# Patient Record
Sex: Female | Born: 1975 | Race: Black or African American | Hispanic: No | Marital: Married | Smoking: Current every day smoker
Health system: Southern US, Community
[De-identification: ages and names within clinical notes are randomized; demographics above are authoritative.]

## PROBLEM LIST (undated history)

## (undated) DIAGNOSIS — Z973 Presence of spectacles and contact lenses: Secondary | ICD-10-CM

## (undated) DIAGNOSIS — E669 Obesity, unspecified: Secondary | ICD-10-CM

## (undated) DIAGNOSIS — R519 Headache, unspecified: Secondary | ICD-10-CM

## (undated) DIAGNOSIS — F909 Attention-deficit hyperactivity disorder, unspecified type: Secondary | ICD-10-CM

## (undated) DIAGNOSIS — M797 Fibromyalgia: Secondary | ICD-10-CM

## (undated) DIAGNOSIS — Z8669 Personal history of other diseases of the nervous system and sense organs: Secondary | ICD-10-CM

## (undated) DIAGNOSIS — K219 Gastro-esophageal reflux disease without esophagitis: Secondary | ICD-10-CM

## (undated) DIAGNOSIS — F419 Anxiety disorder, unspecified: Secondary | ICD-10-CM

## (undated) DIAGNOSIS — L732 Hidradenitis suppurativa: Secondary | ICD-10-CM

## (undated) DIAGNOSIS — E119 Type 2 diabetes mellitus without complications: Secondary | ICD-10-CM

## (undated) DIAGNOSIS — E039 Hypothyroidism, unspecified: Secondary | ICD-10-CM

## (undated) DIAGNOSIS — E785 Hyperlipidemia, unspecified: Secondary | ICD-10-CM

## (undated) DIAGNOSIS — F32A Depression, unspecified: Secondary | ICD-10-CM

## (undated) DIAGNOSIS — M1990 Unspecified osteoarthritis, unspecified site: Secondary | ICD-10-CM

## (undated) DIAGNOSIS — G4730 Sleep apnea, unspecified: Secondary | ICD-10-CM

## (undated) DIAGNOSIS — I10 Essential (primary) hypertension: Secondary | ICD-10-CM

## (undated) DIAGNOSIS — D649 Anemia, unspecified: Secondary | ICD-10-CM

## (undated) DIAGNOSIS — F319 Bipolar disorder, unspecified: Secondary | ICD-10-CM

## (undated) HISTORY — PX: CARPAL TUNNEL RELEASE: SHX101

## (undated) HISTORY — PX: TUBAL LIGATION: SHX77

## (undated) HISTORY — PX: RECTOVAGINAL FISTULA CLOSURE: SUR265

## (undated) HISTORY — DX: Sleep apnea, unspecified: G47.30

## (undated) HISTORY — PX: VAGINA SURGERY: SHX829

## (undated) HISTORY — PX: TONSILLECTOMY: SUR1361

## (undated) HISTORY — DX: Bipolar disorder, unspecified: F31.9

## (undated) HISTORY — PX: GANGLION CYST EXCISION: SHX1691

## (undated) HISTORY — DX: Type 2 diabetes mellitus without complications: E11.9

## (undated) HISTORY — PX: ELBOW SURGERY: SHX618

---

## 2006-07-06 HISTORY — PX: TUBAL LIGATION: SHX77

## 2007-04-08 ENCOUNTER — Emergency Department (HOSPITAL_COMMUNITY): Admission: EM | Admit: 2007-04-08 | Discharge: 2007-04-08 | Payer: Self-pay | Admitting: Family Medicine

## 2007-05-03 ENCOUNTER — Emergency Department (HOSPITAL_COMMUNITY): Admission: EM | Admit: 2007-05-03 | Discharge: 2007-05-03 | Payer: Self-pay | Admitting: Emergency Medicine

## 2007-07-11 ENCOUNTER — Emergency Department (HOSPITAL_COMMUNITY): Admission: EM | Admit: 2007-07-11 | Discharge: 2007-07-11 | Payer: Self-pay | Admitting: Family Medicine

## 2007-09-08 ENCOUNTER — Emergency Department (HOSPITAL_COMMUNITY): Admission: EM | Admit: 2007-09-08 | Discharge: 2007-09-08 | Payer: Self-pay | Admitting: Emergency Medicine

## 2008-06-12 ENCOUNTER — Ambulatory Visit (HOSPITAL_BASED_OUTPATIENT_CLINIC_OR_DEPARTMENT_OTHER): Admission: RE | Admit: 2008-06-12 | Discharge: 2008-06-12 | Payer: Self-pay | Admitting: Orthopedic Surgery

## 2008-06-26 ENCOUNTER — Ambulatory Visit (HOSPITAL_BASED_OUTPATIENT_CLINIC_OR_DEPARTMENT_OTHER): Admission: RE | Admit: 2008-06-26 | Discharge: 2008-06-26 | Payer: Self-pay | Admitting: Orthopedic Surgery

## 2008-07-19 ENCOUNTER — Encounter: Admission: RE | Admit: 2008-07-19 | Discharge: 2008-09-20 | Payer: Self-pay | Admitting: Orthopedic Surgery

## 2008-11-19 ENCOUNTER — Emergency Department (HOSPITAL_COMMUNITY): Admission: EM | Admit: 2008-11-19 | Discharge: 2008-11-19 | Payer: Self-pay | Admitting: Emergency Medicine

## 2008-12-13 ENCOUNTER — Emergency Department (HOSPITAL_COMMUNITY): Admission: EM | Admit: 2008-12-13 | Discharge: 2008-12-13 | Payer: Self-pay | Admitting: Emergency Medicine

## 2009-02-20 ENCOUNTER — Encounter: Admission: RE | Admit: 2009-02-20 | Discharge: 2009-02-20 | Payer: Self-pay | Admitting: Neurology

## 2009-04-01 ENCOUNTER — Encounter: Payer: Self-pay | Admitting: Pulmonary Disease

## 2009-05-17 ENCOUNTER — Encounter: Payer: Self-pay | Admitting: Pulmonary Disease

## 2009-05-17 ENCOUNTER — Ambulatory Visit (HOSPITAL_BASED_OUTPATIENT_CLINIC_OR_DEPARTMENT_OTHER): Admission: RE | Admit: 2009-05-17 | Discharge: 2009-05-17 | Payer: Self-pay | Admitting: Neurology

## 2009-05-27 ENCOUNTER — Ambulatory Visit: Payer: Self-pay | Admitting: Pulmonary Disease

## 2009-06-20 DIAGNOSIS — G5600 Carpal tunnel syndrome, unspecified upper limb: Secondary | ICD-10-CM | POA: Insufficient documentation

## 2009-06-20 DIAGNOSIS — R51 Headache: Secondary | ICD-10-CM | POA: Insufficient documentation

## 2009-06-20 DIAGNOSIS — G5710 Meralgia paresthetica, unspecified lower limb: Secondary | ICD-10-CM | POA: Insufficient documentation

## 2009-06-20 DIAGNOSIS — I10 Essential (primary) hypertension: Secondary | ICD-10-CM | POA: Insufficient documentation

## 2009-06-20 DIAGNOSIS — R519 Headache, unspecified: Secondary | ICD-10-CM | POA: Insufficient documentation

## 2009-06-20 DIAGNOSIS — E669 Obesity, unspecified: Secondary | ICD-10-CM | POA: Insufficient documentation

## 2009-06-20 DIAGNOSIS — IMO0001 Reserved for inherently not codable concepts without codable children: Secondary | ICD-10-CM | POA: Insufficient documentation

## 2009-06-21 ENCOUNTER — Ambulatory Visit: Payer: Self-pay | Admitting: Pulmonary Disease

## 2009-06-21 DIAGNOSIS — F518 Other sleep disorders not due to a substance or known physiological condition: Secondary | ICD-10-CM | POA: Insufficient documentation

## 2009-06-21 DIAGNOSIS — F319 Bipolar disorder, unspecified: Secondary | ICD-10-CM | POA: Insufficient documentation

## 2009-06-21 DIAGNOSIS — E119 Type 2 diabetes mellitus without complications: Secondary | ICD-10-CM

## 2009-06-21 DIAGNOSIS — G4700 Insomnia, unspecified: Secondary | ICD-10-CM | POA: Insufficient documentation

## 2009-06-21 DIAGNOSIS — E039 Hypothyroidism, unspecified: Secondary | ICD-10-CM | POA: Insufficient documentation

## 2009-06-21 DIAGNOSIS — G4733 Obstructive sleep apnea (adult) (pediatric): Secondary | ICD-10-CM | POA: Insufficient documentation

## 2009-06-21 DIAGNOSIS — E1142 Type 2 diabetes mellitus with diabetic polyneuropathy: Secondary | ICD-10-CM | POA: Insufficient documentation

## 2009-08-26 ENCOUNTER — Emergency Department (HOSPITAL_COMMUNITY): Admission: EM | Admit: 2009-08-26 | Discharge: 2009-08-26 | Payer: Self-pay | Admitting: Emergency Medicine

## 2009-09-07 ENCOUNTER — Emergency Department (HOSPITAL_COMMUNITY): Admission: EM | Admit: 2009-09-07 | Discharge: 2009-09-07 | Payer: Self-pay | Admitting: Emergency Medicine

## 2009-11-01 ENCOUNTER — Emergency Department (HOSPITAL_COMMUNITY): Admission: EM | Admit: 2009-11-01 | Discharge: 2009-11-01 | Payer: Self-pay | Admitting: Family Medicine

## 2009-12-26 ENCOUNTER — Emergency Department (HOSPITAL_COMMUNITY): Admission: EM | Admit: 2009-12-26 | Discharge: 2009-12-26 | Payer: Self-pay | Admitting: Emergency Medicine

## 2010-01-28 ENCOUNTER — Emergency Department (HOSPITAL_COMMUNITY): Admission: EM | Admit: 2010-01-28 | Discharge: 2010-01-28 | Payer: Self-pay | Admitting: Emergency Medicine

## 2010-04-07 ENCOUNTER — Emergency Department (HOSPITAL_COMMUNITY): Admission: EM | Admit: 2010-04-07 | Discharge: 2010-04-07 | Payer: Self-pay | Admitting: Emergency Medicine

## 2010-04-07 ENCOUNTER — Emergency Department (HOSPITAL_COMMUNITY): Admission: EM | Admit: 2010-04-07 | Discharge: 2010-04-07 | Payer: Self-pay | Admitting: Family Medicine

## 2010-06-13 ENCOUNTER — Encounter
Admission: RE | Admit: 2010-06-13 | Discharge: 2010-06-13 | Payer: Self-pay | Source: Home / Self Care | Attending: Family Medicine | Admitting: Family Medicine

## 2010-07-17 ENCOUNTER — Emergency Department (HOSPITAL_COMMUNITY)
Admission: EM | Admit: 2010-07-17 | Discharge: 2010-07-17 | Payer: Self-pay | Source: Home / Self Care | Admitting: Emergency Medicine

## 2010-07-22 ENCOUNTER — Emergency Department (HOSPITAL_COMMUNITY)
Admission: EM | Admit: 2010-07-22 | Discharge: 2010-07-22 | Payer: Self-pay | Source: Home / Self Care | Admitting: Emergency Medicine

## 2010-08-04 ENCOUNTER — Other Ambulatory Visit: Payer: Self-pay | Admitting: Internal Medicine

## 2010-08-04 DIAGNOSIS — E041 Nontoxic single thyroid nodule: Secondary | ICD-10-CM

## 2010-08-12 ENCOUNTER — Other Ambulatory Visit (HOSPITAL_COMMUNITY)
Admission: RE | Admit: 2010-08-12 | Discharge: 2010-08-12 | Disposition: A | Discharge status: Home / Self Care | Payer: Medicaid Other | Source: Ambulatory Visit | Attending: Interventional Radiology | Admitting: Interventional Radiology

## 2010-08-12 ENCOUNTER — Ambulatory Visit
Admission: RE | Admit: 2010-08-12 | Discharge: 2010-08-12 | Disposition: A | Discharge status: Home / Self Care | Payer: Medicaid Other | Source: Ambulatory Visit | Attending: Internal Medicine | Admitting: Internal Medicine

## 2010-08-12 ENCOUNTER — Other Ambulatory Visit: Payer: Self-pay | Admitting: Interventional Radiology

## 2010-08-12 DIAGNOSIS — E041 Nontoxic single thyroid nodule: Secondary | ICD-10-CM

## 2010-08-12 DIAGNOSIS — E049 Nontoxic goiter, unspecified: Secondary | ICD-10-CM | POA: Insufficient documentation

## 2010-09-01 ENCOUNTER — Other Ambulatory Visit: Payer: Self-pay | Admitting: Neurology

## 2010-09-01 DIAGNOSIS — R51 Headache: Secondary | ICD-10-CM

## 2010-09-01 DIAGNOSIS — R209 Unspecified disturbances of skin sensation: Secondary | ICD-10-CM

## 2010-09-01 DIAGNOSIS — H538 Other visual disturbances: Secondary | ICD-10-CM

## 2010-09-10 ENCOUNTER — Ambulatory Visit
Admission: RE | Admit: 2010-09-10 | Discharge: 2010-09-10 | Disposition: A | Discharge status: Home / Self Care | Payer: Medicaid Other | Source: Ambulatory Visit | Attending: Neurology | Admitting: Neurology

## 2010-09-10 DIAGNOSIS — H538 Other visual disturbances: Secondary | ICD-10-CM

## 2010-09-10 DIAGNOSIS — R209 Unspecified disturbances of skin sensation: Secondary | ICD-10-CM

## 2010-09-10 DIAGNOSIS — R51 Headache: Secondary | ICD-10-CM

## 2010-10-13 LAB — POCT I-STAT, CHEM 8
Calcium, Ion: 1.11 mmol/L — ABNORMAL LOW (ref 1.12–1.32)
Glucose, Bld: 88 mg/dL (ref 70–99)
HCT: 35 % — ABNORMAL LOW (ref 36.0–46.0)
Hemoglobin: 11.9 g/dL — ABNORMAL LOW (ref 12.0–15.0)
TCO2: 25 mmol/L (ref 0–100)

## 2010-11-18 NOTE — Op Note (Signed)
Ruth [name], Ruth [name]            ACCOUNT NO.:  [id number]   MEDICAL RECORD NO.:  [id number]          PATIENT TYPE:  AMB   LOCATION:  [facility]                         FACILITY:  [facility]   PHYSICIAN:  Deidre Ala, M.D.    DATE OF BIRTH:  08/03/1975   DATE OF PROCEDURE:  06/12/2008  DATE OF DISCHARGE:                               OPERATIVE REPORT   PREOPERATIVE DIAGNOSES:  1. 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  2. Chronic medial epicondylitis.   POSTOPERATIVE DIAGNOSES:  1. 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  2. Chronic medial epicondylitis.   PROCEDURE:  1. Left elbow Jobe procedure with partial medial epicondylectomy and      repair of common flexor mass.  2. Left elbow neurolysis of ulnar nerve at cubital tunnel.   SURGEON:  1. Charlesetta Shanks, M.D.   ASSISTANT:  Phineas Semen, PA-C.   ANESTHESIA:  General with LMA.   CULTURES:  None.   DRAINS:  None.   ESTIMATED BLOOD LOSS:  Minimal.   TOURNIQUET TIME:  Thirty minutes.   PATHOLOGIC FINDINGS AND HISTORY:  Fani was sent by Dr. Bruna Potter  complaining of bilateral wrist and forearm pain, right worse than left.  We felt at first she had bilateral carpal tunnel syndrome, tendinitis of  the right hand and radial tunnel syndrome bilaterally.  We sent her to  Dr. Regino Schultze for nerve conduction studies which showed abnormalities in the  left ulnar nerve at the elbow, right radial mononeuropathy at the  posterior interosseous area for radial tunnel and a right median  mononeuropathy with compression at the wrist.  The left medial sensory  conduction was normal, although there was slightly delayed distal  latency.  She was persistently having discomfort.  She wanted to do the  surgery on the left side.  She wanted to wait to do the right side until  her mother with here on vacation to help around the house.  She  therefore elected to proceed because of marked tenderness at the  medial  epicondyle and the ulnar nerve findings to do the left elbow first.  Two  reasons for doing the medial epicondylectomy are in my algorithm, I  believe that the medial epicondyle is often the entrapping agent and  that medial epicondylectomy decompresses the nerve without having to do  an anterior transposition or submuscular transposition which I think  devascularizes the nerve.  In addition, she was exquisitely tender over  the common flexor mass and, therefore, this combined with a Jobe  technique, release of the common flexor mass, excision of scar tissue  and epicondylectomy to freshen the bed and then repair somewhat similar  to a Nirschl type procedure.  The nerve was pinched at its classic  entrance into the cubital tunnel.  Epineurectomy was carried out to  release it.  It was released proximal and distal, as well as the medial  intermuscular septum.  Care was taken to protect the medial antebrachial  cutaneous nerves.  The nerve was free and not tethered in anyway after  the epicondylectomy partial and repair with  flexion and extension of the  elbow.  No excessive stripping of the vascularity to the nerve was  undertaken, only straightforward neurolysis and medial epineurectomy.   DESCRIPTION OF PROCEDURE:  With adequate anesthesia obtained using LMA  technique, the patient was placed in a supine position and the left  upper extremity was prepped from the fingertips to the tourniquet in the  standard fashion.  After standard prepping and draping, Esmarch  examination was used and the tourniquet was let up to 300 mmHg.  An  incision was then made over the medial epicondyle slightly curvilinear.  Incision deepened sharply with a knife and hemostasis obtained using the  Bovie electrocoagulator.  Under loupe magnification dissection was  carried down through the subcutaneous tissue to the fascia.  The fascia  over the nerve was released in the cubital tunnel proximal and  distally  and careful neurolysis carried out with release of the medial  intermuscular septum and all tight bands around the nerve, including the  epineurium well up into the arm and distally into the flexor mass.  I  then incised around the soft tissues over top of the common flexor mass  at the medial epicondyle, reflected it up and off, retracted it, then  did a medial epicondylectomy partial using a curved osteotome and  rongeurs.  When I felt it had been adequately decompressed so as not to  catch the nerve and to freshen the base of cortical bone,  irrigation  was carried out.  The common flexor mass was repaired by interrupted  figure-of-eight #0 Vicryl sutures, repairing the common flexor mass back  to the medial epicondyle remaining.  The nerve was then checked through  range of motion.  Irrigation was carried out.  The wound was then closed  in layers with 2-0 and 3-0 Vicryl in the subcu and skin staples.  A  bulky sterile compressive dressing was applied without plaster with  sling.  The patient having tolerated the procedure well was awakened and  taken to the recovery room in satisfactory condition to be discharged  per outpatient routine.  Given Percocet for pain.  Told call the office  for an appointment this weekend.  She was given a sling.  Laboratory  data within normal limits.        Deidre Ala, M.D.  Electronically Signed     VEP/MEDQ  D:  06/12/2008  T:  06/12/2008  Job:  [id number]   cc:   Burtis Junes, MD   [facility]  [address]  [place], [place] [address]

## 2010-11-18 NOTE — Op Note (Signed)
Ruth [name], Ruth [name]            ACCOUNT NO.:  [id number]   MEDICAL RECORD NO.:  [id number]          PATIENT TYPE:  AMB   LOCATION:  [facility]                         [facility]ACILITY:  [facility]   PHYSICIAN:  Deidre Ala, M.D.    DATE OF BIRTH:  05-14-1976   DATE OF PROCEDURE:  06/26/2008  DATE OF DISCHARGE:                               OPERATIVE REPORT   PREOPERATIVE DIAGNOSES:  1. Right forearm radial tunnel syndrome compression.  2. Right wrist dorsal ganglion cyst confirmed by ultrasound.  3. Right carpal tunnel syndrome.   POSTOPERATIVE DIAGNOSES:  1. Right radial tunnel compression syndrome.  2. Right dorsal wrist ganglion cyst with hemorrhage and intra-      articular component, classic position.  3. Extensor tenosynovitis, third and fourth dorsal compartments.  4. Right carpal tunnel syndrome.   PROCEDURE:  1. Right elbow radial tunnel release with technique of Mariella Saa  2. Excision of right dorsal wrist ganglion cyst with intra-articular      component and capsular excision and cautery.  3. Extensor tenosynovectomy, third and fourth dorsal compartments.  4. Right carpal tunnel release.   SURGEON:  1. Charlesetta Shanks, M.D.   ASSISTANT:  Phineas Semen, P.A.   ANESTHESIA:  General with LMA   CULTURES:  None.   DRAINS:  None.   ESTIMATED BLOOD LOSS:  Minimal.   TOURNIQUET TIME:  1 hour 30 minutes.   SPECIMEN:  Right wrist ganglion with hemorrhage.   PATHOLOGIC FINDINGS AND HISTORY:  When Ruth [name] first presented  to [place] May 01, 2007, with Dr. Bruna Potter, she was complaining of  bilateral wrist pain and it was shooting going up her arm, right worse  than left.  She also had pain over the right radial dorsal aspect of the  wrist.  We examined her and there was a mild prominence over the dorsal  surface of the wrist which was very tender, not a discrete palpable mass  at that point. She did have Tinel's sign over the right radial tunnel  and we had findings of  carpal tunnel syndrome.  Our initial impression  was bilateral carpal tunnel syndrome, tendinitis dorsal surface of the  right hand and radial tunnel syndrome.  We sent her to Dr. Modesto Charon for  nerve conduction studies and what it showed was right radial  mononeuropathy with compression at or above the elbow and posterior  interosseous neuropathy or radial tunnel syndrome and borderline right  median mononeuropathy with compression at the wrist.  She also had ulnar  nerve neuropathy on the left side and proceeded with that surgical  procedure to release it on June 12, 2008 and did well with that. She  then presented for procedure on the right side.  We had planned to do a  radial tunnel release and carpal tunnel release.  She again complained  of her wrist.  We did an ultrasound in the office that was read by  radiology at [facility] as well as our ultrasonographer referral and they felt  it was consistent with a ganglion cyst of the wrist.  At surgery on the  radial tunnel, she  had a classic compression of the nerve just past the  arcade of Frohse and leash of Sherilyn Cooter in the proximal superficial head of  the supinator. She had a very, very thick muscular forearm and it was  extremely tight.  This released well, releasing the whole superficial  head of the supinator and the overlying ER CV was quite tight and that  was also released on the dorsal aspect.  This released the nerve well.  At the distal wrist, she had fairly extensive tenosynovitis of the third  and fourth compartments which may have been what was showing up on the  ultrasound scan.  She did indeed have a pea-size ganglion in the classic  position off the capsule at the bald head of the capitate in the classic  a dorsal capsular position.  It had multiple lobes and had hemorrhage  and then deep to that were several intra-articular deep blebs.  This was  excised along with a postage stamp size area of capsule to prevent  recurrence and  this was cauterized.  The carpal tunnel was extremely  tight.  She is a very meaty palm and we did release it well, well up to  the distal forearm, releasing all branches including the motor branch.   PROCEDURE:  With adequate anesthesia obtained using LMA technique,  vancomycin given IV prophylaxis due to penicillin allergy.  The patient  was placed in the supine position.  The right upper extremity was  prepped from the fingertips to the tourniquet in standard fashion. After  standard prepping and draping, Esmarch exsanguination was used. The  tourniquet was let up to 300 mmHg.  We then started four fingerbreadths  from the lateral epicondyle and made our incision along the dorsal  aspect of the forearm in the area of the radial tunnel in the manner of  Mariella Saa.  Incision was deepened sharply with a knife and hemostasis  obtained using the Bovie electrocoagulator.  Under loupe magnification,  dissection was carried down to the extensor musculature and the fascia  was incised longitudinally.  We then finger dissected down to the  supinator. Retractors were placed. I then dissected the supinator  retrograde, coming upon the nerve and releasing it, passed proximally  the arcade of Frohse and the leash of Sherilyn Cooter, decompressing the nerve.  I  further released it distally, releasing the whole superficial head of  the supinator.  I also released the leading edge of the extensor carpi  radialis brevis on top which was extremely tight.  Irrigation was  carried out. That wound was closed in layers with running locking 0  Vicryl on the fascia, 2-0 and 3-0 Vicryl on the subcuticular, and 4-0  nylon on the skin.  Attention was then turned to the dorsal wrist, where  an incision was made where she was palpably tender with palpable mass  effect on the radial border of the distal radius.  Dissection was  carried down to the third and fourth compartment intervals with tendons  identified.   Tenosynovitis was excised.  I then further dissected down  to the capsule and located the ganglion cyst and removed it as well as  the intra-articular component.  The capsulectomy edges were then  cauterized to prevent recurrence.  Irrigation was carried out and that  wound was closed with 3-0 Vicryl and 4-0 nylon.  We then turned the hand  over and made the classic carpal tunnel incision in mid palm and thenar  flexion crease longitudinally  from proximal one-third palm to the distal  wrist flexion crease. Incision was deepened sharply with a knife and  hemostasis obtained using the Bovie electrocoagulator.  Dissection was  carried down through to the palmar fascia.  I then placed a Freer  elevator to protect the nerve and cut down upon the transverse carpal  ligament and the fascia with a 64 Beaver blade.  This exposed the nerve.  I then released the nerve well up into the distal forearm on the ulnar  side using a Sewall retractor.  All branches were traced distally  including the motor branch and the epineurium was released.  Irrigation  was carried out and that wound was closed with interrupted vertical  mattress sutures of 4-0 nylon.  A bulky sterile compressive dressing was  applied with volar plaster splint with 7+ gauzes to effect a partial  hand dressing.  We then  placed gauze and Webril on the forearm with Ace and a sling.  The  patient then having tolerated procedure well was awakened, taken to the  recovery room in satisfactory condition to be discharged per outpatient  routine, to keep it elevated and given Percocet for pain and told to  come to the office for recheck next Tuesday.      Deidre Ala, M.D.  Electronically Signed     VEP/MEDQ  D:  06/26/2008  T:  06/26/2008  Job:  [id number]   cc:   A.V. Bruna Potter, MD

## 2011-01-14 ENCOUNTER — Inpatient Hospital Stay (INDEPENDENT_AMBULATORY_CARE_PROVIDER_SITE_OTHER)
Admission: RE | Admit: 2011-01-14 | Discharge: 2011-01-14 | Disposition: A | Discharge status: Home / Self Care | Payer: Medicaid Other | Source: Ambulatory Visit | Attending: Family Medicine | Admitting: Family Medicine

## 2011-01-14 DIAGNOSIS — N3090 Cystitis, unspecified without hematuria: Secondary | ICD-10-CM

## 2011-01-14 LAB — POCT URINALYSIS DIP (DEVICE)
Nitrite: NEGATIVE
Protein, ur: NEGATIVE mg/dL

## 2011-03-22 ENCOUNTER — Emergency Department (HOSPITAL_COMMUNITY)
Admission: EM | Admit: 2011-03-22 | Discharge: 2011-03-22 | Disposition: A | Discharge status: Home / Self Care | Payer: Medicaid Other | Attending: Emergency Medicine | Admitting: Emergency Medicine

## 2011-03-22 DIAGNOSIS — E119 Type 2 diabetes mellitus without complications: Secondary | ICD-10-CM | POA: Insufficient documentation

## 2011-03-22 DIAGNOSIS — IMO0001 Reserved for inherently not codable concepts without codable children: Secondary | ICD-10-CM | POA: Insufficient documentation

## 2011-03-22 DIAGNOSIS — Z79899 Other long term (current) drug therapy: Secondary | ICD-10-CM | POA: Insufficient documentation

## 2011-03-22 DIAGNOSIS — E039 Hypothyroidism, unspecified: Secondary | ICD-10-CM | POA: Insufficient documentation

## 2011-03-22 DIAGNOSIS — F319 Bipolar disorder, unspecified: Secondary | ICD-10-CM | POA: Insufficient documentation

## 2011-03-22 DIAGNOSIS — I10 Essential (primary) hypertension: Secondary | ICD-10-CM | POA: Insufficient documentation

## 2011-03-22 DIAGNOSIS — Z87891 Personal history of nicotine dependence: Secondary | ICD-10-CM | POA: Insufficient documentation

## 2011-03-22 DIAGNOSIS — E7800 Pure hypercholesterolemia, unspecified: Secondary | ICD-10-CM | POA: Insufficient documentation

## 2011-03-22 DIAGNOSIS — R1084 Generalized abdominal pain: Secondary | ICD-10-CM | POA: Insufficient documentation

## 2011-03-22 LAB — URINALYSIS, ROUTINE W REFLEX MICROSCOPIC
Glucose, UA: NEGATIVE mg/dL
Ketones, ur: NEGATIVE mg/dL
Nitrite: NEGATIVE
pH: 7.5 (ref 5.0–8.0)

## 2011-03-22 LAB — URINE MICROSCOPIC-ADD ON

## 2011-03-22 LAB — POCT PREGNANCY, URINE: Preg Test, Ur: NEGATIVE

## 2011-03-26 ENCOUNTER — Encounter (HOSPITAL_COMMUNITY): Payer: Self-pay | Admitting: Radiology

## 2011-03-26 ENCOUNTER — Emergency Department (HOSPITAL_COMMUNITY)
Admission: EM | Admit: 2011-03-26 | Discharge: 2011-03-26 | Disposition: A | Discharge status: Home / Self Care | Payer: Medicaid Other | Attending: Emergency Medicine | Admitting: Emergency Medicine

## 2011-03-26 ENCOUNTER — Emergency Department (HOSPITAL_COMMUNITY): Payer: Medicaid Other

## 2011-03-26 DIAGNOSIS — E7800 Pure hypercholesterolemia, unspecified: Secondary | ICD-10-CM | POA: Insufficient documentation

## 2011-03-26 DIAGNOSIS — R1310 Dysphagia, unspecified: Secondary | ICD-10-CM | POA: Insufficient documentation

## 2011-03-26 DIAGNOSIS — E119 Type 2 diabetes mellitus without complications: Secondary | ICD-10-CM | POA: Insufficient documentation

## 2011-03-26 DIAGNOSIS — F319 Bipolar disorder, unspecified: Secondary | ICD-10-CM | POA: Insufficient documentation

## 2011-03-26 DIAGNOSIS — J029 Acute pharyngitis, unspecified: Secondary | ICD-10-CM | POA: Insufficient documentation

## 2011-03-26 DIAGNOSIS — H9209 Otalgia, unspecified ear: Secondary | ICD-10-CM | POA: Insufficient documentation

## 2011-03-26 DIAGNOSIS — I10 Essential (primary) hypertension: Secondary | ICD-10-CM | POA: Insufficient documentation

## 2011-03-26 DIAGNOSIS — R51 Headache: Secondary | ICD-10-CM | POA: Insufficient documentation

## 2011-03-26 DIAGNOSIS — E039 Hypothyroidism, unspecified: Secondary | ICD-10-CM | POA: Insufficient documentation

## 2011-03-26 DIAGNOSIS — M542 Cervicalgia: Secondary | ICD-10-CM | POA: Insufficient documentation

## 2011-03-26 DIAGNOSIS — R209 Unspecified disturbances of skin sensation: Secondary | ICD-10-CM | POA: Insufficient documentation

## 2011-03-26 HISTORY — DX: Fibromyalgia: M79.7

## 2011-03-26 HISTORY — DX: Essential (primary) hypertension: I10

## 2011-03-26 LAB — POCT I-STAT, CHEM 8
BUN: 8 mg/dL (ref 6–23)
Calcium, Ion: 1.18 mmol/L (ref 1.12–1.32)
Creatinine, Ser: 0.7 mg/dL (ref 0.50–1.10)
Hemoglobin: 12.9 g/dL (ref 12.0–15.0)
Sodium: 143 mEq/L (ref 135–145)
TCO2: 25 mmol/L (ref 0–100)

## 2011-03-26 LAB — POCT URINALYSIS DIP (DEVICE)
Bilirubin Urine: NEGATIVE
Glucose, UA: NEGATIVE
Specific Gravity, Urine: 1.025
Urobilinogen, UA: 0.2
pH: 5.5

## 2011-03-26 LAB — GC/CHLAMYDIA PROBE AMP, GENITAL: GC Probe Amp, Genital: NEGATIVE

## 2011-03-26 LAB — WET PREP, GENITAL
Clue Cells Wet Prep HPF POC: NONE SEEN
Trich, Wet Prep: NONE SEEN

## 2011-03-26 LAB — POCT PREGNANCY, URINE: Preg Test, Ur: NEGATIVE

## 2011-03-26 MED ORDER — IOHEXOL 300 MG/ML  SOLN
75.0000 mL | Freq: Once | INTRAMUSCULAR | Status: AC | PRN
  Administered 2011-03-26: 75 mL via INTRAVENOUS

## 2011-03-30 LAB — I-STAT 8, (EC8 V) (CONVERTED LAB)
Acid-Base Excess: 2
BUN: 10
Chloride: 109
Chloride: 110
Glucose, Bld: 102 — ABNORMAL HIGH
HCT: 38
Hemoglobin: 12.9
Operator id: 288331
Potassium: 4.2
Potassium: 4.3
Sodium: 141
Sodium: 141
TCO2: 27
TCO2: 27
pH, Ven: 7.443 — ABNORMAL HIGH
pH, Ven: 7.446 — ABNORMAL HIGH

## 2011-04-10 LAB — BASIC METABOLIC PANEL
BUN: 15 mg/dL (ref 6–23)
Calcium: 9.3 mg/dL (ref 8.4–10.5)
Chloride: 105 mEq/L (ref 96–112)
Creatinine, Ser: 0.69 mg/dL (ref 0.4–1.2)

## 2011-04-10 LAB — GLUCOSE, CAPILLARY
Glucose-Capillary: 118 mg/dL — ABNORMAL HIGH (ref 70–99)
Glucose-Capillary: 147 mg/dL — ABNORMAL HIGH (ref 70–99)

## 2011-04-10 LAB — POCT HEMOGLOBIN-HEMACUE: Hemoglobin: 12.4 g/dL (ref 12.0–15.0)

## 2011-04-16 LAB — CULTURE, ROUTINE-ABSCESS: Culture: NO GROWTH

## 2011-07-14 ENCOUNTER — Encounter (HOSPITAL_COMMUNITY): Payer: Self-pay | Admitting: *Deleted

## 2011-07-14 ENCOUNTER — Emergency Department (HOSPITAL_COMMUNITY)
Admission: EM | Admit: 2011-07-14 | Discharge: 2011-07-14 | Disposition: A | Discharge status: Home / Self Care | Payer: Medicaid Other | Attending: Emergency Medicine | Admitting: Emergency Medicine

## 2011-07-14 DIAGNOSIS — I10 Essential (primary) hypertension: Secondary | ICD-10-CM | POA: Insufficient documentation

## 2011-07-14 DIAGNOSIS — F17200 Nicotine dependence, unspecified, uncomplicated: Secondary | ICD-10-CM | POA: Insufficient documentation

## 2011-07-14 DIAGNOSIS — L299 Pruritus, unspecified: Secondary | ICD-10-CM | POA: Insufficient documentation

## 2011-07-14 DIAGNOSIS — B372 Candidiasis of skin and nail: Secondary | ICD-10-CM

## 2011-07-14 DIAGNOSIS — J45909 Unspecified asthma, uncomplicated: Secondary | ICD-10-CM | POA: Insufficient documentation

## 2011-07-14 DIAGNOSIS — F319 Bipolar disorder, unspecified: Secondary | ICD-10-CM | POA: Insufficient documentation

## 2011-07-14 DIAGNOSIS — E119 Type 2 diabetes mellitus without complications: Secondary | ICD-10-CM | POA: Insufficient documentation

## 2011-07-14 DIAGNOSIS — IMO0002 Reserved for concepts with insufficient information to code with codable children: Secondary | ICD-10-CM | POA: Insufficient documentation

## 2011-07-14 DIAGNOSIS — L02412 Cutaneous abscess of left axilla: Secondary | ICD-10-CM

## 2011-07-14 MED ORDER — FLUCONAZOLE 150 MG PO TABS
150.0000 mg | ORAL_TABLET | ORAL | Status: AC
  Administered 2011-07-14: 150 mg via ORAL
  Filled 2011-07-14: qty 1

## 2011-07-14 MED ORDER — CLOTRIMAZOLE 1 % EX CREA: TOPICAL_CREAM | CUTANEOUS | Status: DC

## 2011-07-14 MED ORDER — HYDROCODONE-ACETAMINOPHEN 5-325 MG PO TABS
2.0000 | ORAL_TABLET | Freq: Once | ORAL | Status: AC
  Administered 2011-07-14: 2 via ORAL
  Filled 2011-07-14: qty 2

## 2011-07-14 MED ORDER — SULFAMETHOXAZOLE-TRIMETHOPRIM 800-160 MG PO TABS: 2.0000 | ORAL_TABLET | Freq: Two times a day (BID) | ORAL | Status: AC

## 2011-07-14 MED ORDER — HYDROCODONE-ACETAMINOPHEN 5-325 MG PO TABS
1.0000 | ORAL_TABLET | Freq: Four times a day (QID) | ORAL | Status: AC | PRN
Start: 2011-07-14 — End: 2011-07-24

## 2011-07-14 NOTE — ED Notes (Signed)
Pt given extra supplies for dressing changes at home.  

## 2011-07-14 NOTE — ED Notes (Signed)
Pt with large abscess under left axilla. Pt reports several day hx with worsening pain and increase in size. On exam pt with noted drainage. Pt reports she has been using warm compresses at home. Denies any fevers. Surrounding area warm to the touch. Pt placed in gown and updated on poc.

## 2011-07-14 NOTE — ED Notes (Signed)
PA at bedside to perform I&D

## 2011-07-14 NOTE — ED Notes (Signed)
Pt reports several day hx of abscess under left axillary. Pt reports placing warm compresses without any relief, reports no drainage.

## 2011-07-14 NOTE — ED Provider Notes (Signed)
History     CSN: [id number]  Arrival date & time 07/14/11  5621   First MD Initiated Contact with Patient 07/14/11 [phone]      Chief Complaint  Patient presents with  . Abscess    (Consider location/radiation/quality/duration/timing/severity/associated sxs/prior treatment) HPI Comments: Patient reports abscess to left axilla x several days with a small amount of purulent drainage.  States she gets frequent abscesses and has had multiple I&Ds.  Also, notes smaller abscess between her breasts , as well as an itchy skin, infection under her breasts.  Denies any fevers.  States she has never been diagnosed with hidradenitis suppurativa.  Patient has a past medical history significant for diabetes.  The history is provided by the patient.    Past Medical History  Diagnosis Date  . Diabetes mellitus   . Hypertension   . Asthma   . Bipolar affective disorder   . Fibromyalgia   . Hx of tonsillectomy     History reviewed. No pertinent past surgical history.  History reviewed. No pertinent family history.  History  Substance Use Topics  . Smoking status: Current Everyday Smoker  . Smokeless tobacco: Not on file  . Alcohol Use: No    OB History    Grav Para Term Preterm Abortions TAB SAB Ect Mult Living                  Review of Systems  All other systems reviewed and are negative.    Allergies  Penicillins  Home Medications  No current outpatient prescriptions on file.  There were no vitals taken for this visit.  Physical Exam  Nursing note and vitals reviewed. Constitutional: She is oriented to person, place, and time. She appears well-developed and well-nourished. No distress.  HENT:  Head: Normocephalic and atraumatic.  Neck: Neck supple.  Pulmonary/Chest: Effort normal. No respiratory distress.  Musculoskeletal: Normal range of motion.  Neurological: She is alert and oriented to person, place, and time.  Skin: She is not diaphoretic.       Left axilla  with large area of induration, small opening with purulent drainage.  Smaller scared area under right breast (medially), no fluctuance, no erythema, no warmth.  Skin under left breast flaking and moist - appearance of candidal infection.      ED Course  Procedures (including critical care time)  Labs Reviewed - No data to display No results found.  INCISION AND DRAINAGE Performed by: Rise Patience supervising PA student Consent: Verbal consent obtained. Risks and benefits: risks, benefits and alternatives were discussed Type: abscess  Body area: left axilla  Anesthesia: local infiltration  Local anesthetic: lidocaine 2% no epinephrine  Anesthetic total: 6 ml  Complexity: complex Blunt dissection, attempt to break up loculations  Drainage: bloody  Drainage amount: small   Packing material: none  Patient tolerance: Patient tolerated the procedure well with no immediate complications.     1. Abscess of left axilla   2. Candidal skin infection       MDM  Patient with hx of multiple abscesses, prior I&D in same location.  Possible hidradenitis suppurativa.  I&D attempted in ED with very little purulent drainage.  Patient started on antibiotic, instructions for warm moist compresses to encourage drainage, return for worsening condition.  Pt verbalizes understanding.  I have also given patient surgery referral for long term management.          Rise Patience, [place] 07/14/11 1135

## 2011-07-17 NOTE — ED Provider Notes (Signed)
History     CSN: [id number]  Arrival date & time 07/14/11  7829   First MD Initiated Contact with Patient 07/14/11 [phone]      Chief Complaint  Patient presents with  . Abscess    (Consider location/radiation/quality/duration/timing/severity/associated sxs/prior treatment) HPI  Past Medical History  Diagnosis Date  . Diabetes mellitus   . Hypertension   . Asthma   . Bipolar affective disorder   . Fibromyalgia   . Hx of tonsillectomy     History reviewed. No pertinent past surgical history.  History reviewed. No pertinent family history.  History  Substance Use Topics  . Smoking status: Current Everyday Smoker  . Smokeless tobacco: Not on file  . Alcohol Use: No    OB History    Grav Para Term Preterm Abortions TAB SAB Ect Mult Living                  Review of Systems  Allergies  Penicillins  Home Medications   Current Outpatient Rx  Name Route Sig Dispense Refill  . CLOTRIMAZOLE 1 % EX CREA  Apply to affected area 2 times daily 15 g 0  . HYDROCODONE-ACETAMINOPHEN 5-325 MG PO TABS Oral Take 1 tablet by mouth every 6 (six) hours as needed for pain. 15 tablet 0  . SULFAMETHOXAZOLE-TRIMETHOPRIM 800-160 MG PO TABS Oral Take 2 tablets by mouth 2 (two) times daily. 28 tablet 0    BP 141/78  Pulse 89  Temp(Src) 98.9 F (37.2 C) (Oral)  Resp 17  SpO2 98%  Physical Exam  ED Course  Procedures (including critical care time)  Labs Reviewed - No data to display No results found.   1. Abscess of left axilla   2. Candidal skin infection       MDM  Medical screening examination/treatment/procedure(s) were performed by non-physician practitioner and as supervising physician I was immediately available for consultation/collaboration.        Loren Racer, MD 07/17/11 2009

## 2011-07-18 NOTE — ED Provider Notes (Signed)
Medical screening examination/treatment/procedure(s) were performed by non-physician practitioner and as supervising physician I was immediately available for consultation/collaboration.  Maryhelen Lindler, MD 07/18/11 1629 

## 2012-01-06 ENCOUNTER — Encounter (HOSPITAL_COMMUNITY): Payer: Self-pay | Admitting: Emergency Medicine

## 2012-01-06 ENCOUNTER — Emergency Department (HOSPITAL_COMMUNITY)
Admission: EM | Admit: 2012-01-06 | Discharge: 2012-01-07 | Disposition: A | Discharge status: Home / Self Care | Payer: Medicaid Other | Attending: Emergency Medicine | Admitting: Emergency Medicine

## 2012-01-06 DIAGNOSIS — E119 Type 2 diabetes mellitus without complications: Secondary | ICD-10-CM | POA: Insufficient documentation

## 2012-01-06 DIAGNOSIS — R45851 Suicidal ideations: Secondary | ICD-10-CM | POA: Insufficient documentation

## 2012-01-06 DIAGNOSIS — J45909 Unspecified asthma, uncomplicated: Secondary | ICD-10-CM | POA: Insufficient documentation

## 2012-01-06 DIAGNOSIS — F32A Depression, unspecified: Secondary | ICD-10-CM

## 2012-01-06 DIAGNOSIS — E669 Obesity, unspecified: Secondary | ICD-10-CM | POA: Insufficient documentation

## 2012-01-06 DIAGNOSIS — F319 Bipolar disorder, unspecified: Secondary | ICD-10-CM | POA: Insufficient documentation

## 2012-01-06 DIAGNOSIS — F17200 Nicotine dependence, unspecified, uncomplicated: Secondary | ICD-10-CM | POA: Insufficient documentation

## 2012-01-06 DIAGNOSIS — I10 Essential (primary) hypertension: Secondary | ICD-10-CM | POA: Insufficient documentation

## 2012-01-06 DIAGNOSIS — F329 Major depressive disorder, single episode, unspecified: Secondary | ICD-10-CM

## 2012-01-06 LAB — CBC WITH DIFFERENTIAL/PLATELET
Basophils Absolute: 0 10*3/uL (ref 0.0–0.1)
Eosinophils Absolute: 0.1 10*3/uL (ref 0.0–0.7)
Eosinophils Relative: 2 % (ref 0–5)
MCH: 25.6 pg — ABNORMAL LOW (ref 26.0–34.0)
MCV: 77.5 fL — ABNORMAL LOW (ref 78.0–100.0)
Platelets: 341 10*3/uL (ref 150–400)
RDW: 15.8 % — ABNORMAL HIGH (ref 11.5–15.5)
WBC: 6.5 10*3/uL (ref 4.0–10.5)

## 2012-01-06 LAB — URINALYSIS, ROUTINE W REFLEX MICROSCOPIC
Bilirubin Urine: NEGATIVE
Hgb urine dipstick: NEGATIVE
Nitrite: NEGATIVE
Specific Gravity, Urine: 1.019 (ref 1.005–1.030)
pH: 6 (ref 5.0–8.0)

## 2012-01-06 LAB — COMPREHENSIVE METABOLIC PANEL
ALT: 10 U/L (ref 0–35)
AST: 12 U/L (ref 0–37)
Albumin: 4.2 g/dL (ref 3.5–5.2)
Calcium: 9.5 mg/dL (ref 8.4–10.5)
Sodium: 136 mEq/L (ref 135–145)
Total Protein: 8.3 g/dL (ref 6.0–8.3)

## 2012-01-06 LAB — RAPID URINE DRUG SCREEN, HOSP PERFORMED
Cocaine: NOT DETECTED
Opiates: NOT DETECTED
Tetrahydrocannabinol: POSITIVE — AB

## 2012-01-06 LAB — URINE MICROSCOPIC-ADD ON

## 2012-01-06 MED ORDER — IBUPROFEN 200 MG PO TABS: 600.0000 mg | ORAL_TABLET | Freq: Three times a day (TID) | ORAL | Status: DC | PRN

## 2012-01-06 MED ORDER — ZIPRASIDONE MESYLATE 20 MG IM SOLR
20.0000 mg | Freq: Once | INTRAMUSCULAR | Status: AC
  Administered 2012-01-06: 20 mg via INTRAMUSCULAR
  Filled 2012-01-06: qty 20

## 2012-01-06 MED ORDER — ONDANSETRON HCL 4 MG PO TABS: 4.0000 mg | ORAL_TABLET | Freq: Three times a day (TID) | ORAL | Status: DC | PRN

## 2012-01-06 MED ORDER — ALUM & MAG HYDROXIDE-SIMETH 200-200-20 MG/5ML PO SUSP: 30.0000 mL | ORAL | Status: DC | PRN

## 2012-01-06 MED ORDER — LORAZEPAM 1 MG PO TABS: 1.0000 mg | ORAL_TABLET | Freq: Three times a day (TID) | ORAL | Status: DC | PRN

## 2012-01-06 MED ORDER — ZOLPIDEM TARTRATE 5 MG PO TABS: 5.0000 mg | ORAL_TABLET | Freq: Every evening | ORAL | Status: DC | PRN

## 2012-01-06 MED ORDER — ACETAMINOPHEN 325 MG PO TABS: 650.0000 mg | ORAL_TABLET | ORAL | Status: DC | PRN

## 2012-01-06 NOTE — ED Notes (Addendum)
Pt irritable and agitated wanting to talk to the doctor. Pt states that she has been here since 1500 and just wants to go home with her kids. Dr. Rubin Payor made aware of pt's concerns. Explained to pt that she is here on suicide precautions and has to be cleared by a psychiatrist before she is allowed to go home. Pt states that this what they do in the psyh ward, they hold you here and make you take medications. Pt states she is trying to stay calm so she does not have to take any more medications but now knowing what is going on is making her more frustrated.

## 2012-01-06 NOTE — ED Notes (Signed)
Pt is wants to know what is taking so long and what the hold up is. Pt states she wants to go home and is unable to contact her kids or boyfriend. Explained to the pt that the MD will be contacted and notified of her concerns.

## 2012-01-06 NOTE — ED Notes (Signed)
Unsuccessfully attempted to obtain blood for labs.RN made aware.Main lab phlebotomist contacted and will attempt.

## 2012-01-06 NOTE — ED Notes (Signed)
Pt encouraged by nurse, charge nurse and GDP to comply with plan of care but pt refused repeatedly. Pt made an attempt to leave triage and was stopped by police and she laid down on floor and began screaming. Pt walked back to room own her own and yelled at staff. Pt was taken to TCU and given Geodon. GDP at bedside.

## 2012-01-06 NOTE — ED Notes (Signed)
AC notified re need of sitter

## 2012-01-06 NOTE — ED Notes (Signed)
Pt refusing to get into scrubs. [employer] outside of room.

## 2012-01-06 NOTE — ED Provider Notes (Signed)
History     CSN: [id number]  Arrival date & time 01/06/12  1551   First MD Initiated Contact with Patient 01/06/12 1601      No chief complaint on file.  Level V caveat due to uncooperativeness. (Consider location/radiation/quality/duration/timing/severity/associated sxs/prior treatment) The history is provided by the patient.   patient was brought in by [facility] under involuntary commitment for suicidal ideation. She reportedly had verbalized suicidal thoughts to her Child psychotherapist. Social worker called [employer] in involuntarily committed her. Patient's been uncooperative me. She states she just wants to leave. She states that the thoughts over not being able to pay bills but that has resolved. Patient would not cooperate in the ER and requiring Geodon.  Past Medical History  Diagnosis Date  . Diabetes mellitus   . Hypertension   . Asthma   . Bipolar affective disorder   . Fibromyalgia   . Hx of tonsillectomy     History reviewed. No pertinent past surgical history.  No family history on file.  History  Substance Use Topics  . Smoking status: Current Everyday Smoker  . Smokeless tobacco: Not on file  . Alcohol Use: No    OB History    Grav Para Term Preterm Abortions TAB SAB Ect Mult Living                  Review of Systems  Unable to perform ROS: Psychiatric disorder    Allergies  Penicillins  Home Medications  No current outpatient prescriptions on file.  BP 148/84  Pulse 94  Temp 98 F (36.7 C) (Oral)  SpO2 100%  LMP 12/18/2011  Physical Exam  Constitutional: She appears well-developed and well-nourished.       Patient is obese.  HENT:  Head: Normocephalic.  Eyes: Pupils are equal, round, and reactive to light.  Cardiovascular: Normal rate.   Pulmonary/Chest: Effort normal and breath sounds normal.  Abdominal: There is no tenderness.  Musculoskeletal: Normal range of motion.  Neurological: She is alert.    ED Course  Procedures (including critical  care time)  Labs Reviewed  CBC WITH DIFFERENTIAL - Abnormal; Notable for the following:    Hemoglobin 11.7 (*)     HCT 35.4 (*)     MCV 77.5 (*)     MCH 25.6 (*)     RDW 15.8 (*)     All other components within normal limits  URINALYSIS, ROUTINE W REFLEX MICROSCOPIC - Abnormal; Notable for the following:    Ketones, ur TRACE (*)     Leukocytes, UA SMALL (*)     All other components within normal limits  URINE RAPID DRUG SCREEN (HOSP PERFORMED) - Abnormal; Notable for the following:    Tetrahydrocannabinol POSITIVE (*)     All other components within normal limits  URINE MICROSCOPIC-ADD ON - Abnormal; Notable for the following:    Squamous Epithelial / LPF FEW (*)     Bacteria, UA FEW (*)     All other components within normal limits  COMPREHENSIVE METABOLIC PANEL  ETHANOL  PREGNANCY, URINE   No results found.   1. Depression       MDM  Patient with depression and suicidal statements. She was involuntarily committed by her Child psychotherapist. lab work is overall reassuring here. She became belligerent and required sedation. She'll get a tele psychiatry evaluation.        Juliet Rude. Rubin Payor, MD 01/06/12 2356

## 2012-01-06 NOTE — ED Notes (Signed)
Pt brought in by [employer]. Reported that pt had verbalized SI to a Child psychotherapist. Social worker called [employer] fearing that pt would harm self. Pt has not verbalized SI/HI to [employer].

## 2012-01-06 NOTE — ED Notes (Signed)
Patient refuses to be changed into blue scrubs

## 2012-01-06 NOTE — ED Notes (Signed)
Patient belonging taken by RN and placed in bags. Bags on back of bed charge rn aware

## 2012-01-07 NOTE — ED Provider Notes (Signed)
Tele-psychiatric consultation was completed. Dr. Jacky Kindle, recommended that the patient be discharged. He reversed. The IVC. He encouraged her to followup with community mental health.   Patient is currently alert, and lucid. She expresses no suicidal ideation. She plans on following up at [facility] for psychiatric care.    Flint Melter, MD 01/07/12 [phone]

## 2012-01-29 ENCOUNTER — Emergency Department (HOSPITAL_COMMUNITY)
Admission: EM | Admit: 2012-01-29 | Discharge: 2012-01-29 | Disposition: A | Discharge status: Home Health | Payer: Medicaid Other | Attending: Emergency Medicine | Admitting: Emergency Medicine

## 2012-01-29 ENCOUNTER — Encounter (HOSPITAL_COMMUNITY): Payer: Self-pay

## 2012-01-29 DIAGNOSIS — I10 Essential (primary) hypertension: Secondary | ICD-10-CM | POA: Insufficient documentation

## 2012-01-29 DIAGNOSIS — R05 Cough: Secondary | ICD-10-CM | POA: Insufficient documentation

## 2012-01-29 DIAGNOSIS — F17200 Nicotine dependence, unspecified, uncomplicated: Secondary | ICD-10-CM | POA: Insufficient documentation

## 2012-01-29 DIAGNOSIS — R059 Cough, unspecified: Secondary | ICD-10-CM

## 2012-01-29 DIAGNOSIS — F319 Bipolar disorder, unspecified: Secondary | ICD-10-CM | POA: Insufficient documentation

## 2012-01-29 DIAGNOSIS — E119 Type 2 diabetes mellitus without complications: Secondary | ICD-10-CM | POA: Insufficient documentation

## 2012-01-29 MED ORDER — ALBUTEROL SULFATE HFA 108 (90 BASE) MCG/ACT IN AERS: 1.0000 | INHALATION_SPRAY | Freq: Four times a day (QID) | RESPIRATORY_TRACT | Status: DC | PRN

## 2012-01-29 MED ORDER — HYDROCODONE-HOMATROPINE 5-1.5 MG/5ML PO SYRP: 2.5000 mL | ORAL_SOLUTION | Freq: Three times a day (TID) | ORAL | Status: AC | PRN

## 2012-01-29 MED ORDER — ALBUTEROL SULFATE HFA 108 (90 BASE) MCG/ACT IN AERS
INHALATION_SPRAY | RESPIRATORY_TRACT | Status: AC
  Administered 2012-01-29: 13:00:00
  Filled 2012-01-29: qty 6.7

## 2012-01-29 MED ORDER — AZITHROMYCIN 250 MG PO TABS: 250.0000 mg | ORAL_TABLET | Freq: Every day | ORAL | Status: AC

## 2012-01-29 NOTE — ED Notes (Signed)
Pt complains of dry barking cough, worse at night, no relief with medications otc.

## 2012-01-29 NOTE — ED Provider Notes (Signed)
History   This chart was scribed for Joya Gaskins, MD by Sofie Rower. The patient was seen in room TR09C/TR09C and the patient's care was started at 1:04 PM     CSN: [id number]  Arrival date & time 01/29/12  1247   First MD Initiated Contact with Patient 01/29/12 1252      Chief Complaint  Patient presents with  . Cough     Patient is a 36 y.o. female presenting with cough. The history is provided by the patient. No language interpreter was used.  Cough This is a new problem. The current episode started more than 1 week ago. The problem occurs every few minutes. The problem has not changed since onset.The cough is non-productive. There has been no fever. Associated symptoms include shortness of breath. Pertinent negatives include no chest pain and no sore throat. She has tried decongestants for the symptoms. The treatment provided no relief. She is a smoker.  denies dyspnea on exertion No weakness  PCP is Dr. Bruna Potter.   Past Medical History  Diagnosis Date  . Diabetes mellitus   . Hypertension   . Asthma   . Bipolar affective disorder   . Fibromyalgia   . Hx of tonsillectomy     No past surgical history on file.  No family history on file.  History  Substance Use Topics  . Smoking status: Current Everyday Smoker  . Smokeless tobacco: Not on file  . Alcohol Use: No    OB History    Grav Para Term Preterm Abortions TAB SAB Ect Mult Living                  Review of Systems  HENT: Negative for sore throat.   Respiratory: Positive for cough and shortness of breath. Negative for chest tightness.   Cardiovascular: Negative for chest pain.  Gastrointestinal: Negative for vomiting and diarrhea.    Allergies  Penicillins  Home Medications  No current outpatient prescriptions on file.  BP 131/93  Pulse 102  Temp 97.8 F (36.6 C) (Oral)  Resp 18  SpO2 99%  LMP 12/18/2011  Physical Exam  CONSTITUTIONAL: Well developed/well nourished HEAD AND FACE:  Normocephalic/atraumatic EYES: EOMI/PERRL ENMT: Mucous membranes moist, nasal congestion NECK: supple no meningeal signs SPINE:entire spine nontender CV: S1/S2 noted, no murmurs/rubs/gallops noted LUNGS: Lungs are clear to auscultation bilaterally, no apparent distress ABDOMEN: soft, nontender, no rebound or guarding GU:no cva tenderness NEURO: Pt is awake/alert, moves all extremitiesx4, pt ambulatory and in no distress EXTREMITIES: pulses normal, full ROM SKIN: warm, color normal PSYCH: no abnormalities of mood noted;  ED Course  Procedures  DIAGNOSTIC STUDIES: Oxygen Saturation is 99% on RA, normal by my interpretation.    COORDINATION OF CARE:  1:07PM- EDP at bedside discuss' treatment plan concerning administration of albuterol inhaler,  application of azithromycin antibiotics, cough syrup.  Advised to quit smoking Doubt ACS/PE/CHF at this time    MDM  Nursing notes including past medical history and social history reviewed and considered in documentation      I personally performed the services described in this documentation, which was scribed in my presence. The recorded information has been reviewed and considered.      Joya Gaskins, MD 01/29/12 1332

## 2012-02-16 ENCOUNTER — Encounter (HOSPITAL_COMMUNITY): Payer: Self-pay | Admitting: Emergency Medicine

## 2012-02-16 ENCOUNTER — Emergency Department (HOSPITAL_COMMUNITY)
Admission: EM | Admit: 2012-02-16 | Discharge: 2012-02-16 | Disposition: A | Discharge status: Home / Self Care | Payer: Medicaid Other | Attending: Emergency Medicine | Admitting: Emergency Medicine

## 2012-02-16 DIAGNOSIS — M79609 Pain in unspecified limb: Secondary | ICD-10-CM | POA: Insufficient documentation

## 2012-02-16 DIAGNOSIS — IMO0001 Reserved for inherently not codable concepts without codable children: Secondary | ICD-10-CM | POA: Insufficient documentation

## 2012-02-16 DIAGNOSIS — R600 Localized edema: Secondary | ICD-10-CM

## 2012-02-16 DIAGNOSIS — F17200 Nicotine dependence, unspecified, uncomplicated: Secondary | ICD-10-CM | POA: Insufficient documentation

## 2012-02-16 DIAGNOSIS — F319 Bipolar disorder, unspecified: Secondary | ICD-10-CM | POA: Insufficient documentation

## 2012-02-16 DIAGNOSIS — E119 Type 2 diabetes mellitus without complications: Secondary | ICD-10-CM | POA: Insufficient documentation

## 2012-02-16 DIAGNOSIS — I10 Essential (primary) hypertension: Secondary | ICD-10-CM | POA: Insufficient documentation

## 2012-02-16 LAB — URINALYSIS, ROUTINE W REFLEX MICROSCOPIC
Glucose, UA: NEGATIVE mg/dL
Ketones, ur: NEGATIVE mg/dL
Leukocytes, UA: NEGATIVE
Nitrite: NEGATIVE
Protein, ur: NEGATIVE mg/dL
Urobilinogen, UA: 1 mg/dL (ref 0.0–1.0)

## 2012-02-16 LAB — POCT I-STAT, CHEM 8
Calcium, Ion: 1.17 mmol/L (ref 1.12–1.23)
Glucose, Bld: 98 mg/dL (ref 70–99)
HCT: 33 % — ABNORMAL LOW (ref 36.0–46.0)
Hemoglobin: 11.2 g/dL — ABNORMAL LOW (ref 12.0–15.0)
TCO2: 24 mmol/L (ref 0–100)

## 2012-02-16 LAB — URINE MICROSCOPIC-ADD ON

## 2012-02-16 LAB — POCT PREGNANCY, URINE: Preg Test, Ur: NEGATIVE

## 2012-02-16 MED ORDER — HYDROCHLOROTHIAZIDE 25 MG PO TABS: 25.0000 mg | ORAL_TABLET | Freq: Every day | ORAL | Status: DC

## 2012-02-16 MED ORDER — HYDROCODONE-ACETAMINOPHEN 5-500 MG PO TABS: 1.0000 | ORAL_TABLET | Freq: Four times a day (QID) | ORAL | Status: AC | PRN

## 2012-02-16 NOTE — ED Provider Notes (Signed)
Medical screening examination/treatment/procedure(s) were performed by non-physician practitioner and as supervising physician I was immediately available for consultation/collaboration.   Rolan Bucco, MD 02/16/12 1944

## 2012-02-16 NOTE — ED Notes (Signed)
Advised of wait time. 

## 2012-02-16 NOTE — ED Provider Notes (Signed)
History     CSN: [id number]  Arrival date & time 02/16/12  1201   First MD Initiated Contact with Patient 02/16/12 1810      Chief Complaint  Patient presents with  . Leg Swelling    (Consider location/radiation/quality/duration/timing/severity/associated sxs/prior treatment) Patient is a 36 y.o. female presenting with leg pain. The history is provided by the patient. No language interpreter was used.  Leg Pain  The incident occurred more than 1 week ago. The incident occurred at home. There was no injury mechanism. The pain is present in the right thigh. The quality of the pain is described as aching. The pain is at a severity of 4/10. The pain is mild. The pain has been intermittent since onset. Associated symptoms include numbness. Pertinent negatives include no inability to bear weight, no loss of motion, no muscle weakness, no loss of sensation and no tingling. She reports no foreign bodies present.   Patient her c/o bilateral lower extremity edema as usual but when she went to bed last pm and woke up the swelling was still there.  She is also having R thigh pain/numbness x 6 months.  1+bilateral pitting edema.  No calf pain, sob, or long trips.   Past Medical History  Diagnosis Date  . Diabetes mellitus   . Hypertension   . Asthma   . Bipolar affective disorder   . Fibromyalgia   . Hx of tonsillectomy     History reviewed. No pertinent past surgical history.  History reviewed. No pertinent family history.  History  Substance Use Topics  . Smoking status: Current Everyday Smoker  . Smokeless tobacco: Not on file  . Alcohol Use: No    OB History    Grav Para Term Preterm Abortions TAB SAB Ect Mult Living                  Review of Systems  Constitutional: Negative.   HENT: Negative.   Eyes: Negative.   Respiratory: Negative.   Cardiovascular: Negative.   Gastrointestinal: Negative.   Genitourinary: Negative for dysuria, urgency, enuresis, difficulty urinating  and dyspareunia.  Skin:       LE edema  Neurological: Positive for numbness. Negative for dizziness, tingling, speech difficulty, weakness and headaches.  Psychiatric/Behavioral: Negative.   All other systems reviewed and are negative.    Allergies  Penicillins  Home Medications   Current Outpatient Rx  Name Route Sig Dispense Refill  . ALBUTEROL SULFATE HFA 108 (90 BASE) MCG/ACT IN AERS Inhalation Inhale 1-2 puffs into the lungs every 6 (six) hours as needed for wheezing. 1 Inhaler 0    BP 152/109  Pulse 90  Temp 97.5 F (36.4 C) (Oral)  Resp 18  SpO2 100%  Physical Exam  Nursing note and vitals reviewed. Constitutional: She is oriented to person, place, and time. She appears well-developed and well-nourished.  HENT:  Head: Normocephalic and atraumatic.  Eyes: Conjunctivae and EOM are normal. Pupils are equal, round, and reactive to light.  Neck: Normal range of motion. Neck supple.  Cardiovascular: Normal rate.   Pulmonary/Chest: Effort normal and breath sounds normal. No respiratory distress.  Abdominal: Soft.  Musculoskeletal: Normal range of motion. She exhibits tenderness. She exhibits no edema.       Bilateral lower paraspinal pain numbness/tenderness to R lateral thigh  Neurological: She is alert and oriented to person, place, and time. She has normal reflexes. No cranial nerve deficit.  Skin: Skin is warm and dry.  Psychiatric: She has a  normal mood and affect.    ED Course  Procedures (including critical care time)  Labs Reviewed  URINALYSIS, ROUTINE W REFLEX MICROSCOPIC - Abnormal; Notable for the following:    Hgb urine dipstick SMALL (*)     All other components within normal limits  POCT I-STAT, CHEM 8 - Abnormal; Notable for the following:    Hemoglobin 11.2 (*)     HCT 33.0 (*)     All other components within normal limits  POCT PREGNANCY, URINE  URINE MICROSCOPIC-ADD ON   No results found.   No diagnosis found.    MDM  LE edema x  months and stopped taking her hctz 3 months ago.  Lower back pain with R thigh pain x 5 months.  rx for hctz and lortab.  Follow up this week with pcp.  Return for severe pain or any concerns.          Remi Haggard, NP 02/16/12 1919

## 2012-02-16 NOTE — ED Notes (Signed)
The patient is AOx4 and comfortable with her discharge instructions. 

## 2012-02-16 NOTE — ED Notes (Signed)
Pt c/o bilateral leg swelling that did not go down per norm last night; pt sts not taking BP or DM meds x 1 year

## 2012-03-26 ENCOUNTER — Emergency Department (HOSPITAL_COMMUNITY): Payer: Medicaid Other

## 2012-03-26 ENCOUNTER — Encounter (HOSPITAL_COMMUNITY): Payer: Self-pay | Admitting: Physical Medicine and Rehabilitation

## 2012-03-26 ENCOUNTER — Emergency Department (HOSPITAL_COMMUNITY)
Admission: EM | Admit: 2012-03-26 | Discharge: 2012-03-26 | Disposition: A | Discharge status: Home / Self Care | Payer: Medicaid Other | Attending: Emergency Medicine | Admitting: Emergency Medicine

## 2012-03-26 DIAGNOSIS — R059 Cough, unspecified: Secondary | ICD-10-CM

## 2012-03-26 DIAGNOSIS — R05 Cough: Secondary | ICD-10-CM | POA: Insufficient documentation

## 2012-03-26 DIAGNOSIS — M7730 Calcaneal spur, unspecified foot: Secondary | ICD-10-CM | POA: Insufficient documentation

## 2012-03-26 DIAGNOSIS — E119 Type 2 diabetes mellitus without complications: Secondary | ICD-10-CM | POA: Insufficient documentation

## 2012-03-26 DIAGNOSIS — I10 Essential (primary) hypertension: Secondary | ICD-10-CM | POA: Insufficient documentation

## 2012-03-26 MED ORDER — HYDROCOD POLST-CHLORPHEN POLST 10-8 MG/5ML PO LQCR: 10.0000 mL | Freq: Every evening | ORAL | Status: DC | PRN

## 2012-03-26 MED ORDER — BENZONATATE 200 MG PO CAPS: 200.0000 mg | ORAL_CAPSULE | Freq: Two times a day (BID) | ORAL | Status: DC | PRN

## 2012-03-26 MED ORDER — BENZONATATE 100 MG PO CAPS
200.0000 mg | ORAL_CAPSULE | Freq: Once | ORAL | Status: AC
  Administered 2012-03-26: 200 mg via ORAL
  Filled 2012-03-26: qty 2

## 2012-03-26 NOTE — ED Notes (Signed)
Patient C/O pain in her right foot.  Pain is located on the back of her right foot at the base of her Achilies tendon. Area is tender to palpation.  No swelling noted.  Patient denies injury.  Patient also C/O having a cough for 2 weeks.

## 2012-03-26 NOTE — ED Provider Notes (Signed)
History  This chart was scribed for Cyndra Numbers, MD by Shari Heritage. The patient was seen in room TR05C/TR05C. Patient's care was started at 1448.     CSN: [id number]  Arrival date & time 03/26/12  1423   First MD Initiated Contact with Patient 03/26/12 1448      Chief Complaint  Patient presents with  . Cough  . Ankle Pain     The history is provided by the patient. No language interpreter was used.    Ruth [name] is a 36 y.o. female who presents to the Emergency Department complaining of a non-productive cough onset 2 weeks ago. Patient states that the cough is worse during the day. She has never vomited from severe coughing. Patient says that it takes her awhile to catch her breath after coughing spells. Patient denies fever, cough, nasal congestion, wheezing, rhinorrhea, nausea, vomiting or diarrhea. She has been using Hall's for cough relief. She has a refill left for Albuterol.  Patient is also complaining of moderate, constant, non-radiating right ankle pain onset 1 week ago. She rates the pain as 7/10. She says pain makes it difficult for her to ambulate. She has no history of heel spurs. She denies any obvious injury or trauma. She has taken Aleve with minimal relief. Patient is ambulatory.  Patient has a history of asthma. She says that she began having asthma related symptoms again recently. Other medical history includes diabetes, HTN, bipolar affective disorder and fibromyalgia. She is a current smoker.   Past Medical History  Diagnosis Date  . Diabetes mellitus   . Hypertension   . Asthma   . Bipolar affective disorder   . Fibromyalgia   . Hx of tonsillectomy     History reviewed. No pertinent past surgical history.  History reviewed. No pertinent family history.  History  Substance Use Topics  . Smoking status: Current Every Day Smoker  . Smokeless tobacco: Not on file  . Alcohol Use: No    OB History    Grav Para Term Preterm Abortions TAB SAB  Ect Mult Living                  Review of Systems  Constitutional: Negative for fever.  HENT: Negative for congestion and rhinorrhea.   Respiratory: Positive for cough. Negative for wheezing.   Cardiovascular: Negative.   Gastrointestinal: Negative for nausea, vomiting and diarrhea.  Genitourinary: Negative.   Musculoskeletal: Positive for myalgias.  Skin: Negative.   Neurological: Negative.   Psychiatric/Behavioral: Negative.     Allergies  Penicillins  Home Medications   Current Outpatient Rx  Name Route Sig Dispense Refill  . ALBUTEROL SULFATE HFA 108 (90 BASE) MCG/ACT IN AERS Inhalation Inhale 1-2 puffs into the lungs every 6 (six) hours as needed for wheezing. 1 Inhaler 0    BP 132/94  Pulse 108  Temp 98.2 F (36.8 C) (Oral)  Resp 22  SpO2 99%  Physical Exam  Nursing note and vitals reviewed. GEN: Well-developed, well-nourished female in no acute distress HEENT: Atraumatic, normocephalic. Oropharynx clear without erythema NECK: Trachea midline, no meningismus CV: regular rate and rhythm. PULM: No respiratory distress. No wheezing, rales or rhonchii.  GU: deferred Neuro: cranial nerves grossly 2-12 intact, no abnormalities of strength or sensation, A and O x 3 MSK: Tenderness to palpation over right posterior heel. No obvious deformity. Right Achilles tendon is intact. Otherwise normal. Skin: No rashes petechiae, purpura, or jaundice. No skin lesions. Psych: no abnormality of mood  ED Course  Procedures (including critical care time) DIAGNOSTIC STUDIES: Oxygen Saturation is 99% on room air, normal by my interpretation.    COORDINATION OF CARE: 3:02pm- Patient informed of current plan for treatment and evaluation and agrees with plan at this time.   Labs Reviewed - No data to display  Dg Chest 2 View  03/26/2012  *RADIOLOGY REPORT*  Clinical Data: Cough, congestion  CHEST - 2 VIEW  Comparison: 06/13/2010  Findings: Cardiomediastinal silhouette is  stable.  No acute infiltrate or pleural effusion.  No pulmonary edema.  Bony thorax is stable.  Central mild bronchitic changes again noted.  IMPRESSION: No acute infiltrate or pulmonary edema.  Again noted central mild bronchitic changes.   Original Report Authenticated By: Natasha Mead, M.D.    Dg Foot Complete Right  03/26/2012  *RADIOLOGY REPORT*  Clinical Data: Right foot pain calcaneus region  RIGHT FOOT COMPLETE - 3+ VIEW  Comparison: None.  Findings: Three views of the right foot submitted.  No acute fracture or subluxation.  Tiny plantar spur of the calcaneus. There is posterior spurring of the calcaneus at the Achilles tendon insertion.  IMPRESSION: No acute fracture or subluxation.  Plantar and posterior spurring of the calcaneus.   Original Report Authenticated By: Natasha Mead, M.D.      1. Cough   2. Heel spur       MDM  Patient had Achilles tendon in tact with acute heel pain and no injury.  She also complained of persistent cough.  Patient advised to stop smoking.  Lung exam unremarkable and patient given tessalon perles.  CXR to confirm no PNA reviewed by myself and negative.  Patient with albuterol refills and not wheezing here.  Given Rx for tussionex and tessalon perles.  Patient given referral to podiatry for heel spur as well as ACE wrap at her request.  PAtient declined crutches.  Tussionex will also help with heel pain at night.  Patient discharged in good condition.      I personally performed the services described in this documentation, which was scribed in my presence. The recorded information has been reviewed and considered.      Cyndra Numbers, MD 03/26/12 [phone]

## 2012-03-26 NOTE — Progress Notes (Signed)
Orthopedic Tech Progress Note Patient Details:  Ruth [name] [birth year] [id number]  Ortho Devices Type of Ortho Device: Ace wrap Ortho Device/Splint Location: fight heel and foot Ortho Device/Splint Interventions: Application   Sokha Craker 03/26/2012, 4:44 PM

## 2012-03-26 NOTE — Progress Notes (Signed)
Orthopedic Tech Progress Note Patient Details:  Ruth [name] 1976/06/05 [id number]  Patient ID: [name], female   DOB: [birth year], 36 y.o.   MRN: [id number] Viewed order from doctor's order list  [name] 03/26/2012, 4:44 PM

## 2012-03-26 NOTE — ED Notes (Signed)
Paged ortho 

## 2012-03-26 NOTE — ED Notes (Signed)
Pt presents to department for evaluation of dry cough and R ankle pain. Ongoing x1 week. Denies recent injury. 6/10 pain at the time. Ambulatory to triage. Respirations unlabored. No signs of distress noted.

## 2012-05-04 ENCOUNTER — Encounter (HOSPITAL_COMMUNITY): Payer: Self-pay

## 2012-05-04 ENCOUNTER — Emergency Department (HOSPITAL_COMMUNITY)
Admission: EM | Admit: 2012-05-04 | Discharge: 2012-05-04 | Disposition: A | Discharge status: Home / Self Care | Payer: Medicaid Other | Attending: Emergency Medicine | Admitting: Emergency Medicine

## 2012-05-04 ENCOUNTER — Emergency Department (HOSPITAL_COMMUNITY): Payer: Medicaid Other

## 2012-05-04 DIAGNOSIS — F319 Bipolar disorder, unspecified: Secondary | ICD-10-CM | POA: Insufficient documentation

## 2012-05-04 DIAGNOSIS — S335XXA Sprain of ligaments of lumbar spine, initial encounter: Secondary | ICD-10-CM | POA: Insufficient documentation

## 2012-05-04 DIAGNOSIS — J45909 Unspecified asthma, uncomplicated: Secondary | ICD-10-CM | POA: Insufficient documentation

## 2012-05-04 DIAGNOSIS — F17200 Nicotine dependence, unspecified, uncomplicated: Secondary | ICD-10-CM | POA: Insufficient documentation

## 2012-05-04 DIAGNOSIS — I10 Essential (primary) hypertension: Secondary | ICD-10-CM | POA: Insufficient documentation

## 2012-05-04 DIAGNOSIS — Y929 Unspecified place or not applicable: Secondary | ICD-10-CM | POA: Insufficient documentation

## 2012-05-04 DIAGNOSIS — X58XXXA Exposure to other specified factors, initial encounter: Secondary | ICD-10-CM | POA: Insufficient documentation

## 2012-05-04 DIAGNOSIS — E119 Type 2 diabetes mellitus without complications: Secondary | ICD-10-CM | POA: Insufficient documentation

## 2012-05-04 DIAGNOSIS — S39012A Strain of muscle, fascia and tendon of lower back, initial encounter: Secondary | ICD-10-CM

## 2012-05-04 DIAGNOSIS — Z8739 Personal history of other diseases of the musculoskeletal system and connective tissue: Secondary | ICD-10-CM | POA: Insufficient documentation

## 2012-05-04 DIAGNOSIS — Y939 Activity, unspecified: Secondary | ICD-10-CM | POA: Insufficient documentation

## 2012-05-04 DIAGNOSIS — R209 Unspecified disturbances of skin sensation: Secondary | ICD-10-CM | POA: Insufficient documentation

## 2012-05-04 LAB — POCT PREGNANCY, URINE: Preg Test, Ur: NEGATIVE

## 2012-05-04 MED ORDER — ORPHENADRINE CITRATE ER 100 MG PO TB12: 100.0000 mg | ORAL_TABLET | Freq: Two times a day (BID) | ORAL | Status: DC

## 2012-05-04 MED ORDER — OXYCODONE-ACETAMINOPHEN 5-325 MG PO TABS
1.0000 | ORAL_TABLET | Freq: Once | ORAL | Status: AC
  Administered 2012-05-04: 1 via ORAL
  Filled 2012-05-04: qty 1

## 2012-05-04 MED ORDER — IBUPROFEN 600 MG PO TABS: 600.0000 mg | ORAL_TABLET | Freq: Four times a day (QID) | ORAL | Status: DC | PRN

## 2012-05-04 MED ORDER — IBUPROFEN 400 MG PO TABS
800.0000 mg | ORAL_TABLET | Freq: Once | ORAL | Status: AC
  Administered 2012-05-04: 800 mg via ORAL
  Filled 2012-05-04: qty 2

## 2012-05-04 NOTE — ED Notes (Signed)
Pt complains of 1 week hx of back pain and buttock pain started in buttock and now with right leg pain.

## 2012-05-04 NOTE — ED Notes (Signed)
Patient advised that we need urine.  Patient claims cannot give urine specimen at this time.

## 2012-05-04 NOTE — ED Provider Notes (Signed)
History   This chart was scribed for Tobin Chad, MD by Albertha Ghee Rifaie. This patient was seen in room TR06C/TR06C and the patient's care was started at 12:34PM.    CSN: [id number]  Arrival date & time 05/04/12  1206   None     Chief Complaint  Patient presents with  . Back Pain     The history is provided by the patient. No language interpreter was used.   Ruth [name] is a 36 y.o. female who presents to the Emergency Department complaining of one week, gradually worsening, gradual onset, constant back pain that radiates to the right leg. The pain the leg is described as numbness. Pain is aggravated with movement and is alleviated with laying down. Pt states having hx of neck pain and was seen here for it. She reports not being able to refill her medication due to their cost. Pt denies fever, SOB, vomiting, diarrhea, abnormal BM, and nausea. She states having a hx of asthma, hypertension, and diabetes. Pt is a current everyday smoker but denies alcohol use.     Past Medical History  Diagnosis Date  . Diabetes mellitus   . Hypertension   . Asthma   . Bipolar affective disorder   . Fibromyalgia   . Hx of tonsillectomy     No past surgical history on file.  No family history on file.  History  Substance Use Topics  . Smoking status: Current Every Day Smoker  . Smokeless tobacco: Not on file  . Alcohol Use: No   No OB history provided  Review of Systems  All other systems reviewed and are negative.    Allergies  Penicillins  Home Medications   Current Outpatient Rx  Name Route Sig Dispense Refill  . ALBUTEROL SULFATE HFA 108 (90 BASE) MCG/ACT IN AERS Inhalation Inhale 1-2 puffs into the lungs every 6 (six) hours as needed for wheezing. 1 Inhaler 0    BP 141/92  Pulse 94  Temp 98.4 F (36.9 C) (Oral)  Resp 18  SpO2 98%  Physical Exam  Nursing note and vitals reviewed. Constitutional: She is oriented to person, place, and time. She appears  well-developed and well-nourished. No distress.       obese  HENT:  Head: Normocephalic and atraumatic.  Right Ear: External ear normal.  Left Ear: External ear normal.  Nose: Nose normal.  Mouth/Throat: Oropharynx is clear and moist.  Eyes: Conjunctivae normal and EOM are normal. Pupils are equal, round, and reactive to light. Right eye exhibits no discharge. Left eye exhibits no discharge. No scleral icterus.  Neck: Normal range of motion. Neck supple. No JVD present. No tracheal deviation present.  Cardiovascular: Normal rate, regular rhythm, normal heart sounds and intact distal pulses.  Exam reveals no gallop and no friction rub.   No murmur heard. Pulmonary/Chest: Effort normal and breath sounds normal. No stridor. No respiratory distress. She has no wheezes. She has no rales. She exhibits no tenderness.  Abdominal: Soft. Bowel sounds are normal. She exhibits no distension and no mass. There is no tenderness. There is no rebound and no guarding.  Musculoskeletal: She exhibits no edema and no tenderness.       Lumbar back: She exhibits tenderness, swelling and spasm. She exhibits normal range of motion, no bony tenderness, no edema and no deformity.       Diffuse lower back tenderness without specific midline point tenderness. No step-offs appreciated. Range of motion appears grossly intact with some discomfort  with flexion and rotation at the waist.  Lymphadenopathy:    She has no cervical adenopathy.  Neurological: She is alert and oriented to person, place, and time. She displays normal reflexes. She exhibits normal muscle tone. Coordination normal.       Negative straight leg raise bilaterally. She is able to raise both legs greater than 30 she has some discomfort when raising the right leg. Negative Babinski.   Skin: Skin is warm. No rash noted. She is not diaphoretic. No erythema. No pallor.  Psychiatric: She has a normal mood and affect. Her behavior is normal.    ED Course    Procedures (including critical care time)  Labs Reviewed - No data to display No results found.  DIAGNOSTIC STUDIES: Oxygen Saturation is 98% on room air, normal by my interpretation.    COORDINATION OF CARE: 12:42 PM Discussed treatment plan with pt at bedside and pt agreed to plan.     No diagnosis found.    MDM  Patient presents for evaluation of lower back pain. She reports the pain radiates down into the right leg should this time has no focal neurologic deficits but some vague report of right leg paresthesias. She does report a known history of degenerative disease of the cervical spine. We'll obtain plain films of the lumbar spine and manage her pain. She reports that she is not currently taking any medications however she is supposed to be treated for type 2 diabetes, hypertension, bipolar disorder, hypothyroidism, and some other conditions. She states she hasn't taken her medications because she can no longer than. She does not appear to be concerned about the long-term implications of these conditions being treated. Discussed the long-term risks with her as well as strategies for getting assistance affording her medications.  1505.  Pt stable, NAD.  Note a steady, confident gait.  No bony abnormalities noted on xray.  Encouraged outpt f/u.   I personally performed the services described in this documentation, which was scribed in my presence. The recorded information has been reviewed and considered.        Tobin Chad, MD 05/04/12 [phone]

## 2012-05-04 NOTE — Discharge Instructions (Signed)

## 2012-07-22 ENCOUNTER — Encounter (HOSPITAL_COMMUNITY): Payer: Self-pay | Admitting: Emergency Medicine

## 2012-07-22 ENCOUNTER — Emergency Department (HOSPITAL_COMMUNITY): Payer: Medicaid Other

## 2012-07-22 ENCOUNTER — Emergency Department (HOSPITAL_COMMUNITY)
Admission: EM | Admit: 2012-07-22 | Discharge: 2012-07-22 | Disposition: A | Discharge status: Home / Self Care | Payer: Medicaid Other | Attending: Emergency Medicine | Admitting: Emergency Medicine

## 2012-07-22 DIAGNOSIS — I10 Essential (primary) hypertension: Secondary | ICD-10-CM | POA: Insufficient documentation

## 2012-07-22 DIAGNOSIS — R079 Chest pain, unspecified: Secondary | ICD-10-CM | POA: Insufficient documentation

## 2012-07-22 DIAGNOSIS — F319 Bipolar disorder, unspecified: Secondary | ICD-10-CM | POA: Insufficient documentation

## 2012-07-22 DIAGNOSIS — J45909 Unspecified asthma, uncomplicated: Secondary | ICD-10-CM | POA: Insufficient documentation

## 2012-07-22 DIAGNOSIS — F17200 Nicotine dependence, unspecified, uncomplicated: Secondary | ICD-10-CM | POA: Insufficient documentation

## 2012-07-22 DIAGNOSIS — R209 Unspecified disturbances of skin sensation: Secondary | ICD-10-CM | POA: Insufficient documentation

## 2012-07-22 DIAGNOSIS — Z3202 Encounter for pregnancy test, result negative: Secondary | ICD-10-CM | POA: Insufficient documentation

## 2012-07-22 DIAGNOSIS — Z8739 Personal history of other diseases of the musculoskeletal system and connective tissue: Secondary | ICD-10-CM | POA: Insufficient documentation

## 2012-07-22 DIAGNOSIS — E119 Type 2 diabetes mellitus without complications: Secondary | ICD-10-CM | POA: Insufficient documentation

## 2012-07-22 LAB — BASIC METABOLIC PANEL
Calcium: 9.8 mg/dL (ref 8.4–10.5)
Creatinine, Ser: 0.68 mg/dL (ref 0.50–1.10)
GFR calc Af Amer: >90 mL/min (ref >90)
GFR calc non Af Amer: >90 mL/min (ref >90)

## 2012-07-22 LAB — CBC WITH DIFFERENTIAL/PLATELET
Basophils Absolute: 0 10*3/uL (ref 0.0–0.1)
Basophils Relative: 0 % (ref 0–1)
Eosinophils Relative: 3 % (ref 0–5)
HCT: 35.2 % — ABNORMAL LOW (ref 36.0–46.0)
MCHC: 32.1 g/dL (ref 30.0–36.0)
Monocytes Absolute: 0.2 10*3/uL (ref 0.1–1.0)
Neutro Abs: 3.8 10*3/uL (ref 1.7–7.7)
RDW: 15.9 % — ABNORMAL HIGH (ref 11.5–15.5)

## 2012-07-22 LAB — URINALYSIS, ROUTINE W REFLEX MICROSCOPIC
Ketones, ur: NEGATIVE mg/dL
Leukocytes, UA: NEGATIVE
Nitrite: NEGATIVE
Protein, ur: NEGATIVE mg/dL

## 2012-07-22 NOTE — ED Provider Notes (Signed)
History     CSN: [id number]  Arrival date & time 07/22/12  1245   First MD Initiated Contact with Patient 07/22/12 1732      Chief Complaint  Patient presents with  . Chest Pain    (Consider location/radiation/quality/duration/timing/severity/associated sxs/prior treatment) HPI Comments: 37 y/o F h/o DM p/w chest pain. Onset about 7 hours ago while laying on her left side at home. Left sided chest pain. Sharp/squeezing. Initially severe. Has been improving since then. Still with minimal chest pain. Much worsened with palpation. No diaphoresis or SOB. No fevers or cough. Otherwise been feeling well. No prior cardiac or pulmonary problems. Chronic numbness to all extremities from neuropathy.  Patient is a 37 y.o. female presenting with chest pain. The history is provided by the patient.  Chest Pain The chest pain began 6 - 12 hours ago. Chest pain occurs constantly. The chest pain is improving. Associated with: occurred while laying on left side in bed. At its most intense, the pain is at 10/10. The pain is currently at 3/10. The quality of the pain is described as stabbing and squeezing. Radiates to: associated b/l jaw pain. resolved. Exacerbated by: palpation. Pertinent negatives for primary symptoms include no fever, no shortness of breath, no cough, no wheezing, no palpitations, no abdominal pain, no nausea, no vomiting and no dizziness.  Associated symptoms include numbness (chronic. no change).  Pertinent negatives for associated symptoms include no diaphoresis, no lower extremity edema and no weakness. Treatments tried: tums.  Her past medical history is significant for diabetes.     Past Medical History  Diagnosis Date  . Diabetes mellitus   . Hypertension   . Asthma   . Bipolar affective disorder   . Fibromyalgia   . Hx of tonsillectomy     History reviewed. No pertinent past surgical history.  History reviewed. No pertinent family history.  History  Substance Use  Topics  . Smoking status: Current Every Day Smoker  . Smokeless tobacco: Not on file  . Alcohol Use: No    OB History    Grav Para Term Preterm Abortions TAB SAB Ect Mult Living                  Review of Systems  Constitutional: Negative for fever, diaphoresis, activity change and appetite change.  HENT: Negative for congestion, sore throat, rhinorrhea, neck pain and neck stiffness.   Eyes: Negative for pain and visual disturbance.  Respiratory: Negative for cough, choking, chest tightness, shortness of breath, wheezing and stridor.   Cardiovascular: Positive for chest pain. Negative for palpitations and leg swelling.  Gastrointestinal: Negative for nausea, vomiting, abdominal pain and diarrhea.  Genitourinary: Negative for dysuria, hematuria, flank pain and difficulty urinating.  Musculoskeletal: Negative for back pain.  Skin: Negative for color change and rash.  Neurological: Positive for numbness (chronic. no change). Negative for dizziness, weakness, light-headedness and headaches.    Allergies  Penicillins  Home Medications  No current outpatient prescriptions on file.  BP 134/89  Pulse 92  Temp 98.5 F (36.9 C) (Oral)  Resp 16  SpO2 100%  LMP 06/14/2012  Physical Exam  Constitutional: She is oriented to person, place, and time. She appears well-developed and well-nourished. No distress.       Obese female sitting up in bed. NAD. Speaks in full sentences. No apparent discomfort  HENT:  Head: Normocephalic and atraumatic.  Right Ear: External ear normal.  Left Ear: External ear normal.  Mouth/Throat: Oropharynx is clear and moist.  Eyes: Conjunctivae normal are normal. Pupils are equal, round, and reactive to light.  Neck: Normal range of motion. Neck supple. No tracheal deviation present.  Cardiovascular: Normal rate, regular rhythm, normal heart sounds and intact distal pulses.   Pulmonary/Chest: Effort normal and breath sounds normal. No stridor. No  respiratory distress. She has no wheezes. She has no rales. She exhibits tenderness (over left chest wall).  Abdominal: Soft. She exhibits no distension. There is no tenderness. There is no guarding.  Musculoskeletal: Normal range of motion. She exhibits no edema and no tenderness.  Neurological: She is alert and oriented to person, place, and time.  Skin: Skin is warm and dry.  Psychiatric: She has a normal mood and affect. Her behavior is normal.    ED Course  Procedures (including critical care time)  Labs Reviewed  CBC WITH DIFFERENTIAL - Abnormal; Notable for the following:    Hemoglobin 11.3 (*)     HCT 35.2 (*)     MCV 77.4 (*)     MCH 24.8 (*)     RDW 15.9 (*)     All other components within normal limits  BASIC METABOLIC PANEL  URINALYSIS, ROUTINE W REFLEX MICROSCOPIC  POCT PREGNANCY, URINE  POCT I-STAT TROPONIN I   Dg Chest 2 View  07/22/2012  *RADIOLOGY REPORT*  Clinical Data: Chest pain  CHEST - 2 VIEW  Comparison: 03/26/2012  Findings: The heart size and mediastinal contours are within normal limits.  Both lungs are clear.  The visualized skeletal structures are unremarkable.  IMPRESSION: Negative examination.   Original Report Authenticated By: Signa Kell, M.D.      1. Chest pain      Date: 07/22/2012  Rate: 91  Rhythm: normal sinus rhythm  QRS Axis: normal  Intervals: normal  ST/T Wave abnormalities: normal  Conduction Disutrbances:none  Narrative Interpretation:   Old EKG Reviewed: none available    MDM    37 y/o F p/w chest pain.  Labs and imaging unremarkable. Pain very reproducible on exam.  Likely musculoskeletal chest pain. Onset while laying on her side, reproducible.  Unlikely PNA as CXR negative, no leukocytosis, no cough, no fever Unlikely ACS as troponin negative, EKG unremarkable, low risk (TIMI 0-1) Unlikely PE as atypical presentation, low risk per [facility], Doubt Aortic Dissection, Pancreatitis, Arrhythmia, Pneumothorax,  Endo/Myo/Pericarditis, Shingles, Emergent complications of an Ulcer, Esophageal pathology, or other emergent pathology.   Dc home. Strict return precautions given. Follow up with primary care provider and cardiology. Patient in agreement with plan    Labs and imaging reviewed by myself and considered in medical decision making if ordered. Imaging interpreted by radiology.   Discussed case with Dr. Rubin Payor who is in agreement with assessment and plan.        Stevie Kern, MD 07/23/12 [phone]

## 2012-07-22 NOTE — ED Notes (Signed)
Pt upset that she has not been moved to the back. Explained delay and that I could not give her a time on when she would be moved but we are working on moving patients in the back. Apologized for delay and wait, pt continues to be upset.

## 2012-07-22 NOTE — ED Notes (Signed)
Pt c/o left sided CP with radiation down left arm and into neck starting today; pt sts worse with palpation and movement

## 2012-07-23 NOTE — ED Provider Notes (Signed)
I saw and evaluated the patient, reviewed the resident's note and I agree with the findings and plan and agree with their ECG interpretation. Patient with chest pain. EKG was reassuring. Doubt cardiac or pulmonary movements cause. Discharged home  Ruth [name]. Rubin Payor, MD 07/23/12 1453

## 2012-08-02 ENCOUNTER — Emergency Department (HOSPITAL_COMMUNITY)
Admission: EM | Admit: 2012-08-02 | Discharge: 2012-08-02 | Disposition: A | Discharge status: Home / Self Care | Payer: Medicaid Other | Attending: Emergency Medicine | Admitting: Emergency Medicine

## 2012-08-02 ENCOUNTER — Encounter (HOSPITAL_COMMUNITY): Payer: Self-pay | Admitting: *Deleted

## 2012-08-02 DIAGNOSIS — Z9889 Other specified postprocedural states: Secondary | ICD-10-CM | POA: Insufficient documentation

## 2012-08-02 DIAGNOSIS — Z8739 Personal history of other diseases of the musculoskeletal system and connective tissue: Secondary | ICD-10-CM | POA: Insufficient documentation

## 2012-08-02 DIAGNOSIS — F17200 Nicotine dependence, unspecified, uncomplicated: Secondary | ICD-10-CM | POA: Insufficient documentation

## 2012-08-02 DIAGNOSIS — I10 Essential (primary) hypertension: Secondary | ICD-10-CM | POA: Insufficient documentation

## 2012-08-02 DIAGNOSIS — Z8659 Personal history of other mental and behavioral disorders: Secondary | ICD-10-CM | POA: Insufficient documentation

## 2012-08-02 DIAGNOSIS — E119 Type 2 diabetes mellitus without complications: Secondary | ICD-10-CM | POA: Insufficient documentation

## 2012-08-02 DIAGNOSIS — IMO0002 Reserved for concepts with insufficient information to code with codable children: Secondary | ICD-10-CM | POA: Insufficient documentation

## 2012-08-02 DIAGNOSIS — J45909 Unspecified asthma, uncomplicated: Secondary | ICD-10-CM | POA: Insufficient documentation

## 2012-08-02 DIAGNOSIS — L02412 Cutaneous abscess of left axilla: Secondary | ICD-10-CM

## 2012-08-02 MED ORDER — SULFAMETHOXAZOLE-TRIMETHOPRIM 800-160 MG PO TABS: 1.0000 | ORAL_TABLET | Freq: Two times a day (BID) | ORAL | Status: DC

## 2012-08-02 MED ORDER — OXYCODONE-ACETAMINOPHEN 5-325 MG PO TABS
1.0000 | ORAL_TABLET | Freq: Once | ORAL | Status: AC
  Administered 2012-08-02: 1 via ORAL
  Filled 2012-08-02: qty 1

## 2012-08-02 MED ORDER — HYDROCODONE-ACETAMINOPHEN 5-325 MG PO TABS: 2.0000 | ORAL_TABLET | Freq: Four times a day (QID) | ORAL | Status: DC | PRN

## 2012-08-02 NOTE — ED Provider Notes (Signed)
Medical screening examination/treatment/procedure(s) were performed by non-physician practitioner and as supervising physician I was immediately available for consultation/collaboration.  Gilda Crease, MD 08/02/12 2259

## 2012-08-02 NOTE — ED Notes (Signed)
Pt presents with two boils that have ruptured under left axilla. Pt states they ruptured Saturday but the drainage has been green in color and she was concerned about infection. Pt rates pain 6/10. Pt states she has had a boil before and was told to wash it with soap and water and use hot compresses for pain and she states that is what she has been doing to treat at home.

## 2012-08-02 NOTE — ED Notes (Signed)
The pt has a boil under her lt arm that has been visible for 1-2 weeks.  The boil burst this am and she has had more drainage.

## 2012-08-02 NOTE — ED Notes (Signed)
David NP at bedside   

## 2012-08-02 NOTE — ED Provider Notes (Signed)
History   This chart was scribed for non-physician practitioner working with Gilda Crease, by Gerlean Ren, ED Scribe. This patient was seen in room TR08C/TR08C and the patient's care was started at 5:12 PM.    CSN: [id number]  Arrival date & time 08/02/12  1623   First MD Initiated Contact with Patient 08/02/12 1711      Chief Complaint  Patient presents with  . boil     The history is provided by the patient. No language interpreter was used.   Ruth [name] is a 37 y.o. female with h/o DM, HTN, asthma, and bipolar affective disorder who presents to the Emergency Department complaining of a boil gradually worsening over the past 2 weeks in left axilla area that burst this morning and drained green/yellow fluid.  Pt reports associated moderate sharp pain that has been constant, gradually worsening, and does not radiate.  No associated fever.  Pt reports h/o similar boils but that they have always been in left axilla region and never on right side.  Pt is a current everyday smoker but denies alcohol use.  PCP is Dr. Clide Deutscher. Past Medical History  Diagnosis Date  . Diabetes mellitus   . Hypertension   . Asthma   . Bipolar affective disorder   . Fibromyalgia   . Hx of tonsillectomy     History reviewed. No pertinent past surgical history.  No family history on file.  History  Substance Use Topics  . Smoking status: Current Every Day Smoker  . Smokeless tobacco: Not on file  . Alcohol Use: No      Review of Systems  Constitutional: Negative for fever.  Skin:       Abscess  All other systems reviewed and are negative.    Allergies  Penicillins  Home Medications   Current Outpatient Rx  Name  Route  Sig  Dispense  Refill  . IBUPROFEN 200 MG PO TABS   Oral   Take 600 mg by mouth 2 (two) times daily as needed. For pain           BP 114/79  Pulse 88  Temp 97.7 F (36.5 C) (Oral)  Resp 20  SpO2 99%  LMP 06/14/2012  Physical Exam  Nursing note  and vitals reviewed. Constitutional: She is oriented to person, place, and time. She appears well-developed and well-nourished. No distress.  HENT:  Head: Normocephalic and atraumatic.  Eyes: EOM are normal.  Neck: Neck supple.  Cardiovascular: Normal rate, regular rhythm and normal heart sounds.   Pulmonary/Chest: Effort normal and breath sounds normal. No respiratory distress. She has no wheezes.  Abdominal: Soft. There is no tenderness.  Musculoskeletal: Normal range of motion.  Neurological: She is alert and oriented to person, place, and time.  Skin: Skin is warm and dry.       Draining abscess in left axilla 2 inches x 3 inches  Psychiatric: She has a normal mood and affect. Her behavior is normal.    ED Course  Procedures (including critical care time) DIAGNOSTIC STUDIES: Oxygen Saturation is 99% on room air, normal by my interpretation.    COORDINATION OF CARE: 5:15 PM- Informed pt to use warm compresses and light pressure to encourage drainage from abscess.  Advised follow-up with PCP if pain and swelling worsen.  Ordered Septra.  No diagnosis found.  Draining abscess in axilla.  Antibiotics.  Follow-up with PCP if not improving.  MDM  Jimmye Norman, NP 08/02/12 2042

## 2012-08-22 ENCOUNTER — Emergency Department (HOSPITAL_COMMUNITY)
Admission: EM | Admit: 2012-08-22 | Discharge: 2012-08-22 | Disposition: A | Discharge status: Home / Self Care | Payer: Medicaid Other | Attending: Emergency Medicine | Admitting: Emergency Medicine

## 2012-08-22 ENCOUNTER — Encounter (HOSPITAL_COMMUNITY): Payer: Self-pay | Admitting: Emergency Medicine

## 2012-08-22 DIAGNOSIS — J329 Chronic sinusitis, unspecified: Secondary | ICD-10-CM

## 2012-08-22 DIAGNOSIS — E119 Type 2 diabetes mellitus without complications: Secondary | ICD-10-CM | POA: Insufficient documentation

## 2012-08-22 DIAGNOSIS — J45909 Unspecified asthma, uncomplicated: Secondary | ICD-10-CM | POA: Insufficient documentation

## 2012-08-22 DIAGNOSIS — I10 Essential (primary) hypertension: Secondary | ICD-10-CM | POA: Insufficient documentation

## 2012-08-22 DIAGNOSIS — H9209 Otalgia, unspecified ear: Secondary | ICD-10-CM | POA: Insufficient documentation

## 2012-08-22 DIAGNOSIS — R51 Headache: Secondary | ICD-10-CM | POA: Insufficient documentation

## 2012-08-22 DIAGNOSIS — J019 Acute sinusitis, unspecified: Secondary | ICD-10-CM | POA: Insufficient documentation

## 2012-08-22 DIAGNOSIS — F17200 Nicotine dependence, unspecified, uncomplicated: Secondary | ICD-10-CM | POA: Insufficient documentation

## 2012-08-22 DIAGNOSIS — F3130 Bipolar disorder, current episode depressed, mild or moderate severity, unspecified: Secondary | ICD-10-CM | POA: Insufficient documentation

## 2012-08-22 DIAGNOSIS — IMO0001 Reserved for inherently not codable concepts without codable children: Secondary | ICD-10-CM | POA: Insufficient documentation

## 2012-08-22 DIAGNOSIS — J029 Acute pharyngitis, unspecified: Secondary | ICD-10-CM | POA: Insufficient documentation

## 2012-08-22 MED ORDER — AZITHROMYCIN 250 MG PO TABS: 250.0000 mg | ORAL_TABLET | Freq: Every day | ORAL | Status: DC

## 2012-08-22 MED ORDER — FLUTICASONE PROPIONATE 50 MCG/ACT NA SUSP: 2.0000 | Freq: Every day | NASAL | Status: DC

## 2012-08-22 MED ORDER — AZITHROMYCIN 250 MG PO TABS
500.0000 mg | ORAL_TABLET | Freq: Once | ORAL | Status: AC
  Administered 2012-08-22: 500 mg via ORAL
  Filled 2012-08-22: qty 2

## 2012-08-22 NOTE — ED Notes (Signed)
Onset 3-4 days ago nasal congestion with clear drainage and head fullness.  OTC with slight relief.

## 2012-08-22 NOTE — ED Provider Notes (Signed)
History     CSN: [id number]  Arrival date & time 08/22/12  6440   First MD Initiated Contact with Patient 08/22/12 1004      Chief Complaint  Patient presents with  . Nasal Congestion    (Consider location/radiation/quality/duration/timing/severity/associated sxs/prior treatment) Patient is a 37 y.o. female presenting with URI. The history is provided by the patient.  URI Presenting symptoms: congestion, ear pain, facial pain, rhinorrhea and sore throat   Presenting symptoms: no cough and no fever   Severity:  Moderate Duration:  3 days Progression:  Worsening Relieved by:  Nothing Associated symptoms: headaches   Associated symptoms comment:  Facial pain, sinus pressure, sore throat and nasal congestion for the past 3 days. No known fever, but she reports chills. No ill contacts.   Past Medical History  Diagnosis Date  . Diabetes mellitus   . Hypertension   . Asthma   . Bipolar affective disorder   . Fibromyalgia   . Hx of tonsillectomy     History reviewed. No pertinent past surgical history.  No family history on file.  History  Substance Use Topics  . Smoking status: Current Every Day Smoker  . Smokeless tobacco: Not on file  . Alcohol Use: No    OB History   Grav Para Term Preterm Abortions TAB SAB Ect Mult Living                  Review of Systems  Constitutional: Negative for fever.  HENT: Positive for ear pain, congestion, sore throat and rhinorrhea. Negative for trouble swallowing.   Eyes: Negative for discharge.  Respiratory: Negative for cough and shortness of breath.   Cardiovascular: Negative for chest pain.  Gastrointestinal: Negative for nausea and abdominal pain.  Neurological: Positive for headaches.    Allergies  Penicillins  Home Medications   Current Outpatient Rx  Name  Route  Sig  Dispense  Refill  . HYDROcodone-acetaminophen (NORCO/VICODIN) 5-325 MG per tablet   Oral   Take 2 tablets by mouth every 6 (six) hours as needed  for pain.   15 tablet   0   . ibuprofen (ADVIL,MOTRIN) 200 MG tablet   Oral   Take 600 mg by mouth 2 (two) times daily as needed. For pain           BP 133/84  Pulse 88  Temp(Src) 98 F (36.7 C) (Oral)  Resp 18  SpO2 98%  Physical Exam  Constitutional: She appears well-developed and well-nourished.  HENT:  Head: Normocephalic.  Right Ear: External ear normal.  Left Ear: External ear normal.  Nose: Mucosal edema present. Right sinus exhibits frontal sinus tenderness. Left sinus exhibits frontal sinus tenderness.  Mouth/Throat: Oropharynx is clear and moist.  Neck: Normal range of motion. Neck supple.  Cardiovascular: Normal rate and normal heart sounds.   No murmur heard. Pulmonary/Chest: Effort normal and breath sounds normal. She has no wheezes. She has no rales.  Abdominal: Soft. Bowel sounds are normal. She exhibits no distension. There is no tenderness.  Musculoskeletal: Normal range of motion.  Lymphadenopathy:    She has no cervical adenopathy.  Skin: Skin is warm and dry. No pallor.    ED Course  Procedures (including critical care time)  Labs Reviewed - No data to display No results found.   No diagnosis found. 1. Sinusitis    MDM  Uncomplicated sinusitis with marked sinus tenderness. Will opt to treat with abx and symptomatic treatment  Arnoldo Hooker, PA-C 08/22/12 1103

## 2012-08-22 NOTE — ED Provider Notes (Signed)
Medical screening examination/treatment/procedure(s) were performed by non-physician practitioner and as supervising physician I was immediately available for consultation/collaboration.   David H Yao, MD 08/22/12 1558 

## 2013-02-16 ENCOUNTER — Emergency Department (HOSPITAL_COMMUNITY)
Admission: EM | Admit: 2013-02-16 | Discharge: 2013-02-16 | Disposition: A | Discharge status: Home / Self Care | Payer: Medicaid Other | Attending: Emergency Medicine | Admitting: Emergency Medicine

## 2013-02-16 ENCOUNTER — Encounter (HOSPITAL_COMMUNITY): Payer: Self-pay | Admitting: Emergency Medicine

## 2013-02-16 DIAGNOSIS — L02411 Cutaneous abscess of right axilla: Secondary | ICD-10-CM

## 2013-02-16 DIAGNOSIS — IMO0002 Reserved for concepts with insufficient information to code with codable children: Secondary | ICD-10-CM | POA: Insufficient documentation

## 2013-02-16 DIAGNOSIS — F17200 Nicotine dependence, unspecified, uncomplicated: Secondary | ICD-10-CM | POA: Insufficient documentation

## 2013-02-16 DIAGNOSIS — Z9889 Other specified postprocedural states: Secondary | ICD-10-CM | POA: Insufficient documentation

## 2013-02-16 DIAGNOSIS — Z792 Long term (current) use of antibiotics: Secondary | ICD-10-CM | POA: Insufficient documentation

## 2013-02-16 DIAGNOSIS — Z880 Allergy status to penicillin: Secondary | ICD-10-CM | POA: Insufficient documentation

## 2013-02-16 DIAGNOSIS — Z8659 Personal history of other mental and behavioral disorders: Secondary | ICD-10-CM | POA: Insufficient documentation

## 2013-02-16 DIAGNOSIS — IMO0001 Reserved for inherently not codable concepts without codable children: Secondary | ICD-10-CM | POA: Insufficient documentation

## 2013-02-16 DIAGNOSIS — R21 Rash and other nonspecific skin eruption: Secondary | ICD-10-CM | POA: Insufficient documentation

## 2013-02-16 DIAGNOSIS — I10 Essential (primary) hypertension: Secondary | ICD-10-CM | POA: Insufficient documentation

## 2013-02-16 DIAGNOSIS — J45909 Unspecified asthma, uncomplicated: Secondary | ICD-10-CM | POA: Insufficient documentation

## 2013-02-16 DIAGNOSIS — E119 Type 2 diabetes mellitus without complications: Secondary | ICD-10-CM | POA: Insufficient documentation

## 2013-02-16 MED ORDER — OXYCODONE-ACETAMINOPHEN 5-325 MG PO TABS: 2.0000 | ORAL_TABLET | Freq: Once | ORAL | Status: DC

## 2013-02-16 MED ORDER — CEPHALEXIN 500 MG PO CAPS: 500.0000 mg | ORAL_CAPSULE | Freq: Four times a day (QID) | ORAL | Status: DC

## 2013-02-16 MED ORDER — HYDROCODONE-ACETAMINOPHEN 5-325 MG PO TABS: 1.0000 | ORAL_TABLET | ORAL | Status: DC | PRN

## 2013-02-16 MED ORDER — SULFAMETHOXAZOLE-TRIMETHOPRIM 800-160 MG PO TABS: 1.0000 | ORAL_TABLET | Freq: Two times a day (BID) | ORAL | Status: AC

## 2013-02-16 NOTE — ED Provider Notes (Addendum)
Patient with hx of DM seen by Dr. Derwood Kaplan for c/o R axillary pain x 4 days. Pain associated with focal, nonradiating pain that is worse with palpation. Denies fever, numbness/tingling, and red linear streaking from the affected area. No hx MRSA. Plan to d/c with Bactrim and Keflex; patient reliable for PCP follow up and agreeable to plan.  INCISION AND DRAINAGE Performed by: Antony Madura Consent: Verbal consent obtained. Risks and benefits: risks, benefits and alternatives were discussed Type: abscess  Body area: R axilla  Anesthesia: local infiltration  Incision was made with a scalpel.  Local anesthetic: lidocaine 2% with epinephrine  Anesthetic total: 3 ml  Complexity: complex Blunt dissection to break up loculations  Drainage: purulent  Drainage amount: copious  Packing material: none  Patient tolerance: Patient tolerated the procedure well with no immediate complications.     Antony Madura, PA-C 02/16/13 1411  Antony Madura, PA-C 02/22/13 1432

## 2013-02-16 NOTE — ED Notes (Signed)
Pt c/o abscess under right arm x4 days.

## 2013-02-18 NOTE — ED Provider Notes (Addendum)
Medical screening examination/treatment/procedure(s) were conducted as a shared visit with non-physician practitioner(s) and myself.  I personally evaluated the patient during the encounter.   Derwood Kaplan, MD 02/18/13 [phone]  Pt with abscess, as in my HPI. Antony Madura, PAC completed the i&d  Derwood Kaplan, MD 02/26/13 2100

## 2013-02-26 NOTE — ED Provider Notes (Signed)
Medical screening examination/treatment/procedure(s) were conducted as a shared visit with non-physician practitioner(s) and myself.  I personally evaluated the patient during the encounter.  Abscess, diabetic, in the axilla. Drained.  Derwood Kaplan, MD 02/26/13 2105

## 2013-02-26 NOTE — ED Provider Notes (Signed)
  CSN: [id number]     Arrival date & time 02/16/13  1301 History     First MD Initiated Contact with Patient 02/16/13 1315     Chief Complaint  Patient presents with  . Abscess   (Consider location/radiation/quality/duration/timing/severity/associated sxs/prior Treatment) HPI Comments: Pt comes in with cc of abscess. Pt has hx of dm, hx of abscess. Has abscess in the right arm pit, started with pain 5 days ago, worse over the past 2 days, with some purulent drainage. No shaving injury. No n/v/f/c.  Patient is a 37 y.o. female presenting with abscess. The history is provided by the patient.  Abscess Associated symptoms: no fever, no nausea and no vomiting     Past Medical History  Diagnosis Date  . Diabetes mellitus   . Hypertension   . Asthma   . Bipolar affective disorder   . Fibromyalgia   . Hx of tonsillectomy    History reviewed. No pertinent past surgical history. History reviewed. No pertinent family history. History  Substance Use Topics  . Smoking status: Current Every Day Smoker  . Smokeless tobacco: Not on file  . Alcohol Use: No   OB History   Grav Para Term Preterm Abortions TAB SAB Ect Mult Living                 Review of Systems  Constitutional: Negative for fever, chills and activity change.  Gastrointestinal: Negative for nausea and vomiting.  Skin: Positive for rash.  Hematological: Does not bruise/bleed easily.    Allergies  Penicillins  Home Medications   Current Outpatient Rx  Name  Route  Sig  Dispense  Refill  . ibuprofen (ADVIL,MOTRIN) 200 MG tablet   Oral   Take 600 mg by mouth 2 (two) times daily as needed. For pain         . cephALEXin (KEFLEX) 500 MG capsule   Oral   Take 1 capsule (500 mg total) by mouth 4 (four) times daily.   28 capsule   0   . HYDROcodone-acetaminophen (NORCO/VICODIN) 5-325 MG per tablet   Oral   Take 1 tablet by mouth every 4 (four) hours as needed for pain.   10 tablet   0    BP 131/88  Pulse  87  Temp(Src) 97.4 F (36.3 C) (Oral)  Resp 14  SpO2 98%  LMP 02/09/2013 Physical Exam  Nursing note and vitals reviewed. Constitutional: She is oriented to person, place, and time. She appears well-developed.  HENT:  Head: Normocephalic.  Eyes: Pupils are equal, round, and reactive to light.  Neck: Normal range of motion.  Cardiovascular: Normal rate and regular rhythm.   Pulmonary/Chest: Effort normal.  Abdominal: Soft. She exhibits no distension.  Neurological: She is alert and oriented to person, place, and time.  Skin:  4 cm abscess, right axilla    ED Course   Procedures (including critical care time)  Labs Reviewed - No data to display No results found. 1. Abscess of axilla, right     MDM  Pt with abscess to the right axilla. Will drain. Diabetic - no systemic signs.  Derwood Kaplan, MD 02/26/13 2105

## 2013-03-04 ENCOUNTER — Emergency Department (HOSPITAL_COMMUNITY)
Admission: EM | Admit: 2013-03-04 | Discharge: 2013-03-04 | Disposition: A | Discharge status: Home / Self Care | Payer: Medicaid Other | Attending: Emergency Medicine | Admitting: Emergency Medicine

## 2013-03-04 ENCOUNTER — Encounter (HOSPITAL_COMMUNITY): Payer: Self-pay | Admitting: *Deleted

## 2013-03-04 DIAGNOSIS — F17200 Nicotine dependence, unspecified, uncomplicated: Secondary | ICD-10-CM | POA: Insufficient documentation

## 2013-03-04 DIAGNOSIS — Y939 Activity, unspecified: Secondary | ICD-10-CM | POA: Insufficient documentation

## 2013-03-04 DIAGNOSIS — I10 Essential (primary) hypertension: Secondary | ICD-10-CM | POA: Insufficient documentation

## 2013-03-04 DIAGNOSIS — L02219 Cutaneous abscess of trunk, unspecified: Secondary | ICD-10-CM | POA: Insufficient documentation

## 2013-03-04 DIAGNOSIS — Z880 Allergy status to penicillin: Secondary | ICD-10-CM | POA: Insufficient documentation

## 2013-03-04 DIAGNOSIS — F319 Bipolar disorder, unspecified: Secondary | ICD-10-CM | POA: Insufficient documentation

## 2013-03-04 DIAGNOSIS — Y929 Unspecified place or not applicable: Secondary | ICD-10-CM | POA: Insufficient documentation

## 2013-03-04 DIAGNOSIS — IMO0001 Reserved for inherently not codable concepts without codable children: Secondary | ICD-10-CM | POA: Insufficient documentation

## 2013-03-04 DIAGNOSIS — T63461A Toxic effect of venom of wasps, accidental (unintentional), initial encounter: Secondary | ICD-10-CM | POA: Insufficient documentation

## 2013-03-04 DIAGNOSIS — Z9889 Other specified postprocedural states: Secondary | ICD-10-CM | POA: Insufficient documentation

## 2013-03-04 DIAGNOSIS — J45909 Unspecified asthma, uncomplicated: Secondary | ICD-10-CM | POA: Insufficient documentation

## 2013-03-04 DIAGNOSIS — E1159 Type 2 diabetes mellitus with other circulatory complications: Secondary | ICD-10-CM | POA: Insufficient documentation

## 2013-03-04 DIAGNOSIS — L03311 Cellulitis of abdominal wall: Secondary | ICD-10-CM

## 2013-03-04 MED ORDER — CLINDAMYCIN HCL 150 MG PO CAPS: 150.0000 mg | ORAL_CAPSULE | Freq: Four times a day (QID) | ORAL | Status: DC

## 2013-03-04 NOTE — ED Notes (Signed)
Pt in stating she was stung by a bee last week on the abdomen, states area is still tender and red, wanted to get this checked out, redness noted

## 2013-03-04 NOTE — ED Provider Notes (Signed)
CSN: [id number]     Arrival date & time 03/04/13  2254 History  This chart was scribed for non-physician practitioner working with Flint Melter, MD by Valera Castle, ED scribe. This patient was seen in room TR06C/TR06C and the patient's care was started at 11:02 PM.    Chief Complaint  Patient presents with  . Insect Bite    The history is provided by the patient. No language interpreter was used.   HPI Comments: Ruth [name] is a 37 y.o. female who presents to the Emergency Department complaining of tenderness and erythema to her abdomen due to a bee sting, onset one week ago. Pt states it started a the top of her abdomen and grew to the lower part of her abdomen. Pt has tried hydrocortisone cream and benadryl with no relief. Pt denies fever, headache, stiff neck, tick bite, joint pain, n/v/d. Pt states she has diabetes. Pt states allergy to Pencillin.    Past Medical History  Diagnosis Date  . Diabetes mellitus   . Hypertension   . Asthma   . Bipolar affective disorder   . Fibromyalgia   . Hx of tonsillectomy    History reviewed. No pertinent past surgical history. History reviewed. No pertinent family history. History  Substance Use Topics  . Smoking status: Current Every Day Smoker  . Smokeless tobacco: Not on file  . Alcohol Use: No   OB History   Grav Para Term Preterm Abortions TAB SAB Ect Mult Living                 Review of Systems  Constitutional: Negative for fever.  Neurological: Negative for numbness.  All other systems reviewed and are negative.    Allergies  Penicillins  Home Medications   Current Outpatient Rx  Name  Route  Sig  Dispense  Refill  . cephALEXin (KEFLEX) 500 MG capsule   Oral   Take 1 capsule (500 mg total) by mouth 4 (four) times daily.   28 capsule   0   . HYDROcodone-acetaminophen (NORCO/VICODIN) 5-325 MG per tablet   Oral   Take 1 tablet by mouth every 4 (four) hours as needed for pain.   10 tablet   0   .  ibuprofen (ADVIL,MOTRIN) 200 MG tablet   Oral   Take 600 mg by mouth 2 (two) times daily as needed. For pain          BP 148/89  Pulse 88  Temp(Src) 98.8 F (37.1 C) (Oral)  Resp 20  Wt 291 lb (131.997 kg)  BMI 45.57 kg/m2  SpO2 100%  LMP 02/09/2013  Physical Exam  Nursing note and vitals reviewed. Constitutional: She is oriented to person, place, and time. She appears well-developed and well-nourished.  HENT:  Head: Normocephalic and atraumatic.  Eyes: EOM are normal.  Neck: Normal range of motion. Neck supple.  Cardiovascular: Normal rate.   Pulmonary/Chest: Effort normal.  Abdominal: There is no tenderness.  Area of mild induration and erythema to mid abdomen noted to anterior aspects, measuring 1"x4". Mild tenderness to palpation. No fluctuance. No pus. No petechia.    Musculoskeletal: Normal range of motion.  Neurological: She is alert and oriented to person, place, and time.  Skin: Skin is warm and dry.  Psychiatric: She has a normal mood and affect. Her behavior is normal.    ED Course  Procedures (including critical care time)  DIAGNOSTIC STUDIES: Oxygen Saturation is 100% on room air, normal by my interpretation.  COORDINATION OF CARE: 11:18 PM-Discussed treatment plan which includes antibiotics with pt at bedside and pt agreed to plan.      Labs Review Labs Reviewed - No data to display Imaging Review No results found.  MDM   1. Abdominal wall cellulitis    BP 148/89  Pulse 88  Temp(Src) 98.8 F (37.1 C) (Oral)  Resp 20  Wt 291 lb (131.997 kg)  BMI 45.57 kg/m2  SpO2 100%  LMP 02/09/2013   Doubts tick bite, [place] fever, lyme disease, syphilis.   I personally performed the services described in this documentation, which was scribed in my presence. The recorded information has been reviewed and is accurate.    Fayrene Helper, PA-C 03/04/13 2329  Fayrene Helper, PA-C 03/04/13 [phone]

## 2013-03-04 NOTE — ED Notes (Signed)
Patient got stung by bee last week, has red area in LLQ

## 2013-03-05 NOTE — ED Provider Notes (Signed)
Medical screening examination/treatment/procedure(s) were performed by non-physician practitioner and as supervising physician I was immediately available for consultation/collaboration.  Flint Melter, MD 03/05/13 [phone]

## 2013-04-11 ENCOUNTER — Emergency Department (HOSPITAL_COMMUNITY)
Admission: EM | Admit: 2013-04-11 | Discharge: 2013-04-11 | Disposition: A | Discharge status: Home / Self Care | Payer: Medicaid Other | Attending: Emergency Medicine | Admitting: Emergency Medicine

## 2013-04-11 ENCOUNTER — Encounter (HOSPITAL_COMMUNITY): Payer: Self-pay | Admitting: Nurse Practitioner

## 2013-04-11 DIAGNOSIS — L089 Local infection of the skin and subcutaneous tissue, unspecified: Secondary | ICD-10-CM

## 2013-04-11 DIAGNOSIS — L02219 Cutaneous abscess of trunk, unspecified: Secondary | ICD-10-CM | POA: Insufficient documentation

## 2013-04-11 DIAGNOSIS — F17200 Nicotine dependence, unspecified, uncomplicated: Secondary | ICD-10-CM | POA: Insufficient documentation

## 2013-04-11 DIAGNOSIS — Z8659 Personal history of other mental and behavioral disorders: Secondary | ICD-10-CM | POA: Insufficient documentation

## 2013-04-11 DIAGNOSIS — Z880 Allergy status to penicillin: Secondary | ICD-10-CM | POA: Insufficient documentation

## 2013-04-11 DIAGNOSIS — Z23 Encounter for immunization: Secondary | ICD-10-CM | POA: Insufficient documentation

## 2013-04-11 DIAGNOSIS — Z792 Long term (current) use of antibiotics: Secondary | ICD-10-CM | POA: Insufficient documentation

## 2013-04-11 DIAGNOSIS — J45909 Unspecified asthma, uncomplicated: Secondary | ICD-10-CM | POA: Insufficient documentation

## 2013-04-11 DIAGNOSIS — Z791 Long term (current) use of non-steroidal anti-inflammatories (NSAID): Secondary | ICD-10-CM | POA: Insufficient documentation

## 2013-04-11 DIAGNOSIS — E119 Type 2 diabetes mellitus without complications: Secondary | ICD-10-CM | POA: Insufficient documentation

## 2013-04-11 DIAGNOSIS — I10 Essential (primary) hypertension: Secondary | ICD-10-CM | POA: Insufficient documentation

## 2013-04-11 DIAGNOSIS — IMO0002 Reserved for concepts with insufficient information to code with codable children: Secondary | ICD-10-CM | POA: Insufficient documentation

## 2013-04-11 DIAGNOSIS — M719 Bursopathy, unspecified: Secondary | ICD-10-CM

## 2013-04-11 DIAGNOSIS — M7150 Other bursitis, not elsewhere classified, unspecified site: Secondary | ICD-10-CM | POA: Insufficient documentation

## 2013-04-11 MED ORDER — HYDROCODONE-ACETAMINOPHEN 5-325 MG PO TABS: 2.0000 | ORAL_TABLET | ORAL | Status: DC | PRN

## 2013-04-11 MED ORDER — SULFAMETHOXAZOLE-TRIMETHOPRIM 800-160 MG PO TABS: ORAL_TABLET | ORAL | Status: DC

## 2013-04-11 MED ORDER — TETANUS-DIPHTH-ACELL PERTUSSIS 5-2.5-18.5 LF-MCG/0.5 IM SUSP
0.5000 mL | Freq: Once | INTRAMUSCULAR | Status: AC
  Administered 2013-04-11: 0.5 mL via INTRAMUSCULAR
  Filled 2013-04-11: qty 0.5

## 2013-04-11 NOTE — ED Notes (Signed)
Pt c/o abcess under R breast that is painful with foul smelling drainage, she has been trying to drain it at home but continues to get worse. Also she has been having R outer leg pain from thigh to ankle for a week, OTC meds with no relief, denies any injuries.

## 2013-04-11 NOTE — ED Notes (Signed)
Spoke with provider to request tdap as pt did not know when was the last time she received one.

## 2013-04-11 NOTE — ED Provider Notes (Signed)
CSN: [id number]     Arrival date & time 04/11/13  1046 History  This chart was scribed for non-physician practitioner Teressa Lower, NP, working with Celene Kras, MD by Ronal Fear, ED scribe. This patient was seen in room TR10C/TR10C and the patient's care was started at 11:45 AM.    Chief Complaint  Patient presents with  . Leg Pain  . Wound Infection   Patient is a 37 y.o. female presenting with leg pain and abscess.  Leg Pain Location:  Leg Leg location:  R leg and R upper leg Pain details:    Quality:  Sharp and shooting   Radiates to:  R leg   Severity:  Mild   Onset quality:  Sudden   Duration:  1 week   Timing:  Constant   Progression:  Worsening Associated symptoms: swelling and tingling   Associated symptoms: no fever   Abscess Location:  Torso Abscess quality: draining and redness   Progression:  Worsening Context: diabetes   Relieved by:  Nothing Worsened by:  Nothing tried Ineffective treatments:  Draining/squeezing, aspirin and NSAIDs Associated symptoms: no fever, no nausea and no vomiting    Pt has been having leg pain that has gradually worsened over 1x week that radiates throughout her entire right leg. She has a hx of diabetes. Pt's LNMP was normal   Past Medical History  Diagnosis Date  . Diabetes mellitus   . Hypertension   . Asthma   . Bipolar affective disorder   . Fibromyalgia   . Hx of tonsillectomy   . Thyroid disease   . Bronchitis    Past Surgical History  Procedure Laterality Date  . Cesarean section    . Vagina surgery      fistula   History reviewed. No pertinent family history. History  Substance Use Topics  . Smoking status: Current Every Day Smoker    Types: Cigarettes  . Smokeless tobacco: Not on file  . Alcohol Use: No   OB History   Grav Para Term Preterm Abortions TAB SAB Ect Mult Living                 Review of Systems  Constitutional: Negative for fever.  Gastrointestinal: Negative for nausea and vomiting.   All other systems reviewed and are negative.    Allergies  Penicillins  Home Medications   Current Outpatient Rx  Name  Route  Sig  Dispense  Refill  . cephALEXin (KEFLEX) 500 MG capsule   Oral   Take 1 capsule (500 mg total) by mouth 4 (four) times daily.   28 capsule   0   . clindamycin (CLEOCIN) 150 MG capsule   Oral   Take 1 capsule (150 mg total) by mouth every 6 (six) hours.   28 capsule   0   . diphenhydrAMINE (BENADRYL) 25 mg capsule   Oral   Take 25 mg by mouth every 6 (six) hours as needed for itching.         Marland Kitchen HYDROcodone-acetaminophen (NORCO/VICODIN) 5-325 MG per tablet   Oral   Take 1 tablet by mouth every 4 (four) hours as needed for pain.   10 tablet   0   . hydrocortisone cream 1 %   Topical   Apply 1 application topically 2 (two) times daily.         Marland Kitchen ibuprofen (ADVIL,MOTRIN) 200 MG tablet   Oral   Take 600 mg by mouth 2 (two) times daily as needed. For  pain         . naproxen sodium (ANAPROX) 220 MG tablet   Oral   Take 220 mg by mouth 2 (two) times daily with a meal.          BP 105/61  Pulse 83  Temp(Src) 98.1 F (36.7 C) (Oral)  Resp 15  Ht 5\' 6"  (1.676 m)  Wt 297 lb (134.718 kg)  BMI 47.96 kg/m2  SpO2 100% Physical Exam  Nursing note and vitals reviewed. Constitutional: She is oriented to person, place, and time. She appears well-developed and well-nourished. No distress.  HENT:  Head: Normocephalic and atraumatic.  Eyes: EOM are normal.  Neck: Neck supple. No tracheal deviation present.  Cardiovascular: Normal rate.   Pulmonary/Chest: Effort normal. No respiratory distress.  Musculoskeletal: Normal range of motion. She exhibits tenderness.  Right lateral hip tender to palpation  Neurological: She is alert and oriented to person, place, and time.  Skin: Skin is warm and dry. There is erythema.  Small red drained area to the right breast without fluctuance of firmness.   Psychiatric: She has a normal mood and  affect. Her behavior is normal.    ED Course  Procedures (including critical care time)  DIAGNOSTIC STUDIES: Oxygen Saturation is 100% on RA, normal by my interpretation.    COORDINATION OF CARE: 11:55 AM- Pt advised of plan for treatment including pain medication for he rleg and an antibiotic for the infection and pt agrees.      Labs Review Labs Reviewed - No data to display Imaging Review No results found.  MDM   1. Skin infection   2. Bursitis    Likely early abscess forming:will treat with infection:pt is neurologically intact  I personally performed the services described in this documentation, which was scribed in my presence. The recorded information has been reviewed and is accurate.    Teressa Lower, NP 04/11/13 1206

## 2013-04-11 NOTE — ED Provider Notes (Signed)
Medical screening examination/treatment/procedure(s) were performed by non-physician practitioner and as supervising physician I was immediately available for consultation/collaboration.   Celene Kras, MD 04/11/13 [phone]

## 2013-06-15 ENCOUNTER — Encounter (HOSPITAL_COMMUNITY): Payer: Self-pay | Admitting: Emergency Medicine

## 2013-06-15 ENCOUNTER — Emergency Department (HOSPITAL_COMMUNITY)
Admission: EM | Admit: 2013-06-15 | Discharge: 2013-06-15 | Discharge status: Against Medical Advice | Payer: Medicaid Other | Attending: Emergency Medicine | Admitting: Emergency Medicine

## 2013-06-15 DIAGNOSIS — R109 Unspecified abdominal pain: Secondary | ICD-10-CM | POA: Insufficient documentation

## 2013-06-15 DIAGNOSIS — F17200 Nicotine dependence, unspecified, uncomplicated: Secondary | ICD-10-CM | POA: Insufficient documentation

## 2013-06-15 DIAGNOSIS — I10 Essential (primary) hypertension: Secondary | ICD-10-CM | POA: Insufficient documentation

## 2013-06-15 DIAGNOSIS — E669 Obesity, unspecified: Secondary | ICD-10-CM | POA: Insufficient documentation

## 2013-06-15 DIAGNOSIS — J45909 Unspecified asthma, uncomplicated: Secondary | ICD-10-CM | POA: Insufficient documentation

## 2013-06-15 DIAGNOSIS — E119 Type 2 diabetes mellitus without complications: Secondary | ICD-10-CM | POA: Insufficient documentation

## 2013-06-15 HISTORY — DX: Obesity, unspecified: E66.9

## 2013-06-15 LAB — COMPREHENSIVE METABOLIC PANEL
AST: 11 U/L (ref 0–37)
Albumin: 4.2 g/dL (ref 3.5–5.2)
Alkaline Phosphatase: 88 U/L (ref 39–117)
BUN: 15 mg/dL (ref 6–23)
Chloride: 98 mEq/L (ref 96–112)
Creatinine, Ser: 0.85 mg/dL (ref 0.50–1.10)
Potassium: 3.8 mEq/L (ref 3.5–5.1)
Total Bilirubin: 0.1 mg/dL — ABNORMAL LOW (ref 0.3–1.2)
Total Protein: 8.7 g/dL — ABNORMAL HIGH (ref 6.0–8.3)

## 2013-06-15 LAB — CBC WITH DIFFERENTIAL/PLATELET
Basophils Absolute: 0 10*3/uL (ref 0.0–0.1)
Basophils Relative: 0 % (ref 0–1)
Eosinophils Absolute: 0.3 10*3/uL (ref 0.0–0.7)
MCH: 25.2 pg — ABNORMAL LOW (ref 26.0–34.0)
MCHC: 32.7 g/dL (ref 30.0–36.0)
Neutro Abs: 4.6 10*3/uL (ref 1.7–7.7)
Neutrophils Relative %: 61 % (ref 43–77)
RDW: 16 % — ABNORMAL HIGH (ref 11.5–15.5)

## 2013-06-15 LAB — LIPASE, BLOOD: Lipase: 22 U/L (ref 11–59)

## 2013-06-15 NOTE — ED Notes (Signed)
LWBS 

## 2013-06-15 NOTE — ED Notes (Signed)
Pt. reports mid/upper abdominal pain with nausea onset today , denies emesis or diarrhea , no fever or chills.

## 2013-07-26 ENCOUNTER — Encounter (HOSPITAL_COMMUNITY): Payer: Self-pay | Admitting: Emergency Medicine

## 2013-07-26 DIAGNOSIS — E119 Type 2 diabetes mellitus without complications: Secondary | ICD-10-CM | POA: Insufficient documentation

## 2013-07-26 DIAGNOSIS — R51 Headache: Secondary | ICD-10-CM | POA: Insufficient documentation

## 2013-07-26 DIAGNOSIS — F17200 Nicotine dependence, unspecified, uncomplicated: Secondary | ICD-10-CM | POA: Insufficient documentation

## 2013-07-26 DIAGNOSIS — I10 Essential (primary) hypertension: Secondary | ICD-10-CM | POA: Insufficient documentation

## 2013-07-26 DIAGNOSIS — J45909 Unspecified asthma, uncomplicated: Secondary | ICD-10-CM | POA: Insufficient documentation

## 2013-07-26 DIAGNOSIS — E669 Obesity, unspecified: Secondary | ICD-10-CM | POA: Insufficient documentation

## 2013-07-26 NOTE — ED Notes (Signed)
The pt is c/o a headache from the back of her neck for 2 days with no nor v.  She describes it as a pressure sensation

## 2013-07-27 ENCOUNTER — Emergency Department (HOSPITAL_COMMUNITY)
Admission: EM | Admit: 2013-07-27 | Discharge: 2013-07-27 | Discharge status: Against Medical Advice | Payer: Medicaid Other | Attending: Emergency Medicine | Admitting: Emergency Medicine

## 2013-09-29 ENCOUNTER — Encounter (HOSPITAL_COMMUNITY): Payer: Self-pay | Admitting: Emergency Medicine

## 2013-09-29 ENCOUNTER — Emergency Department (HOSPITAL_COMMUNITY)
Admission: EM | Admit: 2013-09-29 | Discharge: 2013-09-30 | Disposition: A | Discharge status: Home / Self Care | Payer: Medicaid Other | Attending: Emergency Medicine | Admitting: Emergency Medicine

## 2013-09-29 DIAGNOSIS — R198 Other specified symptoms and signs involving the digestive system and abdomen: Secondary | ICD-10-CM | POA: Insufficient documentation

## 2013-09-29 DIAGNOSIS — F17200 Nicotine dependence, unspecified, uncomplicated: Secondary | ICD-10-CM | POA: Insufficient documentation

## 2013-09-29 DIAGNOSIS — Z3202 Encounter for pregnancy test, result negative: Secondary | ICD-10-CM | POA: Insufficient documentation

## 2013-09-29 DIAGNOSIS — IMO0001 Reserved for inherently not codable concepts without codable children: Secondary | ICD-10-CM | POA: Insufficient documentation

## 2013-09-29 DIAGNOSIS — Z880 Allergy status to penicillin: Secondary | ICD-10-CM | POA: Insufficient documentation

## 2013-09-29 DIAGNOSIS — I10 Essential (primary) hypertension: Secondary | ICD-10-CM | POA: Insufficient documentation

## 2013-09-29 DIAGNOSIS — R300 Dysuria: Secondary | ICD-10-CM | POA: Insufficient documentation

## 2013-09-29 DIAGNOSIS — E079 Disorder of thyroid, unspecified: Secondary | ICD-10-CM | POA: Insufficient documentation

## 2013-09-29 DIAGNOSIS — E119 Type 2 diabetes mellitus without complications: Secondary | ICD-10-CM | POA: Insufficient documentation

## 2013-09-29 DIAGNOSIS — Z87440 Personal history of urinary (tract) infections: Secondary | ICD-10-CM | POA: Insufficient documentation

## 2013-09-29 DIAGNOSIS — F319 Bipolar disorder, unspecified: Secondary | ICD-10-CM | POA: Insufficient documentation

## 2013-09-29 DIAGNOSIS — Z79899 Other long term (current) drug therapy: Secondary | ICD-10-CM | POA: Insufficient documentation

## 2013-09-29 DIAGNOSIS — R350 Frequency of micturition: Secondary | ICD-10-CM | POA: Insufficient documentation

## 2013-09-29 DIAGNOSIS — E669 Obesity, unspecified: Secondary | ICD-10-CM | POA: Insufficient documentation

## 2013-09-29 NOTE — ED Notes (Signed)
uti symptoms.  Urgency to go but no urine output. States, "hx. Of uti's."

## 2013-09-30 LAB — URINALYSIS, ROUTINE W REFLEX MICROSCOPIC
Glucose, UA: NEGATIVE mg/dL
Ketones, ur: 15 mg/dL — AB
Leukocytes, UA: NEGATIVE
Nitrite: NEGATIVE
PROTEIN: NEGATIVE mg/dL
Specific Gravity, Urine: 1.036 — ABNORMAL HIGH (ref 1.005–1.030)
UROBILINOGEN UA: 1 mg/dL (ref 0.0–1.0)
pH: 5.5 (ref 5.0–8.0)

## 2013-09-30 LAB — URINE MICROSCOPIC-ADD ON

## 2013-09-30 LAB — POC URINE PREG, ED: PREG TEST UR: NEGATIVE

## 2013-09-30 MED ORDER — PHENAZOPYRIDINE HCL 200 MG PO TABS: 200.0000 mg | ORAL_TABLET | Freq: Three times a day (TID) | ORAL | Status: DC

## 2013-09-30 MED ORDER — ACETAMINOPHEN 325 MG PO TABS
650.0000 mg | ORAL_TABLET | Freq: Once | ORAL | Status: AC
  Administered 2013-09-30: 650 mg via ORAL

## 2013-09-30 NOTE — ED Provider Notes (Signed)
CSN: [id number]     Arrival date & time 09/29/13  2343 History   First MD Initiated Contact with Patient 09/30/13 0106     Chief Complaint  Patient presents with  . Urinary Tract Infection     (Consider location/radiation/quality/duration/timing/severity/associated sxs/prior Treatment) Patient is a 38 y.o. female presenting with urinary tract infection. The history is provided by the patient.  Urinary Tract Infection  She had onset this evening of dysuria along with urinary urgency, frequency, and tenesmus. She denies abdominal pain or flank pain he denies fever or chills. There's been no nausea or vomiting. She has history of urinary tract infections and states that this feels similar to prior UTIs. She has not treated herself with anything. Nothing makes symptoms better nothing makes them worse.  Past Medical History  Diagnosis Date  . Diabetes mellitus   . Hypertension   . Asthma   . Bipolar affective disorder   . Fibromyalgia   . Hx of tonsillectomy   . Thyroid disease   . Bronchitis   . Obesity    Past Surgical History  Procedure Laterality Date  . Cesarean section    . Vagina surgery      fistula   History reviewed. No pertinent family history. History  Substance Use Topics  . Smoking status: Current Every Day Smoker    Types: Cigarettes  . Smokeless tobacco: Not on file  . Alcohol Use: No   OB History   Grav Para Term Preterm Abortions TAB SAB Ect Mult Living                 Review of Systems  All other systems reviewed and are negative.      Allergies  Penicillins  Home Medications   Current Outpatient Rx  Name  Route  Sig  Dispense  Refill  . albuterol (PROVENTIL HFA;VENTOLIN HFA) 108 (90 BASE) MCG/ACT inhaler   Inhalation   Inhale 2 puffs into the lungs every 6 (six) hours as needed for wheezing or shortness of breath.         . gabapentin (NEURONTIN) 300 MG capsule   Oral   Take 600 mg by mouth daily as needed (pain).         .  hydrochlorothiazide (HYDRODIURIL) 25 MG tablet   Oral   Take 25 mg by mouth daily.         Marland Kitchen HYDROcodone-acetaminophen (NORCO/VICODIN) 5-325 MG per tablet   Oral   Take 1 tablet by mouth every 6 (six) hours as needed for moderate pain.         Marland Kitchen levothyroxine (SYNTHROID, LEVOTHROID) 25 MCG tablet   Oral   Take 25 mcg by mouth daily before breakfast.         . metFORMIN (GLUCOPHAGE) 500 MG tablet   Oral   Take 500 mg by mouth at bedtime.         . phentermine (ADIPEX-P) 37.5 MG tablet   Oral   Take 18.75 mg by mouth daily before breakfast.         . topiramate (TOPAMAX) 25 MG tablet   Oral   Take 25 mg by mouth daily.          BP 133/71  Pulse 95  Temp(Src) 97.6 F (36.4 C) (Oral)  Resp 18  Wt 296 lb 8 oz (134.492 kg)  SpO2 100%  LMP 08/17/2013 Physical Exam  Nursing note and vitals reviewed.  Obese 38 year old female, resting comfortably and in no acute  distress. Vital signs are normal. Oxygen saturation is 100%, which is normal. Head is normocephalic and atraumatic. PERRLA, EOMI. Oropharynx is clear. Neck is nontender and supple without adenopathy or JVD. Back is nontender and there is no CVA tenderness. Lungs are clear without rales, wheezes, or rhonchi. Chest is nontender. Heart has regular rate and rhythm without murmur. Abdomen is soft, flat, nontender without masses or hepatosplenomegaly and peristalsis is normoactive. Extremities have no cyanosis or edema, full range of motion is present. Skin is warm and dry without rash. Neurologic: Mental status is normal, cranial nerves are intact, there are no motor or sensory deficits.  ED Course  Procedures (including critical care time) Labs Review Results for orders placed during the hospital encounter of 09/29/13  URINALYSIS, ROUTINE W REFLEX MICROSCOPIC      Result Value Ref Range   Color, Urine AMBER (*) YELLOW   APPearance CLOUDY (*) CLEAR   Specific Gravity, Urine 1.036 (*) 1.005 - 1.030   pH  5.5  5.0 - 8.0   Glucose, UA NEGATIVE  NEGATIVE mg/dL   Hgb urine dipstick TRACE (*) NEGATIVE   Bilirubin Urine SMALL (*) NEGATIVE   Ketones, ur 15 (*) NEGATIVE mg/dL   Protein, ur NEGATIVE  NEGATIVE mg/dL   Urobilinogen, UA 1.0  0.0 - 1.0 mg/dL   Nitrite NEGATIVE  NEGATIVE   Leukocytes, UA NEGATIVE  NEGATIVE  URINE MICROSCOPIC-ADD ON      Result Value Ref Range   Squamous Epithelial / LPF MANY (*) RARE   WBC, UA 3-6  <3 WBC/hpf   RBC / HPF 3-6  <3 RBC/hpf   Bacteria, UA FEW (*) RARE   Casts HYALINE CASTS (*) NEGATIVE   Urine-Other MUCOUS PRESENT    POC URINE PREG, ED      Result Value Ref Range   Preg Test, Ur NEGATIVE  NEGATIVE   MDM   Final diagnoses:  Dysuria    Possible UTI. Urinalysis is pending.  Urinalysis is no evidence of urinary tract infection. She's given a prescription from phenazopyridine for symptomatic treatment is to followup with PCP.  Delora Fuel, MD [phone]

## 2013-09-30 NOTE — ED Notes (Signed)
VS stable. Pt is ambulatory. Pt is being discharged with significant other.

## 2013-09-30 NOTE — ED Notes (Signed)
Pt provided water, MDP approved.

## 2013-09-30 NOTE — ED Notes (Addendum)
I & O cath not completed. Pt provided urine.

## 2013-09-30 NOTE — Discharge Instructions (Signed)
Dysuria Dysuria is the medical term for pain with urination. There are many causes for dysuria, but urinary tract infection is the most common. If a urinalysis was performed it can show that there is a urinary tract infection. A urine culture confirms that you or your child is sick. You will need to follow up with a healthcare provider because:  If a urine culture was done you will need to know the culture results and treatment recommendations.  If the urine culture was positive, you or your child will need to be put on antibiotics or know if the antibiotics prescribed are the right antibiotics for your urinary tract infection.  If the urine culture is negative (no urinary tract infection), then other causes may need to be explored or antibiotics need to be stopped. Today laboratory work may have been done and there does not seem to be an infection. If cultures were done they will take at least 24 to 48 hours to be completed. Today x-rays may have been taken and they read as normal. No cause can be found for the problems. The x-rays may be re-read by a radiologist and you will be contacted if additional findings are made. You or your child may have been put on medications to help with this problem until you can see your primary caregiver. If the problems get better, see your primary caregiver if the problems return. If you were given antibiotics (medications which kill germs), take all of the mediations as directed for the full course of treatment.  If laboratory work was done, you need to find the results. Leave a telephone number where you can be reached. If this is not possible, make sure you find out how you are to get test results. HOME CARE INSTRUCTIONS   Drink lots of fluids. For adults, drink eight, 8 ounce glasses of clear juice or water a day. For children, replace fluids as suggested by your caregiver.  Empty the bladder often. Avoid holding urine for long periods of time.  After a bowel  movement, women should cleanse front to back, using each tissue only once.  Empty your bladder before and after sexual intercourse.  Take all the medicine given to you until it is gone. You may feel better in a few days, but TAKE ALL MEDICINE.  Avoid caffeine, tea, alcohol and carbonated beverages, because they tend to irritate the bladder.  In men, alcohol may irritate the prostate.  Only take over-the-counter or prescription medicines for pain, discomfort, or fever as directed by your caregiver.  If your caregiver has given you a follow-up appointment, it is very important to keep that appointment. Not keeping the appointment could result in a chronic or permanent injury, pain, and disability. If there is any problem keeping the appointment, you must call back to this facility for assistance. SEEK IMMEDIATE MEDICAL CARE IF:   Back pain develops.  A fever develops.  There is nausea (feeling sick to your stomach) or vomiting (throwing up).  Problems are no better with medications or are getting worse. MAKE SURE YOU:   Understand these instructions.  Will watch your condition.  Will get help right away if you are not doing well or get worse. Document Released: 03/20/2004 Document Revised: 09/14/2011 Document Reviewed: 01/26/2008 [facility] Patient Information 2014 [place].  Phenazopyridine tablets What is this medicine? PHENAZOPYRIDINE (fen az oh PEER i deen) is a pain reliever. It is used to stop the pain, burning, or discomfort caused by infection or irritation  of the urinary tract. This medicine is not an antibiotic. It will not cure a urinary tract infection. This medicine may be used for other purposes; ask your health care provider or pharmacist if you have questions. COMMON BRAND NAME(S): AZO, Azo-100, Azo-Gesic, Azo-Septic, Azo-Standard, Phenazo, Prodium, Pyridium, Urinary Analgesic , Uristat What should I tell my health care provider before I take this  medicine? They need to know if you have any of these conditions: -glucose-6-phosphate dehydrogenase (G6PD) deficiency -kidney disease -an unusual or allergic reaction to phenazopyridine, other medicines, foods, dyes, or preservatives -pregnant or trying to get pregnant -breast-feeding How should I use this medicine? Take this medicine by mouth with a glass of water. Follow the directions on the prescription label. Take after meals. Take your doses at regular intervals. Do not take your medicine more often than directed. Do not skip doses or stop your medicine early even if you feel better. Do not stop taking except on your doctor's advice. Talk to your pediatrician regarding the use of this medicine in children. Special care may be needed. Overdosage: If you think you have taken too much of this medicine contact a poison control center or emergency room at once. NOTE: This medicine is only for you. Do not share this medicine with others. What if I miss a dose? If you miss a dose, take it as soon as you can. If it is almost time for your next dose, take only that dose. Do not take double or extra doses. What may interact with this medicine? Interactions are not expected. This list may not describe all possible interactions. Give your health care provider a list of all the medicines, herbs, non-prescription drugs, or dietary supplements you use. Also tell them if you smoke, drink alcohol, or use illegal drugs. Some items may interact with your medicine. What should I watch for while using this medicine? Tell your doctor or health care professional if your symptoms do not improve or if they get worse. This medicine colors body fluids red. This effect is harmless and will go away after you are done taking the medicine. It will change urine to an dark orange or red color. The red color may stain clothing. Soft contact lenses may become permanently stained. It is best not to wear soft contact lenses  while taking this medicine. If you are diabetic you may get a false positive result for sugar in your urine. Talk to your health care provider. What side effects may I notice from receiving this medicine? Side effects that you should report to your doctor or health care professional as soon as possible: -allergic reactions like skin rash, itching or hives, swelling of the face, lips, or tongue -blue or purple color of the skin -difficulty breathing -fever -less urine -unusual bleeding, bruising -unusual tired, weak -vomiting -yellowing of the eyes or skin Side effects that usually do not require medical attention (report to your doctor or health care professional if they continue or are bothersome): -dark urine -headache -stomach upset This list may not describe all possible side effects. Call your doctor for medical advice about side effects. You may report side effects to FDA at 1-800-FDA-1088. Where should I keep my medicine? Keep out of the reach of children. Store at room temperature between 15 and 30 degrees C (59 and 86 degrees F). Protect from light and moisture. Throw away any unused medicine after the expiration date. NOTE: This sheet is a summary. It may not cover all possible information. If  you have questions about this medicine, talk to your doctor, pharmacist, or health care provider.  2014, [employer]. (2008-01-19 11:04:07)

## 2014-06-14 ENCOUNTER — Encounter (HOSPITAL_COMMUNITY): Payer: Self-pay | Admitting: Emergency Medicine

## 2014-06-14 ENCOUNTER — Emergency Department (HOSPITAL_COMMUNITY)
Admission: EM | Admit: 2014-06-14 | Discharge: 2014-06-14 | Disposition: A | Discharge status: Home / Self Care | Payer: Medicaid Other | Attending: Emergency Medicine | Admitting: Emergency Medicine

## 2014-06-14 DIAGNOSIS — R51 Headache: Secondary | ICD-10-CM | POA: Diagnosis not present

## 2014-06-14 DIAGNOSIS — J45909 Unspecified asthma, uncomplicated: Secondary | ICD-10-CM | POA: Diagnosis not present

## 2014-06-14 DIAGNOSIS — Z880 Allergy status to penicillin: Secondary | ICD-10-CM | POA: Diagnosis not present

## 2014-06-14 DIAGNOSIS — Z79899 Other long term (current) drug therapy: Secondary | ICD-10-CM | POA: Diagnosis not present

## 2014-06-14 DIAGNOSIS — M791 Myalgia: Secondary | ICD-10-CM | POA: Diagnosis not present

## 2014-06-14 DIAGNOSIS — H539 Unspecified visual disturbance: Secondary | ICD-10-CM | POA: Diagnosis not present

## 2014-06-14 DIAGNOSIS — F319 Bipolar disorder, unspecified: Secondary | ICD-10-CM | POA: Diagnosis not present

## 2014-06-14 DIAGNOSIS — Z720 Tobacco use: Secondary | ICD-10-CM | POA: Diagnosis not present

## 2014-06-14 DIAGNOSIS — R519 Headache, unspecified: Secondary | ICD-10-CM

## 2014-06-14 DIAGNOSIS — I10 Essential (primary) hypertension: Secondary | ICD-10-CM | POA: Insufficient documentation

## 2014-06-14 DIAGNOSIS — E669 Obesity, unspecified: Secondary | ICD-10-CM | POA: Insufficient documentation

## 2014-06-14 DIAGNOSIS — E119 Type 2 diabetes mellitus without complications: Secondary | ICD-10-CM | POA: Insufficient documentation

## 2014-06-14 DIAGNOSIS — Z791 Long term (current) use of non-steroidal anti-inflammatories (NSAID): Secondary | ICD-10-CM | POA: Diagnosis not present

## 2014-06-14 DIAGNOSIS — E079 Disorder of thyroid, unspecified: Secondary | ICD-10-CM | POA: Diagnosis not present

## 2014-06-14 DIAGNOSIS — Z3202 Encounter for pregnancy test, result negative: Secondary | ICD-10-CM | POA: Diagnosis not present

## 2014-06-14 DIAGNOSIS — M549 Dorsalgia, unspecified: Secondary | ICD-10-CM | POA: Diagnosis not present

## 2014-06-14 LAB — POC URINE PREG, ED: PREG TEST UR: NEGATIVE

## 2014-06-14 LAB — CBG MONITORING, ED: GLUCOSE-CAPILLARY: 145 mg/dL — AB (ref 70–99)

## 2014-06-14 MED ORDER — SODIUM CHLORIDE 0.9 % IV BOLUS (SEPSIS)
1000.0000 mL | Freq: Once | INTRAVENOUS | Status: AC
  Administered 2014-06-14: 1000 mL via INTRAVENOUS

## 2014-06-14 MED ORDER — DIPHENHYDRAMINE HCL 50 MG/ML IJ SOLN
25.0000 mg | Freq: Once | INTRAMUSCULAR | Status: AC
Start: 2014-06-14 — End: 2014-06-14
  Administered 2014-06-14: 25 mg via INTRAVENOUS
  Filled 2014-06-14: qty 1

## 2014-06-14 MED ORDER — KETOROLAC TROMETHAMINE 30 MG/ML IJ SOLN
30.0000 mg | Freq: Once | INTRAMUSCULAR | Status: AC
  Administered 2014-06-14: 30 mg via INTRAVENOUS
  Filled 2014-06-14: qty 1

## 2014-06-14 MED ORDER — METOCLOPRAMIDE HCL 5 MG/ML IJ SOLN
10.0000 mg | Freq: Once | INTRAMUSCULAR | Status: AC
  Administered 2014-06-14: 10 mg via INTRAVENOUS
  Filled 2014-06-14: qty 2

## 2014-06-14 NOTE — ED Provider Notes (Signed)
CSN: [id number]     Arrival date & time 06/14/14  1022 History   First MD Initiated Contact with Patient 06/14/14 1034     Chief Complaint  Patient presents with  . Headache     (Consider location/radiation/quality/duration/timing/severity/associated sxs/prior Treatment) HPI  JAXIE RACANELLI is a 38 y.o. female with PMH of diabetes controlled with diet, hypertension, asthma, bipolar, fibromyalgia, obesity presenting with headache that started one week ago. Headache is bilateral described as a throbbing sensation. Patient also endorses photophobia and at times blurred vision. Headache developed gradually and is improved with naproxen, gabapentin, ultram. Patient with history of headaches but not recently. She has never seen a neurologist for this. No fevers, chills. Patient with history of neck pain without acute worsening. Patient denies slurred speech, numbness, tingling, weakness. No head injury, lightheadedness or dizziness. No syncope. No chest pain, shortness of breath or worsening of chronic back pain.   Past Medical History  Diagnosis Date  . Diabetes mellitus   . Hypertension   . Asthma   . Bipolar affective disorder   . Fibromyalgia   . Hx of tonsillectomy   . Thyroid disease   . Bronchitis   . Obesity    Past Surgical History  Procedure Laterality Date  . Cesarean section    . Vagina surgery      fistula  . Arm surgey     No family history on file. History  Substance Use Topics  . Smoking status: Current Every Day Smoker -- 0.25 packs/day    Types: Cigarettes  . Smokeless tobacco: Not on file  . Alcohol Use: No   OB History    No data available     Review of Systems  Constitutional: Negative for fever and chills.  HENT: Negative for congestion and rhinorrhea.   Eyes: Positive for visual disturbance.  Respiratory: Negative for cough and shortness of breath.   Cardiovascular: Negative for chest pain.  Gastrointestinal: Negative for nausea, vomiting and  diarrhea.  Musculoskeletal: Positive for back pain. Negative for gait problem.  Skin: Negative for rash.  Neurological: Positive for headaches. Negative for weakness.      Allergies  Penicillins  Home Medications   Prior to Admission medications   Medication Sig Start Date End Date Taking? Authorizing Provider  gabapentin (NEURONTIN) 300 MG capsule Take 900 mg by mouth 2 (two) times daily.    Yes Historical Provider, MD  hydrochlorothiazide (HYDRODIURIL) 25 MG tablet Take 25 mg by mouth daily.   Yes Historical Provider, MD  levothyroxine (SYNTHROID, LEVOTHROID) 25 MCG tablet Take 25 mcg by mouth daily before breakfast.   Yes Historical Provider, MD  naproxen (NAPROSYN) 500 MG tablet Take 500 mg by mouth 2 (two) times daily with a meal.   Yes Historical Provider, MD  traMADol (ULTRAM) 50 MG tablet Take 50 mg by mouth 3 (three) times daily.   Yes Historical Provider, MD  albuterol (PROVENTIL HFA;VENTOLIN HFA) 108 (90 BASE) MCG/ACT inhaler Inhale 2 puffs into the lungs every 6 (six) hours as needed for wheezing or shortness of breath.    Historical Provider, MD  HYDROcodone-acetaminophen (NORCO/VICODIN) 5-325 MG per tablet Take 1 tablet by mouth every 6 (six) hours as needed for moderate pain.    Historical Provider, MD  metFORMIN (GLUCOPHAGE) 500 MG tablet Take 500 mg by mouth at bedtime.    Historical Provider, MD  phenazopyridine (PYRIDIUM) 200 MG tablet Take 1 tablet (200 mg total) by mouth 3 (three) times daily. Patient not taking: Reported  on 06/14/2014 10/30/04   Delora Fuel, MD  phentermine (ADIPEX-P) 37.5 MG tablet Take 18.75 mg by mouth daily before breakfast.    Historical Provider, MD  topiramate (TOPAMAX) 25 MG tablet Take 25 mg by mouth daily.    Historical Provider, MD   BP 133/90 mmHg  Pulse 84  Temp(Src) 98 F (36.7 C) (Oral)  Resp 20  Ht 5\' 7"  (1.702 m)  Wt 308 lb (139.708 kg)  BMI 48.23 kg/m2  SpO2 98%  LMP 02/01/2014 Physical Exam  Constitutional: She appears  well-developed and well-nourished. No distress.  HENT:  Head: Normocephalic and atraumatic.  Mouth/Throat: Oropharynx is clear and moist.  Eyes: Conjunctivae and EOM are normal. Pupils are equal, round, and reactive to light. Right eye exhibits no discharge. Left eye exhibits no discharge.  Neck: Normal range of motion. Neck supple.  No nuchal rigidity  Cardiovascular: Normal rate and regular rhythm.   Pulmonary/Chest: Effort normal and breath sounds normal. No respiratory distress. She has no wheezes.  Abdominal: Soft. Bowel sounds are normal. She exhibits no distension. There is no tenderness.  Neurological: She is alert. No cranial nerve deficit. Coordination normal.  Speech is clear and goal oriented. Peripheral visual fields intact. Strength 5/5 in upper and lower extremities. Sensation intact. Intact rapid alternating movements, finger to nose, and heel to shin. Negative Romberg. No pronator drift. Normal gait.   Skin: Skin is warm and dry. She is not diaphoretic.  Nursing note and vitals reviewed.   ED Course  Procedures (including critical care time) Labs Review Labs Reviewed  CBG MONITORING, ED - Abnormal; Notable for the following:    Glucose-Capillary 145 (*)    All other components within normal limits  POC URINE PREG, ED    Imaging Review No results found.   EKG Interpretation None      MDM   Final diagnoses:  Acute nonintractable headache, unspecified headache type   Pt HA treated and improved while in ED.  Presentation is like pt's typical HA, gradual in onset, not maximal in onset, and not worse of life. No visual or speech changes, no N/V, and no weakness. Pt is afebrile with no focal neuro deficits or nuchal rigidity. I doubt SAH, ICH, meningits. Pt is to follow up with PCP to discuss prophylactic medication. Pt verbalizes understanding and is agreeable with plan to dc.   Discussed return precautions with patient. Discussed all results and patient  verbalizes understanding and agrees with plan.  Case has been discussed with  Dr. Regenia Skeeter who agrees with the above plan and to discharge.        Pura Spice, PA-C 06/14/14 1405  Ephraim Hamburger, MD 06/15/14 [phone]

## 2014-06-14 NOTE — ED Notes (Signed)
Pt reports headache that began over the weekend. Reports headache goes down to neck. Reports blurred vision and seeing spots if she turns her head too quickly. Reports hx of headaches as a child but no followup. Stroke scale neg, VSS.

## 2014-06-14 NOTE — Discharge Instructions (Signed)
Return to the emergency room with worsening of symptoms, new symptoms or with symptoms that are concerning, especially severe worsening of headache, visual or speech changes, weakness in face, arms or legs. Ibuprofen 400mg  (2 tablets 200mg ) every 5-6 hours for 3-5 days and then as needed for pain. Call to make a follow-up appointment with your primary care provider soon as possible.

## 2014-08-02 ENCOUNTER — Emergency Department (HOSPITAL_COMMUNITY)
Admission: EM | Admit: 2014-08-02 | Discharge: 2014-08-02 | Disposition: A | Discharge status: Home / Self Care | Payer: Medicaid Other | Attending: Emergency Medicine | Admitting: Emergency Medicine

## 2014-08-02 ENCOUNTER — Encounter (HOSPITAL_COMMUNITY): Payer: Self-pay

## 2014-08-02 DIAGNOSIS — F319 Bipolar disorder, unspecified: Secondary | ICD-10-CM | POA: Insufficient documentation

## 2014-08-02 DIAGNOSIS — E119 Type 2 diabetes mellitus without complications: Secondary | ICD-10-CM | POA: Insufficient documentation

## 2014-08-02 DIAGNOSIS — E079 Disorder of thyroid, unspecified: Secondary | ICD-10-CM | POA: Diagnosis not present

## 2014-08-02 DIAGNOSIS — Z791 Long term (current) use of non-steroidal anti-inflammatories (NSAID): Secondary | ICD-10-CM | POA: Diagnosis not present

## 2014-08-02 DIAGNOSIS — M797 Fibromyalgia: Secondary | ICD-10-CM | POA: Insufficient documentation

## 2014-08-02 DIAGNOSIS — Z880 Allergy status to penicillin: Secondary | ICD-10-CM | POA: Diagnosis not present

## 2014-08-02 DIAGNOSIS — L02411 Cutaneous abscess of right axilla: Secondary | ICD-10-CM | POA: Diagnosis present

## 2014-08-02 DIAGNOSIS — E669 Obesity, unspecified: Secondary | ICD-10-CM | POA: Diagnosis not present

## 2014-08-02 DIAGNOSIS — I10 Essential (primary) hypertension: Secondary | ICD-10-CM | POA: Insufficient documentation

## 2014-08-02 DIAGNOSIS — Z79899 Other long term (current) drug therapy: Secondary | ICD-10-CM | POA: Insufficient documentation

## 2014-08-02 DIAGNOSIS — J45909 Unspecified asthma, uncomplicated: Secondary | ICD-10-CM | POA: Diagnosis not present

## 2014-08-02 DIAGNOSIS — Z720 Tobacco use: Secondary | ICD-10-CM | POA: Insufficient documentation

## 2014-08-02 LAB — CBG MONITORING, ED: Glucose-Capillary: 123 mg/dL — ABNORMAL HIGH (ref 70–99)

## 2014-08-02 MED ORDER — LIDOCAINE HCL 2 % IJ SOLN
5.0000 mL | Freq: Once | INTRAMUSCULAR | Status: AC
  Administered 2014-08-02: 100 mg
  Filled 2014-08-02: qty 20

## 2014-08-02 MED ORDER — SULFAMETHOXAZOLE-TRIMETHOPRIM 800-160 MG PO TABS: 1.0000 | ORAL_TABLET | Freq: Two times a day (BID) | ORAL | Status: AC

## 2014-08-02 MED ORDER — HYDROCODONE-ACETAMINOPHEN 5-325 MG PO TABS: 1.0000 | ORAL_TABLET | Freq: Four times a day (QID) | ORAL | Status: DC | PRN

## 2014-08-02 MED ORDER — LIDOCAINE HCL (PF) 1 % IJ SOLN: 5.0000 mL | Freq: Once | INTRAMUSCULAR | Status: DC

## 2014-08-02 MED ORDER — LIDOCAINE-EPINEPHRINE (PF) 2 %-1:200000 IJ SOLN
20.0000 mL | Freq: Once | INTRAMUSCULAR | Status: DC
  Filled 2014-08-02: qty 20

## 2014-08-02 MED ORDER — HYDROCODONE-ACETAMINOPHEN 5-325 MG PO TABS
1.0000 | ORAL_TABLET | Freq: Once | ORAL | Status: AC
  Administered 2014-08-02: 1 via ORAL
  Filled 2014-08-02: qty 1

## 2014-08-02 NOTE — ED Notes (Signed)
Pt reporting abscess to right axilla.  Sts she noticed it today.  Denies drainage, fever or chills. Sts she has had a prior abscess there before.

## 2014-08-02 NOTE — Discharge Instructions (Signed)
Please call your doctor for a followup appointment within 24-48 hours. When you talk to your doctor please let them know that you were seen in the emergency department and have them acquire all of your records so that they can discuss the findings with you and formulate a treatment plan to fully care for your new and ongoing problems. Please follow-up with the emergency department within approximately 48 hours for site to be reassessed Please rest and stay hydrated Please apply warm compressions and massage Please take antibiotics as prescribed on a full stomach Please take medications as prescribed - while on pain medications there is to be no drinking alcohol, driving, operating any heavy machinery. If extra please dispose in a proper manner. Please do not take any extra Tylenol with this medication for this can lead to Tylenol overdose and liver issues.  Please continue to monitor symptoms closely and if symptoms are to worsen or change (fever greater than 101, chills, sweating, nausea, vomiting, chest pain, shortness of breathe, difficulty breathing, weakness, numbness, tingling, worsening or changes to pain pattern, swelling to the armpit, red streaks running down the arm, worsening or changes to symptoms, swelling) please report back to the Emergency Department immediately.    Abscess An abscess is an infected area that contains a collection of pus and debris.It can occur in almost any part of the body. An abscess is also known as a furuncle or boil. CAUSES  An abscess occurs when tissue gets infected. This can occur from blockage of oil or sweat glands, infection of hair follicles, or a minor injury to the skin. As the body tries to fight the infection, pus collects in the area and creates pressure under the skin. This pressure causes pain. People with weakened immune systems have difficulty fighting infections and get certain abscesses more often.  SYMPTOMS Usually an abscess develops on the  skin and becomes a painful mass that is red, warm, and tender. If the abscess forms under the skin, you may feel a moveable soft area under the skin. Some abscesses break open (rupture) on their own, but most will continue to get worse without care. The infection can spread deeper into the body and eventually into the bloodstream, causing you to feel ill.  DIAGNOSIS  Your caregiver will take your medical history and perform a physical exam. A sample of fluid may also be taken from the abscess to determine what is causing your infection. TREATMENT  Your caregiver may prescribe antibiotic medicines to fight the infection. However, taking antibiotics alone usually does not cure an abscess. Your caregiver may need to make a small cut (incision) in the abscess to drain the pus. In some cases, gauze is packed into the abscess to reduce pain and to continue draining the area. HOME CARE INSTRUCTIONS   Only take over-the-counter or prescription medicines for pain, discomfort, or fever as directed by your caregiver.  If you were prescribed antibiotics, take them as directed. Finish them even if you start to feel better.  If gauze is used, follow your caregiver's directions for changing the gauze.  To avoid spreading the infection:  Keep your draining abscess covered with a bandage.  Wash your hands well.  Do not share personal care items, towels, or whirlpools with others.  Avoid skin contact with others.  Keep your skin and clothes clean around the abscess.  Keep all follow-up appointments as directed by your caregiver. SEEK MEDICAL CARE IF:   You have increased pain, swelling, redness, fluid  drainage, or bleeding.  You have muscle aches, chills, or a general ill feeling.  You have a fever. MAKE SURE YOU:   Understand these instructions.  Will watch your condition.  Will get help right away if you are not doing well or get worse. Document Released: 04/01/2005 Document Revised:  12/22/2011 Document Reviewed: 09/04/2011 [facility] Patient Information 2015 [place], [place]. This information is not intended to replace advice given to you by your health care provider. Make sure you discuss any questions you have with your health care provider.  Abscess Care After An abscess (also called a boil or furuncle) is an infected area that contains a collection of pus. Signs and symptoms of an abscess include pain, tenderness, redness, or hardness, or you may feel a moveable soft area under your skin. An abscess can occur anywhere in the body. The infection may spread to surrounding tissues causing cellulitis. A cut (incision) by the surgeon was made over your abscess and the pus was drained out. Gauze may have been packed into the space to provide a drain that will allow the cavity to heal from the inside outwards. The boil may be painful for 5 to 7 days. Most people with a boil do not have high fevers. Your abscess, if seen early, may not have localized, and may not have been lanced. If not, another appointment may be required for this if it does not get better on its own or with medications. HOME CARE INSTRUCTIONS   Only take over-the-counter or prescription medicines for pain, discomfort, or fever as directed by your caregiver.  When you bathe, soak and then remove gauze or iodoform packs at least daily or as directed by your caregiver. You may then wash the wound gently with mild soapy water. Repack with gauze or do as your caregiver directs. SEEK IMMEDIATE MEDICAL CARE IF:   You develop increased pain, swelling, redness, drainage, or bleeding in the wound site.  You develop signs of generalized infection including muscle aches, chills, fever, or a general ill feeling.  An oral temperature above 102 F (38.9 C) develops, not controlled by medication. See your caregiver for a recheck if you develop any of the symptoms described above. If medications (antibiotics) were prescribed,  take them as directed. Document Released: 01/08/2005 Document Revised: 09/14/2011 Document Reviewed: 09/05/2007 [facility] Patient Information 2015 [place], [place]. This information is not intended to replace advice given to you by your health care provider. Make sure you discuss any questions you have with your health care provider.

## 2014-08-02 NOTE — ED Provider Notes (Signed)
CSN: [id number]     Arrival date & time 08/02/14  1346 History  This chart was scribed for non-physician practitioner, Jamse Mead, PA-C, working with Jasper Riling. Alvino Chapel, MD, by Stephania Fragmin, ED Scribe. This patient was seen in room TR10C/TR10C and the patient's care was started at 3:45 PM.    Chief Complaint  Patient presents with  . Abscess   The history is provided by the patient. No language interpreter was used.     HPI Comments: Ruth [name] is a 39 y.o. female with a history of DM, hypertension, asthma, bipolar disorder, fibromyalgia, thyroid disease, bronchitis, and obesity, who presents to the Emergency Department complaining of a worsening abscess on her right underarm that began yesterday. She complains of associated severe, sharp, throbbing, pain that disrupted sleep. Patient has taken a warm shower, and has taken gabapentin and Tramadol which alleviated her pain. She has had similar abscesses before that have had to be drained. She denies fever, chills, erythematous streaking, drainage, bleeding, numbness, or tingling. Patient had an allergic reaction to penicillin when she was a child. She denies allergies to Lidocaine or iodine.  PCP Dr. Kennon Holter  Past Medical History  Diagnosis Date  . Diabetes mellitus   . Hypertension   . Asthma   . Bipolar affective disorder   . Fibromyalgia   . Hx of tonsillectomy   . Thyroid disease   . Bronchitis   . Obesity    Past Surgical History  Procedure Laterality Date  . Cesarean section    . Vagina surgery      fistula  . Arm surgey     History reviewed. No pertinent family history. History  Substance Use Topics  . Smoking status: Current Every Day Smoker -- 0.25 packs/day    Types: Cigarettes  . Smokeless tobacco: Not on file  . Alcohol Use: No   OB History    No data available     Review of Systems  Constitutional: Negative for fever and chills.  Skin: Positive for wound (abscess). Negative for color change.   Neurological: Negative for weakness and numbness.      Allergies  Penicillins  Home Medications   Prior to Admission medications   Medication Sig Start Date End Date Taking? Authorizing Provider  albuterol (PROVENTIL HFA;VENTOLIN HFA) 108 (90 BASE) MCG/ACT inhaler Inhale 2 puffs into the lungs every 6 (six) hours as needed for wheezing or shortness of breath.    Historical Provider, MD  gabapentin (NEURONTIN) 300 MG capsule Take 900 mg by mouth 2 (two) times daily.     Historical Provider, MD  hydrochlorothiazide (HYDRODIURIL) 25 MG tablet Take 25 mg by mouth daily.    Historical Provider, MD  HYDROcodone-acetaminophen (NORCO/VICODIN) 5-325 MG per tablet Take 1 tablet by mouth every 6 (six) hours as needed for moderate pain or severe pain. 08/02/14   Jahden Schara, PA-C  levothyroxine (SYNTHROID, LEVOTHROID) 25 MCG tablet Take 25 mcg by mouth daily before breakfast.    Historical Provider, MD  metFORMIN (GLUCOPHAGE) 500 MG tablet Take 500 mg by mouth at bedtime.    Historical Provider, MD  naproxen (NAPROSYN) 500 MG tablet Take 500 mg by mouth 2 (two) times daily with a meal.    Historical Provider, MD  phenazopyridine (PYRIDIUM) 200 MG tablet Take 1 tablet (200 mg total) by mouth 3 (three) times daily. Patient not taking: Reported on 06/14/2014 10/13/79   Delora Fuel, MD  phentermine (ADIPEX-P) 37.5 MG tablet Take 18.75 mg by mouth daily before  breakfast.    Historical Provider, MD  sulfamethoxazole-trimethoprim (BACTRIM DS,SEPTRA DS) 800-160 MG per tablet Take 1 tablet by mouth 2 (two) times daily. 08/02/14 08/09/14  Kylani Wires, PA-C  topiramate (TOPAMAX) 25 MG tablet Take 25 mg by mouth daily.    Historical Provider, MD  traMADol (ULTRAM) 50 MG tablet Take 50 mg by mouth 3 (three) times daily.    Historical Provider, MD   BP 142/96 mmHg  Pulse 89  Temp(Src) 97.9 F (36.6 C) (Oral)  Resp 18  SpO2 98% Physical Exam  Constitutional: She is oriented to person, place, and time. She  appears well-developed and well-nourished. No distress.  HENT:  Head: Normocephalic and atraumatic.  Eyes: Conjunctivae and EOM are normal. Pupils are equal, round, and reactive to light. Right eye exhibits no discharge. Left eye exhibits no discharge.  Neck: Normal range of motion. Neck supple. No tracheal deviation present.  Cardiovascular: Normal rate, regular rhythm and normal heart sounds.  Exam reveals no friction rub.   No murmur heard. Pulses:      Radial pulses are 2+ on the right side, and 2+ on the left side.  Pulmonary/Chest: Effort normal and breath sounds normal. No respiratory distress. She has no wheezes. She has no rales.  Musculoskeletal: Normal range of motion.  Lymphadenopathy:    She has no cervical adenopathy.  Neurological: She is alert and oriented to person, place, and time. No cranial nerve deficit. She exhibits normal muscle tone. Coordination normal.  Skin: Skin is warm and dry. No rash noted. She is not diaphoretic. There is erythema.  Approximate 5 cm x 5 cm indurated abscess localized to the right axilla with negative surrounding cellulitic infection. Small pustule identified with negative active drainage or bleeding. Tenderness upon palpation. Negative red streaks. Scar tissue palpated throughout the right axilla secondary to history of abscesses in continuous I&D procedures.  Psychiatric: She has a normal mood and affect. Her behavior is normal. Thought content normal.  Nursing note and vitals reviewed.   ED Course  Procedures (including critical care time)  DIAGNOSTIC STUDIES: Oxygen Saturation is 97% on room air, normal by my interpretation.      INCISION AND DRAINAGE PROCEDURE NOTE: Patient identification was confirmed and verbal consent was obtained. This procedure was performed by Jamse Mead, PA-C, working with Jasper Riling. Alvino Chapel, MD at 3:54 PM. Site: Right axilla Sterile procedures observed Needle size: 25 Anesthetic used (type and amt):  2% Lidocaine without Epi - 3 mL - negative flash with pullback  Drainage: copious Complexity: Complex Packing used none Site anesthetized, incision made over site, wound drained and explored loculations, rinsed with copious amounts of normal saline, wound packed with sterile gauze, covered with dry, sterile dressing.  Pt tolerated procedure well without complications.  Instructions for care discussed verbally and pt provided with additional written instructions for homecare and f/u.   Labs Review Labs Reviewed  CBG MONITORING, ED - Abnormal; Notable for the following:    Glucose-Capillary 123 (*)    All other components within normal limits    Imaging Review No results found.   EKG Interpretation None      MDM   Final diagnoses:  Abscess of right axilla   Medications  lidocaine (XYLOCAINE) 2 % (with pres) injection 100 mg (100 mg Infiltration Given 08/02/14 1544)  HYDROcodone-acetaminophen (NORCO/VICODIN) 5-325 MG per tablet 1 tablet (1 tablet Oral Given 08/02/14 1626)    Filed Vitals:   08/02/14 1408 08/02/14 1620  BP: 142/84 142/96  Pulse:  94 89  Temp: 98 F (36.7 C) 97.9 F (36.6 C)  TempSrc: Oral   Resp: 20 18  SpO2: 97% 98%   I personally performed the services described in this documentation, which was scribed in my presence. The recorded information has been reviewed and is accurate.  CBG 123.  Patient presenting to emergency department with abscess localized the right axilla. Negative signs of surrounding cellulitic infection. Negative active drainage or bleeding noted. I&D procedure performed with copious amounts of purulent drainage. Patient tolerated procedure well. Patient stable, afebrile. Patient not septic appearing. Discharged patient. Discharged patient with antibiotics and pain medications - discussed course, precautions, disposal technique. Discussed with patient to rest, apply warm compressions and massage. Discussed with patient to report back to  the ED to be re-assessed within 48 hours. Discussed with patient to closely monitor symptoms and if symptoms are to worsen or change to report back to the ED - strict return instructions given.  Patient agreed to plan of care, understood, all questions answered.    Jamse Mead, PA-C 08/02/14 [place] Alvino Chapel, MD 08/03/14 0730

## 2014-11-19 ENCOUNTER — Encounter: Payer: Medicaid Other | Attending: Family Medicine | Admitting: Skilled Nursing Facility1

## 2014-11-19 ENCOUNTER — Encounter: Payer: Self-pay | Admitting: Skilled Nursing Facility1

## 2014-11-19 VITALS — Ht 67.0 in | Wt 316.0 lb

## 2014-11-19 DIAGNOSIS — Z6841 Body Mass Index (BMI) 40.0 and over, adult: Secondary | ICD-10-CM | POA: Insufficient documentation

## 2014-11-19 DIAGNOSIS — Z713 Dietary counseling and surveillance: Secondary | ICD-10-CM | POA: Insufficient documentation

## 2014-11-19 DIAGNOSIS — E669 Obesity, unspecified: Secondary | ICD-10-CM | POA: Diagnosis present

## 2014-11-19 NOTE — Patient Instructions (Signed)
-  Try to cut back on your pepsi by drinking sweet tea made with splenda

## 2014-11-19 NOTE — Progress Notes (Signed)
  Medical Nutrition Therapy:  Appt start time: 1000 end time:  1100.   Assessment:  Primary concerns today: Referred for obesity. Pt states her doctor told her she needed to lose weight. Pt states she was not sure if she even cared to come. Pt states this weight is new for her, this is the most weight she has been but states she gained 50-60 pounds out of nowhere over time; started gaining the weight about 8 years ago. Pt has had 5 children. Pt states she attempted to lose wt by phentermine, some type of fruit pill the actors are taking-phentermine and fruit pill makes her sick and shaky. Pt states she sleeps between 6-8 hours of restful sleep. Pt states she distractedly eats. 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    Pt was texting on her phone and accompanied by her husband and son.   Preferred Learning Style:   Auditory  Learning Readiness:   Not ready  MEDICATIONS: metformin   DIETARY INTAKE:  Usual eating pattern includes 1 meals and multiple snacks per day.  Everyday foods include none stated.  Avoided foods include none stated.    24-hr recall:  B ( AM): none Snk ( AM): none L ( PM): sandwhich-----none Snk ( PM): none D ( PM): tacos-----large salad Snk ( PM): chips, candies, anything quick Beverages: pepsi, water, juice, sweet tea, coolaid with sugar  Usual physical activity: ADL's  Estimated energy needs: 1600 calories 180 g carbohydrates 120 g protein 44 g fat  Progress Towards Goal(s):  In progress.   Nutritional Diagnosis:  [place]-3.3 Overweight/obesity As related to overconsumption of high calorie snacks and suagry beverages.  As evidenced by pt report and BMI 49.49.    Intervention:  Nutrition counseling for obesity. Dietitian educated the pt on a balanced and varied diet, the importance of physical activity, and the importance of eating throughout the day.  Goals: -Try to eat 3 meals a day and 2-3 snacks a day -Try to decrease the sugar in  your diet via your beverages  Teaching Method Utilized:  Visual Auditory  Handouts given during visit include:  MyPlate  15 gram carbohydrate snacks  Barriers to learning/adherence to lifestyle change: lack of interest in change  Demonstrated degree of understanding via:  Teach Back   Monitoring/Evaluation:  Dietary intake, exercise, and body weight prn.

## 2015-02-12 ENCOUNTER — Encounter (HOSPITAL_COMMUNITY): Payer: Self-pay | Admitting: Emergency Medicine

## 2015-02-12 ENCOUNTER — Emergency Department (HOSPITAL_BASED_OUTPATIENT_CLINIC_OR_DEPARTMENT_OTHER): Payer: Medicaid Other

## 2015-02-12 ENCOUNTER — Emergency Department (HOSPITAL_COMMUNITY)
Admission: EM | Admit: 2015-02-12 | Discharge: 2015-02-12 | Disposition: A | Discharge status: Home / Self Care | Payer: Medicaid Other | Attending: Emergency Medicine | Admitting: Emergency Medicine

## 2015-02-12 DIAGNOSIS — Z79899 Other long term (current) drug therapy: Secondary | ICD-10-CM | POA: Insufficient documentation

## 2015-02-12 DIAGNOSIS — M7989 Other specified soft tissue disorders: Secondary | ICD-10-CM

## 2015-02-12 DIAGNOSIS — Z720 Tobacco use: Secondary | ICD-10-CM | POA: Diagnosis not present

## 2015-02-12 DIAGNOSIS — F319 Bipolar disorder, unspecified: Secondary | ICD-10-CM | POA: Insufficient documentation

## 2015-02-12 DIAGNOSIS — Z880 Allergy status to penicillin: Secondary | ICD-10-CM | POA: Diagnosis not present

## 2015-02-12 DIAGNOSIS — M797 Fibromyalgia: Secondary | ICD-10-CM | POA: Insufficient documentation

## 2015-02-12 DIAGNOSIS — R2243 Localized swelling, mass and lump, lower limb, bilateral: Secondary | ICD-10-CM | POA: Insufficient documentation

## 2015-02-12 DIAGNOSIS — E669 Obesity, unspecified: Secondary | ICD-10-CM | POA: Insufficient documentation

## 2015-02-12 DIAGNOSIS — I10 Essential (primary) hypertension: Secondary | ICD-10-CM | POA: Diagnosis not present

## 2015-02-12 DIAGNOSIS — E039 Hypothyroidism, unspecified: Secondary | ICD-10-CM | POA: Insufficient documentation

## 2015-02-12 DIAGNOSIS — M79671 Pain in right foot: Secondary | ICD-10-CM | POA: Diagnosis not present

## 2015-02-12 DIAGNOSIS — R600 Localized edema: Secondary | ICD-10-CM

## 2015-02-12 DIAGNOSIS — M79672 Pain in left foot: Secondary | ICD-10-CM | POA: Insufficient documentation

## 2015-02-12 DIAGNOSIS — E119 Type 2 diabetes mellitus without complications: Secondary | ICD-10-CM | POA: Insufficient documentation

## 2015-02-12 DIAGNOSIS — J45909 Unspecified asthma, uncomplicated: Secondary | ICD-10-CM | POA: Insufficient documentation

## 2015-02-12 LAB — COMPREHENSIVE METABOLIC PANEL
ALBUMIN: 3.7 g/dL (ref 3.5–5.0)
ALT: 14 U/L (ref 14–54)
AST: 18 U/L (ref 15–41)
Alkaline Phosphatase: 76 U/L (ref 38–126)
Anion gap: 10 (ref 5–15)
BILIRUBIN TOTAL: 0.5 mg/dL (ref 0.3–1.2)
BUN: 7 mg/dL (ref 6–20)
CO2: 25 mmol/L (ref 22–32)
Calcium: 9.1 mg/dL (ref 8.9–10.3)
Chloride: 105 mmol/L (ref 101–111)
Creatinine, Ser: 0.75 mg/dL (ref 0.44–1.00)
GFR calc non Af Amer: >60 mL/min (ref >60)
Glucose, Bld: 112 mg/dL — ABNORMAL HIGH (ref 65–99)
POTASSIUM: 3.7 mmol/L (ref 3.5–5.1)
Sodium: 140 mmol/L (ref 135–145)
Total Protein: 7.1 g/dL (ref 6.5–8.1)

## 2015-02-12 LAB — CBC
HCT: 33.2 % — ABNORMAL LOW (ref 36.0–46.0)
HEMOGLOBIN: 10.6 g/dL — AB (ref 12.0–15.0)
MCH: 25.7 pg — AB (ref 26.0–34.0)
MCHC: 31.9 g/dL (ref 30.0–36.0)
MCV: 80.6 fL (ref 78.0–100.0)
PLATELETS: 309 10*3/uL (ref 150–400)
RBC: 4.12 MIL/uL (ref 3.87–5.11)
RDW: 14.2 % (ref 11.5–15.5)
WBC: 4.6 10*3/uL (ref 4.0–10.5)

## 2015-02-12 NOTE — Progress Notes (Signed)
VASCULAR LAB PRELIMINARY  PRELIMINARY  PRELIMINARY  PRELIMINARY  Right lower extremity venous duplex completed.    Preliminary report:  Right:  No evidence of DVT, superficial thrombosis, or Baker's cyst.  Clarann Helvey, RVS 02/12/2015, 1:36 PM

## 2015-02-12 NOTE — ED Notes (Signed)
Patient transported to Vascular 

## 2015-02-12 NOTE — ED Notes (Signed)
Patient returned from Vascular 

## 2015-02-12 NOTE — ED Notes (Signed)
Pt. Stated, I started having both my feet to swell and my right leg is hurting

## 2015-02-12 NOTE — Discharge Instructions (Signed)
It was our pleasure to provide your ER care today - we hope that you feel better.  Limit salt intake - see instructions below.  Elevate legs periodically throughout the day, as needed.  Follow up with primary care doctor in the next 1-2 weeks for recheck.  Return to ER if worse, new symptoms, trouble breathing, other concern.    Peripheral Edema You have swelling in your legs (peripheral edema). This swelling is due to excess accumulation of salt and water in your body. Edema may be a sign of heart, kidney or liver disease, or a side effect of a medication. It may also be due to problems in the leg veins. Elevating your legs and using special support stockings may be very helpful, if the cause of the swelling is due to poor venous circulation. Avoid long periods of standing, whatever the cause. Treatment of edema depends on identifying the cause. Chips, pretzels, pickles and other salty foods should be avoided. Restricting salt in your diet is almost always needed. Water pills (diuretics) are often used to remove the excess salt and water from your body via urine. These medicines prevent the kidney from reabsorbing sodium. This increases urine flow. Diuretic treatment may also result in lowering of potassium levels in your body. Potassium supplements may be needed if you have to use diuretics daily. Daily weights can help you keep track of your progress in clearing your edema. You should call your caregiver for follow up care as recommended. SEEK IMMEDIATE MEDICAL CARE IF:   You have increased swelling, pain, redness, or heat in your legs.  You develop shortness of breath, especially when lying down.  You develop chest or abdominal pain, weakness, or fainting.  You have a fever. Document Released: 07/30/2004 Document Revised: 09/14/2011 Document Reviewed: 07/10/2009 [facility] Patient Information 2015 [place], [place]. This information is not intended to replace advice given to you by your  health care provider. Make sure you discuss any questions you have with your health care provider.    Low-Sodium Eating Plan Sodium raises blood pressure and causes water to be held in the body. Getting less sodium from food will help lower your blood pressure, reduce any swelling, and protect your heart, liver, and kidneys. We get sodium by adding salt (sodium chloride) to food. Most of our sodium comes from canned, boxed, and frozen foods. Restaurant foods, fast foods, and pizza are also very high in sodium. Even if you take medicine to lower your blood pressure or to reduce fluid in your body, getting less sodium from your food is important. WHAT IS MY PLAN? Most people should limit their sodium intake to 2,300 mg a day. Your health care provider recommends that you limit your sodium intake to __________ a day.  WHAT DO I NEED TO KNOW ABOUT THIS EATING PLAN? For the low-sodium eating plan, you will follow these general guidelines:  Choose foods with a % Daily Value for sodium of less than 5% (as listed on the food label).   Use salt-free seasonings or herbs instead of table salt or sea salt.   Check with your health care provider or pharmacist before using salt substitutes.   Eat fresh foods.  Eat more vegetables and fruits.  Limit canned vegetables. If you do use them, rinse them well to decrease the sodium.   Limit cheese to 1 oz (28 g) per day.   Eat lower-sodium products, often labeled as "lower sodium" or "no salt added."  Avoid foods that contain monosodium  glutamate (MSG). MSG is sometimes added to [place] food and some canned foods.  Check food labels (Nutrition Facts labels) on foods to learn how much sodium is in one serving.  Eat more home-cooked food and less restaurant, buffet, and fast food.  When eating at a restaurant, ask that your food be prepared with less salt or none, if possible.  HOW DO I READ FOOD LABELS FOR SODIUM INFORMATION? The Nutrition  Facts label lists the amount of sodium in one serving of the food. If you eat more than one serving, you must multiply the listed amount of sodium by the number of servings. Food labels may also identify foods as:  Sodium free--Less than 5 mg in a serving.  Very low sodium--35 mg or less in a serving.  Low sodium--140 mg or less in a serving.  Light in sodium--50% less sodium in a serving. For example, if a food that usually has 300 mg of sodium is changed to become light in sodium, it will have 150 mg of sodium.  Reduced sodium--25% less sodium in a serving. For example, if a food that usually has 400 mg of sodium is changed to reduced sodium, it will have 300 mg of sodium. WHAT FOODS CAN I EAT? Grains Low-sodium cereals, including oats, puffed wheat and rice, and shredded wheat cereals. Low-sodium crackers. Unsalted rice and pasta. Lower-sodium bread.  Vegetables Frozen or fresh vegetables. Low-sodium or reduced-sodium canned vegetables. Low-sodium or reduced-sodium tomato sauce and paste. Low-sodium or reduced-sodium tomato and vegetable juices.  Fruits Fresh, frozen, and canned fruit. Fruit juice.  Meat and Other Protein Products Low-sodium canned tuna and salmon. Fresh or frozen meat, poultry, seafood, and fish. Lamb. Unsalted nuts. Dried beans, peas, and lentils without added salt. Unsalted canned beans. Homemade soups without salt. Eggs.  Dairy Milk. Soy milk. Ricotta cheese. Low-sodium or reduced-sodium cheeses. Yogurt.  Condiments Fresh and dried herbs and spices. Salt-free seasonings. Onion and garlic powders. Low-sodium varieties of mustard and ketchup. Lemon juice.  Fats and Oils Reduced-sodium salad dressings. Unsalted butter.  Other Unsalted popcorn and pretzels.  The items listed above may not be a complete list of recommended foods or beverages. Contact your dietitian for more options. WHAT FOODS ARE NOT RECOMMENDED? Grains Instant hot cereals. Bread  stuffing, pancake, and biscuit mixes. Croutons. Seasoned rice or pasta mixes. Noodle soup cups. Boxed or frozen macaroni and cheese. Self-rising flour. Regular salted crackers. Vegetables Regular canned vegetables. Regular canned tomato sauce and paste. Regular tomato and vegetable juices. Frozen vegetables in sauces. Salted french fries. Olives. Angie Fava. Relishes. Sauerkraut. Salsa. Meat and Other Protein Products Salted, canned, smoked, spiced, or pickled meats, seafood, or fish. Bacon, ham, sausage, hot dogs, corned beef, chipped beef, and packaged luncheon meats. Salt pork. Jerky. Pickled herring. Anchovies, regular canned tuna, and sardines. Salted nuts. Dairy Processed cheese and cheese spreads. Cheese curds. Blue cheese and cottage cheese. Buttermilk.  Condiments Onion and garlic salt, seasoned salt, table salt, and sea salt. Canned and packaged gravies. Worcestershire sauce. Tartar sauce. Barbecue sauce. Teriyaki sauce. Soy sauce, including reduced sodium. Steak sauce. Fish sauce. Oyster sauce. Cocktail sauce. Horseradish. Regular ketchup and mustard. Meat flavorings and tenderizers. Bouillon cubes. Hot sauce. Tabasco sauce. Marinades. Taco seasonings. Relishes. Fats and Oils Regular salad dressings. Salted butter. Margarine. Ghee. Bacon fat.  Other Potato and tortilla chips. Corn chips and puffs. Salted popcorn and pretzels. Canned or dried soups. Pizza. Frozen entrees and pot pies.  The items listed above may not be a complete  list of foods and beverages to avoid. Contact your dietitian for more information. Document Released: 12/12/2001 Document Revised: 06/27/2013 Document Reviewed: 04/26/2013 [facility] Patient Information 2015 [place], [place]. This information is not intended to replace advice given to you by your health care provider. Make sure you discuss any questions you have with your health care provider.

## 2015-02-12 NOTE — ED Provider Notes (Signed)
CSN: [id number]     Arrival date & time 02/12/15  1022 History   First MD Initiated Contact with Patient 02/12/15 1129     Chief Complaint  Patient presents with  . Leg Swelling  . Foot Pain     (Consider location/radiation/quality/duration/timing/severity/associated sxs/prior Treatment) Patient is a 39 y.o. female presenting with lower extremity pain. The history is provided by the patient.  Foot Pain Pertinent negatives include no chest pain, no abdominal pain, no headaches and no shortness of breath.  Patient c/o bilateral leg swelling for the past couple days. Moderate, constant. Notes hx intermittent foot swelling in past, but that now right leg is painful/swollen. Denies skin changes, lesions or redness. No fever or chills. No injury. Denies hx dvt or pe. No chest pain or sob.      Past Medical History  Diagnosis Date  . Diabetes mellitus   . Hypertension   . Asthma   . Bipolar affective disorder   . Fibromyalgia   . Hx of tonsillectomy   . Thyroid disease   . Bronchitis   . Obesity    Past Surgical History  Procedure Laterality Date  . Cesarean section    . Vagina surgery      fistula  . Arm surgey     No family history on file. History  Substance Use Topics  . Smoking status: Current Every Day Smoker -- 0.25 packs/day    Types: Cigarettes  . Smokeless tobacco: Not on file  . Alcohol Use: No   OB History    No data available     Review of Systems  Constitutional: Negative for fever and chills.  HENT: Negative for sore throat.   Eyes: Negative for redness.  Respiratory: Negative for shortness of breath.   Cardiovascular: Positive for leg swelling. Negative for chest pain.  Gastrointestinal: Negative for vomiting, abdominal pain and diarrhea.  Genitourinary: Negative for flank pain.  Musculoskeletal: Negative for back pain and neck pain.  Skin: Negative for rash.  Neurological: Negative for headaches.  Hematological: Does not bruise/bleed easily.   Psychiatric/Behavioral: Negative for confusion.      Allergies  Penicillins  Home Medications   Prior to Admission medications   Medication Sig Start Date End Date Taking? Authorizing Provider  albuterol (PROVENTIL HFA;VENTOLIN HFA) 108 (90 BASE) MCG/ACT inhaler Inhale 2 puffs into the lungs every 6 (six) hours as needed for wheezing or shortness of breath.    Historical Provider, MD  gabapentin (NEURONTIN) 300 MG capsule Take 900 mg by mouth 2 (two) times daily.     Historical Provider, MD  hydrochlorothiazide (HYDRODIURIL) 25 MG tablet Take 25 mg by mouth daily.    Historical Provider, MD  HYDROcodone-acetaminophen (NORCO/VICODIN) 5-325 MG per tablet Take 1 tablet by mouth every 6 (six) hours as needed for moderate pain or severe pain. Patient not taking: Reported on 11/19/2014 08/02/14   Marissa Sciacca, PA-C  levothyroxine (SYNTHROID, LEVOTHROID) 25 MCG tablet Take 25 mcg by mouth daily before breakfast.    Historical Provider, MD  metFORMIN (GLUCOPHAGE) 500 MG tablet Take 500 mg by mouth at bedtime.    Historical Provider, MD  naproxen (NAPROSYN) 500 MG tablet Take 500 mg by mouth 2 (two) times daily with a meal.    Historical Provider, MD  phenazopyridine (PYRIDIUM) 200 MG tablet Take 1 tablet (200 mg total) by mouth 3 (three) times daily. Patient not taking: Reported on 11/19/2014 5/78/46   Delora Fuel, MD  phentermine (ADIPEX-P) 37.5 MG tablet Take 18.75  mg by mouth daily before breakfast.    Historical Provider, MD  topiramate (TOPAMAX) 25 MG tablet Take 25 mg by mouth daily.    Historical Provider, MD  traMADol (ULTRAM) 50 MG tablet Take 50 mg by mouth 3 (three) times daily.    Historical Provider, MD   BP 116/84 mmHg  Pulse 84  Temp(Src) 97.8 F (36.6 C) (Oral)  Resp 18  Ht 5\' 6"  (1.676 m)  Wt 317 lb (143.79 kg)  BMI 51.19 kg/m2  SpO2 99%  LMP 02/08/2015 Physical Exam  Constitutional: She appears well-developed and well-nourished. No distress.  HENT:  Mouth/Throat:  Oropharynx is clear and moist.  Eyes: Conjunctivae are normal. No scleral icterus.  Neck: Neck supple. No tracheal deviation present.  Cardiovascular: Normal rate, regular rhythm, normal heart sounds and intact distal pulses.  Exam reveals no gallop and no friction rub.   No murmur heard. Pulmonary/Chest: Effort normal and breath sounds normal. No respiratory distress.  Abdominal: Soft. Normal appearance and bowel sounds are normal. She exhibits no distension. There is no tenderness.  obese  Musculoskeletal: She exhibits edema.  Bilateral lower leg/ankle edema. Appears symmetric. Distal pulses palp bil.   Neurological: She is alert.  Ambulates w steady gait.   Skin: Skin is warm and dry. No rash noted. She is not diaphoretic.  No cellulitis/rash.   Psychiatric: She has a normal mood and affect.  Nursing note and vitals reviewed.   ED Course  Procedures (including critical care time) Labs Review  Results for orders placed or performed during the hospital encounter of 02/12/15  CBC  Result Value Ref Range   WBC 4.6 4.0 - 10.5 K/uL   RBC 4.12 3.87 - 5.11 MIL/uL   Hemoglobin 10.6 (L) 12.0 - 15.0 g/dL   HCT 33.2 (L) 36.0 - 46.0 %   MCV 80.6 78.0 - 100.0 fL   MCH 25.7 (L) 26.0 - 34.0 pg   MCHC 31.9 30.0 - 36.0 g/dL   RDW 14.2 11.5 - 15.5 %   Platelets 309 150 - 400 K/uL  Comprehensive metabolic panel  Result Value Ref Range   Sodium 140 135 - 145 mmol/L   Potassium 3.7 3.5 - 5.1 mmol/L   Chloride 105 101 - 111 mmol/L   CO2 25 22 - 32 mmol/L   Glucose, Bld 112 (H) 65 - 99 mg/dL   BUN 7 6 - 20 mg/dL   Creatinine, Ser 0.75 0.44 - 1.00 mg/dL   Calcium 9.1 8.9 - 10.3 mg/dL   Total Protein 7.1 6.5 - 8.1 g/dL   Albumin 3.7 3.5 - 5.0 g/dL   AST 18 15 - 41 U/L   ALT 14 14 - 54 U/L   Alkaline Phosphatase 76 38 - 126 U/L   Total Bilirubin 0.5 0.3 - 1.2 mg/dL   GFR calc non Af Amer >60 >60 mL/min   GFR calc Af Amer >60 >60 mL/min   Anion gap 10 5 - 15      Patient Name Sex DOB  SSN   Vianny, Schraeder Female 1975/08/31 [id number]    Progress Notes by Dareen Piano at 02/12/2015 1:36 PM    Author: Dareen Piano Service: Vascular Lab Author Type: Cardiovascular Sonographer   Filed: 02/12/2015 1:38 PM Note Time: 02/12/2015 1:36 PM Status: Signed   Editor: Vilma Prader Slaughter (Cardiovascular Sonographer)     Expand All Collapse All   VASCULAR LAB PRELIMINARY PRELIMINARY PRELIMINARY PRELIMINARY  Right lower extremity venous duplex completed.   Preliminary  report: Right: No evidence of DVT, superficial thrombosis, or Baker's cyst.  SLAUGHTER, [place]IR[place], RVS 02/12/2015, 1:36 PM         MDM   Labs.  Reviewed nursing notes and prior charts for additional history.   Vascular dopplers neg for dvt.  rec no added salt to diet, leg elevation, and pcp f/u.  Recheck pt breathing comfortably, no pain, no distress. Pt currently appears stable for d/c.      Lajean Saver, MD 02/12/15 [id number]

## 2015-07-02 ENCOUNTER — Emergency Department (HOSPITAL_COMMUNITY)
Admission: EM | Admit: 2015-07-02 | Discharge: 2015-07-02 | Disposition: A | Discharge status: Home / Self Care | Payer: Medicaid Other | Attending: Emergency Medicine | Admitting: Emergency Medicine

## 2015-07-02 ENCOUNTER — Encounter (HOSPITAL_COMMUNITY): Payer: Self-pay | Admitting: Vascular Surgery

## 2015-07-02 DIAGNOSIS — J3489 Other specified disorders of nose and nasal sinuses: Secondary | ICD-10-CM | POA: Insufficient documentation

## 2015-07-02 DIAGNOSIS — Z9089 Acquired absence of other organs: Secondary | ICD-10-CM | POA: Diagnosis not present

## 2015-07-02 DIAGNOSIS — R51 Headache: Secondary | ICD-10-CM | POA: Insufficient documentation

## 2015-07-02 DIAGNOSIS — N898 Other specified noninflammatory disorders of vagina: Secondary | ICD-10-CM

## 2015-07-02 DIAGNOSIS — F17210 Nicotine dependence, cigarettes, uncomplicated: Secondary | ICD-10-CM | POA: Diagnosis not present

## 2015-07-02 DIAGNOSIS — Z8659 Personal history of other mental and behavioral disorders: Secondary | ICD-10-CM | POA: Insufficient documentation

## 2015-07-02 DIAGNOSIS — Z79899 Other long term (current) drug therapy: Secondary | ICD-10-CM | POA: Insufficient documentation

## 2015-07-02 DIAGNOSIS — E119 Type 2 diabetes mellitus without complications: Secondary | ICD-10-CM | POA: Insufficient documentation

## 2015-07-02 DIAGNOSIS — I10 Essential (primary) hypertension: Secondary | ICD-10-CM | POA: Diagnosis not present

## 2015-07-02 DIAGNOSIS — J45909 Unspecified asthma, uncomplicated: Secondary | ICD-10-CM | POA: Diagnosis not present

## 2015-07-02 DIAGNOSIS — L293 Anogenital pruritus, unspecified: Secondary | ICD-10-CM | POA: Diagnosis not present

## 2015-07-02 DIAGNOSIS — H53149 Visual discomfort, unspecified: Secondary | ICD-10-CM | POA: Diagnosis not present

## 2015-07-02 DIAGNOSIS — R519 Headache, unspecified: Secondary | ICD-10-CM

## 2015-07-02 DIAGNOSIS — E669 Obesity, unspecified: Secondary | ICD-10-CM | POA: Diagnosis not present

## 2015-07-02 DIAGNOSIS — Z791 Long term (current) use of non-steroidal anti-inflammatories (NSAID): Secondary | ICD-10-CM | POA: Insufficient documentation

## 2015-07-02 DIAGNOSIS — Z880 Allergy status to penicillin: Secondary | ICD-10-CM | POA: Diagnosis not present

## 2015-07-02 DIAGNOSIS — G8929 Other chronic pain: Secondary | ICD-10-CM

## 2015-07-02 LAB — WET PREP, GENITAL
Clue Cells Wet Prep HPF POC: NONE SEEN
Sperm: NONE SEEN
Trich, Wet Prep: NONE SEEN
Yeast Wet Prep HPF POC: NONE SEEN

## 2015-07-02 MED ORDER — KETOROLAC TROMETHAMINE 60 MG/2ML IM SOLN
60.0000 mg | Freq: Once | INTRAMUSCULAR | Status: AC
  Administered 2015-07-02: 60 mg via INTRAMUSCULAR
  Filled 2015-07-02: qty 2

## 2015-07-02 MED ORDER — FLUCONAZOLE 200 MG PO TABS: 200.0000 mg | ORAL_TABLET | Freq: Every day | ORAL | Status: AC

## 2015-07-02 NOTE — Discharge Instructions (Signed)
Please read and follow all provided instructions.  Your diagnoses today include:  1. Chronic nonintractable headache, unspecified headache type   2. Vaginal itching     Tests performed today include:  Vital signs. See below for your results today.   Medications prescribed:   None   Home care instructions:  Follow any educational materials contained in this packet.  Follow-up instructions: Please follow-up with your primary care provider in the next 48 hours for further evaluation of symptoms and treatment   Return instructions:   Please return to the Emergency Department if you do not get better, if you get worse, or new symptoms OR  - Fever (temperature greater than 101.60F)  - Bleeding that does not stop with holding pressure to the area    -Severe pain (please note that you may be more sore the day after your accident)  - Chest Pain  - Difficulty breathing  - Severe nausea or vomiting  - Inability to tolerate food and liquids  - Passing out  - Skin becoming red around your wounds  - Change in mental status (confusion or lethargy)  - New numbness or weakness     Please return if you have any other emergent concerns.  Additional Information:  Your vital signs today were: BP 135/87 mmHg   Pulse 89   Temp(Src) 98 F (36.7 C) (Oral)   Resp 20   SpO2 100% If your blood pressure (BP) was elevated above 135/85 this visit, please have this repeated by your doctor within one month. ---------------

## 2015-07-02 NOTE — ED Notes (Signed)
Pt reports to the ED for eval of intermittent HAs x 1 week. She reports sometimes light and sound make her HA worse and she has associated nausea. Pt also reports vaginal itching and irritation. She denies any vaginal d/c or bleeding. States she has tried Teacher, adult education but it has not helped. Last time she had to have 4 Diflucan pills before her yeast infection resolved. Pt A&Ox4, resp e/u, and skin warm and dry.

## 2015-07-02 NOTE — ED Provider Notes (Signed)
CSN: [id number]     Arrival date & time 07/02/15  1147 History   First MD Initiated Contact with Patient 07/02/15 1607     Chief Complaint  Patient presents with  . Headache  . Vaginitis   (Consider location/radiation/quality/duration/timing/severity/associated sxs/prior Treatment) HPI 39 y.o. female with no sig pmh, presents to the Emergency Department today complaining of a headache and vaginitis. The headache has lastied x1 week. Radiates from the forehead to the back of the head. Feels like a dull throbbing. Has had these headaches in the past, but this one has lasted a little longer. Has taken ibuprofen and Benadryl with little relief. Reports some photophobia as well. Pt also reports vaginal itching for the past 8-9 days. No discharge or bleeding. Tried Monistat 7 day with no relief. She is sexually active with one partner. Reports similar symptoms and given Diflucan PO for her symptoms to resolve.  Past Medical History  Diagnosis Date  . Diabetes mellitus   . Hypertension   . Asthma   . Bipolar affective disorder ([place])   . Fibromyalgia   . Hx of tonsillectomy   . Thyroid disease   . Bronchitis   . Obesity    Past Surgical History  Procedure Laterality Date  . Cesarean section    . Vagina surgery      fistula  . Arm surgey     No family history on file. Social History  Substance Use Topics  . Smoking status: Current Every Day Smoker -- 0.25 packs/day    Types: Cigarettes  . Smokeless tobacco: None  . Alcohol Use: No   OB History    No data available     Review of Systems  Constitutional: Negative for fever, chills, diaphoresis and fatigue.  HENT: Negative for congestion, sinus pressure, sore throat and tinnitus.   Eyes: Positive for photophobia. Negative for visual disturbance.  Respiratory: Negative for cough and shortness of breath.   Cardiovascular: Negative for chest pain.  Gastrointestinal: Negative for nausea, vomiting, abdominal pain, diarrhea and  constipation.  Endocrine: Negative for cold intolerance and heat intolerance.  Genitourinary: Negative for vaginal bleeding, vaginal discharge and vaginal pain.  Musculoskeletal: Negative for back pain.  Skin: Negative for color change.  Neurological: Positive for headaches. Negative for dizziness, syncope, weakness and numbness.   Allergies  Penicillins  Home Medications   Prior to Admission medications   Medication Sig Start Date End Date Taking? Authorizing Provider  albuterol (PROVENTIL HFA;VENTOLIN HFA) 108 (90 BASE) MCG/ACT inhaler Inhale 2 puffs into the lungs every 6 (six) hours as needed for wheezing or shortness of breath.    Historical Provider, MD  diphenhydrAMINE (BENADRYL) 25 MG tablet Take 25 mg by mouth every 6 (six) hours as needed for allergies.    Historical Provider, MD  naproxen (NAPROSYN) 500 MG tablet Take 500 mg by mouth 2 (two) times daily with a meal.    Historical Provider, MD  naproxen sodium (ANAPROX) 220 MG tablet Take 440 mg by mouth 2 (two) times daily with a meal.    Historical Provider, MD   BP 128/77 mmHg  Pulse 94  Temp(Src) 97.6 F (36.4 C) (Oral)  Resp 16  SpO2 100%   Physical Exam  Constitutional: She is oriented to person, place, and time. She appears well-developed and well-nourished.  HENT:  Head: Normocephalic and atraumatic.  Right Ear: Hearing, tympanic membrane, external ear and ear canal normal.  Left Ear: Hearing, tympanic membrane, external ear and ear canal normal.  Nose:  Right sinus exhibits frontal sinus tenderness. Left sinus exhibits frontal sinus tenderness.  Mouth/Throat: Uvula is midline, oropharynx is clear and moist and mucous membranes are normal.  Eyes: Conjunctivae and EOM are normal. Pupils are equal, round, and reactive to light.  Neck: Trachea normal. Neck supple.  Cardiovascular: Normal rate and regular rhythm.   Pulmonary/Chest: Effort normal and breath sounds normal.  Abdominal: Soft.  Lymphadenopathy:    She  has no cervical adenopathy.  Neurological: She is alert and oriented to person, place, and time. She has normal strength. No cranial nerve deficit or sensory deficit.  Skin: Skin is warm and dry.  Psychiatric: She has a normal mood and affect. Her speech is normal and behavior is normal. Thought content normal.   Exam performed by Ozella Rocks,  exam chaperoned Date: 07/02/2015 Pelvic exam: normal external genitalia without evidence of trauma. VULVA: normal appearing vulva with no masses, tenderness or lesion. VAGINA: normal appearing vagina with normal color and discharge, no lesions. CERVIX: normal appearing cervix without lesions, cervical motion tenderness absent, cervical os closed with out purulent discharge; vaginal discharge - white, Wet prep and DNA probe for chlamydia and GC obtained.   ADNEXA: normal adnexa in size, nontender and no masses UTERUS: uterus is normal size, shape, consistency and nontender.    ED Course  Procedures (including critical care time) Labs Review Labs Reviewed  WET PREP, GENITAL - Abnormal; Notable for the following:    WBC, Wet Prep HPF POC FEW (*)    All other components within normal limits  GC/CHLAMYDIA PROBE AMP ([place]) NOT AT [facility]   Imaging Review No results found. I have personally reviewed and evaluated these images and lab results as part of my medical decision-making.   EKG Interpretation None      MDM  I have reviewed relevant laboratory values.  I have reviewed the relevant previous healthcare records. I obtained HPI from historian. Cases discussed with Attending Physician  ED Course: Given 60 IM Toradol. Previous records indicate good renal function.   Assessment: 30y F with hx DM presents with H/A and Vaginal itching. Pt in NAD with Stable VS. Will give Diflucan for Vaginitis and Toradol IM for H/A. Pt exhibits no motor or sensory deficits. Cranial nerves intact and no changes in vision. Have her follow up with PCP for  further management of headache symptoms. Physical exam and symptoms most likely suggest viral sinusitis     Disposition/Plan:  Discahrged with PO Diflucan x7days. Told pt to try OTC antihistamines and decongestants to help with sinus pressure and headache. Told to f/u with PCP in 48 hours for further management.  Additional Verbal discharge instructions given and discussed with patient.  Pt Instructed to f/u with PCP in the next 48 hours for evaluation and treatment of symptoms.    Patient was discussed with Noemi Chapel, MD   Final diagnoses:  Chronic nonintractable headache, unspecified headache type  Vaginal itching       Shary Decamp, PA-C 07/03/15 1118  Noemi Chapel, MD 07/06/15 2206

## 2015-07-06 LAB — GC/CHLAMYDIA PROBE AMP (~~LOC~~) NOT AT ARMC
Chlamydia: NEGATIVE
NEISSERIA GONORRHEA: NEGATIVE

## 2015-09-23 ENCOUNTER — Encounter (HOSPITAL_COMMUNITY): Payer: Self-pay

## 2015-09-23 ENCOUNTER — Emergency Department (HOSPITAL_COMMUNITY)
Admission: EM | Admit: 2015-09-23 | Discharge: 2015-09-23 | Disposition: A | Discharge status: Home / Self Care | Payer: Medicaid Other | Attending: Emergency Medicine | Admitting: Emergency Medicine

## 2015-09-23 ENCOUNTER — Emergency Department (HOSPITAL_COMMUNITY): Payer: Medicaid Other

## 2015-09-23 DIAGNOSIS — E119 Type 2 diabetes mellitus without complications: Secondary | ICD-10-CM | POA: Insufficient documentation

## 2015-09-23 DIAGNOSIS — E669 Obesity, unspecified: Secondary | ICD-10-CM | POA: Insufficient documentation

## 2015-09-23 DIAGNOSIS — S90121A Contusion of right lesser toe(s) without damage to nail, initial encounter: Secondary | ICD-10-CM | POA: Insufficient documentation

## 2015-09-23 DIAGNOSIS — Y9389 Activity, other specified: Secondary | ICD-10-CM | POA: Insufficient documentation

## 2015-09-23 DIAGNOSIS — I10 Essential (primary) hypertension: Secondary | ICD-10-CM | POA: Diagnosis not present

## 2015-09-23 DIAGNOSIS — Y998 Other external cause status: Secondary | ICD-10-CM | POA: Insufficient documentation

## 2015-09-23 DIAGNOSIS — Y9289 Other specified places as the place of occurrence of the external cause: Secondary | ICD-10-CM | POA: Insufficient documentation

## 2015-09-23 DIAGNOSIS — Z8739 Personal history of other diseases of the musculoskeletal system and connective tissue: Secondary | ICD-10-CM | POA: Insufficient documentation

## 2015-09-23 DIAGNOSIS — J45909 Unspecified asthma, uncomplicated: Secondary | ICD-10-CM | POA: Insufficient documentation

## 2015-09-23 DIAGNOSIS — F17210 Nicotine dependence, cigarettes, uncomplicated: Secondary | ICD-10-CM | POA: Diagnosis not present

## 2015-09-23 DIAGNOSIS — S90111A Contusion of right great toe without damage to nail, initial encounter: Secondary | ICD-10-CM | POA: Diagnosis not present

## 2015-09-23 DIAGNOSIS — Z880 Allergy status to penicillin: Secondary | ICD-10-CM | POA: Diagnosis not present

## 2015-09-23 DIAGNOSIS — S99921A Unspecified injury of right foot, initial encounter: Secondary | ICD-10-CM | POA: Diagnosis present

## 2015-09-23 DIAGNOSIS — W16212A Fall in (into) filled bathtub causing other injury, initial encounter: Secondary | ICD-10-CM | POA: Insufficient documentation

## 2015-09-23 MED ORDER — IBUPROFEN 400 MG PO TABS
400.0000 mg | ORAL_TABLET | Freq: Once | ORAL | Status: AC
  Administered 2015-09-23: 400 mg via ORAL
  Filled 2015-09-23: qty 1

## 2015-09-23 MED ORDER — NAPROXEN 500 MG PO TABS: 500.0000 mg | ORAL_TABLET | Freq: Two times a day (BID) | ORAL | Status: DC

## 2015-09-23 MED ORDER — HYDROCODONE-ACETAMINOPHEN 5-325 MG PO TABS: 1.0000 | ORAL_TABLET | Freq: Four times a day (QID) | ORAL | Status: DC | PRN

## 2015-09-23 MED ORDER — OXYCODONE-ACETAMINOPHEN 5-325 MG PO TABS
1.0000 | ORAL_TABLET | Freq: Once | ORAL | Status: AC
  Administered 2015-09-23: 1 via ORAL
  Filled 2015-09-23: qty 1

## 2015-09-23 NOTE — ED Provider Notes (Signed)
CSN: [id number]     Arrival date & time 09/23/15  1129 History  By signing my name below, I, Jolayne Panther, attest that this documentation has been prepared under the direction and in the presence of Debroah Baller, NP. Electronically Signed: Jolayne Panther, Scribe. 09/23/2015. 2:16 PM.   Chief Complaint  Patient presents with  . Toe Injury    Patient is a 40 y.o. female presenting with injury. The history is provided by the patient. No language interpreter was used.  Injury This is a new problem. The current episode started 3 to 5 hours ago. The problem occurs constantly. The problem has not changed since onset.Nothing aggravates the symptoms. Nothing relieves the symptoms. She has tried nothing for the symptoms. The treatment provided no relief.    HPI Comments: Ruth [name] is a 40 y.o. female who presents to the Emergency Department complaining of sudden onset, constant, moderate right foot pain and metatarsal pain, which began about 3.5 hours ago after pt slipped and fell out of the bathtub. Pt reports that she broke the toenails of her first two toes and also reports that her pain radiations up into her right lower extremity.   Past Medical History  Diagnosis Date  . Diabetes mellitus   . Hypertension   . Asthma   . Bipolar affective disorder ([place])   . Fibromyalgia   . Hx of tonsillectomy   . Thyroid disease   . Bronchitis   . Obesity    Past Surgical History  Procedure Laterality Date  . Cesarean section    . Vagina surgery      fistula  . Arm surgey     No family history on file. Social History  Substance Use Topics  . Smoking status: Current Every Day Smoker -- 0.25 packs/day    Types: Cigarettes  . Smokeless tobacco: None  . Alcohol Use: No   OB History    No data available     Review of Systems  Musculoskeletal: Positive for myalgias and arthralgias.       Right foot and toes of the right foot  All other systems reviewed and are  negative.  Allergies  Penicillins  Home Medications   Prior to Admission medications   Medication Sig Start Date End Date Taking? Authorizing Provider  albuterol (PROVENTIL HFA;VENTOLIN HFA) 108 (90 BASE) MCG/ACT inhaler Inhale 2 puffs into the lungs every 6 (six) hours as needed for wheezing or shortness of breath.    Historical Provider, MD  HYDROcodone-acetaminophen (NORCO) 5-325 MG tablet Take 1 tablet by mouth every 6 (six) hours as needed. 09/23/15   Hessie Varone Bunnie Pion, NP  naproxen (NAPROSYN) 500 MG tablet Take 1 tablet (500 mg total) by mouth 2 (two) times daily. 09/23/15   Chyenne Sobczak Bunnie Pion, NP   BP 123/80 mmHg  Pulse 89  Temp(Src) 97.7 F (36.5 C) (Oral)  Resp 16  Ht 5\' 7"  (1.702 m)  Wt 139.254 kg  BMI 48.07 kg/m2  SpO2 98%  LMP 09/11/2015 Physical Exam  Constitutional: She is oriented to person, place, and time. She appears well-developed and well-nourished. No distress.  HENT:  Head: Normocephalic and atraumatic.  Eyes: Conjunctivae are normal.  Cardiovascular: Normal rate, regular rhythm and normal heart sounds.   Pulmonary/Chest: Effort normal and breath sounds normal.  Abdominal: Soft. She exhibits no distension. There is no tenderness.  Musculoskeletal:  Pedal pulses 2+  Good circulation Ecchymosis noted to the right great toe and second toe Tenderness with palpation  and any ROM Tenderness to the plantar aspect at the base of the great toe and second toe  Tenderness to the dorsam of the foot at the base of the toes  Full ROM of the ankle without pain   Neurological: She is alert and oriented to person, place, and time.  Skin: Skin is warm and dry.  Psychiatric: She has a normal mood and affect.  Nursing note and vitals reviewed.   ED Course  Procedures  DIAGNOSTIC STUDIES:    Oxygen Saturation is 98% on RA, normal by my interpretation.   COORDINATION OF CARE:  2:15 PM Will administer pain medication in the ED. Will review x-ray. Discussed treatment plan with  pt at bedside and pt agreed to plan.   MDM  40 y.o. female with pain, swelling and ecchymosis to the great and second toes of the right foot stable for d/c without fracture or dislocation noted on x-ray and no focal neuro deficits. Will buddy tape and she will wear post op shoe and apply ice. Will give NSAIDS and a few hydrocodone for pain. Discussed with the patient and all questioned fully answered. She will follow up with ortho if any symptoms persist.    Final diagnoses:  Contusion, toe, right, initial encounter   I personally performed the services described in this documentation, which was scribed in my presence. The recorded information has been reviewed and is accurate.     [place], NP 09/26/15 [place], MD 10/01/15 [phone]

## 2015-09-23 NOTE — ED Notes (Signed)
Pt. Slipped in her bathtub and injured her rt. Great toe and second toe.  Toes and dorsal foot is slightly swollen.   No there injuries noted.

## 2015-09-23 NOTE — ED Notes (Signed)
Ice pack applied.

## 2015-11-07 ENCOUNTER — Emergency Department (HOSPITAL_COMMUNITY)
Admission: EM | Admit: 2015-11-07 | Discharge: 2015-11-07 | Disposition: A | Discharge status: Home / Self Care | Payer: Medicaid Other | Attending: Emergency Medicine | Admitting: Emergency Medicine

## 2015-11-07 ENCOUNTER — Encounter (HOSPITAL_COMMUNITY): Payer: Self-pay

## 2015-11-07 DIAGNOSIS — Z880 Allergy status to penicillin: Secondary | ICD-10-CM | POA: Diagnosis not present

## 2015-11-07 DIAGNOSIS — I10 Essential (primary) hypertension: Secondary | ICD-10-CM | POA: Insufficient documentation

## 2015-11-07 DIAGNOSIS — J45909 Unspecified asthma, uncomplicated: Secondary | ICD-10-CM | POA: Diagnosis not present

## 2015-11-07 DIAGNOSIS — Z79899 Other long term (current) drug therapy: Secondary | ICD-10-CM | POA: Insufficient documentation

## 2015-11-07 DIAGNOSIS — Z8659 Personal history of other mental and behavioral disorders: Secondary | ICD-10-CM | POA: Diagnosis not present

## 2015-11-07 DIAGNOSIS — E119 Type 2 diabetes mellitus without complications: Secondary | ICD-10-CM | POA: Diagnosis not present

## 2015-11-07 DIAGNOSIS — E669 Obesity, unspecified: Secondary | ICD-10-CM | POA: Diagnosis not present

## 2015-11-07 DIAGNOSIS — M79651 Pain in right thigh: Secondary | ICD-10-CM | POA: Diagnosis present

## 2015-11-07 DIAGNOSIS — M5410 Radiculopathy, site unspecified: Secondary | ICD-10-CM | POA: Diagnosis not present

## 2015-11-07 DIAGNOSIS — Z791 Long term (current) use of non-steroidal anti-inflammatories (NSAID): Secondary | ICD-10-CM | POA: Diagnosis not present

## 2015-11-07 DIAGNOSIS — F17210 Nicotine dependence, cigarettes, uncomplicated: Secondary | ICD-10-CM | POA: Insufficient documentation

## 2015-11-07 DIAGNOSIS — Z9089 Acquired absence of other organs: Secondary | ICD-10-CM | POA: Diagnosis not present

## 2015-11-07 MED ORDER — KETOROLAC TROMETHAMINE 60 MG/2ML IM SOLN
30.0000 mg | Freq: Once | INTRAMUSCULAR | Status: AC
  Administered 2015-11-07: 30 mg via INTRAMUSCULAR
  Filled 2015-11-07: qty 2

## 2015-11-07 MED ORDER — HYDROCODONE-ACETAMINOPHEN 5-325 MG PO TABS: ORAL_TABLET | ORAL | Status: DC

## 2015-11-07 NOTE — ED Notes (Addendum)
Pt here with c/o left thigh pain, described as burning. 'Its been going on for a while but today its been worse." pt ambulatory. Denies recent injury but reports standing on constant standing on concrete floor at work all day.

## 2015-11-07 NOTE — Discharge Instructions (Signed)
Take vicodin for breakthrough pain, do not drink alcohol, drive, care for children or do other critical tasks while taking vicodin. ° °Please follow with your primary care doctor in the next 2 days for a check-up. They must obtain records for further management.  ° °Do not hesitate to return to the Emergency Department for any new, worsening or concerning symptoms.  ° °

## 2015-11-07 NOTE — ED Provider Notes (Signed)
CSN: [id number]     Arrival date & time 11/07/15  1658 History  By signing my name below, I, Ruth [name], attest that this documentation has been prepared under the direction and in the presence of non-physician practitioner, Ruth Blitz, PA-C. Electronically Signed: Evelene [name], Scribe. 11/07/2015. 5:53 PM.     Chief Complaint  Patient presents with  . Leg Pain   The history is provided by the patient. No language interpreter was used.   HPI Comments:  Ruth [name] is a 40 y.o. female who presents to the Emergency Department complaining of constant right thigh pain x a few days. Pt states she stands for long periods of time at work. She describes a burning sensation to the site.  She has been taking ibuprofen with little relief. She denies h/o similar pain. She also denies bowel/bladder incontinence, fever and chills. No alleviating factors noted.  Past Medical History  Diagnosis Date  . Diabetes mellitus   . Hypertension   . Asthma   . Bipolar affective disorder ([place])   . Fibromyalgia   . Hx of tonsillectomy   . Thyroid disease   . Bronchitis   . Obesity    Past Surgical History  Procedure Laterality Date  . Cesarean section    . Vagina surgery      fistula  . Arm surgey     No family history on file. Social History  Substance Use Topics  . Smoking status: Current Every Day Smoker -- 0.25 packs/day    Types: Cigarettes  . Smokeless tobacco: None  . Alcohol Use: No   OB History    No data available     Review of Systems  10 systems reviewed and all are negative for acute change except as noted in the HPI.   Allergies  Penicillins  Home Medications   Prior to Admission medications   Medication Sig Start Date End Date Taking? Authorizing Provider  albuterol (PROVENTIL HFA;VENTOLIN HFA) 108 (90 BASE) MCG/ACT inhaler Inhale 2 puffs into the lungs every 6 (six) hours as needed for wheezing or shortness of breath.    Historical Provider, MD   HYDROcodone-acetaminophen (NORCO) 5-325 MG tablet Take 1 tablet by mouth every 6 (six) hours as needed. 09/23/15   Ruth Bunnie Pion, NP  naproxen (NAPROSYN) 500 MG tablet Take 1 tablet (500 mg total) by mouth 2 (two) times daily. 09/23/15   Ruth Bunnie Pion, NP   BP 128/79 mmHg  Pulse 104  Temp(Src) 97.9 F (36.6 C) (Oral)  Resp 20  SpO2 98%  LMP 11/07/2015 Physical Exam  Constitutional: She is oriented to person, place, and time. She appears well-developed and well-nourished. No distress.  Obese  HENT:  Head: Normocephalic and atraumatic.  Eyes: Conjunctivae are normal.  Neck: Normal range of motion.  Cardiovascular: Normal rate, regular rhythm and intact distal pulses.   Pulmonary/Chest: Effort normal.  Abdominal: Soft. She exhibits no distension. There is no tenderness.  Neurological: She is alert and oriented to person, place, and time.  No point tenderness to percussion of lumbar spinal processes.  No TTP or paraspinal muscular spasm. Strength is 5 out of 5 to bilateral lower extremities at hip and knee; extensor hallucis longus 5 out of 5. Ankle strength 5 out of 5, no clonus, neurovascularly intact. No saddle anaesthesia. Patellar reflexes are 2+ bilaterally.    Ambulates with a coordinated in nonantalgic gait  Skin: Skin is warm and dry.  Psychiatric: She has a normal mood and affect.  Nursing note and vitals reviewed.   ED Course  Procedures  DIAGNOSTIC STUDIES:  Oxygen Saturation is 98% on RA, normal by my interpretation.    COORDINATION OF CARE:  5:52 PM Discussed treatment plan with pt at bedside and pt agreed to plan.   MDM   Final diagnoses:  Radicular leg pain    Filed Vitals:   11/07/15 1707  BP: 128/79  Pulse: 104  Temp: 97.9 F (36.6 C)  TempSrc: Oral  Resp: 20  SpO2: 98%    Medications  ketorolac (TORADOL) injection 30 mg (30 mg Intramuscular Given 11/07/15 1818)    Ruth [name] is 40 y.o. female presenting with Burning sensation to lateral  thigh. Neuro exam without abnormality. Likely meralgia paresthetica. Recommend high-dose ibuprofen, will give short course of steroids for pain control, work note provided.  Evaluation does not show pathology that would require ongoing emergent intervention or inpatient treatment. Pt is hemodynamically stable and mentating appropriately. Discussed findings and plan with patient/guardian, who agrees with care plan. All questions answered. Return precautions discussed and outpatient follow up given.   New Prescriptions   HYDROCODONE-ACETAMINOPHEN (NORCO/VICODIN) 5-325 MG TABLET    Take 1-2 tablets by mouth every 6 hours as needed for pain and/or cough.     I personally performed the services described in this documentation, which was scribed in my presence. The recorded information has been reviewed and is accurate.    Ruth Blitz, PA-C 11/07/15 [place], MD 11/08/15 2111

## 2015-11-11 ENCOUNTER — Encounter (HOSPITAL_COMMUNITY): Payer: Self-pay | Admitting: Nurse Practitioner

## 2015-11-11 ENCOUNTER — Ambulatory Visit (HOSPITAL_COMMUNITY)
Admission: EM | Admit: 2015-11-11 | Discharge: 2015-11-11 | Disposition: A | Discharge status: Home / Self Care | Payer: Medicaid Other | Attending: Emergency Medicine | Admitting: Emergency Medicine

## 2015-11-11 ENCOUNTER — Emergency Department (HOSPITAL_COMMUNITY)
Admission: EM | Admit: 2015-11-11 | Discharge: 2015-11-11 | Disposition: A | Discharge status: Home / Self Care | Payer: Medicaid Other | Attending: Emergency Medicine | Admitting: Emergency Medicine

## 2015-11-11 ENCOUNTER — Encounter (HOSPITAL_COMMUNITY): Payer: Self-pay | Admitting: *Deleted

## 2015-11-11 DIAGNOSIS — F17210 Nicotine dependence, cigarettes, uncomplicated: Secondary | ICD-10-CM | POA: Diagnosis not present

## 2015-11-11 DIAGNOSIS — E119 Type 2 diabetes mellitus without complications: Secondary | ICD-10-CM | POA: Diagnosis not present

## 2015-11-11 DIAGNOSIS — J45909 Unspecified asthma, uncomplicated: Secondary | ICD-10-CM | POA: Diagnosis not present

## 2015-11-11 DIAGNOSIS — I10 Essential (primary) hypertension: Secondary | ICD-10-CM | POA: Insufficient documentation

## 2015-11-11 DIAGNOSIS — E669 Obesity, unspecified: Secondary | ICD-10-CM | POA: Diagnosis not present

## 2015-11-11 DIAGNOSIS — R350 Frequency of micturition: Secondary | ICD-10-CM | POA: Insufficient documentation

## 2015-11-11 DIAGNOSIS — N390 Urinary tract infection, site not specified: Secondary | ICD-10-CM | POA: Diagnosis not present

## 2015-11-11 LAB — URINALYSIS, ROUTINE W REFLEX MICROSCOPIC
GLUCOSE, UA: NEGATIVE mg/dL
Ketones, ur: 15 mg/dL — AB
Nitrite: NEGATIVE
Protein, ur: 30 mg/dL — AB
SPECIFIC GRAVITY, URINE: 1.038 — AB (ref 1.005–1.030)
pH: 5.5 (ref 5.0–8.0)

## 2015-11-11 LAB — POC URINE PREG, ED: PREG TEST UR: NEGATIVE

## 2015-11-11 LAB — URINE MICROSCOPIC-ADD ON

## 2015-11-11 MED ORDER — PHENAZOPYRIDINE HCL 200 MG PO TABS: 200.0000 mg | ORAL_TABLET | Freq: Three times a day (TID) | ORAL | Status: DC

## 2015-11-11 MED ORDER — CEPHALEXIN 500 MG PO CAPS: 500.0000 mg | ORAL_CAPSULE | Freq: Four times a day (QID) | ORAL | Status: DC

## 2015-11-11 NOTE — ED Notes (Signed)
She c/o dysuria since this morning. She is concerned for UTI. She was at [facility] this am for this but had to leave due to wait time. She gave a urine sample there.

## 2015-11-11 NOTE — ED Provider Notes (Signed)
CSN: [id number]     Arrival date & time 11/11/15  1853 History   First MD Initiated Contact with Patient 11/11/15 2057     Chief Complaint  Patient presents with  . Urinary Tract Infection   (Consider location/radiation/quality/duration/timing/severity/associated sxs/prior Treatment) HPI She is a 40 year old woman here for evaluation of dysuria. This started this morning. She denies any abdominal pain, flank pain, fevers, or chills. No urinary urgency or frequency. She states she has been holding her urine due to the pain. She initially went to [facility] emergency room and give a urine specimen. She had to leave due to other commitments and came to the urgent care.  Past Medical History  Diagnosis Date  . Diabetes mellitus   . Hypertension   . Asthma   . Bipolar affective disorder ([place])   . Fibromyalgia   . Hx of tonsillectomy   . Thyroid disease   . Bronchitis   . Obesity    Past Surgical History  Procedure Laterality Date  . Cesarean section    . Vagina surgery      fistula  . Arm surgey     History reviewed. No pertinent family history. Social History  Substance Use Topics  . Smoking status: Current Every Day Smoker -- 0.25 packs/day    Types: Cigarettes  . Smokeless tobacco: None  . Alcohol Use: No   OB History    No data available     Review of Systems As in history of present illness Allergies  Penicillins  Home Medications   Prior to Admission medications   Medication Sig Start Date End Date Taking? Authorizing Provider  cephALEXin (KEFLEX) 500 MG capsule Take 1 capsule (500 mg total) by mouth 4 (four) times daily. 11/11/15   Melony Overly, MD  phenazopyridine (PYRIDIUM) 200 MG tablet Take 1 tablet (200 mg total) by mouth 3 (three) times daily. 11/11/15   Melony Overly, MD   Meds Ordered and Administered this Visit  Medications - No data to display  BP 146/93 mmHg  Pulse 78  Temp(Src) 98.1 F (36.7 C) (Oral)  Resp 12  SpO2 100%  LMP 11/07/2015 No data  found.   Physical Exam  Constitutional: She is oriented to person, place, and time. She appears well-developed and well-nourished. No distress.  Cardiovascular: Normal rate.   Pulmonary/Chest: Effort normal.  Abdominal: Soft. She exhibits no distension. There is no tenderness.  No CVA tenderness  Neurological: She is alert and oriented to person, place, and time.    ED Course  Procedures (including critical care time)  Labs Review Labs Reviewed - No data to display  Imaging Review No results found.    MDM   1. UTI (lower urinary tract infection)    I reviewed her UA from the emergency room. It is consistent with infection. We'll treat with Keflex and Pyridium. Follow-up as needed.    Melony Overly, MD 11/11/15 2128

## 2015-11-11 NOTE — ED Notes (Signed)
PT is here with urinary frequency and burning that started today.  Pt states she is on her menstrual today

## 2015-11-11 NOTE — Discharge Instructions (Signed)
You have a UTI. Take Keflex 4 times a day for 3 days. Take the Pyridium for the next 2 days to help numb your bladder. Follow-up as needed.

## 2015-11-11 NOTE — ED Notes (Signed)
Pt left due to how long the wait is and how many people are ahead of her. Nurse was notified.

## 2015-11-21 ENCOUNTER — Encounter (HOSPITAL_COMMUNITY): Payer: Self-pay

## 2015-11-21 ENCOUNTER — Emergency Department (HOSPITAL_COMMUNITY)
Admission: EM | Admit: 2015-11-21 | Discharge: 2015-11-21 | Disposition: A | Discharge status: Home / Self Care | Payer: Medicaid Other | Attending: Emergency Medicine | Admitting: Emergency Medicine

## 2015-11-21 ENCOUNTER — Emergency Department (HOSPITAL_COMMUNITY): Payer: Medicaid Other

## 2015-11-21 DIAGNOSIS — Z6841 Body Mass Index (BMI) 40.0 and over, adult: Secondary | ICD-10-CM | POA: Insufficient documentation

## 2015-11-21 DIAGNOSIS — E119 Type 2 diabetes mellitus without complications: Secondary | ICD-10-CM | POA: Diagnosis not present

## 2015-11-21 DIAGNOSIS — R05 Cough: Secondary | ICD-10-CM | POA: Diagnosis present

## 2015-11-21 DIAGNOSIS — J45909 Unspecified asthma, uncomplicated: Secondary | ICD-10-CM | POA: Insufficient documentation

## 2015-11-21 DIAGNOSIS — F17210 Nicotine dependence, cigarettes, uncomplicated: Secondary | ICD-10-CM | POA: Insufficient documentation

## 2015-11-21 DIAGNOSIS — I10 Essential (primary) hypertension: Secondary | ICD-10-CM | POA: Diagnosis not present

## 2015-11-21 DIAGNOSIS — J069 Acute upper respiratory infection, unspecified: Secondary | ICD-10-CM

## 2015-11-21 DIAGNOSIS — Z79899 Other long term (current) drug therapy: Secondary | ICD-10-CM | POA: Insufficient documentation

## 2015-11-21 DIAGNOSIS — G5711 Meralgia paresthetica, right lower limb: Secondary | ICD-10-CM

## 2015-11-21 MED ORDER — PREDNISONE 50 MG PO TABS: ORAL_TABLET | ORAL | Status: DC

## 2015-11-21 MED ORDER — BENZONATATE 100 MG PO CAPS: 100.0000 mg | ORAL_CAPSULE | Freq: Three times a day (TID) | ORAL | Status: DC

## 2015-11-21 NOTE — ED Provider Notes (Signed)
History  By signing my name below, I, Bea Graff, attest that this documentation has been prepared under the direction and in the presence of [employer], PA-C. Electronically Signed: Bea Graff, ED Scribe. 11/21/2015. 11:16 AM.  Chief Complaint  Patient presents with  . Cough  . thigh pain    The history is provided by the patient and medical records. No language interpreter was used.    HPI Comments:  Ruth [name] is a 40 y.o. morbidly obese female with PMHx of DM, HTN, asthma and fibromyalgia who presents to the Emergency Department complaining of right anterior and lateral thigh burning pain and soreness that began worsening about two weeks ago. She reports associated numbness of the area. She was seen here in the past for similar symptoms. Pt states she began a new job that requires her stand for long periods of time that she believes has caused her pain. She has been taking two Aleve twice daily with minimal relief of the pain. Touching the area or lying on it increases the pain. She denies alleviating factors. She denies lower right leg pain, trauma, injury or fall, calf pain, fever or chills. She states she has a PCP but has not ever seen them. She states she is ambulatory with a limp.  She also reports a moderate, intermittent HA and productive cough of thick sputum that began about three days ago. She reports smoking and states she has a PMHx of bronchitis and is concerned for pneumonia. She has been taking Aleve as previously stated but denies any relief of the HA. She has not taken anything for cough and has not slowed down with smoking. She denies modifying factors of these symptoms. She denies neck pain, nausea, vomiting.  Past Medical History  Diagnosis Date  . Diabetes mellitus   . Hypertension   . Asthma   . Bipolar affective disorder ([place])   . Fibromyalgia   . Hx of tonsillectomy   . Thyroid disease   . Bronchitis   . Obesity    Past Surgical  History  Procedure Laterality Date  . Cesarean section    . Vagina surgery      fistula  . Arm surgey     No family history on file. Social History  Substance Use Topics  . Smoking status: Current Every Day Smoker -- 0.25 packs/day    Types: Cigarettes  . Smokeless tobacco: None  . Alcohol Use: No   OB History    No data available     Review of Systems A complete 10 system review of systems was obtained and all systems are negative except as noted in the HPI and PMH.   Allergies  Penicillins  Home Medications   Prior to Admission medications   Medication Sig Start Date End Date Taking? Authorizing Provider  benzonatate (TESSALON) 100 MG capsule Take 1 capsule (100 mg total) by mouth every 8 (eight) hours. 11/21/15   Consuelo Suthers Tripp Macala Baldonado, PA-C  cephALEXin (KEFLEX) 500 MG capsule Take 1 capsule (500 mg total) by mouth 4 (four) times daily. 11/11/15   Melony Overly, MD  phenazopyridine (PYRIDIUM) 200 MG tablet Take 1 tablet (200 mg total) by mouth 3 (three) times daily. 11/11/15   Melony Overly, MD  predniSONE (DELTASONE) 50 MG tablet Take 1 tablet daily 11/21/15   Carlos Levering, PA-C   Triage Vitals: BP 125/73 mmHg  Pulse 81  Temp(Src) 98 F (36.7 C) (Oral)  Resp 20  Ht 5\' 6"  (1.676  m)  Wt 307 lb (139.254 kg)  BMI 49.57 kg/m2  SpO2 100%  LMP 11/07/2015 Physical Exam  Constitutional: She is oriented to person, place, and time. She appears well-developed and well-nourished. No distress.  HENT:  Head: Normocephalic and atraumatic.  Mouth/Throat: No oropharyngeal exudate.  Eyes: Conjunctivae and EOM are normal. Pupils are equal, round, and reactive to light. Right eye exhibits no discharge. Left eye exhibits no discharge. No scleral icterus.  Neck:  No meningismus   Cardiovascular: Normal rate, regular rhythm, normal heart sounds and intact distal pulses.  Exam reveals no gallop and no friction rub.   No murmur heard. Pulmonary/Chest: Effort normal and breath  sounds normal. No respiratory distress. She has no wheezes. She has no rales. She exhibits no tenderness.  Abdominal: Soft. She exhibits no distension. There is no tenderness. There is no guarding.  Musculoskeletal: Normal range of motion. She exhibits no edema.  TTP of right outer thigh to soft touch. No decrease ROm of R hip or knee.    Neurological: She is alert and oriented to person, place, and time.  Strength 5/5 throughout. No sensory deficits.    Skin: Skin is warm and dry. No rash noted. She is not diaphoretic. No erythema. No pallor.  No ecchymosis or erythema  Psychiatric: She has a normal mood and affect. Her behavior is normal.  Nursing note and vitals reviewed.   ED Course  Procedures (including critical care time) DIAGNOSTIC STUDIES: Oxygen Saturation is 100% on RA, normal by my interpretation.   COORDINATION OF CARE: 10:18 AM- Encouraged pt to continue taking the Aleve. Will prescribe short course of steroids and recommended pt to follow up with her PCP. Will order CXR prior to discharge. Pt verbalizes understanding and agrees to plan.  Medications - No data to display  Labs Review Labs Reviewed - No data to display  Imaging Review Dg Chest 2 View  11/21/2015  CLINICAL DATA:  40 year old female with productive cough chills and shortness of breath for 3 days. Initial encounter. EXAM: CHEST  2 VIEW COMPARISON:  07/22/2012 and earlier. FINDINGS: Chronic large body habitus. Lung volumes are stable and within normal limits. Mediastinal contours are stable with cardiac size at the upper limits of normal. Visualized tracheal air column is within normal limits. No pneumothorax, pulmonary edema, pleural effusion or confluent pulmonary opacity. No acute osseous abnormality identified. IMPRESSION: No acute cardiopulmonary abnormality. Electronically Signed   By: Genevie Ann M.D.   On: 11/21/2015 11:10   I have personally reviewed and evaluated these images and lab results as part of my  medical decision-making.   EKG Interpretation None      MDM   Final diagnoses:  URI (upper respiratory infection)  Meralgia paraesthetica, right    Pt CXR negative for acute infiltrate. Patients symptoms are consistent with URI, likely viral etiology. Discussed that antibiotics are not indicated for viral infections. Pt will be discharged with symptomatic treatment for cough. Pts numbness/tingling in R thigh is likely due to meralgia paresthetica. Recommend pt avoid tight fitting clothing, ice area and continue taking Aleve. Will give short course of steroids for inflammation of nerve. Pt has hx of DM but is currently controlled with diet, she does not require medications any more. Pt takes her glucose at home and they are under control. Pt instructed to take home BG prior to taking steroids. Verbalizes understanding and is agreeable with plan. Pt is hemodynamically stable & in NAD prior to dc.   I personally performed  the services described in this documentation, which was scribed in my presence. The recorded information has been reviewed and is accurate.    Dondra Spry [place], PA-C 11/21/15 1137  Lacretia Leigh, MD 11/23/15 [phone]

## 2015-11-21 NOTE — ED Notes (Signed)
Pt placed into gown 

## 2015-11-21 NOTE — ED Notes (Signed)
Patient here with dry cough x 4 days, complains of frontal headache with same, smoker. Reports on Monday thick mucus was present. Also has ongoing right thigh burning, seen for same and reports ongoing discomfort

## 2015-11-21 NOTE — Discharge Instructions (Signed)
Upper Respiratory Infection, Adult Most upper respiratory infections (URIs) are a viral infection of the air passages leading to the lungs. A URI affects the nose, throat, and upper air passages. The most common type of URI is nasopharyngitis and is typically referred to as "the common cold." URIs run their course and usually go away on their own. Most of the time, a URI does not require medical attention, but sometimes a bacterial infection in the upper airways can follow a viral infection. This is called a secondary infection. Sinus and middle ear infections are common types of secondary upper respiratory infections. Bacterial pneumonia can also complicate a URI. A URI can worsen asthma and chronic obstructive pulmonary disease (COPD). Sometimes, these complications can require emergency medical care and may be life threatening.  CAUSES Almost all URIs are caused by viruses. A virus is a type of germ and can spread from one person to another.  RISKS FACTORS You may be at risk for a URI if:   You smoke.   You have chronic heart or lung disease.  You have a weakened defense (immune) system.   You are very young or very old.   You have nasal allergies or asthma.  You work in crowded or poorly ventilated areas.  You work in health care facilities or schools. SIGNS AND SYMPTOMS  Symptoms typically develop 2-3 days after you come in contact with a cold virus. Most viral URIs last 7-10 days. However, viral URIs from the influenza virus (flu virus) can last 14-18 days and are typically more severe. Symptoms may include:   Runny or stuffy (congested) nose.   Sneezing.   Cough.   Sore throat.   Headache.   Fatigue.   Fever.   Loss of appetite.   Pain in your forehead, behind your eyes, and over your cheekbones (sinus pain).  Muscle aches.  DIAGNOSIS  Your health care provider may diagnose a URI by:  Physical exam.  Tests to check that your symptoms are not due to  another condition such as:  Strep throat.  Sinusitis.  Pneumonia.  Asthma. TREATMENT  A URI goes away on its own with time. It cannot be cured with medicines, but medicines may be prescribed or recommended to relieve symptoms. Medicines may help:  Reduce your fever.  Reduce your cough.  Relieve nasal congestion. HOME CARE INSTRUCTIONS   Take medicines only as directed by your health care provider.   Gargle warm saltwater or take cough drops to comfort your throat as directed by your health care provider.  Use a warm mist humidifier or inhale steam from a shower to increase air moisture. This may make it easier to breathe.  Drink enough fluid to keep your urine clear or pale yellow.   Eat soups and other clear broths and maintain good nutrition.   Rest as needed.   Return to work when your temperature has returned to normal or as your health care provider advises. You may need to stay home longer to avoid infecting others. You can also use a face mask and careful hand washing to prevent spread of the virus.  Increase the usage of your inhaler if you have asthma.   Do not use any tobacco products, including cigarettes, chewing tobacco, or electronic cigarettes. If you need help quitting, ask your health care provider. PREVENTION  The best way to protect yourself from getting a cold is to practice good hygiene.   Avoid oral or hand contact with people with cold  symptoms.   Wash your hands often if contact occurs.  There is no clear evidence that vitamin C, vitamin E, echinacea, or exercise reduces the chance of developing a cold. However, it is always recommended to get plenty of rest, exercise, and practice good nutrition.  SEEK MEDICAL CARE IF:   You are getting worse rather than better.   Your symptoms are not controlled by medicine.   You have chills.  You have worsening shortness of breath.  You have brown or red mucus.  You have yellow or brown nasal  discharge.  You have pain in your face, especially when you bend forward.  You have a fever.  You have swollen neck glands.  You have pain while swallowing.  You have white areas in the back of your throat. SEEK IMMEDIATE MEDICAL CARE IF:   You have severe or persistent:  Headache.  Ear pain.  Sinus pain.  Chest pain.  You have chronic lung disease and any of the following:  Wheezing.  Prolonged cough.  Coughing up blood.  A change in your usual mucus.  You have a stiff neck.  You have changes in your:  Vision.  Hearing.  Thinking.  Mood. MAKE SURE YOU:   Understand these instructions.  Will watch your condition.  Will get help right away if you are not doing well or get worse.   This information is not intended to replace advice given to you by your health care provider. Make sure you discuss any questions you have with your health care provider.   Document Released: 12/16/2000 Document Revised: 11/06/2014 Document Reviewed: 09/27/2013 Elsevier Interactive Patient Education 2016 [place] Nerve A pinched nerve is a type of injury that occurs when too much pressure is placed on a nerve. This pressure can cause pain, burning, and muscle weakness in places such as your arm, hand, back, leg, or neck. A nerve can become permanently damaged if it is severely pinched or if it has been pinched for a long time. CAUSES This condition may be caused by:  The passing of a nerve through a narrow area between bones or other body structures.  Loss of blood supply to a nerve.  A nerve being stretched from an injury.  A sudden injury with swelling.  Wear and tear that occurs over several years.  Changes that occur in the spine with age. SYMPTOMS The most common symptom of a pinched nerve is a tingling feeling or numbness. Other symptoms include:  Pain that radiates from the affected nerve to the body part that the nerve supplies.  A burning  feeling.  Muscle weakness in the muscles supplied by the injured nerve. DIAGNOSIS This condition is diagnosed with a physical exam. During the exam, a health care provider will check for numbness and muscle weakness and move the affected body parts to test for pain. You may also have other tests, such as:  X-rays to check for bone damage.  An MRI or CT scan to check for nerve damage.  Electromyography (EMG) to check for electrical signals passing through nerves to muscles. TREATMENT The first treatment for a pinched nerve is usually rest and supportive devices, such as a splint, brace, or neck collar. Additional treatment depends on symptoms and the amount of nerve damage. This can include:  Medicines, such as:  Numbing medicine injections.  Nonsteroidal anti-inflammatory drugs (NSAIDs).  Steroid medicines in pill form or by injection.  Physical therapy to relieve pain, maintain movement, and improve  muscle strength.  Surgery. This may be done if other treatments do not work. HOME CARE INSTRUCTIONS  Only take medicines as directed by your health care provider.  Wear supportive or protective devices as directed by your health care provider.  Do stretching and strengthening exercises at home as directed by your health care provider.  Rest as needed.  Keep all follow-up visits as directed by your health care provider. This is important. SEEK MEDICAL CARE IF:  Your condition does not improve with treatment.  Your pain, numbness, or weakness suddenly gets worse.   This information is not intended to replace advice given to you by your health care provider. Make sure you discuss any questions you have with your health care provider.   Your chest x-ray was negative for pneumonia. Continue taking home Aleve. Take Tessalon as needed for cough. Encourage smoking cessation as this will make your cough worse.Take steroids as prescribed. Please make sure you are checking your home  blood sugar. Do not take steroids if your sugar is elevated or out of your normal range. Avoid wearing tight fitting clothing and apply ice to thigh. Do not apply heat to this area. You will need to follow up with your primary care provider. If you need a referral to an orthopedic provider this will need to come from your primary care doctor. Return to the ED if you experience severe worsening of your symptoms, fevers, chills, weakness.

## 2015-11-29 ENCOUNTER — Ambulatory Visit (HOSPITAL_COMMUNITY)
Admission: EM | Admit: 2015-11-29 | Discharge: 2015-11-29 | Disposition: A | Discharge status: Home / Self Care | Payer: Medicaid Other | Attending: Emergency Medicine | Admitting: Emergency Medicine

## 2015-11-29 ENCOUNTER — Encounter (HOSPITAL_COMMUNITY): Payer: Self-pay | Admitting: *Deleted

## 2015-11-29 DIAGNOSIS — E669 Obesity, unspecified: Secondary | ICD-10-CM | POA: Insufficient documentation

## 2015-11-29 DIAGNOSIS — J45909 Unspecified asthma, uncomplicated: Secondary | ICD-10-CM | POA: Diagnosis not present

## 2015-11-29 DIAGNOSIS — E119 Type 2 diabetes mellitus without complications: Secondary | ICD-10-CM | POA: Insufficient documentation

## 2015-11-29 DIAGNOSIS — M797 Fibromyalgia: Secondary | ICD-10-CM | POA: Insufficient documentation

## 2015-11-29 DIAGNOSIS — H9209 Otalgia, unspecified ear: Secondary | ICD-10-CM | POA: Diagnosis present

## 2015-11-29 DIAGNOSIS — F17210 Nicotine dependence, cigarettes, uncomplicated: Secondary | ICD-10-CM | POA: Diagnosis not present

## 2015-11-29 DIAGNOSIS — E079 Disorder of thyroid, unspecified: Secondary | ICD-10-CM | POA: Insufficient documentation

## 2015-11-29 DIAGNOSIS — Z880 Allergy status to penicillin: Secondary | ICD-10-CM | POA: Diagnosis not present

## 2015-11-29 DIAGNOSIS — I10 Essential (primary) hypertension: Secondary | ICD-10-CM | POA: Insufficient documentation

## 2015-11-29 DIAGNOSIS — G5711 Meralgia paresthetica, right lower limb: Secondary | ICD-10-CM

## 2015-11-29 DIAGNOSIS — H9201 Otalgia, right ear: Secondary | ICD-10-CM | POA: Diagnosis not present

## 2015-11-29 LAB — POCT RAPID STREP A: STREPTOCOCCUS, GROUP A SCREEN (DIRECT): NEGATIVE

## 2015-11-29 MED ORDER — GABAPENTIN 300 MG PO CAPS: 300.0000 mg | ORAL_CAPSULE | Freq: Every day | ORAL | Status: DC

## 2015-11-29 NOTE — ED Provider Notes (Signed)
CSN: [id number]     Arrival date & time 11/29/15  1639 History   First MD Initiated Contact with Patient 11/29/15 1649     Chief Complaint  Patient presents with  . Otalgia   (Consider location/radiation/quality/duration/timing/severity/associated sxs/prior Treatment) HPI Comments: 40 yo presents with right ear pain that began this am. No fever or chills. Some pain that radiates into jaw. Mild sore throat. No congestion or cough is noted.  She also continues to have "burning" pain in the right thigh. She was seen in the ED with this as well, but has not had a chance to f/u with PCP. It is still bothersome, mainly at night. She states the steroids were not helpful and she is asking for "something else" until she can see her primary MD.  She has DM, but states this has been controlled. No weakness is noted. She carries a history of Fibromyalgia as well.   Patient is a 40 y.o. female presenting with ear pain. The history is provided by the patient.  Otalgia Associated symptoms: sore throat   Associated symptoms: no cough and no fever     Past Medical History  Diagnosis Date  . Diabetes mellitus   . Hypertension   . Asthma   . Bipolar affective disorder ([place])   . Fibromyalgia   . Hx of tonsillectomy   . Thyroid disease   . Bronchitis   . Obesity    Past Surgical History  Procedure Laterality Date  . Cesarean section    . Vagina surgery      fistula  . Arm surgey     No family history on file. Social History  Substance Use Topics  . Smoking status: Current Every Day Smoker -- 0.25 packs/day    Types: Cigarettes  . Smokeless tobacco: None  . Alcohol Use: No   OB History    No data available     Review of Systems  Constitutional: Negative for fever, appetite change and fatigue.  HENT: Positive for ear pain and sore throat. Negative for sinus pressure and sneezing.   Eyes: Negative for itching.  Respiratory: Negative for cough.   Musculoskeletal: Positive for myalgias.  Negative for joint swelling and arthralgias.  Allergic/Immunologic: Positive for environmental allergies.    Allergies  Penicillins  Home Medications   Prior to Admission medications   Medication Sig Start Date End Date Taking? Authorizing Provider  benzonatate (TESSALON) 100 MG capsule Take 1 capsule (100 mg total) by mouth every 8 (eight) hours. 11/21/15   Samantha Tripp Dowless, PA-C  cephALEXin (KEFLEX) 500 MG capsule Take 1 capsule (500 mg total) by mouth 4 (four) times daily. 11/11/15   Melony Overly, MD  gabapentin (NEURONTIN) 300 MG capsule Take 1 capsule (300 mg total) by mouth at bedtime. 11/29/15   Bjorn Pippin, PA-C  phenazopyridine (PYRIDIUM) 200 MG tablet Take 1 tablet (200 mg total) by mouth 3 (three) times daily. 11/11/15   Melony Overly, MD  predniSONE (DELTASONE) 50 MG tablet Take 1 tablet daily 11/21/15   Samantha Tripp Dowless, PA-C   Meds Ordered and Administered this Visit  Medications - No data to display  BP 157/96 mmHg  Pulse 99  Temp(Src) 98.3 F (36.8 C) (Oral)  Resp 16  SpO2 98%  LMP 11/07/2015 No data found.   Physical Exam  Constitutional: She is oriented to person, place, and time. She appears well-developed and well-nourished. No distress.  HENT:  Head: Normocephalic and atraumatic.  Left Ear: External ear  normal.  Right ear with mild retro TM fluid, no retraction or erythema. No frank effusion is noted  Neck: Normal range of motion.  Musculoskeletal: She exhibits no edema or tenderness.  Lymphadenopathy:    She has no cervical adenopathy.  Neurological: She is alert and oriented to person, place, and time. She exhibits normal muscle tone.  Skin: Skin is warm and dry. She is not diaphoretic.  Psychiatric: Her behavior is normal.  Nursing note and vitals reviewed.   ED Course  Procedures (including critical care time)  Labs Review Labs Reviewed  POCT RAPID STREP A    Imaging Review No results found.   Visual Acuity Review  Right  Eye Distance:   Left Eye Distance:   Bilateral Distance:    Right Eye Near:   Left Eye Near:    Bilateral Near:         MDM   1. Meralgia paresthetica, right   2. Ear pain, right    1. Chronic without neuro deficit. Possibly related to underlying Fibromyalgia or diabetes.  Agree with previous work up in the ED 3 days ago without changes since then. Will try Gabapentin, but only a limited supply as she must see her PCP for further refills.  2. Allergies vs. Viral. No signs of a bacterial infection. Treat symptomatically with Claritin and Mucinex. F/U prn.     Bjorn Pippin, PA-C 11/29/15 1735

## 2015-11-29 NOTE — ED Notes (Addendum)
Pt    Has   Several      Complaints      She  Has   Swelling  And  Pain in  Her  r  Thigh         For about  1  Month      Has  Been     Seen in past  For          Similar     Episodes    She  Also  Reports  A  Right  Earache  And  A  sorethroat

## 2015-11-29 NOTE — Discharge Instructions (Signed)
Earache  You have some fluid behind the ear drum. This is usually from a virus or allergies. No signs of an infection. Suggest use of claritin daily and a trial of Mucinex (plain) as directed. F/U if worsens.  Your neuropathic pain needs to be followed by your pcp so that you can find something that you can take to help with this. We can try Gabapentin 300mg  at night. This is helpful for nerve pain, but we cannot give this to you chronically. If it helps then please see your PCP for a refill.    An earache, also called otalgia, can be caused by many things. Pain from an earache can be sharp, dull, or burning. The pain may be temporary or constant. Earaches can be caused by problems with the ear, such as infection in either the middle ear or the ear canal, injury, impacted ear wax, middle ear pressure, or a foreign body in the ear. Ear pain can also result from problems in other areas. This is called referred pain. For example, pain can come from a sore throat, a tooth infection, or problems with the jaw or the joint between the jaw and the skull (temporomandibular joint, or TMJ). The cause of an earache is not always easy to identify. Watchful waiting may be appropriate for some earaches until a clear cause of the pain can be found. HOME CARE INSTRUCTIONS Watch your condition for any changes. The following actions may help to lessen any discomfort that you are feeling:  Take medicines only as directed by your health care provider. This includes ear drops.  Apply ice to your outer ear to help reduce pain.  Put ice in a plastic bag.  Place a towel between your skin and the bag.  Leave the ice on for 20 minutes, 2-3 times per day.  Do not put anything in your ear other than medicine that is prescribed by your health care provider.  Try resting in an upright position instead of lying down. This may help to reduce pressure in the middle ear and relieve pain.  Chew gum if it helps to  relieve your ear pain.  Control any allergies that you have.  Keep all follow-up visits as directed by your health care provider. This is important. SEEK MEDICAL CARE IF:  Your pain does not improve within 2 days.  You have a fever.  You have new or worsening symptoms. SEEK IMMEDIATE MEDICAL CARE IF:  You have a severe headache.  You have a stiff neck.  You have difficulty swallowing.  You have redness or swelling behind your ear.  You have drainage from your ear.  You have hearing loss.  You feel dizzy.   This information is not intended to replace advice given to you by your health care provider. Make sure you discuss any questions you have with your health care provider.   Document Released: 02/07/2004 Document Revised: 07/13/2014 Document Reviewed: 01/21/2014 Elsevier Interactive Patient Education 2016 [employer] Neuropathic Pain  Neuropathic pain is pain caused by damage to the nerves that are responsible for certain sensations in your body (sensory nerves). The pain can be caused by damage to:  The sensory nerves that send signals to your spinal cord and brain (peripheral nervous system).  The sensory nerves in your brain or spinal cord (central nervous system). Neuropathic pain can make you more sensitive to pain. What would be a minor sensation for most people may feel very painful if you have  neuropathic pain. This is usually a long-term condition that can be difficult to treat. The type of pain can differ from person to person. It may start suddenly (acute), or it may develop slowly and last for a long time (chronic). Neuropathic pain may come and go as damaged nerves heal or may stay at the same level for years. It often causes emotional distress, loss of sleep, and a lower quality of life.  CAUSES  The most common cause of damage to a sensory nerve is diabetes. Many other diseases and conditions can also cause neuropathic pain. Causes of neuropathic pain can be  classified as:  Toxic. Many drugs and chemicals can cause toxic damage. The most common cause of toxic neuropathic pain is damage from drug treatment for cancer (chemotherapy).  Metabolic. This type of pain can happen when a disease causes imbalances that damage nerves. Diabetes is the most common of these diseases. Vitamin B deficiency caused by long-term alcohol abuse is another common cause.  Traumatic. Any injury that cuts, crushes, or stretches a nerve can cause damage and pain. A common example is feeling pain after losing an arm or leg (phantom limb pain).  Compression-related. If a sensory nerve gets trapped or compressed for a long period of time, the blood supply to the nerve can be cut off.  Vascular. Many blood vessel diseases can cause neuropathic pain by decreasing blood supply and oxygen to nerves.  Autoimmune. This type of pain results from diseases in which the body's defense system mistakenly attacks sensory nerves. Examples of autoimmune diseases that can cause neuropathic pain include lupus and multiple sclerosis.  Infectious. Many types of viral infections can damage sensory nerves and cause pain. Shingles infection is a common cause of this type of pain.  Inherited. Neuropathic pain can be a symptom of many diseases that are passed down through families (genetic). SIGNS AND SYMPTOMS  The main symptom is pain. Neuropathic pain is often described as:  Burning.  Shock-like.  Stinging.  Hot or cold.  Itching. DIAGNOSIS  No single test can diagnose neuropathic pain. Your health care provider will do a physical exam and ask you about your pain. You may use a pain scale to describe how bad your pain is. You may also have tests to see if you have a high sensitivity to pain and to help find the cause and location of any sensory nerve damage. These tests may include:  Imaging studies, such as:  X-rays.  CT scan.  MRI. Nerve conduction studies to test how well nerve signals travel  through your sensory nerves (electrodiagnostic testing).  Stimulating your sensory nerves through electrodes on your skin and measuring the response in your spinal cord and brain (somatosensory evoked potentials). TREATMENT  Treatment for neuropathic pain may change over time. You may need to try different treatment options or a combination of treatments. Some options include:  Over-the-counter pain relievers.  Prescription medicines. Some medicines used to treat other conditions may also help neuropathic pain. These include medicines to:  Control seizures (anticonvulsants).  Relieve depression (antidepressants). Prescription-strength pain relievers (narcotics). These are usually used when other pain relievers do not help.  Transcutaneous nerve stimulation (TENS). This uses electrical currents to block painful nerve signals. The treatment is painless.  Topical and local anesthetics. These are medicines that numb the nerves. They can be injected as a nerve block or applied to the skin.  Alternative treatments, such as:  Acupuncture.  Meditation.  Massage.  Physical therapy.  Pain  management programs.  Counseling. HOME CARE INSTRUCTIONS  Learn as much as you can about your condition.  Take medicines only as directed by your health care provider.  Work closely with all your health care providers to find what works best for you.  Have a good support system at home.  Consider joining a chronic pain support group. SEEK MEDICAL CARE IF:  Your pain treatments are not helping.  You are having side effects from your medicines.  You are struggling with fatigue, mood changes, depression, or anxiety. This information is not intended to replace advice given to you by your health care provider. Make sure you discuss any questions you have with your health care provider.  Document Released: 03/19/2004 Document Revised: 07/13/2014 Document Reviewed: 11/30/2013  Elsevier Interactive Patient Education  [employer].

## 2015-12-02 LAB — CULTURE, GROUP A STREP (THRC)

## 2016-01-06 ENCOUNTER — Ambulatory Visit (HOSPITAL_COMMUNITY)
Admission: EM | Admit: 2016-01-06 | Discharge: 2016-01-06 | Disposition: A | Discharge status: Home / Self Care | Payer: Medicaid Other | Attending: Family Medicine | Admitting: Family Medicine

## 2016-01-06 ENCOUNTER — Encounter (HOSPITAL_COMMUNITY): Payer: Self-pay | Admitting: Emergency Medicine

## 2016-01-06 DIAGNOSIS — M940 Chondrocostal junction syndrome [Tietze]: Secondary | ICD-10-CM | POA: Diagnosis not present

## 2016-01-06 MED ORDER — DICLOFENAC SODIUM 75 MG PO TBEC: 75.0000 mg | DELAYED_RELEASE_TABLET | Freq: Two times a day (BID) | ORAL | Status: DC

## 2016-01-06 NOTE — ED Provider Notes (Signed)
CSN: [id number]     Arrival date & time 01/06/16  1322 History   First MD Initiated Contact with Patient 01/06/16 1356     Chief Complaint  Patient presents with  . Chest Pain  . Dizziness   (Consider location/radiation/quality/duration/timing/severity/associated sxs/prior Treatment) HPI History obtained from patient: Location:  Chest Context/Duration: Onset yesterday worse today while lifting boxes at work  Severity: 7  Quality: Aching, sharp Timing:           Constant Home Treatment: No home treatment Associated symptoms:  Pain with deep inspiration Family History: Hypertension    Past Medical History  Diagnosis Date  . Diabetes mellitus   . Hypertension   . Asthma   . Bipolar affective disorder ([place])   . Fibromyalgia   . Hx of tonsillectomy   . Thyroid disease   . Bronchitis   . Obesity    Past Surgical History  Procedure Laterality Date  . Cesarean section    . Vagina surgery      fistula  . Arm surgey    . Tubal ligation     No family history on file. Social History  Substance Use Topics  . Smoking status: Current Every Day Smoker -- 0.25 packs/day    Types: Cigarettes  . Smokeless tobacco: None  . Alcohol Use: No   OB History    No data available     Review of Systems  Denies: HEADACHE, NAUSEA, ABDOMINAL PAIN, CHEST PAIN, CONGESTION, DYSURIA, SHORTNESS OF BREATH  Allergies  Penicillins  Home Medications   Prior to Admission medications   Medication Sig Start Date End Date Taking? Authorizing Provider  ALBUTEROL IN Inhale into the lungs. Left at home   Yes Historical Provider, MD  benzonatate (TESSALON) 100 MG capsule Take 1 capsule (100 mg total) by mouth every 8 (eight) hours. Patient not taking: Reported on 01/06/2016 11/21/15   Samantha Tripp Dowless, PA-C  cephALEXin (KEFLEX) 500 MG capsule Take 1 capsule (500 mg total) by mouth 4 (four) times daily. Patient not taking: Reported on 01/06/2016 11/11/15   Melony Overly, MD  diclofenac (VOLTAREN) 75  MG EC tablet Take 1 tablet (75 mg total) by mouth 2 (two) times daily. 01/06/16   Konrad Felix, PA  gabapentin (NEURONTIN) 300 MG capsule Take 1 capsule (300 mg total) by mouth at bedtime. 11/29/15   Bjorn Pippin, PA-C  phenazopyridine (PYRIDIUM) 200 MG tablet Take 1 tablet (200 mg total) by mouth 3 (three) times daily. Patient not taking: Reported on 01/06/2016 11/11/15   Melony Overly, MD  predniSONE (DELTASONE) 50 MG tablet Take 1 tablet daily Patient not taking: Reported on 01/06/2016 11/21/15   Dondra Spry Dowless, PA-C   Meds Ordered and Administered this Visit  Medications - No data to display  BP 137/93 mmHg  Pulse 88  Temp(Src) 98.4 F (36.9 C) (Oral)  Resp 32  SpO2 100%  LMP 12/29/2015 No data found.   Physical Exam NURSES NOTES AND VITAL SIGNS REVIEWED. CONSTITUTIONAL: Well developed, well nourished, no acute distress HEENT: normocephalic, atraumatic EYES: Conjunctiva normal NECK:normal ROM, supple, no adenopathy PULMONARY:No respiratory distress, normal effort, chest tenderness mid sternum worse with deep inspiration. Point tender ABDOMINAL: Soft, ND, NT BS+, No CVAT MUSCULOSKELETAL: Normal ROM of all extremities,  SKIN: warm and dry without rash PSYCHIATRIC: Mood and affect, behavior are normal   ED Course  Procedures (including critical care time)  Labs Review Labs Reviewed - No data to display  Imaging Review No results  found.   Visual Acuity Review  Right Eye Distance:   Left Eye Distance:   Bilateral Distance:    Right Eye Near:   Left Eye Near:    Bilateral Near:       [id number], hot compresses, return to work note is provided  MDM   1. Acute costochondritis     Patient is reassured that there are no issues that require transfer to higher level of care at this time or additional tests. Patient is advised to continue home symptomatic treatment. Patient is advised that if there are new or worsening symptoms to attend the emergency  department, contact primary care provider, or return to UC. Instructions of care provided discharged home in stable condition.    THIS NOTE WAS GENERATED USING A VOICE RECOGNITION SOFTWARE PROGRAM. ALL REASONABLE EFFORTS  WERE MADE TO PROOFREAD THIS DOCUMENT FOR ACCURACY.  I have verbally reviewed the discharge instructions with the patient. A printed AVS was given to the patient.  All questions were answered prior to discharge.      Konrad Felix, [place] 01/06/16 1944

## 2016-01-06 NOTE — ED Notes (Signed)
Patient reports going to work this morning.  While at work got hot and coughing and now does not feel well.  Reports chest pain in upper chest.  Pain is worse with movement, worse with cough, worse with deep inspriation

## 2016-01-06 NOTE — Discharge Instructions (Signed)
Costochondritis  Costochondritis is a condition in which the tissue (cartilage) that connects your ribs with your breastbone (sternum) becomes irritated. It causes pain in the chest and rib area. It usually goes away on its own over time.  HOME CARE  · Avoid activities that wear you out.  · Do not strain your ribs. Avoid activities that use your:    Chest.    Belly.    Side muscles.  · Put ice on the area for the first 2 days after the pain starts.    Put ice in a plastic bag.    Place a towel between your skin and the bag.    Leave the ice on for 20 minutes, 2-3 times a day.  · Only take medicine as told by your doctor.  GET HELP IF:  · You have redness or puffiness (swelling) in the rib area.  · Your pain does not go away with rest or medicine.  GET HELP RIGHT AWAY IF:   · Your pain gets worse.  · You are very uncomfortable.  · You have trouble breathing.  · You cough up blood.  · You start sweating or throwing up (vomiting).  · You have a fever or lasting symptoms for more than 2-3 days.  · You have a fever and your symptoms suddenly get worse.  MAKE SURE YOU:   · Understand these instructions.  · Will watch your condition.  · Will get help right away if you are not doing well or get worse.     This information is not intended to replace advice given to you by your health care provider. Make sure you discuss any questions you have with your health care provider.     Document Released: 12/09/2007 Document Revised: 02/22/2013 Document Reviewed: 01/24/2013  Elsevier Interactive Patient Education ©2016 [employer]

## 2016-05-03 ENCOUNTER — Emergency Department (HOSPITAL_COMMUNITY)
Admission: EM | Admit: 2016-05-03 | Discharge: 2016-05-03 | Disposition: A | Discharge status: Home / Self Care | Payer: Medicaid Other | Attending: Emergency Medicine | Admitting: Emergency Medicine

## 2016-05-03 ENCOUNTER — Emergency Department (HOSPITAL_COMMUNITY): Payer: Medicaid Other

## 2016-05-03 ENCOUNTER — Encounter (HOSPITAL_COMMUNITY): Payer: Self-pay

## 2016-05-03 DIAGNOSIS — F17210 Nicotine dependence, cigarettes, uncomplicated: Secondary | ICD-10-CM | POA: Insufficient documentation

## 2016-05-03 DIAGNOSIS — I10 Essential (primary) hypertension: Secondary | ICD-10-CM | POA: Insufficient documentation

## 2016-05-03 DIAGNOSIS — R05 Cough: Secondary | ICD-10-CM | POA: Diagnosis present

## 2016-05-03 DIAGNOSIS — J40 Bronchitis, not specified as acute or chronic: Secondary | ICD-10-CM | POA: Diagnosis not present

## 2016-05-03 DIAGNOSIS — E039 Hypothyroidism, unspecified: Secondary | ICD-10-CM | POA: Diagnosis not present

## 2016-05-03 DIAGNOSIS — E119 Type 2 diabetes mellitus without complications: Secondary | ICD-10-CM | POA: Insufficient documentation

## 2016-05-03 MED ORDER — ALBUTEROL SULFATE HFA 108 (90 BASE) MCG/ACT IN AERS: 1.0000 | INHALATION_SPRAY | Freq: Four times a day (QID) | RESPIRATORY_TRACT | 0 refills | Status: DC | PRN

## 2016-05-03 MED ORDER — HYDROCOD POLST-CPM POLST ER 10-8 MG/5ML PO SUER
5.0000 mL | Freq: Once | ORAL | Status: AC
  Administered 2016-05-03: 5 mL via ORAL
  Filled 2016-05-03: qty 5

## 2016-05-03 MED ORDER — FLUCONAZOLE 200 MG PO TABS: 200.0000 mg | ORAL_TABLET | Freq: Every day | ORAL | 0 refills | Status: AC

## 2016-05-03 MED ORDER — IPRATROPIUM-ALBUTEROL 0.5-2.5 (3) MG/3ML IN SOLN
3.0000 mL | Freq: Once | RESPIRATORY_TRACT | Status: AC
  Administered 2016-05-03: 3 mL via RESPIRATORY_TRACT
  Filled 2016-05-03: qty 3

## 2016-05-03 MED ORDER — AZITHROMYCIN 250 MG PO TABS: 250.0000 mg | ORAL_TABLET | Freq: Every day | ORAL | 0 refills | Status: DC

## 2016-05-03 MED ORDER — FLUCONAZOLE 100 MG PO TABS
200.0000 mg | ORAL_TABLET | Freq: Once | ORAL | Status: AC
  Administered 2016-05-03: 200 mg via ORAL
  Filled 2016-05-03: qty 2

## 2016-05-03 MED ORDER — PREDNISONE 50 MG PO TABS: ORAL_TABLET | ORAL | 0 refills | Status: DC

## 2016-05-03 NOTE — ED Notes (Signed)
Declined W/C at D/C and was escorted to lobby by RN. 

## 2016-05-03 NOTE — ED Triage Notes (Signed)
Patient complains of 2 days of non-productive cough with no associated symptoms. Taking otc meds with no response. Using inhaler-albuterol with no relief. Alert and oriented, NAD

## 2016-05-03 NOTE — ED Provider Notes (Signed)
[facility] DEPT Provider Note   CSN: [id number] Arrival date & time: 05/03/16  1430   By signing my name below, I, Eunice Blase, attest that this documentation has been prepared under the direction and in the presence of [place], [place]. Electronically signed, Eunice Blase, ED Scribe. 05/03/16. 4:45 PM.   History   Chief Complaint Chief Complaint  Patient presents with  . Cough      Cough  Associated symptoms include chills and wheezing. Pertinent negatives include no sore throat.   HPI Comments: Ruth [name] is a 40 y.o. female with a PMHx of asthma and bronchitis who presents to the Emergency Department complaining of gradual worsening, dry cough x 3 days. The pt reports associated wheezing, pruritis of the right eye and diarrhea. She states that she tried mucinex and theraflu, but they made her "jittery". She also reports that her inhaler has expired. The pt is a smoker 4 x day x 18 years. Pt denies abdominal or back pain, sore throat, or gland swelling. Pt also reports that she is diabetic and has developed a yeast infection.  Past Medical History:  Diagnosis Date  . Asthma   . Bipolar affective disorder ([place])   . Bronchitis   . Diabetes mellitus   . Fibromyalgia   . Hx of tonsillectomy   . Hypertension   . Obesity   . Thyroid disease     Patient Active Problem List   Diagnosis Date Noted  . HYPOTHYROIDISM 06/21/2009  . DM 06/21/2009  . BIPOLAR AFFECTIVE DISORDER 06/21/2009  . 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 INADEQUATE SLEEP HYGIENE 06/21/2009  . OBSTRUCTIVE SLEEP APNEA 06/21/2009  . 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 CARPAL TUNNEL SYNDROME 06/20/2009  . MERALGIA PARESTHETICA 06/20/2009  . HYPERTENSION 06/20/2009  . FIBROMYALGIA 06/20/2009  . CEPHALGIA 06/20/2009    Past Surgical History:  Procedure Laterality Date  . arm surgey    . CESAREAN SECTION    . TUBAL LIGATION    . VAGINA SURGERY     fistula    OB  History    No data available       Home Medications    Prior to Admission medications   Medication Sig Start Date End Date Taking? Authorizing Provider  albuterol (PROVENTIL HFA;VENTOLIN HFA) 108 (90 Base) MCG/ACT inhaler Inhale 1-2 puffs into the lungs every 6 (six) hours as needed for wheezing or shortness of breath. 05/03/16   [place], NP  azithromycin (ZITHROMAX) 250 MG tablet Take 1 tablet (250 mg total) by mouth daily. Take first 2 tablets together, then 1 every day until finished. 05/03/16   Aquita Simmering Bunnie Pion, NP  benzonatate (TESSALON) 100 MG capsule Take 1 capsule (100 mg total) by mouth every 8 (eight) hours. Patient not taking: Reported on 01/06/2016 11/21/15   Samantha Tripp Dowless, PA-C  diclofenac (VOLTAREN) 75 MG EC tablet Take 1 tablet (75 mg total) by mouth 2 (two) times daily. 01/06/16   Konrad Felix, PA  fluconazole (DIFLUCAN) 200 MG tablet Take 1 tablet (200 mg total) by mouth daily. 05/03/16 05/10/16  [place], NP  gabapentin (NEURONTIN) 300 MG capsule Take 1 capsule (300 mg total) by mouth at bedtime. 11/29/15   Bjorn Pippin, PA-C  phenazopyridine (PYRIDIUM) 200 MG tablet Take 1 tablet (200 mg total) by mouth 3 (three) times daily. Patient not taking: Reported on 01/06/2016 11/11/15   Melony Overly, MD  predniSONE (DELTASONE) 50 MG tablet Take one tablet daily 05/03/16  Shion [place]in Bunnie Pion, NP    Family History No family history on file.  Social History Social History  Substance Use Topics  . Smoking status: Current Every Day Smoker    Packs/day: 0.25    Types: Cigarettes  . Smokeless tobacco: Not on file  . Alcohol use No     Allergies   Penicillins   Review of Systems Review of Systems  Constitutional: Positive for chills. Negative for fever.  HENT: Negative for congestion, facial swelling and sore throat.   Eyes: Positive for itching. Negative for pain and discharge.  Respiratory: Positive for cough and wheezing.   Gastrointestinal: Negative for  abdominal pain, nausea and vomiting.  Genitourinary: Negative for decreased urine volume, difficulty urinating, dysuria, frequency and urgency. Vaginal discharge: itching.  Musculoskeletal: Negative for back pain and neck stiffness.  Hematological: Negative for adenopathy.     Physical Exam Updated Vital Signs BP 135/82 (BP Location: Left Arm)   Pulse 80   Temp 98.2 F (36.8 C) (Oral)   Resp 16   LMP 04/14/2016 (Approximate)   SpO2 100%   Physical Exam  Constitutional: She is oriented to person, place, and time. She appears well-developed and well-nourished. No distress.  HENT:  Head: Normocephalic and atraumatic.  Right Ear: Tympanic membrane normal.  Left Ear: Tympanic membrane normal.  Mouth/Throat: Uvula is midline, oropharynx is clear and moist and mucous membranes are normal.  Eyes: EOM are normal. Pupils are equal, round, and reactive to light.  Neck: Normal range of motion.  Cardiovascular: Normal rate, regular rhythm and normal heart sounds.   Pulmonary/Chest: Effort normal. No respiratory distress. She has decreased breath sounds. She has wheezes (occasional). She has no rales.  Decreases sounds on one side  Abdominal: Soft. She exhibits no distension. There is no tenderness.  Musculoskeletal: Normal range of motion.  Neurological: She is alert and oriented to person, place, and time.  Skin: Skin is warm and dry.  Psychiatric: She has a normal mood and affect. Judgment normal.  Nursing note and vitals reviewed.    ED Treatments / Results  DIAGNOSTIC STUDIES: Oxygen Saturation is 100% on RA, normal by my interpretation.    COORDINATION OF CARE: 6:03 PM Discussed treatment plan with pt at bedside and pt agreed to plan.    Labs (all labs ordered are listed, but only abnormal results are displayed) Labs Reviewed - No data to display  Radiology No results found.  Procedures Procedures (including critical care time)  Medications Ordered in ED Medications    ipratropium-albuterol (DUONEB) 0.5-2.5 (3) MG/3ML nebulizer solution 3 mL (3 mLs Nebulization Given 05/03/16 1625)  chlorpheniramine-HYDROcodone (TUSSIONEX) 10-8 MG/5ML suspension 5 mL (5 mLs Oral Given 05/03/16 1624)  fluconazole (DIFLUCAN) tablet 200 mg (200 mg Oral Given 05/03/16 1730)     Initial Impression / Assessment and Plan / ED Course  I have reviewed the triage vital signs and the nursing notes.  Pertinent labs & imaging results that were available during my care of the patient were reviewed by me and considered in my medical decision making (see chart for details).  Clinical Course   40 y.o. female with cough and wheezing stable for d/c after improvement with treatment in the ED. Will refill her inhaler, start zithromax and give her Diflucan prior to d/c.patient to f/u with her PCP or return here for worsening symptoms. Encouraged patient to stop smoking.    Final Clinical Impressions(s) / ED Diagnoses   Final diagnoses:  Bronchitis    New Prescriptions  Discharge Medication List as of 05/03/2016  5:39 PM    START taking these medications   Details  albuterol (PROVENTIL HFA;VENTOLIN HFA) 108 (90 Base) MCG/ACT inhaler Inhale 1-2 puffs into the lungs every 6 (six) hours as needed for wheezing or shortness of breath., Starting Sun 05/03/2016, Print    azithromycin (ZITHROMAX) 250 MG tablet Take 1 tablet (250 mg total) by mouth daily. Take first 2 tablets together, then 1 every day until finished., Starting Sun 05/03/2016, Print    fluconazole (DIFLUCAN) 200 MG tablet Take 1 tablet (200 mg total) by mouth daily., Starting Sun 05/03/2016, Until Sun 05/10/2016, Print      I personally performed the services described in this documentation, which was scribed in my presence. The recorded information has been reviewed and is accurate.     [place], NP 05/05/16 1806    Leo Grosser, MD 05/06/16 [phone]

## 2016-08-07 ENCOUNTER — Emergency Department (HOSPITAL_COMMUNITY)
Admission: EM | Admit: 2016-08-07 | Discharge: 2016-08-07 | Disposition: A | Discharge status: Home / Self Care | Payer: Medicaid Other | Attending: Emergency Medicine | Admitting: Emergency Medicine

## 2016-08-07 DIAGNOSIS — Z79899 Other long term (current) drug therapy: Secondary | ICD-10-CM | POA: Diagnosis not present

## 2016-08-07 DIAGNOSIS — F17210 Nicotine dependence, cigarettes, uncomplicated: Secondary | ICD-10-CM | POA: Diagnosis not present

## 2016-08-07 DIAGNOSIS — J45909 Unspecified asthma, uncomplicated: Secondary | ICD-10-CM | POA: Insufficient documentation

## 2016-08-07 DIAGNOSIS — I10 Essential (primary) hypertension: Secondary | ICD-10-CM | POA: Diagnosis not present

## 2016-08-07 DIAGNOSIS — E119 Type 2 diabetes mellitus without complications: Secondary | ICD-10-CM | POA: Diagnosis not present

## 2016-08-07 DIAGNOSIS — N938 Other specified abnormal uterine and vaginal bleeding: Secondary | ICD-10-CM

## 2016-08-07 LAB — COMPREHENSIVE METABOLIC PANEL
ALT: 10 U/L — ABNORMAL LOW (ref 14–54)
ANION GAP: 10 (ref 5–15)
AST: 15 U/L (ref 15–41)
Albumin: 3.8 g/dL (ref 3.5–5.0)
Alkaline Phosphatase: 75 U/L (ref 38–126)
BUN: 7 mg/dL (ref 6–20)
CALCIUM: 9.2 mg/dL (ref 8.9–10.3)
CHLORIDE: 102 mmol/L (ref 101–111)
CO2: 28 mmol/L (ref 22–32)
Creatinine, Ser: 0.71 mg/dL (ref 0.44–1.00)
GFR calc non Af Amer: >60 mL/min (ref >60)
Glucose, Bld: 133 mg/dL — ABNORMAL HIGH (ref 65–99)
POTASSIUM: 4.3 mmol/L (ref 3.5–5.1)
SODIUM: 140 mmol/L (ref 135–145)
Total Bilirubin: 0.5 mg/dL (ref 0.3–1.2)
Total Protein: 7.1 g/dL (ref 6.5–8.1)

## 2016-08-07 LAB — CBC WITH DIFFERENTIAL/PLATELET
Basophils Absolute: 0 10*3/uL (ref 0.0–0.1)
Basophils Relative: 0 %
EOS ABS: 0.3 10*3/uL (ref 0.0–0.7)
EOS PCT: 5 %
HCT: 32.8 % — ABNORMAL LOW (ref 36.0–46.0)
Hemoglobin: 10.1 g/dL — ABNORMAL LOW (ref 12.0–15.0)
LYMPHS ABS: 1.4 10*3/uL (ref 0.7–4.0)
Lymphocytes Relative: 26 %
MCH: 23.7 pg — ABNORMAL LOW (ref 26.0–34.0)
MCHC: 30.8 g/dL (ref 30.0–36.0)
MCV: 77 fL — ABNORMAL LOW (ref 78.0–100.0)
MONOS PCT: 4 %
Monocytes Absolute: 0.3 10*3/uL (ref 0.1–1.0)
Neutro Abs: 3.7 10*3/uL (ref 1.7–7.7)
Neutrophils Relative %: 65 %
PLATELETS: 394 10*3/uL (ref 150–400)
RBC: 4.26 MIL/uL (ref 3.87–5.11)
RDW: 16.4 % — AB (ref 11.5–15.5)
WBC: 5.6 10*3/uL (ref 4.0–10.5)

## 2016-08-07 LAB — I-STAT BETA HCG BLOOD, ED (MC, WL, AP ONLY)

## 2016-08-07 MED ORDER — MEDROXYPROGESTERONE ACETATE 10 MG PO TABS: 10.0000 mg | ORAL_TABLET | Freq: Every day | ORAL | 0 refills | Status: DC

## 2016-08-07 MED ORDER — OXYCODONE-ACETAMINOPHEN 5-325 MG PO TABS
1.0000 | ORAL_TABLET | Freq: Once | ORAL | Status: AC
  Administered 2016-08-07: 1 via ORAL
  Filled 2016-08-07: qty 1

## 2016-08-07 NOTE — ED Provider Notes (Signed)
[facility] DEPT Provider Note   CSN: [id number] Arrival date & time: 08/07/16  1048     History   Chief Complaint Chief Complaint  Patient presents with  . Vaginal Bleeding    HPI Ruth [name] is a 41 y.o. female.  HPI Patient presents to the emergency department his complaints of increasing lower abdominal menstrual cramps with vaginal bleeding which is heavier than normal.  She reports no syncopal or lightheadedness.  She's had a long-standing history of heavy vaginal bleeding.  She's never had this worked up by gynecologist.  She presents today because of worsening lower abdominal cramping.  She is undergone bilateral tubal ligations per the patient.  She denies nausea vomiting.  No diarrhea.  No fevers or chills.  No back pain.   Past Medical History:  Diagnosis Date  . Asthma   . Bipolar affective disorder ([place])   . Bronchitis   . Diabetes mellitus   . Fibromyalgia   . Hx of tonsillectomy   . Hypertension   . Obesity   . Thyroid disease     Patient Active Problem List   Diagnosis Date Noted  . HYPOTHYROIDISM 06/21/2009  . DM 06/21/2009  . BIPOLAR AFFECTIVE DISORDER 06/21/2009  . 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 INADEQUATE SLEEP HYGIENE 06/21/2009  . OBSTRUCTIVE SLEEP APNEA 06/21/2009  . 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 CARPAL TUNNEL SYNDROME 06/20/2009  . MERALGIA PARESTHETICA 06/20/2009  . HYPERTENSION 06/20/2009  . FIBROMYALGIA 06/20/2009  . CEPHALGIA 06/20/2009    Past Surgical History:  Procedure Laterality Date  . arm surgey    . CESAREAN SECTION    . TUBAL LIGATION    . VAGINA SURGERY     fistula    OB History    No data available       Home Medications    Prior to Admission medications   Medication Sig Start Date End Date Taking? Authorizing Provider  ibuprofen (ADVIL,MOTRIN) 200 MG tablet Take 400 mg by mouth every 6 (six) hours as needed for headache or moderate pain.   Yes Historical Provider,  MD  naproxen sodium (ANAPROX) 220 MG tablet Take 440 mg by mouth 2 (two) times daily as needed (for pain).   Yes Historical Provider, MD  PRESCRIPTION MEDICATION Apply 1 application topically as needed (for pain). Compound topical ointment for pain   Yes Historical Provider, MD  albuterol (PROVENTIL HFA;VENTOLIN HFA) 108 (90 Base) MCG/ACT inhaler Inhale 1-2 puffs into the lungs every 6 (six) hours as needed for wheezing or shortness of breath. 05/03/16   [place] Bunnie Pion, NP  medroxyPROGESTERone (PROVERA) 10 MG tablet Take 1 tablet (10 mg total) by mouth daily. 08/07/16   Jola Schmidt, MD    Family History No family history on file.  Social History Social History  Substance Use Topics  . Smoking status: Current Every Day Smoker    Packs/day: 0.25    Types: Cigarettes  . Smokeless tobacco: Not on file  . Alcohol use No     Allergies   Penicillins   Review of Systems Review of Systems  All other systems reviewed and are negative.    Physical Exam Updated Vital Signs BP 130/79   Pulse 88   Temp 97.4 F (36.3 C) (Oral)   Resp 18   Ht 5\' 6"  (1.676 m)   Wt (!) 307 lb (139.3 kg)   LMP 08/07/2016 (Exact Date)   SpO2 100%   BMI 49.55 kg/m   Physical Exam  Constitutional: She  is oriented to person, place, and time. She appears well-developed and well-nourished.  HENT:  Head: Normocephalic.  Eyes: EOM are normal.  Neck: Normal range of motion.  Pulmonary/Chest: Effort normal.  Abdominal: She exhibits no distension.  Musculoskeletal: Normal range of motion.  Neurological: She is alert and oriented to person, place, and time.  Psychiatric: She has a normal mood and affect.  Nursing note and vitals reviewed.    ED Treatments / Results  Labs (all labs ordered are listed, but only abnormal results are displayed) Labs Reviewed  CBC WITH DIFFERENTIAL/PLATELET - Abnormal; Notable for the following:       Result Value   Hemoglobin 10.1 (*)    HCT 32.8 (*)    MCV 77.0 (*)     MCH 23.7 (*)    RDW 16.4 (*)    All other components within normal limits  COMPREHENSIVE METABOLIC PANEL - Abnormal; Notable for the following:    Glucose, Bld 133 (*)    ALT 10 (*)    All other components within normal limits  I-STAT BETA HCG BLOOD, ED ([facility], [facility], [facility] ONLY)    EKG  EKG Interpretation None       Radiology No results found.  Procedures Procedures (including critical care time)  Medications Ordered in ED Medications  oxyCODONE-acetaminophen (PERCOCET/ROXICET) 5-325 MG per tablet 1 tablet (1 tablet Oral Given 08/07/16 1138)     Initial Impression / Assessment and Plan / ED Course  I have reviewed the triage vital signs and the nursing notes.  Pertinent labs & imaging results that were available during my care of the patient were reviewed by me and considered in my medical decision making (see chart for details).     Long-standing history of dysfunctional uterine bleeding.  Pregnancy test is negative.  Hemoglobin is stable.  Vital signs are normal.  Patient be placed on a 10 day course of Provera and understands importance of close gynecologic follow-up.  She will need outpatient gynecologic workup for ongoing heavy menstrual cycles.  She's been referred to the women's outpatient clinic.  She understands to return to the ER for new or worsening symptoms.  No indication for additional testing or imaging today.  Final Clinical Impressions(s) / ED Diagnoses   Final diagnoses:  Dysfunctional uterine bleeding    New Prescriptions New Prescriptions   MEDROXYPROGESTERONE (PROVERA) 10 MG TABLET    Take 1 tablet (10 mg total) by mouth daily.     Jola Schmidt, MD 08/07/16 1309

## 2016-08-07 NOTE — ED Triage Notes (Signed)
Pt reports onset of menstrual cycle yesterday, with bil abd cramps worse than ever before, pt reports large clots, pt reports changing tampon hourly, pt reports no pain relief with advil, pt A&O x4

## 2016-08-07 NOTE — ED Notes (Signed)
ED Provider at bedside. 

## 2016-08-20 ENCOUNTER — Emergency Department (HOSPITAL_COMMUNITY)
Admission: EM | Admit: 2016-08-20 | Discharge: 2016-08-20 | Disposition: A | Discharge status: Home / Self Care | Payer: Medicaid Other | Attending: Emergency Medicine | Admitting: Emergency Medicine

## 2016-08-20 ENCOUNTER — Emergency Department (HOSPITAL_COMMUNITY): Payer: Medicaid Other

## 2016-08-20 ENCOUNTER — Encounter (HOSPITAL_COMMUNITY): Payer: Self-pay

## 2016-08-20 DIAGNOSIS — J45909 Unspecified asthma, uncomplicated: Secondary | ICD-10-CM | POA: Diagnosis not present

## 2016-08-20 DIAGNOSIS — I10 Essential (primary) hypertension: Secondary | ICD-10-CM | POA: Diagnosis not present

## 2016-08-20 DIAGNOSIS — E119 Type 2 diabetes mellitus without complications: Secondary | ICD-10-CM | POA: Diagnosis not present

## 2016-08-20 DIAGNOSIS — E039 Hypothyroidism, unspecified: Secondary | ICD-10-CM | POA: Insufficient documentation

## 2016-08-20 DIAGNOSIS — M792 Neuralgia and neuritis, unspecified: Secondary | ICD-10-CM | POA: Diagnosis not present

## 2016-08-20 DIAGNOSIS — F17210 Nicotine dependence, cigarettes, uncomplicated: Secondary | ICD-10-CM | POA: Diagnosis not present

## 2016-08-20 DIAGNOSIS — R52 Pain, unspecified: Secondary | ICD-10-CM

## 2016-08-20 DIAGNOSIS — M79601 Pain in right arm: Secondary | ICD-10-CM | POA: Diagnosis present

## 2016-08-20 DIAGNOSIS — G5621 Lesion of ulnar nerve, right upper limb: Secondary | ICD-10-CM

## 2016-08-20 LAB — I-STAT TROPONIN, ED: Troponin i, poc: 0 ng/mL (ref 0.00–0.08)

## 2016-08-20 MED ORDER — PREDNISONE 10 MG (21) PO TBPK: 10.0000 mg | ORAL_TABLET | Freq: Every day | ORAL | 0 refills | Status: DC

## 2016-08-20 NOTE — Discharge Instructions (Signed)
Medications: Prednisone  Treatment: Take prednisone as prescribed. Make sure to finish all this medication. You can take ibuprofen ( up to 800 mg every 8 hours) for ear pain and inflammation. You can alternate with Tylenol as prescribed over-the-counter. Use a pillow in your elbow to help you from sleeping with complete flexion of your elbow.  Follow-up: Please follow-up with Dr. Fredna Dow by calling his office tomorrow for further evaluation and treatment. Please return to the emergency department if you develop any new or worsening symptoms.

## 2016-08-20 NOTE — ED Provider Notes (Signed)
[facility] DEPT Provider Note   CSN: [id number] Arrival date & time: 08/20/16  1919  By signing my name below, I, Ruth [name], attest that this documentation has been prepared under the direction and in the presence of Ruth Biles, MD . Electronically Signed: Neta [name], ED Scribe. 08/20/2016. 9:08 PM.    History   Chief Complaint Chief Complaint  Patient presents with  . Hand Pain   The history is provided by the patient. No language interpreter was used.   HPI Comments:  Ruth [name] is a 41 y.o. female with PMHx of DM and PSHx of carpal tunnel surgery who presents to the Emergency Department complaining of constant right arm pain since 1200 today. Pt states that her arm feels "locked up," and when she stretches out her right arm the pain worsens significantly, and reports significant pain to her right fingers. Pt states that the pain radiates throughout her arm and right hand and to her right neck and shoulder. Pt describes the pain as sharp, and states that the pain is worse in her upper arm. Pt complains of associated numbness in her hand radiating up the dorsal side of her forearm to her elbow. Pt states that a few weeks ago she had similar pain but this was resolved by ibuprofen. Pt had carpal tunnel surgery on her right hand in 2010. She reports that today she stretched her arm and that provided mild, temporary relief, but the pain resumed shortly after. Pt denies other associated symptoms.   Past Medical History:  Diagnosis Date  . Asthma   . Bipolar affective disorder ([place])   . Bronchitis   . Diabetes mellitus   . Fibromyalgia   . Hx of tonsillectomy   . Hypertension   . Obesity   . Thyroid disease     Patient Active Problem List   Diagnosis Date Noted  . HYPOTHYROIDISM 06/21/2009  . DM 06/21/2009  . BIPOLAR AFFECTIVE DISORDER 06/21/2009  . 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 INADEQUATE SLEEP HYGIENE 06/21/2009    . OBSTRUCTIVE SLEEP APNEA 06/21/2009  . 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 CARPAL TUNNEL SYNDROME 06/20/2009  . MERALGIA PARESTHETICA 06/20/2009  . HYPERTENSION 06/20/2009  . FIBROMYALGIA 06/20/2009  . CEPHALGIA 06/20/2009    Past Surgical History:  Procedure Laterality Date  . arm surgey    . CESAREAN SECTION    . TUBAL LIGATION    . VAGINA SURGERY     fistula    OB History    No data available       Home Medications    Prior to Admission medications   Medication Sig Start Date End Date Taking? Authorizing Provider  albuterol (PROVENTIL HFA;VENTOLIN HFA) 108 (90 Base) MCG/ACT inhaler Inhale 1-2 puffs into the lungs every 6 (six) hours as needed for wheezing or shortness of breath. 05/03/16   Hope Bunnie Pion, NP  ibuprofen (ADVIL,MOTRIN) 200 MG tablet Take 400 mg by mouth every 6 (six) hours as needed for headache or moderate pain.    Historical Provider, MD  medroxyPROGESTERone (PROVERA) 10 MG tablet Take 1 tablet (10 mg total) by mouth daily. 08/07/16   Jola Schmidt, MD  naproxen sodium (ANAPROX) 220 MG tablet Take 440 mg by mouth 2 (two) times daily as needed (for pain).    Historical Provider, MD  PRESCRIPTION MEDICATION Apply 1 application topically as needed (for pain). Compound topical ointment for pain    Historical Provider, MD    Family History History reviewed. No  pertinent family history.  Social History Social History  Substance Use Topics  . Smoking status: Current Every Day Smoker    Packs/day: 0.25    Types: Cigarettes  . Smokeless tobacco: Never Used  . Alcohol use No     Allergies   Penicillins   Review of Systems Review of Systems  Musculoskeletal: Positive for myalgias.  Neurological: Positive for numbness.   ROS 10 Systems reviewed and are negative for acute change except as noted in the HPI.      Physical Exam Updated Vital Signs BP 132/82 (BP Location: Left Arm)   Pulse 103   Temp 98.9 F (37.2 C) (Oral)   Resp 18   Ht 5\' 6"   (1.676 m)   Wt (!) 307 lb (139.3 kg)   LMP 08/07/2016 (Exact Date)   SpO2 100%   BMI 49.55 kg/m   Physical Exam  Constitutional: She appears well-developed and well-nourished. No distress.  HENT:  Head: Normocephalic and atraumatic.  Eyes: Conjunctivae are normal.  Neck: Neck supple.  Cardiovascular: Normal rate and regular rhythm.   No murmur heard. Pulmonary/Chest: Effort normal and breath sounds normal. No respiratory distress.  Abdominal: Soft. There is no tenderness.  Musculoskeletal: She exhibits no edema.  No midline c-spine tenderness. Right upper extremity between shoulder and elbow, pt is able to discriminate between sharp and dull sensation. At the level of forearm pt is able to discriminate between sharp and dull on volar and palmar aspect of forearm, however on dorsal aspect she has no sensation. At the level of the right hand, sensation intact at the level of hand on dorsal aspect. No sensation on digit 3, 4 and 5 and associated metacarpal region. Sensation intact over index finger and thumb and associated metacarpal region on the dorsal side. Same sensory distribution is consistent on palmar aspect of right hand. Pt has her digits number 3, 4 and 5 involuntarily flexed at MCP joint. Pt able to extend and flex elbow, and able to pronate and supinate right forearm with discomfort. Compartments of right upper extremity soft.  2+ radial and ulnar pulse. No wrist drop.  Neurological: She is alert.  Skin: Skin is warm and dry.  Psychiatric: She has a normal mood and affect.  Nursing note and vitals reviewed.    ED Treatments / Results  DIAGNOSTIC STUDIES:  Oxygen Saturation is 100% on RA, normal by my interpretation.    COORDINATION OF CARE:  9:26 PM   I personally performed the services described in this documentation, which was scribed in my presence. The recorded information has been reviewed and is accurate.    Labs (all labs ordered are listed, but only abnormal  results are displayed) Labs Reviewed - No data to display  EKG  EKG Interpretation None       Radiology Dg Shoulder Right  Result Date: 08/20/2016 CLINICAL DATA:  Subacute onset of right shoulder pain. Initial encounter. EXAM: RIGHT SHOULDER - 2+ VIEW COMPARISON:  None. FINDINGS: There is no evidence of fracture or dislocation. The right humeral head is seated within the glenoid fossa. The acromioclavicular joint is unremarkable in appearance. No significant soft tissue abnormalities are seen. The visualized portions of the right lung are clear. IMPRESSION: No evidence of fracture or dislocation. Electronically Signed   By: Garald Balding M.D.   On: 08/20/2016 20:13   Dg Hand Complete Right  Result Date: 08/20/2016 CLINICAL DATA:  Acute onset of right hand pain. Cannot move hand. Initial encounter. EXAM: RIGHT HAND -  COMPLETE 3+ VIEW COMPARISON:  None. FINDINGS: There is no evidence of fracture or dislocation. The joint spaces are preserved. The carpal rows are intact, and demonstrate normal alignment. A tiny osseous fragment along the volar aspect of the wrist may be degenerative in nature. The soft tissues are unremarkable in appearance. IMPRESSION: No evidence of acute fracture or dislocation. Electronically Signed   By: Garald Balding M.D.   On: 08/20/2016 20:12    Procedures Procedures (including critical care time)  Medications Ordered in ED Medications - No data to display   Initial Impression / Assessment and Plan / ED Course  I have reviewed the triage vital signs and the nursing notes.  Pertinent labs & imaging results that were available during my care of the patient were reviewed by me and considered in my medical decision making (see chart for details).     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 She is noted to have the finger of benediction / claw hand. She has sensory loss over the ulnar distribution of the forearm and hand.  We will call  hand surgeon for best management. She has had surgical decompression of carpal tunnel / median nerve in the past.  Final Clinical Impressions(s) / ED Diagnoses   Final diagnoses:  Ulnar neuritis, right    New Prescriptions New Prescriptions   No medications on file  I personally performed the services described in this documentation, which was scribed in my presence. The recorded information has been reviewed and is accurate.      Ruth Biles, MD 08/22/16 [phone]

## 2016-08-20 NOTE — ED Notes (Signed)
ED Provider at bedside. 

## 2016-08-20 NOTE — ED Triage Notes (Addendum)
Pt endorses right hand pain and states "I can't move my hand or arm" Pt able to move right index finger only. Pt also endorses that pain radiates from right hand up to the right shoulder. Pt states this happened a couple weeks and took motrin and it resolved then came back today. Neuro sensation normal. Pt unable to raise right arm due to pain. Sx began at 1200 today. Denies injury. VSS.

## 2016-08-21 NOTE — ED Provider Notes (Signed)
Sign out from Dr. Kathrynn Humble  Patient with acute onset right arm  Pain and numbness. Patient with numbness to her distribution. Involuntary flexions digits 3-5. Tenderness with extension of the elbow. Some pain radiating up to the shoulder. Disposition pending hand surgery consult. I discussed patient case with Dr. Fredna Dow who recommended prednisone taper and follow up to his office. Dr. Fredna Dow also recommended patient have pillow in antecubital fossa at night to keep positioning from making patient's symptoms worse.Dr. Fredna Dow also advised ruling out an ACS, although this would be a very atypical presentation. EKG shows NSR. Troponin negative. Considering patient's symptoms consistent since noon, only one troponin indicated. Patient given strict return precautions. Patient advised to call Dr. Levell July office in the morning. Patient understands and agrees with plan. Patient vitals stable and discharged in satisfactory condition.   Frederica Kuster, PA-C 08/21/16 [phone]

## 2016-09-25 ENCOUNTER — Encounter: Payer: Medicaid Other | Admitting: Obstetrics & Gynecology

## 2016-09-28 ENCOUNTER — Encounter: Payer: Self-pay | Admitting: Family Medicine

## 2016-09-28 ENCOUNTER — Ambulatory Visit (INDEPENDENT_AMBULATORY_CARE_PROVIDER_SITE_OTHER): Payer: Medicaid Other | Admitting: Family Medicine

## 2016-09-28 VITALS — BP 143/90 | HR 86 | Wt 311.0 lb

## 2016-09-28 DIAGNOSIS — N939 Abnormal uterine and vaginal bleeding, unspecified: Secondary | ICD-10-CM

## 2016-09-28 DIAGNOSIS — Z1231 Encounter for screening mammogram for malignant neoplasm of breast: Secondary | ICD-10-CM

## 2016-09-28 DIAGNOSIS — Z01419 Encounter for gynecological examination (general) (routine) without abnormal findings: Secondary | ICD-10-CM | POA: Insufficient documentation

## 2016-09-28 MED ORDER — NORETHIN ACE-ETH ESTRAD-FE 1-20 MG-MCG(24) PO TABS: 1.0000 | ORAL_TABLET | Freq: Every day | ORAL | 11 refills | Status: DC

## 2016-09-28 NOTE — Progress Notes (Signed)
   Subjective:    Patient ID: Ruth [name], female    DOB: 11/18/1975, 41 y.o.   MRN: [id number]  HPI  Patient seen for heavy bleeding. Started having heavier bleeding 2-3 years ago. Bleeds for 7 days, changing pad 3 times in an hour during first 2-3 days. 28 days between menses. + Dysmenorrhea.  Was diagnosed with hypothyroidism at one point and had bx of thyroid, which was normal (per patient). No longer on treatment.  I have reviewed the patients past medical, family, and social history.  I have reviewed the patient's medication list and allergies.   Review of Systems  Constitutional: Negative for chills, fatigue and fever.  Neurological: Negative for dizziness, light-headedness and headaches.       Objective:   Physical Exam  Constitutional: She appears well-developed and well-nourished.  HENT:  Head: Normocephalic and atraumatic.  Cardiovascular: Normal rate, regular rhythm and normal heart sounds.   Pulmonary/Chest: Effort normal and breath sounds normal.  Abdominal: Soft. She exhibits no distension. There is no tenderness. There is no rebound and no guarding.  Genitourinary: There is no rash, tenderness, lesion or injury on the right labia. There is no rash, tenderness, lesion or injury on the left labia. Uterus is enlarged. Uterus is not deviated, not fixed and not tender. Cervix exhibits no motion tenderness, no discharge and no friability. No erythema, tenderness or bleeding in the vagina. No foreign body in the vagina. No signs of injury around the vagina. No vaginal discharge found.  Skin: Skin is warm and dry. No rash noted. No erythema. No pallor.  Psychiatric: She has a normal mood and affect. Her behavior is normal. Judgment and thought content normal.      Assessment & Plan:  1. Abnormal uterine bleeding (AUB) Start on OCPs - lower dose estrogen (lo estrin). Will screen for hypothyroidism and diabetes. Will also get [place] to evaluate for fibroids and endometrial  thickening. - TSH - CBC - Hemoglobin A1c - US Pelvis Complete; Future - US Transvaginal Non-OB; Future  2. Visit for screening mammogram - MM Digital Screening; Future

## 2016-09-28 NOTE — Progress Notes (Signed)
Mammogram scheduled for April 19th @ 0800.  Pt notified.

## 2016-09-29 LAB — CBC
HEMOGLOBIN: 10.5 g/dL — AB (ref 11.1–15.9)
Hematocrit: 31.5 % — ABNORMAL LOW (ref 34.0–46.6)
MCH: 24.4 pg — ABNORMAL LOW (ref 26.6–33.0)
MCHC: 33.3 g/dL (ref 31.5–35.7)
MCV: 73 fL — AB (ref 79–97)
PLATELETS: 474 10*3/uL — AB (ref 150–379)
RBC: 4.3 x10E6/uL (ref 3.77–5.28)
RDW: 17.6 % — ABNORMAL HIGH (ref 12.3–15.4)
WBC: 5.6 10*3/uL (ref 3.4–10.8)

## 2016-09-29 LAB — HEMOGLOBIN A1C
Est. average glucose Bld gHb Est-mCnc: 171 mg/dL
Hgb A1c MFr Bld: 7.6 % — ABNORMAL HIGH (ref 4.8–5.6)

## 2016-09-29 LAB — TSH: TSH: 2.63 u[IU]/mL (ref 0.450–4.500)

## 2016-10-02 ENCOUNTER — Ambulatory Visit (HOSPITAL_COMMUNITY)
Admission: RE | Admit: 2016-10-02 | Discharge: 2016-10-02 | Disposition: A | Discharge status: Home / Self Care | Payer: Medicaid Other | Source: Ambulatory Visit | Attending: Family Medicine | Admitting: Family Medicine

## 2016-10-02 DIAGNOSIS — N939 Abnormal uterine and vaginal bleeding, unspecified: Secondary | ICD-10-CM

## 2016-10-02 DIAGNOSIS — N83202 Unspecified ovarian cyst, left side: Secondary | ICD-10-CM | POA: Diagnosis not present

## 2016-10-02 DIAGNOSIS — D259 Leiomyoma of uterus, unspecified: Secondary | ICD-10-CM | POA: Insufficient documentation

## 2016-10-20 ENCOUNTER — Encounter: Payer: Self-pay | Admitting: Family Medicine

## 2016-10-20 DIAGNOSIS — N83209 Unspecified ovarian cyst, unspecified side: Secondary | ICD-10-CM | POA: Insufficient documentation

## 2016-10-22 ENCOUNTER — Ambulatory Visit
Admission: RE | Admit: 2016-10-22 | Discharge: 2016-10-22 | Disposition: A | Discharge status: Home / Self Care | Payer: Medicaid Other | Source: Ambulatory Visit | Attending: Family Medicine | Admitting: Family Medicine

## 2016-10-22 DIAGNOSIS — Z1231 Encounter for screening mammogram for malignant neoplasm of breast: Secondary | ICD-10-CM

## 2016-10-26 ENCOUNTER — Other Ambulatory Visit: Payer: Self-pay | Admitting: Family Medicine

## 2016-10-26 DIAGNOSIS — R928 Other abnormal and inconclusive findings on diagnostic imaging of breast: Secondary | ICD-10-CM

## 2016-10-30 ENCOUNTER — Ambulatory Visit
Admission: RE | Admit: 2016-10-30 | Discharge: 2016-10-30 | Disposition: A | Discharge status: Home / Self Care | Payer: Medicaid Other | Source: Ambulatory Visit | Attending: Family Medicine | Admitting: Family Medicine

## 2016-10-30 DIAGNOSIS — R928 Other abnormal and inconclusive findings on diagnostic imaging of breast: Secondary | ICD-10-CM

## 2016-11-02 ENCOUNTER — Encounter: Payer: Self-pay | Admitting: Family Medicine

## 2016-11-02 DIAGNOSIS — R928 Other abnormal and inconclusive findings on diagnostic imaging of breast: Secondary | ICD-10-CM | POA: Insufficient documentation

## 2016-11-08 ENCOUNTER — Encounter (HOSPITAL_COMMUNITY): Payer: Self-pay

## 2016-11-08 ENCOUNTER — Emergency Department (HOSPITAL_COMMUNITY)
Admission: EM | Admit: 2016-11-08 | Discharge: 2016-11-08 | Disposition: A | Discharge status: Home / Self Care | Payer: Medicaid Other | Attending: Emergency Medicine | Admitting: Emergency Medicine

## 2016-11-08 DIAGNOSIS — R05 Cough: Secondary | ICD-10-CM | POA: Insufficient documentation

## 2016-11-08 DIAGNOSIS — E039 Hypothyroidism, unspecified: Secondary | ICD-10-CM | POA: Diagnosis not present

## 2016-11-08 DIAGNOSIS — R059 Cough, unspecified: Secondary | ICD-10-CM

## 2016-11-08 DIAGNOSIS — I10 Essential (primary) hypertension: Secondary | ICD-10-CM | POA: Diagnosis not present

## 2016-11-08 DIAGNOSIS — F17210 Nicotine dependence, cigarettes, uncomplicated: Secondary | ICD-10-CM | POA: Insufficient documentation

## 2016-11-08 DIAGNOSIS — E119 Type 2 diabetes mellitus without complications: Secondary | ICD-10-CM | POA: Insufficient documentation

## 2016-11-08 DIAGNOSIS — Z79899 Other long term (current) drug therapy: Secondary | ICD-10-CM | POA: Diagnosis not present

## 2016-11-08 DIAGNOSIS — J45909 Unspecified asthma, uncomplicated: Secondary | ICD-10-CM | POA: Insufficient documentation

## 2016-11-08 MED ORDER — FLUTICASONE PROPIONATE 50 MCG/ACT NA SUSP: 2.0000 | Freq: Every day | NASAL | 0 refills | Status: DC

## 2016-11-08 MED ORDER — CETIRIZINE HCL 5 MG PO TABS: 5.0000 mg | ORAL_TABLET | Freq: Every day | ORAL | 0 refills | Status: DC

## 2016-11-08 NOTE — ED Provider Notes (Signed)
[facility] DEPT Provider Note   CSN: [id number] Arrival date & time: 11/08/16  1133   By signing my name below, I, Ruth [name], attest that this documentation has been prepared under the direction and in the presence of Ruth [name], [place]. Electronically Signed: Mayer [name], Scribe. 11/08/16. 1:05 PM.  History   Chief Complaint Chief Complaint  Patient presents with  . Cough    The history is provided by the patient. No language interpreter was used.   HPI Comments: Ruth [name] is a 41 y.o. female with a h/o asthma who presents to the Emergency Department complaining of a gradually-worsening dry cough since two days. She states 2 days ago, she took a nebulizer treatment that improved over the day, but it worsened after last night when she states she had an asthma attack. She has associated dry mouth, and chills. She denies congestion, rhinorrhea, sore throat, nausea, fever, and  abdominal pain. Her vaccinations are UTD.  She has no PMHx of CHF. She has a recent diagnosis of DM but denies taking medication. She has a PMHx of HTN but denies taking medication.    Past Medical History:  Diagnosis Date  . Asthma   . Bipolar affective disorder ([place])   . Bronchitis   . Diabetes mellitus   . Fibromyalgia   . Hx of tonsillectomy   . Hypertension   . Obesity   . Thyroid disease     Patient Active Problem List   Diagnosis Date Noted  . Abnormal mammogram of both breasts 11/02/2016  . Ovarian cyst 10/20/2016  . Well woman exam with routine gynecological exam 09/28/2016  . HYPOTHYROIDISM 06/21/2009  . DM 06/21/2009  . BIPOLAR AFFECTIVE DISORDER 06/21/2009  . 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 INADEQUATE SLEEP HYGIENE 06/21/2009  . OBSTRUCTIVE SLEEP APNEA 06/21/2009  . 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 CARPAL TUNNEL SYNDROME 06/20/2009  . MERALGIA PARESTHETICA 06/20/2009  . HYPERTENSION 06/20/2009  . FIBROMYALGIA 06/20/2009  .  CEPHALGIA 06/20/2009    Past Surgical History:  Procedure Laterality Date  . arm surgey    . CESAREAN SECTION    . TUBAL LIGATION    . VAGINA SURGERY     fistula    OB History    No data available       Home Medications    Prior to Admission medications   Medication Sig Start Date End Date Taking? Authorizing Provider  albuterol (PROVENTIL HFA;VENTOLIN HFA) 108 (90 Base) MCG/ACT inhaler Inhale 1-2 puffs into the lungs every 6 (six) hours as needed for wheezing or shortness of breath. 05/03/16   Ashley Murrain, NP  cetirizine (ZYRTEC) 5 MG tablet Take 1 tablet (5 mg total) by mouth daily. 11/08/16   Lorin Glass, PA-C  fluticasone [facility]) 50 MCG/ACT nasal spray Place 2 sprays into both nostrils daily. 11/08/16   Lorin Glass, PA-C  ibuprofen (ADVIL,MOTRIN) 200 MG tablet Take 400 mg by mouth every 6 (six) hours as needed for headache or moderate pain.    [provider]  naproxen sodium (ANAPROX) 220 MG tablet Take 440 mg by mouth 2 (two) times daily as needed (for pain).    [provider]  Norethindrone Acetate-Ethinyl Estrad-FE (LOESTRIN 24 FE) 1-20 MG-MCG(24) tablet Take 1 tablet by mouth daily. 09/28/16   Truett Mainland, DO  PRESCRIPTION MEDICATION Apply 1 application topically as needed (for pain). Compound topical ointment for pain    [provider]    Family History Family History  Problem Relation Age of Onset  . Breast cancer Neg Hx     Social History Social History  Substance Use Topics  . Smoking status: Current Every Day Smoker    Packs/day: 0.25    Types: Cigarettes  . Smokeless tobacco: Never Used  . Alcohol use No     Allergies   Penicillins   Review of Systems Review of Systems  Constitutional: Positive for chills. Negative for diaphoresis and fever.  HENT: Negative for congestion, facial swelling, postnasal drip, rhinorrhea, sinus pressure and sore throat.   Respiratory: Positive for cough. Negative for  chest tightness and shortness of breath.   Gastrointestinal: Negative for abdominal pain and nausea.  Neurological: Negative for speech difficulty, weakness, numbness and headaches.     Physical Exam Updated Vital Signs BP 139/87 (BP Location: Right Arm)   Pulse 90   Temp 98.7 F (37.1 C) (Oral)   Resp 16   SpO2 98%   Physical Exam  Constitutional: She appears well-developed and well-nourished.  HENT:  Head: Normocephalic and atraumatic.  Nose: Nose normal.  Mouth/Throat: Uvula is midline, oropharynx is clear and moist and mucous membranes are normal. No posterior oropharyngeal edema or posterior oropharyngeal erythema. No tonsillar exudate.  Cardiovascular: Normal rate.   Pulmonary/Chest: Effort normal and breath sounds normal. No accessory muscle usage. No respiratory distress. She has no decreased breath sounds. She has no wheezes. She has no rhonchi. She has no rales.  Lymphadenopathy:    She has no cervical adenopathy.  Neurological: She is alert.  Skin: Skin is warm and dry.  Psychiatric: She has a normal mood and affect.  Nursing note and vitals reviewed.  Patient was not coughing during exam or while in the room.   ED Treatments / Results  DIAGNOSTIC STUDIES: Oxygen Saturation is 100% on RA, normal by my interpretation.    COORDINATION OF CARE: 1:04 PM Discussed treatment plan with pt at bedside and pt agreed to plan.   Labs (all labs ordered are listed, but only abnormal results are displayed) Labs Reviewed - No data to display  EKG  EKG Interpretation None       Radiology No results found.  Procedures Procedures (including critical care time)  Medications Ordered in ED Medications - No data to display   Initial Impression / Assessment and Plan / ED Course  I have reviewed the triage vital signs and the nursing notes.  Pertinent labs & imaging results that were available during my care of the patient were reviewed by me and considered in my  medical decision making (see chart for details).    No signs of respiratory distress. Lung exam CTAB with no wheezes.  Patient reports she has sufficient nebulizer supplies at home.  Requests RX for zyrtec and Flonase for allergies.  I think her symptoms are from untreated allergy symptoms which may be causing her asthma symptoms to worsen.   Pt has been instructed to continue using prescribed medications and to speak with PCP about today's exacerbation. Patient was given the option for CXR but declined imaging.     At this time there does not appear to be any evidence of an acute emergency medical condition and the patient appears stable for discharge with appropriate outpatient follow up.Diagnosis was discussed with patient who verbalizes understanding and is agreeable to discharge.     Final Clinical Impressions(s) / ED Diagnoses   Final diagnoses:  Cough    New Prescriptions Discharge Medication List as of 11/08/2016  1:13  PM    START taking these medications   Details  cetirizine (ZYRTEC) 5 MG tablet Take 1 tablet (5 mg total) by mouth daily., Starting Sun 11/08/2016, Print    fluticasone (FLONASE) 50 MCG/ACT nasal spray Place 2 sprays into both nostrils daily., Starting Sun 11/08/2016, Print      I personally performed the services described in this documentation, which was scribed in my presence. The recorded information has been reviewed and is accurate.     Lorin Glass, PA-C 11/08/16 1735    Daleen Bo, MD 11/11/16 [phone]

## 2016-11-08 NOTE — ED Notes (Signed)
Pt states she has had a cough for a few days has used her nebulizer and it helped some but she still feels like she cannot get a deep breath

## 2016-11-08 NOTE — ED Triage Notes (Signed)
Patient complains of cough and congestion x 2 days. Has been using inhaler with some relief. NAD. Speaking complete sentences on assessment, hx of bronchitis

## 2016-12-17 ENCOUNTER — Ambulatory Visit (HOSPITAL_COMMUNITY)
Admission: EM | Admit: 2016-12-17 | Discharge: 2016-12-17 | Disposition: A | Discharge status: Home / Self Care | Payer: Medicaid Other | Attending: Internal Medicine | Admitting: Internal Medicine

## 2016-12-17 ENCOUNTER — Encounter (HOSPITAL_COMMUNITY): Payer: Self-pay | Admitting: *Deleted

## 2016-12-17 DIAGNOSIS — G44209 Tension-type headache, unspecified, not intractable: Secondary | ICD-10-CM | POA: Diagnosis not present

## 2016-12-17 MED ORDER — KETOROLAC TROMETHAMINE 30 MG/ML IJ SOLN
INTRAMUSCULAR | Status: AC
  Filled 2016-12-17: qty 1

## 2016-12-17 MED ORDER — ONDANSETRON 4 MG PO TBDP
4.0000 mg | ORAL_TABLET | Freq: Once | ORAL | Status: AC
  Administered 2016-12-17: 4 mg via ORAL

## 2016-12-17 MED ORDER — ONDANSETRON 4 MG PO TBDP
ORAL_TABLET | ORAL | Status: AC
  Filled 2016-12-17: qty 1

## 2016-12-17 MED ORDER — KETOROLAC TROMETHAMINE 30 MG/ML IJ SOLN
30.0000 mg | Freq: Once | INTRAMUSCULAR | Status: AC
  Administered 2016-12-17: 30 mg via INTRAMUSCULAR

## 2016-12-17 MED ORDER — METHYLPREDNISOLONE 4 MG PO TBPK: ORAL_TABLET | ORAL | 0 refills | Status: DC

## 2016-12-17 MED ORDER — DEXAMETHASONE SODIUM PHOSPHATE 10 MG/ML IJ SOLN: 10.0000 mg | Freq: Once | INTRAMUSCULAR | Status: DC

## 2016-12-17 NOTE — ED Triage Notes (Signed)
Pt   Reports     Symptoms     Of   Headache      Sinus     Pressure        Bilateral  Ear  Pain     Pt  Reports     Symptoms  Started  Yesterday          Pt   Reports    Pain  On  Sides  Of  Head

## 2016-12-17 NOTE — ED Provider Notes (Signed)
CSN: [id number]     Arrival date & time 12/17/16  1629 History   First MD Initiated Contact with Patient 12/17/16 1657     Chief Complaint  Patient presents with  . Headache   (Consider location/radiation/quality/duration/timing/severity/associated sxs/prior Treatment) Patient c/o headache and sinus pressure.  She c/o frontal headache and occipital headache.   The history is provided by the patient.  Headache  Pain location:  Frontal and occipital Quality:  Dull Severity currently:  5/10 Severity at highest:  5/10 Onset quality:  Sudden Timing:  Constant Chronicity:  New Relieved by:  Nothing Worsened by:  Nothing   Past Medical History:  Diagnosis Date  . Asthma   . Bipolar affective disorder ([place])   . Bronchitis   . Diabetes mellitus   . Fibromyalgia   . Hx of tonsillectomy   . Hypertension   . Obesity   . Thyroid disease    Past Surgical History:  Procedure Laterality Date  . arm surgey    . CESAREAN SECTION    . TUBAL LIGATION    . VAGINA SURGERY     fistula   Family History  Problem Relation Age of Onset  . Breast cancer Neg Hx    Social History  Substance Use Topics  . Smoking status: Current Every Day Smoker    Packs/day: 0.25    Types: Cigarettes  . Smokeless tobacco: Never Used  . Alcohol use No   OB History    No data available     Review of Systems  Constitutional: Negative.   HENT: Negative.   Eyes: Negative.   Respiratory: Negative.   Cardiovascular: Negative.   Gastrointestinal: Negative.   Endocrine: Negative.   Genitourinary: Negative.   Musculoskeletal: Negative.   Allergic/Immunologic: Negative.   Neurological: Positive for headaches.  Hematological: Negative.   Psychiatric/Behavioral: Negative.     Allergies  Penicillins  Home Medications   Prior to Admission medications   Medication Sig Start Date End Date Taking? Authorizing Provider  albuterol (PROVENTIL HFA;VENTOLIN HFA) 108 (90 Base) MCG/ACT inhaler Inhale 1-2  puffs into the lungs every 6 (six) hours as needed for wheezing or shortness of breath. 05/03/16   Ashley Murrain, NP  cetirizine (ZYRTEC) 5 MG tablet Take 1 tablet (5 mg total) by mouth daily. 11/08/16   Lorin Glass, PA-C  fluticasone [facility]) 50 MCG/ACT nasal spray Place 2 sprays into both nostrils daily. 11/08/16   Lorin Glass, PA-C  ibuprofen (ADVIL,MOTRIN) 200 MG tablet Take 400 mg by mouth every 6 (six) hours as needed for headache or moderate pain.    [provider]  methylPREDNISolone (MEDROL DOSEPAK) 4 MG TBPK tablet Take 6-5-4-3-2-1 po qd 12/17/16   Lysbeth Penner, FNP  naproxen sodium (ANAPROX) 220 MG tablet Take 440 mg by mouth 2 (two) times daily as needed (for pain).    [provider]  Norethindrone Acetate-Ethinyl Estrad-FE (LOESTRIN 24 FE) 1-20 MG-MCG(24) tablet Take 1 tablet by mouth daily. 09/28/16   Truett Mainland, DO  PRESCRIPTION MEDICATION Apply 1 application topically as needed (for pain). Compound topical ointment for pain    [provider]   Meds Ordered and Administered this Visit   Medications  ketorolac (TORADOL) 30 MG/ML injection 30 mg (30 mg Intramuscular Given 12/17/16 1713)  ondansetron (ZOFRAN-ODT) disintegrating tablet 4 mg (4 mg Oral Given 12/17/16 1713)    BP 128/74 (BP Location: Right Arm)   Pulse 78   Temp 98.6 F (37 C) (Oral)  Resp 18   LMP 12/17/2016   SpO2 100%  No data found.   Physical Exam  Constitutional: She is oriented to person, place, and time. She appears well-developed and well-nourished.  HENT:  Head: Normocephalic and atraumatic.  Right Ear: External ear normal.  Left Ear: External ear normal.  Mouth/Throat: Oropharynx is clear and moist.  Eyes: Conjunctivae and EOM are normal. Pupils are equal, round, and reactive to light.  Neck: Normal range of motion. Neck supple.  Cardiovascular: Normal rate, regular rhythm and normal heart sounds.   Pulmonary/Chest: Effort normal and breath  sounds normal.  Neurological: She is alert and oriented to person, place, and time.  Nursing note and vitals reviewed.   Urgent Care Course     Procedures (including critical care time)  Labs Review Labs Reviewed - No data to display  Imaging Review No results found.   Visual Acuity Review  Right Eye Distance:   Left Eye Distance:   Bilateral Distance:    Right Eye Near:   Left Eye Near:    Bilateral Near:         MDM   1. Tension headache    Toradol 30 mg IM Zofran ODT 4mg  po now  Medrol dose pack as directed 4mg  #21 Fioricet one to two po q 6 hours prn #20      Lysbeth Penner, FNP 12/17/16 1829

## 2017-01-11 ENCOUNTER — Ambulatory Visit: Payer: Medicaid Other | Admitting: Family Medicine

## 2017-01-30 ENCOUNTER — Ambulatory Visit (HOSPITAL_COMMUNITY)
Admission: EM | Admit: 2017-01-30 | Discharge: 2017-01-30 | Disposition: A | Discharge status: Home / Self Care | Payer: Medicaid Other | Attending: Family Medicine | Admitting: Family Medicine

## 2017-01-30 ENCOUNTER — Encounter (HOSPITAL_COMMUNITY): Payer: Self-pay | Admitting: Emergency Medicine

## 2017-01-30 DIAGNOSIS — S1086XA Insect bite of other specified part of neck, initial encounter: Secondary | ICD-10-CM

## 2017-01-30 DIAGNOSIS — B3731 Acute candidiasis of vulva and vagina: Secondary | ICD-10-CM

## 2017-01-30 DIAGNOSIS — W57XXXA Bitten or stung by nonvenomous insect and other nonvenomous arthropods, initial encounter: Secondary | ICD-10-CM

## 2017-01-30 DIAGNOSIS — B373 Candidiasis of vulva and vagina: Secondary | ICD-10-CM | POA: Diagnosis not present

## 2017-01-30 MED ORDER — FLUCONAZOLE 150 MG PO TABS: 150.0000 mg | ORAL_TABLET | Freq: Every day | ORAL | 0 refills | Status: AC

## 2017-01-30 MED ORDER — MUPIROCIN CALCIUM 2 % EX CREA: 1.0000 "application " | TOPICAL_CREAM | Freq: Two times a day (BID) | CUTANEOUS | 1 refills | Status: AC

## 2017-01-30 NOTE — ED Triage Notes (Signed)
The patient presented to the [facility] with a possible insect bite to her neck that occurred 5 days ago. The patient reported using otc creams and sprays with minimal results.

## 2017-01-30 NOTE — ED Provider Notes (Signed)
CSN: [id number]     Arrival date & time 01/30/17  1211 History   First MD Initiated Contact with Patient 01/30/17 1256     Chief Complaint  Patient presents with  . Insect Bite   (Consider location/radiation/quality/duration/timing/severity/associated sxs/prior Treatment) 41 y.o. Female, states that she got bit by an insect to her right lower neck 4 days ago while outside. She felt something stung her but didn't see what it was. She have been using hydrocortisone 2.5% on the area with no improvement. She reports the site of the insect bite is sore and itchy. She denies fever.   At the end of the visit, patient requesting for diflucan x 2 doses. She reports to have a yeast vaginitis x 1 week with vaginal itchiness. She reports to get yeast infection quite frequently. She have been using OTC monistat with no improvement. She declined pelvic exam today.   BP: 118/91 at recheck.       Past Medical History:  Diagnosis Date  . Asthma   . Bipolar affective disorder ([place])   . Bronchitis   . Diabetes mellitus   . Fibromyalgia   . Hx of tonsillectomy   . Hypertension   . Obesity   . Thyroid disease    Past Surgical History:  Procedure Laterality Date  . arm surgey    . CESAREAN SECTION    . TUBAL LIGATION    . VAGINA SURGERY     fistula   Family History  Problem Relation Age of Onset  . Breast cancer Neg Hx    Social History  Substance Use Topics  . Smoking status: Current Every Day Smoker    Packs/day: 0.25    Types: Cigarettes  . Smokeless tobacco: Never Used  . Alcohol use No   OB History    No data available     Review of Systems  Constitutional:       See HPI     Allergies  Penicillins  Home Medications   Prior to Admission medications   Not on File   Meds Ordered and Administered this Visit  Medications - No data to display  BP (!) 145/101 (BP Location: Right Arm)   Pulse 76   Temp 98.1 F (36.7 C) (Oral)   Resp 20   SpO2 99%  No data  found.   Physical Exam  Constitutional: She is oriented to person, place, and time. She appears well-developed and well-nourished.  Cardiovascular: Normal rate and regular rhythm.   Pulmonary/Chest: Effort normal and breath sounds normal.  Genitourinary:  Genitourinary Comments: Declines testing and exam  Neurological: She is alert and oriented to person, place, and time.  Skin:  See picture below.  Measures 4cm x 1.8 cm.  Sore to palpate.  No warmth on palpation.  Non-blanchable.   Nursing note and vitals reviewed.   Urgent Care Course     Procedures (including critical care time)  Labs Review Labs Reviewed - No data to display  Imaging Review No results found.  MDM   1. Insect bite, initial encounter   2. Vaginal yeast infection    1) There is a concern for infection however no indication for systemic abx. Continue the hydrocortisone 2.5%. Add Bactroban cream BID to the area. Keep the area dry and clean. F/u with PCP for no improvement in 1 week  2) Diflucan 150 mg x 2 doses given per request. She declines testing and exam today.     Barry Dienes, NP 01/30/17 1315

## 2017-06-08 ENCOUNTER — Encounter (HOSPITAL_COMMUNITY): Payer: Self-pay | Admitting: Family Medicine

## 2017-06-08 ENCOUNTER — Ambulatory Visit (HOSPITAL_COMMUNITY)
Admission: EM | Admit: 2017-06-08 | Discharge: 2017-06-08 | Disposition: A | Discharge status: Home / Self Care | Payer: Medicaid Other | Attending: Physician Assistant | Admitting: Physician Assistant

## 2017-06-08 DIAGNOSIS — N644 Mastodynia: Secondary | ICD-10-CM | POA: Diagnosis not present

## 2017-06-08 MED ORDER — CLINDAMYCIN HCL 150 MG PO CAPS: 150.0000 mg | ORAL_CAPSULE | Freq: Four times a day (QID) | ORAL | 0 refills | Status: DC

## 2017-06-08 MED ORDER — NAPROXEN 500 MG PO TABS: 500.0000 mg | ORAL_TABLET | Freq: Two times a day (BID) | ORAL | 0 refills | Status: AC

## 2017-06-08 NOTE — ED Triage Notes (Signed)
Pt here for left breast pain. Tender to palpation. Started this am. Denies fever, chills.

## 2017-06-08 NOTE — Discharge Instructions (Signed)
Come back in three days if you are not getting better. Call

## 2017-06-08 NOTE — ED Provider Notes (Signed)
06/08/2017 8:17 PM   DOB: 11-11-1975 / MRN: [id number]  SUBJECTIVE:  Ruth [name] is a 41 y.o. female presenting for breast tenderness that started today.  Denies any new mass. No trauma.  The pain is described as tender and burning.  No rash.  Does have bilateral nipple piercings but studs have not been changed recently.  Most recent mammogram in the spring and findings on [place] consistent with calcifications. A six month follow up was recommended.   She is allergic to penicillins.   She  has a past medical history of Asthma, Bipolar affective disorder ([place]), Bronchitis, Diabetes mellitus, Fibromyalgia, tonsillectomy, Hypertension, Obesity, and Thyroid disease.    She  reports that she has been smoking cigarettes.  She has been smoking about 0.25 packs per day. she has never used smokeless tobacco. She reports that she uses drugs. Drug: Marijuana. She reports that she does not drink alcohol. She  reports that she currently engages in sexual activity. She reports using the following method of birth control/protection: Surgical. The patient  has a past surgical history that includes Cesarean section; Vagina surgery; arm surgey; and Tubal ligation.  Her family history is not on file.  Review of Systems  Constitutional: Negative for chills and fever.  Skin: Negative for itching and rash.  Neurological: Negative for dizziness.    OBJECTIVE:  BP (!) 143/92   Pulse 90   Temp 98.2 F (36.8 C)   Resp 18   LMP 05/19/2017   SpO2 100%   Physical Exam  Constitutional: She appears well-developed and well-nourished. No distress.  Cardiovascular: Normal rate.  Pulmonary/Chest: Effort normal and breath sounds normal.    Skin: She is not diaphoretic.    No results found for this or any previous visit (from the past 72 hour(s)).  No results found.  ASSESSMENT AND PLAN:  The encounter diagnosis was Breast pain.  Will treat with naprosyn for now.  Clinda prescribed with indications to start  on the sig.  She is pen allergic.  Follow up in about three days.    The patient is advised to call or return to clinic if she does not see an improvement in symptoms, or to seek the care of the closest emergency department if she worsens with the above plan.   Philis Fendt, MHS, PA-C 06/08/2017 8:17 PM    Tereasa Coop, PA-C 06/08/17 2018

## 2017-06-25 ENCOUNTER — Encounter (HOSPITAL_COMMUNITY): Payer: Self-pay | Admitting: Emergency Medicine

## 2017-06-25 ENCOUNTER — Emergency Department (HOSPITAL_COMMUNITY)
Admission: EM | Admit: 2017-06-25 | Discharge: 2017-06-25 | Disposition: A | Discharge status: Home / Self Care | Payer: Medicaid Other | Attending: Emergency Medicine | Admitting: Emergency Medicine

## 2017-06-25 ENCOUNTER — Emergency Department (HOSPITAL_COMMUNITY): Payer: Medicaid Other

## 2017-06-25 ENCOUNTER — Other Ambulatory Visit: Payer: Self-pay

## 2017-06-25 DIAGNOSIS — E039 Hypothyroidism, unspecified: Secondary | ICD-10-CM | POA: Insufficient documentation

## 2017-06-25 DIAGNOSIS — J40 Bronchitis, not specified as acute or chronic: Secondary | ICD-10-CM | POA: Diagnosis not present

## 2017-06-25 DIAGNOSIS — I10 Essential (primary) hypertension: Secondary | ICD-10-CM | POA: Diagnosis not present

## 2017-06-25 DIAGNOSIS — R05 Cough: Secondary | ICD-10-CM | POA: Diagnosis present

## 2017-06-25 DIAGNOSIS — E119 Type 2 diabetes mellitus without complications: Secondary | ICD-10-CM | POA: Diagnosis not present

## 2017-06-25 DIAGNOSIS — Z79899 Other long term (current) drug therapy: Secondary | ICD-10-CM | POA: Insufficient documentation

## 2017-06-25 DIAGNOSIS — F17210 Nicotine dependence, cigarettes, uncomplicated: Secondary | ICD-10-CM | POA: Insufficient documentation

## 2017-06-25 DIAGNOSIS — R2243 Localized swelling, mass and lump, lower limb, bilateral: Secondary | ICD-10-CM | POA: Insufficient documentation

## 2017-06-25 LAB — BASIC METABOLIC PANEL
ANION GAP: 8 (ref 5–15)
BUN: 9 mg/dL (ref 6–20)
CHLORIDE: 104 mmol/L (ref 101–111)
CO2: 27 mmol/L (ref 22–32)
Calcium: 9.3 mg/dL (ref 8.9–10.3)
Creatinine, Ser: 0.74 mg/dL (ref 0.44–1.00)
GFR calc Af Amer: >60 mL/min (ref >60)
GLUCOSE: 165 mg/dL — AB (ref 65–99)
POTASSIUM: 3.8 mmol/L (ref 3.5–5.1)
Sodium: 139 mmol/L (ref 135–145)

## 2017-06-25 LAB — CBC WITH DIFFERENTIAL/PLATELET
BASOS ABS: 0 10*3/uL (ref 0.0–0.1)
Basophils Relative: 0 %
Eosinophils Absolute: 0.2 10*3/uL (ref 0.0–0.7)
Eosinophils Relative: 3 %
HEMATOCRIT: 31.7 % — AB (ref 36.0–46.0)
HEMOGLOBIN: 10 g/dL — AB (ref 12.0–15.0)
LYMPHS PCT: 30 %
Lymphs Abs: 1.7 10*3/uL (ref 0.7–4.0)
MCH: 24.6 pg — ABNORMAL LOW (ref 26.0–34.0)
MCHC: 31.5 g/dL (ref 30.0–36.0)
MCV: 78.1 fL (ref 78.0–100.0)
Monocytes Absolute: 0.2 10*3/uL (ref 0.1–1.0)
Monocytes Relative: 3 %
NEUTROS ABS: 3.8 10*3/uL (ref 1.7–7.7)
Neutrophils Relative %: 64 %
PLATELETS: 435 10*3/uL — AB (ref 150–400)
RBC: 4.06 MIL/uL (ref 3.87–5.11)
RDW: 15.9 % — ABNORMAL HIGH (ref 11.5–15.5)
WBC: 5.8 10*3/uL (ref 4.0–10.5)

## 2017-06-25 LAB — URINALYSIS, ROUTINE W REFLEX MICROSCOPIC
BILIRUBIN URINE: NEGATIVE
Glucose, UA: NEGATIVE mg/dL
Hgb urine dipstick: NEGATIVE
Ketones, ur: NEGATIVE mg/dL
Leukocytes, UA: NEGATIVE
NITRITE: NEGATIVE
Protein, ur: NEGATIVE mg/dL
SPECIFIC GRAVITY, URINE: 1.021 (ref 1.005–1.030)
pH: 5 (ref 5.0–8.0)

## 2017-06-25 LAB — I-STAT BETA HCG BLOOD, ED (MC, WL, AP ONLY): I-stat hCG, quantitative: <5 m[IU]/mL (ref <5)

## 2017-06-25 LAB — BRAIN NATRIURETIC PEPTIDE: B NATRIURETIC PEPTIDE 5: 21.4 pg/mL (ref 0.0–100.0)

## 2017-06-25 MED ORDER — DOXYCYCLINE HYCLATE 100 MG PO CAPS: 100.0000 mg | ORAL_CAPSULE | Freq: Two times a day (BID) | ORAL | 0 refills | Status: DC

## 2017-06-25 MED ORDER — BENZONATATE 100 MG PO CAPS: 100.0000 mg | ORAL_CAPSULE | Freq: Three times a day (TID) | ORAL | 0 refills | Status: DC

## 2017-06-25 NOTE — ED Provider Notes (Signed)
[facility] EMERGENCY DEPARTMENT Provider Note   CSN: [id number] Arrival date & time: 06/25/17  1931     History   Chief Complaint Chief Complaint  Patient presents with  . Cough    Nasal Congestion   . Leg Pain    HPI Ruth [name] is a 41 y.o. female.  The history is provided by the patient and medical records. No language interpreter was used.  Cough  Associated symptoms include myalgias. Pertinent negatives include no chest pain, no chills and no sore throat.  Leg Pain     Ruth [name] is a 41 y.o. female  with a PMH of HTN, DM, bipolar disorder, fibromyalgia, thyroid disorder who presents to the Emergency Department complaining of dry cough x 2 days. Associated symptoms include nasal congestion. She also reports swelling and pain to bilateral feet. No alleviating or aggravating factors noted.  No medications taken prior to arrival for symptoms.  Her feet will intermittently swell up, but this is the most severe swelling that she has had.  Denies calf pain, recent travel /surgeries/mobilizations.  Not on OCPs.  No fever or chills.  No chest pain or shortness of breath.  Past Medical History:  Diagnosis Date  . Asthma   . Bipolar affective disorder ([place])   . Bronchitis   . Diabetes mellitus   . Fibromyalgia   . Hx of tonsillectomy   . Hypertension   . Obesity   . Thyroid disease     Patient Active Problem List   Diagnosis Date Noted  . Abnormal mammogram of both breasts 11/02/2016  . Ovarian cyst 10/20/2016  . Well woman exam with routine gynecological exam 09/28/2016  . HYPOTHYROIDISM 06/21/2009  . DM 06/21/2009  . BIPOLAR AFFECTIVE DISORDER 06/21/2009  . 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 INADEQUATE SLEEP HYGIENE 06/21/2009  . OBSTRUCTIVE SLEEP APNEA 06/21/2009  . 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 CARPAL TUNNEL SYNDROME 06/20/2009  . MERALGIA PARESTHETICA 06/20/2009  . HYPERTENSION 06/20/2009  .  FIBROMYALGIA 06/20/2009  . CEPHALGIA 06/20/2009    Past Surgical History:  Procedure Laterality Date  . arm surgey    . CESAREAN SECTION    . TUBAL LIGATION    . VAGINA SURGERY     fistula    OB History    No data available       Home Medications    Prior to Admission medications   Medication Sig Start Date End Date Taking? Authorizing Provider  benzonatate (TESSALON) 100 MG capsule Take 1 capsule (100 mg total) by mouth every 8 (eight) hours. 06/25/17   Meyer Arora, Ozella Almond, PA-C  clindamycin (CLEOCIN) 150 MG capsule Take 1 capsule (150 mg total) by mouth every 6 (six) hours. Fill only if you begin to have fever or redness about the skin on the affected breast. 06/08/17   Tereasa Coop, PA-C  doxycycline (VIBRAMYCIN) 100 MG capsule Take 1 capsule (100 mg total) by mouth 2 (two) times daily. 06/25/17   Adrieana Fennelly, Ozella Almond, PA-C    Family History Family History  Problem Relation Age of Onset  . Breast cancer Neg Hx     Social History Social History   Tobacco Use  . Smoking status: Current Every Day Smoker    Packs/day: 0.25    Types: Cigarettes  . Smokeless tobacco: Never Used  Substance Use Topics  . Alcohol use: No  . Drug use: Yes    Types: Marijuana    Comment: occasional  Allergies   Penicillins   Review of Systems Review of Systems  Constitutional: Negative for chills and fever.  HENT: Positive for congestion. Negative for sore throat.   Respiratory: Positive for cough.   Cardiovascular: Positive for leg swelling. Negative for chest pain and palpitations.  Musculoskeletal: Positive for myalgias.  All other systems reviewed and are negative.    Physical Exam Updated Vital Signs BP (!) 151/82 (BP Location: Left Wrist)   Pulse 84   Temp 98.5 F (36.9 C) (Oral)   Resp 20   SpO2 97%   Physical Exam  Constitutional: She is oriented to person, place, and time. She appears well-developed and well-nourished. No distress.  HENT:  Head:  Normocephalic and atraumatic.  OP with erythema, no exudates or tonsillar hypertrophy. + nasal congestion with mucosal edema. Focal tenderness to the right maxillary sinus.  Neck: Normal range of motion. Neck supple.  No meningeal signs.   Cardiovascular: Normal rate, regular rhythm and normal heart sounds.  Pulmonary/Chest: Effort normal.  Lungs are clear to auscultation bilaterally - no w/r/r  Abdominal: Soft. She exhibits no distension. There is no tenderness.  Musculoskeletal: Normal range of motion.  1+ edema to bilateral feet. No pitting edema of the shin. No calf tenderness. Full ROM. 2+ DP bilaterally. Sensation intact.  Neurological: She is alert and oriented to person, place, and time.  Skin: Skin is warm and dry. She is not diaphoretic.  Nursing note and vitals reviewed.    ED Treatments / Results  Labs (all labs ordered are listed, but only abnormal results are displayed) Labs Reviewed  CBC WITH DIFFERENTIAL/PLATELET - Abnormal; Notable for the following components:      Result Value   Hemoglobin 10.0 (*)    HCT 31.7 (*)    MCH 24.6 (*)    RDW 15.9 (*)    Platelets 435 (*)    All other components within normal limits  BASIC METABOLIC PANEL - Abnormal; Notable for the following components:   Glucose, Bld 165 (*)    All other components within normal limits  URINALYSIS, ROUTINE W REFLEX MICROSCOPIC - Abnormal; Notable for the following components:   APPearance HAZY (*)    All other components within normal limits  BRAIN NATRIURETIC PEPTIDE  I-STAT BETA HCG BLOOD, ED ([facility], [facility], [facility] ONLY)    EKG  EKG Interpretation None       Radiology Dg Chest 2 View  Result Date: 06/25/2017 CLINICAL DATA:  Cough and chest congestion since yesterday.  Smoker. EXAM: CHEST  2 VIEW COMPARISON:  05/03/2016. FINDINGS: The cardiac silhouette remains near the upper limit of normal in size. Clear lungs with mildly progressive diffuse peribronchial thickening and accentuation of the  interstitial markings. Normal vascularity. Minimal thoracic spine degenerative changes. IMPRESSION: Interval mild bronchitic changes. Electronically Signed   By: Claudie Revering M.D.   On: 06/25/2017 20:15    Procedures Procedures (including critical care time)  Medications Ordered in ED Medications - No data to display   Initial Impression / Assessment and Plan / ED Course  I have reviewed the triage vital signs and the nursing notes.  Pertinent labs & imaging results that were available during my care of the patient were reviewed by me and considered in my medical decision making (see chart for details).    Ruth [name] is a 41 y.o. female who presents to ED for cough, congestion and lower extremity swelling x 2 days. Afebrile, hemodynamically stable with clear lung exam.  She does  have focal right maxillary sinus tenderness on exam.  She also has mild 1+ edema to bilateral feet.  No calf pain or tenderness.  Low risk [place].  Doubt DVT.  Chest x-ray with interval mild brown changes.  No signs of volume overload.  BNP normal.  Doubt CHF. Evaluation does not show pathology that would require ongoing emergent intervention or inpatient treatment.  Discussed symptomatic home care for URI symptoms.  Given focal unilateral maxillary sinus tenderness, discussed possibility of antibiotics only if symptoms do not improve.  Would want patient to try symptomatic home care for 1 week if symptoms wait before starting antibiotic.  This was discussed with patient who understands and agrees with plan.  Antibiotic to hold and start on 12/25 if symptoms persist.  PCP follow-up if no improvement.  Reasons to return to ER discussed and all questions answered.   Final Clinical Impressions(s) / ED Diagnoses   Final diagnoses:  Bronchitis    ED Discharge Orders        Ordered    doxycycline (VIBRAMYCIN) 100 MG capsule  2 times daily     06/25/17 2135    benzonatate (TESSALON) 100 MG capsule  Every 8 hours      06/25/17 2135       Damarien Nyman, Ozella Almond, PA-C 06/25/17 2158    Mesner, Corene Cornea, MD 06/26/17 [id number]

## 2017-06-25 NOTE — Discharge Instructions (Signed)
It was my pleasure taking care of you today!   If symptoms do not improve in 3-4 days, begin taking antibiotic. Tessalon as needed for cough.  Stay hydrated. Keep legs elevated as much as possible throughout the day.   Follow up with your primary care doctor if symptoms persist.   Return to ER for new or worsening symptoms, any additional concerns.

## 2017-06-25 NOTE — ED Triage Notes (Signed)
Patient reports persistent dry cough with nasal congestion and bilateral lower legs pain , no fever or chills .

## 2017-09-07 ENCOUNTER — Encounter (HOSPITAL_COMMUNITY): Payer: Self-pay | Admitting: *Deleted

## 2017-09-07 ENCOUNTER — Inpatient Hospital Stay (HOSPITAL_COMMUNITY)
Admission: AD | Admit: 2017-09-07 | Discharge: 2017-09-07 | Disposition: A | Discharge status: Home / Self Care | Payer: Medicaid Other | Source: Ambulatory Visit | Attending: Obstetrics and Gynecology | Admitting: Obstetrics and Gynecology

## 2017-09-07 DIAGNOSIS — N939 Abnormal uterine and vaginal bleeding, unspecified: Secondary | ICD-10-CM | POA: Diagnosis present

## 2017-09-07 DIAGNOSIS — N946 Dysmenorrhea, unspecified: Secondary | ICD-10-CM | POA: Diagnosis not present

## 2017-09-07 DIAGNOSIS — F17210 Nicotine dependence, cigarettes, uncomplicated: Secondary | ICD-10-CM | POA: Insufficient documentation

## 2017-09-07 DIAGNOSIS — N83202 Unspecified ovarian cyst, left side: Secondary | ICD-10-CM | POA: Diagnosis not present

## 2017-09-07 DIAGNOSIS — E119 Type 2 diabetes mellitus without complications: Secondary | ICD-10-CM | POA: Diagnosis not present

## 2017-09-07 DIAGNOSIS — F319 Bipolar disorder, unspecified: Secondary | ICD-10-CM | POA: Diagnosis not present

## 2017-09-07 DIAGNOSIS — J45909 Unspecified asthma, uncomplicated: Secondary | ICD-10-CM | POA: Insufficient documentation

## 2017-09-07 DIAGNOSIS — N920 Excessive and frequent menstruation with regular cycle: Secondary | ICD-10-CM | POA: Diagnosis not present

## 2017-09-07 DIAGNOSIS — M797 Fibromyalgia: Secondary | ICD-10-CM | POA: Insufficient documentation

## 2017-09-07 DIAGNOSIS — E079 Disorder of thyroid, unspecified: Secondary | ICD-10-CM | POA: Diagnosis not present

## 2017-09-07 DIAGNOSIS — I10 Essential (primary) hypertension: Secondary | ICD-10-CM | POA: Insufficient documentation

## 2017-09-07 DIAGNOSIS — E669 Obesity, unspecified: Secondary | ICD-10-CM | POA: Diagnosis not present

## 2017-09-07 DIAGNOSIS — R109 Unspecified abdominal pain: Secondary | ICD-10-CM | POA: Diagnosis present

## 2017-09-07 LAB — URINALYSIS, ROUTINE W REFLEX MICROSCOPIC
BILIRUBIN URINE: NEGATIVE
GLUCOSE, UA: NEGATIVE mg/dL
KETONES UR: 5 mg/dL — AB
Leukocytes, UA: NEGATIVE
Nitrite: NEGATIVE
PH: 5 (ref 5.0–8.0)
Protein, ur: NEGATIVE mg/dL
SPECIFIC GRAVITY, URINE: 1.025 (ref 1.005–1.030)

## 2017-09-07 LAB — CBC WITH DIFFERENTIAL/PLATELET
BASOS PCT: 0 %
Basophils Absolute: 0 10*3/uL (ref 0.0–0.1)
EOS ABS: 0.2 10*3/uL (ref 0.0–0.7)
Eosinophils Relative: 3 %
HEMATOCRIT: 32.1 % — AB (ref 36.0–46.0)
Hemoglobin: 10.3 g/dL — ABNORMAL LOW (ref 12.0–15.0)
Lymphocytes Relative: 31 %
Lymphs Abs: 2 10*3/uL (ref 0.7–4.0)
MCH: 24.6 pg — ABNORMAL LOW (ref 26.0–34.0)
MCHC: 32.1 g/dL (ref 30.0–36.0)
MCV: 76.8 fL — ABNORMAL LOW (ref 78.0–100.0)
MONO ABS: 0.1 10*3/uL (ref 0.1–1.0)
MONOS PCT: 2 %
NEUTROS ABS: 4 10*3/uL (ref 1.7–7.7)
Neutrophils Relative %: 64 %
Platelets: 405 10*3/uL — ABNORMAL HIGH (ref 150–400)
RBC: 4.18 MIL/uL (ref 3.87–5.11)
RDW: 15.8 % — ABNORMAL HIGH (ref 11.5–15.5)
WBC: 6.3 10*3/uL (ref 4.0–10.5)

## 2017-09-07 LAB — POCT PREGNANCY, URINE: Preg Test, Ur: NEGATIVE

## 2017-09-07 MED ORDER — OXYCODONE-ACETAMINOPHEN 5-325 MG PO TABS: 1.0000 | ORAL_TABLET | Freq: Four times a day (QID) | ORAL | 0 refills | Status: DC | PRN

## 2017-09-07 MED ORDER — MEGESTROL ACETATE 40 MG PO TABS: ORAL_TABLET | ORAL | 3 refills | Status: DC

## 2017-09-07 MED ORDER — KETOROLAC TROMETHAMINE 60 MG/2ML IM SOLN
60.0000 mg | Freq: Once | INTRAMUSCULAR | Status: AC
  Administered 2017-09-07: 60 mg via INTRAMUSCULAR
  Filled 2017-09-07: qty 2

## 2017-09-07 MED ORDER — KETOROLAC TROMETHAMINE 10 MG PO TABS: 10.0000 mg | ORAL_TABLET | Freq: Four times a day (QID) | ORAL | 0 refills | Status: DC | PRN

## 2017-09-07 NOTE — MAU Note (Signed)
Pt refused vaginal swabs for infections.

## 2017-09-07 NOTE — Discharge Instructions (Signed)
Dysfunctional Uterine Bleeding Dysfunctional uterine bleeding is abnormal bleeding from the uterus. Dysfunctional uterine bleeding includes:  A period that comes earlier or later than usual.  A period that is lighter, heavier, or has blood clots.  Bleeding between periods.  Skipping one or more periods.  Bleeding after sexual intercourse.  Bleeding after menopause.  Follow these instructions at home: Pay attention to any changes in your symptoms. Follow these instructions to help with your condition: Eating and drinking  Eat well-balanced meals. Include foods that are high in iron, such as liver, meat, shellfish, green leafy vegetables, and eggs.  If you become constipated: ? Drink plenty of water. ? Eat fruits and vegetables that are high in water and fiber, such as spinach, carrots, raspberries, apples, and mango. Medicines  Take over-the-counter and prescription medicines only as told by your health care provider.  Do not change medicines without talking with your health care provider.  Aspirin or medicines that contain aspirin may make the bleeding worse. Do not take those medicines: ? During the week before your period. ? During your period.  If you were prescribed iron pills, take them as told by your health care provider. Iron pills help to replace iron that your body loses because of this condition. Activity  If you need to change your sanitary pad or tampon more than one time every 2 hours: ? Lie in bed with your feet raised (elevated). ? Place a cold pack on your lower abdomen. ? Rest as much as possible until the bleeding stops or slows down.  Do not try to lose weight until the bleeding has stopped and your blood iron level is back to normal. Other Instructions  For two months, write down: ? When your period starts. ? When your period ends. ? When any abnormal bleeding occurs. ? What problems you notice.  Keep all follow up visits as told by your health  care provider. This is important. Contact a health care provider if:  You get light-headed or weak.  You have nausea and vomiting.  You cannot eat or drink without vomiting.  You feel dizzy or have diarrhea while you are taking medicines.  You are taking birth control pills or hormones, and you want to change them or stop taking them. Get help right away if:  You develop a fever or chills.  You need to change your sanitary pad or tampon more than one time per hour.  Your bleeding becomes heavier, or your flow contains clots more often.  You develop pain in your abdomen.  You lose consciousness.  You develop a rash. This information is not intended to replace advice given to you by your health care provider. Make sure you discuss any questions you have with your health care provider. Document Released: 06/19/2000 Document Revised: 11/28/2015 Document Reviewed: 09/17/2014 Elsevier Interactive Patient Education  2018 [employer].   Endometrial Ablation Endometrial ablation is a procedure that destroys the thin inner layer of the lining of the uterus (endometrium). This procedure may be done:  To stop heavy periods.  To stop bleeding that is causing anemia.  To control irregular bleeding.  To treat bleeding caused by small tumors (fibroids) in the endometrium.  This procedure is often an alternative to major surgery, such as removal of the uterus and cervix (hysterectomy). As a result of this procedure:  You may not be able to have children. However, if you are premenopausal (you have not gone through menopause): ? You may still have  a small chance of getting pregnant. ? You will need to use a reliable method of birth control after the procedure to prevent pregnancy.  You may stop having a menstrual period, or you may have only a small amount of bleeding during your period. Menstruation may return several years after the procedure.  Tell a health care provider  about:  Any allergies you have.  All medicines you are taking, including vitamins, herbs, eye drops, creams, and over-the-counter medicines.  Any problems you or family members have had with the use of anesthetic medicines.  Any blood disorders you have.  Any surgeries you have had.  Any medical conditions you have. What are the risks? Generally, this is a safe procedure. However, problems may occur, including:  A hole (perforation) in the uterus or bowel.  Infection of the uterus, bladder, or vagina.  Bleeding.  Damage to other structures or organs.  An air bubble in the lung (air embolus).  Problems with pregnancy after the procedure.  Failure of the procedure.  Decreased ability to diagnose cancer in the endometrium.  What happens before the procedure?  You will have tests of your endometrium to make sure there are no pre-cancerous cells or cancer cells present.  You may have an ultrasound of the uterus.  You may be given medicines to thin the endometrium.  Ask your health care provider about: ? Changing or stopping your regular medicines. This is especially important if you take diabetes medicines or blood thinners. ? Taking medicines such as aspirin and ibuprofen. These medicines can thin your blood. Do not take these medicines before your procedure if your doctor tells you not to.  Plan to have someone take you home from the hospital or clinic. What happens during the procedure?  You will lie on an exam table with your feet and legs supported as in a pelvic exam.  To lower your risk of infection: ? Your health care team will wash or sanitize their hands and put on germ-free (sterile) gloves. ? Your genital area will be washed with soap.  An IV tube will be inserted into one of your veins.  You will be given a medicine to help you relax (sedative).  A surgical instrument with a light and camera (resectoscope) will be inserted into your vagina and moved  into your uterus. This allows your surgeon to see inside your uterus.  Endometrial tissue will be removed using one of the following methods: ? Radiofrequency. This method uses a radiofrequency-alternating electric current to remove the endometrium. ? Cryotherapy. This method uses extreme cold to freeze the endometrium. ? Heated-free liquid. This method uses a heated saltwater (saline) solution to remove the endometrium. ? Microwave. This method uses high-energy microwaves to heat up the endometrium and remove it. ? Thermal balloon. This method involves inserting a catheter with a balloon tip into the uterus. The balloon tip is filled with heated fluid to remove the endometrium. The procedure may vary among health care providers and hospitals. What happens after the procedure?  Your blood pressure, heart rate, breathing rate, and blood oxygen level will be monitored until the medicines you were given have worn off.  As tissue healing occurs, you may notice vaginal bleeding for 4-6 weeks after the procedure. You may also experience: ? Cramps. ? Thin, watery vaginal discharge that is light pink or brown in color. ? A need to urinate more frequently than usual. ? Nausea.  Do not drive for 24 hours if you were given  a sedative.  Do not have sex or insert anything into your vagina until your health care provider approves. Summary  Endometrial ablation is done to treat the many causes of heavy menstrual bleeding.  The procedure may be done only after medications have been tried to control the bleeding.  Plan to have someone take you home from the hospital or clinic. This information is not intended to replace advice given to you by your health care provider. Make sure you discuss any questions you have with your health care provider. Document Released: 05/01/2004 Document Revised: 07/09/2016 Document Reviewed: 07/09/2016 Elsevier Interactive Patient Education  2017 [employer].   In  late 2019, the [facility] will be moving to the [place]. At that time, the MAU (Maternity Admissions Unit), where you are being seen today, will no longer take care of non-pregnant patients. We strongly encourage you to find a doctor's office before that time, so that you can be seen with any GYN concerns, like vaginal discharge, urinary tract infection, etc.. in a timely manner.  In order to make an office visit more convenient, the [facility] [place] [facility] will be offering evening hours with same-day appointments, walk-in appointments and scheduled appointments available during this time.  [facility] Hours: Monday - 8am - 7:30 pm with walk-in between 4pm- 7:30 pm Tuesday - 8 am - 5 pm (starting 10/05/17 we will be open late and accepting walk-ins from 4pm - 7:30pm) Wednesday - 8 am - 5 pm (starting 01/05/18 we will be open late and accepting walk-ins from 4pm - 7:30pm) Thursday 8 am - 5 pm (starting 04/07/18 we will be open late and accepting walk-ins from 4pm - 7:30pm) Friday 8 am - 5 pm  For an appointment please call the [facility] [place] @ [facility] at [phone]  For urgent needs, [facility] is also available for management of urgent GYN complaints such as vaginal discharge or urinary tract infections.

## 2017-09-07 NOTE — MAU Provider Note (Signed)
Chief Complaint: Abdominal Pain; Pelvic Pain; and Vaginal Bleeding   First Provider Initiated Contact with Patient 09/07/17 1009     SUBJECTIVE HPI: Ruth [name] is a 42 y.o. [id number] female who presents to Maternity Admissions reporting heavy menstrual period and cramps. Has Hx of same but states this is worse than usual. No relief of cramping w/ 800 IBU. Hx 6 cm left ovarian cyst and fibroids 1 year ago. Was supposed to F/U in 1 year, but has not done so. States she does not have Gyn, but was seen at [facility] by Dr. Nehemiah Settle 09/2016. Patient's last menstrual period was 09/05/2017. Has regular cycles. Has tried Prometrium and and OCPs w/out improvement for AUB. AUB is interfering w/ quality of life.    Location: entire low abd, but R>L Quality: cramping, sharp Severity: 10/10 on pain scale Duration: 24 hours Context: Onset coincided w/ onset of menses Timing: constant Modifying factors: No relief w/ IBU Associated signs and symptoms: Pos for passing clots. Neg for fever, chills, dizziness, vaginal; discharge, N/V/D/C, urinary complaints.   She states she is in a mutually monogamous relationship w/ spouse, but has not been sexually active "in a minute". Hx BTL.    Past Medical History:  Diagnosis Date  . Asthma   . Bipolar affective disorder ([place])   . Bronchitis   . Diabetes mellitus    Type 2  . Fibromyalgia   . Hx of tonsillectomy   . Hypertension   . Obesity   . Thyroid disease    OB History  Gravida Para Term Preterm AB Living  7 5 4 1 2 5   SAB TAB Ectopic Multiple Live Births  2       4    # Outcome Date GA Lbr Len/2nd Weight Sex Delivery Anes PTL Lv  7 Term      VBAC   LIV  6 Term      CS-LTranv   LIV  5 Term      Vag-Spont     4 Term      Vag-Spont   LIV  3 Preterm      CS-LTranv   LIV  2 SAB           1 SAB              Past Surgical History:  Procedure Laterality Date  . arm surgey    . CESAREAN SECTION    . TUBAL LIGATION    . VAGINA SURGERY     fistula   Social History   Socioeconomic History  . Marital status: Married    Spouse name: Not on file  . Number of children: Not on file  . Years of education: Not on file  . Highest education level: Not on file  Social Needs  . Financial resource strain: Not on file  . Food insecurity - worry: Not on file  . Food insecurity - inability: Not on file  . Transportation needs - medical: Not on file  . Transportation needs - non-medical: Not on file  Occupational History  . Not on file  Tobacco Use  . Smoking status: Current Every Day Smoker    Packs/day: 0.25    Types: Cigarettes  . Smokeless tobacco: Never Used  Substance and Sexual Activity  . Alcohol use: No  . Drug use: Yes    Types: Marijuana    Comment: occasional  . Sexual activity: Yes    Birth control/protection: Surgical  Other Topics Concern  .  Not on file  Social History Narrative  . Not on file   Family History  Problem Relation Age of Onset  . Breast cancer Neg Hx    No current facility-administered medications on file prior to encounter.    Current Outpatient Medications on File Prior to Encounter  Medication Sig Dispense Refill  . benzonatate (TESSALON) 100 MG capsule Take 1 capsule (100 mg total) by mouth every 8 (eight) hours. 21 capsule 0  . clindamycin (CLEOCIN) 150 MG capsule Take 1 capsule (150 mg total) by mouth every 6 (six) hours. Fill only if you begin to have fever or redness about the skin on the affected breast. 28 capsule 0  . doxycycline (VIBRAMYCIN) 100 MG capsule Take 1 capsule (100 mg total) by mouth 2 (two) times daily. 10 capsule 0   Allergies  Allergen Reactions  . Penicillins Other (See Comments)    Unknown; from childhood    I have reviewed patient's Past Medical Hx, Surgical Hx, Family Hx, Social Hx, medications and allergies.   Review of Systems  Constitutional: Negative for appetite change, chills and fever.  Gastrointestinal: Positive for abdominal pain. Negative for  abdominal distention, blood in stool, constipation, diarrhea, nausea and vomiting.  Genitourinary: Positive for menstrual problem, pelvic pain and vaginal bleeding. Negative for difficulty urinating, dysuria, flank pain, frequency, hematuria, urgency and vaginal discharge.  Musculoskeletal: Negative for back pain.  Neurological: Negative for dizziness.  Hematological: Does not bruise/bleed easily.    OBJECTIVE Patient Vitals for the past 24 hrs:  BP Temp Temp src Pulse Resp SpO2 Height Weight  09/07/17 0923 (!) 148/108 97.8 F (36.6 C) Oral 88 19 99 % 5\' 6"  (1.676 m) (!) 317 lb (143.8 kg)   Constitutional: Well-developed, well-nourished female in no acute distress.  Cardiovascular: normal rate Respiratory: normal rate and effort.  GI: Abd soft, mild-TTP across entire low abd. Neg rebound tenderness or mass. Exam limited by body habitus. Pos BS x 4 MS: Extremities nontender, no edema, normal ROM Neurologic: Alert and oriented x 4.  GU: Neg CVAT.  PELVIC EXAM: NEFG, physiologic discharge, small-mod amount of blood noted, cervix closed   uterus ?enlarged, pos right adnexal tenderness and ? Mass. No left  adnexal tenderness or masses. No CMT.  LAB RESULTS Results for orders placed or performed during the hospital encounter of 09/07/17 (from the past 24 hour(s))  Urinalysis, Routine w reflex microscopic     Status: Abnormal   Collection Time: 09/07/17 10:24 AM  Result Value Ref Range   Color, Urine YELLOW YELLOW   APPearance CLEAR CLEAR   Specific Gravity, Urine 1.025 1.005 - 1.030   pH 5.0 5.0 - 8.0   Glucose, UA NEGATIVE NEGATIVE mg/dL   Hgb urine dipstick SMALL (A) NEGATIVE   Bilirubin Urine NEGATIVE NEGATIVE   Ketones, ur 5 (A) NEGATIVE mg/dL   Protein, ur NEGATIVE NEGATIVE mg/dL   Nitrite NEGATIVE NEGATIVE   Leukocytes, UA NEGATIVE NEGATIVE   RBC / HPF 0-5 0 - 5 RBC/hpf   WBC, UA 0-5 0 - 5 WBC/hpf   Bacteria, UA RARE (A) NONE SEEN   Squamous Epithelial / LPF 0-5 (A) NONE  SEEN   Mucus PRESENT   Pregnancy, urine POC     Status: None   Collection Time: 09/07/17 11:15 AM  Result Value Ref Range   Preg Test, Ur NEGATIVE NEGATIVE  CBC with Differential/Platelet     Status: Abnormal   Collection Time: 09/07/17 11:39 AM  Result Value Ref Range  WBC 6.3 4.0 - 10.5 K/uL   RBC 4.18 3.87 - 5.11 MIL/uL   Hemoglobin 10.3 (L) 12.0 - 15.0 g/dL   HCT 32.1 (L) 36.0 - 46.0 %   MCV 76.8 (L) 78.0 - 100.0 fL   MCH 24.6 (L) 26.0 - 34.0 pg   MCHC 32.1 30.0 - 36.0 g/dL   RDW 15.8 (H) 11.5 - 15.5 %   Platelets 405 (H) 150 - 400 K/uL   Neutrophils Relative % 64 %   Neutro Abs 4.0 1.7 - 7.7 K/uL   Lymphocytes Relative 31 %   Lymphs Abs 2.0 0.7 - 4.0 K/uL   Monocytes Relative 2 %   Monocytes Absolute 0.1 0.1 - 1.0 K/uL   Eosinophils Relative 3 %   Eosinophils Absolute 0.2 0.0 - 0.7 K/uL   Basophils Relative 0 %   Basophils Absolute 0.0 0.0 - 0.1 K/uL    IMAGING No results found.  MAU COURSE Orders Placed This Encounter  Procedures  . Urinalysis, Routine w reflex microscopic  . CBC with Differential/Platelet  . Pregnancy, urine POC  . Discharge patient   Meds ordered this encounter  Medications  . ketorolac (TORADOL) injection 60 mg  . megestrol (MEGACE) 40 MG tablet    Sig: Take two tablets three times daily for 3 days, then two tablets twice daily for 3 days, then one tablet twice daily    Dispense:  90 tablet    Refill:  3    Order Specific Question:   Supervising Provider    Answer:   Aletha Halim [[id number]]  . ketorolac (TORADOL) 10 MG tablet    Sig: Take 1 tablet (10 mg total) by mouth every 6 (six) hours as needed.    Dispense:  20 tablet    Refill:  0    Order Specific Question:   Supervising Provider    Answer:   Aletha Halim [id number]  . oxyCODONE-acetaminophen (PERCOCET/ROXICET) 5-325 MG tablet    Sig: Take 1-2 tablets by mouth every 6 (six) hours as needed for severe pain.    Dispense:  20 tablet    Refill:  0    Order Specific  Question:   Supervising Provider    Answer:   Aletha Halim [[id number]]    MDM - Exacerbation of chronic AUB and dysmenorrhea possibly 2/2 perimenopausal changes. Will Tx w/ Megace taper and recommended discussing ablation at F/U appt OP. OP [place] to re-eval for cysts. Low suspicion for appendicitis due to location of pain and absence of fever, leukocytosis and GI complaints. Hemodynamically stable.   ASSESSMENT 1. Menorrhagia with regular cycle   2. Dysmenorrhea   3. Cyst of left ovary     PLAN Discharge home in stable condition. Bleeding and Appendicitis precautions Toradol and Percocet for pain Megace for VB.  Follow-up [facility] [place] [facility] up in 2 week(s).   Specialty:  Obstetrics and Gynecology Why:  will call you to schedule appointment for annual gyn exam and to discuss heavy, painful menstrual bleeding.  Contact information: [facility] Follow up.   Why:  in gynecology emergencies Contact information: [address] [id number] [place] Follow up.   Specialty:  Radiology Why:  will call you to schedule Ultrasound Contact information: [address] [id number] [place]  [place] [phone]         Allergies as of 09/07/2017      Reactions   Penicillins Other (See Comments)   Unknown; from childhood      Medication List    STOP taking these medications   benzonatate 100 MG capsule Commonly known as:  TESSALON   clindamycin 150 MG capsule Commonly known as:  CLEOCIN   doxycycline 100 MG capsule Commonly known as:  VIBRAMYCIN     TAKE these medications   ketorolac 10 MG tablet Commonly known as:  TORADOL Take 1 tablet (10 mg total) by mouth every 6 (six) hours as needed.    megestrol 40 MG tablet Commonly known as:  MEGACE Take two tablets three times daily for 3 days, then two tablets twice daily for 3 days, then one tablet twice daily   oxyCODONE-acetaminophen 5-325 MG tablet Commonly known as:  PERCOCET/ROXICET Take 1-2 tablets by mouth every 6 (six) hours as needed for severe pain.        Tamala Julian, [place], [place] 09/07/2017  1:16 PM

## 2017-09-07 NOTE — MAU Note (Signed)
Pt reports really bad pelvic pain, lower abd cramping since yesterday. Started her period on Sunday night and bleeding is heavy.

## 2017-09-10 ENCOUNTER — Ambulatory Visit (HOSPITAL_COMMUNITY)
Admission: RE | Admit: 2017-09-10 | Discharge: 2017-09-10 | Disposition: A | Discharge status: Home / Self Care | Payer: Medicaid Other | Source: Ambulatory Visit | Attending: Advanced Practice Midwife | Admitting: Advanced Practice Midwife

## 2017-09-10 DIAGNOSIS — N946 Dysmenorrhea, unspecified: Secondary | ICD-10-CM

## 2017-09-10 DIAGNOSIS — R9389 Abnormal findings on diagnostic imaging of other specified body structures: Secondary | ICD-10-CM | POA: Insufficient documentation

## 2017-09-10 DIAGNOSIS — N920 Excessive and frequent menstruation with regular cycle: Secondary | ICD-10-CM | POA: Diagnosis present

## 2017-09-10 DIAGNOSIS — N83202 Unspecified ovarian cyst, left side: Secondary | ICD-10-CM

## 2017-10-04 ENCOUNTER — Other Ambulatory Visit (HOSPITAL_COMMUNITY)
Admission: RE | Admit: 2017-10-04 | Discharge: 2017-10-04 | Disposition: A | Discharge status: Home / Self Care | Payer: Medicaid Other | Source: Ambulatory Visit | Attending: Obstetrics & Gynecology | Admitting: Obstetrics & Gynecology

## 2017-10-04 ENCOUNTER — Ambulatory Visit (INDEPENDENT_AMBULATORY_CARE_PROVIDER_SITE_OTHER): Payer: Medicaid Other | Admitting: Obstetrics & Gynecology

## 2017-10-04 ENCOUNTER — Encounter: Payer: Self-pay | Admitting: *Deleted

## 2017-10-04 ENCOUNTER — Encounter: Payer: Self-pay | Admitting: Obstetrics & Gynecology

## 2017-10-04 VITALS — BP 119/77 | HR 92 | Ht 66.0 in | Wt 312.5 lb

## 2017-10-04 DIAGNOSIS — Z0001 Encounter for general adult medical examination with abnormal findings: Secondary | ICD-10-CM | POA: Insufficient documentation

## 2017-10-04 DIAGNOSIS — Z0000 Encounter for general adult medical examination without abnormal findings: Secondary | ICD-10-CM

## 2017-10-04 DIAGNOSIS — N946 Dysmenorrhea, unspecified: Secondary | ICD-10-CM | POA: Diagnosis not present

## 2017-10-04 DIAGNOSIS — Z1151 Encounter for screening for human papillomavirus (HPV): Secondary | ICD-10-CM | POA: Diagnosis not present

## 2017-10-04 DIAGNOSIS — N938 Other specified abnormal uterine and vaginal bleeding: Secondary | ICD-10-CM | POA: Insufficient documentation

## 2017-10-04 DIAGNOSIS — D500 Iron deficiency anemia secondary to blood loss (chronic): Secondary | ICD-10-CM

## 2017-10-04 LAB — POCT PREGNANCY, URINE: PREG TEST UR: NEGATIVE

## 2017-10-04 MED ORDER — MEGESTROL ACETATE 40 MG PO TABS: ORAL_TABLET | ORAL | 3 refills | Status: DC

## 2017-10-04 NOTE — Progress Notes (Signed)
Patient ID: Ruth [name], female   DOB: 1976-03-26, 42 y.o.   MRN: [id number]  Chief Complaint  Patient presents with  . Follow-up    HPI Ruth [name] is a 42 y.o. female. Married P5 (50, 48, 52, 36, 34 yo kids) here today for follow up of results results. She was seen in the MAU on 09-07-17 with heavy periods, severe dysmenorrhea. She has tried IBU with no help. She was started on megace which did help the bleeding. She has tried OCPs with no help. Her hbg was 10.3 on 09-07-17. HPI  Past Medical History:  Diagnosis Date  . Asthma   . Bipolar affective disorder ([place])   . Bronchitis   . Diabetes mellitus    Type 2  . Fibromyalgia   . Hx of tonsillectomy   . Hypertension   . Obesity   . Thyroid disease     Past Surgical History:  Procedure Laterality Date  . arm surgey    . CESAREAN SECTION    . TUBAL LIGATION    . VAGINA SURGERY     fistula    Family History  Problem Relation Age of Onset  . Breast cancer Neg Hx     Social History Social History   Tobacco Use  . Smoking status: Current Every Day Smoker    Packs/day: 0.25    Types: Cigarettes  . Smokeless tobacco: Never Used  Substance Use Topics  . Alcohol use: No  . Drug use: Yes    Types: Marijuana    Comment: occasional    Allergies  Allergen Reactions  . Penicillins Other (See Comments)    Unknown; from childhood    Current Outpatient Medications  Medication Sig Dispense Refill  . ketorolac (TORADOL) 10 MG tablet Take 1 tablet (10 mg total) by mouth every 6 (six) hours as needed. 20 tablet 0  . megestrol (MEGACE) 40 MG tablet Take two tablets three times daily for 3 days, then two tablets twice daily for 3 days, then one tablet twice daily 90 tablet 3  . oxyCODONE-acetaminophen (PERCOCET/ROXICET) 5-325 MG tablet Take 1-2 tablets by mouth every 6 (six) hours as needed for severe pain. 20 tablet 0   No current facility-administered medications for this visit.     Review of Systems Review of  Systems She has had 3 vaginal deliveries and 2 cesarean sections. She has had a BTL. Blood pressure 119/77, pulse 92, height 5\' 6"  (1.676 m), weight (!) 312 lb 8 oz (141.7 kg), last menstrual period 09/05/2017.  Physical Exam Physical Exam  Breathing, conversing, and ambulating normally Well nourished, well hydrated Black female, no apparent distress Abd- benign, morbidly obese  UPT negative, consent signed, time out done Cervix prepped with betadine and grasped with a single tooth tenaculum Uterus sounded to 9 cm Pipelle used for 2 passes with a moderate amount of tissue obtained. She tolerated the procedure well.     Data Reviewed IMPRESSION: Prior Caesarean section with anterior wall uterine scar.  Questionable small anterior wall leiomyoma versus artifact.  Heterogeneous myometrium nonspecific but could represent adenomyosis.  Nonvisualization of ovaries.   Electronically Signed   By: Lavonia Dana M.D.   On: 09/10/2017 15:24  Assessment    Dysmenorrhea, DUB, and anemia - EMBX She is not a candidate due to her history of 2 cesarean sections and scar seen on uterus     Plan    Come back for results and treatment plan  Continue megace  Rec iron  daily       Emily Filbert 10/04/2017, 2:02 PM

## 2017-10-04 NOTE — Addendum Note (Signed)
Addended by: Wendelyn Breslow L on: 10/04/2017 04:05 PM   Modules accepted: Orders

## 2017-10-05 LAB — CYTOLOGY - PAP
DIAGNOSIS: NEGATIVE
HPV (WINDOPATH): NOT DETECTED

## 2017-10-16 ENCOUNTER — Other Ambulatory Visit: Payer: Self-pay

## 2017-10-16 ENCOUNTER — Encounter (HOSPITAL_COMMUNITY): Payer: Self-pay

## 2017-10-16 ENCOUNTER — Ambulatory Visit (HOSPITAL_COMMUNITY)
Admission: EM | Admit: 2017-10-16 | Discharge: 2017-10-16 | Disposition: A | Discharge status: Home / Self Care | Payer: Medicaid Other | Attending: Family Medicine | Admitting: Family Medicine

## 2017-10-16 DIAGNOSIS — J320 Chronic maxillary sinusitis: Secondary | ICD-10-CM | POA: Diagnosis not present

## 2017-10-16 MED ORDER — AZITHROMYCIN 250 MG PO TABS: ORAL_TABLET | ORAL | 0 refills | Status: DC

## 2017-10-16 MED ORDER — NEOMYCIN-POLYMYXIN-HC 3.5-10000-1 OT SUSP: 4.0000 [drp] | Freq: Three times a day (TID) | OTIC | 0 refills | Status: DC

## 2017-10-16 NOTE — ED Provider Notes (Signed)
[place]    CSN: [id number] Arrival date & time: 10/16/17  1259     History   Chief Complaint Chief Complaint  Patient presents with  . Facial Pain    HPI Ruth [name] is a 42 y.o. female.   Patient has history of seasonal allergies.  She takes Zyrtec and Flonase.  She has not seen any purulent drainage or coughed up any she does complain of some congestion as well as pain and tenderness to touch.  HPI  Past Medical History:  Diagnosis Date  . Asthma   . Bipolar affective disorder ([place])   . Bronchitis   . Diabetes mellitus    Type 2  . Fibromyalgia   . Hx of tonsillectomy   . Hypertension   . Obesity   . Thyroid disease     Patient Active Problem List   Diagnosis Date Noted  . Abnormal mammogram of both breasts 11/02/2016  . Ovarian cyst 10/20/2016  . Well woman exam with routine gynecological exam 09/28/2016  . HYPOTHYROIDISM 06/21/2009  . DM 06/21/2009  . BIPOLAR AFFECTIVE DISORDER 06/21/2009  . 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 INADEQUATE SLEEP HYGIENE 06/21/2009  . OBSTRUCTIVE SLEEP APNEA 06/21/2009  . 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 CARPAL TUNNEL SYNDROME 06/20/2009  . MERALGIA PARESTHETICA 06/20/2009  . HYPERTENSION 06/20/2009  . FIBROMYALGIA 06/20/2009  . CEPHALGIA 06/20/2009    Past Surgical History:  Procedure Laterality Date  . arm surgey    . CESAREAN SECTION    . TUBAL LIGATION    . VAGINA SURGERY     fistula    OB History    Gravida  7   Para  5   Term  4   Preterm  1   AB  2   Living  5     SAB  2   TAB      Ectopic      Multiple      Live Births  4            Home Medications    Prior to Admission medications   Medication Sig Start Date End Date Taking? Authorizing Provider  megestrol (MEGACE) 40 MG tablet Take two tablets three times daily for 3 days, then two tablets twice daily for 3 days, then one tablet twice daily 10/04/17  Yes Dove, Myra C, MD    ketorolac (TORADOL) 10 MG tablet Take 1 tablet (10 mg total) by mouth every 6 (six) hours as needed. 09/07/17   Tamala Julian, [place], CNM  oxyCODONE-acetaminophen (PERCOCET/ROXICET) 5-325 MG tablet Take 1-2 tablets by mouth every 6 (six) hours as needed for severe pain. 09/07/17   Manya Silvas, CNM    Family History Family History  Problem Relation Age of Onset  . Healthy Mother   . Healthy Father   . Breast cancer Neg Hx     Social History Social History   Tobacco Use  . Smoking status: Current Every Day Smoker    Packs/day: 0.25    Types: Cigarettes  . Smokeless tobacco: Never Used  Substance Use Topics  . Alcohol use: No  . Drug use: Yes    Types: Marijuana    Comment: occasional     Allergies   Penicillins   Review of Systems Review of Systems  Constitutional: Negative.   HENT: Positive for congestion, ear pain, sinus pressure and sinus pain.   Respiratory: Negative.   Cardiovascular: Negative.      Physical Exam Triage Vital  Signs ED Triage Vitals  Enc Vitals Group     BP 10/16/17 1309 (!) 167/110     Pulse Rate 10/16/17 1309 (!) 104     Resp 10/16/17 1309 19     Temp 10/16/17 1309 98.2 F (36.8 C)     Temp src --      SpO2 10/16/17 1309 98 %     Weight 10/16/17 1306 (!) 350 lb (158.8 kg)     Height 10/16/17 1306 5\' 6"  (1.676 m)     Head Circumference --      Peak Flow --      Pain Score 10/16/17 1306 8     Pain Loc --      Pain Edu? --      Excl. in [place]? --    No data found.  Updated Vital Signs BP (!) 167/110   Pulse (!) 104   Temp 98.2 F (36.8 C)   Resp 19   Ht 5\' 6"  (1.676 m)   Wt (!) 350 lb (158.8 kg)   LMP 09/05/2017   SpO2 98%   BMI 56.49 kg/m   Visual Acuity Right Eye Distance:   Left Eye Distance:   Bilateral Distance:    Right Eye Near:   Left Eye Near:    Bilateral Near:     Physical Exam  Constitutional: She appears well-developed and well-nourished.  HENT:  Mouth/Throat: No oropharyngeal exudate.  Right external  ear canal is inflamed and tender with traction on the tragus Right maxillary sinus tenderness to  Cardiovascular: Normal rate.  Pulmonary/Chest: Effort normal and breath sounds normal.     UC Treatments / Results  Labs (all labs ordered are listed, but only abnormal results are displayed) Labs Reviewed - No data to display  EKG None Radiology No results found.  Procedures Procedures (including critical care time)  Medications Ordered in UC Medications - No data to display   Initial Impression / Assessment and Plan / UC Course  I have reviewed the triage vital signs and the nursing notes.  Pertinent labs & imaging results that were available during my care of the patient were reviewed by me and considered in my medical decision making (see chart for details).     Right maxillary sinusitis.  With tenderness early zoster could be a possibility and I have asked her to watch for a rash.  In the meantime will treat her infections with Augmentin 875 twice daily and Cortisporin otic for pain in her ear canal  Final Clinical Impressions(s) / UC Diagnoses   Final diagnoses:  None    ED Discharge Orders    None       Controlled Substance Prescriptions [place] Controlled Substance Registry consulted? No   Wardell Honour, MD 10/16/17 1336

## 2017-10-16 NOTE — ED Triage Notes (Signed)
Pt reports pain to the right side of her face that started yesterday. Painful to touch. States she thinks it might be her ear.

## 2017-10-20 ENCOUNTER — Ambulatory Visit (INDEPENDENT_AMBULATORY_CARE_PROVIDER_SITE_OTHER): Payer: Medicaid Other | Admitting: Obstetrics & Gynecology

## 2017-10-20 ENCOUNTER — Encounter: Payer: Self-pay | Admitting: Obstetrics & Gynecology

## 2017-10-20 VITALS — BP 133/86 | HR 87 | Wt 308.7 lb

## 2017-10-20 DIAGNOSIS — N938 Other specified abnormal uterine and vaginal bleeding: Secondary | ICD-10-CM

## 2017-10-20 NOTE — Progress Notes (Signed)
Patient ID: Ruth [name], female   DOB: 11-Dec-1975, 42 y.o.   MRN: [id number]  No chief complaint on file.   HPI Ruth [name] is a 42 y.o. female. Married P5 (11, 66, 36, 97, 40 yo kids) here today for follow up of EMBX results results. She was seen in the MAU on 09-07-17 with heavy periods, severe dysmenorrhea. She has tried IBU with no help. She was started on megace which did help the bleeding. She has tried OCPs with no help. Her hbg was 10.3 on 09-07-17.     Past Medical History:  Diagnosis Date  . Asthma   . Bipolar affective disorder ([place])   . Bronchitis   . Diabetes mellitus    Type 2  . Fibromyalgia   . Hx of tonsillectomy   . Hypertension   . Obesity   . Thyroid disease     Past Surgical History:  Procedure Laterality Date  . arm surgey    . CESAREAN SECTION    . TUBAL LIGATION    . VAGINA SURGERY     fistula    Family History  Problem Relation Age of Onset  . Healthy Mother   . Healthy Father   . Breast cancer Neg Hx     Social History Social History   Tobacco Use  . Smoking status: Current Every Day Smoker    Packs/day: 0.25    Types: Cigarettes  . Smokeless tobacco: Never Used  Substance Use Topics  . Alcohol use: No  . Drug use: Yes    Types: Marijuana    Comment: occasional    Allergies  Allergen Reactions  . Penicillins Other (See Comments)    Unknown; from childhood    Current Outpatient Medications  Medication Sig Dispense Refill  . azithromycin (ZITHROMAX Z-PAK) 250 MG tablet Take as directed 6 each 0  . ketorolac (TORADOL) 10 MG tablet Take 1 tablet (10 mg total) by mouth every 6 (six) hours as needed. 20 tablet 0  . megestrol (MEGACE) 40 MG tablet Take two tablets three times daily for 3 days, then two tablets twice daily for 3 days, then one tablet twice daily 90 tablet 3  . neomycin-polymyxin-hydrocortisone (CORTISPORIN) 3.5-10000-1 OTIC suspension Place 4 drops into the right ear 3 (three) times daily. 15 mL 0  .  oxyCODONE-acetaminophen (PERCOCET/ROXICET) 5-325 MG tablet Take 1-2 tablets by mouth every 6 (six) hours as needed for severe pain. 20 tablet 0   No current facility-administered medications for this visit.     Review of Systems Review of Systems  There were no vitals taken for this visit.  Physical Exam Physical Exam Breathing, conversing, and ambulating normally Well nourished, well hydrated morbidly obese Black female, no apparent distress Abd- benign No desenscus   Data Reviewed   Assessment    DUB, fibroids versus adenomyosis    Plan    Discussed options of Mirena (She had this for a year and says that she bled the whole time) versus hysterectomy She is NOT a candidate for the ablation due to her history of 2 prior cesarean sections. Due to her history of previous cesarean sections, I have suggested that she might be a good candidate for a RATH. She will schedule a visit with Dr. Wilber Bihari 10/20/2017, 4:19 PM

## 2017-10-21 ENCOUNTER — Encounter: Payer: Self-pay | Admitting: Obstetrics & Gynecology

## 2017-10-25 ENCOUNTER — Encounter (HOSPITAL_COMMUNITY): Payer: Self-pay

## 2017-10-25 ENCOUNTER — Emergency Department (HOSPITAL_COMMUNITY)
Admission: EM | Admit: 2017-10-25 | Discharge: 2017-10-25 | Disposition: A | Discharge status: Home / Self Care | Payer: Medicaid Other | Attending: Emergency Medicine | Admitting: Emergency Medicine

## 2017-10-25 DIAGNOSIS — E119 Type 2 diabetes mellitus without complications: Secondary | ICD-10-CM | POA: Diagnosis not present

## 2017-10-25 DIAGNOSIS — I10 Essential (primary) hypertension: Secondary | ICD-10-CM | POA: Diagnosis not present

## 2017-10-25 DIAGNOSIS — F17210 Nicotine dependence, cigarettes, uncomplicated: Secondary | ICD-10-CM | POA: Diagnosis not present

## 2017-10-25 DIAGNOSIS — J45909 Unspecified asthma, uncomplicated: Secondary | ICD-10-CM | POA: Diagnosis not present

## 2017-10-25 DIAGNOSIS — L02411 Cutaneous abscess of right axilla: Secondary | ICD-10-CM | POA: Diagnosis present

## 2017-10-25 DIAGNOSIS — Z7984 Long term (current) use of oral hypoglycemic drugs: Secondary | ICD-10-CM | POA: Diagnosis not present

## 2017-10-25 DIAGNOSIS — Z79899 Other long term (current) drug therapy: Secondary | ICD-10-CM | POA: Diagnosis not present

## 2017-10-25 LAB — CBG MONITORING, ED: Glucose-Capillary: 269 mg/dL — ABNORMAL HIGH (ref 65–99)

## 2017-10-25 MED ORDER — DOXYCYCLINE HYCLATE 100 MG PO CAPS: 100.0000 mg | ORAL_CAPSULE | Freq: Two times a day (BID) | ORAL | 0 refills | Status: DC

## 2017-10-25 MED ORDER — TRAMADOL HCL 50 MG PO TABS: 50.0000 mg | ORAL_TABLET | Freq: Four times a day (QID) | ORAL | 0 refills | Status: DC | PRN

## 2017-10-25 MED ORDER — LIDOCAINE HCL 2 % IJ SOLN
20.0000 mL | Freq: Once | INTRAMUSCULAR | Status: AC
  Administered 2017-10-25: 400 mg
  Filled 2017-10-25: qty 20

## 2017-10-25 NOTE — ED Notes (Signed)
ED Provider at bedside. 

## 2017-10-25 NOTE — ED Provider Notes (Signed)
[facility] EMERGENCY DEPARTMENT Provider Note   CSN: [id number] Arrival date & time: 10/25/17  0708     History   Chief Complaint Chief Complaint  Patient presents with  . Abscess    HPI Ruth [name] is a 42 y.o. female.  HPI Patient presents to the emergency department with an abscess to the right axilla that started 2 days ago.  The patient states that she has been using warm compresses and has seen no drainage from the area.  The patient states she has not had any fevers or chills nausea or vomiting.  The patient states that she did not take any medications for this prior to arrival.  The patient denies chest pain, shortness of breath, headache,blurred vision, neck pain, fever, cough, weakness, numbness, dizziness, anorexia, edema, abdominal pain, nausea, vomiting, diarrhea, rash, back pain, dysuria, hematemesis, bloody stool, near syncope, or syncope. Past Medical History:  Diagnosis Date  . Asthma   . Bipolar affective disorder ([place])   . Bronchitis   . Diabetes mellitus    Type 2  . Fibromyalgia   . Hx of tonsillectomy   . Hypertension   . Obesity   . Thyroid disease     Patient Active Problem List   Diagnosis Date Noted  . Abnormal mammogram of both breasts 11/02/2016  . Ovarian cyst 10/20/2016  . Well woman exam with routine gynecological exam 09/28/2016  . HYPOTHYROIDISM 06/21/2009  . DM 06/21/2009  . BIPOLAR AFFECTIVE DISORDER 06/21/2009  . 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 INADEQUATE SLEEP HYGIENE 06/21/2009  . OBSTRUCTIVE SLEEP APNEA 06/21/2009  . 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 CARPAL TUNNEL SYNDROME 06/20/2009  . MERALGIA PARESTHETICA 06/20/2009  . HYPERTENSION 06/20/2009  . FIBROMYALGIA 06/20/2009  . CEPHALGIA 06/20/2009    Past Surgical History:  Procedure Laterality Date  . arm surgey    . CESAREAN SECTION    . TUBAL LIGATION    . VAGINA SURGERY     fistula     OB History    Gravida    7   Para  5   Term  4   Preterm  1   AB  2   Living  5     SAB  2   TAB      Ectopic      Multiple      Live Births  4            Home Medications    Prior to Admission medications   Medication Sig Start Date End Date Taking? Authorizing Provider  albuterol (PROVENTIL HFA;VENTOLIN HFA) 108 (90 Base) MCG/ACT inhaler Inhale 1-2 puffs into the lungs every 6 (six) hours as needed for wheezing. 05/03/16  Yes [provider]  amLODipine (NORVASC) 5 MG tablet Take 5 mg by mouth daily. 08/01/17  Yes [provider]  BELBUCA 75 MCG FILM Take 1 Film by mouth every 12 (twelve) hours. 10/15/17  Yes [provider]  cetirizine (ZYRTEC) 10 MG tablet Take 10 mg by mouth daily.   Yes [provider]  fluticasone (FLONASE) 50 MCG/ACT nasal spray Place 2 sprays into the nose daily as needed for allergies. 11/08/16  Yes [provider]  megestrol (MEGACE) 40 MG tablet Take two tablets three times daily for 3 days, then two tablets twice daily for 3 days, then one tablet twice daily 10/04/17  Yes Dove, Myra C, MD  metFORMIN (GLUCOPHAGE-XR) 500 MG 24 hr tablet Take 500 mg by mouth  2 (two) times daily. 08/01/17  Yes [provider]  neomycin-polymyxin-hydrocortisone (CORTISPORIN) 3.5-10000-1 OTIC suspension Place 4 drops into the right ear 3 (three) times daily. 10/16/17  Yes Wardell Honour, MD  rosuvastatin (CRESTOR) 10 MG tablet Take 10 mg by mouth daily. 08/01/17  Yes [provider]  SYMBICORT 160-4.5 MCG/ACT inhaler Inhale 2 puffs into the lungs 2 (two) times daily. 10/04/17  Yes [provider]  triamterene-hydrochlorothiazide (MAXZIDE-25) 37.5-25 MG tablet Take 1 tablet by mouth daily. 08/01/17  Yes [provider]  ketorolac (TORADOL) 10 MG tablet Take 1 tablet (10 mg total) by mouth every 6 (six) hours as needed. Patient not taking: Reported on 10/20/2017 09/07/17   Tamala Julian [place], CNM  oxyCODONE-acetaminophen  (PERCOCET/ROXICET) 5-325 MG tablet Take 1-2 tablets by mouth every 6 (six) hours as needed for severe pain. Patient not taking: Reported on 10/20/2017 09/07/17   Manya Silvas, CNM    Family History Family History  Problem Relation Age of Onset  . Healthy Mother   . Healthy Father   . Breast cancer Neg Hx     Social History Social History   Tobacco Use  . Smoking status: Current Every Day Smoker    Packs/day: 0.25    Types: Cigarettes  . Smokeless tobacco: Never Used  Substance Use Topics  . Alcohol use: No  . Drug use: Yes    Types: Marijuana    Comment: occasional     Allergies   Penicillins   Review of Systems Review of Systems All other systems negative except as documented in the HPI. All pertinent positives and negatives as reviewed in the HPI.  Physical Exam Updated Vital Signs BP (!) 149/98 (BP Location: Left Arm)   Pulse 92   Temp 97.7 F (36.5 C) (Oral)   Resp 18   Ht 5\' 6"  (1.676 m)   Wt (!) 139.7 kg (308 lb)   SpO2 100%   BMI 49.71 kg/m   Physical Exam  Constitutional: She is oriented to person, place, and time. She appears well-developed and well-nourished. No distress.  HENT:  Head: Normocephalic and atraumatic.  Eyes: Pupils are equal, round, and reactive to light.  Pulmonary/Chest: Effort normal.  Neurological: She is alert and oriented to person, place, and time.  Skin: Skin is warm and dry.     Psychiatric: She has a normal mood and affect.  Nursing note and vitals reviewed.    ED Treatments / Results  Labs (all labs ordered are listed, but only abnormal results are displayed) Labs Reviewed  CBG MONITORING, ED - Abnormal; Notable for the following components:      Result Value   Glucose-Capillary 269 (*)    All other components within normal limits    EKG None  Radiology No results found.  Procedures Procedures (including critical care time)  Medications Ordered in ED Medications  lidocaine (XYLOCAINE) 2 % (with  pres) injection 400 mg (400 mg Infiltration Given 10/25/17 0930)     Initial Impression / Assessment and Plan / ED Course  I have reviewed the triage vital signs and the nursing notes.  Pertinent labs & imaging results that were available during my care of the patient were reviewed by me and considered in my medical decision making (see chart for details).     INCISION AND DRAINAGE Performed by: Resa Miner Pearlena Ow Consent: Verbal consent obtained. Risks and benefits: risks, benefits and alternatives were discussed Type: abscess  Body area: Right axilla  Anesthesia: local infiltration  Incision  was made with a scalpel.  Local anesthetic: lidocaine 2 % without epinephrine  Anesthetic total: 7 ml  Complexity: complex Blunt dissection to break up loculations  Drainage: purulent  Drainage amount: Large  Packing material: 1/4 in iodoform gauze  Patient tolerance: Patient tolerated the procedure well with no immediate complications.    Patient is advised to follow-up in 2 days with her doctor or this emergency department for recheck and packing removal.  She agrees the plan and all questions were answered. Final Clinical Impressions(s) / ED Diagnoses   Final diagnoses:  None    ED Discharge Orders    None       Dalia Heading, PA-C 10/25/17 1028    Charlesetta Shanks, MD 11/01/17 1007

## 2017-10-25 NOTE — Discharge Instructions (Addendum)
Return here or follow-up with your doctor in 2 days for recheck and packing removal.  Keep the area clean and dry.  Use warm compresses around the area

## 2017-10-25 NOTE — ED Triage Notes (Signed)
PT reports boil to right axilla x several days with no drainage despite using warm compress. Denies fever/chills.

## 2017-11-08 ENCOUNTER — Encounter (HOSPITAL_COMMUNITY): Payer: Self-pay

## 2017-11-08 ENCOUNTER — Ambulatory Visit (INDEPENDENT_AMBULATORY_CARE_PROVIDER_SITE_OTHER): Payer: Self-pay | Admitting: Obstetrics & Gynecology

## 2017-11-08 ENCOUNTER — Encounter: Payer: Self-pay | Admitting: Obstetrics & Gynecology

## 2017-11-08 DIAGNOSIS — N938 Other specified abnormal uterine and vaginal bleeding: Secondary | ICD-10-CM

## 2017-11-08 MED ORDER — MEGESTROL ACETATE 40 MG PO TABS: ORAL_TABLET | ORAL | 3 refills | Status: DC

## 2017-11-08 NOTE — Progress Notes (Signed)
History:  42 y.o. [id number] here today for eval of AUB. She was seen by Dr. Hulan Fray on 10/20/2017: Married P5 (28, 5, 12, 37, 19 yo kids) here today for follow up of EMBX results results. She was seen in the MAU on 09-07-17 with heavy periods, severe dysmenorrhea. She has tried IBU with no help. She was started on megace which did help the bleeding. She has tried OCPs with no help. Her hbg was 10.3 on 09-07-17.  The following portions of the patient's history were reviewed and updated as appropriate: allergies, current medications, past family history, past medical history, past social history, past surgical history and problem list.  Review of Systems:  Pertinent items are noted in HPI.    Objective:  Physical Exam BP 135/81   Pulse 86   Ht 5\' 6"  (1.676 m)   Wt (!) 306 lb 8 oz (139 kg)   LMP 10/11/2017 (Approximate)   BMI 49.47 kg/m   CONSTITUTIONAL: Well-developed, well-nourished female in no acute distress.  HENT:  Normocephalic, atraumatic EYES: Conjunctivae and EOM are normal. No scleral icterus.  NECK: Normal range of motion SKIN: Skin is warm and dry. No rash noted. Not diaphoretic.No pallor. [place]: Alert and oriented to person, place, and time. Normal coordination.  Abd: obese; soft, nontender and nondistended Pelvic: Normal appearing external genitalia; normal appearing vaginal mucosa and cervix.  Normal discharge.  Enlarged uterus- difficult to palpate due to pts body habitus, no other palpable masses, no uterine or adnexal tenderness noticed  Labs and Imaging 09/10/2017 CLINICAL DATA:  Abnormal uterine bleeding, menorrhagia, fibroids, cyst follow-up  EXAM: TRANSABDOMINAL AND TRANSVAGINAL ULTRASOUND OF PELVIS  TECHNIQUE: Both transabdominal and transvaginal ultrasound examinations of the pelvis were performed. Transabdominal technique was performed for global imaging of the pelvis including uterus, ovaries, adnexal regions, and pelvic cul-de-sac. It was necessary to  proceed with endovaginal exam following the transabdominal exam to visualize the uterus, endometrium and ovaries.  COMPARISON:  10/02/2016  FINDINGS: Uterus  Measurements: 12.3 x 6.5 x 7.9 cm. Heterogeneous appearance of myometrium. Poor definition of endometrial complex. Question anterior wall leiomyoma at upper uterine segment versus artifact, 2.5 x 2.5 x 2.8 cm. Fundal leiomyoma seen on the previous exam is inadequately visualized due to body habitus and poor acoustic window on transabdominal imaging. Anterior wall scarring from prior Caesarean section. No additional uterine mass lesion.  Endometrium  Thickness: 10 mm thick.  No endometrial fluid or focal abnormality  Right ovary  Not visualized on either transabdominal or endovaginal imaging suspect related to combination of body habitus and bowel gas  Left ovary  Not visualized on either transabdominal or endovaginal imaging suspect related to combination of body habitus and bowel gas  Other findings  No free pelvic fluid.  No adnexal masses visualized.  IMPRESSION: Prior Caesarean section with anterior wall uterine scar.  Questionable small anterior wall leiomyoma versus artifact.  Heterogeneous myometrium nonspecific but could represent adenomyosis.  Nonvisualization of ovaries.  Assessment & Plan:  AUB- I reviewed the pts options. She would be a candidate for [place]. I reviewed all of her options but, she had questions about the endometrial ablation.  A relative of hers has this. She is hesitant about a hysterectomy.   I answered all of her questions and she wishes to proceed with a hysteroscopy with endometrial ablation.      Patient desires surgical management with hysteroscopy with endometrial ablation.   The risks of surgery were discussed in detail with the patient including but not  limited to: bleeding which may require transfusion or reoperation; infection which may require prolonged  hospitalization or re-hospitalization and antibiotic therapy; injury to bowel, bladder, ureters and major vessels or other surrounding organs; need for additional procedures including laparotomy; thromboembolic phenomenon, incisional problems and other postoperative or anesthesia complications.  Patient was told that the likelihood that her condition and symptoms will be treated effectively with this surgical management was very high; the postoperative expectations were also discussed in detail. The patient also understands the alternative treatment options which were discussed in full. All questions were answered.  She was told that she will be contacted by our surgical scheduler regarding the time and date of her surgery; routine preoperative instructions of having nothing to eat or drink after midnight on the day prior to surgery and also coming to the hospital 1 1/2 hours prior to her time of surgery were also emphasized.  She was told she may be called for a preoperative appointment about a week prior to surgery and will be given further preoperative instructions at that visit. Printed patient education handouts about the procedure were given to the patient to review at home.  Total face-to-face time with patient was 20 min.  Greater than 50% was spent in counseling and coordination of care with the patient.   Susie Ehresman L. Harraway-Smith, M.D., [name]

## 2017-11-08 NOTE — Patient Instructions (Signed)
Endometrial Ablation Endometrial ablation is a procedure that destroys the thin inner layer of the lining of the uterus (endometrium). This procedure may be done:  To stop heavy periods.  To stop bleeding that is causing anemia.  To control irregular bleeding.  To treat bleeding caused by small tumors (fibroids) in the endometrium.  This procedure is often an alternative to major surgery, such as removal of the uterus and cervix (hysterectomy). As a result of this procedure:  You may not be able to have children. However, if you are premenopausal (you have not gone through menopause): ? You may still have a small chance of getting pregnant. ? You will need to use a reliable method of birth control after the procedure to prevent pregnancy.  You may stop having a menstrual period, or you may have only a small amount of bleeding during your period. Menstruation may return several years after the procedure.  Tell a health care provider about:  Any allergies you have.  All medicines you are taking, including vitamins, herbs, eye drops, creams, and over-the-counter medicines.  Any problems you or family members have had with the use of anesthetic medicines.  Any blood disorders you have.  Any surgeries you have had.  Any medical conditions you have. What are the risks? Generally, this is a safe procedure. However, problems may occur, including:  A hole (perforation) in the uterus or bowel.  Infection of the uterus, bladder, or vagina.  Bleeding.  Damage to other structures or organs.  An air bubble in the lung (air embolus).  Problems with pregnancy after the procedure.  Failure of the procedure.  Decreased ability to diagnose cancer in the endometrium.  What happens before the procedure?  You will have tests of your endometrium to make sure there are no pre-cancerous cells or cancer cells present.  You may have an ultrasound of the uterus.  You may be given  medicines to thin the endometrium.  Ask your health care provider about: ? Changing or stopping your regular medicines. This is especially important if you take diabetes medicines or blood thinners. ? Taking medicines such as aspirin and ibuprofen. These medicines can thin your blood. Do not take these medicines before your procedure if your doctor tells you not to.  Plan to have someone take you home from the hospital or clinic. What happens during the procedure?  You will lie on an exam table with your feet and legs supported as in a pelvic exam.  To lower your risk of infection: ? Your health care team will wash or sanitize their hands and put on germ-free (sterile) gloves. ? Your genital area will be washed with soap.  An IV tube will be inserted into one of your veins.  You will be given a medicine to help you relax (sedative).  A surgical instrument with a light and camera (resectoscope) will be inserted into your vagina and moved into your uterus. This allows your surgeon to see inside your uterus.  Endometrial tissue will be removed using one of the following methods: ? Radiofrequency. This method uses a radiofrequency-alternating electric current to remove the endometrium. ? Cryotherapy. This method uses extreme cold to freeze the endometrium. ? Heated-free liquid. This method uses a heated saltwater (saline) solution to remove the endometrium. ? Microwave. This method uses high-energy microwaves to heat up the endometrium and remove it. ? Thermal balloon. This method involves inserting a catheter with a balloon tip into the uterus. The balloon tip is   filled with heated fluid to remove the endometrium. The procedure may vary among health care providers and hospitals. What happens after the procedure?  Your blood pressure, heart rate, breathing rate, and blood oxygen level will be monitored until the medicines you were given have worn off.  As tissue healing occurs, you may  notice vaginal bleeding for 4-6 weeks after the procedure. You may also experience: ? Cramps. ? Thin, watery vaginal discharge that is light pink or brown in color. ? A need to urinate more frequently than usual. ? Nausea.  Do not drive for 24 hours if you were given a sedative.  Do not have sex or insert anything into your vagina until your health care provider approves. Summary  Endometrial ablation is done to treat the many causes of heavy menstrual bleeding.  The procedure may be done only after medications have been tried to control the bleeding.  Plan to have someone take you home from the hospital or clinic. This information is not intended to replace advice given to you by your health care provider. Make sure you discuss any questions you have with your health care provider. Document Released: 05/01/2004 Document Revised: 07/09/2016 Document Reviewed: 07/09/2016 Elsevier Interactive Patient Education  2017 [employer]  

## 2017-12-02 ENCOUNTER — Encounter (INDEPENDENT_AMBULATORY_CARE_PROVIDER_SITE_OTHER): Payer: Self-pay

## 2017-12-03 ENCOUNTER — Encounter (HOSPITAL_COMMUNITY)
Admission: RE | Admit: 2017-12-03 | Discharge: 2017-12-03 | Disposition: A | Discharge status: Home / Self Care | Payer: Medicaid Other | Source: Ambulatory Visit | Attending: Obstetrics & Gynecology | Admitting: Obstetrics & Gynecology

## 2017-12-03 ENCOUNTER — Other Ambulatory Visit: Payer: Self-pay

## 2017-12-03 ENCOUNTER — Encounter (HOSPITAL_BASED_OUTPATIENT_CLINIC_OR_DEPARTMENT_OTHER): Payer: Self-pay | Admitting: Emergency Medicine

## 2017-12-03 DIAGNOSIS — E119 Type 2 diabetes mellitus without complications: Secondary | ICD-10-CM | POA: Diagnosis not present

## 2017-12-03 DIAGNOSIS — I10 Essential (primary) hypertension: Secondary | ICD-10-CM | POA: Diagnosis not present

## 2017-12-03 DIAGNOSIS — Z01812 Encounter for preprocedural laboratory examination: Secondary | ICD-10-CM | POA: Insufficient documentation

## 2017-12-03 DIAGNOSIS — R000 Tachycardia, unspecified: Secondary | ICD-10-CM | POA: Insufficient documentation

## 2017-12-03 DIAGNOSIS — Z01810 Encounter for preprocedural cardiovascular examination: Secondary | ICD-10-CM | POA: Insufficient documentation

## 2017-12-03 LAB — BASIC METABOLIC PANEL
ANION GAP: 10 (ref 5–15)
BUN: 11 mg/dL (ref 6–20)
CHLORIDE: 105 mmol/L (ref 101–111)
CO2: 24 mmol/L (ref 22–32)
Calcium: 9.3 mg/dL (ref 8.9–10.3)
Creatinine, Ser: 0.89 mg/dL (ref 0.44–1.00)
GFR calc non Af Amer: >60 mL/min (ref >60)
Glucose, Bld: 330 mg/dL — ABNORMAL HIGH (ref 65–99)
POTASSIUM: 4.3 mmol/L (ref 3.5–5.1)
Sodium: 139 mmol/L (ref 135–145)

## 2017-12-03 LAB — CBC
HEMATOCRIT: 35.2 % — AB (ref 36.0–46.0)
HEMOGLOBIN: 11.1 g/dL — AB (ref 12.0–15.0)
MCH: 24.8 pg — AB (ref 26.0–34.0)
MCHC: 31.5 g/dL (ref 30.0–36.0)
MCV: 78.6 fL (ref 78.0–100.0)
Platelets: 421 10*3/uL — ABNORMAL HIGH (ref 150–400)
RBC: 4.48 MIL/uL (ref 3.87–5.11)
RDW: 16.8 % — ABNORMAL HIGH (ref 11.5–15.5)
WBC: 7.9 10*3/uL (ref 4.0–10.5)

## 2017-12-03 NOTE — Progress Notes (Addendum)
SPOKE WITH: patient  RIDING HOME WITH: daughter Genelle Gather [phone] AM MEDICATIONS: albuterol inhaler prn (to bring), almodipine , cetrizine, rosvastatin, flonase nasal spray prn    NPO STATUS: npo solids after mn, clear liquids until 0800, total npo after 0800, sips of water with meds ok, advised no smoking x24 hours before surgery  LABS: cbc, bmp, ekg (htn, diabetic) pre-op, upreg day of surgery  COMMENTS/CONCERNS:  N/a

## 2017-12-03 NOTE — Progress Notes (Signed)
Called and lvm for Ruth [name], or scheduler for dr Ihor Dow, that pt camed in today for lab work and to get weighed since pt's BMI 49 and be evaluated by anesthesia.  Pt height and weight taken and pt BMI 52.2 , so is not a candidate for ambulatory surgery center  per anesthesia guidelines of BMI 50 or greater pt needs to be moved to  Main OR.

## 2017-12-03 NOTE — Progress Notes (Signed)
Patient arrived to PST for bloodwork. RN obtained patient weight and height. BMI calculated as 52.2. Per anesthesia guidelines for [facility] as pertains to BMI, RN called and spoke to anesthesia Dr C. Green to determine if patient could be safely done at [facility] and let Dr Nyoka Cowden know that patient was here now. Dr Nyoka Cowden told RN to wait 1 hour and if she did not call back, RN should call Dr Ambrose Pancoast as he would be on call by then and ask him about this patient. RN verbalized understanding.

## 2017-12-06 NOTE — Progress Notes (Signed)
Called patient and informed that surgery on 12/07/2017 had been moved from [facility] to [place].  preop instrudtions reviewed with patient.

## 2017-12-07 ENCOUNTER — Encounter (HOSPITAL_COMMUNITY): Payer: Self-pay | Admitting: Emergency Medicine

## 2017-12-07 ENCOUNTER — Other Ambulatory Visit: Payer: Self-pay

## 2017-12-07 ENCOUNTER — Ambulatory Visit (HOSPITAL_COMMUNITY)
Admission: RE | Admit: 2017-12-07 | Discharge: 2017-12-07 | Disposition: A | Discharge status: Home / Self Care | Payer: Medicaid Other | Source: Ambulatory Visit | Attending: Obstetrics & Gynecology | Admitting: Obstetrics & Gynecology

## 2017-12-07 ENCOUNTER — Ambulatory Visit (HOSPITAL_COMMUNITY): Payer: Medicaid Other | Admitting: Anesthesiology

## 2017-12-07 ENCOUNTER — Encounter (HOSPITAL_COMMUNITY)
Admission: RE | Disposition: A | Discharge status: Home / Self Care | Payer: Self-pay | Source: Ambulatory Visit | Attending: Obstetrics & Gynecology

## 2017-12-07 DIAGNOSIS — N938 Other specified abnormal uterine and vaginal bleeding: Secondary | ICD-10-CM | POA: Insufficient documentation

## 2017-12-07 DIAGNOSIS — N939 Abnormal uterine and vaginal bleeding, unspecified: Secondary | ICD-10-CM

## 2017-12-07 DIAGNOSIS — F17210 Nicotine dependence, cigarettes, uncomplicated: Secondary | ICD-10-CM | POA: Insufficient documentation

## 2017-12-07 DIAGNOSIS — Z79899 Other long term (current) drug therapy: Secondary | ICD-10-CM | POA: Diagnosis not present

## 2017-12-07 DIAGNOSIS — Z6841 Body Mass Index (BMI) 40.0 and over, adult: Secondary | ICD-10-CM | POA: Diagnosis not present

## 2017-12-07 DIAGNOSIS — Z79891 Long term (current) use of opiate analgesic: Secondary | ICD-10-CM | POA: Insufficient documentation

## 2017-12-07 DIAGNOSIS — N840 Polyp of corpus uteri: Secondary | ICD-10-CM | POA: Insufficient documentation

## 2017-12-07 DIAGNOSIS — E119 Type 2 diabetes mellitus without complications: Secondary | ICD-10-CM | POA: Insufficient documentation

## 2017-12-07 DIAGNOSIS — Z7984 Long term (current) use of oral hypoglycemic drugs: Secondary | ICD-10-CM | POA: Diagnosis not present

## 2017-12-07 DIAGNOSIS — Z79818 Long term (current) use of other agents affecting estrogen receptors and estrogen levels: Secondary | ICD-10-CM | POA: Diagnosis not present

## 2017-12-07 DIAGNOSIS — J45909 Unspecified asthma, uncomplicated: Secondary | ICD-10-CM | POA: Insufficient documentation

## 2017-12-07 DIAGNOSIS — E669 Obesity, unspecified: Secondary | ICD-10-CM | POA: Diagnosis present

## 2017-12-07 DIAGNOSIS — G4730 Sleep apnea, unspecified: Secondary | ICD-10-CM | POA: Diagnosis not present

## 2017-12-07 DIAGNOSIS — Z9989 Dependence on other enabling machines and devices: Secondary | ICD-10-CM | POA: Insufficient documentation

## 2017-12-07 DIAGNOSIS — I10 Essential (primary) hypertension: Secondary | ICD-10-CM | POA: Diagnosis not present

## 2017-12-07 DIAGNOSIS — F319 Bipolar disorder, unspecified: Secondary | ICD-10-CM | POA: Diagnosis present

## 2017-12-07 HISTORY — DX: Unspecified osteoarthritis, unspecified site: M19.90

## 2017-12-07 HISTORY — DX: Anemia, unspecified: D64.9

## 2017-12-07 HISTORY — DX: Personal history of other diseases of the nervous system and sense organs: Z86.69

## 2017-12-07 HISTORY — PX: DILITATION & CURRETTAGE/HYSTROSCOPY WITH NOVASURE ABLATION: SHX5568

## 2017-12-07 LAB — PREGNANCY, URINE: PREG TEST UR: NEGATIVE

## 2017-12-07 LAB — GLUCOSE, CAPILLARY
Glucose-Capillary: 205 mg/dL — ABNORMAL HIGH (ref 65–99)
Glucose-Capillary: 184 mg/dL — ABNORMAL HIGH (ref 65–99)

## 2017-12-07 SURGERY — DILATATION & CURETTAGE/HYSTEROSCOPY WITH NOVASURE ABLATION
Anesthesia: General | Site: Uterus

## 2017-12-07 MED ORDER — PROPOFOL 10 MG/ML IV BOLUS
INTRAVENOUS | Status: DC | PRN
  Administered 2017-12-07: 200 mg via INTRAVENOUS

## 2017-12-07 MED ORDER — LIDOCAINE HCL (CARDIAC) PF 100 MG/5ML IV SOSY
PREFILLED_SYRINGE | INTRAVENOUS | Status: DC | PRN
  Administered 2017-12-07: 100 mg via INTRAVENOUS

## 2017-12-07 MED ORDER — LABETALOL HCL 5 MG/ML IV SOLN
INTRAVENOUS | Status: DC | PRN
  Administered 2017-12-07: 5 mg via INTRAVENOUS

## 2017-12-07 MED ORDER — BUPIVACAINE HCL (PF) 0.25 % IJ SOLN
INTRAMUSCULAR | Status: AC
  Filled 2017-12-07: qty 30

## 2017-12-07 MED ORDER — BUPIVACAINE HCL (PF) 0.5 % IJ SOLN
INTRAMUSCULAR | Status: DC | PRN
  Administered 2017-12-07: 20 mL

## 2017-12-07 MED ORDER — FENTANYL CITRATE (PF) 100 MCG/2ML IJ SOLN
INTRAMUSCULAR | Status: AC
  Filled 2017-12-07: qty 2

## 2017-12-07 MED ORDER — ROCURONIUM BROMIDE 10 MG/ML (PF) SYRINGE
PREFILLED_SYRINGE | INTRAVENOUS | Status: AC
  Filled 2017-12-07: qty 5

## 2017-12-07 MED ORDER — SCOPOLAMINE 1 MG/3DAYS TD PT72
MEDICATED_PATCH | TRANSDERMAL | Status: AC
  Administered 2017-12-07: 1.5 mg via TRANSDERMAL
  Filled 2017-12-07: qty 1

## 2017-12-07 MED ORDER — ONDANSETRON HCL 4 MG/2ML IJ SOLN
INTRAMUSCULAR | Status: DC | PRN
  Administered 2017-12-07: 4 mg via INTRAVENOUS

## 2017-12-07 MED ORDER — DEXAMETHASONE SODIUM PHOSPHATE 10 MG/ML IJ SOLN
INTRAMUSCULAR | Status: DC | PRN
  Administered 2017-12-07: 10 mg via INTRAVENOUS

## 2017-12-07 MED ORDER — SCOPOLAMINE 1 MG/3DAYS TD PT72
1.0000 | MEDICATED_PATCH | TRANSDERMAL | Status: DC
  Administered 2017-12-07: 1.5 mg via TRANSDERMAL

## 2017-12-07 MED ORDER — HYDROCODONE-ACETAMINOPHEN 5-325 MG PO TABS
ORAL_TABLET | ORAL | Status: AC
  Filled 2017-12-07: qty 1

## 2017-12-07 MED ORDER — PROPOFOL 10 MG/ML IV BOLUS
INTRAVENOUS | Status: AC
  Filled 2017-12-07: qty 20

## 2017-12-07 MED ORDER — HYDROCODONE-ACETAMINOPHEN 7.5-325 MG PO TABS
ORAL_TABLET | ORAL | Status: AC
  Filled 2017-12-07: qty 1

## 2017-12-07 MED ORDER — MEPERIDINE HCL 50 MG/ML IJ SOLN: 6.2500 mg | INTRAMUSCULAR | Status: DC | PRN

## 2017-12-07 MED ORDER — LACTATED RINGERS IV SOLN
INTRAVENOUS | Status: DC
  Administered 2017-12-07: 14:00:00 via INTRAVENOUS

## 2017-12-07 MED ORDER — METOCLOPRAMIDE HCL 5 MG/ML IJ SOLN: 10.0000 mg | Freq: Once | INTRAMUSCULAR | Status: DC | PRN

## 2017-12-07 MED ORDER — MIDAZOLAM HCL 2 MG/2ML IJ SOLN
INTRAMUSCULAR | Status: AC
  Filled 2017-12-07: qty 2

## 2017-12-07 MED ORDER — FENTANYL CITRATE (PF) 100 MCG/2ML IJ SOLN
INTRAMUSCULAR | Status: DC | PRN
  Administered 2017-12-07: 100 ug via INTRAVENOUS
  Administered 2017-12-07: 25 ug via INTRAVENOUS

## 2017-12-07 MED ORDER — SODIUM CHLORIDE 0.9 % IR SOLN
Status: DC | PRN
  Administered 2017-12-07: 3000 mL

## 2017-12-07 MED ORDER — FENTANYL CITRATE (PF) 100 MCG/2ML IJ SOLN
25.0000 ug | INTRAMUSCULAR | Status: DC | PRN
  Administered 2017-12-07: 25 ug via INTRAVENOUS

## 2017-12-07 MED ORDER — LIDOCAINE 2% (20 MG/ML) 5 ML SYRINGE
INTRAMUSCULAR | Status: AC
  Filled 2017-12-07: qty 5

## 2017-12-07 MED ORDER — LACTATED RINGERS IV SOLN: INTRAVENOUS | Status: DC

## 2017-12-07 MED ORDER — HYDROCODONE-ACETAMINOPHEN 7.5-325 MG PO TABS
1.0000 | ORAL_TABLET | Freq: Once | ORAL | Status: AC | PRN
  Administered 2017-12-07: 1 via ORAL

## 2017-12-07 MED ORDER — BUPIVACAINE HCL (PF) 0.5 % IJ SOLN
INTRAMUSCULAR | Status: AC
  Filled 2017-12-07: qty 30

## 2017-12-07 MED ORDER — MIDAZOLAM HCL 5 MG/5ML IJ SOLN
INTRAMUSCULAR | Status: DC | PRN
  Administered 2017-12-07: 2 mg via INTRAVENOUS

## 2017-12-07 MED ORDER — IBUPROFEN 600 MG PO TABS: 600.0000 mg | ORAL_TABLET | Freq: Four times a day (QID) | ORAL | 0 refills | Status: DC | PRN

## 2017-12-07 SURGICAL SUPPLY — 7 items
ABLATOR ENDOMETRIAL BIPOLAR (ABLATOR) ×2 IMPLANT
CATH ROBINSON RED A/P 16FR (CATHETERS) ×2 IMPLANT
COVER SURGICAL LIGHT HANDLE (MISCELLANEOUS) ×2 IMPLANT
PACK MINOR VAGINAL W LONG (CUSTOM PROCEDURE TRAY) ×2 IMPLANT
SET GENESYS HTA PROCERVA (MISCELLANEOUS) ×2 IMPLANT
TUBING AQUILEX INFLOW (TUBING) ×2 IMPLANT
TUBING AQUILEX OUTFLOW (TUBING) ×2 IMPLANT

## 2017-12-07 NOTE — Brief Op Note (Signed)
12/07/2017  4:17 PM  PATIENT:  Ruth [name]  42 y.o. female  PRE-OPERATIVE DIAGNOSIS:  DUB  POST-OPERATIVE DIAGNOSIS:  DUB  PROCEDURE:  Procedure(s): DILATATION & CURETTAGE/HYSTEROSCOPY WITH ATTEMPED NOVASURE ABLATION, FAILED/ENDOMETERIAL ABLATION WITH HTA SYSTEM (N/A)  SURGEON:  Surgeon(s) and Role:    * Lavonia Drafts, MD - Primary  ANESTHESIA:   general and paracervical block  EBL:  5 mL   BLOOD ADMINISTERED:none  DRAINS: none   LOCAL MEDICATIONS USED:  MARCAINE     SPECIMEN:  Source of Specimen:  endometrial currettings  DISPOSITION OF SPECIMEN:  PATHOLOGY  COUNTS:  YES  TOURNIQUET:  * No tourniquets in log *  DICTATION: .Note written in EPIC  PLAN OF CARE: Discharge to home after PACU  PATIENT DISPOSITION:  PACU - hemodynamically stable.   Delay start of Pharmacological VTE agent (>24hrs) due to surgical blood loss or risk of bleeding: yes  Complications: none immediate  Beth Goodlin L. Harraway-Smith, M.D., [name]

## 2017-12-07 NOTE — Progress Notes (Signed)
Pt is not jehovah's witness, but would rather not receive blood transfusion if needed. Pt is okay with family (tatiana, daughter only) to make decision if pt needs blood transfusion in "life or death" situation.  Pt does not wish to sign blood refusal form due to this information.

## 2017-12-07 NOTE — Progress Notes (Signed)
Dr. Ihor Dow added to consent form "possible fibroid resection."  Pt and Dr. Ihor Dow initialed next to addendum.

## 2017-12-07 NOTE — Discharge Instructions (Signed)
Hysteroscopy, Care After Refer to this sheet in the next few weeks. These instructions provide you with information on caring for yourself after your procedure. Your health care provider may also give you more specific instructions. Your treatment has been planned according to current medical practices, but problems sometimes occur. Call your health care provider if you have any problems or questions after your procedure. What can I expect after the procedure? After your procedure, it is typical to have the following:  You may have some cramping. This normally lasts for a couple days.  You may have bleeding. This can vary from light spotting for a few days to menstrual-like bleeding for 3-7 days.  Follow these instructions at home:  Rest for the first 1-2 days after the procedure.  Only take over-the-counter or prescription medicines as directed by your health care provider. Do not take aspirin. It can increase the chances of bleeding.  Take showers instead of baths for 2 weeks or as directed by your health care provider.  Do not drive for 24 hours or as directed.  Do not drink alcohol while taking pain medicine.  Do not use tampons, douche, or have sexual intercourse for 2 weeks or until your health care provider says it is okay.  Take your temperature twice a day for 4-5 days. Write it down each time.  Follow your health care provider's advice about diet, exercise, and lifting.  If you develop constipation, you may: ? Take a mild laxative if your health care provider approves. ? Add bran foods to your diet. ? Drink enough fluids to keep your urine clear or pale yellow.  Try to have someone with you or available to you for the first 24-48 hours, especially if you were given a general anesthetic.  Follow up with your health care provider as directed. Contact a health care provider if:  You feel dizzy or lightheaded.  You feel sick to your stomach (nauseous).  You have  abnormal vaginal discharge.  You have a rash.  You have pain that is not controlled with medicine. Get help right away if:  You have bleeding that is heavier than a normal menstrual period.  You have a fever.  You have increasing cramps or pain, not controlled with medicine.  You have new belly (abdominal) pain.  You pass out.  You have pain in the tops of your shoulders (shoulder strap areas).  You have shortness of breath. This information is not intended to replace advice given to you by your health care provider. Make sure you discuss any questions you have with your health care provider. Document Released: 04/12/2013 Document Revised: 11/28/2015 Document Reviewed: 01/19/2013 Elsevier Interactive Patient Education  2017 [employer]. Endometrial Ablation Endometrial ablation is a procedure that destroys the thin inner layer of the lining of the uterus (endometrium). This procedure may be done:  To stop heavy periods.  To stop bleeding that is causing anemia.  To control irregular bleeding.  To treat bleeding caused by small tumors (fibroids) in the endometrium.  This procedure is often an alternative to major surgery, such as removal of the uterus and cervix (hysterectomy). As a result of this procedure:  You may not be able to have children. However, if you are premenopausal (you have not gone through menopause): ? You may still have a small chance of getting pregnant. ? You will need to use a reliable method of birth control after the procedure to prevent pregnancy.  You may stop having a  menstrual period, or you may have only a small amount of bleeding during your period. Menstruation may return several years after the procedure.  Tell a health care provider about:  Any allergies you have.  All medicines you are taking, including vitamins, herbs, eye drops, creams, and over-the-counter medicines.  Any problems you or family members have had with the use of  anesthetic medicines.  Any blood disorders you have.  Any surgeries you have had.  Any medical conditions you have. What are the risks? Generally, this is a safe procedure. However, problems may occur, including:  A hole (perforation) in the uterus or bowel.  Infection of the uterus, bladder, or vagina.  Bleeding.  Damage to other structures or organs.  An air bubble in the lung (air embolus).  Problems with pregnancy after the procedure.  Failure of the procedure.  Decreased ability to diagnose cancer in the endometrium.  What happens before the procedure?  You will have tests of your endometrium to make sure there are no pre-cancerous cells or cancer cells present.  You may have an ultrasound of the uterus.  You may be given medicines to thin the endometrium.  Ask your health care provider about: ? Changing or stopping your regular medicines. This is especially important if you take diabetes medicines or blood thinners. ? Taking medicines such as aspirin and ibuprofen. These medicines can thin your blood. Do not take these medicines before your procedure if your doctor tells you not to.  Plan to have someone take you home from the hospital or clinic. What happens during the procedure?  You will lie on an exam table with your feet and legs supported as in a pelvic exam.  To lower your risk of infection: ? Your health care team will wash or sanitize their hands and put on germ-free (sterile) gloves. ? Your genital area will be washed with soap.  An IV tube will be inserted into one of your veins.  You will be given a medicine to help you relax (sedative).  A surgical instrument with a light and camera (resectoscope) will be inserted into your vagina and moved into your uterus. This allows your surgeon to see inside your uterus.  Endometrial tissue will be removed using one of the following methods: ? Radiofrequency. This method uses a radiofrequency-alternating  electric current to remove the endometrium. ? Cryotherapy. This method uses extreme cold to freeze the endometrium. ? Heated-free liquid. This method uses a heated saltwater (saline) solution to remove the endometrium. ? Microwave. This method uses high-energy microwaves to heat up the endometrium and remove it. ? Thermal balloon. This method involves inserting a catheter with a balloon tip into the uterus. The balloon tip is filled with heated fluid to remove the endometrium. The procedure may vary among health care providers and hospitals. What happens after the procedure?  Your blood pressure, heart rate, breathing rate, and blood oxygen level will be monitored until the medicines you were given have worn off.  As tissue healing occurs, you may notice vaginal bleeding for 4-6 weeks after the procedure. You may also experience: ? Cramps. ? Thin, watery vaginal discharge that is light pink or brown in color. ? A need to urinate more frequently than usual. ? Nausea.  Do not drive for 24 hours if you were given a sedative.  Do not have sex or insert anything into your vagina until your health care provider approves. Summary  Endometrial ablation is done to treat the many causes  of heavy menstrual bleeding.  The procedure may be done only after medications have been tried to control the bleeding.  Plan to have someone take you home from the hospital or clinic. This information is not intended to replace advice given to you by your health care provider. Make sure you discuss any questions you have with your health care provider. Document Released: 05/01/2004 Document Revised: 07/09/2016 Document Reviewed: 07/09/2016 Elsevier Interactive Patient Education  2017 [employer].

## 2017-12-07 NOTE — Transfer of Care (Signed)
Immediate Anesthesia Transfer of Care Note  Patient: Ruth [name]  Procedure(s) Performed: DILATATION & CURETTAGE/HYSTEROSCOPY WITH ATTEMPED NOVASURE ABLATION, FAILED/ENDOMETERIAL ABLATION WITH HTA SYSTEM (N/A Uterus)  Patient Location: PACU  Anesthesia Type:General  Level of Consciousness: awake, alert , oriented and patient cooperative  Airway & Oxygen Therapy: Patient Spontanous Breathing and Patient connected to face mask oxygen  Post-op Assessment: Report given to RN, Post -op Vital signs reviewed and stable and Patient moving all extremities X 4  Post vital signs: stable  Last Vitals:  Vitals Value Taken Time  BP 140/93 12/07/2017  4:09 PM  Temp    Pulse 86 12/07/2017  4:12 PM  Resp 26 12/07/2017  4:12 PM  SpO2 94 % 12/07/2017  4:12 PM  Vitals shown include unvalidated device data.  Last Pain:  Vitals:   12/07/17 1354  TempSrc:   PainSc: 0-No pain      Patients Stated Pain Goal: 4 (03/83/33 8329)  Complications: No apparent anesthesia complications

## 2017-12-07 NOTE — Anesthesia Preprocedure Evaluation (Signed)
Anesthesia Evaluation  Patient identified by MRN, date of birth, ID band Patient awake    Reviewed: Allergy & Precautions, NPO status , Patient's Chart, lab work & pertinent test results  Airway Mallampati: III       Dental no notable dental hx. (+) Teeth Intact   Pulmonary asthma , sleep apnea and Continuous Positive Airway Pressure Ventilation , Current Smoker,    Pulmonary exam normal breath sounds clear to auscultation       Cardiovascular hypertension, Pt. on medications Normal cardiovascular exam Rhythm:Regular Rate:Normal     Neuro/Psych  Headaches, PSYCHIATRIC DISORDERS Bipolar Disorder  Neuromuscular disease    GI/Hepatic negative GI ROS, Neg liver ROS,   Endo/Other  diabetes, Well Controlled, Type 2, Oral Hypoglycemic AgentsHypothyroidism Morbid obesity  Renal/GU negative Renal ROS     Musculoskeletal  (+) Arthritis , Osteoarthritis,  Fibromyalgia -  Abdominal (+) + obese,   Peds  Hematology  (+) anemia ,   Anesthesia Other Findings   Reproductive/Obstetrics DUB                             Anesthesia Physical Anesthesia Plan  ASA: III  Anesthesia Plan: General   Post-op Pain Management:    Induction: Intravenous  PONV Risk Score and Plan: 4 or greater and Midazolam, Dexamethasone, Ondansetron and Treatment may vary due to age or medical condition  Airway Management Planned: LMA  Additional Equipment:   Intra-op Plan:   Post-operative Plan: Extubation in OR  Informed Consent: I have reviewed the patients History and Physical, chart, labs and discussed the procedure including the risks, benefits and alternatives for the proposed anesthesia with the patient or authorized representative who has indicated his/her understanding and acceptance.   Dental advisory given  Plan Discussed with: CRNA  Anesthesia Plan Comments:         Anesthesia Quick Evaluation

## 2017-12-07 NOTE — Op Note (Signed)
12/07/2017  4:17 PM  PATIENT:  Ruth [name]  42 y.o. female  PRE-OPERATIVE DIAGNOSIS:  DUB  POST-OPERATIVE DIAGNOSIS:  DUB  PROCEDURE:  Procedure(s): DILATATION & CURETTAGE/HYSTEROSCOPY WITH ATTEMPED NOVASURE ABLATION, FAILED/ENDOMETERIAL ABLATION WITH HTA SYSTEM (N/A)  SURGEON:  Surgeon(s) and Role:    * Lavonia Drafts, MD - Primary  ANESTHESIA:   general and paracervical block  EBL:  5 mL   BLOOD ADMINISTERED:none  DRAINS: none   LOCAL MEDICATIONS USED:  MARCAINE     SPECIMEN:  Source of Specimen:  endometrial currettings  DISPOSITION OF SPECIMEN:  PATHOLOGY  COUNTS:  YES  TOURNIQUET:  * No tourniquets in log *  DICTATION: .Note written in EPIC  PLAN OF CARE: Discharge to home after PACU  PATIENT DISPOSITION:  PACU - hemodynamically stable.   Delay start of Pharmacological VTE agent (>24hrs) due to surgical blood loss or risk of bleeding: yes   Indications: pt is a 42 yo AA female with AUB. Pt was referred to me for robotic hysterectomy. She requested a hysteroscopy with endometrial ablation.      The risks, benefits, and alternatives of surgery were explained, understood, and accepted. The consents were signed and all questions were answered. She was taken to the operating room and general anesthesia was applied without complication. She was placed in the dorsal lithotomy position and her vagina and abdomen were prepped and draped in the usual sterile fashion. A bimanual exam revealed a normal size and shape anteverted mobile uterus. Her adnexa were non-enlarged.   A bivalved speculum was placed in the patients' vagina and the anterior lip of the cervix was grasped with a single toothed tenaculum. A paracervical block was performed at 5 and 7 o'clock with 10cc of 0.5% Marcaine.   The endometrial cavity was sounded to 11.5cm and the endocervical length measured 3.5cm. A hysteroscope was inserted and the endometrium was noted to be thickened with several  polyps.  The ostia on both sides were noted.  The scope was removed and a sharp currete was used to scape the lining of the uterus until a gritty texture was noted throughout.  Specimens were sent to pathology.  The NovaSure device was then inserted and seated using 6.5cm as the cavity length but, the cavity width would not advance beyond the red zone. Another Novasure device was inserted with the same results. At this point the HTA device was inserted without difficulty and after priming a activation time of 1min occurred. There was an even char pattern.  The single toothed tenaculum was removed at the end of the case and no bleeding was noted from the cervix.   The patient was extubated and taken to the recovery room in stable condition.  Sponge, lap and instrument counts were correct.  There were no complications.   Ruth [name], M.D., [name]

## 2017-12-07 NOTE — Anesthesia Procedure Notes (Signed)
Procedure Name: LMA Insertion Date/Time: 12/07/2017 2:53 PM Performed by: Lissa Morales, CRNA Pre-anesthesia Checklist: Patient identified, Emergency Drugs available, Suction available and Patient being monitored Patient Re-evaluated:Patient Re-evaluated prior to induction Oxygen Delivery Method: Circle system utilized Preoxygenation: Pre-oxygenation with 100% oxygen (sump  suctioned ) Induction Type: IV induction Ventilation: Mask ventilation without difficulty LMA: LMA with gastric port inserted LMA Size: 4.0 Tube type: Oral Number of attempts: 1 Airway Equipment and Method: Oral airway Placement Confirmation: ETT inserted through vocal cords under direct vision,  positive ETCO2 and breath sounds checked- equal and bilateral Tube secured with: Tape Dental Injury: Teeth and Oropharynx as per pre-operative assessment

## 2017-12-07 NOTE — H&P (Signed)
Preoperative History and Physical  Ruth [name] is a 42 y.o. [id number] here for surgical management of AUB.   Proposed surgery: Hysteroscopy with endometrial ablation using Novasure. Possible fibroid resection.    Past Medical History:  Diagnosis Date  . Anemia   . Arthritis    chronic back pain   . Asthma   . Bipolar affective disorder ([place])   . Bronchitis   . Diabetes mellitus    Type 2  . Fibromyalgia   . Hx of migraines    occ  . Hypertension   . Obesity   . Thyroid disease    Past Surgical History:  Procedure Laterality Date  . CARPAL TUNNEL RELEASE Right   . CESAREAN SECTION     x2  . ELBOW SURGERY     "surgery for nerve pain"  . GANGLION CYST EXCISION Right    right arm   . TONSILLECTOMY     childhood  . TUBAL LIGATION    . VAGINA SURGERY     fistula   OB History    Gravida  7   Para  5   Term  4   Preterm  1   AB  2   Living  5     SAB  2   TAB      Ectopic      Multiple      Live Births  4          Patient denies any cervical dysplasia or STIs. Medications Prior to Admission  Medication Sig Dispense Refill Last Dose  . albuterol (PROVENTIL HFA;VENTOLIN HFA) 108 (90 Base) MCG/ACT inhaler Inhale 1-2 puffs into the lungs every 6 (six) hours as needed for wheezing.   12/07/2017 at Unknown time  . amLODipine (NORVASC) 5 MG tablet Take 5 mg by mouth daily.  3 12/06/2017 at Unknown time  . BELBUCA 75 MCG FILM Take 1 Film by mouth every 12 (twelve) hours.   0 Past Month at Unknown time  . cetirizine (ZYRTEC) 10 MG tablet Take 10 mg by mouth daily.   Past Month at Unknown time  . fluticasone (FLONASE) 50 MCG/ACT nasal spray Place 2 sprays into the nose daily as needed for allergies.   Past Month at Unknown time  . ibuprofen (ADVIL,MOTRIN) 800 MG tablet Take 800 mg by mouth every 8 (eight) hours as needed for moderate pain.   Past Week at Unknown time  . megestrol (MEGACE) 40 MG tablet Take one tablet daily. 60 tablet 3 12/06/2017 at Unknown  time  . metFORMIN (GLUCOPHAGE-XR) 500 MG 24 hr tablet Take 500 mg by mouth 2 (two) times daily.  2 Past Week at Unknown time  . rosuvastatin (CRESTOR) 10 MG tablet Take 10 mg by mouth daily.  1 12/06/2017 at Unknown time  . triamterene-hydrochlorothiazide (MAXZIDE-25) 37.5-25 MG tablet Take 1 tablet by mouth daily.  3 12/06/2017 at Unknown time  . doxycycline (VIBRAMYCIN) 100 MG capsule Take 1 capsule (100 mg total) by mouth 2 (two) times daily. (Patient not taking: Reported on 11/08/2017) 20 capsule 0 Not Taking at Unknown time  . ketorolac (TORADOL) 10 MG tablet Take 1 tablet (10 mg total) by mouth every 6 (six) hours as needed. (Patient not taking: Reported on 12/07/2017) 20 tablet 0 Not Taking at Unknown time  . neomycin-polymyxin-hydrocortisone (CORTISPORIN) 3.5-10000-1 OTIC suspension Place 4 drops into the right ear 3 (three) times daily. (Patient not taking: Reported on 12/07/2017) 15 mL 0 Not Taking at Unknown time  .  oxyCODONE-acetaminophen (PERCOCET/ROXICET) 5-325 MG tablet Take 1-2 tablets by mouth every 6 (six) hours as needed for severe pain. (Patient not taking: Reported on 12/07/2017) 20 tablet 0 Not Taking at Unknown time    Allergies  Allergen Reactions  . Penicillins Other (See Comments)    Unknown; from childhood Has patient had a PCN reaction causing immediate rash, facial/tongue/throat swelling, SOB or lightheadedness with hypotension: Unknow Has patient had a PCN reaction causing severe rash involving mucus membranes or skin necrosis: Unknown Has patient had a PCN reaction that required hospitalization: Unknown Has patient had a PCN reaction occurring within the last 10 years: Unknown If all of the above answers are "NO", then may proceed with Cephalosporin use.    Social History:   reports that she has been smoking cigarettes.  She has been smoking about 0.25 packs per day. She has never used smokeless tobacco. She reports that she has current or past drug history. Drug: Marijuana.  She reports that she does not drink alcohol. Family History  Problem Relation Age of Onset  . Healthy Mother   . Healthy Father   . Breast cancer Neg Hx     Review of Systems: Noncontributory  PHYSICAL EXAM: Blood pressure (!) 137/96, pulse 92, temperature 97.9 F (36.6 C), temperature source Oral, resp. rate 18, height 5' 4.5" (1.638 m), weight (!) 307 lb 9.6 oz (139.5 kg), last menstrual period 09/05/2017, SpO2 100 %. General appearance - alert, well appearing, and in no distress Chest - clear to auscultation, no wheezes, rales or rhonchi, symmetric air entry Heart - normal rate and regular rhythm Abdomen - soft, nontender, nondistended, no masses or organomegaly Pelvic - examination not indicated Extremities - peripheral pulses normal, no pedal edema, no clubbing or cyanosis  Labs: Results for orders placed or performed during the hospital encounter of 12/07/17 (from the past 336 hour(s))  Pregnancy, urine   Collection Time: 12/07/17  1:01 PM  Result Value Ref Range   Preg Test, Ur NEGATIVE NEGATIVE  Glucose, capillary   Collection Time: 12/07/17  1:02 PM  Result Value Ref Range   Glucose-Capillary 205 (H) 65 - 99 mg/dL   Comment 1 Notify RN   Results for orders placed or performed during the hospital encounter of 12/03/17 (from the past 336 hour(s))  Basic metabolic panel   Collection Time: 12/03/17  1:49 PM  Result Value Ref Range   Sodium 139 135 - 145 mmol/L   Potassium 4.3 3.5 - 5.1 mmol/L   Chloride 105 101 - 111 mmol/L   CO2 24 22 - 32 mmol/L   Glucose, Bld 330 (H) 65 - 99 mg/dL   BUN 11 6 - 20 mg/dL   Creatinine, Ser 0.89 0.44 - 1.00 mg/dL   Calcium 9.3 8.9 - 10.3 mg/dL   GFR calc non Af Amer >60 >60 mL/min   GFR calc Af Amer >60 >60 mL/min   Anion gap 10 5 - 15  CBC   Collection Time: 12/03/17  1:49 PM  Result Value Ref Range   WBC 7.9 4.0 - 10.5 K/uL   RBC 4.48 3.87 - 5.11 MIL/uL   Hemoglobin 11.1 (L) 12.0 - 15.0 g/dL   HCT 35.2 (L) 36.0 - 46.0 %    MCV 78.6 78.0 - 100.0 fL   MCH 24.8 (L) 26.0 - 34.0 pg   MCHC 31.5 30.0 - 36.0 g/dL   RDW 16.8 (H) 11.5 - 15.5 %   Platelets 421 (H) 150 - 400 K/uL  Imaging Studies: No results found.  Assessment: Patient Active Problem List   Diagnosis Date Noted  . Abnormal uterine bleeding (AUB) 12/07/2017  . Abnormal mammogram of both breasts 11/02/2016  . HYPOTHYROIDISM 06/21/2009  . DM 06/21/2009  . BIPOLAR AFFECTIVE DISORDER 06/21/2009  . 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 INADEQUATE SLEEP HYGIENE 06/21/2009  . OBSTRUCTIVE SLEEP APNEA 06/21/2009  . 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 CARPAL TUNNEL SYNDROME 06/20/2009  . MERALGIA PARESTHETICA 06/20/2009  . HYPERTENSION 06/20/2009  . FIBROMYALGIA 06/20/2009  . CEPHALGIA 06/20/2009    Plan: Patient will undergo surgical management with Hysteroscopy with endometrial ablation using Novasure. Possible fibroid resection. The risks of surgery were discussed in detail with the patient including but not limited to: bleeding which may require transfusion or reoperation; infection which may require antibiotics; injury to surrounding organs which may involve bowel, bladder, ureters ; need for additional procedures including laparoscopy or laparotomy; thromboembolic phenomenon, surgical site problems and other postoperative/anesthesia complications. Likelihood of success in alleviating the patient's condition was discussed. Routine postoperative instructions will be reviewed with the patient and her family in detail after surgery.  The patient concurred with the proposed plan, giving informed written consent for the surgery.  Patient has been NPO since last night she will remain NPO for procedure.  Anesthesia and OR aware.  Preoperative prophylactic antibiotics and SCDs ordered on call to the OR.  To OR when ready.  Money Mckeithan L. Ihor Dow, M.D., [facility] 12/07/2017 2:03 PM

## 2017-12-08 ENCOUNTER — Encounter (HOSPITAL_COMMUNITY): Payer: Self-pay | Admitting: Obstetrics & Gynecology

## 2017-12-09 NOTE — Anesthesia Postprocedure Evaluation (Signed)
Anesthesia Post Note  Patient: Ruth [name]  Procedure(s) Performed: DILATATION & CURETTAGE/HYSTEROSCOPY WITH ATTEMPED NOVASURE ABLATION, FAILED/ENDOMETERIAL ABLATION WITH HTA SYSTEM (N/A Uterus)     Patient location during evaluation: PACU Anesthesia Type: General Level of consciousness: awake and alert Pain management: pain level controlled Vital Signs Assessment: post-procedure vital signs reviewed and stable Respiratory status: spontaneous breathing, nonlabored ventilation, respiratory function stable and patient connected to nasal cannula oxygen Cardiovascular status: blood pressure returned to baseline and stable Postop Assessment: no apparent nausea or vomiting Anesthetic complications: no    Last Vitals:  Vitals:   12/07/17 1645 12/07/17 1700  BP: 124/81 129/84  Pulse: 80   Resp: (!) 25 20  Temp:  36.7 C  SpO2: 94%     Last Pain:  Vitals:   12/07/17 1610  TempSrc:   PainSc: Asleep                 Montez Hageman

## 2018-01-03 ENCOUNTER — Ambulatory Visit: Payer: Medicaid Other | Admitting: Obstetrics & Gynecology

## 2018-01-07 ENCOUNTER — Encounter: Payer: Self-pay | Admitting: Obstetrics & Gynecology

## 2018-01-31 ENCOUNTER — Encounter: Payer: Self-pay | Admitting: Obstetrics & Gynecology

## 2018-01-31 ENCOUNTER — Ambulatory Visit (INDEPENDENT_AMBULATORY_CARE_PROVIDER_SITE_OTHER): Payer: Medicaid Other | Admitting: Obstetrics & Gynecology

## 2018-01-31 VITALS — BP 134/87 | HR 85 | Wt 316.0 lb

## 2018-01-31 DIAGNOSIS — B373 Candidiasis of vulva and vagina: Secondary | ICD-10-CM | POA: Diagnosis not present

## 2018-01-31 DIAGNOSIS — N946 Dysmenorrhea, unspecified: Secondary | ICD-10-CM

## 2018-01-31 DIAGNOSIS — B3731 Acute candidiasis of vulva and vagina: Secondary | ICD-10-CM

## 2018-01-31 MED ORDER — DICLOFENAC POTASSIUM 50 MG PO TABS: 50.0000 mg | ORAL_TABLET | Freq: Three times a day (TID) | ORAL | 3 refills | Status: DC

## 2018-01-31 MED ORDER — CLOTRIMAZOLE 1 % VA CREA: TOPICAL_CREAM | VAGINAL | 1 refills | Status: DC

## 2018-01-31 NOTE — Patient Instructions (Signed)
Dysmenorrhea °Menstrual cramps (dysmenorrhea) are caused by the muscles of the uterus tightening (contracting) during a menstrual period. For some women, this discomfort is merely bothersome. For others, dysmenorrhea can be severe enough to interfere with everyday activities for a few days each month. °Primary dysmenorrhea is menstrual cramps that last a couple of days when you start having menstrual periods or soon after. This often begins after a teenager starts having her period. As a woman gets older or has a baby, the cramps will usually lessen or disappear. Secondary dysmenorrhea begins later in life, lasts longer, and the pain may be stronger than primary dysmenorrhea. The pain may start before the period and last a few days after the period. °What are the causes? °Dysmenorrhea is usually caused by an underlying problem, such as: °· The tissue lining the uterus grows outside of the uterus in other areas of the body (endometriosis). °· The endometrial tissue, which normally lines the uterus, is found in or grows into the muscular walls of the uterus (adenomyosis). °· The pelvic blood vessels are engorged with blood just before the menstrual period (pelvic congestive syndrome). °· Overgrowth of cells (polyps) in the lining of the uterus or cervix. °· Falling down of the uterus (prolapse) because of loose or stretched ligaments. °· Depression. °· Bladder problems, infection, or inflammation. °· Problems with the intestine, a tumor, or irritable bowel syndrome. °· Cancer of the female organs or bladder. °· A severely tipped uterus. °· A very tight opening or closed cervix. °· Noncancerous tumors of the uterus (fibroids). °· Pelvic inflammatory disease (PID). °· Pelvic scarring (adhesions) from a previous surgery. °· Ovarian cyst. °· An intrauterine device (IUD) used for birth control. °What increases the risk? °You may be at greater risk of dysmenorrhea if: °· You are younger than age 30. °· You started puberty  early. °· You have irregular or heavy bleeding. °· You have never given birth. °· You have a family history of this problem. °· You are a smoker. °What are the signs or symptoms? °· Cramping or throbbing pain in your lower abdomen. °· Headaches. °· Lower back pain. °· Nausea or vomiting. °· Diarrhea. °· Sweating or dizziness. °· Loose stools. °How is this diagnosed? °A diagnosis is based on your history, symptoms, physical exam, diagnostic tests, or procedures. Diagnostic tests or procedures may include: °· Blood tests. °· Ultrasonography. °· An examination of the lining of the uterus (dilation and curettage, D&C). °· An examination inside your abdomen or pelvis with a scope (laparoscopy). °· X-rays. °· CT scan. °· MRI. °· An examination inside the bladder with a scope (cystoscopy). °· An examination inside the intestine or stomach with a scope (colonoscopy, gastroscopy). °How is this treated? °Treatment depends on the cause of the dysmenorrhea. Treatment may include: °· Pain medicine prescribed by your health care provider. °· Birth control pills or an IUD with progesterone hormone in it. °· Hormone replacement therapy. °· Nonsteroidal anti-inflammatory drugs (NSAIDs). These may help stop the production of prostaglandins. °· Surgery to remove adhesions, endometriosis, ovarian cyst, or fibroids. °· Removal of the uterus (hysterectomy). °· Progesterone shots to stop the menstrual period. °· Cutting the nerves on the sacrum that go to the female organs (presacral neurectomy). °· Electric current to the sacral nerves (sacral nerve stimulation). °· Antidepressant medicine. °· Psychiatric therapy, counseling, or group therapy. °· Exercise and physical therapy. °· Meditation and yoga therapy. °· Acupuncture. °Follow these instructions at home: °· Only take over-the-counter or prescription medicines as directed   by your health care provider. °· Place a heating pad or hot water bottle on your lower back or abdomen. Do not  sleep with the heating pad. °· Use aerobic exercises, walking, swimming, biking, and other exercises to help lessen the cramping. °· Massage to the lower back or abdomen may help. °· Stop smoking. °· Avoid alcohol and caffeine. °Contact a health care provider if: °· Your pain does not get better with medicine. °· You have pain with sexual intercourse. °· Your pain increases and is not controlled with medicines. °· You have abnormal vaginal bleeding with your period. °· You develop nausea or vomiting with your period that is not controlled with medicine. °Get help right away if: °You pass out. °This information is not intended to replace advice given to you by your health care provider. Make sure you discuss any questions you have with your health care provider. °Document Released: 06/22/2005 Document Revised: 11/28/2015 Document Reviewed: 12/08/2012 °Elsevier Interactive Patient Education © 2017 [employer] ° °

## 2018-01-31 NOTE — Progress Notes (Signed)
History:  42 y.o. [id number] here today for her 8 weeks post op check after an endometrial ablation. The pt reports that she has had only a few days of spotting since the procedure. She does reports that she has had more cramping since the procedure. She takes Motrin with some resolution of the pain.        Pt reports yeast infection of her upper thighs.      The following portions of the patient's history were reviewed and updated as appropriate: allergies, current medications, past family history, past medical history, past social history, past surgical history and problem list.  Review of Systems:  Pertinent items are noted in HPI.    Objective:  Physical Exam Blood pressure 134/87, pulse 85, weight (!) 316 lb (143.3 kg).  CONSTITUTIONAL: Well-developed, well-nourished female in no acute distress.  HENT:  Normocephalic, atraumatic EYES: Conjunctivae and EOM are normal. No scleral icterus.  NECK: Normal range of motion SKIN: Skin is warm and dry. No rash noted. Not diaphoretic.No pallor. [place]: Alert and oriented to person, place, and time. Normal coordination.   Labs and Imaging 12/07/2017 Diagnosis Endometrium, curettage - INACTIVE ENDOMETRIUM WITH PROGESTATIONAL CHANGES. 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 - NO HYPERPLASIA OR CARCINOMA.  Assessment & Plan:  AUB and dysmenorrhea- her sx are improved but, the cramping is bothersome to her.   Cataflam 50mg  tid prn dysmenorrhea  Stop the Motrin  Yeast vulvovaginitis  Clotrimazole bid until clear.    Montrelle Eddings L. Harraway-Smith, M.D., [name]

## 2018-02-04 ENCOUNTER — Ambulatory Visit (HOSPITAL_COMMUNITY)
Admission: EM | Admit: 2018-02-04 | Discharge: 2018-02-04 | Disposition: A | Discharge status: Home / Self Care | Payer: Medicaid Other | Attending: Internal Medicine | Admitting: Internal Medicine

## 2018-02-04 ENCOUNTER — Other Ambulatory Visit: Payer: Self-pay

## 2018-02-04 ENCOUNTER — Encounter (HOSPITAL_COMMUNITY): Payer: Self-pay

## 2018-02-04 DIAGNOSIS — M25531 Pain in right wrist: Secondary | ICD-10-CM

## 2018-02-04 MED ORDER — PREDNISONE 50 MG PO TABS: 50.0000 mg | ORAL_TABLET | Freq: Every day | ORAL | 0 refills | Status: AC

## 2018-02-04 MED ORDER — DICLOFENAC SODIUM 1 % TD GEL: 2.0000 g | Freq: Four times a day (QID) | TRANSDERMAL | 0 refills | Status: DC

## 2018-02-04 NOTE — Discharge Instructions (Addendum)
As discussed, history and exam most consistent with inflammation to the tendons.  Continue ibuprofen 800 mg 3 times a day.  Prednisone as directed.  You can use Voltaren gel on affected area for further pain relief.  Ice compress, elevation, wrist splint to help with pain.  Follow-up with orthopedics for further evaluation if symptoms not improving.  I have attached to on-call orthopedics information.  You can also follow-up with her old orthopedics for further evaluation needed.

## 2018-02-04 NOTE — ED Triage Notes (Signed)
Right arm pain and hand. This started last night

## 2018-02-04 NOTE — ED Provider Notes (Signed)
[facility]    CSN: [id number] Arrival date & time: 02/04/18  1418     History   Chief Complaint Chief Complaint  Patient presents with  . Arm Pain    HPI Ruth [name] is a 42 y.o. female.   42 year old female comes in for 1 day history of right wrist and arm pain.  States was at rest when symptoms first started,  most tender at the radial aspect of the wrist.  States now having decreased range of motion due to pain and some swelling.  Denies erythema, increased warmth.  Denies fever, chills, night sweats.  States pain can radiate up the arm, especially  while trying to keep her wrist stable.  Numbness and tingling to the first and second finger.  Took ibuprofen 800 mg without relief.     Past Medical History:  Diagnosis Date  . Anemia   . Arthritis    chronic back pain   . Asthma   . Bipolar affective disorder ([place])   . Bronchitis   . Diabetes mellitus    Type 2  . Fibromyalgia   . Hx of migraines    occ  . Hypertension   . Obesity   . Thyroid disease     Patient Active Problem List   Diagnosis Date Noted  . Abnormal uterine bleeding (AUB) 12/07/2017  . Abnormal mammogram of both breasts 11/02/2016  . HYPOTHYROIDISM 06/21/2009  . DM 06/21/2009  . BIPOLAR AFFECTIVE DISORDER 06/21/2009  . 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 INADEQUATE SLEEP HYGIENE 06/21/2009  . OBSTRUCTIVE SLEEP APNEA 06/21/2009  . 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 CARPAL TUNNEL SYNDROME 06/20/2009  . MERALGIA PARESTHETICA 06/20/2009  . HYPERTENSION 06/20/2009  . FIBROMYALGIA 06/20/2009  . CEPHALGIA 06/20/2009    Past Surgical History:  Procedure Laterality Date  . CARPAL TUNNEL RELEASE Right   . CESAREAN SECTION     x2  . DILITATION & CURRETTAGE/HYSTROSCOPY WITH NOVASURE ABLATION N/A 12/07/2017   Procedure: DILATATION & CURETTAGE/HYSTEROSCOPY WITH ATTEMPED NOVASURE ABLATION, FAILED/ENDOMETERIAL ABLATION WITH HTA SYSTEM;  Surgeon: Lavonia Drafts, MD;  Location: [facility];  Service: Gynecology;  Laterality: N/A;  . ELBOW SURGERY     "surgery for nerve pain"  . GANGLION CYST EXCISION Right    right arm   . TONSILLECTOMY     childhood  . TUBAL LIGATION    . VAGINA SURGERY     fistula    OB History    Gravida  7   Para  5   Term  4   Preterm  1   AB  2   Living  5     SAB  2   TAB      Ectopic      Multiple      Live Births  4            Home Medications    Prior to Admission medications   Medication Sig Start Date End Date Taking? Authorizing Provider  albuterol (PROVENTIL HFA;VENTOLIN HFA) 108 (90 Base) MCG/ACT inhaler Inhale 1-2 puffs into the lungs every 6 (six) hours as needed for wheezing. 05/03/16   [provider]  amLODipine (NORVASC) 5 MG tablet Take 5 mg by mouth daily. 08/01/17   [provider]  BELBUCA 75 MCG FILM Take 1 Film by mouth every 12 (twelve) hours.  10/15/17   [provider]  cetirizine (ZYRTEC) 10 MG tablet Take 10 mg by mouth daily.    [provider]  clotrimazole (GYNE-LOTRIMIN) 1 % vaginal cream Apply to affected area bid until clear 01/31/18   Lavonia Drafts, MD  diclofenac (CATAFLAM) 50 MG tablet Take 1 tablet (50 mg total) by mouth 3 (three) times daily. 01/31/18   Lavonia Drafts, MD  diclofenac sodium (VOLTAREN) 1 % GEL Apply 2 g topically 4 (four) times daily. 02/04/18   Tasia Catchings, Joson Sapp V, PA-C  fluticasone (FLONASE) 50 MCG/ACT nasal spray Place 2 sprays into the nose daily as needed for allergies. 11/08/16   [provider]  ibuprofen (ADVIL,MOTRIN) 600 MG tablet Take 1 tablet (600 mg total) by mouth every 6 (six) hours as needed. 12/07/17   Lavonia Drafts, MD  ibuprofen (ADVIL,MOTRIN) 800 MG tablet Take 800 mg by mouth every 8 (eight) hours as needed for moderate pain.    [provider]  metFORMIN (GLUCOPHAGE-XR) 500 MG 24 hr tablet Take 500 mg by mouth 2 (two) times daily. 08/01/17   [provider]  predniSONE (DELTASONE) 50 MG tablet Take 1 tablet (50 mg total) by mouth daily for 5 days. 02/04/18 02/09/18  Tasia Catchings, Latesha Chesney V, PA-C  rosuvastatin (CRESTOR) 10 MG tablet Take 10 mg by mouth daily. 08/01/17   [provider]  triamterene-hydrochlorothiazide (MAXZIDE-25) 37.5-25 MG tablet Take 1 tablet by mouth daily. 08/01/17   [provider]    Family History Family History  Problem Relation Age of Onset  . Healthy Mother   . Healthy Father   . Breast cancer Neg Hx     Social History Social History   Tobacco Use  . Smoking status: Current Every Day Smoker    Packs/day: 0.25    Types: Cigarettes  . Smokeless tobacco: Never Used  Substance Use Topics  . Alcohol use: No  . Drug use: Yes    Types: Marijuana    Comment: occasional     Allergies   Penicillins   Review of Systems Review of Systems  Reason unable to perform ROS: See HPI as above.     Physical Exam Triage Vital Signs ED Triage Vitals  Enc Vitals Group     BP 02/04/18 1451 (!) 143/80     Pulse Rate 02/04/18 1451 79     Resp --      Temp 02/04/18 1448 97.6 F (36.4 C)     Temp src --      SpO2 02/04/18 1451 100 %     Weight 02/04/18 1452 (!) 310 lb (140.6 kg)     Height --      Head Circumference --      Peak Flow --      Pain Score 02/04/18 1452 10     Pain Loc --      Pain Edu? --      Excl. in [place]? --    No data found.  Updated Vital Signs BP (!) 143/80   Pulse 79   Temp 97.6 F (36.4 C)   Wt (!) 310 lb (140.6 kg)   SpO2 100%   BMI 52.39 kg/m   Physical Exam  Constitutional: She is oriented to person, place, and time. She appears well-developed and well-nourished. No distress.  HENT:  Head: Normocephalic and atraumatic.  Eyes: Pupils are equal, round, and reactive to light. Conjunctivae are normal.  Musculoskeletal:  No obvious swelling, erythema, increased warmth, contusion seen.  Patient with diffuse tenderness to light touch of the radial aspect of the  wrist and ventral aspect of the wrist.  No tenderness to palpation  of the MCPs and digits.  No tenderness to palpation of the forearm.  Full range of motion of elbow, strength normal and equal bilaterally.  Decreased range of motion of wrist, first and second fingers.  Strength deferred.  Sensation intact, decreased on first and second finger.  Radial pulse 2+ and equal bilaterally.  Cap refill less than 2 seconds.  Unable to assess Phalen's and Tinel's due to patient's pain and limited range of motion.  Positive Finkelstein's.  Neurological: She is alert and oriented to person, place, and time.  Skin: She is not diaphoretic.   UC Treatments / Results  Labs (all labs ordered are listed, but only abnormal results are displayed) Labs Reviewed - No data to display  EKG None  Radiology No results found.  Procedures Procedures (including critical care time)  Medications Ordered in UC Medications - No data to display  Initial Impression / Assessment and Plan / UC Course  I have reviewed the triage vital signs and the nursing notes.  Pertinent labs & imaging results that were available during my care of the patient were reviewed by me and considered in my medical decision making (see chart for details).    Will treat for possible de Quervain's.  Mobic as directed.  Patient currently taking ibuprofen for cramping, can continue.  Voltaren as needed.  Ice compress, elevation, wrist splint.  Patient to follow-up with orthopedics for further evaluation.  Return precautions given.  Patient expresses understanding and agrees to plan.  Final Clinical Impressions(s) / UC Diagnoses   Final diagnoses:  Right wrist pain    ED Prescriptions    Medication Sig Dispense Auth. Provider   predniSONE (DELTASONE) 50 MG tablet Take 1 tablet (50 mg total) by mouth daily for 5 days. 5 tablet Calyb Mcquarrie V, PA-C   diclofenac sodium (VOLTAREN) 1 % GEL Apply 2 g topically 4 (four) times daily. 1 Tube Tobin Chad, PA-C 02/04/18 1547

## 2018-02-18 ENCOUNTER — Encounter (HOSPITAL_COMMUNITY): Payer: Self-pay

## 2018-02-18 ENCOUNTER — Ambulatory Visit (HOSPITAL_COMMUNITY)
Admission: EM | Admit: 2018-02-18 | Discharge: 2018-02-18 | Disposition: A | Discharge status: Home / Self Care | Payer: Medicaid Other | Attending: Emergency Medicine | Admitting: Emergency Medicine

## 2018-02-18 DIAGNOSIS — R519 Headache, unspecified: Secondary | ICD-10-CM

## 2018-02-18 DIAGNOSIS — J069 Acute upper respiratory infection, unspecified: Secondary | ICD-10-CM | POA: Diagnosis not present

## 2018-02-18 DIAGNOSIS — B9789 Other viral agents as the cause of diseases classified elsewhere: Secondary | ICD-10-CM

## 2018-02-18 DIAGNOSIS — R51 Headache: Secondary | ICD-10-CM

## 2018-02-18 MED ORDER — CETIRIZINE-PSEUDOEPHEDRINE ER 5-120 MG PO TB12: 1.0000 | ORAL_TABLET | Freq: Every day | ORAL | 0 refills | Status: DC

## 2018-02-18 MED ORDER — FLUTICASONE PROPIONATE 50 MCG/ACT NA SUSP: 2.0000 | Freq: Every day | NASAL | 2 refills | Status: DC

## 2018-02-18 MED ORDER — OXYMETAZOLINE HCL 0.05 % NA SOLN: 1.0000 | Freq: Two times a day (BID) | NASAL | 0 refills | Status: DC

## 2018-02-18 NOTE — ED Triage Notes (Signed)
Pt presents with upper respiratory symptoms; coughing, pain in both ears, congestion and drainage.

## 2018-02-18 NOTE — ED Provider Notes (Signed)
[facility]   [id number] 02/18/18 Arrival Time: 1005  CC: SINUS PAIN  SUBJECTIVE: History from: patient.  Ruth [name] is a 42 y.o. female who presents with abrupt onset of ear pressure, nasal congestion, sinus pain and pressure, rhinorrhea, and dry cough that began 1 day ago.  Denies positive sick exposure or precipitating event.  Has tried ibuprofen 800mg  without relief.  Symptoms are made worse with with lying down at night.  Reports previous symptoms in the past.   Complains of associated fatigue.  Denies fever, chills, sore throat, SOB, wheezing, chest pain, nausea, changes in bowel or bladder habits.    ROS: As per HPI.  Past Medical History:  Diagnosis Date  . Anemia   . Arthritis    chronic back pain   . Asthma   . Bipolar affective disorder ([name])   . Bronchitis   . Diabetes mellitus    Type 2  . Fibromyalgia   . Hx of migraines    occ  . Hypertension   . Obesity   . Thyroid disease    Past Surgical History:  Procedure Laterality Date  . CARPAL TUNNEL RELEASE Right   . CESAREAN SECTION     x2  . DILITATION & CURRETTAGE/HYSTROSCOPY WITH NOVASURE ABLATION N/A 12/07/2017   Procedure: DILATATION & CURETTAGE/HYSTEROSCOPY WITH ATTEMPED NOVASURE ABLATION, FAILED/ENDOMETERIAL ABLATION WITH HTA SYSTEM;  Surgeon: Lavonia Drafts, MD;  Location: [facility];  Service: Gynecology;  Laterality: N/A;  . ELBOW SURGERY     "surgery for nerve pain"  . GANGLION CYST EXCISION Right    right arm   . TONSILLECTOMY     childhood  . TUBAL LIGATION    . VAGINA SURGERY     fistula   Allergies  Allergen Reactions  . Penicillins Other (See Comments)    Unknown; from childhood Has patient had a PCN reaction causing immediate rash, facial/tongue/throat swelling, SOB or lightheadedness with hypotension: Unknow Has patient had a PCN reaction causing severe rash involving mucus membranes or skin necrosis: Unknown Has patient had a PCN reaction that required  hospitalization: Unknown Has patient had a PCN reaction occurring within the last 10 years: Unknown If all of the above answers are "NO", then may proceed with Cephalosporin use.    No current facility-administered medications on file prior to encounter.    Current Outpatient Medications on File Prior to Encounter  Medication Sig Dispense Refill  . albuterol (PROVENTIL HFA;VENTOLIN HFA) 108 (90 Base) MCG/ACT inhaler Inhale 1-2 puffs into the lungs every 6 (six) hours as needed for wheezing.    Marland Kitchen amLODipine (NORVASC) 5 MG tablet Take 5 mg by mouth daily.  3  . BELBUCA 75 MCG FILM Take 1 Film by mouth every 12 (twelve) hours.   0  . cetirizine (ZYRTEC) 10 MG tablet Take 10 mg by mouth daily.    . clotrimazole (GYNE-LOTRIMIN) 1 % vaginal cream Apply to affected area bid until clear 120 g 1  . diclofenac (CATAFLAM) 50 MG tablet Take 1 tablet (50 mg total) by mouth 3 (three) times daily. 40 tablet 3  . diclofenac sodium (VOLTAREN) 1 % GEL Apply 2 g topically 4 (four) times daily. 1 Tube 0  . ibuprofen (ADVIL,MOTRIN) 600 MG tablet Take 1 tablet (600 mg total) by mouth every 6 (six) hours as needed. 30 tablet 0  . ibuprofen (ADVIL,MOTRIN) 800 MG tablet Take 800 mg by mouth every 8 (eight) hours as needed for moderate pain.    . metFORMIN (GLUCOPHAGE-XR) 500  MG 24 hr tablet Take 500 mg by mouth 2 (two) times daily.  2  . rosuvastatin (CRESTOR) 10 MG tablet Take 10 mg by mouth daily.  1  . triamterene-hydrochlorothiazide (MAXZIDE-25) 37.5-25 MG tablet Take 1 tablet by mouth daily.  3   Social History   Socioeconomic History  . Marital status: Married    Spouse name: Not on file  . Number of children: Not on file  . Years of education: Not on file  . Highest education level: Not on file  Occupational History  . Not on file  Social Needs  . Financial resource strain: Not on file  . Food insecurity:    Worry: Not on file    Inability: Not on file  . Transportation needs:    Medical: Not on  file    Non-medical: Not on file  Tobacco Use  . Smoking status: Current Every Day Smoker    Packs/day: 0.25    Types: Cigarettes  . Smokeless tobacco: Never Used  Substance and Sexual Activity  . Alcohol use: No  . Drug use: Yes    Types: Marijuana    Comment: occasional  . Sexual activity: Yes    Birth control/protection: Surgical  Lifestyle  . Physical activity:    Days per week: Not on file    Minutes per session: Not on file  . Stress: Not on file  Relationships  . Social connections:    Talks on phone: Not on file    Gets together: Not on file    Attends religious service: Not on file    Active member of club or organization: Not on file    Attends meetings of clubs or organizations: Not on file    Relationship status: Not on file  . Intimate partner violence:    Fear of current or ex partner: Not on file    Emotionally abused: Not on file    Physically abused: Not on file    Forced sexual activity: Not on file  Other Topics Concern  . Not on file  Social History Narrative  . Not on file   Family History  Problem Relation Age of Onset  . Healthy Mother   . Healthy Father   . Breast cancer Neg Hx     OBJECTIVE:  Vitals:   02/18/18 1045  BP: (!) 152/96  Pulse: 93  Resp: 20  Temp: (!) 97.3 F (36.3 C)  TempSrc: Oral  SpO2: 98%     General appearance: alert; appears fatigued HEENT: Ears: EACs clear, TMs pearly gray with visible cone of light, without erythema; Eyes: PERRL, EOMI grossly; frontal and maxillary sinus tenderness; Nose: nares appear erythematous with clear rhinorrhea; Throat: oropharynx mildly erythematous, tonsils 1+ without white tonsillar exudates, uvula midline Neck: supple without LAD Lungs: unlabored respirations, symmetrical air entry; cough: absent; no respiratory distress; CTAB Heart: regular rate and rhythm.  Radial pulses 2+ symmetrical bilaterally Skin: warm and dry Psychological: alert and cooperative; normal mood and  affect  ASSESSMENT & PLAN:  1. Viral URI with cough   2. Sinus headache     Meds ordered this encounter  Medications  . cetirizine-pseudoephedrine (ZYRTEC-D) 5-120 MG tablet    Sig: Take 1 tablet by mouth daily.    Dispense:  30 tablet    Refill:  0    Order Specific Question:   Supervising Provider    Answer:   Wynona Luna [phone]  . fluticasone (FLONASE) 50 MCG/ACT nasal spray  Sig: Place 2 sprays into both nostrils daily.    Dispense:  15.8 g    Refill:  2    Order Specific Question:   Supervising Provider    Answer:   Wynona Luna [[id number]]  . oxymetazoline (AFRIN) 0.05 % nasal spray    Sig: Place 1 spray into both nostrils 2 (two) times daily.    Dispense:  30 mL    Refill:  0    Order Specific Question:   Supervising Provider    Answer:   Wynona Luna [[id number]]    Get plenty of rest and push fluids Zyrtec-D prescribed take as directed Flonase prescribed.  Use daily for symptomatic relief Afrin prescribed.  USE ONLY FOR 3 DAYS.  If you use this medication longer than 3 days you may experience a rebound effect and have worsening symptoms.   Continue OTC medication as needed for symptomatic relief PCP assistance initiated Please follow up with PCP or with [facility] if symptoms persists Return or go to ER if you have any new or worsening symptoms   Blood pressure elevated in office.  Please recheck in 24 hours.  If it continues to be greater than 140/90 please follow up with PCP for further evaluation and management.   Reviewed expectations re: course of current medical issues. Questions answered. Outlined signs and symptoms indicating need for more acute intervention. Patient verbalized understanding. After Visit Summary given.         Lestine Box, PA-C 02/18/18 1113

## 2018-02-18 NOTE — Discharge Instructions (Addendum)
Get plenty of rest and push fluids Zyrtec-D prescribed take as directed Flonase prescribed.  Use daily for symptomatic relief Afrin prescribed.  USE ONLY FOR 3 DAYS.  If you use this medication longer than 3 days you may experience a rebound effect and have worsening symptoms.   Continue OTC medication as needed for symptomatic relief PCP assistance initiated Please follow up with PCP or with [facility] if symptoms persists Follow up with PCP if symptoms persist Return or go to ER if you have any new or worsening symptoms   Blood pressure elevated in office.  Please recheck in 24 hours.  If it continues to be greater than 140/90 please follow up with PCP for further evaluation and management.

## 2018-03-27 ENCOUNTER — Ambulatory Visit (HOSPITAL_COMMUNITY)
Admission: EM | Admit: 2018-03-27 | Discharge: 2018-03-27 | Disposition: A | Discharge status: Home / Self Care | Payer: Medicaid Other | Attending: Internal Medicine | Admitting: Internal Medicine

## 2018-03-27 ENCOUNTER — Encounter (HOSPITAL_COMMUNITY): Payer: Self-pay

## 2018-03-27 DIAGNOSIS — J069 Acute upper respiratory infection, unspecified: Secondary | ICD-10-CM

## 2018-03-27 DIAGNOSIS — B9789 Other viral agents as the cause of diseases classified elsewhere: Secondary | ICD-10-CM | POA: Diagnosis not present

## 2018-03-27 MED ORDER — BENZONATATE 200 MG PO CAPS: 200.0000 mg | ORAL_CAPSULE | Freq: Three times a day (TID) | ORAL | 0 refills | Status: AC | PRN

## 2018-03-27 MED ORDER — DOXYCYCLINE HYCLATE 100 MG PO CAPS: 100.0000 mg | ORAL_CAPSULE | Freq: Two times a day (BID) | ORAL | 0 refills | Status: AC

## 2018-03-27 MED ORDER — HYDROCODONE-HOMATROPINE 5-1.5 MG/5ML PO SYRP: 5.0000 mL | ORAL_SOLUTION | Freq: Four times a day (QID) | ORAL | 0 refills | Status: DC | PRN

## 2018-03-27 MED ORDER — FLUTICASONE PROPIONATE 50 MCG/ACT NA SUSP: 1.0000 | Freq: Every day | NASAL | 0 refills | Status: DC

## 2018-03-27 NOTE — ED Triage Notes (Signed)
Pt presents with persistent cough and congestion that is unrelieved by OTC medication and nasal spray.

## 2018-03-27 NOTE — Discharge Instructions (Signed)
Please continue Zyrtec or Zyrtec-D, and a pair with Flonase nasal spray 1 to 2 sprays in each nostril For cough please use Tessalon during the day, may use Hycodan cough syrup at nighttime- do not use during the day or drive as this may cause sleepiness May fill prescription for doxycycline on Wednesday if not having any improvement by then I expect symptoms to persist for another 3 to 4 days followed by gradual improvement.,  Please follow-up if symptoms persisting, not improving or worsening.  Please follow-up if developing worsening shortness of breath, fever, chest discomfort.

## 2018-03-28 NOTE — ED Provider Notes (Signed)
[place]    CSN: [id number] Arrival date & time: 03/27/18  1050     History   Chief Complaint Chief Complaint  Patient presents with  . Cough  . Congestion    HPI Ruth [name] is a 42 y.o. female history of hypertension, DM type II, bipolar, asthma presenting today for evaluation of URI symptoms.  Patient states that she has had a nonproductive cough and congestion.  Symptoms began Wednesday and have been persisting for the past 5 days.  She denies sore throat.  Denies chest discomfort or shortness of breath.  Has tried Hall's, Zyrtec-D, Afrin and Pertussin with mild relief.  She denies fevers.  Denies worsening of her asthma, denies wheezing.  Patient's main concern is her cough especially at nighttime.  She has had difficulty sleeping due to the cough.  HPI  Past Medical History:  Diagnosis Date  . Anemia   . Arthritis    chronic back pain   . Asthma   . Bipolar affective disorder ([place])   . Bronchitis   . Diabetes mellitus    Type 2  . Fibromyalgia   . Hx of migraines    occ  . Hypertension   . Obesity   . Thyroid disease     Patient Active Problem List   Diagnosis Date Noted  . Abnormal uterine bleeding (AUB) 12/07/2017  . Abnormal mammogram of both breasts 11/02/2016  . HYPOTHYROIDISM 06/21/2009  . DM 06/21/2009  . BIPOLAR AFFECTIVE DISORDER 06/21/2009  . 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 INADEQUATE SLEEP HYGIENE 06/21/2009  . OBSTRUCTIVE SLEEP APNEA 06/21/2009  . 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 CARPAL TUNNEL SYNDROME 06/20/2009  . MERALGIA PARESTHETICA 06/20/2009  . HYPERTENSION 06/20/2009  . FIBROMYALGIA 06/20/2009  . CEPHALGIA 06/20/2009    Past Surgical History:  Procedure Laterality Date  . CARPAL TUNNEL RELEASE Right   . CESAREAN SECTION     x2  . DILITATION & CURRETTAGE/HYSTROSCOPY WITH NOVASURE ABLATION N/A 12/07/2017   Procedure: DILATATION & CURETTAGE/HYSTEROSCOPY WITH ATTEMPED NOVASURE  ABLATION, FAILED/ENDOMETERIAL ABLATION WITH HTA SYSTEM;  Surgeon: Lavonia Drafts, MD;  Location: [facility];  Service: Gynecology;  Laterality: N/A;  . ELBOW SURGERY     "surgery for nerve pain"  . GANGLION CYST EXCISION Right    right arm   . TONSILLECTOMY     childhood  . TUBAL LIGATION    . VAGINA SURGERY     fistula    OB History    Gravida  7   Para  5   Term  4   Preterm  1   AB  2   Living  5     SAB  2   TAB      Ectopic      Multiple      Live Births  4            Home Medications    Prior to Admission medications   Medication Sig Start Date End Date Taking? Authorizing Provider  albuterol (PROVENTIL HFA;VENTOLIN HFA) 108 (90 Base) MCG/ACT inhaler Inhale 1-2 puffs into the lungs every 6 (six) hours as needed for wheezing. 05/03/16   [provider]  amLODipine (NORVASC) 5 MG tablet Take 5 mg by mouth daily. 08/01/17   [provider]  BELBUCA 75 MCG FILM Take 1 Film by mouth every 12 (twelve) hours.  10/15/17   [provider]  benzonatate (TESSALON) 200 MG capsule Take 1 capsule (200 mg total) by mouth  3 (three) times daily as needed for up to 7 days for cough. 03/27/18 04/03/18  Lakethia Coppess C, PA-C  cetirizine (ZYRTEC) 10 MG tablet Take 10 mg by mouth daily.    [provider]  cetirizine-pseudoephedrine (ZYRTEC-D) 5-120 MG tablet Take 1 tablet by mouth daily. 02/18/18   Wurst, [place], PA-C  clotrimazole (GYNE-LOTRIMIN) 1 % vaginal cream Apply to affected area bid until clear 01/31/18   Lavonia Drafts, MD  diclofenac (CATAFLAM) 50 MG tablet Take 1 tablet (50 mg total) by mouth 3 (three) times daily. 01/31/18   Lavonia Drafts, MD  diclofenac sodium (VOLTAREN) 1 % GEL Apply 2 g topically 4 (four) times daily. 02/04/18   Tasia Catchings, Amy V, PA-C  doxycycline (VIBRAMYCIN) 100 MG capsule Take 1 capsule (100 mg total) by mouth 2 (two) times daily for 10 days. 03/27/18 04/06/18  Sharea Guinther C, PA-C    fluticasone (FLONASE) 50 MCG/ACT nasal spray Place 1-2 sprays into both nostrils daily for 7 days. 03/27/18 04/03/18  Thales Knipple C, PA-C  HYDROcodone-homatropine (HYCODAN) 5-1.5 MG/5ML syrup Take 5 mLs by mouth every 6 (six) hours as needed for cough. 03/27/18   Jacci Ruberg C, PA-C  ibuprofen (ADVIL,MOTRIN) 600 MG tablet Take 1 tablet (600 mg total) by mouth every 6 (six) hours as needed. 12/07/17   Lavonia Drafts, MD  ibuprofen (ADVIL,MOTRIN) 800 MG tablet Take 800 mg by mouth every 8 (eight) hours as needed for moderate pain.    [provider]  metFORMIN (GLUCOPHAGE-XR) 500 MG 24 hr tablet Take 500 mg by mouth 2 (two) times daily. 08/01/17   [provider]  oxymetazoline (AFRIN) 0.05 % nasal spray Place 1 spray into both nostrils 2 (two) times daily. 02/18/18   Wurst, [place], PA-C  rosuvastatin (CRESTOR) 10 MG tablet Take 10 mg by mouth daily. 08/01/17   [provider]  triamterene-hydrochlorothiazide (MAXZIDE-25) 37.5-25 MG tablet Take 1 tablet by mouth daily. 08/01/17   [provider]    Family History Family History  Problem Relation Age of Onset  . Healthy Mother   . Healthy Father   . Breast cancer Neg Hx     Social History Social History   Tobacco Use  . Smoking status: Current Every Day Smoker    Packs/day: 0.25    Types: Cigarettes  . Smokeless tobacco: Never Used  Substance Use Topics  . Alcohol use: No  . Drug use: Yes    Types: Marijuana    Comment: occasional     Allergies   Penicillins   Review of Systems Review of Systems  Constitutional: Negative for activity change, appetite change, chills, fatigue and fever.  HENT: Positive for congestion and rhinorrhea. Negative for ear pain, sinus pressure, sore throat and trouble swallowing.   Eyes: Negative for discharge and redness.  Respiratory: Positive for cough. Negative for chest tightness, shortness of breath and wheezing.   Cardiovascular: Negative for  chest pain.  Gastrointestinal: Negative for abdominal pain, diarrhea, nausea and vomiting.  Musculoskeletal: Negative for myalgias.  Skin: Negative for rash.  Neurological: Negative for dizziness, light-headedness and headaches.     Physical Exam Triage Vital Signs ED Triage Vitals [03/27/18 1149]  Enc Vitals Group     BP (!) 148/95     Pulse Rate 100     Resp 20     Temp 97.9 F (36.6 C)     Temp Source Oral     SpO2 100 %     Weight  Height      Head Circumference      Peak Flow      Pain Score      Pain Loc      Pain Edu?      Excl. in [place]?    No data found.  Updated Vital Signs BP (!) 148/95 (BP Location: Left Arm)   Pulse 100   Temp 97.9 F (36.6 C) (Oral)   Resp 20   SpO2 100%   Visual Acuity Right Eye Distance:   Left Eye Distance:   Bilateral Distance:    Right Eye Near:   Left Eye Near:    Bilateral Near:     Physical Exam  Constitutional: She appears well-developed and well-nourished. No distress.  HENT:  Head: Normocephalic and atraumatic.  Bilateral ears without tenderness to palpation of external auricle, tragus and mastoid, EAC's without erythema or swelling, TM's with good bony landmarks and cone of light. Non erythematous.  Oral mucosa pink and moist, no tonsillar enlargement or exudate. Posterior pharynx patent and erythematous, no uvula deviation or swelling. Normal phonation.  Eyes: Conjunctivae are normal.  Neck: Neck supple.  Cardiovascular: Normal rate and regular rhythm.  No murmur heard. Pulmonary/Chest: Effort normal and breath sounds normal. No respiratory distress.  Breathing comfortably at rest, CTABL, no wheezing, rales or other adventitious sounds auscultated  Abdominal: Soft. There is no tenderness.  Musculoskeletal: She exhibits no edema.  Neurological: She is alert.  Skin: Skin is warm and dry.  Psychiatric: She has a normal mood and affect.  Nursing note and vitals reviewed.    UC Treatments / Results   Labs (all labs ordered are listed, but only abnormal results are displayed) Labs Reviewed - No data to display  EKG None  Radiology No results found.  Procedures Procedures (including critical care time)  Medications Ordered in UC Medications - No data to display  Initial Impression / Assessment and Plan / UC Course  I have reviewed the triage vital signs and the nursing notes.  Pertinent labs & imaging results that were available during my care of the patient were reviewed by me and considered in my medical decision making (see chart for details).     Patient with URI symptoms, vital signs stable, exam unremarkable, likely viral etiology.  Will recommend to continue symptomatic management.  Would expect self resolution at approximately 7 to 10 days.  They continue Zyrtec-D or so she is Zyrtec and Flonase.  Discussed discontinuing Afrin as she has been using this for approximately 4 days.  Discussed rebound congestion regarding Afrin.  For cough provided Tessalon to use during the day, Hycodan to use at nighttime.  Discussed sedation regarding this and advised to only use at bedtime and not to use while working, or driving.  Provided patient with printed prescription for patient to fill in 3 to 4 days if not having any improvement with continued symptomatic treatment.  Follow-up if symptoms not improving or worsening, developing fever, short shortness of breath, chest discomfort.Discussed strict return precautions. Patient verbalized understanding and is agreeable with plan.  Final Clinical Impressions(s) / UC Diagnoses   Final diagnoses:  Viral URI with cough     Discharge Instructions     Please continue Zyrtec or Zyrtec-D, and a pair with Flonase nasal spray 1 to 2 sprays in each nostril For cough please use Tessalon during the day, may use Hycodan cough syrup at nighttime- do not use during the day or drive as this may cause  sleepiness May fill prescription for doxycycline  on Wednesday if not having any improvement by then I expect symptoms to persist for another 3 to 4 days followed by gradual improvement.,  Please follow-up if symptoms persisting, not improving or worsening.  Please follow-up if developing worsening shortness of breath, fever, chest discomfort.   ED Prescriptions    Medication Sig Dispense Auth. Provider   HYDROcodone-homatropine (HYCODAN) 5-1.5 MG/5ML syrup Take 5 mLs by mouth every 6 (six) hours as needed for cough. 75 mL Quintavious Rinck C, PA-C   benzonatate (TESSALON) 200 MG capsule Take 1 capsule (200 mg total) by mouth 3 (three) times daily as needed for up to 7 days for cough. 28 capsule Elisabetta Mishra C, PA-C   fluticasone (FLONASE) 50 MCG/ACT nasal spray Place 1-2 sprays into both nostrils daily for 7 days. 1 g Giovany Cosby C, PA-C   doxycycline (VIBRAMYCIN) 100 MG capsule Take 1 capsule (100 mg total) by mouth 2 (two) times daily for 10 days. 20 capsule Kaelie Henigan C, PA-C     Controlled Substance Prescriptions [place] Controlled Substance Registry consulted? Yes, I have consulted the [place] Controlled Substances Registry for this patient, and feel the risk/benefit ratio today is favorable for proceeding with this prescription for a controlled substance.   Janith Lima, [place] 03/28/18 [phone]

## 2018-04-12 ENCOUNTER — Encounter: Payer: Self-pay | Admitting: Obstetrics & Gynecology

## 2018-04-12 ENCOUNTER — Ambulatory Visit (INDEPENDENT_AMBULATORY_CARE_PROVIDER_SITE_OTHER): Payer: Medicaid Other | Admitting: Obstetrics & Gynecology

## 2018-04-12 VITALS — BP 139/96 | HR 99 | Wt 299.7 lb

## 2018-04-12 DIAGNOSIS — N939 Abnormal uterine and vaginal bleeding, unspecified: Secondary | ICD-10-CM

## 2018-04-12 DIAGNOSIS — E0865 Diabetes mellitus due to underlying condition with hyperglycemia: Secondary | ICD-10-CM | POA: Diagnosis not present

## 2018-04-12 DIAGNOSIS — Z716 Tobacco abuse counseling: Secondary | ICD-10-CM | POA: Diagnosis not present

## 2018-04-12 MED ORDER — MEGESTROL ACETATE 40 MG PO TABS
40.0000 mg | ORAL_TABLET | Freq: Two times a day (BID) | ORAL | 5 refills | Status: DC
Start: 2018-04-12 — End: 2018-06-30

## 2018-04-12 NOTE — Progress Notes (Signed)
History:  42 y.o. [id number] here today for AUB. S/p c-section x 2. Pt is s/p hysteroscopy with D&C and endometrial ablation 12/07/2017. On her visit of 01/31/2018 she reports control of bleeding but. Increased cramping.  Pt reports that since 02/2018 she has had bleeding is heavy and passing large clots. Pt reports bleeding 7-10 days pre month. Pt was initially referred to me for a RATH but, given pts multiple medical problems, a decision was made to try a more conservative approach. Pt reports that she wants to proceed to definitve management.   Pt reports smoking 5 cigs per day.  Her husband and daughter live in the home and both smoke. Her daughter is quitting.    She reports that she had some control with the Megace but, does not want to be on meds long term.   The following portions of the patient's history were reviewed and updated as appropriate: allergies, current medications, past family history, past medical history, past social history, past surgical history and problem list.  Review of Systems:  She was prev told that she had DM. She is on no meds and her [place] in 09/2017 was >300. She reports that she has lost weight. She is on no meds and she does not check her glc.     Objective:  Physical Exam BP (!) 139/96   Pulse 99   Wt 299 lb 11.2 oz (135.9 kg)   BMI 50.65 kg/m   CONSTITUTIONAL: Well-developed, well-nourished female in no acute distress.  HENT:  Normocephalic, atraumatic EYES: Conjunctivae and EOM are normal. No scleral icterus.  NECK: Normal range of motion SKIN: Skin is warm and dry. No rash noted. Not diaphoretic.No pallor. [place]: Alert and oriented to person, place, and time. Normal coordination.   Labs and Imaging 12/07/2017 Diagnosis Endometrium, curettage - INACTIVE ENDOMETRIUM WITH PROGESTATIONAL CHANGES. 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 - NO HYPERPLASIA OR CARCINOMA.   09/10/2017 CLINICAL DATA:  Abnormal uterine  bleeding, menorrhagia, fibroids, cyst follow-up  EXAM: TRANSABDOMINAL AND TRANSVAGINAL ULTRASOUND OF PELVIS  TECHNIQUE: Both transabdominal and transvaginal ultrasound examinations of the pelvis were performed. Transabdominal technique was performed for global imaging of the pelvis including uterus, ovaries, adnexal regions, and pelvic cul-de-sac. It was necessary to proceed with endovaginal exam following the transabdominal exam to visualize the uterus, endometrium and ovaries.  COMPARISON:  10/02/2016  FINDINGS: Uterus  Measurements: 12.3 x 6.5 x 7.9 cm. Heterogeneous appearance of myometrium. Poor definition of endometrial complex. Question anterior wall leiomyoma at upper uterine segment versus artifact, 2.5 x 2.5 x 2.8 cm. Fundal leiomyoma seen on the previous exam is inadequately visualized due to body habitus and poor acoustic window on transabdominal imaging. Anterior wall scarring from prior Caesarean section. No additional uterine mass lesion.  Endometrium  Thickness: 10 mm thick.  No endometrial fluid or focal abnormality  Right ovary  Not visualized on either transabdominal or endovaginal imaging suspect related to combination of body habitus and bowel gas  Left ovary  Not visualized on either transabdominal or endovaginal imaging suspect related to combination of body habitus and bowel gas  Other findings  No free pelvic fluid.  No adnexal masses visualized.  IMPRESSION: Prior Caesarean section with anterior wall uterine scar.  Questionable small anterior wall leiomyoma versus artifact.  Heterogeneous myometrium nonspecific but could represent adenomyosis.  Nonvisualization of ovaries.  Assessment & Plan:  AUB- failed endometrial ablation. Pt desires definitive management ,.She is s/p 2 c-section. She is requesting a [place].  Patient desires surgical management with [place] with bilateral salpingectomy.  The risks of surgery were  discussed in detail with the patient including but not limited to: bleeding which may require transfusion or reoperation; infection which may require prolonged hospitalization or re-hospitalization and antibiotic therapy; injury to bowel, bladder, ureters and major vessels or other surrounding organs; need for additional procedures including laparotomy; thromboembolic phenomenon, incisional problems and other postoperative or anesthesia complications.  Patient was told that the likelihood that her condition and symptoms will be treated effectively with this surgical management was very high; the postoperative expectations were also discussed in detail. The patient also understands the alternative treatment options which were discussed in full. All questions were answered.  She was told that she will be contacted by our surgical scheduler regarding the time and date of her surgery; routine preoperative instructions of having nothing to eat or drink after midnight on the day prior to surgery and also coming to the hospital 1 1/2 hours prior to her time of surgery were also emphasized.  She was told she may be called for a preoperative appointment about a week prior to surgery and will be given further preoperative instructions at that visit. Printed patient education handouts about the procedure were given to the patient to review at home.  Labs today: CBC, HgbA1c and TSH  Given pts prev h/o elevated Glc with a dx of DM that has been unregulated, I suspect that pts case will need to be delayed until adequate glc control. I have explained this to her and her daughter.  Will post case but, will cancel of glc and HgbA1c elevated. Pt instructed to make appt with primare care provider.   Recommnedated tob cesation. Counseled on such.  Total face-to-face time with patient was 25 min.  Greater than 50% was spent in counseling and coordination of care with the patient.   Mika Anastasi L. Harraway-Smith, M.D., [name]

## 2018-04-12 NOTE — Patient Instructions (Signed)
Total Laparoscopic Hysterectomy A total laparoscopic hysterectomy is a minimally invasive surgery to remove your uterus and cervix. This surgery is performed by making several small cuts (incisions) in your abdomen. It can also be done with a thin, lighted tube (laparoscope) inserted into two small incisions in your lower abdomen. Your fallopian tubes and ovaries can be removed (bilateral salpingo-oophorectomy) during this surgery as well.Benefits of minimally invasive surgery include:  Less pain.  Less risk of blood loss.  Less risk of infection.  Quicker return to normal activities.  Tell a health care provider about:  Any allergies you have.  All medicines you are taking, including vitamins, herbs, eye drops, creams, and over-the-counter medicines.  Any problems you or family members have had with anesthetic medicines.  Any blood disorders you have.  Any surgeries you have had.  Any medical conditions you have. What are the risks? Generally, this is a safe procedure. However, as with any procedure, complications can occur. Possible complications include:  Bleeding.  Blood clots in the legs or lung.  Infection.  Injury to surrounding organs.  Problems with anesthesia.  Early menopause symptoms (hot flashes, night sweats, insomnia).  Risk of conversion to an open abdominal incision.  What happens before the procedure?  Ask your health care provider about changing or stopping your regular medicines.  Do not take aspirin or blood thinners (anticoagulants) for 1 week before the surgery or as told by your health care provider.  Do not eat or drink anything for 8 hours before the surgery or as told by your health care provider.  Quit smoking if you smoke.  Arrange for a ride home after surgery and for someone to help you at home during recovery. What happens during the procedure?  You will be given antibiotic medicine.  An IV tube will be placed in your arm. You  will be given medicine to make you sleep (general anesthetic).  A gas (carbon dioxide) will be used to inflate your abdomen. This will allow your surgeon to look inside your abdomen, perform your surgery, and treat any other problems found if necessary.  Three or four small incisions (often less than 1/2 inch) will be made in your abdomen. One of these incisions will be made in the area of your belly button (navel). The laparoscope will be inserted into the incision. Your surgeon will look through the laparoscope while doing your procedure.  Other surgical instruments will be inserted through the other incisions.  Your uterus may be removed through your vagina or cut into small pieces and removed through the small incisions.  Your incisions will be closed. What happens after the procedure?  The gas will be released from inside your abdomen.  You will be taken to the recovery area where a nurse will watch and check your progress. Once you are awake, stable, and taking fluids well, without other problems, you will return to your room or be allowed to go home.  There is usually minimal discomfort following the surgery because the incisions are so small.  You will be given pain medicine while you are in the hospital and for when you go home. This information is not intended to replace advice given to you by your health care provider. Make sure you discuss any questions you have with your health care provider. Document Released: 04/19/2007 Document Revised: 11/28/2015 Document Reviewed: 01/10/2013 Elsevier Interactive Patient Education  2017 [employer] [employer]. Total Laparoscopic Hysterectomy, Care After Refer to this sheet in the next few  weeks. These instructions provide you with information on caring for yourself after your procedure. Your health care provider may also give you more specific instructions. Your treatment has been planned according to current medical practices, but problems sometimes  occur. Call your health care provider if you have any problems or questions after your procedure. What can I expect after the procedure?  Pain and bruising at the incision sites. You will be given pain medicine to control it.  Menopausal symptoms such as hot flashes, night sweats, and insomnia if your ovaries were removed.  Sore throat from the breathing tube that was inserted during surgery. Follow these instructions at home:  Only take over-the-counter or prescription medicines for pain, discomfort, or fever as directed by your health care provider.  Do not take aspirin. It can cause bleeding.  Do not drive when taking pain medicine.  Follow your health care provider's advice regarding diet, exercise, lifting, driving, and general activities.  Resume your usual diet as directed and allowed.  Get plenty of rest and sleep.  Do not douche, use tampons, or have sexual intercourse for at least 6 weeks, or until your health care provider gives you permission.  Change your bandages (dressings) as directed by your health care provider.  Monitor your temperature and notify your health care provider of a fever.  Take showers instead of baths for 2-3 weeks.  Do not drink alcohol until your health care provider gives you permission.  If you develop constipation, you may take a mild laxative with your health care provider's permission. Bran foods may help with constipation problems. Drinking enough fluids to keep your urine clear or pale yellow may help as well.  Try to have someone home with you for 1-2 weeks to help around the house.  Keep all of your follow-up appointments as directed by your health care provider. Contact a health care provider if:  You have swelling, redness, or increasing pain around your incision sites.  You have pus coming from your incision.  You notice a bad smell coming from your incision.  Your incision breaks open.  You feel dizzy or  lightheaded.  You have pain or bleeding when you urinate.  You have persistent diarrhea.  You have persistent nausea and vomiting.  You have abnormal vaginal discharge.  You have a rash.  You have any type of abnormal reaction or develop an allergy to your medicine.  You have poor pain control with your prescribed medicine. Get help right away if:  You have chest pain or shortness of breath.  You have severe abdominal pain that is not relieved with pain medicine.  You have pain or swelling in your legs. This information is not intended to replace advice given to you by your health care provider. Make sure you discuss any questions you have with your health care provider. Document Released: 04/12/2013 Document Revised: 11/28/2015 Document Reviewed: 01/10/2013 Elsevier Interactive Patient Education  2017 [employer].

## 2018-04-12 NOTE — Progress Notes (Signed)
Pt BP elevated.  Pt smoked a cigarette prior to entering the office.  Pt reports she is seeing her PCP about her BP control.  Pt has not yet taken her medication today.

## 2018-04-13 LAB — HEMOGLOBIN A1C
Est. average glucose Bld gHb Est-mCnc: 223 mg/dL
Hgb A1c MFr Bld: 9.4 % — ABNORMAL HIGH (ref 4.8–5.6)

## 2018-04-13 LAB — CBC
Hematocrit: 37 % (ref 34.0–46.6)
Hemoglobin: 12.2 g/dL (ref 11.1–15.9)
MCH: 25 pg — AB (ref 26.6–33.0)
MCHC: 33 g/dL (ref 31.5–35.7)
MCV: 76 fL — AB (ref 79–97)
PLATELETS: 519 10*3/uL — AB (ref 150–450)
RBC: 4.88 x10E6/uL (ref 3.77–5.28)
RDW: 14.3 % (ref 12.3–15.4)
WBC: 5.6 10*3/uL (ref 3.4–10.8)

## 2018-04-13 LAB — TSH: TSH: 2.01 u[IU]/mL (ref 0.450–4.500)

## 2018-04-16 ENCOUNTER — Encounter (HOSPITAL_COMMUNITY): Payer: Self-pay

## 2018-04-16 ENCOUNTER — Ambulatory Visit (HOSPITAL_COMMUNITY)
Admission: EM | Admit: 2018-04-16 | Discharge: 2018-04-16 | Disposition: A | Discharge status: Home / Self Care | Payer: Medicaid Other | Attending: Family Medicine | Admitting: Family Medicine

## 2018-04-16 ENCOUNTER — Other Ambulatory Visit: Payer: Self-pay

## 2018-04-16 DIAGNOSIS — R200 Anesthesia of skin: Secondary | ICD-10-CM | POA: Diagnosis not present

## 2018-04-16 DIAGNOSIS — M79601 Pain in right arm: Secondary | ICD-10-CM

## 2018-04-16 MED ORDER — PREDNISONE 50 MG PO TABS: 50.0000 mg | ORAL_TABLET | Freq: Every day | ORAL | 0 refills | Status: DC

## 2018-04-16 NOTE — ED Provider Notes (Signed)
[facility]    CSN: [id number] Arrival date & time: 04/16/18  1000     History   Chief Complaint Chief Complaint  Patient presents with  . Numbness    HPI Ruth [name] is a 42 y.o. female.   42 year old female comes in for 5-day history of numbness to the right arm and fingers.  Patient states had blood drawn 5 days ago, during stick at the antecubital region, caused shooting pain up and down her arm, and has had numbness since.  States was told to take ibuprofen for the symptoms, and has been taking ibuprofen 800 mg every 4-6 hours without relief.  She has numbness to the first 3 fingers.  And shooting pain is worse with movement.  She denies any decrease in range of motion, and thinks her forearm is slightly more swollen.     Past Medical History:  Diagnosis Date  . Anemia   . Arthritis    chronic back pain   . Asthma   . Bipolar affective disorder ([place])   . Bronchitis   . Diabetes mellitus    Type 2  . Fibromyalgia   . Hx of migraines    occ  . Hypertension   . Obesity   . Thyroid disease     Patient Active Problem List   Diagnosis Date Noted  . Abnormal uterine bleeding (AUB) 12/07/2017  . Abnormal mammogram of both breasts 11/02/2016  . HYPOTHYROIDISM 06/21/2009  . DM 06/21/2009  . BIPOLAR AFFECTIVE DISORDER 06/21/2009  . 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 INADEQUATE SLEEP HYGIENE 06/21/2009  . OBSTRUCTIVE SLEEP APNEA 06/21/2009  . 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 CARPAL TUNNEL SYNDROME 06/20/2009  . MERALGIA PARESTHETICA 06/20/2009  . HYPERTENSION 06/20/2009  . FIBROMYALGIA 06/20/2009  . CEPHALGIA 06/20/2009    Past Surgical History:  Procedure Laterality Date  . CARPAL TUNNEL RELEASE Right   . CESAREAN SECTION     x2  . DILITATION & CURRETTAGE/HYSTROSCOPY WITH NOVASURE ABLATION N/A 12/07/2017   Procedure: DILATATION & CURETTAGE/HYSTEROSCOPY WITH ATTEMPED NOVASURE ABLATION, FAILED/ENDOMETERIAL ABLATION  WITH HTA SYSTEM;  Surgeon: Lavonia Drafts, MD;  Location: [facility];  Service: Gynecology;  Laterality: N/A;  . ELBOW SURGERY     "surgery for nerve pain"  . GANGLION CYST EXCISION Right    right arm   . TONSILLECTOMY     childhood  . TUBAL LIGATION    . VAGINA SURGERY     fistula    OB History    Gravida  7   Para  5   Term  4   Preterm  1   AB  2   Living  5     SAB  2   TAB      Ectopic      Multiple      Live Births  4            Home Medications    Prior to Admission medications   Medication Sig Start Date End Date Taking? Authorizing Provider  albuterol (PROVENTIL HFA;VENTOLIN HFA) 108 (90 Base) MCG/ACT inhaler Inhale 1-2 puffs into the lungs every 6 (six) hours as needed for wheezing. 05/03/16   [provider]  amLODipine (NORVASC) 5 MG tablet Take 5 mg by mouth daily. 08/01/17   [provider]  BELBUCA 75 MCG FILM Take 1 Film by mouth every 12 (twelve) hours.  10/15/17   [provider]  cetirizine (ZYRTEC) 10 MG tablet Take 10 mg by mouth  daily.    [provider]  clotrimazole (GYNE-LOTRIMIN) 1 % vaginal cream Apply to affected area bid until clear Patient not taking: Reported on 04/12/2018 01/31/18   Lavonia Drafts, MD  diclofenac (CATAFLAM) 50 MG tablet Take 1 tablet (50 mg total) by mouth 3 (three) times daily. Patient not taking: Reported on 04/12/2018 01/31/18   Lavonia Drafts, MD  diclofenac sodium (VOLTAREN) 1 % GEL Apply 2 g topically 4 (four) times daily. 02/04/18   Tasia Catchings, Amy V, PA-C  fluticasone (FLONASE) 50 MCG/ACT nasal spray Place 1-2 sprays into both nostrils daily for 7 days. 03/27/18 04/03/18  Wieters, Hallie C, PA-C  HYDROcodone-homatropine (HYCODAN) 5-1.5 MG/5ML syrup Take 5 mLs by mouth every 6 (six) hours as needed for cough. 03/27/18   Wieters, Hallie C, PA-C  ibuprofen (ADVIL,MOTRIN) 600 MG tablet Take 1 tablet (600 mg total) by mouth every 6 (six) hours as needed. 12/07/17    Lavonia Drafts, MD  megestrol (MEGACE) 40 MG tablet Take 1 tablet (40 mg total) by mouth 2 (two) times daily. Can increase to two tablets twice a day in the event of heavy bleeding 04/12/18   Lavonia Drafts, MD  predniSONE (DELTASONE) 50 MG tablet Take 1 tablet (50 mg total) by mouth daily. 04/16/18   Tasia Catchings, Amy V, PA-C  triamterene-hydrochlorothiazide (MAXZIDE-25) 37.5-25 MG tablet Take 1 tablet by mouth daily. 08/01/17   [provider]    Family History Family History  Problem Relation Age of Onset  . Healthy Mother   . Healthy Father   . Breast cancer Neg Hx     Social History Social History   Tobacco Use  . Smoking status: Current Every Day Smoker    Packs/day: 0.25    Types: Cigarettes  . Smokeless tobacco: Never Used  Substance Use Topics  . Alcohol use: No  . Drug use: Yes    Types: Marijuana    Comment: occasional     Allergies   Penicillins   Review of Systems Review of Systems  Reason unable to perform ROS: See HPI as above.     Physical Exam Triage Vital Signs ED Triage Vitals  Enc Vitals Group     BP 04/16/18 1011 (!) 147/99     Pulse Rate 04/16/18 1011 (!) 110     Resp 04/16/18 1011 18     Temp 04/16/18 1011 97.8 F (36.6 C)     Temp src --      SpO2 04/16/18 1011 100 %     Weight 04/16/18 1016 299 lb (135.6 kg)     Height --      Head Circumference --      Peak Flow --      Pain Score 04/16/18 1016 7     Pain Loc --      Pain Edu? --      Excl. in [place]? --    No data found.  Updated Vital Signs BP (!) 147/99 (BP Location: Left Arm)   Pulse (!) 110   Temp 97.8 F (36.6 C)   Resp 18   Wt 299 lb (135.6 kg)   SpO2 100%   BMI 50.53 kg/m   Physical Exam  Constitutional: She is oriented to person, place, and time. She appears well-developed and well-nourished. No distress.  HENT:  Head: Normocephalic and atraumatic.  Eyes: Pupils are equal, round, and reactive to light. Conjunctivae are normal.  Cardiovascular:  Normal rate, regular rhythm and normal heart sounds. Exam reveals no gallop and no  friction rub.  No murmur heard. Pulmonary/Chest: Effort normal and breath sounds normal. No accessory muscle usage or stridor. No respiratory distress. She has no decreased breath sounds. She has no wheezes. She has no rhonchi. She has no rales.  Musculoskeletal:  No obvious swelling, erythema, warmth, contusion seen. Tenderness to palpation diffusely of the right forearm. No tenderness to palpation of the upper arm, fingers. Full ROM of shoulder, elbow, wrist, fingers. Decrease in sensation of 1st to 3rd fingers of the right hand. Radial pulse 2+, cap refill <2s  Neurological: She is alert and oriented to person, place, and time.  Skin: Skin is warm and dry. She is not diaphoretic.     UC Treatments / Results  Labs (all labs ordered are listed, but only abnormal results are displayed) Labs Reviewed - No data to display  EKG None  Radiology No results found.  Procedures Procedures (including critical care time)  Medications Ordered in UC Medications - No data to display  Initial Impression / Assessment and Plan / UC Course  I have reviewed the triage vital signs and the nursing notes.  Pertinent labs & imaging results that were available during my care of the patient were reviewed by me and considered in my medical decision making (see chart for details).    Discussed possible irritation to the median nerve. Given no improvement with NSAIDs, will try course of prednisone. Will provide sling as decrease in movement helps with symptoms. Return precautions given. Patient expresses understanding and agrees to plan.  Final Clinical Impressions(s) / UC Diagnoses   Final diagnoses:  Finger numbness    ED Prescriptions    Medication Sig Dispense Auth. Provider   predniSONE (DELTASONE) 50 MG tablet Take 1 tablet (50 mg total) by mouth daily. 5 tablet Tobin Chad,  PA-C 04/16/18 1046

## 2018-04-16 NOTE — Discharge Instructions (Signed)
Start prednisone as directed. You can use the sling to help control symptoms, but as discussed, avoid using for too long as it may cause your other shoulder to stiffen and hurt. Follow up with orthopedics for further evaluation if symptoms not improving.

## 2018-04-16 NOTE — ED Triage Notes (Signed)
Pt states she has numbness in her right hand after having her blood drawn. Pt states she felt something pop in her right forearm.

## 2018-04-26 ENCOUNTER — Other Ambulatory Visit: Payer: Self-pay

## 2018-04-26 ENCOUNTER — Encounter (HOSPITAL_COMMUNITY): Payer: Self-pay | Admitting: Emergency Medicine

## 2018-04-26 ENCOUNTER — Emergency Department (HOSPITAL_COMMUNITY)
Admission: EM | Admit: 2018-04-26 | Discharge: 2018-04-27 | Disposition: A | Discharge status: Home / Self Care | Payer: Medicaid Other | Attending: Emergency Medicine | Admitting: Emergency Medicine

## 2018-04-26 DIAGNOSIS — I10 Essential (primary) hypertension: Secondary | ICD-10-CM | POA: Insufficient documentation

## 2018-04-26 DIAGNOSIS — R600 Localized edema: Secondary | ICD-10-CM | POA: Diagnosis present

## 2018-04-26 DIAGNOSIS — E119 Type 2 diabetes mellitus without complications: Secondary | ICD-10-CM | POA: Insufficient documentation

## 2018-04-26 DIAGNOSIS — E039 Hypothyroidism, unspecified: Secondary | ICD-10-CM | POA: Insufficient documentation

## 2018-04-26 DIAGNOSIS — F17210 Nicotine dependence, cigarettes, uncomplicated: Secondary | ICD-10-CM | POA: Insufficient documentation

## 2018-04-26 DIAGNOSIS — Z79899 Other long term (current) drug therapy: Secondary | ICD-10-CM | POA: Insufficient documentation

## 2018-04-26 DIAGNOSIS — T783XXA Angioneurotic edema, initial encounter: Secondary | ICD-10-CM

## 2018-04-26 MED ORDER — DIPHENHYDRAMINE HCL 25 MG PO CAPS
25.0000 mg | ORAL_CAPSULE | Freq: Once | ORAL | Status: AC
  Administered 2018-04-26: 25 mg via ORAL
  Filled 2018-04-26: qty 1

## 2018-04-26 MED ORDER — PREDNISONE 20 MG PO TABS
60.0000 mg | ORAL_TABLET | Freq: Once | ORAL | Status: AC
  Administered 2018-04-26: 60 mg via ORAL
  Filled 2018-04-26: qty 3

## 2018-04-26 NOTE — ED Triage Notes (Signed)
Pt's top lip is swollen on the right side face.  She ended up taking out her lip piercing out b/t it got too tight.  Pt reports some drainage from the lip.  No respiratory distress

## 2018-04-27 MED ORDER — CLINDAMYCIN HCL 150 MG PO CAPS: 300.0000 mg | ORAL_CAPSULE | Freq: Three times a day (TID) | ORAL | 0 refills | Status: DC

## 2018-04-27 NOTE — Discharge Instructions (Signed)
Contact a health care provider if: °You have repeated episodes of angioedema. °Episodes of angioedema start to happen more often than they used to, even after you take steps to prevent them. °You have episodes of angioedema that are more severe than they have been before, even after you take steps to prevent them. °You are thinking about having children. °Get help right away if: °You have severe swelling of your mouth, tongue, or lips. °You have trouble breathing. °You have trouble swallowing. °You faint. °

## 2018-04-27 NOTE — ED Provider Notes (Signed)
[facility] EMERGENCY DEPARTMENT Provider Note   CSN: [id number] Arrival date & time: 04/26/18  2156     History   Chief Complaint Chief Complaint  Patient presents with  . lip swelling    HPI Ruth [name] is a 42 y.o. female who presents the emergency department chief complaint of lip swelling.  Patient states that she woke this morning with some mild swelling in the right side of her lower lip that has increased over time.  She states that he has been the same for about the past 3 hours prior to arrival but she did have to take her lip piercing out.  She denies any significant pain, discharge heat in the lip.  She denies any injuries to the lip.  She does not take any ACE inhibitors or ARB's.  She has never had anything like this before and has no family history of the same.  She denies any history of food or other allergies.  HPI  Past Medical History:  Diagnosis Date  . Anemia   . Arthritis    chronic back pain   . Asthma   . Bipolar affective disorder ([place])   . Bronchitis   . Diabetes mellitus    Type 2  . Fibromyalgia   . Hx of migraines    occ  . Hypertension   . Obesity   . Thyroid disease     Patient Active Problem List   Diagnosis Date Noted  . Abnormal uterine bleeding (AUB) 12/07/2017  . Abnormal mammogram of both breasts 11/02/2016  . HYPOTHYROIDISM 06/21/2009  . DM 06/21/2009  . BIPOLAR AFFECTIVE DISORDER 06/21/2009  . 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 INADEQUATE SLEEP HYGIENE 06/21/2009  . OBSTRUCTIVE SLEEP APNEA 06/21/2009  . 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 CARPAL TUNNEL SYNDROME 06/20/2009  . MERALGIA PARESTHETICA 06/20/2009  . HYPERTENSION 06/20/2009  . FIBROMYALGIA 06/20/2009  . CEPHALGIA 06/20/2009    Past Surgical History:  Procedure Laterality Date  . CARPAL TUNNEL RELEASE Right   . CESAREAN SECTION     x2  . DILITATION & CURRETTAGE/HYSTROSCOPY WITH NOVASURE ABLATION N/A 12/07/2017     Procedure: DILATATION & CURETTAGE/HYSTEROSCOPY WITH ATTEMPED NOVASURE ABLATION, FAILED/ENDOMETERIAL ABLATION WITH HTA SYSTEM;  Surgeon: Lavonia Drafts, MD;  Location: [facility];  Service: Gynecology;  Laterality: N/A;  . ELBOW SURGERY     "surgery for nerve pain"  . GANGLION CYST EXCISION Right    right arm   . TONSILLECTOMY     childhood  . TUBAL LIGATION    . VAGINA SURGERY     fistula     OB History    Gravida  7   Para  5   Term  4   Preterm  1   AB  2   Living  5     SAB  2   TAB      Ectopic      Multiple      Live Births  4            Home Medications    Prior to Admission medications   Medication Sig Start Date End Date Taking? Authorizing Provider  albuterol (PROVENTIL HFA;VENTOLIN HFA) 108 (90 Base) MCG/ACT inhaler Inhale 1-2 puffs into the lungs every 6 (six) hours as needed for wheezing. 05/03/16   [provider]  amLODipine (NORVASC) 5 MG tablet Take 5 mg by mouth daily. 08/01/17   [provider]  BELBUCA 75 MCG FILM Take 1 Film  by mouth every 12 (twelve) hours.  10/15/17   [provider]  cetirizine (ZYRTEC) 10 MG tablet Take 10 mg by mouth daily.    [provider]  clotrimazole (GYNE-LOTRIMIN) 1 % vaginal cream Apply to affected area bid until clear Patient not taking: Reported on 04/12/2018 01/31/18   Lavonia Drafts, MD  diclofenac (CATAFLAM) 50 MG tablet Take 1 tablet (50 mg total) by mouth 3 (three) times daily. Patient not taking: Reported on 04/12/2018 01/31/18   Lavonia Drafts, MD  diclofenac sodium (VOLTAREN) 1 % GEL Apply 2 g topically 4 (four) times daily. 02/04/18   Tasia Catchings, Amy V, PA-C  fluticasone (FLONASE) 50 MCG/ACT nasal spray Place 1-2 sprays into both nostrils daily for 7 days. 03/27/18 04/03/18  Wieters, Hallie C, PA-C  HYDROcodone-homatropine (HYCODAN) 5-1.5 MG/5ML syrup Take 5 mLs by mouth every 6 (six) hours as needed for cough. 03/27/18   Wieters, Hallie C, PA-C  ibuprofen  (ADVIL,MOTRIN) 600 MG tablet Take 1 tablet (600 mg total) by mouth every 6 (six) hours as needed. 12/07/17   Lavonia Drafts, MD  megestrol (MEGACE) 40 MG tablet Take 1 tablet (40 mg total) by mouth 2 (two) times daily. Can increase to two tablets twice a day in the event of heavy bleeding 04/12/18   Lavonia Drafts, MD  predniSONE (DELTASONE) 50 MG tablet Take 1 tablet (50 mg total) by mouth daily. 04/16/18   Tasia Catchings, Amy V, PA-C  triamterene-hydrochlorothiazide (MAXZIDE-25) 37.5-25 MG tablet Take 1 tablet by mouth daily. 08/01/17   [provider]    Family History Family History  Problem Relation Age of Onset  . Healthy Mother   . Healthy Father   . Breast cancer Neg Hx     Social History Social History   Tobacco Use  . Smoking status: Current Every Day Smoker    Packs/day: 0.25    Types: Cigarettes  . Smokeless tobacco: Never Used  Substance Use Topics  . Alcohol use: No  . Drug use: Yes    Types: Marijuana    Comment: occasional     Allergies   Penicillins   Review of Systems Review of Systems  Ten systems reviewed and are negative for acute change, except as noted in the HPI.   Physical Exam Updated Vital Signs BP (!) 156/101 (BP Location: Left Wrist)   Pulse (!) 107   Temp 98.2 F (36.8 C) (Oral)   Ht 5\' 6"  (1.676 m)   Wt 135.6 kg   SpO2 100%   BMI 48.26 kg/m   Physical Exam  Constitutional: She is oriented to person, place, and time. She appears well-developed and well-nourished. No distress.  HENT:  Head: Normocephalic and atraumatic.  Minimal swelling and tightness on the right lower lip.  No sensation of fullness in the hypoglossal region.  Normal phonation.  No tongue swelling, uvula midline.  Oropharynx clear and moist without edema.  Eyes: Conjunctivae are normal. No scleral icterus.  Neck: Normal range of motion.  Cardiovascular: Normal rate, regular rhythm and normal heart sounds. Exam reveals no gallop and no friction rub.    No murmur heard. Pulmonary/Chest: Effort normal and breath sounds normal. No respiratory distress. She has no wheezes.  Abdominal: Soft. Bowel sounds are normal. She exhibits no distension and no mass. There is no tenderness. There is no guarding.  Musculoskeletal: Normal range of motion.  Neurological: She is alert and oriented to person, place, and time.  Skin: Skin is warm and dry. No rash noted. She is  not diaphoretic.  No hives  Psychiatric: Her behavior is normal.  Nursing note and vitals reviewed.    ED Treatments / Results  Labs (all labs ordered are listed, but only abnormal results are displayed) Labs Reviewed - No data to display  EKG None  Radiology No results found.  Procedures Procedures (including critical care time)  Medications Ordered in ED Medications  predniSONE (DELTASONE) tablet 60 mg (60 mg Oral Given 04/26/18 2359)  diphenhydrAMINE (BENADRYL) capsule 25 mg (25 mg Oral Given 04/26/18 2359)     Initial Impression / Assessment and Plan / ED Course  I have reviewed the triage vital signs and the nursing notes.  Pertinent labs & imaging results that were available during my care of the patient were reviewed by me and considered in my medical decision making (see chart for details).     Patient with stable angioedema since about 12 PM today.  Swelling started when she awoke up earlier around 8 AM.  Patient case discussed with Dr. Wyvonnia Dusky.  She has been in the emergency department for about 3 hours.  She was treated with prednisone and Benadryl with no worsening in her symptoms.  Given the length of time it has been stable I feel she is safe to go home.  She has no tongue hypoglossal or pharyngeal involvement.  Voice is normal and she is having no signs of generalized anaphylactoid reaction.  I discussed reasons to seek immediate medical care.  The patient is also advised to follow closely with her primary care physician.  Patient given a prescription for  clindamycin to hold should she develop any heat redness or excessive tenderness in the area of swelling where her lip ring was present.  She understands when to use the medication.  Otherwise she will hold off.  She should return for worsening and swelling.  Final Clinical Impressions(s) / ED Diagnoses   Final diagnoses:  None    ED Discharge Orders         Ordered    clindamycin (CLEOCIN) 150 MG capsule  3 times daily     04/27/18 0048           Margarita Mail, PA-C 04/28/18 1546    Rancour, Annie Main, MD 04/29/18 [phone]

## 2018-05-11 ENCOUNTER — Encounter (HOSPITAL_COMMUNITY): Payer: Self-pay

## 2018-06-23 ENCOUNTER — Encounter: Payer: Self-pay | Admitting: Obstetrics & Gynecology

## 2018-06-23 ENCOUNTER — Ambulatory Visit (INDEPENDENT_AMBULATORY_CARE_PROVIDER_SITE_OTHER): Payer: Medicaid Other | Admitting: Obstetrics & Gynecology

## 2018-06-23 VITALS — BP 131/84 | HR 98 | Temp 98.0°F | Ht 66.0 in | Wt 301.1 lb

## 2018-06-23 DIAGNOSIS — Z01818 Encounter for other preprocedural examination: Secondary | ICD-10-CM | POA: Diagnosis present

## 2018-06-23 DIAGNOSIS — E0800 Diabetes mellitus due to underlying condition with hyperosmolarity without nonketotic hyperglycemic-hyperosmolar coma (NKHHC): Secondary | ICD-10-CM

## 2018-06-23 DIAGNOSIS — Z794 Long term (current) use of insulin: Secondary | ICD-10-CM

## 2018-06-23 LAB — HEMOGLOBIN A1C
ESTIMATED AVERAGE GLUCOSE: 289 mg/dL
HEMOGLOBIN A1C: 11.7 % — AB (ref 4.8–5.6)

## 2018-06-23 MED ORDER — GLUCOSE BLOOD VI STRP: ORAL_STRIP | 12 refills | Status: DC

## 2018-06-23 NOTE — Progress Notes (Signed)
Pt states that 2 weeks ago she increased dose of Megace to 2 tablets twice daily. This dose has not decreased her vaginal bleeding. Pt has had a cough x1 week.

## 2018-06-23 NOTE — Progress Notes (Signed)
History:  42 y.o. Ruth [name] here today for eval and discussion preop. Pt is a diabetic and was not compliant with her meds she reports that since her lst visit she has been compliant with her meds and her fastign [place] has been <90. She reports that she has altered her diet considerably. She reports that despite the Megace bid she is still bleeding.  She reports that the sx are unbearable.   The following portions of the patient's history were reviewed and updated as appropriate: allergies, current medications, past family history, past medical history, past social history, past surgical history and problem list.  Review of Systems:  Pertinent items are noted in HPI.    Objective:  Physical Exam Blood pressure 131/84, pulse 98, temperature 98 F (36.7 C), height '5\' 6"'  (1.676 m), weight (!) 301 lb 1.6 oz (136.6 kg), last menstrual period 06/09/2018.  CONSTITUTIONAL: Well-developed, well-nourished female in no acute distress.  HENT:  Normocephalic, atraumatic EYES: Conjunctivae and EOM are normal. No scleral icterus.  NECK: Normal range of motion SKIN: Skin is warm and dry. No rash noted. Not diaphoretic.No pallor. [place]: Alert and oriented to person, place, and time. Normal coordination.  Pelvic: not repeated.    Labs and Imaging   Ref Range & Units 67moago 152yrgo  Hgb A1c MFr Bld 4.8 - 5.6 % 9.4High   7.6High  CM    Assessment & Plan:  Pt is preop with an elevated HgbA1C. Pts prev HgbA1c was elevated. I have discussed with her the risks of surgery with an elevated GLC. She reports that he levels are vastly improved and she understand the risks and wishes to proceed. Although it is too early for a full response with the HgbA1c I will obtain it today and if her levels are decreasing, we can proceed with the surgery as scheduled.  HgbA1c, random GL[place].   Reviewed preop info from last visit.  Total face-to-face time with patient was 15 min.  Greater than 50% was spent in counseling  and coordination of care with the patient.   Cristino Degroff L. Harraway-Smith, M.D., FACherlynn June

## 2018-06-30 ENCOUNTER — Other Ambulatory Visit: Payer: Self-pay | Admitting: Obstetrics & Gynecology

## 2018-06-30 DIAGNOSIS — N939 Abnormal uterine and vaginal bleeding, unspecified: Secondary | ICD-10-CM

## 2018-06-30 MED ORDER — NORETHINDRONE ACETATE 5 MG PO TABS: 10.0000 mg | ORAL_TABLET | Freq: Every day | ORAL | 2 refills | Status: DC

## 2018-06-30 NOTE — Progress Notes (Signed)
TC to pt. She is scheduled for surgery 1/7. She was told that her HgbA1C was elevated but, it the trend was decreasing we could move forward. Pt reports better control however, her HgbA1c went for 9.4% to 11.7%.   Pt expressed frustration and asked to go forward with the surgery but, I have apprised her that I am not willing to take the risks as she is not unstable.   She reports continuous bleeding. I have switched her from Megace 40mg  tid to Aygestin 10mg  daily to see if this helps with her AUB. She is s/p endometrial ablation with no relief of sx.  She was asked to f/u with her primary care provider to get her DM under better control.  Keyleigh Manninen L. Harraway-Smith, M.D., [name]

## 2018-06-30 NOTE — Patient Instructions (Signed)
Ruth [name]  06/30/2018   Your procedure is scheduled on: 07-12-17  Report to [facility] Main  Entrance  Report to admitting at 530 AM    Call this number if you have problems the morning of surgery [phone]   Remember: Do not eat food  :After Midnight. BRUSH YOUR TEETH MORNING OF SURGERY AND RINSE YOUR MOUTH OUT, NO CHEWING GUM CANDY OR MINTS. NO SOLID FOOD AFTER MIDNIGHT THE NIGHT PRIOR TO SURGERY. NOTHING BY MOUTH EXCEPT CLEAR LIQUIDS UNTIL 3 HOURS PRIOR TO [place] SURGERY. PLEASE FINISH ENSURE DRINK PER SURGEON ORDER 3 HOURS PRIOR TO SCHEDULED SURGERY TIME WHICH NEEDS TO BE COMPLETED AT 430 AM.  How to Manage Your Diabetes Before and After Surgery    CLEAR LIQUID DIET   Foods Allowed                                                                     Foods Excluded  Coffee and tea, regular and decaf                             liquids that you cannot  Plain Jell-O in any flavor                                             see through such as: Fruit ices (not with fruit pulp)                                     milk, soups, orange juice  Iced Popsicles                                    All solid food Carbonated beverages, regular and diet                                    Cranberry, grape and apple juices Sports drinks like Gatorade Lightly seasoned clear broth or consume(fat free) Sugar, honey syrup  Sample Menu Breakfast                                Lunch                                     Supper Cranberry juice                    Beef broth                            Chicken broth Jell-O  Grape juice                           Apple juice Coffee or tea                        Jell-O                                      Popsicle                                                Coffee or tea                        Coffee or  tea  _____________________________________________________________________   Why is it important to control my blood sugar before and after surgery? . Improving blood sugar levels before and after surgery helps healing and can limit problems. . A way of improving blood sugar control is eating a healthy diet by: o  Eating less sugar and carbohydrates o  Increasing activity/exercise o  Talking with your doctor about reaching your blood sugar goals . High blood sugars (greater than 180 mg/dL) can raise your risk of infections and slow your recovery, so you will need to focus on controlling your diabetes during the weeks before surgery. . Make sure that the doctor who takes care of your diabetes knows about your planned surgery including the date and location.  How do I manage my blood sugar before surgery? . Check your blood sugar at least 4 times a day, starting 2 days before surgery, to make sure that the level is not too high or low. o Check your blood sugar the morning of your surgery when you wake up and every 2 hours until you get to the Short Stay unit. . If your blood sugar is less than 70 mg/dL, you will need to treat for low blood sugar: o Do not take insulin. o Treat a low blood sugar (less than 70 mg/dL) with  cup of clear juice (cranberry or apple), 4 glucose tablets, OR glucose gel. o Recheck blood sugar in 15 minutes after treatment (to make sure it is greater than 70 mg/dL). If your blood sugar is not greater than 70 mg/dL on recheck, call [phone] for further instructions. . Report your blood sugar to the short stay nurse when you get to Short Stay.  . If you are admitted to the hospital after surgery: o Your blood sugar will be checked by the staff and you will probably be given insulin after surgery (instead of oral diabetes medicines) to make sure you have good blood sugar levels. o The goal for blood sugar control after surgery is 80-180 mg/dL.   WHAT DO I DO ABOUT  MY DIABETES MEDICATION?  . TAKE YOUR VICTOZA AND METFORMIN DAY BEFORE SURGERY AS USUAL . DO NOT TAKE YOUR [place] AND METFORMIN DAY OF SURGERY    Patient Signature:  Date:   Nurse Signature:  Date:   Reviewed and Endorsed by [facility] Patient Education Committee, August 2015   Take these medicines the morning of surgery with A SIP OF WATER: ALBUTEROL INHALER IF NEEDED AND BRING INHALER, SYMBICORT, AMLODIPINE, ATROVENT  NASAL SPRAY DO NOT TAKE ANY DIABETIC MEDICATIONS DAY OF YOUR SURGERY                               You may not have any metal on your body including hair pins and              piercings  Do not wear jewelry, make-up, lotions, powders or perfumes, deodorant             Do not wear nail polish.  Do not shave  48 hours prior to surgery.              Men may shave face and neck.   Do not bring valuables to the hospital. [place].  Contacts, dentures or bridgework may not be worn into surgery.  Leave suitcase in the car. After surgery it may be brought to your room.                 Please read over the following fact sheets you were given: _____________________________________________________________________                 [facility] - Preparing for Surgery Before surgery, you can play an important role.  Because skin is not sterile, your skin needs to be as free of germs as possible.  You can reduce the number of germs on your skin by washing with CHG (chlorahexidine gluconate) soap before surgery.  CHG is an antiseptic cleaner which kills germs and bonds with the skin to continue killing germs even after washing. Please DO NOT use if you have an allergy to CHG or antibacterial soaps.  If your skin becomes reddened/irritated stop using the CHG and inform your nurse when you arrive at Short Stay. Do not shave (including legs and underarms) for at least 48 hours prior to the first CHG shower.  You may shave your  face/neck. Please follow these instructions carefully:  1.  Shower with CHG Soap the night before surgery and the  morning of Surgery.  2.  If you choose to wash your hair, wash your hair first as usual with your  normal  shampoo.  3.  After you shampoo, rinse your hair and body thoroughly to remove the  shampoo.                           4.  Use CHG as you would any other liquid soap.  You can apply chg directly  to the skin and wash                       Gently with a scrungie or clean washcloth.  5.  Apply the CHG Soap to your body ONLY FROM THE NECK DOWN.   Do not use on face/ open                           Wound or open sores. Avoid contact with eyes, ears mouth and genitals (private parts).                       Wash face,  Genitals (private parts) with your normal soap.  6.  Wash thoroughly, paying special attention to the area where your surgery  will be performed.  7.  Thoroughly rinse your body with warm water from the neck down.  8.  DO NOT shower/wash with your normal soap after using and rinsing off  the CHG Soap.                9.  Pat yourself dry with a clean towel.            10.  Wear clean pajamas.            11.  Place clean sheets on your bed the night of your first shower and do not  sleep with pets. Day of Surgery : Do not apply any lotions/deodorants the morning of surgery.  Please wear clean clothes to the hospital/surgery center.    WHAT IS A BLOOD TRANSFUSION? Blood Transfusion Information  A transfusion is the replacement of blood or some of its parts. Blood is made up of multiple cells which provide different functions.  Red blood cells carry oxygen and are used for blood loss replacement.  White blood cells fight against infection.  Platelets control bleeding.  Plasma helps clot blood.  Other blood products are available for specialized needs, such as hemophilia or other clotting disorders. BEFORE THE TRANSFUSION  Who gives blood for  transfusions?   Healthy volunteers who are fully evaluated to make sure their blood is safe. This is blood bank blood. Transfusion therapy is the safest it has ever been in the practice of medicine. Before blood is taken from a donor, a complete history is taken to make sure that person has no history of diseases nor engages in risky social behavior (examples are intravenous drug use or sexual activity with multiple partners). The donor's travel history is screened to minimize risk of transmitting infections, such as malaria. The donated blood is tested for signs of infectious diseases, such as HIV and hepatitis. The blood is then tested to be sure it is compatible with you in order to minimize the chance of a transfusion reaction. If you or a relative donates blood, this is often done in anticipation of surgery and is not appropriate for emergency situations. It takes many days to process the donated blood. RISKS AND COMPLICATIONS Although transfusion therapy is very safe and saves many lives, the main dangers of transfusion include:   Getting an infectious disease.  Developing a transfusion reaction. This is an allergic reaction to something in the blood you were given. Every precaution is taken to prevent this. The decision to have a blood transfusion has been considered carefully by your caregiver before blood is given. Blood is not given unless the benefits outweigh the risks. AFTER THE TRANSFUSION  Right after receiving a blood transfusion, you will usually feel much better and more energetic. This is especially true if your red blood cells have gotten low (anemic). The transfusion raises the level of the red blood cells which carry oxygen, and this usually causes an energy increase.  The nurse administering the transfusion will monitor you carefully for complications. HOME CARE INSTRUCTIONS  No special instructions are needed after a transfusion. You may find your energy is better. Speak with  your caregiver about any limitations on activity for underlying diseases you may have. SEEK MEDICAL CARE IF:   Your condition is not improving after your transfusion.  You develop redness or irritation at the intravenous (IV) site. SEEK IMMEDIATE MEDICAL CARE IF:  Any  of the following symptoms occur over the next 12 hours:  Shaking chills.  You have a temperature by mouth above 102 F (38.9 C), not controlled by medicine.  Chest, back, or muscle pain.  People around you feel you are not acting correctly or are confused.  Shortness of breath or difficulty breathing.  Dizziness and fainting.  You get a rash or develop hives.  You have a decrease in urine output.  Your urine turns a dark color or changes to pink, red, or brown. Any of the following symptoms occur over the next 10 days:  You have a temperature by mouth above 102 F (38.9 C), not controlled by medicine.  Shortness of breath.  Weakness after normal activity.  The white part of the eye turns yellow (jaundice).  You have a decrease in the amount of urine or are urinating less often.  Your urine turns a dark color or changes to pink, red, or brown. Document Released: 06/19/2000 Document Revised: 09/14/2011 Document Reviewed: 02/06/2008 [facility] Patient Information 2014 [place], [place].  _______________________________________________________________________

## 2018-06-30 NOTE — Progress Notes (Signed)
heamglobin a1c results 06-23-18 routed to dr Ihor Dow and left message with [place] scheduler

## 2018-07-01 ENCOUNTER — Telehealth: Payer: Self-pay | Admitting: Obstetrics & Gynecology

## 2018-07-01 NOTE — Telephone Encounter (Signed)
Called patient and scheduled for upcoming appointment.

## 2018-07-04 ENCOUNTER — Encounter (HOSPITAL_COMMUNITY)
Admission: RE | Admit: 2018-07-04 | Discharge: 2018-07-04 | Disposition: A | Discharge status: Home / Self Care | Payer: Medicaid Other | Source: Ambulatory Visit

## 2018-07-04 ENCOUNTER — Encounter (HOSPITAL_COMMUNITY): Payer: Self-pay

## 2018-07-12 ENCOUNTER — Encounter (HOSPITAL_BASED_OUTPATIENT_CLINIC_OR_DEPARTMENT_OTHER): Payer: Self-pay

## 2018-07-12 ENCOUNTER — Ambulatory Visit (HOSPITAL_BASED_OUTPATIENT_CLINIC_OR_DEPARTMENT_OTHER): Admit: 2018-07-12 | Payer: Medicaid Other | Admitting: Obstetrics & Gynecology

## 2018-07-12 SURGERY — XI ROBOTIC ASSISTED TOTAL HYSTERECTOMY WITH SALPINGECTOMY
Anesthesia: Choice | Laterality: Bilateral

## 2018-07-19 DIAGNOSIS — S82831A Other fracture of upper and lower end of right fibula, initial encounter for closed fracture: Secondary | ICD-10-CM | POA: Diagnosis not present

## 2018-07-20 DIAGNOSIS — M7661 Achilles tendinitis, right leg: Secondary | ICD-10-CM | POA: Diagnosis not present

## 2018-07-22 ENCOUNTER — Encounter

## 2018-08-04 ENCOUNTER — Other Ambulatory Visit: Payer: Self-pay | Admitting: Obstetrics & Gynecology

## 2018-08-04 ENCOUNTER — Telehealth: Payer: Self-pay | Admitting: *Deleted

## 2018-08-04 DIAGNOSIS — N939 Abnormal uterine and vaginal bleeding, unspecified: Secondary | ICD-10-CM

## 2018-08-04 MED ORDER — TRANEXAMIC ACID 650 MG PO TABS: 1300.0000 mg | ORAL_TABLET | Freq: Three times a day (TID) | ORAL | 2 refills | Status: DC

## 2018-08-04 NOTE — Telephone Encounter (Signed)
Ruth [name] sent another message that she is taking 2 pills a day of aygestin but bleeding.  I called Ruth [name] to discuss. She verified she is not taking megace ; she is taking aygestin 2 pills a day. She states her bleeding had stopped shortly after she started this. She states it started back about 1.5 weeks ago and she changes her pad about every 2 hours. I also asked if she has seen her doctor re: her diabetes and she verified she has . She also stated they have started her on insulin and metformin and her blood sugars are under better control. I informed her I will discuss with Dr. Ihor Dow and call her back.

## 2018-08-04 NOTE — Telephone Encounter (Signed)
Received a message from Organ to call patient and tell  Her to stop the Aygestin and start Lysteda. I called patient and informed to stop Aygestin and start Lysteda ; but to only take this when bleeding and only for up to 5 days at a time. She voices understanding. I also informed her to call us if this does not help her bleeding.

## 2018-08-12 ENCOUNTER — Inpatient Hospital Stay (HOSPITAL_COMMUNITY)
Admission: AD | Admit: 2018-08-12 | Discharge: 2018-08-12 | Disposition: A | Discharge status: Home / Self Care | Payer: Medicaid Other | Attending: Obstetrics and Gynecology | Admitting: Obstetrics and Gynecology

## 2018-08-12 ENCOUNTER — Encounter (HOSPITAL_COMMUNITY): Payer: Self-pay | Admitting: *Deleted

## 2018-08-12 DIAGNOSIS — Z880 Allergy status to penicillin: Secondary | ICD-10-CM | POA: Insufficient documentation

## 2018-08-12 DIAGNOSIS — N939 Abnormal uterine and vaginal bleeding, unspecified: Secondary | ICD-10-CM | POA: Insufficient documentation

## 2018-08-12 DIAGNOSIS — F17210 Nicotine dependence, cigarettes, uncomplicated: Secondary | ICD-10-CM | POA: Insufficient documentation

## 2018-08-12 HISTORY — DX: Hypothyroidism, unspecified: E03.9

## 2018-08-12 LAB — CBC
HCT: 37.3 % (ref 36.0–46.0)
Hemoglobin: 11.9 g/dL — ABNORMAL LOW (ref 12.0–15.0)
MCH: 25.9 pg — ABNORMAL LOW (ref 26.0–34.0)
MCHC: 31.9 g/dL (ref 30.0–36.0)
MCV: 81.3 fL (ref 80.0–100.0)
Platelets: 435 10*3/uL — ABNORMAL HIGH (ref 150–400)
RBC: 4.59 MIL/uL (ref 3.87–5.11)
RDW: 14.3 % (ref 11.5–15.5)
WBC: 6.2 10*3/uL (ref 4.0–10.5)
nRBC: 0 % (ref 0.0–0.2)

## 2018-08-12 LAB — URINALYSIS, ROUTINE W REFLEX MICROSCOPIC
Bilirubin Urine: NEGATIVE
Glucose, UA: NEGATIVE mg/dL
KETONES UR: NEGATIVE mg/dL
LEUKOCYTES UA: NEGATIVE
Nitrite: NEGATIVE
Protein, ur: NEGATIVE mg/dL
Specific Gravity, Urine: 1.015 (ref 1.005–1.030)
pH: 5 (ref 5.0–8.0)

## 2018-08-12 LAB — URINALYSIS, MICROSCOPIC (REFLEX): RBC / HPF: >50 RBC/hpf (ref 0–5)

## 2018-08-12 LAB — POCT PREGNANCY, URINE: Preg Test, Ur: NEGATIVE

## 2018-08-12 MED ORDER — KETOROLAC TROMETHAMINE 60 MG/2ML IM SOLN
60.0000 mg | Freq: Once | INTRAMUSCULAR | Status: AC
  Administered 2018-08-12: 60 mg via INTRAMUSCULAR
  Filled 2018-08-12: qty 2

## 2018-08-12 NOTE — MAU Note (Signed)
Patient presents with Heavy bleeding that has been an on going problem for past year.  Had an ablation about a year ago, was started on Megace when that failed.  Patient reports trying several medications that have not helped, with the last one being a new one started on 2/4 that she was supposed to take TID for 5 days.  She reports continued heavy bleeding as well as lower abdominal pain, headache, and fatigue.  Also reports "hives at night" for past 3 weeks, unsure of the cause.

## 2018-08-12 NOTE — Discharge Instructions (Signed)
Abnormal Uterine Bleeding Abnormal uterine bleeding means bleeding more than usual from your uterus. It can include:  Bleeding between periods.  Bleeding after sex.  Bleeding that is heavier than normal.  Periods that last longer than usual.  Bleeding after you have stopped having your period (menopause). There are many problems that may cause this. You should see a doctor for any kind of bleeding that is not normal. Treatment depends on the cause of the bleeding. Follow these instructions at home:  Watch your condition for any changes.  Change pads or tampons often.  Get regular well-woman exams. Make sure they include a pelvic exam and cervical cancer screening.  Keep all follow-up visits as told by your doctor. This is important. Contact a doctor if:  The bleeding lasts more than one week.  You feel dizzy at times.  You feel like you are going to throw up (nauseous).  You throw up. Get help right away if:  You pass out.  You have to change pads every hour.  You have belly (abdominal) pain.  You have a fever.  You get sweaty.  You get weak.  You passing large blood clots from your vagina. Summary  Abnormal uterine bleeding means bleeding more than usual from your uterus.  There are many problems that may cause this. You should see a doctor for any kind of bleeding that is not normal.  Treatment depends on the cause of the bleeding. This information is not intended to replace advice given to you by your health care provider. Make sure you discuss any questions you have with your health care provider. Document Released: 04/19/2009 Document Revised: 06/16/2016 Document Reviewed: 06/16/2016 Elsevier Interactive Patient Education  2019 [employer].

## 2018-08-12 NOTE — MAU Provider Note (Addendum)
History     CSN: [id number]  Arrival date and time: 08/12/18 1047   First Provider Initiated Contact with Patient 08/12/18 1124      Chief Complaint  Patient presents with  . Vaginal Bleeding  . Urticaria  . Abdominal Pain  . Headache   Ruth [name] is a 43 y.o. [id number] female presenting with vaginal bleeding and cramping. This is an ongoing problem, she has been seen by Dr. Ihor Dow and was put on Lysteda with instructions to call the office if the bleeding did not resolve. When she called today, she was sent to MAU. Bleeding was worse overnight, she soaked a super tampon and overnight pad in 57min. Bleeding has gotten lighter today, but she continues to have moderate bleeding with occasional small clots. Her cramping pain is 8/10, and she has a headache she rates 8/10. She hasn't taken anything for pain since 11pm last night.   OB History    Gravida  7   Para  5   Term  4   Preterm  1   AB  2   Living  5     SAB  2   TAB      Ectopic      Multiple      Live Births  5           Past Medical History:  Diagnosis Date  . Anemia   . Arthritis    chronic back pain   . Asthma   . Bipolar affective disorder ([place])   . Bronchitis   . Diabetes mellitus    Type 2  . Fibromyalgia   . Hx of migraines    occ  . Hypertension   . Hypothyroidism   . Obesity   . Thyroid disease     Past Surgical History:  Procedure Laterality Date  . CARPAL TUNNEL RELEASE Right   . CESAREAN SECTION     x2  . DILITATION & CURRETTAGE/HYSTROSCOPY WITH NOVASURE ABLATION N/A 12/07/2017   Procedure: DILATATION & CURETTAGE/HYSTEROSCOPY WITH ATTEMPED NOVASURE ABLATION, FAILED/ENDOMETERIAL ABLATION WITH HTA SYSTEM;  Surgeon: Lavonia Drafts, MD;  Location: [facility];  Service: Gynecology;  Laterality: N/A;  . ELBOW SURGERY     "surgery for nerve pain"  . GANGLION CYST EXCISION Right    right arm   . TONSILLECTOMY     childhood  . TUBAL LIGATION    . VAGINA SURGERY      fistula    Family History  Problem Relation Age of Onset  . Healthy Mother   . Healthy Father   . Breast cancer Neg Hx     Social History   Tobacco Use  . Smoking status: Current Every Day Smoker    Packs/day: 0.25    Types: Cigarettes  . Smokeless tobacco: Never Used  Substance Use Topics  . Alcohol use: Yes    Comment: occasionally  . Drug use: Yes    Types: Marijuana    Comment: last used 07/2018    Allergies:  Allergies  Allergen Reactions  . Penicillins Other (See Comments)    Unknown; from childhood Has patient had a PCN reaction causing immediate rash, facial/tongue/throat swelling, SOB or lightheadedness with hypotension: Unknow Has patient had a PCN reaction causing severe rash involving mucus membranes or skin necrosis: Unknown Has patient had a PCN reaction that required hospitalization: Unknown Has patient had a PCN reaction occurring within the last 10 years: Unknown If all of the above answers are "NO", then may  proceed with Cephalosporin use.     Medications Prior to Admission  Medication Sig Dispense Refill Last Dose  . albuterol (PROVENTIL HFA;VENTOLIN HFA) 108 (90 Base) MCG/ACT inhaler Inhale 1-2 puffs into the lungs every 6 (six) hours as needed for wheezing.   Taking  . amLODipine (NORVASC) 5 MG tablet Take 5 mg by mouth daily.  3 Taking  . BELBUCA 75 MCG FILM Take 1 Film by mouth every 12 (twelve) hours.   0 Not Taking  . budesonide-formoterol (SYMBICORT) 160-4.5 MCG/ACT inhaler Inhale 2 puffs into the lungs 2 (two) times daily.     . cetirizine (ZYRTEC) 10 MG tablet Take 10 mg by mouth daily.   Not Taking  . chlorhexidine (PERIDEX) 0.12 % solution Use as directed 15 mLs in the mouth or throat 2 (two) times daily.     . clotrimazole (GYNE-LOTRIMIN) 1 % vaginal cream Apply to affected area bid until clear (Patient not taking: Reported on 04/12/2018) 120 g 1 Not Taking  . diclofenac (CATAFLAM) 50 MG tablet Take 1 tablet (50 mg total) by mouth 3  (three) times daily. 40 tablet 3 Taking  . diclofenac sodium (VOLTAREN) 1 % GEL Apply 2 g topically 4 (four) times daily. 1 Tube 0 Taking  . fluticasone (FLONASE) 50 MCG/ACT nasal spray Place 1-2 sprays into both nostrils daily for 7 days. 1 g 0   . glucose blood test strip Use as instructed 100 each 12   . HYDROcodone-homatropine (HYCODAN) 5-1.5 MG/5ML syrup Take 5 mLs by mouth every 6 (six) hours as needed for cough. (Patient not taking: Reported on 06/23/2018) 75 mL 0 Not Taking  . ibuprofen (ADVIL,MOTRIN) 600 MG tablet Take 1 tablet (600 mg total) by mouth every 6 (six) hours as needed. 30 tablet 0 Taking  . ipratropium (ATROVENT) 0.03 % nasal spray Place 2 sprays into both nostrils 2 (two) times daily.     Marland Kitchen liraglutide (VICTOZA) 18 MG/3ML SOPN Inject 1.8 mg into the skin daily.     . metFORMIN (GLUCOPHAGE) 500 MG tablet Take 500 mg by mouth 2 (two) times daily with a meal.     . norethindrone (AYGESTIN) 5 MG tablet Take 2 tablets (10 mg total) by mouth daily. 60 tablet 2   . omeprazole (PRILOSEC) 20 MG capsule Take 20 mg by mouth 2 (two) times daily before a meal.     . predniSONE (DELTASONE) 50 MG tablet Take 1 tablet (50 mg total) by mouth daily. (Patient not taking: Reported on 06/23/2018) 5 tablet 0 Not Taking  . tranexamic acid (LYSTEDA) 650 MG TABS tablet Take 2 tablets (1,300 mg total) by mouth 3 (three) times daily. Take during menses for a maximum of five days 30 tablet 2   . triamterene-hydrochlorothiazide (MAXZIDE-25) 37.5-25 MG tablet Take 1 tablet by mouth daily.  3 Not Taking    Review of Systems  Constitutional: Positive for activity change ("I don't feel like doing anything because I'm always bleeding.:) and fatigue. Negative for fever.  HENT: Negative.   Eyes: Negative.   Respiratory: Negative.  Negative for chest tightness and shortness of breath.   Cardiovascular: Negative.  Negative for chest pain and palpitations.  Gastrointestinal: Negative for abdominal distention,  abdominal pain, constipation, diarrhea, nausea and vomiting.  Endocrine: Negative.   Genitourinary: Positive for pelvic pain and vaginal bleeding.  Musculoskeletal: Negative.   Skin: Positive for rash (hives seen on both hands and right wrist).  Allergic/Immunologic: Negative for environmental allergies and food allergies.  Neurological: Positive for  headaches. Negative for dizziness, weakness and light-headedness.  Hematological: Negative.   Psychiatric/Behavioral: Negative.    Physical Exam   Blood pressure (!) 133/93, pulse 88, temperature 97.9 F (36.6 C), temperature source Oral, resp. rate 20, weight (!) 138.2 kg, SpO2 99 %.  Physical Exam  Nursing note and vitals reviewed. Constitutional: She is oriented to person, place, and time. She appears well-developed and well-nourished.  HENT:  Head: Normocephalic.  Eyes: Pupils are equal, round, and reactive to light.  Neck: Normal range of motion.  Cardiovascular: Normal rate and regular rhythm.  Respiratory: Effort normal.  GI: Soft. She exhibits no distension. There is no abdominal tenderness.  Genitourinary:    Genitourinary Comments: Vaginal bleeding present, small-moderate amount of bright red blood with small clots.   Musculoskeletal: Normal range of motion.  Neurological: She is alert and oriented to person, place, and time.  Skin: Skin is warm and dry. Rash (Hives seen on hands and right wrist) noted. She is not diaphoretic.  Psychiatric: She has a normal mood and affect. Her behavior is normal. Judgment and thought content normal.    MAU Course  Procedures  MDM CBC (Hgb 11.9) Toradol dose given with relief of pain  Assessment and Plan  Abnormal uterine bleeding - Discharge to home - Follow up with Dr. Ihor Dow on 07/25/2018, can call office or send MyChart message if needed prior to appt.  Gabriel Carina, SNM 08/12/2018, 1:03 PM   I confirm that I have verified the information documented in the nurse  midwife student's note and that I have also personally reperformed the history, physical exam and all medical decision making activities of this service and have verified that all service and findings are accurately documented in this student's note.   Lajean Manes, CNM 08/12/2018

## 2018-08-25 ENCOUNTER — Ambulatory Visit (INDEPENDENT_AMBULATORY_CARE_PROVIDER_SITE_OTHER): Payer: Medicaid Other | Admitting: Obstetrics & Gynecology

## 2018-08-25 ENCOUNTER — Encounter: Payer: Self-pay | Admitting: Obstetrics & Gynecology

## 2018-08-25 VITALS — BP 139/99 | HR 92 | Ht 66.0 in | Wt 301.0 lb

## 2018-08-25 DIAGNOSIS — N939 Abnormal uterine and vaginal bleeding, unspecified: Secondary | ICD-10-CM

## 2018-08-25 NOTE — Progress Notes (Signed)
History:  43 y.o. [id number] here today for eval of AUB. Pt reports that the bleeding has stopped. Pt reports that her glc is still in the 200 range. She is on insulin and Metformin.   Pt was last seen for DM mangement month prev. Pt is currently off all meds. Pt reports that she was on the Norethindrone but, she had a heavy period she took the TXA but, she was already bleeding. She didn't notice a difference in the amount of bleeding.  She reports that she had 'steroids at home' that she took and that helped with the bleeding.     The following portions of the patient's history were reviewed and updated as appropriate: allergies, current medications, past family history, past medical history, past social history, past surgical history and problem list.  Review of Systems:  Pertinent items are noted in HPI.    Objective:  Physical Exam BP (!) 139/99   Pulse 92   Ht 5\' 6"  (1.676 m)   Wt (!) 301 lb (136.5 kg)   BMI 48.58 kg/m   CONSTITUTIONAL: Well-developed, well-nourished female in no acute distress.  HENT:  Normocephalic, atraumatic EYES: Conjunctivae and EOM are normal. No scleral icterus.  NECK: Normal range of motion SKIN: Skin is warm and dry. No rash noted. Not diaphoretic.No pallor. [place]: Alert and oriented to person, place, and time. Normal coordination.   Pelvic: deferred   Assessment & Plan:  AUB improved at present. Pt is on hold for hysterectomy until DM and HTN is better managed.    Keep TXA 1300mg  bid with heavy bleeding.  Pt is not to tke for greater  than 5 days HTN  BP elevated tdoay    Rec that pt transfer to a site where she can get her DM better managed with closer follow up. Transfer to [place] or [place]. I have stressed with pt the importance of compliance with prescribed treatement program to get her DM better managed.     Total face-to-face time with patient was 20 min.  Greater than 50% was spent in counseling and coordination of care with the  patient.     Hurschel Paynter L. Harraway-Smith, M.D., FACOG   l

## 2018-09-13 ENCOUNTER — Encounter: Payer: Self-pay | Admitting: Family Medicine

## 2018-09-16 ENCOUNTER — Encounter: Payer: Self-pay | Admitting: Family Medicine

## 2018-09-16 ENCOUNTER — Other Ambulatory Visit: Payer: Self-pay

## 2018-09-16 ENCOUNTER — Ambulatory Visit (INDEPENDENT_AMBULATORY_CARE_PROVIDER_SITE_OTHER): Payer: Medicaid Other | Admitting: Family Medicine

## 2018-09-16 VITALS — BP 133/78 | HR 83 | Temp 98.1°F | Resp 17 | Ht 66.0 in | Wt 302.0 lb

## 2018-09-16 DIAGNOSIS — E119 Type 2 diabetes mellitus without complications: Secondary | ICD-10-CM | POA: Diagnosis not present

## 2018-09-16 DIAGNOSIS — I10 Essential (primary) hypertension: Secondary | ICD-10-CM

## 2018-09-16 DIAGNOSIS — R5383 Other fatigue: Secondary | ICD-10-CM

## 2018-09-16 DIAGNOSIS — E1165 Type 2 diabetes mellitus with hyperglycemia: Secondary | ICD-10-CM

## 2018-09-16 DIAGNOSIS — Z7689 Persons encountering health services in other specified circumstances: Secondary | ICD-10-CM

## 2018-09-16 DIAGNOSIS — F17210 Nicotine dependence, cigarettes, uncomplicated: Secondary | ICD-10-CM

## 2018-09-16 DIAGNOSIS — Z6841 Body Mass Index (BMI) 40.0 and over, adult: Secondary | ICD-10-CM | POA: Diagnosis not present

## 2018-09-16 MED ORDER — GLIPIZIDE ER 10 MG PO TB24: 10.0000 mg | ORAL_TABLET | Freq: Every day | ORAL | 1 refills | Status: DC

## 2018-09-16 MED ORDER — METFORMIN HCL 1000 MG PO TABS: 1000.0000 mg | ORAL_TABLET | Freq: Two times a day (BID) | ORAL | 2 refills | Status: DC

## 2018-09-16 MED ORDER — DOXYCYCLINE HYCLATE 100 MG PO CAPS: 100.0000 mg | ORAL_CAPSULE | Freq: Two times a day (BID) | ORAL | 0 refills | Status: DC

## 2018-09-16 MED ORDER — AMLODIPINE BESYLATE 5 MG PO TABS: 5.0000 mg | ORAL_TABLET | Freq: Every day | ORAL | 3 refills | Status: DC

## 2018-09-16 MED ORDER — INSULIN DETEMIR 100 UNIT/ML ~~LOC~~ SOLN: 20.0000 [IU] | Freq: Two times a day (BID) | SUBCUTANEOUS | 1 refills | Status: DC

## 2018-09-16 NOTE — Patient Instructions (Addendum)
Thank you for choosing Primary Care at [facility] to be your medical home!    Ruth [name] was seen by Molli Barrows, FNP today.   Caryl Ada Krah's primary care provider is Scot Jun, FNP.   For the best care possible, you should try to see Molli Barrows, FNP-C whenever you come to the clinic.   We look forward to seeing you again soon!  If you have any questions about your visit today, please call us at [phone] or feel free to reach your primary care provider via [place].      Diabetes Mellitus and Nutrition, Adult When you have diabetes (diabetes mellitus), it is very important to have healthy eating habits because your blood sugar (glucose) levels are greatly affected by what you eat and drink. Eating healthy foods in the appropriate amounts, at about the same times every day, can help you:  Control your blood glucose.  Lower your risk of heart disease.  Improve your blood pressure.  Reach or maintain a healthy weight. Every person with diabetes is different, and each person has different needs for a meal plan. Your health care provider may recommend that you work with a diet and nutrition specialist (dietitian) to make a meal plan that is best for you. Your meal plan may vary depending on factors such as:  The calories you need.  The medicines you take.  Your weight.  Your blood glucose, blood pressure, and cholesterol levels.  Your activity level.  Other health conditions you have, such as heart or kidney disease. How do carbohydrates affect me? Carbohydrates, also called carbs, affect your blood glucose level more than any other type of food. Eating carbs naturally raises the amount of glucose in your blood. Carb counting is a method for keeping track of how many carbs you eat. Counting carbs is important to keep your blood glucose at a healthy level, especially if you use insulin or take certain oral diabetes medicines. It is important to  know how many carbs you can safely have in each meal. This is different for every person. Your dietitian can help you calculate how many carbs you should have at each meal and for each snack. Foods that contain carbs include:  Bread, cereal, rice, pasta, and crackers.  Potatoes and corn.  Peas, beans, and lentils.  Milk and yogurt.  Fruit and juice.  Desserts, such as cakes, cookies, ice cream, and candy. How does alcohol affect me? Alcohol can cause a sudden decrease in blood glucose (hypoglycemia), especially if you use insulin or take certain oral diabetes medicines. Hypoglycemia can be a life-threatening condition. Symptoms of hypoglycemia (sleepiness, dizziness, and confusion) are similar to symptoms of having too much alcohol. If your health care provider says that alcohol is safe for you, follow these guidelines:  Limit alcohol intake to no more than 1 drink per day for nonpregnant women and 2 drinks per day for men. One drink equals 12 oz of beer, 5 oz of wine, or 1 oz of hard liquor.  Do not drink on an empty stomach.  Keep yourself hydrated with water, diet soda, or unsweetened iced tea.  Keep in mind that regular soda, juice, and other mixers may contain a lot of sugar and must be counted as carbs. What are tips for following this plan?  Reading food labels  Start by checking the serving size on the "Nutrition Facts" label of packaged foods and drinks. The amount of calories, carbs, fats, and other nutrients  listed on the label is based on one serving of the item. Many items contain more than one serving per package.  Check the total grams (g) of carbs in one serving. You can calculate the number of servings of carbs in one serving by dividing the total carbs by 15. For example, if a food has 30 g of total carbs, it would be equal to 2 servings of carbs.  Check the number of grams (g) of saturated and trans fats in one serving. Choose foods that have low or no amount of  these fats.  Check the number of milligrams (mg) of salt (sodium) in one serving. Most people should limit total sodium intake to less than 2,300 mg per day.  Always check the nutrition information of foods labeled as "low-fat" or "nonfat". These foods may be higher in added sugar or refined carbs and should be avoided.  Talk to your dietitian to identify your daily goals for nutrients listed on the label. Shopping  Avoid buying canned, premade, or processed foods. These foods tend to be high in fat, sodium, and added sugar.  Shop around the outside edge of the grocery store. This includes fresh fruits and vegetables, bulk grains, fresh meats, and fresh dairy. Cooking  Use low-heat cooking methods, such as baking, instead of high-heat cooking methods like deep frying.  Cook using healthy oils, such as olive, canola, or sunflower oil.  Avoid cooking with butter, cream, or high-fat meats. Meal planning  Eat meals and snacks regularly, preferably at the same times every day. Avoid going long periods of time without eating.  Eat foods high in fiber, such as fresh fruits, vegetables, beans, and whole grains. Talk to your dietitian about how many servings of carbs you can eat at each meal.  Eat 4-6 ounces (oz) of lean protein each day, such as lean meat, chicken, fish, eggs, or tofu. One oz of lean protein is equal to: ? 1 oz of meat, chicken, or fish. ? 1 egg. ?  cup of tofu.  Eat some foods each day that contain healthy fats, such as avocado, nuts, seeds, and fish. Lifestyle  Check your blood glucose regularly.  Exercise regularly as told by your health care provider. This may include: ? 150 minutes of moderate-intensity or vigorous-intensity exercise each week. This could be brisk walking, biking, or water aerobics. ? Stretching and doing strength exercises, such as yoga or weightlifting, at least 2 times a week.  Take medicines as told by your health care provider.  Do not use  any products that contain nicotine or tobacco, such as cigarettes and e-cigarettes. If you need help quitting, ask your health care provider.  Work with a Social worker or diabetes educator to identify strategies to manage stress and any emotional and social challenges. Questions to ask a health care provider  Do I need to meet with a diabetes educator?  Do I need to meet with a dietitian?  What number can I call if I have questions?  When are the best times to check my blood glucose? Where to find more information:  American Diabetes Association: diabetes.org  Academy of Nutrition and Dietetics: www.eatright.[employer] of Diabetes and Digestive and Kidney Diseases (NIH): [employer] Summary  A healthy meal plan will help you control your blood glucose and maintain a healthy lifestyle.  Working with a diet and nutrition specialist (dietitian) can help you make a meal plan that is best for you.  Keep in mind that carbohydrates (carbs)  and alcohol have immediate effects on your blood glucose levels. It is important to count carbs and to use alcohol carefully. This information is not intended to replace advice given to you by your health care provider. Make sure you discuss any questions you have with your health care provider. Document Released: 03/19/2005 Document Revised: 01/20/2017 Document Reviewed: 07/27/2016 Elsevier Interactive Patient Education  2019 [employer].

## 2018-09-16 NOTE — Progress Notes (Signed)
Ruth [name], is a 43 y.o. female  ZOX:[id number]  [id number]:[id number]  DOB - 1975-11-25  CC:  Chief Complaint  Patient presents with  . Establish Care  . Diabetes    CBG 274  . Hypertension       HPI: Ruth [name] is a 43 y.o. female is here today to establish care, diabetes and hypertension management.  Ruth [name] has HYPOTHYROIDISM; DM; EXOGENOUS OBESITY; BIPOLAR AFFECTIVE DISORDER; PERSISTENT DISORDER INITIATING/MAINTAINING SLEEP; INADEQUATE SLEEP HYGIENE; OBSTRUCTIVE SLEEP APNEA; CARPAL TUNNEL SYNDROME; MERALGIA PARESTHETICA; HYPERTENSION; FIBROMYALGIA; CEPHALGIA; Abnormal mammogram of both breasts; and Abnormal uterine bleeding (AUB) on their problem list.    Diabetes / Hypertension Ruth [name]  Ruth [name] , current daily smoker, was previously followed by a provider at [facility]. She was last seen in early January. She brings her medications with her to today's office visit. Her current diabetes regimen include: Vicotoza, metformin, and Levemir. Last A1C per EMR 11.7. Previous PCP recently added Victoza in January and patient reports she stopped medication after a few weeks due to GI symptoms.  Sugar readings at home have remained in the 200s.  Also suffers from hydradenitis and reports since her blood sugars have been running high she has had an abscess in her axilla draining and has remained unhealed.  Patient sedentary admits to a poor diet. Her current BMI Body mass index is 48.74 kg/m.  Reports that she was diagnosed with diabetes about a year and a half ago and her A1c was 9.4.  She also suffered from gestational diabetes which resolved after the birth of her daughter.  She reports that her daughter has type 2 diabetes. Ruth [name] has hypertension. No routine monitoring of blood pressure at home.  Current blood pressure medications include amlodipine 5 mg and Maxide 37.5-25 mg. She denies headaches, dizziness, chest pain, dizziness, or shortness of breath. Denies adherence to  DASH diet. She endorses fatigue.   Current medications: Current Outpatient Medications:  .  Accu-Chek FastClix Lancets MISC, E1 1.65, FOUR BLOOD GLUCOSE TESTING FASTING ANND TWO HOURS AFTER SUPPER, Disp: , Rfl:  .  albuterol (ACCUNEB) 0.63 MG/3ML nebulizer solution, USE 1 VIAL VIA NEBULIZER EVERY 4-6 HOURS AS NEEDED, Disp: , Rfl:  .  albuterol (PROVENTIL HFA;VENTOLIN HFA) 108 (90 Base) MCG/ACT inhaler, Inhale 1-2 puffs into the lungs every 6 (six) hours as needed for wheezing., Disp: , Rfl:  .  Blood Glucose Monitoring Suppl (ACCU-CHEK GUIDE) w/Device KIT, USE TO CHECK BLOOD SUGAR TWICE A DAY E11.65, Disp: , Rfl: 0 .  budesonide-formoterol (SYMBICORT) 160-4.5 MCG/ACT inhaler, Inhale 2 puffs into the lungs 2 (two) times daily., Disp: , Rfl:  .  cetirizine (ZYRTEC) 10 MG tablet, Take 10 mg by mouth daily., Disp: , Rfl:  .  ibuprofen (ADVIL,MOTRIN) 800 MG tablet, TAKE 1 TABLET BY MOUTH TWICE A DAY TO 3 TIMES A DAY AS NEEDED FOR INFALMATION AND PAIN, Disp: , Rfl:  .  insulin detemir (LEVEMIR) 100 UNIT/ML injection, Inject 15 Units into the skin 2 (two) times daily., Disp: , Rfl:  .  metFORMIN (GLUCOPHAGE) 500 MG tablet, Take 500 mg by mouth 2 (two) times daily with a meal., Disp: , Rfl:  .  NOVOFINE 32G X 6 MM MISC, USE WITH VICTOZA INJECTION EACH DAY, Disp: , Rfl:  .  omeprazole (PRILOSEC) 20 MG capsule, Take 20 mg by mouth 2 (two) times daily before a meal., Disp: , Rfl:  .  triamterene-hydrochlorothiazide (MAXZIDE-25) 37.5-25 MG tablet, Take 1 tablet by mouth daily., Disp: , Rfl: 3 .  amLODipine (NORVASC) 5 MG tablet, Take 5 mg by mouth daily., Disp: , Rfl: 3   Pertinent family medical history: family history includes Diabetes in her daughter and son; Thyroid disease in her mother.   Social History   Socioeconomic History  . Marital status: Married    Spouse name: Not on file  . Number of children: Not on file  . Years of education: Not on file  . Highest education level: Not on file   Occupational History  . Not on file  Social Needs  . Financial resource strain: Not on file  . Food insecurity:    Worry: Not on file    Inability: Not on file  . Transportation needs:    Medical: Not on file    Non-medical: Not on file  Tobacco Use  . Smoking status: Current Every Day Smoker    Packs/day: 0.25    Types: Cigarettes  . Smokeless tobacco: Never Used  Substance and Sexual Activity  . Alcohol use: Yes    Comment: occasionally  . Drug use: Yes    Types: Marijuana    Comment: last used 07/2018  . Sexual activity: Yes    Birth control/protection: Surgical  Lifestyle  . Physical activity:    Days per week: Not on file    Minutes per session: Not on file  . Stress: Not on file  Relationships  . Social connections:    Talks on phone: Not on file    Gets together: Not on file    Attends religious service: Not on file    Active member of club or organization: Not on file    Attends meetings of clubs or organizations: Not on file    Relationship status: Not on file  . Intimate partner violence:    Fear of current or ex partner: Not on file    Emotionally abused: Not on file    Physically abused: Not on file    Forced sexual activity: Not on file  Other Topics Concern  . Not on file  Social History Narrative  . Not on file    Review of Systems: Pertinent negatives listed in HPI Objective:   Vitals:   09/16/18 0954  BP: 133/78  Pulse: 83  Resp: 17  Temp: 98.1 F (36.7 C)  SpO2: 96%    BP Readings from Last 3 Encounters:  09/16/18 133/78  08/25/18 (!) 139/99  08/12/18 (!) 164/101    Filed Weights   09/16/18 0954  Weight: (!) 302 lb (137 kg)      Physical Exam: Constitutional: Patient appears well-developed and well-nourished. No distress. HENT: Normocephalic, atraumatic, External right and left ear normal.  Eyes: Conjunctivae and EOM are normal. PERRLA, no scleral icterus. Neck: Normal ROM. Neck supple. No JVD. No tracheal deviation. No  thyromegaly. CVS: RRR, S1/S2 +, no murmurs, no gallops, no carotid bruit.  Pulmonary: Effort and breath sounds normal, no stridor, rhonchi, wheezes, rales.  Abdominal: Soft. BS +, no distension, tenderness, rebound or guarding.  Musculoskeletal: Normal range of motion. No edema and no tenderness.  Neuro: Alert. Normal muscle tone coordination. Normal gait.  Skin: Skin is warm and dry. Left axilla lesion, erythema without exudate. Psychiatric: Normal mood and affect. Behavior, judgment, thought content normal.  Lab Results (prior encounters)  Lab Results  Component Value Date   WBC 6.2 08/12/2018   HGB 11.9 (L) 08/12/2018   HCT 37.3 08/12/2018   MCV 81.3 08/12/2018   PLT 435 (H) 08/12/2018   Lab  Results  Component Value Date   CREATININE 0.89 12/03/2017   BUN 11 12/03/2017   NA 139 12/03/2017   K 4.3 12/03/2017   CL 105 12/03/2017   CO2 24 12/03/2017    Lab Results  Component Value Date   HGBA1C 11.7 (H) 06/23/2018    No results found for: CHOL, TRIG, HDL, CHOLHDL, VLDL, LDLCALC      Assessment and plan:  1. Encounter to establish care  2. Type 2 diabetes mellitus without complication, unspecified whether long term insulin use (HCC) , hyperglycemic today. Hasn't administered insulin today. Poor diet and diabetes knowledge, referring patient to diabetes education to facilitate better dietary choices and hopefully improve glycemic control. Unfortunately patient was unable to tolerate Victoza.Increase Lantus to 20 units twice daily, metformin 1000 times daily with meals, and glipizide 10 mg daily with breakfast. Repeating an A1C and will obtain Microalbumin/Creatinine Ratio  3. Essential hypertension, controlled We have discussed target BP range and blood pressure goal. I have advised patient to check BP regularly and to call us back or report to clinic if the numbers are consistently higher than 140/90. We discussed the importance of compliance with medical therapy and DASH  diet recommended, consequences of uncontrolled hypertension discussed.  -Continue amlodipine and Maxide.   4. Morbid obesity ([place]) Body mass index is 48.74 kg/m.  Encouraged efforts to reduce weight include engaging in physical activity as tolerated with goal of 150 minutes per week. Improve dietary choices and eat a meal regimen consistent with a Mediterranean or DASH diet. Reduce simple carbohydrates. Do not skip meals and eat healthy snacks throughout the day to avoid over-eating at dinner. Set a goal weight loss that is achievable for you.  5. Fatigue, unspecified type Likely secondary to poor glycemic control and sedentary lifestyle.     A total of 40 minutes spent, greater than 50 % of this time was spent counseling and coordination of care.  Return in about 6 weeks (around 10/28/2018) for Diabetes .   The patient was given clear instructions to go to ER or return to medical center if symptoms don't improve, worsen or new problems develop. The patient verbalized understanding. The patient was advised  to call and obtain lab results if they haven't heard anything from out office within 7-10 business days.  Molli Barrows, FNP Primary Care at [facility] [address], [place] [address] [phone]: [phone]   This note has been created with Dragon speech recognition software and [employer]er, materials Any transcriptional errors are unintentional.

## 2018-09-17 LAB — HEMOGLOBIN A1C
ESTIMATED AVERAGE GLUCOSE: 295 mg/dL
Hgb A1c MFr Bld: 11.9 % — ABNORMAL HIGH (ref 4.8–5.6)

## 2018-09-17 LAB — MICROALBUMIN / CREATININE URINE RATIO
Creatinine, Urine: 92.3 mg/dL
Microalb/Creat Ratio: 8 mg/g creat (ref 0–29)
Microalbumin, Urine: 7.6 ug/mL

## 2018-09-21 LAB — GLUCOSE, POCT (MANUAL RESULT ENTRY): POC Glucose: 274 mg/dl — AB (ref 70–99)

## 2018-10-05 ENCOUNTER — Ambulatory Visit: Payer: Medicaid Other | Admitting: *Deleted

## 2018-10-24 ENCOUNTER — Other Ambulatory Visit: Payer: Self-pay

## 2018-10-24 ENCOUNTER — Ambulatory Visit (INDEPENDENT_AMBULATORY_CARE_PROVIDER_SITE_OTHER): Payer: Medicaid Other | Admitting: Family Medicine

## 2018-10-24 DIAGNOSIS — J302 Other seasonal allergic rhinitis: Secondary | ICD-10-CM

## 2018-10-24 DIAGNOSIS — I10 Essential (primary) hypertension: Secondary | ICD-10-CM

## 2018-10-24 DIAGNOSIS — M546 Pain in thoracic spine: Secondary | ICD-10-CM | POA: Diagnosis not present

## 2018-10-24 DIAGNOSIS — J45909 Unspecified asthma, uncomplicated: Secondary | ICD-10-CM | POA: Diagnosis not present

## 2018-10-24 DIAGNOSIS — G8929 Other chronic pain: Secondary | ICD-10-CM | POA: Diagnosis not present

## 2018-10-24 DIAGNOSIS — E119 Type 2 diabetes mellitus without complications: Secondary | ICD-10-CM

## 2018-10-24 MED ORDER — INSULIN DETEMIR 100 UNIT/ML FLEXPEN: 20.0000 [IU] | PEN_INJECTOR | Freq: Every day | SUBCUTANEOUS | 11 refills | Status: DC

## 2018-10-24 MED ORDER — ALBUTEROL SULFATE 0.63 MG/3ML IN NEBU: INHALATION_SOLUTION | RESPIRATORY_TRACT | 3 refills | Status: DC

## 2018-10-24 MED ORDER — NAPROXEN 500 MG PO TABS: 500.0000 mg | ORAL_TABLET | Freq: Two times a day (BID) | ORAL | 1 refills | Status: DC

## 2018-10-24 MED ORDER — PEN NEEDLES 32G X 4 MM MISC: 1.0000 | Freq: Two times a day (BID) | 11 refills | Status: DC

## 2018-10-24 MED ORDER — GLIPIZIDE ER 10 MG PO TB24: 10.0000 mg | ORAL_TABLET | Freq: Every day | ORAL | 1 refills | Status: DC

## 2018-10-24 MED ORDER — METFORMIN HCL 1000 MG PO TABS: 1000.0000 mg | ORAL_TABLET | Freq: Two times a day (BID) | ORAL | 5 refills | Status: DC

## 2018-10-24 MED ORDER — FLUTICASONE PROPIONATE 50 MCG/ACT NA SUSP: 2.0000 | Freq: Every day | NASAL | 12 refills | Status: DC

## 2018-10-24 MED ORDER — CETIRIZINE HCL 10 MG PO TABS: 10.0000 mg | ORAL_TABLET | Freq: Every day | ORAL | 11 refills | Status: DC

## 2018-10-24 MED ORDER — CYCLOBENZAPRINE HCL 5 MG PO TABS: 5.0000 mg | ORAL_TABLET | Freq: Three times a day (TID) | ORAL | 1 refills | Status: DC | PRN

## 2018-10-24 MED ORDER — INSULIN DETEMIR 100 UNIT/ML FLEXPEN: 20.0000 [IU] | Freq: Two times a day (BID) | SUBCUTANEOUS | 6 refills | Status: DC

## 2018-10-24 MED ORDER — ALBUTEROL SULFATE HFA 108 (90 BASE) MCG/ACT IN AERS: 1.0000 | INHALATION_SPRAY | Freq: Four times a day (QID) | RESPIRATORY_TRACT | 3 refills | Status: DC | PRN

## 2018-10-24 NOTE — Progress Notes (Deleted)
Called patient to initiate their telephone visit with provider Molli Barrows, FNP-C. Verified date of birth. States that FSBS have been running in the 120s-150s. Highest has been in the lower 200s. She has c/o of bad diarrhea when she takes all 3 of her diabetic medications. KWalker, CMA.

## 2018-10-24 NOTE — Progress Notes (Signed)
Virtual Visit via Telephone Note  I connected with Ruth [name] on 10/24/18 at 10:50 AM EDT by telephone and verified that I am speaking with the correct person using two identifiers.  Provider located at primary care office during today's encounter.  I discussed the limitations, risks, security and privacy concerns of performing an evaluation and management service by telephone and the availability of in person appointments. I also discussed with the patient that there may be a patient responsible charge related to this service. The patient expressed understanding and agreed to proceed.  History of Present Illness: Medical problems include HYPOTHYROIDISM; DM; EXOGENOUS OBESITY; BIPOLAR AFFECTIVE DISORDER; PERSISTENT DISORDER INITIATING/MAINTAINING SLEEP; INADEQUATE SLEEP HYGIENE; OBSTRUCTIVE SLEEP APNEA; CARPAL TUNNEL SYNDROME; MERALGIA PARESTHETICA; HYPERTENSION; FIBROMYALGIA; CEPHALGIA; Abnormal mammogram of both breasts; and Abnormal uterine bleeding (AUB).  Diabetes  Ruth [name] established care as a new patient on 09/16/18. She suffers from very poorly controlled diabetes with most recent A1C 11.9. Medication adjustments were made during her last office visit to include increasing Metformin 1,000 mg twice daily, adding glipizide, and increasing Levemir 20 units daily. She reports compliance with new regimen. Only complaint is increased frequency of stools with the increased dose of Metformin. Blood sugars have ranged in 140-180's. Reports possible a few readings in the low 200's although these increased readings are not occurring daily as previously.  Denies 3 P's.   Hypertension  No home monitoring of blood pressure. Current medication regimen includes Amlodipine and Maxide.Last BP reading on 138/77, 09/16/18 visit. She is asymptomatic of dizziness, headaches, chest pain, or weakness. Endorses occasional shortness of breath, however attributes this to chronic smoking and asthma.  Needs  refill on asthma, allergy, diabetes, and back pain medications.  Assessment and Plan: 1. Type 2 diabetes mellitus without complication, unspecified whether long term insulin use ([place]), uncontrolled, last A1C 11.9 We discussed goal for blood sugars to be less than 150 consistently, non-fasting. We discussed goal A1C goal <7 Agreed to back off metformin 1000 mg once daily.  Continue glipizide 10 mg ER once daily (will increase dose to 20 mg if A1C not improving) Continue Levemir 20 units once daily. Repeat A1C 6-8 weeks  Reemphasized the importance of diet and exercise in efforts to loose weight   2. Essential hypertension Recent in office reading were controlled. No medication changes Encouraged smoking cessation   3. Morbid obesity ([place]) Encouraged efforts to reduce weight include engaging in physical activity as tolerated with goal of 150 minutes per week. Improve dietary choices and eat a meal regimen consistent with a Mediterranean or DASH diet. Reduce simple carbohydrates. Do not skip meals and eat healthy snacks throughout the day to avoid over-eating at dinner. Set a goal weight loss that is achievable for you.  4. Seasonal allergies -Refilled Flonase and Cetrizine   5. Reactive airway disease without complication, unspecified asthma severity, unspecified whether persistent -Refilled albuterol inhaler and nebulizer medications  -Encouraged smoking cessation   Follow Up Instructions: Return for fasting labs including an A1C 6-7 weeks from today.   I discussed the assessment and treatment plan with the patient. The patient was provided an opportunity to ask questions and all were answered. The patient agreed with the plan and demonstrated an understanding of the instructions.   The patient was advised to call back or seek an in-person evaluation if the symptoms worsen or if the condition fails to improve as anticipated.  I provided 20 minutes of non-face-to-face time during this  encounter.   Molli Barrows, FNP

## 2018-10-28 ENCOUNTER — Ambulatory Visit: Payer: Medicaid Other | Admitting: Family Medicine

## 2018-11-01 DIAGNOSIS — H1089 Other conjunctivitis: Secondary | ICD-10-CM | POA: Diagnosis not present

## 2018-11-07 DIAGNOSIS — J411 Mucopurulent chronic bronchitis: Secondary | ICD-10-CM | POA: Diagnosis not present

## 2018-11-07 DIAGNOSIS — J00 Acute nasopharyngitis [common cold]: Secondary | ICD-10-CM | POA: Diagnosis not present

## 2018-12-15 ENCOUNTER — Telehealth: Payer: Self-pay

## 2018-12-15 NOTE — Telephone Encounter (Signed)
This is a known GI problem and is not related to COVID symptoms

## 2018-12-15 NOTE — Telephone Encounter (Signed)
Called patient to do their pre-visit COVID screening.  Have you recently traveled internationally([place], [place], [place], [place], [place]) or within the [place] to a hotspot area([place], [place], [place], [place], [place])? no  Are you currently experiencing any of the following: fever, cough, SHOB, fatigue, body aches, loss of smell, rash, diarrhea, vomiting, severe headaches, weakness, sore throat? States that she has had diarrhea since starting Metformin. No other symptoms  Have you been in contact with anyone who has recently travelled? no  Have you been in contact with anyone who is experiencing any of the above symptoms or been diagnosed with [place]  or works in or has recently visited a SNF? no

## 2018-12-19 ENCOUNTER — Encounter: Payer: Self-pay | Admitting: Family Medicine

## 2018-12-19 ENCOUNTER — Ambulatory Visit (INDEPENDENT_AMBULATORY_CARE_PROVIDER_SITE_OTHER): Payer: Medicaid Other | Admitting: Family Medicine

## 2018-12-19 ENCOUNTER — Other Ambulatory Visit: Payer: Self-pay

## 2018-12-19 VITALS — BP 149/92 | HR 98 | Temp 98.3°F | Resp 17 | Ht 66.0 in | Wt 304.0 lb

## 2018-12-19 DIAGNOSIS — E119 Type 2 diabetes mellitus without complications: Secondary | ICD-10-CM | POA: Diagnosis not present

## 2018-12-19 DIAGNOSIS — I10 Essential (primary) hypertension: Secondary | ICD-10-CM | POA: Diagnosis not present

## 2018-12-19 DIAGNOSIS — Z6841 Body Mass Index (BMI) 40.0 and over, adult: Secondary | ICD-10-CM | POA: Diagnosis not present

## 2018-12-19 DIAGNOSIS — R7989 Other specified abnormal findings of blood chemistry: Secondary | ICD-10-CM | POA: Diagnosis not present

## 2018-12-19 DIAGNOSIS — Z794 Long term (current) use of insulin: Secondary | ICD-10-CM | POA: Diagnosis not present

## 2018-12-19 DIAGNOSIS — E785 Hyperlipidemia, unspecified: Secondary | ICD-10-CM

## 2018-12-19 DIAGNOSIS — G4733 Obstructive sleep apnea (adult) (pediatric): Secondary | ICD-10-CM | POA: Diagnosis not present

## 2018-12-19 MED ORDER — INSULIN DETEMIR 100 UNIT/ML FLEXPEN: 20.0000 [IU] | PEN_INJECTOR | Freq: Two times a day (BID) | SUBCUTANEOUS | 11 refills | Status: DC

## 2018-12-19 MED ORDER — SITAGLIPTIN PHOSPHATE 50 MG PO TABS: 50.0000 mg | ORAL_TABLET | Freq: Every day | ORAL | 5 refills | Status: DC

## 2018-12-19 MED ORDER — ATORVASTATIN CALCIUM 10 MG PO TABS: 10.0000 mg | ORAL_TABLET | Freq: Every day | ORAL | 5 refills | Status: DC

## 2018-12-19 NOTE — Patient Instructions (Addendum)

## 2018-12-19 NOTE — Progress Notes (Signed)
Patient ID: Ruth [name], female    DOB: 02-26-1976, 43 y.o.   MRN: [id number]  PCP: Scot Jun, FNP  Chief Complaint  Patient presents with  . Diabetes  . Hypertension    Subjective:  HPI Ruth [name] is a 43 y.o. female, current smoker, presents for diabetes and hypertension follow-up.  Diabetes Ruth [name] monitors glucose at home. Last A1C 11.9. Current medication regimen includes glipizide, metformin, and Levemir. She reports today, stopping metformin back in April due to GI side effects. Blood sugars at have ranged between 170-220's fasting and non-fasting. No readings greater than 250 or low blood sugar readings. Denies 3 P's. No recent dietary changes. Unfortunately continues to smoke. Engages in no routine exercise and nor adheres to diabetes diet. Current Body mass index is 49.07 kg/m.  Hypertension   Elevated today. She has not taken medication today. BP routinely controlled with Maxide. Denies CP, SOB, edema, headaches or weakness.  Acid Reflux  Endorses recent acid reflux symptoms including indigestion and periodic dysphagia type symptoms.  Patient has been prescribed omeprazole 20 mg twice daily however has been taking as needed opposed to help was prescribed daily.  Patient endorses foods such as spicy foods, tomatoes, pizza, triggers for her acid reflux.  Patient also is a current everyday smoker.  Social History   Socioeconomic History  . Marital status: Married    Spouse name: Not on file  . Number of children: Not on file  . Years of education: Not on file  . Highest education level: Not on file  Occupational History  . Not on file  Social Needs  . Financial resource strain: Not on file  . Food insecurity    Worry: Not on file    Inability: Not on file  . Transportation needs    Medical: Not on file    Non-medical: Not on file  Tobacco Use  . Smoking status: Current Every Day Smoker    Packs/day: 0.25    Types: Cigarettes  .  Smokeless tobacco: Never Used  Substance and Sexual Activity  . Alcohol use: Yes    Comment: occasionally  . Drug use: Yes    Types: Marijuana    Comment: last used 07/2018  . Sexual activity: Yes    Birth control/protection: Surgical  Lifestyle  . Physical activity    Days per week: Not on file    Minutes per session: Not on file  . Stress: Not on file  Relationships  . Social Herbalist on phone: Not on file    Gets together: Not on file    Attends religious service: Not on file    Active member of club or organization: Not on file    Attends meetings of clubs or organizations: Not on file    Relationship status: Not on file  . Intimate partner violence    Fear of current or ex partner: Not on file    Emotionally abused: Not on file    Physically abused: Not on file    Forced sexual activity: Not on file  Other Topics Concern  . Not on file  Social History Narrative  . Not on file    Family History  Problem Relation Age of Onset  . Thyroid disease Mother   . Diabetes Daughter   . Diabetes Son   . Breast cancer Neg Hx    Review of Systems  Pertinent negatives listed in HPI  Patient Active Problem List  Diagnosis Date Noted  . Abnormal uterine bleeding (AUB) 12/07/2017  . Abnormal mammogram of both breasts 11/02/2016  . HYPOTHYROIDISM 06/21/2009  . DM 06/21/2009  . BIPOLAR AFFECTIVE DISORDER 06/21/2009  . 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 INADEQUATE SLEEP HYGIENE 06/21/2009  . OBSTRUCTIVE SLEEP APNEA 06/21/2009  . 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 CARPAL TUNNEL SYNDROME 06/20/2009  . MERALGIA PARESTHETICA 06/20/2009  . HYPERTENSION 06/20/2009  . FIBROMYALGIA 06/20/2009  . CEPHALGIA 06/20/2009    Allergies  Allergen Reactions  . Penicillins Other (See Comments)    Unknown; from childhood Has patient had a PCN reaction causing immediate rash, facial/tongue/throat swelling, SOB or lightheadedness with hypotension:  Unknow Has patient had a PCN reaction causing severe rash involving mucus membranes or skin necrosis: Unknown Has patient had a PCN reaction that required hospitalization: Unknown Has patient had a PCN reaction occurring within the last 10 years: Unknown If all of the above answers are "NO", then may proceed with Cephalosporin use.   [name] [Liraglutide]     Itching     Prior to Admission medications   Medication Sig Start Date End Date Taking? Authorizing Provider  Accu-Chek FastClix Lancets MISC E1 1.65, FOUR BLOOD GLUCOSE TESTING FASTING ANND TWO HOURS AFTER SUPPER 07/01/18  Yes [provider]  albuterol (ACCUNEB) 0.63 MG/3ML nebulizer solution USE 1 VIAL VIA NEBULIZER EVERY 4-6 HOURS AS NEEDED 10/24/18  Yes Scot Jun, FNP  albuterol (VENTOLIN HFA) 108 (90 Base) MCG/ACT inhaler Inhale 1-2 puffs into the lungs every 6 (six) hours as needed for wheezing. 10/24/18  Yes Scot Jun, FNP  amLODipine (NORVASC) 5 MG tablet Take 1 tablet (5 mg total) by mouth daily. 09/16/18  Yes Scot Jun, FNP  Blood Glucose Monitoring Suppl (ACCU-CHEK GUIDE) w/Device KIT USE TO CHECK BLOOD SUGAR TWICE A DAY E11.65 04/15/18  Yes [provider]  budesonide-formoterol (SYMBICORT) 160-4.5 MCG/ACT inhaler Inhale 2 puffs into the lungs 2 (two) times daily.   Yes [provider]  cetirizine (ZYRTEC) 10 MG tablet Take 1 tablet (10 mg total) by mouth daily. 10/24/18  Yes Scot Jun, FNP  cyclobenzaprine (FLEXERIL) 5 MG tablet Take 1 tablet (5 mg total) by mouth 3 (three) times daily as needed for muscle spasms. 10/24/18  Yes Scot Jun, FNP  fluticasone (FLONASE) 50 MCG/ACT nasal spray Place 2 sprays into both nostrils daily. 10/24/18  Yes Scot Jun, FNP  glipiZIDE (GLUCOTROL XL) 10 MG 24 hr tablet Take 1 tablet (10 mg total) by mouth daily with breakfast. 10/24/18  Yes Scot Jun, FNP  Insulin Detemir (LEVEMIR) 100 UNIT/ML Pen Inject 20  Units into the skin daily. 10/24/18  Yes Scot Jun, FNP  Insulin Pen Needle (PEN NEEDLES) 32G X 4 MM MISC 1 each by Does not apply route 2 (two) times daily. 10/24/18  Yes Scot Jun, FNP  metFORMIN (GLUCOPHAGE) 1000 MG tablet Take 1 tablet (1,000 mg total) by mouth 2 (two) times daily with a meal. 10/24/18  Yes Scot Jun, FNP  naproxen (NAPROSYN) 500 MG tablet Take 1 tablet (500 mg total) by mouth 2 (two) times daily with a meal. 10/24/18  Yes Scot Jun, FNP  NOVOFINE 32G X 6 MM [place] DAY 06/08/18  Yes [provider]  omeprazole (PRILOSEC) 20 MG capsule Take 20 mg by mouth 2 (two) times daily before a meal.   Yes [provider]  tranexamic acid (LYSTEDA) 650 MG TABS  tablet Take 1,300 mg by mouth 3 (three) times daily.   Yes [provider]  triamterene-hydrochlorothiazide (MAXZIDE-25) 37.5-25 MG tablet Take 1 tablet by mouth daily. 08/01/17  Yes [provider]    Past Medical, Surgical Family and Social History reviewed and updated.    Objective:   Today's Vitals   12/19/18 0958  BP: (!) 149/92  Pulse: 98  Resp: 17  Temp: 98.3 F (36.8 C)  SpO2: 96%  Weight: (!) 304 lb (137.9 kg)  Height: _0  (1.676 m)    Wt Readings from Last 3 Encounters:  12/19/18 (!) 304 lb (137.9 kg)  09/16/18 (!) 302 lb (137 kg)  08/25/18 (!) 301 lb (136.5 kg)     Physical Exam General appearance: alert, well developed, well nourished, cooperative and in no distress Head: Normocephalic, without obvious abnormality, atraumatic Respiratory: Respirations even and unlabored, normal respiratory rate Heart: rate and rhythm normal. No gallop or murmurs noted on exam  Abdomen: BS +, no distention, no rebound tenderness, or no mass Extremities: No gross deformities Skin: Skin color, texture, turgor normal. No rashes seen  Psych: Appropriate mood and affect. Neurologic: Mental status: Alert, oriented to person,  place, and time, thought content appropriate.  Diabetic Foot Exam - Simple   Simple Foot Form Diabetic Foot exam was performed with the following findings: Yes 12/19/2018 10:17 AM  Visual Inspection No deformities, no ulcerations, no other skin breakdown bilaterally: Yes Sensation Testing Intact to touch and monofilament testing bilaterally: Yes Pulse Check Comments    Lab Results  Component Value Date   POCGLU 274 (A) 09/21/2018    Lab Results  Component Value Date   HGBA1C 11.9 (H) 09/16/2018      Assessment & Plan:  1. Type 2 diabetes mellitus treated with insulin (HCC) Aim for 30 minutes of exercise most days, with a goal of 150 minutes per week. -Glucose monitoring at minimal of twice daily and keep a log of readings. -Commit to medication adherence and self-adjustment as needed -increase foods containing whole grains (one-half of grain intake). -saturated fat intake should be reduced -reduce intake of trans fat (lowers LDL cholesterol and increases HDL cholesterol) -Eat 4-5 small meals during the day to reduce the risk of becoming hungry. - HM Diabetes Foot Exam, completed  - Comprehensive metabolic panel - Hemoglobin A1c Medications: Discontinued metformin replaced with Januvia 50 mg once daily.  Patient will continue glipizide and Levemir at current doses until A1c has resulted.  2. Essential hypertension, elevated today. She has not taken medication.  Routinely well controlled with medication. No changes today. Encourage patient to engage in routine physical activity.   3. Morbid obesity ([place]) Encouraged efforts to reduce weight include engaging in physical activity as tolerated with goal of 150 minutes per week. Improve dietary choices and eat a meal regimen consistent with a Mediterranean or DASH diet. Reduce simple carbohydrates. Do not skip meals and eat healthy snacks throughout the day to avoid over-eating at dinner. Set a goal weight loss that is achievable for  you.   4. OBSTRUCTIVE SLEEP APNEA -no CPAP  -Encouraged smoking cessation and routine physical activity to reduce weight  5. Low TSH level - Thyroid Panel With TSH  6. Hyperlipidemia, unspecified hyperlipidemia type - Lipid panel  RTC: 3 months for hypertension and diabetes follow-up   -The patient was given clear instructions to go to ER or return to medical center if symptoms do not improve, worsen or new problems develop. The patient verbalized understanding.  Molli Barrows, FNP Primary Care at [facility] [address], [place] [phone]: [phone]

## 2018-12-20 LAB — COMPREHENSIVE METABOLIC PANEL
ALT: 10 IU/L (ref 0–32)
AST: 11 IU/L (ref 0–40)
Albumin/Globulin Ratio: 1.4 (ref 1.2–2.2)
Albumin: 4.5 g/dL (ref 3.8–4.8)
Alkaline Phosphatase: 108 IU/L (ref 39–117)
BUN/Creatinine Ratio: 10 (ref 9–23)
BUN: 7 mg/dL (ref 6–24)
Bilirubin Total: 0.4 mg/dL (ref 0.0–1.2)
CO2: 21 mmol/L (ref 20–29)
Calcium: 9.7 mg/dL (ref 8.7–10.2)
Chloride: 96 mmol/L (ref 96–106)
Creatinine, Ser: 0.7 mg/dL (ref 0.57–1.00)
GFR calc Af Amer: 122 mL/min/{1.73_m2} (ref >59)
GFR calc non Af Amer: 106 mL/min/{1.73_m2} (ref >59)
Globulin, Total: 3.2 g/dL (ref 1.5–4.5)
Glucose: 264 mg/dL — ABNORMAL HIGH (ref 65–99)
Potassium: 4.3 mmol/L (ref 3.5–5.2)
Sodium: 136 mmol/L (ref 134–144)
Total Protein: 7.7 g/dL (ref 6.0–8.5)

## 2018-12-20 LAB — LIPID PANEL
Chol/HDL Ratio: 5.6 ratio — ABNORMAL HIGH (ref 0.0–4.4)
Cholesterol, Total: 295 mg/dL — ABNORMAL HIGH (ref 100–199)
HDL: 53 mg/dL (ref >39)
LDL Calculated: 204 mg/dL — ABNORMAL HIGH (ref 0–99)
Triglycerides: 191 mg/dL — ABNORMAL HIGH (ref 0–149)
VLDL Cholesterol Cal: 38 mg/dL (ref 5–40)

## 2018-12-20 LAB — THYROID PANEL WITH TSH
Free Thyroxine Index: 1.4 (ref 1.2–4.9)
T3 Uptake Ratio: 22 % — ABNORMAL LOW (ref 24–39)
T4, Total: 6.5 ug/dL (ref 4.5–12.0)
TSH: 2.96 u[IU]/mL (ref 0.450–4.500)

## 2018-12-20 LAB — HEMOGLOBIN A1C
Est. average glucose Bld gHb Est-mCnc: 249 mg/dL
Hgb A1c MFr Bld: 10.3 % — ABNORMAL HIGH (ref 4.8–5.6)

## 2018-12-21 MED ORDER — ATORVASTATIN CALCIUM 20 MG PO TABS: 20.0000 mg | ORAL_TABLET | Freq: Every day | ORAL | 6 refills | Status: DC

## 2018-12-21 MED ORDER — INSULIN DETEMIR 100 UNIT/ML FLEXPEN: 35.0000 [IU] | PEN_INJECTOR | Freq: Two times a day (BID) | SUBCUTANEOUS | 11 refills | Status: DC

## 2018-12-21 NOTE — Addendum Note (Signed)
Addended by: Scot Jun on: 12/21/2018 08:40 AM   Modules accepted: Orders

## 2019-01-30 ENCOUNTER — Ambulatory Visit (INDEPENDENT_AMBULATORY_CARE_PROVIDER_SITE_OTHER): Payer: Medicaid Other

## 2019-01-30 ENCOUNTER — Ambulatory Visit (HOSPITAL_COMMUNITY)
Admission: EM | Admit: 2019-01-30 | Discharge: 2019-01-30 | Disposition: A | Discharge status: Home / Self Care | Payer: Medicaid Other | Attending: Emergency Medicine | Admitting: Emergency Medicine

## 2019-01-30 ENCOUNTER — Other Ambulatory Visit: Payer: Self-pay

## 2019-01-30 ENCOUNTER — Encounter (HOSPITAL_COMMUNITY): Payer: Self-pay | Admitting: *Deleted

## 2019-01-30 DIAGNOSIS — M79642 Pain in left hand: Secondary | ICD-10-CM

## 2019-01-30 DIAGNOSIS — Z9851 Tubal ligation status: Secondary | ICD-10-CM | POA: Diagnosis not present

## 2019-01-30 NOTE — Discharge Instructions (Addendum)
Continue taking the current medicine you are prescribed, Naprosyn.  Wear the Ace wrap as needed for comfort.    Follow-up with your primary care provider or an orthopedist as needed if your pain persists.

## 2019-01-30 NOTE — ED Triage Notes (Signed)
Denies injury.  STates woke up this AM with left hand pain.  CMS intact.

## 2019-01-30 NOTE — ED Provider Notes (Signed)
[name]    CSN: [id number] Arrival date & time: 01/30/19  0908      History   Chief Complaint Chief Complaint  Patient presents with  . Hand Pain    HPI Ruth [name] is a 43 y.o. female.   Patient reports left hand pain x2 hours since she woke up this morning.  She denies any known injury or fall but states she thinks she may have hit it in her sleep.  She denies numbness, tingling, or weakness but states it does hurt with movement of her thumb, index, middle fingers; she points to the MCP joints to indicate where the pain is.  She denies fever, chills, rash, lesions.  She has history of carpal tunnel on her right side.  LMP: 01/21/2019.  The history is provided by the patient.    Past Medical History:  Diagnosis Date  . Anemia   . Arthritis    chronic back pain   . Asthma   . Bipolar 1 disorder (Ruth [name])   . Fibromyalgia   . Hx of migraines    occ  . Hypertension   . Hypothyroidism   . Obesity   . Sleep apnea   . Type 2 diabetes mellitus (Ruth [name])    Type 2    Patient Active Problem List   Diagnosis Date Noted  . Abnormal uterine bleeding (AUB) 12/07/2017  . Abnormal mammogram of both breasts 11/02/2016  . HYPOTHYROIDISM 06/21/2009  . DM 06/21/2009  . BIPOLAR AFFECTIVE DISORDER 06/21/2009  . 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 INADEQUATE SLEEP HYGIENE 06/21/2009  . OBSTRUCTIVE SLEEP APNEA 06/21/2009  . 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 CARPAL TUNNEL SYNDROME 06/20/2009  . MERALGIA PARESTHETICA 06/20/2009  . HYPERTENSION 06/20/2009  . FIBROMYALGIA 06/20/2009  . CEPHALGIA 06/20/2009    Past Surgical History:  Procedure Laterality Date  . CARPAL TUNNEL RELEASE Right   . CESAREAN SECTION     x2  . DILITATION & CURRETTAGE/HYSTROSCOPY WITH NOVASURE ABLATION N/A 12/07/2017   Procedure: DILATATION & CURETTAGE/HYSTEROSCOPY WITH ATTEMPED NOVASURE ABLATION, FAILED/ENDOMETERIAL ABLATION WITH HTA SYSTEM;  Surgeon: Lavonia Drafts, MD;  Location: [facility];  Service: Gynecology;  Laterality: N/A;  . ELBOW SURGERY     "surgery for nerve pain"  . GANGLION CYST EXCISION Right    right arm   . TONSILLECTOMY     childhood  . TUBAL LIGATION    . VAGINA SURGERY     fistula    OB History    Gravida  7   Para  5   Term  4   Preterm  1   AB  2   Living  5     SAB  2   TAB      Ectopic      Multiple      Live Births  5            Home Medications    Prior to Admission medications   Medication Sig Start Date End Date Taking? Authorizing Provider  amLODipine (NORVASC) 5 MG tablet Take 1 tablet (5 mg total) by mouth daily. 09/16/18  Yes Scot Jun, FNP  atorvastatin (LIPITOR) 20 MG tablet Take 1 tablet (20 mg total) by mouth daily at 6 PM. 12/21/18  Yes Scot Jun, FNP  budesonide-formoterol [facility]) 160-4.5 MCG/ACT inhaler Inhale 2 puffs into the lungs 2 (two) times daily.   Yes [provider]  cetirizine (ZYRTEC) 10 MG tablet Take 1 tablet (10 mg total)  by mouth daily. 10/24/18  Yes Scot Jun, FNP  cyclobenzaprine (FLEXERIL) 5 MG tablet Take 1 tablet (5 mg total) by mouth 3 (three) times daily as needed for muscle spasms. 10/24/18  Yes Scot Jun, FNP  fluticasone (FLONASE) 50 MCG/ACT nasal spray Place 2 sprays into both nostrils daily. 10/24/18  Yes Scot Jun, FNP  glipiZIDE (GLUCOTROL XL) 10 MG 24 hr tablet Take 1 tablet (10 mg total) by mouth daily with breakfast. 10/24/18  Yes Scot Jun, FNP  Insulin Detemir (LEVEMIR) 100 UNIT/ML Pen Inject 35 Units into the skin 2 (two) times daily. 12/21/18  Yes Scot Jun, FNP  naproxen (NAPROSYN) 500 MG tablet Take 1 tablet (500 mg total) by mouth 2 (two) times daily with a meal. 10/24/18  Yes Scot Jun, FNP  omeprazole (PRILOSEC) 20 MG capsule Take 20 mg by mouth 2 (two) times daily before a meal.   Yes [provider]  sitaGLIPtin (JANUVIA) 50 MG tablet Take 1 tablet (50  mg total) by mouth daily. 12/19/18  Yes Scot Jun, FNP  tranexamic acid (LYSTEDA) 650 MG TABS tablet Take 1,300 mg by mouth 3 (three) times daily.   Yes [provider]  triamterene-hydrochlorothiazide (MAXZIDE-25) 37.5-25 MG tablet Take 1 tablet by mouth daily. 08/01/17  Yes [provider]  Accu-Chek FastClix Lancets MISC E1 1.65, FOUR BLOOD GLUCOSE TESTING FASTING ANND TWO HOURS AFTER SUPPER 07/01/18   [provider]  albuterol (ACCUNEB) 0.63 MG/3ML nebulizer solution USE 1 VIAL VIA NEBULIZER EVERY 4-6 HOURS AS NEEDED 10/24/18   Scot Jun, FNP  albuterol (VENTOLIN HFA) 108 (90 Base) MCG/ACT inhaler Inhale 1-2 puffs into the lungs every 6 (six) hours as needed for wheezing. 10/24/18   Scot Jun, FNP  Blood Glucose Monitoring Suppl (ACCU-CHEK GUIDE) w/Device KIT USE TO CHECK BLOOD SUGAR TWICE A DAY E11.65 04/15/18   [provider]  Insulin Pen Needle (PEN NEEDLES) 32G X 4 MM MISC 1 each by Does not apply route 2 (two) times daily. 10/24/18   Scot Jun, FNP  NOVOFINE 32G X 6 MM [place] INJECTION EACH DAY 06/08/18   [provider]    Family History Family History  Problem Relation Age of Onset  . Thyroid disease Mother   . Diabetes Daughter   . Diabetes Son   . Breast cancer Neg Hx     Social History Social History   Tobacco Use  . Smoking status: Current Every Day Smoker    Packs/day: 0.25    Types: Cigarettes  . Smokeless tobacco: Never Used  Substance Use Topics  . Alcohol use: Yes    Comment: occasionally  . Drug use: Yes    Types: Marijuana     Allergies   Metformin and related, Penicillins, and Victoza [liraglutide]   Review of Systems Review of Systems  Constitutional: Negative for chills and fever.  HENT: Negative for ear pain and sore throat.   Eyes: Negative for pain and visual disturbance.  Respiratory: Negative for cough and shortness of breath.   Cardiovascular:  Negative for chest pain and palpitations.  Gastrointestinal: Negative for abdominal pain and vomiting.  Genitourinary: Negative for dysuria and hematuria.  Musculoskeletal: Positive for arthralgias. Negative for back pain, gait problem, joint swelling and myalgias.  Skin: Negative for color change and rash.  Neurological: Negative for seizures and syncope.  All other systems reviewed and are negative.    Physical Exam Triage Vital Signs ED  Triage Vitals [01/30/19 0945]  Enc Vitals Group     BP (!) 136/106     Pulse Rate 92     Resp 20     Temp      Temp src      SpO2 100 %     Weight      Height      Head Circumference      Peak Flow      Pain Score 7     Pain Loc      Pain Edu?      Excl. in [place]?    No data found.  Updated Vital Signs BP 131/84 (BP Location: Right Arm)   Pulse 92   Resp 20   LMP 01/21/2019 (Exact Date)   SpO2 100%   Visual Acuity Right Eye Distance:   Left Eye Distance:   Bilateral Distance:    Right Eye Near:   Left Eye Near:    Bilateral Near:     Physical Exam Vitals signs and nursing note reviewed.  Constitutional:      General: She is not in acute distress.    Appearance: She is well-developed.  HENT:     Head: Normocephalic and atraumatic.  Eyes:     Conjunctiva/sclera: Conjunctivae normal.  Neck:     Musculoskeletal: Neck supple.  Cardiovascular:     Rate and Rhythm: Normal rate and regular rhythm.     Heart sounds: No murmur.  Pulmonary:     Effort: Pulmonary effort is normal. No respiratory distress.     Breath sounds: Normal breath sounds.  Abdominal:     Palpations: Abdomen is soft.     Tenderness: There is no abdominal tenderness.  Musculoskeletal:        General: Tenderness present. No swelling, deformity or signs of injury.     Comments: Left hand: tender with palpation and movement of thumb, index, and middle MCP joints.  No erythma, edema, wounds, ecchymosis, or increased warmth.    Skin:    General: Skin is warm  and dry.  Neurological:     Mental Status: She is alert.     Sensory: No sensory deficit.     Motor: No weakness.      UC Treatments / Results  Labs (all labs ordered are listed, but only abnormal results are displayed) Labs Reviewed - No data to display  EKG   Radiology No results found.  Procedures Procedures (including critical care time)  Medications Ordered in UC Medications - No data to display  Initial Impression / Assessment and Plan / UC Course  I have reviewed the triage vital signs and the nursing notes.  Pertinent labs & imaging results that were available during my care of the patient were reviewed by me and considered in my medical decision making (see chart for details).   Left hand pain.  Xray normal.  Instructed patient to take her prescribed Naprosyn and wear the Ace wrap provided here today for comfort as needed.  Instructed patient to follow-up with her PCP or with an orthopedic if her pain persist.      Final Clinical Impressions(s) / UC Diagnoses   Final diagnoses:  None   Discharge Instructions   None    ED Prescriptions    None     Controlled Substance Prescriptions [place] Controlled Substance Registry consulted? Yes, I have consulted the  Controlled Substances Registry for this patient, and feel the risk/benefit ratio today is favorable for proceeding with  this prescription for a controlled substance.   Sharion Balloon, NP 01/30/19 [phone]

## 2019-02-20 ENCOUNTER — Encounter (HOSPITAL_COMMUNITY): Payer: Self-pay

## 2019-02-20 ENCOUNTER — Other Ambulatory Visit: Payer: Self-pay

## 2019-02-20 ENCOUNTER — Ambulatory Visit (HOSPITAL_COMMUNITY)
Admission: EM | Admit: 2019-02-20 | Discharge: 2019-02-20 | Disposition: A | Discharge status: Home / Self Care | Payer: Medicaid Other | Attending: Family Medicine | Admitting: Family Medicine

## 2019-02-20 DIAGNOSIS — M79601 Pain in right arm: Secondary | ICD-10-CM | POA: Diagnosis not present

## 2019-02-20 DIAGNOSIS — M5410 Radiculopathy, site unspecified: Secondary | ICD-10-CM | POA: Diagnosis not present

## 2019-02-20 DIAGNOSIS — I10 Essential (primary) hypertension: Secondary | ICD-10-CM | POA: Diagnosis not present

## 2019-02-20 MED ORDER — KETOROLAC TROMETHAMINE 60 MG/2ML IM SOLN
INTRAMUSCULAR | Status: AC
  Filled 2019-02-20: qty 2

## 2019-02-20 MED ORDER — KETOROLAC TROMETHAMINE 60 MG/2ML IM SOLN
60.0000 mg | Freq: Once | INTRAMUSCULAR | Status: AC
  Administered 2019-02-20: 60 mg via INTRAMUSCULAR

## 2019-02-20 MED ORDER — PREDNISONE 10 MG (21) PO TBPK: ORAL_TABLET | Freq: Every day | ORAL | 0 refills | Status: DC

## 2019-02-20 NOTE — ED Triage Notes (Signed)
Pt presents with complaints of numbness in her right arm that started when she woke up yesterday morning. Patient denies any injury. States her left arm is now numb too. No focal deficits in triage.

## 2019-02-20 NOTE — Discharge Instructions (Signed)
Meds ordered this encounter  Medications   ketorolac (TORADOL) injection 60 mg    

## 2019-02-22 NOTE — ED Provider Notes (Signed)
Ruth [name]   [id number] 02/20/19 Arrival Time: 2831  ASSESSMENT & PLAN:  1. Arm pain, right   2. Radiculopathy of arm   3. Essential hypertension     Normal upper extremity neurological exam. No indication for upper extremity or neck imaging today. Discussed radicular pain and various causes. She will plan f/u with PCP to discuss further workup +/- orthopaedic evaluation.  Meds ordered this encounter  Medications  . ketorolac (TORADOL) injection 60 mg  . predniSONE (STERAPRED UNI-PAK 21 TAB) 10 MG (21) TBPK tablet    Sig: Take by mouth daily. Take as directed.    Dispense:  21 tablet    Refill:  0   Understands need to watch blood sugars closely while on prednisone. OTC analgesics as needed.  At her request, provided with: Orders Placed This Encounter  Procedures  . Apply sling arm foam    Follow-up Information    Schedule an appointment as soon as possible for a visit  with Scot Jun, FNP.   Specialty: Family Medicine Contact information: [address] [place] [address] [phone]          Reviewed expectations re: course of current medical issues. Questions answered. Outlined signs and symptoms indicating need for more acute intervention. Patient verbalized understanding. After Visit Summary given.  SUBJECTIVE: History from: patient. Ruth [name] is a 43 y.o. female who reports fairly persistent marked pain of her right arm; occasionally shoulder but mostly from elbow down; described as aching and with occasional sharp pain; "sometimes feel some tingling in my hand". No motor weakness reported. Onset: gradual, first noticed yesterday morning. Injury/trama: no. Symptoms have progressed to a point and plateaued since beginning. Aggravating factors: movement. Alleviating factors: none identified. Associated symptoms: none reported. Self treatment: tried OTCs without relief of pain. History of similar: yes.  Past  Surgical History:  Procedure Laterality Date  . CARPAL TUNNEL RELEASE Right   . CESAREAN SECTION     x2  . DILITATION & CURRETTAGE/HYSTROSCOPY WITH NOVASURE ABLATION N/A 12/07/2017   Procedure: DILATATION & CURETTAGE/HYSTEROSCOPY WITH ATTEMPED NOVASURE ABLATION, FAILED/ENDOMETERIAL ABLATION WITH HTA SYSTEM;  Surgeon: Lavonia Drafts, MD;  Location: [facility];  Service: Gynecology;  Laterality: N/A;  . ELBOW SURGERY     "surgery for nerve pain"  . GANGLION CYST EXCISION Right    right arm   . TONSILLECTOMY     childhood  . TUBAL LIGATION    . VAGINA SURGERY     fistula    Increased blood pressure noted today. Reports that she is treated for hypertension. Unsure if she has taken BP medication today.  She reports no chest pain on exertion, no dyspnea on exertion, no swelling of ankles, no orthostatic dizziness or lightheadedness, no orthopnea or paroxysmal nocturnal dyspnea, no palpitations and no intermittent claudication symptoms.  ROS: As per HPI. All other systems negative.    OBJECTIVE:  Vitals:   02/20/19 1228  BP: (!) 140/104  Pulse: 94  Resp: 17  Temp: 97.7 F (36.5 C)  TempSrc: Oral  SpO2: 99%    General appearance: alert; no distress HEENT: [place]; AT Neck: supple with FROM; no specific posterior or midline tenderness reported Resp: unlabored respirations Extremities: . RUE: warm and well perfused; poorly localized pain reported in her R arm with any movement; without gross deformities; with no swelling; with no bruising; ROM: normal with reported discomfort CV: brisk extremity capillary refill of RUE; 2+ radial pulse of RUE. Skin: warm and dry;  no visible rashes Neurologic: gait normal; normal reflexes of RUE and LUE; normal sensation of RUE and LUE; normal strength of RUE and LUE Psychological: alert and cooperative; normal mood and affect   Allergies  Allergen Reactions  . Metformin And Related     Intolerance   . Penicillins Other (See Comments)     Unknown; from childhood Has patient had a PCN reaction causing immediate rash, facial/tongue/throat swelling, SOB or lightheadedness with hypotension: Unknow Has patient had a PCN reaction causing severe rash involving mucus membranes or skin necrosis: Unknown Has patient had a PCN reaction that required hospitalization: Unknown Has patient had a PCN reaction occurring within the last 10 years: Unknown If all of the above answers are "NO", then may proceed with Cephalosporin use.   [name] [Liraglutide]     Itching     Past Medical History:  Diagnosis Date  . Anemia   . Arthritis    chronic back pain   . Asthma   . Bipolar 1 disorder ([place])   . Fibromyalgia   . Hx of migraines    occ  . Hypertension   . Hypothyroidism   . Obesity   . Sleep apnea   . Type 2 diabetes mellitus (HCC)    Type 2   Social History   Socioeconomic History  . Marital status: Married    Spouse name: Not on file  . Number of children: Not on file  . Years of education: Not on file  . Highest education level: Not on file  Occupational History  . Not on file  Social Needs  . Financial resource strain: Not on file  . Food insecurity    Worry: Not on file    Inability: Not on file  . Transportation needs    Medical: Not on file    Non-medical: Not on file  Tobacco Use  . Smoking status: Current Every Day Smoker    Packs/day: 0.25    Types: Cigarettes  . Smokeless tobacco: Never Used  Substance and Sexual Activity  . Alcohol use: Yes    Comment: occasionally  . Drug use: Yes    Types: Marijuana  . Sexual activity: Yes    Birth control/protection: Surgical  Lifestyle  . Physical activity    Days per week: Not on file    Minutes per session: Not on file  . Stress: Not on file  Relationships  . Social Herbalist on phone: Not on file    Gets together: Not on file    Attends religious service: Not on file    Active member of club or organization: Not on file    Attends  meetings of clubs or organizations: Not on file    Relationship status: Not on file  Other Topics Concern  . Not on file  Social History Narrative  . Not on file   Family History  Problem Relation Age of Onset  . Thyroid disease Mother   . Diabetes Daughter   . Diabetes Son   . Breast cancer Neg Hx    Past Surgical History:  Procedure Laterality Date  . CARPAL TUNNEL RELEASE Right   . CESAREAN SECTION     x2  . DILITATION & CURRETTAGE/HYSTROSCOPY WITH NOVASURE ABLATION N/A 12/07/2017   Procedure: DILATATION & CURETTAGE/HYSTEROSCOPY WITH ATTEMPED NOVASURE ABLATION, FAILED/ENDOMETERIAL ABLATION WITH HTA SYSTEM;  Surgeon: Lavonia Drafts, MD;  Location: [facility];  Service: Gynecology;  Laterality: N/A;  . ELBOW SURGERY     "  surgery for nerve pain"  . GANGLION CYST EXCISION Right    right arm   . TONSILLECTOMY     childhood  . TUBAL LIGATION    . VAGINA SURGERY     fistula      Vanessa Kick, MD 02/22/19 1351

## 2019-03-11 DIAGNOSIS — H16223 Keratoconjunctivitis sicca, not specified as Sjogren's, bilateral: Secondary | ICD-10-CM | POA: Diagnosis not present

## 2019-03-11 DIAGNOSIS — H40033 Anatomical narrow angle, bilateral: Secondary | ICD-10-CM | POA: Diagnosis not present

## 2019-03-20 ENCOUNTER — Telehealth: Payer: Self-pay

## 2019-03-20 NOTE — Progress Notes (Deleted)
   Subjective:    Patient ID: Ruth [name], female    DOB: 03-26-1976, 43 y.o.   MRN: [id number]  43 y.o.F f/u for HTN and T2DM Hypothyroidism Last seen: 12/19/18 Diabetes ARRAH RHEAUME monitors glucose at home. Last A1C 11.9. Current medication regimen includes glipizide, metformin, and Levemir. She reports today, stopping metformin back in April due to GI side effects. Blood sugars at have ranged between 170-220's fasting and non-fasting. No readings greater than 250 or low blood sugar readings. Denies 3 P's. No recent dietary changes. Unfortunately continues to smoke. Engages in no routine exercise and nor adheres to diabetes diet. Current Body mass index is 49.07 kg/m.  Hypertension   Elevated today. She has not taken medication today. BP routinely controlled with Maxide. Denies CP, SOB, edema, headaches or weakness.  Acid Reflux  Endorses recent acid reflux symptoms including indigestion and periodic dysphagia type symptoms.  Patient has been prescribed omeprazole 20 mg twice daily however has been taking as needed opposed to help was prescribed daily.  Patient endorses foods such as spicy foods, tomatoes, pizza, triggers for her acid reflux.  Patient also is a current everyday smoker. Type 2 diabetes mellitus treated with insulin (HCC) Aim for 30 minutes of exercise most days, with a goal of 150 minutes per week. -Glucose monitoring at minimal of twice daily and keep a log of readings. -Commit to medication adherence and self-adjustment as needed -increase foods containing whole grains (one-half of grain intake). -saturated fat intake should be reduced -reduce intake of trans fat (lowers LDL cholesterol and increases HDL cholesterol) -Eat 4-5 small meals during the day to reduce the risk of becoming hungry. - HM Diabetes Foot Exam, completed  - Comprehensive metabolic panel - Hemoglobin A1c Medications: Discontinued metformin replaced with Januvia 50 mg once daily.   Patient will continue glipizide and Levemir at current doses until A1c has resulted.  2. Essential hypertension, elevated today. She has not taken medication.  Routinely well controlled with medication. No changes today. Encourage patient to engage in routine physical activity.   3. Morbid obesity ([place]) Encouraged efforts to reduce weight include engaging in physical activity as tolerated with goal of 150 minutes per week. Improve dietary choices and eat a meal regimen consistent with a Mediterranean or DASH diet. Reduce simple carbohydrates. Do not skip meals and eat healthy snacks throughout the day to avoid over-eating at dinner. Set a goal weight loss that is achievable for you.   4. OBSTRUCTIVE SLEEP APNEA -no CPAP  -Encouraged smoking cessation and routine physical activity to reduce weight  5. Low TSH level - Thyroid Panel With TSH  6. Hyperlipidemia, unspecified hyperlipidemia type - Lipid panel  Last A1C 10.3    In [facility] 02/20/19 for arm pain R  1. Arm pain, right  2. Radiculopathy of arm        Review of Systems     Objective:   Physical Exam        Assessment & Plan:

## 2019-03-20 NOTE — Telephone Encounter (Signed)
Called patient to do their pre-visit COVID screening.  Have you tested positive for COVID or are you currently waiting for COVID test results? no  Have you recently traveled internationally([place], [place], [place], [place], [place]) or within the [place] to a hotspot area([place], [place], [place], [place], [place])? no  Are you currently experiencing any of the following symptoms: fever, cough, SHOB, fatigue, body aches, loss of smell/taste, rash, diarrhea, vomiting, severe headaches, weakness, sore throat? no  Have you been in contact with anyone who has recently travelled? no  Have you been in contact with anyone who is experiencing any of the above symptoms or been diagnosed with COVID  or works in or has recently visited a SNF? no  Asked patient to come in fasting for repeat labs.

## 2019-03-21 ENCOUNTER — Ambulatory Visit: Payer: Medicaid Other

## 2019-06-07 ENCOUNTER — Ambulatory Visit (HOSPITAL_COMMUNITY)
Admission: EM | Admit: 2019-06-07 | Discharge: 2019-06-07 | Disposition: A | Discharge status: Home / Self Care | Payer: Medicaid Other | Attending: Family Medicine | Admitting: Family Medicine

## 2019-06-07 ENCOUNTER — Encounter (HOSPITAL_COMMUNITY): Payer: Self-pay | Admitting: Emergency Medicine

## 2019-06-07 ENCOUNTER — Other Ambulatory Visit: Payer: Self-pay

## 2019-06-07 DIAGNOSIS — G5711 Meralgia paresthetica, right lower limb: Secondary | ICD-10-CM | POA: Diagnosis not present

## 2019-06-07 MED ORDER — GABAPENTIN 300 MG PO CAPS: 300.0000 mg | ORAL_CAPSULE | Freq: Every day | ORAL | 2 refills | Status: DC

## 2019-06-07 MED ORDER — IBUPROFEN 800 MG PO TABS: 800.0000 mg | ORAL_TABLET | Freq: Three times a day (TID) | ORAL | 0 refills | Status: DC

## 2019-06-07 NOTE — ED Triage Notes (Signed)
Pt reports right upper leg pain and numbness that radiates into her buttocks.  She states this has come and gone for several years but it has gotten worse in the last month.

## 2019-06-08 NOTE — ED Provider Notes (Signed)
Ruth [name]   [id number] 06/07/19 Arrival Time: [id number]  ASSESSMENT & PLAN:  1. Meralgia paraesthetica, right     She voices concern/worry over "blood clots". Reassured her that I have no worry or suspicion of a DVT. Discussed that meralgia paraesthetica usually resolves with time. No indication for imaging.  Begin trial of: Meds ordered this encounter  Medications  . gabapentin (NEURONTIN) 300 MG capsule    Sig: Take 1 capsule (300 mg total) by mouth at bedtime.    Dispense:  30 capsule    Refill:  2  . ibuprofen (ADVIL) 800 MG tablet    Sig: Take 1 tablet (800 mg total) by mouth 3 (three) times daily with meals.    Dispense:  21 tablet    Refill:  0    May f/u here as needed. Reviewed expectations re: course of current medical issues. Questions answered. Outlined signs and symptoms indicating need for more acute intervention. Patient verbalized understanding. After Visit Summary given.  SUBJECTIVE: History from: patient. Ruth [name] is a 43 y.o. female who reports intermittent and non-radiating "numb feeling and pain" of her R lateral thigh. Mild and transient over several years but more frequent and noticeable over the past month. No changes in activity levels reported. Does not wake her from sleep. Pain/numbness may last a few minutes to a few hours. Does not limit daily activities. No abdominal or back pain reported. No extremity weakness. Ambulatory without difficulty. Normal bowel/bladder habits. Injury/trama: no. Symptoms have waxed and waned but are worse overall since beginning. Aggravating factors: have not been identified. Alleviating factors: have not been identified. Associated symptoms: none reported. Self treatment: OTC analgesics with questionable relief at times.   Past Surgical History:  Procedure Laterality Date  . CARPAL TUNNEL RELEASE Right   . CESAREAN SECTION     x2  . DILITATION & CURRETTAGE/HYSTROSCOPY WITH NOVASURE ABLATION N/A  12/07/2017   Procedure: DILATATION & CURETTAGE/HYSTEROSCOPY WITH ATTEMPED NOVASURE ABLATION, FAILED/ENDOMETERIAL ABLATION WITH HTA SYSTEM;  Surgeon: Lavonia Drafts, MD;  Location: [facility];  Service: Gynecology;  Laterality: N/A;  . ELBOW SURGERY     "surgery for nerve pain"  . GANGLION CYST EXCISION Right    right arm   . TONSILLECTOMY     childhood  . TUBAL LIGATION    . VAGINA SURGERY     fistula     ROS: As per HPI. All other systems negative.    OBJECTIVE:  Vitals:   06/07/19 1451  BP: (!) 133/97  Pulse: 78  Resp: 18  Temp: 98.1 F (36.7 C)  TempSrc: Oral  SpO2: 100%    General appearance: alert; no distress HEENT: [place]; AT Neck: supple with FROM Resp: unlabored respirations Extremities: . RLE: warm with well perfused appearance; without TTP over R lateral thigh; without gross deformities; swelling: none; bruising: none; hip and knee ROM: normal; no calf swelling or pain CV: brisk extremity capillary refill of RLE; 2+ DP/PT pulses of RLE. Skin: warm and dry; no visible rashes Neurologic: gait normal; normal reflexes of RLE; normal sensation of RLE; normal strength of RLE Psychological: alert and cooperative; normal mood and affect    Allergies  Allergen Reactions  . Metformin And Related     Intolerance   . Penicillins Other (See Comments)    Unknown; from childhood Has patient had a PCN reaction causing immediate rash, facial/tongue/throat swelling, SOB or lightheadedness with hypotension: Unknow Has patient had a PCN reaction causing severe rash involving mucus membranes or  skin necrosis: Unknown Has patient had a PCN reaction that required hospitalization: Unknown Has patient had a PCN reaction occurring within the last 10 years: Unknown If all of the above answers are "NO", then may proceed with Cephalosporin use.   [name] [Liraglutide]     Itching     Past Medical History:  Diagnosis Date  . Anemia   . Arthritis    chronic back pain   .  Asthma   . Bipolar 1 disorder ([place])   . Fibromyalgia   . Hx of migraines    occ  . Hypertension   . Hypothyroidism   . Obesity   . Sleep apnea   . Type 2 diabetes mellitus (HCC)    Type 2   Social History   Socioeconomic History  . Marital status: Married    Spouse name: Not on file  . Number of children: Not on file  . Years of education: Not on file  . Highest education level: Not on file  Occupational History  . Not on file  Social Needs  . Financial resource strain: Not on file  . Food insecurity    Worry: Not on file    Inability: Not on file  . Transportation needs    Medical: Not on file    Non-medical: Not on file  Tobacco Use  . Smoking status: Current Every Day Smoker    Packs/day: 0.25    Types: Cigarettes  . Smokeless tobacco: Never Used  Substance and Sexual Activity  . Alcohol use: Yes    Comment: occasionally  . Drug use: Yes    Types: Marijuana  . Sexual activity: Yes    Birth control/protection: Surgical  Lifestyle  . Physical activity    Days per week: Not on file    Minutes per session: Not on file  . Stress: Not on file  Relationships  . Social Herbalist on phone: Not on file    Gets together: Not on file    Attends religious service: Not on file    Active member of club or organization: Not on file    Attends meetings of clubs or organizations: Not on file    Relationship status: Not on file  Other Topics Concern  . Not on file  Social History Narrative  . Not on file   Family History  Problem Relation Age of Onset  . Thyroid disease Mother   . Diabetes Daughter   . Diabetes Son   . Breast cancer Neg Hx    Past Surgical History:  Procedure Laterality Date  . CARPAL TUNNEL RELEASE Right   . CESAREAN SECTION     x2  . DILITATION & CURRETTAGE/HYSTROSCOPY WITH NOVASURE ABLATION N/A 12/07/2017   Procedure: DILATATION & CURETTAGE/HYSTEROSCOPY WITH ATTEMPED NOVASURE ABLATION, FAILED/ENDOMETERIAL ABLATION WITH HTA SYSTEM;   Surgeon: Lavonia Drafts, MD;  Location: [facility];  Service: Gynecology;  Laterality: N/A;  . ELBOW SURGERY     "surgery for nerve pain"  . GANGLION CYST EXCISION Right    right arm   . TONSILLECTOMY     childhood  . TUBAL LIGATION    . VAGINA SURGERY     fistula      Vanessa Kick, MD 06/08/19 [phone]

## 2019-08-07 ENCOUNTER — Encounter (HOSPITAL_COMMUNITY): Payer: Self-pay

## 2019-08-07 ENCOUNTER — Ambulatory Visit (HOSPITAL_COMMUNITY)
Admission: EM | Admit: 2019-08-07 | Discharge: 2019-08-07 | Disposition: A | Discharge status: Home / Self Care | Payer: Medicaid Other | Attending: Family Medicine | Admitting: Family Medicine

## 2019-08-07 ENCOUNTER — Other Ambulatory Visit: Payer: Self-pay

## 2019-08-07 DIAGNOSIS — L0291 Cutaneous abscess, unspecified: Secondary | ICD-10-CM

## 2019-08-07 MED ORDER — IBUPROFEN 800 MG PO TABS
800.0000 mg | ORAL_TABLET | Freq: Once | ORAL | Status: AC
  Administered 2019-08-07: 800 mg via ORAL

## 2019-08-07 MED ORDER — IBUPROFEN 800 MG PO TABS
ORAL_TABLET | ORAL | Status: AC
  Filled 2019-08-07: qty 1

## 2019-08-07 MED ORDER — LIDOCAINE-EPINEPHRINE (PF) 2 %-1:200000 IJ SOLN
INTRAMUSCULAR | Status: AC
  Filled 2019-08-07: qty 20

## 2019-08-07 MED ORDER — DOXYCYCLINE HYCLATE 100 MG PO CAPS: 100.0000 mg | ORAL_CAPSULE | Freq: Two times a day (BID) | ORAL | 0 refills | Status: DC

## 2019-08-07 NOTE — Discharge Instructions (Signed)
Keep dressing in place for the next 24 hours. May remove tomorrow.  Then cleanse area daily with soap and water.  Keep covered to keep protected and collect drainage.   Warm compresses especially over the next 24 hours to promote drainage, 30 minutes at time every couple of hours.  Complete course of antibiotics.  If symptoms worsen or do not improve in the next week to return to be seen or to follow up with your PCP.

## 2019-08-07 NOTE — ED Provider Notes (Signed)
[facility]    CSN: [id number] Arrival date & time: 08/07/19  1359      History   Chief Complaint Chief Complaint  Patient presents with  . Abdominal Pain    HPI Ruth [name] is a 44 y.o. female.   Ruth [name] presents with complaints of tender area to right abdomen which she noted yesterday. Somewhat firm to touch, she is concerned about abscess to the area. Has had abscess to under arms in the past which have required incision and drainage. Denies any previous to her abdomen. No fevers. No drainage. History  Of DM and htn, obesity.     ROS per HPI, negative if not otherwise mentioned.      Past Medical History:  Diagnosis Date  . Anemia   . Arthritis    chronic back pain   . Asthma   . Bipolar 1 disorder ([place])   . Fibromyalgia   . Hx of migraines    occ  . Hypertension   . Hypothyroidism   . Obesity   . Sleep apnea   . Type 2 diabetes mellitus ([place])    Type 2    Patient Active Problem List   Diagnosis Date Noted  . Abnormal uterine bleeding (AUB) 12/07/2017  . Abnormal mammogram of both breasts 11/02/2016  . HYPOTHYROIDISM 06/21/2009  . DM 06/21/2009  . BIPOLAR AFFECTIVE DISORDER 06/21/2009  . 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 INADEQUATE SLEEP HYGIENE 06/21/2009  . OBSTRUCTIVE SLEEP APNEA 06/21/2009  . 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 CARPAL TUNNEL SYNDROME 06/20/2009  . MERALGIA PARESTHETICA 06/20/2009  . HYPERTENSION 06/20/2009  . FIBROMYALGIA 06/20/2009  . CEPHALGIA 06/20/2009    Past Surgical History:  Procedure Laterality Date  . CARPAL TUNNEL RELEASE Right   . CESAREAN SECTION     x2  . DILITATION & CURRETTAGE/HYSTROSCOPY WITH NOVASURE ABLATION N/A 12/07/2017   Procedure: DILATATION & CURETTAGE/HYSTEROSCOPY WITH ATTEMPED NOVASURE ABLATION, FAILED/ENDOMETERIAL ABLATION WITH HTA SYSTEM;  Surgeon: Lavonia Drafts, MD;  Location: [facility];  Service: Gynecology;  Laterality: N/A;  . ELBOW  SURGERY     "surgery for nerve pain"  . GANGLION CYST EXCISION Right    right arm   . TONSILLECTOMY     childhood  . TUBAL LIGATION    . VAGINA SURGERY     fistula    OB History    Gravida  7   Para  5   Term  4   Preterm  1   AB  2   Living  5     SAB  2   TAB      Ectopic      Multiple      Live Births  5            Home Medications    Prior to Admission medications   Medication Sig Start Date End Date Taking? Authorizing Provider  Accu-Chek FastClix Lancets MISC E1 1.65, FOUR BLOOD GLUCOSE TESTING FASTING ANND TWO HOURS AFTER SUPPER 07/01/18  Yes [provider]  ACCU-CHEK GUIDE test strip CHECK BLOOD SUGARS TWICE, E11.65 12/21/18  Yes [provider]  albuterol (ACCUNEB) 0.63 MG/3ML nebulizer solution USE 1 VIAL VIA NEBULIZER EVERY 4-6 HOURS AS NEEDED 10/24/18  Yes Scot Jun, FNP  albuterol (VENTOLIN HFA) 108 (90 Base) MCG/ACT inhaler Inhale 1-2 puffs into the lungs every 6 (six) hours as needed for wheezing. 10/24/18  Yes Scot Jun, FNP  amLODipine (NORVASC) 5 MG tablet Take 1  tablet (5 mg total) by mouth daily. 09/16/18  Yes Scot Jun, FNP  atorvastatin (LIPITOR) 20 MG tablet Take 1 tablet (20 mg total) by mouth daily at 6 PM. 12/21/18  Yes Scot Jun, FNP  budesonide-formoterol [facility]) 160-4.5 MCG/ACT inhaler Inhale 2 puffs into the lungs 2 (two) times daily.   Yes [provider]  cetirizine (ZYRTEC) 10 MG tablet Take 1 tablet (10 mg total) by mouth daily. 10/24/18  Yes Scot Jun, FNP  fluticasone (FLONASE) 50 MCG/ACT nasal spray Place 2 sprays into both nostrils daily. 10/24/18  Yes Scot Jun, FNP  glipiZIDE (GLUCOTROL XL) 10 MG 24 hr tablet Take 1 tablet (10 mg total) by mouth daily with breakfast. 10/24/18  Yes Scot Jun, FNP  ibuprofen (ADVIL) 800 MG tablet Take 1 tablet (800 mg total) by mouth 3 (three) times daily with meals. 06/07/19  Yes Hagler, Aaron Edelman, MD    Insulin Detemir (LEVEMIR) 100 UNIT/ML Pen Inject 35 Units into the skin 2 (two) times daily. 12/21/18  Yes Scot Jun, FNP  Insulin Pen Needle (PEN NEEDLES) 32G X 4 MM MISC 1 each by Does not apply route 2 (two) times daily. 10/24/18  Yes Scot Jun, FNP  omeprazole (PRILOSEC) 20 MG capsule Take 20 mg by mouth 2 (two) times daily before a meal.   Yes [provider]  sitaGLIPtin (JANUVIA) 50 MG tablet Take 1 tablet (50 mg total) by mouth daily. 12/19/18  Yes Scot Jun, FNP  triamterene-hydrochlorothiazide (MAXZIDE-25) 37.5-25 MG tablet Take 1 tablet by mouth daily. 08/01/17  Yes [provider]  doxycycline (VIBRAMYCIN) 100 MG capsule Take 1 capsule (100 mg total) by mouth 2 (two) times daily. 08/07/19   Zigmund Gottron, NP  gabapentin (NEURONTIN) 300 MG capsule Take 1 capsule (300 mg total) by mouth at bedtime. 06/07/19   Vanessa Kick, MD  tranexamic acid (LYSTEDA) 650 MG TABS tablet Take 1,300 mg by mouth 3 (three) times daily.    [provider]    Family History Family History  Problem Relation Age of Onset  . Thyroid disease Mother   . Diabetes Daughter   . Diabetes Son   . Breast cancer Neg Hx     Social History Social History   Tobacco Use  . Smoking status: Current Every Day Smoker    Packs/day: 0.25    Types: Cigarettes  . Smokeless tobacco: Never Used  Substance Use Topics  . Alcohol use: Yes    Comment: occasionally  . Drug use: Yes    Types: Marijuana     Allergies   Metformin and related, Penicillins, and Victoza [liraglutide]   Review of Systems Review of Systems   Physical Exam Triage Vital Signs ED Triage Vitals  Enc Vitals Group     BP 08/07/19 1517 (!) 146/100     Pulse Rate 08/07/19 1517 97     Resp 08/07/19 1517 18     Temp 08/07/19 1517 98.1 F (36.7 C)     Temp src --      SpO2 08/07/19 1517 98 %     Weight --      Height --      Head Circumference --      Peak Flow --      Pain Score  08/07/19 1518 6     Pain Loc --      Pain Edu? --      Excl. in [place]? --    No data  found.  Updated Vital Signs BP (!) 146/100   Pulse 97   Temp 98.1 F (36.7 C)   Resp 18   LMP 07/05/2019   SpO2 98%   Visual Acuity Right Eye Distance:   Left Eye Distance:   Bilateral Distance:    Right Eye Near:   Left Eye Near:    Bilateral Near:     Physical Exam Constitutional:      General: She is not in acute distress.    Appearance: She is well-developed. She is obese.  Cardiovascular:     Rate and Rhythm: Normal rate.  Pulmonary:     Effort: Pulmonary effort is normal.  Abdominal:       Comments: Firmness, slight redness to right abdomen soft tissues, very small area at center of fluctuance; no visible "head" or raised area; no active drainage; approximately 3cm in total diameter   Skin:    General: Skin is warm and dry.  Neurological:     Mental Status: She is alert and oriented to person, place, and time.      UC Treatments / Results  Labs (all labs ordered are listed, but only abnormal results are displayed) Labs Reviewed - No data to display  EKG   Radiology No results found.  Procedures Incision and Drainage  Date/Time: 08/07/2019 4:16 PM Performed by: Zigmund Gottron, NP Authorized by: Raylene Everts, MD   Consent:    Consent obtained:  Verbal   Consent given by:  Patient   Risks discussed:  Bleeding, incomplete drainage, infection and pain   Alternatives discussed:  Observation, referral, delayed treatment and no treatment Location:    Type:  Abscess   Size:  3   Location:  Trunk   Trunk location:  Abdomen Pre-procedure details:    Skin preparation:  Betadine Anesthesia (see MAR for exact dosages):    Anesthesia method:  Topical application and local infiltration (pain ease spray )   Local anesthetic:  Lidocaine 2% WITH epi Procedure type:    Complexity:  Simple Procedure details:    Incision types:  Single straight   Scalpel blade:   11   Wound management:  Probed and deloculated   Drainage:  Purulent and bloody   Drainage amount:  Moderate   Wound treatment:  Wound left open   Packing materials:  None Post-procedure details:    Patient tolerance of procedure:  Tolerated well, no immediate complications   (including critical care time)  Medications Ordered in UC Medications  ibuprofen (ADVIL) tablet 800 mg (800 mg Oral Given 08/07/19 1602)    Initial Impression / Assessment and Plan / UC Course  I have reviewed the triage vital signs and the nursing notes.  Pertinent labs & imaging results that were available during my care of the patient were reviewed by me and considered in my medical decision making (see chart for details).     Moderate amount of output from abscess to right abdomen with incision and drainage. Patient tolerated. Wound care and pain management discussed. Return precautions provided. Patient verbalized understanding and agreeable to plan.   Final Clinical Impressions(s) / UC Diagnoses   Final diagnoses:  Abscess     Discharge Instructions     Keep dressing in place for the next 24 hours. May remove tomorrow.  Then cleanse area daily with soap and water.  Keep covered to keep protected and collect drainage.   Warm compresses especially over the next 24 hours to promote drainage, 30 minutes  at time every couple of hours.  Complete course of antibiotics.  If symptoms worsen or do not improve in the next week to return to be seen or to follow up with your PCP.     ED Prescriptions    Medication Sig Dispense Auth. Provider   doxycycline (VIBRAMYCIN) 100 MG capsule Take 1 capsule (100 mg total) by mouth 2 (two) times daily. 20 capsule Zigmund Gottron, NP     PDMP not reviewed this encounter.   Zigmund Gottron, NP 08/07/19 [id number]

## 2019-08-07 NOTE — ED Triage Notes (Signed)
Pt presents with a deep lump on the right side of her abdomen. The area is hard, tender to touch and warm to touch. The patient states that she noticed the pain yesterday.

## 2019-08-30 ENCOUNTER — Ambulatory Visit (HOSPITAL_COMMUNITY)
Admission: EM | Admit: 2019-08-30 | Discharge: 2019-08-30 | Disposition: A | Discharge status: Home / Self Care | Payer: Medicaid Other | Attending: Family Medicine | Admitting: Family Medicine

## 2019-08-30 ENCOUNTER — Other Ambulatory Visit: Payer: Self-pay

## 2019-08-30 ENCOUNTER — Encounter (HOSPITAL_COMMUNITY): Payer: Self-pay | Admitting: Emergency Medicine

## 2019-08-30 DIAGNOSIS — M79605 Pain in left leg: Secondary | ICD-10-CM | POA: Diagnosis not present

## 2019-08-30 DIAGNOSIS — M5432 Sciatica, left side: Secondary | ICD-10-CM

## 2019-08-30 MED ORDER — KETOROLAC TROMETHAMINE 60 MG/2ML IM SOLN
60.0000 mg | Freq: Once | INTRAMUSCULAR | Status: AC
  Administered 2019-08-30: 60 mg via INTRAMUSCULAR

## 2019-08-30 MED ORDER — IBUPROFEN 800 MG PO TABS: 800.0000 mg | ORAL_TABLET | Freq: Three times a day (TID) | ORAL | 0 refills | Status: DC | PRN

## 2019-08-30 MED ORDER — DEXAMETHASONE SODIUM PHOSPHATE 10 MG/ML IJ SOLN
10.0000 mg | Freq: Once | INTRAMUSCULAR | Status: AC
  Administered 2019-08-30: 14:00:00 10 mg via INTRAMUSCULAR

## 2019-08-30 MED ORDER — KETOROLAC TROMETHAMINE 60 MG/2ML IM SOLN
INTRAMUSCULAR | Status: AC
  Filled 2019-08-30: qty 2

## 2019-08-30 MED ORDER — DEXAMETHASONE SODIUM PHOSPHATE 10 MG/ML IJ SOLN
INTRAMUSCULAR | Status: AC
  Filled 2019-08-30: qty 1

## 2019-08-30 MED ORDER — CYCLOBENZAPRINE HCL 10 MG PO TABS: 10.0000 mg | ORAL_TABLET | Freq: Two times a day (BID) | ORAL | 0 refills | Status: DC | PRN

## 2019-08-30 NOTE — ED Triage Notes (Signed)
Pt reports having some left medial knee pain about one week ago.  She states it hurt to the touch and then she developed a bruise in the area.  About one day ago she reports left lower back pain that radiates into her left buttocks, left groin and her left medial thigh.

## 2019-08-30 NOTE — ED Provider Notes (Signed)
[facility]    CSN: [id number] Arrival date & time: 08/30/19  1207      History   Chief Complaint Chief Complaint  Patient presents with  . Leg Pain    left    HPI Ruth [name] is a 44 y.o. female.   Reports L lower back pain with radiation down the left leg. Reports that she hit her knee and has a bruise but cannot recall any incident. Has taken OTC motrin and used icy hot with little relief. Has been occurring for about 3 days, but increased in intensity today. Denies fever, HA, SOB, ST, muscle aches, rash, other symptoms.   ROS per HPI  The history is provided by the patient.    Past Medical History:  Diagnosis Date  . Anemia   . Arthritis    chronic back pain   . Asthma   . Bipolar 1 disorder ([place])   . Fibromyalgia   . Hx of migraines    occ  . Hypertension   . Hypothyroidism   . Obesity   . Sleep apnea   . Type 2 diabetes mellitus ([place])    Type 2    Patient Active Problem List   Diagnosis Date Noted  . Abnormal uterine bleeding (AUB) 12/07/2017  . Abnormal mammogram of both breasts 11/02/2016  . HYPOTHYROIDISM 06/21/2009  . DM 06/21/2009  . BIPOLAR AFFECTIVE DISORDER 06/21/2009  . 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 INADEQUATE SLEEP HYGIENE 06/21/2009  . OBSTRUCTIVE SLEEP APNEA 06/21/2009  . 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 CARPAL TUNNEL SYNDROME 06/20/2009  . MERALGIA PARESTHETICA 06/20/2009  . HYPERTENSION 06/20/2009  . FIBROMYALGIA 06/20/2009  . CEPHALGIA 06/20/2009    Past Surgical History:  Procedure Laterality Date  . CARPAL TUNNEL RELEASE Right   . CESAREAN SECTION     x2  . DILITATION & CURRETTAGE/HYSTROSCOPY WITH NOVASURE ABLATION N/A 12/07/2017   Procedure: DILATATION & CURETTAGE/HYSTEROSCOPY WITH ATTEMPED NOVASURE ABLATION, FAILED/ENDOMETERIAL ABLATION WITH HTA SYSTEM;  Surgeon: Lavonia Drafts, MD;  Location: [facility];  Service: Gynecology;  Laterality: N/A;  . ELBOW SURGERY     "surgery for nerve pain"  . GANGLION CYST EXCISION Right    right arm   . TONSILLECTOMY     childhood  . TUBAL LIGATION    . VAGINA SURGERY     fistula    OB History    Gravida  7   Para  5   Term  4   Preterm  1   AB  2   Living  5     SAB  2   TAB      Ectopic      Multiple      Live Births  5            Home Medications    Prior to Admission medications   Medication Sig Start Date End Date Taking? Authorizing Provider  Accu-Chek FastClix Lancets MISC E1 1.65, FOUR BLOOD GLUCOSE TESTING FASTING ANND TWO HOURS AFTER SUPPER 07/01/18  Yes [provider]  ACCU-CHEK GUIDE test strip CHECK BLOOD SUGARS TWICE, E11.65 12/21/18  Yes [provider]  amLODipine (NORVASC) 5 MG tablet Take 1 tablet (5 mg total) by mouth daily. 09/16/18  Yes Scot Jun, FNP  atorvastatin (LIPITOR) 20 MG tablet Take 1 tablet (20 mg total) by mouth daily at 6 PM. 12/21/18  Yes Scot Jun, FNP  cetirizine (ZYRTEC) 10 MG tablet Take 1 tablet (10 mg total) by mouth  daily. 10/24/18  Yes Scot Jun, FNP  gabapentin (NEURONTIN) 300 MG capsule Take 1 capsule (300 mg total) by mouth at bedtime. 06/07/19  Yes Vanessa Kick, MD  glipiZIDE (GLUCOTROL XL) 10 MG 24 hr tablet Take 1 tablet (10 mg total) by mouth daily with breakfast. 10/24/18  Yes Scot Jun, FNP  Insulin Detemir (LEVEMIR) 100 UNIT/ML Pen Inject 35 Units into the skin 2 (two) times daily. 12/21/18  Yes Scot Jun, FNP  Insulin Pen Needle (PEN NEEDLES) 32G X 4 MM MISC 1 each by Does not apply route 2 (two) times daily. 10/24/18  Yes Scot Jun, FNP  omeprazole (PRILOSEC) 20 MG capsule Take 20 mg by mouth 2 (two) times daily before a meal.   Yes [provider]  sitaGLIPtin (JANUVIA) 50 MG tablet Take 1 tablet (50 mg total) by mouth daily. 12/19/18  Yes Scot Jun, FNP  triamterene-hydrochlorothiazide (MAXZIDE-25) 37.5-25 MG tablet Take 1 tablet by mouth daily.  08/01/17  Yes [provider]  albuterol (ACCUNEB) 0.63 MG/3ML nebulizer solution USE 1 VIAL VIA NEBULIZER EVERY 4-6 HOURS AS NEEDED 10/24/18   Scot Jun, FNP  albuterol (VENTOLIN HFA) 108 (90 Base) MCG/ACT inhaler Inhale 1-2 puffs into the lungs every 6 (six) hours as needed for wheezing. 10/24/18   Scot Jun, FNP  budesonide-formoterol (SYMBICORT) 160-4.5 MCG/ACT inhaler Inhale 2 puffs into the lungs 2 (two) times daily.    [provider]  cyclobenzaprine (FLEXERIL) 10 MG tablet Take 1 tablet (10 mg total) by mouth 2 (two) times daily as needed for muscle spasms. 08/30/19   Faustino Congress, NP  doxycycline (VIBRAMYCIN) 100 MG capsule Take 1 capsule (100 mg total) by mouth 2 (two) times daily. 08/07/19   Zigmund Gottron, NP  fluticasone (FLONASE) 50 MCG/ACT nasal spray Place 2 sprays into both nostrils daily. 10/24/18   Scot Jun, FNP  ibuprofen (ADVIL) 800 MG tablet Take 1 tablet (800 mg total) by mouth every 8 (eight) hours as needed for moderate pain. 08/30/19   Faustino Congress, NP  tranexamic acid (LYSTEDA) 650 MG TABS tablet Take 1,300 mg by mouth 3 (three) times daily.    [provider]    Family History Family History  Problem Relation Age of Onset  . Thyroid disease Mother   . Diabetes Daughter   . Diabetes Son   . Breast cancer Neg Hx     Social History Social History   Tobacco Use  . Smoking status: Current Every Day Smoker    Packs/day: 0.25    Types: Cigarettes  . Smokeless tobacco: Never Used  Substance Use Topics  . Alcohol use: Yes    Comment: occasionally  . Drug use: Yes    Types: Marijuana     Allergies   Metformin and related, Penicillins, and Victoza [liraglutide]   Review of Systems Review of Systems   Physical Exam Triage Vital Signs ED Triage Vitals  Enc Vitals Group     BP      Pulse      Resp      Temp      Temp src      SpO2      Weight      Height      Head Circumference       Peak Flow      Pain Score      Pain Loc      Pain Edu?      Excl. in  [place]?    No data found.  Updated Vital Signs BP (!) 142/94 (BP Location: Right Arm) Comment: large cuff  Pulse 89   Temp 97.8 F (36.6 C) (Oral)   Resp 18   LMP 08/24/2019 (Exact Date)   SpO2 99%   Visual Acuity Right Eye Distance:   Left Eye Distance:   Bilateral Distance:    Right Eye Near:   Left Eye Near:    Bilateral Near:     Physical Exam Vitals and nursing note reviewed.  Constitutional:      General: She is not in acute distress.    Appearance: She is well-developed.  HENT:     Head: Normocephalic and atraumatic.     Nose: Nose normal.     Mouth/Throat:     Mouth: Mucous membranes are moist.  Eyes:     Conjunctiva/sclera: Conjunctivae normal.  Cardiovascular:     Rate and Rhythm: Normal rate and regular rhythm.     Heart sounds: Normal heart sounds. No murmur.  Pulmonary:     Effort: Pulmonary effort is normal. No respiratory distress.     Breath sounds: Normal breath sounds.  Abdominal:     General: Bowel sounds are normal. There is no distension.     Palpations: Abdomen is soft. There is no mass.     Tenderness: There is no abdominal tenderness. There is no guarding.     Hernia: No hernia is present.  Musculoskeletal:        General: Tenderness present.     Cervical back: Neck supple.  Skin:    General: Skin is warm and dry.     Capillary Refill: Capillary refill takes less than 2 seconds.  Neurological:     General: No focal deficit present.     Mental Status: She is alert and oriented to person, place, and time.  Psychiatric:        Mood and Affect: Mood normal.        Behavior: Behavior normal.      UC Treatments / Results  Labs (all labs ordered are listed, but only abnormal results are displayed) Labs Reviewed - No data to display  EKG   Radiology No results found.  Procedures Procedures (including critical care time)  Medications Ordered in UC Medications   ketorolac (TORADOL) injection 60 mg (60 mg Intramuscular Given 08/30/19 1337)  dexamethasone (DECADRON) injection 10 mg (10 mg Intramuscular Given 08/30/19 1337)    Initial Impression / Assessment and Plan / UC Course  I have reviewed the triage vital signs and the nursing notes.  Pertinent labs & imaging results that were available during my care of the patient were reviewed by me and considered in my medical decision making (see chart for details).    Presents for L sided sciatica. Tenderness to L lower back. Decadron 10mg  and Toradol 60mg  in office today. Ibuprofen and flexeril sent to pharmacy. Instructed when to follow up with ortho. Instructed on when to go to the ER. Final Clinical Impressions(s) / UC Diagnoses   Final diagnoses:  Left leg pain  Sciatica of left side     Discharge Instructions     Take the ibuprofen as prescribed.  Rest and elevate your leg.  Apply ice packs 2-3 times a day for up to 20 minutes each. Wear an Ace wrap as needed for comfort.  Follow up with your primary care provider or an orthopedist if you symptoms continue or worsen;  Or if you develop new symptoms,  such as numbness, tingling, or weakness.       ED Prescriptions    Medication Sig Dispense Auth. Provider   cyclobenzaprine (FLEXERIL) 10 MG tablet Take 1 tablet (10 mg total) by mouth 2 (two) times daily as needed for muscle spasms. 20 tablet Faustino Congress, NP   ibuprofen (ADVIL) 800 MG tablet Take 1 tablet (800 mg total) by mouth every 8 (eight) hours as needed for moderate pain. 21 tablet Faustino Congress, NP     I have reviewed the PDMP during this encounter.   Faustino Congress, NP 08/30/19 1348

## 2019-08-30 NOTE — Discharge Instructions (Addendum)
Take the ibuprofen as prescribed.  Rest and elevate your leg.  Apply ice packs 2-3 times a day for up to 20 minutes each. Wear an Ace wrap as needed for comfort.  Follow up with your primary care provider or an orthopedist if you symptoms continue or worsen;  Or if you develop new symptoms, such as numbness, tingling, or weakness.

## 2019-09-04 ENCOUNTER — Emergency Department (HOSPITAL_COMMUNITY): Payer: Medicaid Other

## 2019-09-04 ENCOUNTER — Emergency Department (HOSPITAL_COMMUNITY)
Admission: EM | Admit: 2019-09-04 | Discharge: 2019-09-04 | Disposition: A | Discharge status: Home / Self Care | Payer: Medicaid Other | Attending: Emergency Medicine | Admitting: Emergency Medicine

## 2019-09-04 ENCOUNTER — Other Ambulatory Visit: Payer: Self-pay

## 2019-09-04 ENCOUNTER — Encounter (HOSPITAL_COMMUNITY): Payer: Self-pay | Admitting: Emergency Medicine

## 2019-09-04 DIAGNOSIS — Z7984 Long term (current) use of oral hypoglycemic drugs: Secondary | ICD-10-CM | POA: Insufficient documentation

## 2019-09-04 DIAGNOSIS — F17210 Nicotine dependence, cigarettes, uncomplicated: Secondary | ICD-10-CM | POA: Diagnosis not present

## 2019-09-04 DIAGNOSIS — R319 Hematuria, unspecified: Secondary | ICD-10-CM | POA: Diagnosis not present

## 2019-09-04 DIAGNOSIS — I10 Essential (primary) hypertension: Secondary | ICD-10-CM | POA: Insufficient documentation

## 2019-09-04 DIAGNOSIS — M5440 Lumbago with sciatica, unspecified side: Secondary | ICD-10-CM | POA: Insufficient documentation

## 2019-09-04 DIAGNOSIS — M5442 Lumbago with sciatica, left side: Secondary | ICD-10-CM | POA: Diagnosis not present

## 2019-09-04 DIAGNOSIS — M545 Low back pain: Secondary | ICD-10-CM | POA: Diagnosis not present

## 2019-09-04 DIAGNOSIS — E039 Hypothyroidism, unspecified: Secondary | ICD-10-CM | POA: Insufficient documentation

## 2019-09-04 DIAGNOSIS — J45909 Unspecified asthma, uncomplicated: Secondary | ICD-10-CM | POA: Diagnosis not present

## 2019-09-04 DIAGNOSIS — E119 Type 2 diabetes mellitus without complications: Secondary | ICD-10-CM | POA: Diagnosis not present

## 2019-09-04 DIAGNOSIS — Z79899 Other long term (current) drug therapy: Secondary | ICD-10-CM | POA: Insufficient documentation

## 2019-09-04 DIAGNOSIS — M25552 Pain in left hip: Secondary | ICD-10-CM | POA: Diagnosis not present

## 2019-09-04 LAB — URINALYSIS, ROUTINE W REFLEX MICROSCOPIC
Bilirubin Urine: NEGATIVE
Glucose, UA: 50 mg/dL — AB
Ketones, ur: 5 mg/dL — AB
Nitrite: NEGATIVE
Protein, ur: NEGATIVE mg/dL
Specific Gravity, Urine: 1.021 (ref 1.005–1.030)
pH: 5 (ref 5.0–8.0)

## 2019-09-04 LAB — PREGNANCY, URINE: Preg Test, Ur: NEGATIVE

## 2019-09-04 MED ORDER — MELOXICAM 15 MG PO TABS: 15.0000 mg | ORAL_TABLET | Freq: Every day | ORAL | 0 refills | Status: DC | PRN

## 2019-09-04 MED ORDER — LIDOCAINE 5 % EX PTCH: 1.0000 | MEDICATED_PATCH | CUTANEOUS | 0 refills | Status: DC

## 2019-09-04 MED ORDER — OXYCODONE-ACETAMINOPHEN 5-325 MG PO TABS
1.0000 | ORAL_TABLET | Freq: Once | ORAL | Status: AC
  Administered 2019-09-04: 1 via ORAL
  Filled 2019-09-04: qty 1

## 2019-09-04 MED ORDER — METHOCARBAMOL 500 MG PO TABS: 500.0000 mg | ORAL_TABLET | Freq: Two times a day (BID) | ORAL | 0 refills | Status: DC | PRN

## 2019-09-04 MED ORDER — DIAZEPAM 5 MG PO TABS
5.0000 mg | ORAL_TABLET | Freq: Once | ORAL | Status: AC
  Administered 2019-09-04: 13:00:00 5 mg via ORAL
  Filled 2019-09-04: qty 1

## 2019-09-04 NOTE — ED Provider Notes (Signed)
[facility] EMERGENCY DEPARTMENT Provider Note   CSN: [id number] Arrival date & time: 09/04/19  1048     History Chief Complaint  Patient presents with  . Back Pain    Ruth [name] is a 44 y.o. female with a hx of bipolar 1 disorder, fibromyalgia, T2DM, sleep apnea, hypothyroidism, & hypertension who presents to the ED with complaints of back pain x 1 week. Pain is located in the left lower back radiates into the LLE. Pain is constant. Currently a 10/10 in severity, aggravated with walking/movement, bending left leg improves it. Seen by UC 1 week prior, suspected sciatica, given prescription for flexeril & ibuprofen which she is taking without relief. No specific injury or change in activity. Denies numbness, tingling, weakness, saddle anesthesia, incontinence to bowel/bladder, fever, chills, IV drug use, dysuria, hematuria, abdominal pain, vaginal bleeding, vaginal discharge, or hx of cancer. Patient has not had prior back surgeries.       Past Medical History:  Diagnosis Date  . Anemia   . Arthritis    chronic back pain   . Asthma   . Bipolar 1 disorder ([place])   . Fibromyalgia   . Hx of migraines    occ  . Hypertension   . Hypothyroidism   . Obesity   . Sleep apnea   . Type 2 diabetes mellitus ([place])    Type 2    Patient Active Problem List   Diagnosis Date Noted  . Abnormal uterine bleeding (AUB) 12/07/2017  . Abnormal mammogram of both breasts 11/02/2016  . HYPOTHYROIDISM 06/21/2009  . DM 06/21/2009  . BIPOLAR AFFECTIVE DISORDER 06/21/2009  . 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 INADEQUATE SLEEP HYGIENE 06/21/2009  . OBSTRUCTIVE SLEEP APNEA 06/21/2009  . 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 CARPAL TUNNEL SYNDROME 06/20/2009  . MERALGIA PARESTHETICA 06/20/2009  . HYPERTENSION 06/20/2009  . FIBROMYALGIA 06/20/2009  . CEPHALGIA 06/20/2009    Past Surgical History:  Procedure Laterality Date  . CARPAL TUNNEL RELEASE  Right   . CESAREAN SECTION     x2  . DILITATION & CURRETTAGE/HYSTROSCOPY WITH NOVASURE ABLATION N/A 12/07/2017   Procedure: DILATATION & CURETTAGE/HYSTEROSCOPY WITH ATTEMPED NOVASURE ABLATION, FAILED/ENDOMETERIAL ABLATION WITH HTA SYSTEM;  Surgeon: Lavonia Drafts, MD;  Location: [facility];  Service: Gynecology;  Laterality: N/A;  . ELBOW SURGERY     "surgery for nerve pain"  . GANGLION CYST EXCISION Right    right arm   . TONSILLECTOMY     childhood  . TUBAL LIGATION    . VAGINA SURGERY     fistula     OB History    Gravida  7   Para  5   Term  4   Preterm  1   AB  2   Living  5     SAB  2   TAB      Ectopic      Multiple      Live Births  5           Family History  Problem Relation Age of Onset  . Thyroid disease Mother   . Diabetes Daughter   . Diabetes Son   . Breast cancer Neg Hx     Social History   Tobacco Use  . Smoking status: Current Every Day Smoker    Packs/day: 0.25    Types: Cigarettes  . Smokeless tobacco: Never Used  Substance Use Topics  . Alcohol use: Yes    Comment: occasionally  . Drug  use: Yes    Types: Marijuana    Home Medications Prior to Admission medications   Medication Sig Start Date End Date Taking? Authorizing Provider  Accu-Chek FastClix Lancets MISC E1 1.65, FOUR BLOOD GLUCOSE TESTING FASTING ANND TWO HOURS AFTER SUPPER 07/01/18   [provider]  ACCU-CHEK GUIDE test strip CHECK BLOOD SUGARS TWICE, E11.65 12/21/18   [provider]  albuterol (ACCUNEB) 0.63 MG/3ML nebulizer solution USE 1 VIAL VIA NEBULIZER EVERY 4-6 HOURS AS NEEDED 10/24/18   Scot Jun, FNP  albuterol (VENTOLIN HFA) 108 (90 Base) MCG/ACT inhaler Inhale 1-2 puffs into the lungs every 6 (six) hours as needed for wheezing. 10/24/18   Scot Jun, FNP  amLODipine (NORVASC) 5 MG tablet Take 1 tablet (5 mg total) by mouth daily. 09/16/18   Scot Jun, FNP  atorvastatin (LIPITOR) 20 MG tablet Take 1 tablet  (20 mg total) by mouth daily at 6 PM. 12/21/18   Scot Jun, FNP  budesonide-formoterol [facility]) 160-4.5 MCG/ACT inhaler Inhale 2 puffs into the lungs 2 (two) times daily.    [provider]  cetirizine (ZYRTEC) 10 MG tablet Take 1 tablet (10 mg total) by mouth daily. 10/24/18   Scot Jun, FNP  cyclobenzaprine (FLEXERIL) 10 MG tablet Take 1 tablet (10 mg total) by mouth 2 (two) times daily as needed for muscle spasms. 08/30/19   Faustino Congress, NP  doxycycline (VIBRAMYCIN) 100 MG capsule Take 1 capsule (100 mg total) by mouth 2 (two) times daily. 08/07/19   Zigmund Gottron, NP  fluticasone (FLONASE) 50 MCG/ACT nasal spray Place 2 sprays into both nostrils daily. 10/24/18   Scot Jun, FNP  gabapentin (NEURONTIN) 300 MG capsule Take 1 capsule (300 mg total) by mouth at bedtime. 06/07/19   Vanessa Kick, MD  glipiZIDE (GLUCOTROL XL) 10 MG 24 hr tablet Take 1 tablet (10 mg total) by mouth daily with breakfast. 10/24/18   Scot Jun, FNP  ibuprofen (ADVIL) 800 MG tablet Take 1 tablet (800 mg total) by mouth every 8 (eight) hours as needed for moderate pain. 08/30/19   Faustino Congress, NP  Insulin Detemir (LEVEMIR) 100 UNIT/ML Pen Inject 35 Units into the skin 2 (two) times daily. 12/21/18   Scot Jun, FNP  Insulin Pen Needle (PEN NEEDLES) 32G X 4 MM MISC 1 each by Does not apply route 2 (two) times daily. 10/24/18   Scot Jun, FNP  omeprazole (PRILOSEC) 20 MG capsule Take 20 mg by mouth 2 (two) times daily before a meal.    [provider]  sitaGLIPtin (JANUVIA) 50 MG tablet Take 1 tablet (50 mg total) by mouth daily. 12/19/18   Scot Jun, FNP  tranexamic acid (LYSTEDA) 650 MG TABS tablet Take 1,300 mg by mouth 3 (three) times daily.    [provider]  triamterene-hydrochlorothiazide (MAXZIDE-25) 37.5-25 MG tablet Take 1 tablet by mouth daily. 08/01/17   [provider]    Allergies    Metformin and  related, Penicillins, and Victoza [liraglutide]  Review of Systems   Review of Systems  Constitutional: Negative for chills and fever.  Respiratory: Negative for cough and shortness of breath.   Cardiovascular: Negative for chest pain.  Gastrointestinal: Negative for abdominal pain, nausea and vomiting.  Genitourinary: Negative for dysuria, genital sores, hematuria, menstrual problem, pelvic pain, urgency, vaginal bleeding and vaginal discharge.  Musculoskeletal: Positive for back pain and myalgias.  Neurological: Negative for weakness and numbness.  Negative for incontinence or saddle anesthesia.    Physical Exam Updated Vital Signs BP (!) 140/109 (BP Location: Right Wrist)   Pulse (!) 110   Temp 98 F (36.7 C) (Oral)   Resp 18   LMP 08/24/2019 (Exact Date)   SpO2 98%   Physical Exam Vitals and nursing note reviewed.  Constitutional:      General: She is not in acute distress.    Appearance: She is well-developed. She is not ill-appearing or toxic-appearing.  HENT:     Head: Normocephalic and atraumatic.  Cardiovascular:     Rate and Rhythm: Normal rate and regular rhythm.     Pulses:          Dorsalis pedis pulses are 2+ on the right side and 2+ on the left side.       Posterior tibial pulses are 2+ on the right side and 2+ on the left side.  Pulmonary:     Effort: Pulmonary effort is normal.     Breath sounds: Normal breath sounds.  Abdominal:     General: There is no distension.     Palpations: Abdomen is soft.     Tenderness: There is no abdominal tenderness. There is no right CVA tenderness, left CVA tenderness, guarding or rebound.  Musculoskeletal:     Cervical back: Normal range of motion and neck supple. No spinous process tenderness or muscular tenderness.     Comments: No obvious deformity, appreciable swelling, erythema, ecchymosis, significant open wounds, or increased warmth.   Back: No point/focal vertebral tenderness, no palpable step off or  crepitus.  Diffuse midline lumbar spine tenderness to palpation extending to the left lumbar paraspinal muscles as well as the left gluteal region. Lower extremities: Intact active range of motion.  Tender over the left lateral and posterior hip of the left lower extremity.  Otherwise nontender.  Compartments are soft.  Skin:    General: Skin is warm and dry.     Capillary Refill: Capillary refill takes less than 2 seconds.     Findings: No rash.  Neurological:     Mental Status: She is alert.     Deep Tendon Reflexes:     Reflex Scores:      Patellar reflexes are 2+ on the right side and 2+ on the left side.    Comments: Sensation grossly intact to bilateral lower extremities. 5/5 symmetric strength with plantar/dorsiflexion bilaterally. Gait is intact without obvious foot drop.   Psychiatric:        Mood and Affect: Mood normal.        Behavior: Behavior normal.    ED Results / Procedures / Treatments   Labs (all labs ordered are listed, but only abnormal results are displayed) Labs Reviewed  URINALYSIS, ROUTINE W REFLEX MICROSCOPIC - Abnormal; Notable for the following components:      Result Value   APPearance HAZY (*)    Glucose, UA 50 (*)    Hgb urine dipstick MODERATE (*)    Ketones, ur 5 (*)    Leukocytes,Ua TRACE (*)    Bacteria, UA MANY (*)    All other components within normal limits  URINE CULTURE  PREGNANCY, URINE    EKG None  Radiology No results found.  Procedures Procedures (including critical care time)  Medications Ordered in ED Medications - No data to display  ED Course  I have reviewed the triage vital signs and the nursing notes.  Pertinent labs & imaging results that were available during my  care of the patient were reviewed by me and considered in my medical decision making (see chart for details).    MDM Rules/Calculators/A&P                      Patient presents with complaint of back pain into the LLE.  Patient is nontoxic appearing,  vitals mild tachycardia resolved on my exam and elevated BP- doubt HTN emergency. Xrays w/o acute process.  No focal neurologic deficits, ambulatory, do not suspect acute cord compression or cauda equina.  No history of IVDU, afebrile, do not suspect epidural abscess.  Urine with bacteria obtained per triage, however patient without urinary symptoms, do not suspect pyelonephritis or UTI.  Hematuria will need PCP recheck, pain is not colicky- nephrolithiasis seems unlikely. Does not seem to fit clinical picture of dissection.  Suspect muscle strain versus spasm with a degree of sciatica.  Patient feeling improved following analgesics in the emergency department.  She is tolerating p.o.  Will discharge home with alternative NSAID and muscle relaxant therapy as well as Lidoderm patches with orthopedics follow-up.  I discussed treatment plan, need for PCP follow-up, and return precautions with the patient. Provided opportunity for questions, patient confirmed understanding and is in agreement with plan.   Final Clinical Impression(s) / ED Diagnoses Final diagnoses:  Acute left-sided low back pain with sciatica, sciatica laterality unspecified  Hematuria, unspecified type    Rx / DC Orders ED Discharge Orders         Ordered    methocarbamol (ROBAXIN) 500 MG tablet  2 times daily PRN     09/04/19 1505    meloxicam (MOBIC) 15 MG tablet  Daily PRN     09/04/19 1505    lidocaine (LIDODERM) 5 %  Every 24 hours     09/04/19 1505           Fynn Adel, [place] R, PA-C 09/04/19 1509    Virgel Manifold, MD 09/05/19 [phone]

## 2019-09-04 NOTE — ED Notes (Signed)
Pt provided wheelchair for disharge. Discharge instructions reviewed, as well as instructions of how to take prescriptions appropriately. Pt verbalized understanding.

## 2019-09-04 NOTE — ED Triage Notes (Signed)
Pt c/o ongoing left side sciatica pain since last week. Seen at UC she received ibuprofen and flexiral but she has had no relief and is now unable to stand up straight. Denies lose of bowel or bladder.

## 2019-09-04 NOTE — Discharge Instructions (Addendum)
You were seen in the emergency department for back pain into the left leg today.  At this time we suspect that your pain is related to a muscle strain/spasm with a degree of sciatica.  Your xrays did not show any substantial abnormalities.   We have prescribed you an anti-inflammatory medication and a muscle relaxer.  Meloxicam - this is a nonsteroidal anti-inflammatory medication that will help with pain and swelling. Be sure to take this medication as prescribed with food, 1 pill every 21 hours,  It should be taken with food, as it can cause stomach upset, and more seriously, stomach bleeding. Do not take other nonsteroidal anti-inflammatory medications with this such as Advil, Motrin, Aleve, Mobic, Goodie Powder, naproxen, or Motrin.    - Robaxin is the muscle relaxer I have prescribed, this is meant to help with muscle tightness. Be aware that this medication may make you drowsy therefore the first time you take this it should be at a time you are in an environment where you can rest. Do not drive or operate heavy machinery when taking this medication. Do not drink alcohol or take other sedating medications with this medicine such as narcotics or benzodiazepines.   Lidoderm-this is a topical patch to place directly over your area of pain once per day to help numb/to the muscle.  You make take Tylenol per over the counter dosing with these medications.   We have prescribed you new medication(s) today. Discuss the medications prescribed today with your pharmacist as they can have adverse effects and interactions with your other medicines including over the counter and prescribed medications. Seek medical evaluation if you start to experience new or abnormal symptoms after taking one of these medicines, seek care immediately if you start to experience difficulty breathing, feeling of your throat closing, facial swelling, or rash as these could be indications of a more serious allergic reaction   The  application of heat can help soothe the pain.     Your urine did have some blood in it, please have this rechecked by your primary care provider within 1 month. We would like you to follow-up with your primary care provider and/or orthopedics within 3 days for reevaluation.  However return to the ER should you develop ne or worsening symptoms or any other concerns including but not limited to severe or worsening pain, low back pain with fever, numbness, weakness, loss of bowel or bladder control, or inability to walk or urinate, you should return to the ER immediately.

## 2019-09-05 DIAGNOSIS — M545 Low back pain: Secondary | ICD-10-CM | POA: Diagnosis not present

## 2019-09-05 LAB — URINE CULTURE

## 2019-09-18 ENCOUNTER — Ambulatory Visit: Payer: Medicaid Other

## 2019-09-19 ENCOUNTER — Ambulatory Visit (INDEPENDENT_AMBULATORY_CARE_PROVIDER_SITE_OTHER): Payer: Medicaid Other | Admitting: Internal Medicine

## 2019-09-19 DIAGNOSIS — I10 Essential (primary) hypertension: Secondary | ICD-10-CM

## 2019-09-19 DIAGNOSIS — E119 Type 2 diabetes mellitus without complications: Secondary | ICD-10-CM | POA: Diagnosis not present

## 2019-09-19 DIAGNOSIS — Z794 Long term (current) use of insulin: Secondary | ICD-10-CM

## 2019-09-19 DIAGNOSIS — M5416 Radiculopathy, lumbar region: Secondary | ICD-10-CM | POA: Diagnosis not present

## 2019-09-19 DIAGNOSIS — E1169 Type 2 diabetes mellitus with other specified complication: Secondary | ICD-10-CM | POA: Diagnosis not present

## 2019-09-19 DIAGNOSIS — E785 Hyperlipidemia, unspecified: Secondary | ICD-10-CM

## 2019-09-19 MED ORDER — SITAGLIPTIN PHOSPHATE 50 MG PO TABS: 50.0000 mg | ORAL_TABLET | Freq: Every day | ORAL | 1 refills | Status: DC

## 2019-09-19 MED ORDER — INSULIN DETEMIR 100 UNIT/ML FLEXPEN: 38.0000 [IU] | PEN_INJECTOR | Freq: Two times a day (BID) | SUBCUTANEOUS | 1 refills | Status: DC

## 2019-09-19 MED ORDER — AMLODIPINE BESYLATE 5 MG PO TABS: 5.0000 mg | ORAL_TABLET | Freq: Every day | ORAL | 1 refills | Status: DC

## 2019-09-19 MED ORDER — TRIAMTERENE-HCTZ 37.5-25 MG PO TABS: 1.0000 | ORAL_TABLET | Freq: Every day | ORAL | 1 refills | Status: DC

## 2019-09-19 MED ORDER — GLIPIZIDE ER 10 MG PO TB24: 10.0000 mg | ORAL_TABLET | Freq: Every day | ORAL | 1 refills | Status: DC

## 2019-09-19 MED ORDER — ATORVASTATIN CALCIUM 20 MG PO TABS: 20.0000 mg | ORAL_TABLET | Freq: Every day | ORAL | 1 refills | Status: DC

## 2019-09-19 NOTE — Progress Notes (Signed)
Virtual Visit via Telephone Note Due to current restrictions/limitations of in-office visits due to the COVID-19 pandemic, this scheduled clinical appointment was converted to a telehealth visit  I connected with Ruth [name] on 09/19/19 at 1:56 p.m by telephone and verified that I am speaking with the correct person using two identifiers. I am in my office.  The patient is at home.  Only the patient and myself participated in this encounter.  I discussed the limitations, risks, security and privacy concerns of performing an evaluation and management service by telephone and the availability of in person appointments. I also discussed with the patient that there may be a patient responsible charge related to this service. The patient expressed understanding and agreed to proceed.   History of Present Illness: Patient with history of DM type II, morbid obesity, HTN, HL, OSA bipolar 1, fibromyalgia.  Last seen by NP Kenton Kingfisher 12/2018  DIABETES TYPE 2 Last A1C:     Med Adherence:  [x]  Yes- Levemir 35 units BID, Januvia, Glucotrol.  Out of all meds x 1.5 wks Medication side effects:  []  Yes    [x]  No Home Monitoring?  [x]  Yes mornings only before BF Home glucose results range: 140-190 Diet Adherence: drinking more water, no more sodas.  Meats - chicken, fish, burgers.  Not much rice.   Exercise: [x]  Yes - but not as much as I should   []  No Hypoglycemic episodes?: []  Yes    [x]  No Numbness of the feet? []  Yes    [x]  No Retinopathy hx? []  Yes    []  No Last eye exam: has appt tomorrow with [facility]. Comments:   HYPERTENSION Currently taking: see medication list.  Out of Norvasc and HCTZ Med Adherence: [x]  Yes    []  No Medication side effects: []  Yes    [x]  No Adherence with salt restriction: [x]  Yes    []  No Home Monitoring?: []  Yes    [x]  No Monitoring Frequency: []  Yes    []  No Home BP results range: []  Yes    []  No SOB? []  Yes    [x]  No Chest Pain?: []  Yes    [x]  No Leg  swelling?: []  Yes    [x]  No Headaches?: []  Yes    [x]  No Dizziness? []  Yes    [x]  No Comments:   HL:  Out of Lipitor.    Seen in ER earlier this mth for left lower back pain radiating to LLE x 1 wk.  Started on lidocaine patches, Robaxin and meloxicam.  She was referred to orthopedics.  She saw them recently and they had ordered an MRI on her back.  However her insurance did not approve it.  She has to try 6 weeks of conservative management.  They have scheduled her for a follow-up appointment with them. Outpatient Encounter Medications as of 09/19/2019  Medication Sig  . Accu-Chek FastClix Lancets MISC E1 1.65, FOUR BLOOD GLUCOSE TESTING FASTING ANND TWO HOURS AFTER SUPPER  . ACCU-CHEK GUIDE test strip CHECK BLOOD SUGARS TWICE, E11.65  . albuterol (ACCUNEB) 0.63 MG/3ML nebulizer solution USE 1 VIAL VIA NEBULIZER EVERY 4-6 HOURS AS NEEDED  . albuterol (VENTOLIN HFA) 108 (90 Base) MCG/ACT inhaler Inhale 1-2 puffs into the lungs every 6 (six) hours as needed for wheezing.  Marland Kitchen amLODipine (NORVASC) 5 MG tablet Take 1 tablet (5 mg total) by mouth daily.  Marland Kitchen atorvastatin (LIPITOR) 20 MG tablet Take 1 tablet (20 mg total) by mouth daily at 6 PM.  .  budesonide-formoterol (SYMBICORT) 160-4.5 MCG/ACT inhaler Inhale 2 puffs into the lungs 2 (two) times daily.  . cetirizine (ZYRTEC) 10 MG tablet Take 1 tablet (10 mg total) by mouth daily.  . cyclobenzaprine (FLEXERIL) 10 MG tablet Take 1 tablet (10 mg total) by mouth 2 (two) times daily as needed for muscle spasms.  . fluticasone (FLONASE) 50 MCG/ACT nasal spray Place 2 sprays into both nostrils daily.  Marland Kitchen gabapentin (NEURONTIN) 300 MG capsule Take 1 capsule (300 mg total) by mouth at bedtime.  Marland Kitchen glipiZIDE (GLUCOTROL XL) 10 MG 24 hr tablet Take 1 tablet (10 mg total) by mouth daily with breakfast.  . Insulin Detemir (LEVEMIR) 100 UNIT/ML Pen Inject 35 Units into the skin 2 (two) times daily.  . Insulin Pen Needle (PEN NEEDLES) 32G X 4 MM MISC 1 each by Does  not apply route 2 (two) times daily.  Marland Kitchen lidocaine (LIDODERM) 5 % Place 1 patch onto the skin daily. Apply 1 patch to left lower back once per day. Remove & Discard patch within 12 hours.  . methocarbamol (ROBAXIN) 500 MG tablet Take 1-2 tablets (500-1,000 mg total) by mouth 2 (two) times daily as needed for muscle spasms.  Marland Kitchen omeprazole (PRILOSEC) 20 MG capsule Take 20 mg by mouth 2 (two) times daily before a meal.  . sitaGLIPtin (JANUVIA) 50 MG tablet Take 1 tablet (50 mg total) by mouth daily.  Marland Kitchen triamterene-hydrochlorothiazide (MAXZIDE-25) 37.5-25 MG tablet Take 1 tablet by mouth daily.  . [DISCONTINUED] doxycycline (VIBRAMYCIN) 100 MG capsule Take 1 capsule (100 mg total) by mouth 2 (two) times daily.  . [DISCONTINUED] ibuprofen (ADVIL) 800 MG tablet Take 1 tablet (800 mg total) by mouth every 8 (eight) hours as needed for moderate pain.  . [DISCONTINUED] meloxicam (MOBIC) 15 MG tablet Take 1 tablet (15 mg total) by mouth daily as needed for pain.  . [DISCONTINUED] tranexamic acid (LYSTEDA) 650 MG TABS tablet Take 1,300 mg by mouth 3 (three) times daily.   No facility-administered encounter medications on file as of 09/19/2019.    Observations/Objective: Lab Results  Component Value Date   WBC 6.2 08/12/2018   HGB 11.9 (L) 08/12/2018   HCT 37.3 08/12/2018   MCV 81.3 08/12/2018   PLT 435 (H) 08/12/2018     Chemistry      Component Value Date/Time   NA 136 12/19/2018 1103   K 4.3 12/19/2018 1103   CL 96 12/19/2018 1103   CO2 21 12/19/2018 1103   BUN 7 12/19/2018 1103   CREATININE 0.70 12/19/2018 1103      Component Value Date/Time   CALCIUM 9.7 12/19/2018 1103   ALKPHOS 108 12/19/2018 1103   AST 11 12/19/2018 1103   ALT 10 12/19/2018 1103   BILITOT 0.4 12/19/2018 1103       Assessment and Plan: 1. Type 2 diabetes mellitus without complication, with long-term current use of insulin (HCC) -I went over with patient the goal for fasting blood sugars which is 90-130. Refills  given on medications.  We have increased the Levemir by 3 units so she will be on 38 units twice a day.  Encourage healthy eating habits.  Encouraged her to exercise at least 4 days a week for 30 to 45 minutes.  Encouraged her to keep appointment for eye exam - glipiZIDE (GLUCOTROL XL) 10 MG 24 hr tablet; Take 1 tablet (10 mg total) by mouth daily with breakfast.  Dispense: 30 tablet; Refill: 1 - insulin detemir (LEVEMIR) 100 UNIT/ML FlexPen; Inject 38 Units into  the skin 2 (two) times daily.  Dispense: 15 mL; Refill: 1 - sitaGLIPtin (JANUVIA) 50 MG tablet; Take 1 tablet (50 mg total) by mouth daily.  Dispense: 30 tablet; Refill: 1 - Hemoglobin A1c; Future  2. Essential hypertension Level of control unknown.  She will need to follow-up with Dr. Juleen China in about 6 weeks for evaluation of blood pressure - amLODipine (NORVASC) 5 MG tablet; Take 1 tablet (5 mg total) by mouth daily.  Dispense: 30 tablet; Refill: 1 - triamterene-hydrochlorothiazide (MAXZIDE-25) 37.5-25 MG tablet; Take 1 tablet by mouth daily.  Dispense: 30 tablet; Refill: 1  3. Hyperlipidemia associated with type 2 diabetes mellitus (HCC) Refill atorvastatin  4. Lumbar radiculopathy Followed by orthopedics   Follow Up Instructions:    I discussed the assessment and treatment plan with the patient. The patient was provided an opportunity to ask questions and all were answered. The patient agreed with the plan and demonstrated an understanding of the instructions.   The patient was advised to call back or seek an in-person evaluation if the symptoms worsen or if the condition fails to improve as anticipated.  I provided 12 minutes of non-face-to-face time during this encounter.   Karle Plumber, MD

## 2019-09-26 ENCOUNTER — Encounter (HOSPITAL_COMMUNITY): Payer: Self-pay

## 2019-09-26 ENCOUNTER — Ambulatory Visit (HOSPITAL_COMMUNITY)
Admission: EM | Admit: 2019-09-26 | Discharge: 2019-09-26 | Disposition: A | Discharge status: Home / Self Care | Payer: Medicaid Other | Attending: Family Medicine | Admitting: Family Medicine

## 2019-09-26 ENCOUNTER — Other Ambulatory Visit: Payer: Self-pay

## 2019-09-26 DIAGNOSIS — M25559 Pain in unspecified hip: Secondary | ICD-10-CM | POA: Diagnosis not present

## 2019-09-26 LAB — POCT URINALYSIS DIP (DEVICE)
Bilirubin Urine: NEGATIVE
Glucose, UA: 500 mg/dL — AB
Ketones, ur: 15 mg/dL — AB
Leukocytes,Ua: NEGATIVE
Nitrite: NEGATIVE
Protein, ur: 30 mg/dL — AB
Specific Gravity, Urine: 1.025 (ref 1.005–1.030)
Urobilinogen, UA: 0.2 mg/dL (ref 0.0–1.0)
pH: 5.5 (ref 5.0–8.0)

## 2019-09-26 NOTE — ED Provider Notes (Signed)
[place]    CSN: [id number] Arrival date & time: 09/26/19  1109      History   Chief Complaint Chief Complaint  Patient presents with  . Pelvic Pain    HPI Ruth [name] is a 44 y.o. female.   Patient is a 44 year old female past medical history of anemia, arthritis, asthma, bipolar, fibromyalgia, migraines, hypertension, hypothyroidism, obesity, sleep apnea, diabetes type 2.  She presents today with central pelvic/suprapubic pain past 2 days.  Symptoms are intermittent.  The symptoms are only brought on by coughing, sneezing, laughing, going from lying to sitting.  Denies any associated vaginal discharge, itching or irritation.  Denies any dysuria, hematuria or urinary frequency's.  No fever, nausea or vomiting.  Reports she is having regular bowel movements.  Currently sexually active with husband.  Denies any concern for STDs.  History of tubal ligation. Patient's last menstrual period was 09/20/2019.  ROS per HPI      Past Medical History:  Diagnosis Date  . Anemia   . Arthritis    chronic back pain   . Asthma   . Bipolar 1 disorder ([place])   . Fibromyalgia   . Hx of migraines    occ  . Hypertension   . Hypothyroidism   . Obesity   . Sleep apnea   . Type 2 diabetes mellitus ([place])    Type 2    Patient Active Problem List   Diagnosis Date Noted  . Hyperlipidemia associated with type 2 diabetes mellitus ([place]) 09/19/2019  . Abnormal uterine bleeding (AUB) 12/07/2017  . Abnormal mammogram of both breasts 11/02/2016  . HYPOTHYROIDISM 06/21/2009  . Type 2 diabetes mellitus without complication, with long-term current use of insulin ([place]) 06/21/2009  . BIPOLAR AFFECTIVE DISORDER 06/21/2009  . 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 INADEQUATE SLEEP HYGIENE 06/21/2009  . OBSTRUCTIVE SLEEP APNEA 06/21/2009  . 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 CARPAL TUNNEL SYNDROME 06/20/2009  . MERALGIA PARESTHETICA 06/20/2009  . Essential  hypertension 06/20/2009  . FIBROMYALGIA 06/20/2009  . CEPHALGIA 06/20/2009    Past Surgical History:  Procedure Laterality Date  . CARPAL TUNNEL RELEASE Right   . CESAREAN SECTION     x2  . DILITATION & CURRETTAGE/HYSTROSCOPY WITH NOVASURE ABLATION N/A 12/07/2017   Procedure: DILATATION & CURETTAGE/HYSTEROSCOPY WITH ATTEMPED NOVASURE ABLATION, FAILED/ENDOMETERIAL ABLATION WITH HTA SYSTEM;  Surgeon: Lavonia Drafts, MD;  Location: [facility];  Service: Gynecology;  Laterality: N/A;  . ELBOW SURGERY     "surgery for nerve pain"  . GANGLION CYST EXCISION Right    right arm   . TONSILLECTOMY     childhood  . TUBAL LIGATION    . VAGINA SURGERY     fistula    OB History    Gravida  7   Para  5   Term  4   Preterm  1   AB  2   Living  5     SAB  2   TAB      Ectopic      Multiple      Live Births  5            Home Medications    Prior to Admission medications   Medication Sig Start Date End Date Taking? Authorizing Provider  Accu-Chek FastClix Lancets MISC E1 1.65, FOUR BLOOD GLUCOSE TESTING FASTING ANND TWO HOURS AFTER SUPPER 07/01/18   [provider]  ACCU-CHEK GUIDE test strip CHECK BLOOD SUGARS TWICE, E11.65 12/21/18   [provider]  albuterol (ACCUNEB) 0.63 MG/3ML nebulizer solution USE 1 VIAL VIA NEBULIZER EVERY 4-6 HOURS AS NEEDED 10/24/18   Scot Jun, FNP  albuterol (VENTOLIN HFA) 108 (90 Base) MCG/ACT inhaler Inhale 1-2 puffs into the lungs every 6 (six) hours as needed for wheezing. 10/24/18   Scot Jun, FNP  amLODipine (NORVASC) 5 MG tablet Take 1 tablet (5 mg total) by mouth daily. 09/19/19   Ladell Pier, MD  atorvastatin (LIPITOR) 20 MG tablet Take 1 tablet (20 mg total) by mouth daily at 6 PM. 09/19/19   Ladell Pier, MD  budesonide-formoterol [facility]) 160-4.5 MCG/ACT inhaler Inhale 2 puffs into the lungs 2 (two) times daily.    [provider]  cetirizine (ZYRTEC) 10 MG tablet Take 1  tablet (10 mg total) by mouth daily. 10/24/18   Scot Jun, FNP  cyclobenzaprine (FLEXERIL) 10 MG tablet Take 1 tablet (10 mg total) by mouth 2 (two) times daily as needed for muscle spasms. 08/30/19   Faustino Congress, NP  fluticasone (FLONASE) 50 MCG/ACT nasal spray Place 2 sprays into both nostrils daily. 10/24/18   Scot Jun, FNP  gabapentin (NEURONTIN) 300 MG capsule Take 1 capsule (300 mg total) by mouth at bedtime. 06/07/19   Vanessa Kick, MD  glipiZIDE (GLUCOTROL XL) 10 MG 24 hr tablet Take 1 tablet (10 mg total) by mouth daily with breakfast. 09/19/19   Ladell Pier, MD  insulin detemir (LEVEMIR) 100 UNIT/ML FlexPen Inject 38 Units into the skin 2 (two) times daily. 09/19/19   Ladell Pier, MD  Insulin Pen Needle (PEN NEEDLES) 32G X 4 MM MISC 1 each by Does not apply route 2 (two) times daily. 10/24/18   Scot Jun, FNP  lidocaine (LIDODERM) 5 % Place 1 patch onto the skin daily. Apply 1 patch to left lower back once per day. Remove & Discard patch within 12 hours. 09/04/19   Petrucelli, Samantha R, PA-C  methocarbamol (ROBAXIN) 500 MG tablet Take 1-2 tablets (500-1,000 mg total) by mouth 2 (two) times daily as needed for muscle spasms. 09/04/19   Petrucelli, Samantha R, PA-C  omeprazole (PRILOSEC) 20 MG capsule Take 20 mg by mouth 2 (two) times daily before a meal.    [provider]  sitaGLIPtin (JANUVIA) 50 MG tablet Take 1 tablet (50 mg total) by mouth daily. 09/19/19   Ladell Pier, MD  triamterene-hydrochlorothiazide (MAXZIDE-25) 37.5-25 MG tablet Take 1 tablet by mouth daily. 09/19/19   Ladell Pier, MD    Family History Family History  Problem Relation Age of Onset  . Thyroid disease Mother   . Diabetes Daughter   . Diabetes Son   . Breast cancer Neg Hx     Social History Social History   Tobacco Use  . Smoking status: Current Every Day Smoker    Packs/day: 0.25    Types: Cigarettes  . Smokeless tobacco: Never Used    Substance Use Topics  . Alcohol use: Yes    Comment: occasionally  . Drug use: Yes    Types: Marijuana     Allergies   Metformin and related, Penicillins, and Victoza [liraglutide]   Review of Systems Review of Systems   Physical Exam Triage Vital Signs ED Triage Vitals  Enc Vitals Group     BP 09/26/19 1121 (!) 154/95     Pulse Rate 09/26/19 1121 85     Resp 09/26/19 1121 16     Temp 09/26/19 1121 98.4 F (36.9  C)     Temp Source 09/26/19 1121 Oral     SpO2 09/26/19 1121 98 %     Weight --      Height --      Head Circumference --      Peak Flow --      Pain Score 09/26/19 1145 9     Pain Loc --      Pain Edu? --      Excl. in [place]? --    No data found.  Updated Vital Signs BP (!) 154/95 (BP Location: Left Arm)   Pulse 85   Temp 98.4 F (36.9 C) (Oral)   Resp 16   LMP 09/20/2019   SpO2 98%   Visual Acuity Right Eye Distance:   Left Eye Distance:   Bilateral Distance:    Right Eye Near:   Left Eye Near:    Bilateral Near:     Physical Exam Vitals and nursing note reviewed.  Constitutional:      General: She is not in acute distress.    Appearance: Normal appearance. She is obese. She is not ill-appearing, toxic-appearing or diaphoretic.  HENT:     Head: Normocephalic.     Nose: Nose normal.  Eyes:     Conjunctiva/sclera: Conjunctivae normal.  Pulmonary:     Effort: Pulmonary effort is normal.  Abdominal:     General: Abdomen is protuberant. There is no distension.     Palpations: Abdomen is soft.     Tenderness: There is abdominal tenderness in the suprapubic area. There is no right CVA tenderness, left CVA tenderness, guarding or rebound.     Hernia: No hernia is present.       Comments: TTP   Musculoskeletal:        General: Normal range of motion.     Cervical back: Normal range of motion.  Skin:    General: Skin is warm and dry.     Findings: No rash.  Neurological:     Mental Status: She is alert.  Psychiatric:        Mood and  Affect: Mood normal.      UC Treatments / Results  Labs (all labs ordered are listed, but only abnormal results are displayed) Labs Reviewed  POCT URINALYSIS DIP (DEVICE) - Abnormal; Notable for the following components:      Result Value   Glucose, UA 500 (*)    Ketones, ur 15 (*)    Hgb urine dipstick MODERATE (*)    Protein, ur 30 (*)    All other components within normal limits    EKG   Radiology No results found.  Procedures Procedures (including critical care time)  Medications Ordered in UC Medications - No data to display  Initial Impression / Assessment and Plan / UC Course  I have reviewed the triage vital signs and the nursing notes.  Pertinent labs & imaging results that were available during my care of the patient were reviewed by me and considered in my medical decision making (see chart for details).     Pelvic pain- most likely musculoskeletal.  Patient not having any other concerning signs or symptoms. VSS and non toxic or ill appearing.  Urine negative for infection.  Patient has moderate blood but this was similar to  previous urine samples. Recommended applying heat to the area and trying some ibuprofen and muscle relaxants she has at home to see if this helps. Otherwise if the pain worsens and she starts developing  any other concerning signs or symptoms she will need to go to the ER. Contact put discharge instructions for OB/GYN follow-up Final Clinical Impressions(s) / UC Diagnoses   Final diagnoses:  Pain in joint involving pelvic region and thigh, unspecified laterality     Discharge Instructions     Your urine did not show any infection Believe this is most likely a muscle issue. You can try doing some ibuprofen for pain We will try a low-dose muscle relaxant to see if this may help Try doing heat to the area. If this problem continues or worsens you will need to go to the ER for ultrasound or CAT scan. 2 contacts put on your  discharge directions for OB/GYN follow-up     ED Prescriptions    None     PDMP not reviewed this encounter.   Orvan July, NP 09/26/19 1251

## 2019-09-26 NOTE — Discharge Instructions (Addendum)
Your urine did not show any infection Believe this is most likely a muscle issue. You can try doing some ibuprofen for pain We will try a low-dose muscle relaxant to see if this may help Try doing heat to the area. If this problem continues or worsens you will need to go to the ER for ultrasound or CAT scan. 2 contacts put on your discharge directions for OB/GYN follow-up

## 2019-09-26 NOTE — ED Triage Notes (Signed)
Pt presents with central pelvic pain X 2 days.

## 2019-10-18 ENCOUNTER — Ambulatory Visit (HOSPITAL_COMMUNITY)
Admission: EM | Admit: 2019-10-18 | Discharge: 2019-10-18 | Disposition: A | Discharge status: Home / Self Care | Payer: Medicaid Other | Attending: Family Medicine | Admitting: Family Medicine

## 2019-10-18 ENCOUNTER — Encounter (HOSPITAL_COMMUNITY): Payer: Self-pay

## 2019-10-18 ENCOUNTER — Other Ambulatory Visit: Payer: Self-pay

## 2019-10-18 DIAGNOSIS — L0291 Cutaneous abscess, unspecified: Secondary | ICD-10-CM | POA: Diagnosis not present

## 2019-10-18 MED ORDER — DOXYCYCLINE HYCLATE 100 MG PO CAPS: 100.0000 mg | ORAL_CAPSULE | Freq: Two times a day (BID) | ORAL | 0 refills | Status: DC

## 2019-10-18 NOTE — Discharge Instructions (Addendum)
Keep doing the warm compresses, hot showers or bath.  Massage the area to promote drainage. Keep covered and dry Sending antibiotics to the pharmacy.  Take these as prescribed. Follow up as needed for continued or worsening symptoms

## 2019-10-18 NOTE — ED Triage Notes (Signed)
Pt c/o abscess on abdomen for approx 1 week; describes drainage of serosanguinous drainage on Monday. Denies fever, chills, n/v/d.  Large area of erythema around defined exudate/abscess area noted.

## 2019-10-19 NOTE — ED Provider Notes (Signed)
[place]    CSN: [id number] Arrival date & time: 10/18/19  1106      History   Chief Complaint Chief Complaint  Patient presents with  . Abscess    HPI Ruth [name] is a 44 y.o. female.   Patient is a 44 year old female with abscess to lower abdomen area for 1 week.  Reporting the area has been open and draining.  History of recurrent abscesses.  Last 1 being on the right upper abdomen.  This has resolved.  Denies any fever, chills, body aches, nausea, vomiting.  She has been keeping covered and doing warm compresses.  ROS per HPI      Past Medical History:  Diagnosis Date  . Anemia   . Arthritis    chronic back pain   . Asthma   . Bipolar 1 disorder ([place])   . Fibromyalgia   . Hx of migraines    occ  . Hypertension   . Hypothyroidism   . Obesity   . Sleep apnea   . Type 2 diabetes mellitus ([place])    Type 2    Patient Active Problem List   Diagnosis Date Noted  . Hyperlipidemia associated with type 2 diabetes mellitus ([place]) 09/19/2019  . Abnormal uterine bleeding (AUB) 12/07/2017  . Abnormal mammogram of both breasts 11/02/2016  . HYPOTHYROIDISM 06/21/2009  . Type 2 diabetes mellitus without complication, with long-term current use of insulin ([place]) 06/21/2009  . BIPOLAR AFFECTIVE DISORDER 06/21/2009  . 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 INADEQUATE SLEEP HYGIENE 06/21/2009  . OBSTRUCTIVE SLEEP APNEA 06/21/2009  . 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 CARPAL TUNNEL SYNDROME 06/20/2009  . MERALGIA PARESTHETICA 06/20/2009  . Essential hypertension 06/20/2009  . FIBROMYALGIA 06/20/2009  . CEPHALGIA 06/20/2009    Past Surgical History:  Procedure Laterality Date  . CARPAL TUNNEL RELEASE Right   . CESAREAN SECTION     x2  . DILITATION & CURRETTAGE/HYSTROSCOPY WITH NOVASURE ABLATION N/A 12/07/2017   Procedure: DILATATION & CURETTAGE/HYSTEROSCOPY WITH ATTEMPED NOVASURE ABLATION, FAILED/ENDOMETERIAL ABLATION WITH HTA  SYSTEM;  Surgeon: Lavonia Drafts, MD;  Location: [facility];  Service: Gynecology;  Laterality: N/A;  . ELBOW SURGERY     "surgery for nerve pain"  . GANGLION CYST EXCISION Right    right arm   . TONSILLECTOMY     childhood  . TUBAL LIGATION    . VAGINA SURGERY     fistula    OB History    Gravida  7   Para  5   Term  4   Preterm  1   AB  2   Living  5     SAB  2   TAB      Ectopic      Multiple      Live Births  5            Home Medications    Prior to Admission medications   Medication Sig Start Date End Date Taking? Authorizing Provider  Accu-Chek FastClix Lancets MISC E1 1.65, FOUR BLOOD GLUCOSE TESTING FASTING ANND TWO HOURS AFTER SUPPER 07/01/18   [provider]  ACCU-CHEK GUIDE test strip CHECK BLOOD SUGARS TWICE, E11.65 12/21/18   [provider]  albuterol (ACCUNEB) 0.63 MG/3ML nebulizer solution USE 1 VIAL VIA NEBULIZER EVERY 4-6 HOURS AS NEEDED 10/24/18   Scot Jun, FNP  albuterol (VENTOLIN HFA) 108 (90 Base) MCG/ACT inhaler Inhale 1-2 puffs into the lungs every 6 (six) hours as needed for wheezing.  10/24/18   Scot Jun, FNP  amLODipine (NORVASC) 5 MG tablet Take 1 tablet (5 mg total) by mouth daily. 09/19/19   Ladell Pier, MD  atorvastatin (LIPITOR) 20 MG tablet Take 1 tablet (20 mg total) by mouth daily at 6 PM. 09/19/19   Ladell Pier, MD  budesonide-formoterol [facility]) 160-4.5 MCG/ACT inhaler Inhale 2 puffs into the lungs 2 (two) times daily.    [provider]  cetirizine (ZYRTEC) 10 MG tablet Take 1 tablet (10 mg total) by mouth daily. 10/24/18   Scot Jun, FNP  cyclobenzaprine (FLEXERIL) 10 MG tablet Take 1 tablet (10 mg total) by mouth 2 (two) times daily as needed for muscle spasms. 08/30/19   Faustino Congress, NP  doxycycline (VIBRAMYCIN) 100 MG capsule Take 1 capsule (100 mg total) by mouth 2 (two) times daily. 10/18/19   Mehul Rudin, Tressia Miners A, NP  fluticasone (FLONASE) 50  MCG/ACT nasal spray Place 2 sprays into both nostrils daily. 10/24/18   Scot Jun, FNP  gabapentin (NEURONTIN) 300 MG capsule Take 1 capsule (300 mg total) by mouth at bedtime. 06/07/19   Vanessa Kick, MD  glipiZIDE (GLUCOTROL XL) 10 MG 24 hr tablet Take 1 tablet (10 mg total) by mouth daily with breakfast. 09/19/19   Ladell Pier, MD  insulin detemir (LEVEMIR) 100 UNIT/ML FlexPen Inject 38 Units into the skin 2 (two) times daily. 09/19/19   Ladell Pier, MD  Insulin Pen Needle (PEN NEEDLES) 32G X 4 MM MISC 1 each by Does not apply route 2 (two) times daily. 10/24/18   Scot Jun, FNP  lidocaine (LIDODERM) 5 % Place 1 patch onto the skin daily. Apply 1 patch to left lower back once per day. Remove & Discard patch within 12 hours. 09/04/19   Petrucelli, Samantha R, PA-C  methocarbamol (ROBAXIN) 500 MG tablet Take 1-2 tablets (500-1,000 mg total) by mouth 2 (two) times daily as needed for muscle spasms. 09/04/19   Petrucelli, Samantha R, PA-C  omeprazole (PRILOSEC) 20 MG capsule Take 20 mg by mouth 2 (two) times daily before a meal.    [provider]  predniSONE (STERAPRED UNI-PAK 21 TAB) 10 MG (21) TBPK tablet TAKE 1 DOSE PK(S) BY ORAL ROUTE. 09/05/19   [provider]  sitaGLIPtin (JANUVIA) 50 MG tablet Take 1 tablet (50 mg total) by mouth daily. 09/19/19   Ladell Pier, MD  triamterene-hydrochlorothiazide (MAXZIDE-25) 37.5-25 MG tablet Take 1 tablet by mouth daily. 09/19/19   Ladell Pier, MD    Family History Family History  Problem Relation Age of Onset  . Thyroid disease Mother   . Diabetes Daughter   . Diabetes Son   . Breast cancer Neg Hx     Social History Social History   Tobacco Use  . Smoking status: Current Every Day Smoker    Packs/day: 0.25    Types: Cigarettes  . Smokeless tobacco: Never Used  Substance Use Topics  . Alcohol use: Yes    Comment: occasionally  . Drug use: Yes    Types: Marijuana     Allergies     Metformin and related, Penicillins, and Victoza [liraglutide]   Review of Systems Review of Systems   Physical Exam Triage Vital Signs ED Triage Vitals  Enc Vitals Group     BP 10/18/19 1124 (!) 157/97     Pulse Rate 10/18/19 1124 98     Resp 10/18/19 1124 20     Temp 10/18/19 1124 98.3 F (36.8  C)     Temp Source 10/18/19 1124 Oral     SpO2 10/18/19 1124 99 %     Weight --      Height --      Head Circumference --      Peak Flow --      Pain Score 10/18/19 1121 0     Pain Loc --      Pain Edu? --      Excl. in [place]? --    No data found.  Updated Vital Signs BP (!) 157/97 (BP Location: Left Wrist)   Pulse 98   Temp 98.3 F (36.8 C) (Oral)   Resp 20   LMP 10/17/2019   SpO2 99%   Visual Acuity Right Eye Distance:   Left Eye Distance:   Bilateral Distance:    Right Eye Near:   Left Eye Near:    Bilateral Near:     Physical Exam Vitals and nursing note reviewed.  Constitutional:      General: She is not in acute distress.    Appearance: Normal appearance. She is not ill-appearing, toxic-appearing or diaphoretic.  HENT:     Head: Normocephalic.     Nose: Nose normal.  Eyes:     Conjunctiva/sclera: Conjunctivae normal.  Pulmonary:     Effort: Pulmonary effort is normal.  Abdominal:       Comments: Approximated 4 to 5 cm erythematous area with centered fluctuant opened area with purulent drainage and green scabbing   Musculoskeletal:        General: Normal range of motion.     Cervical back: Normal range of motion.  Skin:    General: Skin is warm and dry.     Findings: No rash.  Neurological:     Mental Status: She is alert.  Psychiatric:        Mood and Affect: Mood normal.      UC Treatments / Results  Labs (all labs ordered are listed, but only abnormal results are displayed) Labs Reviewed  AEROBIC CULTURE (SUPERFICIAL SPECIMEN)    EKG   Radiology No results found.  Procedures Procedures (including critical care  time)  Medications Ordered in UC Medications - No data to display  Initial Impression / Assessment and Plan / UC Course  I have reviewed the triage vital signs and the nursing notes.  Pertinent labs & imaging results that were available during my care of the patient were reviewed by me and considered in my medical decision making (see chart for details).     Abscess-area is already open up and draining.  We will have her keep doing warm compresses. Doxycycline for antibiotic coverage Wound culture sent for testing Follow up as needed for continued or worsening symptoms  Final Clinical Impressions(s) / UC Diagnoses   Final diagnoses:  Abscess     Discharge Instructions     Keep doing the warm compresses, hot showers or bath.  Massage the area to promote drainage. Keep covered and dry Sending antibiotics to the pharmacy.  Take these as prescribed. Follow up as needed for continued or worsening symptoms     ED Prescriptions    Medication Sig Dispense Auth. Provider   doxycycline (VIBRAMYCIN) 100 MG capsule Take 1 capsule (100 mg total) by mouth 2 (two) times daily. 20 capsule Loura Halt A, NP     PDMP not reviewed this encounter.   Loura Halt A, NP 10/19/19 1140

## 2019-10-21 LAB — AEROBIC CULTURE W GRAM STAIN (SUPERFICIAL SPECIMEN): Gram Stain: NONE SEEN

## 2020-01-02 ENCOUNTER — Emergency Department (HOSPITAL_COMMUNITY)
Admission: EM | Admit: 2020-01-02 | Discharge: 2020-01-02 | Discharge status: Against Medical Advice | Payer: Medicaid Other

## 2020-01-03 ENCOUNTER — Ambulatory Visit (INDEPENDENT_AMBULATORY_CARE_PROVIDER_SITE_OTHER): Payer: Medicaid Other

## 2020-01-03 ENCOUNTER — Other Ambulatory Visit: Payer: Self-pay

## 2020-01-03 ENCOUNTER — Ambulatory Visit (HOSPITAL_COMMUNITY)
Admission: EM | Admit: 2020-01-03 | Discharge: 2020-01-03 | Disposition: A | Discharge status: Home / Self Care | Payer: Medicaid Other | Attending: Urgent Care | Admitting: Urgent Care

## 2020-01-03 ENCOUNTER — Encounter (HOSPITAL_COMMUNITY): Payer: Self-pay

## 2020-01-03 DIAGNOSIS — R202 Paresthesia of skin: Secondary | ICD-10-CM | POA: Diagnosis not present

## 2020-01-03 DIAGNOSIS — W19XXXA Unspecified fall, initial encounter: Secondary | ICD-10-CM | POA: Diagnosis not present

## 2020-01-03 DIAGNOSIS — M79602 Pain in left arm: Secondary | ICD-10-CM

## 2020-01-03 DIAGNOSIS — M79642 Pain in left hand: Secondary | ICD-10-CM

## 2020-01-03 DIAGNOSIS — S60222A Contusion of left hand, initial encounter: Secondary | ICD-10-CM | POA: Diagnosis not present

## 2020-01-03 MED ORDER — NAPROXEN 500 MG PO TABS: 500.0000 mg | ORAL_TABLET | Freq: Two times a day (BID) | ORAL | 0 refills | Status: DC

## 2020-01-03 NOTE — ED Triage Notes (Signed)
Pt c/o pain to left hand and arm after falling while skating last night. Did not hit head.

## 2020-01-03 NOTE — ED Provider Notes (Signed)
[place]   MRN: [id number] DOB: 1975-11-26  Subjective:   Ruth [name] is a 44 y.o. female presenting for suffering a fall last night while skating.  Patient states that she fell very awkwardly and down into her left side injuring her thumb the worst.  She has since had persistent upper arm pain, tingling.  Has also had severe pain of her left thumb extending into her wrist.  Denies head injury, loss of consciousness, confusion, dizziness.  No current facility-administered medications for this encounter.  Current Outpatient Medications:  .  Accu-Chek FastClix Lancets MISC, E1 1.65, FOUR BLOOD GLUCOSE TESTING FASTING ANND TWO HOURS AFTER SUPPER, Disp: , Rfl:  .  ACCU-CHEK GUIDE test strip, CHECK BLOOD SUGARS TWICE, E11.65, Disp: , Rfl:  .  albuterol (ACCUNEB) 0.63 MG/3ML nebulizer solution, USE 1 VIAL VIA NEBULIZER EVERY 4-6 HOURS AS NEEDED, Disp: 75 mL, Rfl: 3 .  albuterol (VENTOLIN HFA) 108 (90 Base) MCG/ACT inhaler, Inhale 1-2 puffs into the lungs every 6 (six) hours as needed for wheezing., Disp: 1 Inhaler, Rfl: 3 .  amLODipine (NORVASC) 5 MG tablet, Take 1 tablet (5 mg total) by mouth daily., Disp: 30 tablet, Rfl: 1 .  atorvastatin (LIPITOR) 20 MG tablet, Take 1 tablet (20 mg total) by mouth daily at 6 PM., Disp: 30 tablet, Rfl: 1 .  budesonide-formoterol (SYMBICORT) 160-4.5 MCG/ACT inhaler, Inhale 2 puffs into the lungs 2 (two) times daily., Disp: , Rfl:  .  cetirizine (ZYRTEC) 10 MG tablet, Take 1 tablet (10 mg total) by mouth daily., Disp: 30 tablet, Rfl: 11 .  cyclobenzaprine (FLEXERIL) 10 MG tablet, Take 1 tablet (10 mg total) by mouth 2 (two) times daily as needed for muscle spasms., Disp: 20 tablet, Rfl: 0 .  fluticasone (FLONASE) 50 MCG/ACT nasal spray, Place 2 sprays into both nostrils daily., Disp: 16 g, Rfl: 12 .  gabapentin (NEURONTIN) 300 MG capsule, Take 1 capsule (300 mg total) by mouth at bedtime., Disp: 30 capsule, Rfl: 2 .  glipiZIDE (GLUCOTROL XL) 10  MG 24 hr tablet, Take 1 tablet (10 mg total) by mouth daily with breakfast., Disp: 30 tablet, Rfl: 1 .  insulin detemir (LEVEMIR) 100 UNIT/ML FlexPen, Inject 38 Units into the skin 2 (two) times daily., Disp: 15 mL, Rfl: 1 .  Insulin Pen Needle (PEN NEEDLES) 32G X 4 MM MISC, 1 each by Does not apply route 2 (two) times daily., Disp: 100 each, Rfl: 11 .  lidocaine (LIDODERM) 5 %, Place 1 patch onto the skin daily. Apply 1 patch to left lower back once per day. Remove & Discard patch within 12 hours., Disp: 30 patch, Rfl: 0 .  methocarbamol (ROBAXIN) 500 MG tablet, Take 1-2 tablets (500-1,000 mg total) by mouth 2 (two) times daily as needed for muscle spasms., Disp: 15 tablet, Rfl: 0 .  omeprazole (PRILOSEC) 20 MG capsule, Take 20 mg by mouth 2 (two) times daily before a meal., Disp: , Rfl:  .  sitaGLIPtin (JANUVIA) 50 MG tablet, Take 1 tablet (50 mg total) by mouth daily., Disp: 30 tablet, Rfl: 1 .  triamterene-hydrochlorothiazide (MAXZIDE-25) 37.5-25 MG tablet, Take 1 tablet by mouth daily., Disp: 30 tablet, Rfl: 1   Allergies  Allergen Reactions  . Metformin And Related     Intolerance   . Penicillins Other (See Comments)    Unknown; from childhood Has patient had a PCN reaction causing immediate rash, facial/tongue/throat swelling, SOB or lightheadedness with hypotension: Unknow Has patient had a PCN reaction causing severe  rash involving mucus membranes or skin necrosis: Unknown Has patient had a PCN reaction that required hospitalization: Unknown Has patient had a PCN reaction occurring within the last 10 years: Unknown If all of the above answers are "NO", then may proceed with Cephalosporin use.   [name] [Liraglutide]     Itching     Past Medical History:  Diagnosis Date  . Anemia   . Arthritis    chronic back pain   . Asthma   . Bipolar 1 disorder ([place])   . Fibromyalgia   . Hx of migraines    occ  . Hypertension   . Hypothyroidism   . Obesity   . Sleep apnea   . Type  2 diabetes mellitus (HCC)    Type 2     Past Surgical History:  Procedure Laterality Date  . CARPAL TUNNEL RELEASE Right   . CESAREAN SECTION     x2  . DILITATION & CURRETTAGE/HYSTROSCOPY WITH NOVASURE ABLATION N/A 12/07/2017   Procedure: DILATATION & CURETTAGE/HYSTEROSCOPY WITH ATTEMPED NOVASURE ABLATION, FAILED/ENDOMETERIAL ABLATION WITH HTA SYSTEM;  Surgeon: Lavonia Drafts, MD;  Location: [facility];  Service: Gynecology;  Laterality: N/A;  . ELBOW SURGERY     "surgery for nerve pain"  . GANGLION CYST EXCISION Right    right arm   . TONSILLECTOMY     childhood  . TUBAL LIGATION    . VAGINA SURGERY     fistula    Family History  Problem Relation Age of Onset  . Thyroid disease Mother   . Diabetes Daughter   . Diabetes Son   . Breast cancer Neg Hx     Social History   Tobacco Use  . Smoking status: Current Every Day Smoker    Packs/day: 0.25    Types: Cigarettes  . Smokeless tobacco: Never Used  Vaping Use  . Vaping Use: Never used  Substance Use Topics  . Alcohol use: Yes    Comment: occasionally  . Drug use: Yes    Types: Marijuana    ROS   Objective:   Vitals: BP 135/87   Pulse 87   Temp (!) 97 F (36.1 C)   Resp 16   LMP 12/11/2019   SpO2 97%   Physical Exam Constitutional:      General: She is not in acute distress.    Appearance: Normal appearance. She is well-developed. She is obese. She is not ill-appearing, toxic-appearing or diaphoretic.  HENT:     Head: Normocephalic and atraumatic.     Nose: Nose normal.     Mouth/Throat:     Mouth: Mucous membranes are moist.     Pharynx: Oropharynx is clear.  Eyes:     General: No scleral icterus.       Right eye: No discharge.        Left eye: No discharge.     Extraocular Movements: Extraocular movements intact.     Pupils: Pupils are equal, round, and reactive to light.  Cardiovascular:     Rate and Rhythm: Normal rate.  Pulmonary:     Effort: Pulmonary effort is normal.    Musculoskeletal:     Left upper arm: No swelling, edema, deformity, lacerations, tenderness or bony tenderness.     Left elbow: No swelling, deformity, effusion or lacerations. Normal range of motion. No tenderness.     Left forearm: No swelling, edema, deformity, lacerations, tenderness or bony tenderness.     Left wrist: Snuff box tenderness present. No swelling, deformity, effusion,  lacerations, tenderness, bony tenderness or crepitus. Normal range of motion.     Left hand: Swelling (trace over area outlined), tenderness (over area outlined) and bony tenderness present. No deformity or lacerations. Decreased range of motion. Normal sensation. There is no disruption of two-point discrimination. Normal capillary refill.       Hands:  Skin:    General: Skin is warm and dry.  Neurological:     General: No focal deficit present.     Mental Status: She is alert and oriented to person, place, and time.  Psychiatric:        Mood and Affect: Mood normal.        Behavior: Behavior normal.        Thought Content: Thought content normal.        Judgment: Judgment normal.     DG Hand Complete Left  Result Date: 01/03/2020 CLINICAL DATA:  Left hand pain after falling on left arm. Pain localizing to first digit. EXAM: LEFT HAND - COMPLETE 3+ VIEW COMPARISON:  None. FINDINGS: There is no evidence of fracture or dislocation. There is no evidence of arthropathy or other focal bone abnormality. Soft tissues are unremarkable. IMPRESSION: Negative. Electronically Signed   By: Kerby Moors M.D.   On: 01/03/2020 09:14    Assessment and Plan :   PDMP not reviewed this encounter.  1. Left hand pain   2. Contusion of left hand, initial encounter   3. Fall, initial encounter   4. Left arm pain   5. Tingling of left upper extremity     Will manage conservatively with Ace wrap, provided to the patient in clinic, Naprosyn and rest. Counseled patient on potential for adverse effects with medications  prescribed/recommended today, ER and return-to-clinic precautions discussed, patient verbalized understanding.    Jaynee Eagles, [place] 01/03/20 [phone]

## 2020-01-24 ENCOUNTER — Telehealth: Payer: Self-pay

## 2020-01-24 NOTE — Telephone Encounter (Signed)

## 2020-01-25 ENCOUNTER — Ambulatory Visit (INDEPENDENT_AMBULATORY_CARE_PROVIDER_SITE_OTHER): Payer: Medicaid Other | Admitting: Internal Medicine

## 2020-01-25 ENCOUNTER — Encounter: Payer: Self-pay | Admitting: Internal Medicine

## 2020-01-25 ENCOUNTER — Other Ambulatory Visit: Payer: Self-pay

## 2020-01-25 VITALS — BP 134/92 | HR 90 | Temp 97.5°F | Resp 18 | Wt 304.0 lb

## 2020-01-25 DIAGNOSIS — J4520 Mild intermittent asthma, uncomplicated: Secondary | ICD-10-CM | POA: Diagnosis not present

## 2020-01-25 DIAGNOSIS — E785 Hyperlipidemia, unspecified: Secondary | ICD-10-CM | POA: Diagnosis not present

## 2020-01-25 DIAGNOSIS — E1169 Type 2 diabetes mellitus with other specified complication: Secondary | ICD-10-CM

## 2020-01-25 DIAGNOSIS — Z1159 Encounter for screening for other viral diseases: Secondary | ICD-10-CM

## 2020-01-25 DIAGNOSIS — Z114 Encounter for screening for human immunodeficiency virus [HIV]: Secondary | ICD-10-CM | POA: Diagnosis not present

## 2020-01-25 DIAGNOSIS — Z1231 Encounter for screening mammogram for malignant neoplasm of breast: Secondary | ICD-10-CM

## 2020-01-25 DIAGNOSIS — E119 Type 2 diabetes mellitus without complications: Secondary | ICD-10-CM | POA: Diagnosis not present

## 2020-01-25 DIAGNOSIS — E1142 Type 2 diabetes mellitus with diabetic polyneuropathy: Secondary | ICD-10-CM | POA: Diagnosis not present

## 2020-01-25 DIAGNOSIS — Z23 Encounter for immunization: Secondary | ICD-10-CM | POA: Diagnosis not present

## 2020-01-25 DIAGNOSIS — Z794 Long term (current) use of insulin: Secondary | ICD-10-CM | POA: Diagnosis not present

## 2020-01-25 DIAGNOSIS — I10 Essential (primary) hypertension: Secondary | ICD-10-CM | POA: Diagnosis not present

## 2020-01-25 DIAGNOSIS — M25531 Pain in right wrist: Secondary | ICD-10-CM

## 2020-01-25 DIAGNOSIS — E039 Hypothyroidism, unspecified: Secondary | ICD-10-CM

## 2020-01-25 LAB — POCT GLYCOSYLATED HEMOGLOBIN (HGB A1C): Hemoglobin A1C: 10.7 % — AB (ref 4.0–5.6)

## 2020-01-25 MED ORDER — CETIRIZINE HCL 10 MG PO TABS: 10.0000 mg | ORAL_TABLET | Freq: Every day | ORAL | 11 refills | Status: DC

## 2020-01-25 MED ORDER — BUDESONIDE-FORMOTEROL FUMARATE 160-4.5 MCG/ACT IN AERO: 2.0000 | INHALATION_SPRAY | Freq: Two times a day (BID) | RESPIRATORY_TRACT | 5 refills | Status: DC

## 2020-01-25 MED ORDER — PEN NEEDLES 32G X 4 MM MISC: 1.0000 | Freq: Two times a day (BID) | 11 refills | Status: DC

## 2020-01-25 MED ORDER — ALBUTEROL SULFATE HFA 108 (90 BASE) MCG/ACT IN AERS: 1.0000 | INHALATION_SPRAY | Freq: Four times a day (QID) | RESPIRATORY_TRACT | 5 refills | Status: DC | PRN

## 2020-01-25 MED ORDER — AMLODIPINE BESYLATE 5 MG PO TABS: 5.0000 mg | ORAL_TABLET | Freq: Every day | ORAL | 1 refills | Status: DC

## 2020-01-25 MED ORDER — ATORVASTATIN CALCIUM 20 MG PO TABS: 20.0000 mg | ORAL_TABLET | Freq: Every day | ORAL | 1 refills | Status: DC

## 2020-01-25 MED ORDER — GLIPIZIDE ER 10 MG PO TB24: 10.0000 mg | ORAL_TABLET | Freq: Every day | ORAL | 1 refills | Status: DC

## 2020-01-25 MED ORDER — ALBUTEROL SULFATE 0.63 MG/3ML IN NEBU: INHALATION_SOLUTION | RESPIRATORY_TRACT | 3 refills | Status: DC

## 2020-01-25 MED ORDER — TRIAMTERENE-HCTZ 37.5-25 MG PO TABS: 1.0000 | ORAL_TABLET | Freq: Every day | ORAL | 1 refills | Status: DC

## 2020-01-25 MED ORDER — SITAGLIPTIN PHOSPHATE 100 MG PO TABS: 100.0000 mg | ORAL_TABLET | Freq: Every day | ORAL | 1 refills | Status: DC

## 2020-01-25 MED ORDER — INSULIN DETEMIR 100 UNIT/ML FLEXPEN: 38.0000 [IU] | PEN_INJECTOR | Freq: Two times a day (BID) | SUBCUTANEOUS | 1 refills | Status: DC

## 2020-01-25 MED ORDER — GABAPENTIN 300 MG PO CAPS: 300.0000 mg | ORAL_CAPSULE | Freq: Every day | ORAL | 2 refills | Status: DC

## 2020-01-25 NOTE — Progress Notes (Signed)
Subjective:    Ruth [name] - 44 y.o. female MRN [id number]  Date of birth: June 23, 1976  HPI  Ruth [name] is here for follow up of chronic medical conditions.  Diabetes mellitus, Type 2 Disease Monitoring             Blood Sugar Ranges: Does not monitor but has supplies and knows how to use them.             Polyuria: no             Visual problems: no   Urine Microalbumin 8 (March 2020)   Last A1C: 10.3 (June 2020)   Medications: Januvia 50 mg, Levemir 38u BID, Glipizide 10 mg  Medication Compliance: yes  Medication Side Effects             Hypoglycemia: no    Chronic HTN Disease Monitoring:  Home BP Monitoring - Does not have a cuff Chest pain- no  Dyspnea- no Headache - no  Medications: Triamterene-HCTZ 37.5-25 mg, Amlodipine 5 mg  Compliance- yes Lightheadedness- no  Edema- no    Reports chronic right wrist pain with intermittent edema. Had a ganglion cyst removed from that area is concerned that she is developing another one.      Health Maintenance:  Health Maintenance Due  Topic Date Due  . Hepatitis C Screening  Never done  . PNEUMOCOCCAL POLYSACCHARIDE VACCINE AGE 56-64 HIGH RISK  Never done  . OPHTHALMOLOGY EXAM  Never done  . HIV Screening  Never done  . HEMOGLOBIN A1C  06/20/2019  . URINE MICROALBUMIN  09/16/2019  . FOOT EXAM  12/19/2019    -  reports that she has been smoking cigarettes. She has been smoking about 0.25 packs per day. She has never used smokeless tobacco. - Review of Systems: Per HPI. - Past Medical History: Patient Active Problem List   Diagnosis Date Noted  . Hyperlipidemia associated with type 2 diabetes mellitus ([place]) 09/19/2019  . Abnormal uterine bleeding (AUB) 12/07/2017  . Abnormal mammogram of both breasts 11/02/2016  . Hypothyroidism 06/21/2009  . Type 2 diabetes mellitus without complication, with long-term current use of insulin ([place]) 06/21/2009  . BIPOLAR AFFECTIVE DISORDER 06/21/2009   . 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 INADEQUATE SLEEP HYGIENE 06/21/2009  . OBSTRUCTIVE SLEEP APNEA 06/21/2009  . 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 CARPAL TUNNEL SYNDROME 06/20/2009  . MERALGIA PARESTHETICA 06/20/2009  . Essential hypertension 06/20/2009  . FIBROMYALGIA 06/20/2009  . CEPHALGIA 06/20/2009   - Medications: reviewed and updated   Objective:   Physical Exam BP (!) 134/92   Pulse 90   Temp (!) 97.5 F (36.4 C) (Temporal)   Resp 18   Wt (!) 304 lb (137.9 kg)   SpO2 96%   BMI 49.07 kg/m  Physical Exam Constitutional:      General: She is not in acute distress.    Appearance: She is not diaphoretic.  HENT:     Head: Normocephalic and atraumatic.  Eyes:     Conjunctiva/sclera: Conjunctivae normal.  Cardiovascular:     Rate and Rhythm: Normal rate and regular rhythm.     Heart sounds: Normal heart sounds. No murmur heard.   Pulmonary:     Effort: Pulmonary effort is normal. No respiratory distress.     Breath sounds: Normal breath sounds.  Musculoskeletal:        General: Normal range of motion.     Comments: Right wrist with keloid scar formation. Unable to palpate any  defined cystic lesion. Minimal edema and TTP present.   Skin:    General: Skin is warm and dry.  Neurological:     Mental Status: She is alert and oriented to person, place, and time.  Psychiatric:        Mood and Affect: Affect normal.        Judgment: Judgment normal.            Assessment & Plan:   1. Type 2 diabetes mellitus without complication, with long-term current use of insulin (HCC) A1c 10.7, showing poor glycemic control. Increase Januvia to 100 mg daily. Return in 4-6 weeks with blood glucose log and will likely adjust Levemir dosing at that time.  Counseled on Diabetic diet, my plate method, 500 minutes of moderate intensity exercise/week Blood sugar logs with fasting goals of 80-120 mg/dl, random of less than 180 and in the event of sugars  less than 60 mg/dl or greater than 400 mg/dl encouraged to notify the clinic. Advised on the need for annual eye exams, annual foot exams, Pneumonia vaccine. - HgB A1c - HM Diabetes Foot Exam - Microalbumin/Creatinine Ratio, Urine - Ambulatory referral to Ophthalmology - glipiZIDE (GLUCOTROL XL) 10 MG 24 hr tablet; Take 1 tablet (10 mg total) by mouth daily with breakfast.  Dispense: 30 tablet; Refill: 1 - insulin detemir (LEVEMIR) 100 UNIT/ML FlexPen; Inject 38 Units into the skin 2 (two) times daily.  Dispense: 15 mL; Refill: 1 - Insulin Pen Needle (PEN NEEDLES) 32G X 4 MM MISC; 1 each by Does not apply route 2 (two) times daily.  Dispense: 100 each; Refill: 11 - sitaGLIPtin (JANUVIA) 100 MG tablet; Take 1 tablet (100 mg total) by mouth daily.  Dispense: 30 tablet; Refill: 1  2. Essential hypertension BP fairly well controlled today. Continue current regimen.  Counseled on blood pressure goal of less than 130/80, low-sodium, DASH diet, medication compliance, 150 minutes of moderate intensity exercise per week. Discussed medication compliance, adverse effects. - CBC with Differential - Comprehensive metabolic panel - amLODipine (NORVASC) 5 MG tablet; Take 1 tablet (5 mg total) by mouth daily.  Dispense: 30 tablet; Refill: 1 - triamterene-hydrochlorothiazide (MAXZIDE-25) 37.5-25 MG tablet; Take 1 tablet by mouth daily.  Dispense: 30 tablet; Refill: 1  3. Hyperlipidemia associated with type 2 diabetes mellitus (HCC) Last LDL >200. Will repeat lipid panel and determine if dose increase of Lipitor is warranted.  - atorvastatin (LIPITOR) 20 MG tablet; Take 1 tablet (20 mg total) by mouth daily at 6 PM.  Dispense: 30 tablet; Refill: 1 - Lipid panel  4. Encounter for screening for HIV - HIV antibody (with reflex)  5. Need for hepatitis C screening test - Hepatitis C Antibody  6. Need for pneumococcal vaccine Counseled on importance of PNA vaccination for diabetes. Declined today due to fear  of needles.   7. Hypothyroidism, unspecified type Reports prior history of hypothyroidism on Synthroid. Medication was discontinued. Reviewed that TSH trend has been stable over the past few years. Repeat screen today.  - TSH  8. Diabetic polyneuropathy associated with type 2 diabetes mellitus (HCC) - gabapentin (NEURONTIN) 300 MG capsule; Take 1 capsule (300 mg total) by mouth at bedtime.  Dispense: 30 capsule; Refill: 2  9. Mild intermittent asthma, unspecified whether complicated - albuterol (ACCUNEB) 0.63 MG/3ML nebulizer solution; USE 1 VIAL VIA NEBULIZER EVERY 4-6 HOURS AS NEEDED  Dispense: 75 mL; Refill: 3 - albuterol (VENTOLIN HFA) 108 (90 Base) MCG/ACT inhaler; Inhale 1-2 puffs into the lungs every 6 (  six) hours as needed for wheezing.  Dispense: 18 g; Refill: 5 - budesonide-formoterol (SYMBICORT) 160-4.5 MCG/ACT inhaler; Inhale 2 puffs into the lungs 2 (two) times daily.  Dispense: 1 Inhaler; Refill: 5  10. Encounter for screening mammogram for malignant neoplasm of breast - MM DIGITAL SCREENING BILATERAL; Future  11. Right wrist pain Significant keloid scar present which may be contributing to her symptoms. Ultrasound to determine if cyst is present underneath scar tissue.  - US SOFT TISSUE RT UPPER EXTREMITY LTD (NON-VASCULAR); Future    Phill Myron, D.O. 01/25/2020, 2:19 PM Primary Care at [facility]

## 2020-01-26 ENCOUNTER — Other Ambulatory Visit: Payer: Self-pay | Admitting: Internal Medicine

## 2020-01-26 DIAGNOSIS — D75839 Thrombocytosis, unspecified: Secondary | ICD-10-CM

## 2020-01-26 DIAGNOSIS — E1169 Type 2 diabetes mellitus with other specified complication: Secondary | ICD-10-CM

## 2020-01-26 DIAGNOSIS — D473 Essential (hemorrhagic) thrombocythemia: Secondary | ICD-10-CM | POA: Insufficient documentation

## 2020-01-26 HISTORY — DX: Thrombocytosis, unspecified: D75.839

## 2020-01-26 LAB — CBC WITH DIFFERENTIAL/PLATELET
Basophils Absolute: 0 10*3/uL (ref 0.0–0.2)
Basos: 0 %
EOS (ABSOLUTE): 0.1 10*3/uL (ref 0.0–0.4)
Eos: 2 %
Hematocrit: 33.8 % — ABNORMAL LOW (ref 34.0–46.6)
Hemoglobin: 11.1 g/dL (ref 11.1–15.9)
Immature Grans (Abs): 0 10*3/uL (ref 0.0–0.1)
Immature Granulocytes: 0 %
Lymphocytes Absolute: 1.9 10*3/uL (ref 0.7–3.1)
Lymphs: 37 %
MCH: 25.9 pg — ABNORMAL LOW (ref 26.6–33.0)
MCHC: 32.8 g/dL (ref 31.5–35.7)
MCV: 79 fL (ref 79–97)
Monocytes Absolute: 0.3 10*3/uL (ref 0.1–0.9)
Monocytes: 5 %
Neutrophils Absolute: 2.8 10*3/uL (ref 1.4–7.0)
Neutrophils: 56 %
Platelets: 482 10*3/uL — ABNORMAL HIGH (ref 150–450)
RBC: 4.28 x10E6/uL (ref 3.77–5.28)
RDW: 14.1 % (ref 11.7–15.4)
WBC: 5.1 10*3/uL (ref 3.4–10.8)

## 2020-01-26 LAB — COMPREHENSIVE METABOLIC PANEL
ALT: 8 IU/L (ref 0–32)
AST: 12 IU/L (ref 0–40)
Albumin/Globulin Ratio: 1.4 (ref 1.2–2.2)
Albumin: 4.3 g/dL (ref 3.8–4.8)
Alkaline Phosphatase: 104 IU/L (ref 48–121)
BUN/Creatinine Ratio: 11 (ref 9–23)
BUN: 7 mg/dL (ref 6–24)
Bilirubin Total: 0.3 mg/dL (ref 0.0–1.2)
CO2: 24 mmol/L (ref 20–29)
Calcium: 9.5 mg/dL (ref 8.7–10.2)
Chloride: 100 mmol/L (ref 96–106)
Creatinine, Ser: 0.64 mg/dL (ref 0.57–1.00)
GFR calc Af Amer: 124 mL/min/{1.73_m2} (ref >59)
GFR calc non Af Amer: 107 mL/min/{1.73_m2} (ref >59)
Globulin, Total: 3 g/dL (ref 1.5–4.5)
Glucose: 165 mg/dL — ABNORMAL HIGH (ref 65–99)
Potassium: 4.1 mmol/L (ref 3.5–5.2)
Sodium: 138 mmol/L (ref 134–144)
Total Protein: 7.3 g/dL (ref 6.0–8.5)

## 2020-01-26 LAB — LIPID PANEL
Chol/HDL Ratio: 5.9 ratio — ABNORMAL HIGH (ref 0.0–4.4)
Cholesterol, Total: 277 mg/dL — ABNORMAL HIGH (ref 100–199)
HDL: 47 mg/dL (ref >39)
LDL Chol Calc (NIH): 204 mg/dL — ABNORMAL HIGH (ref 0–99)
Triglycerides: 139 mg/dL (ref 0–149)
VLDL Cholesterol Cal: 26 mg/dL (ref 5–40)

## 2020-01-26 LAB — HEPATITIS C ANTIBODY: Hep C Virus Ab: <0.1 s/co ratio (ref 0.0–0.9)

## 2020-01-26 LAB — TSH: TSH: 1.64 u[IU]/mL (ref 0.450–4.500)

## 2020-01-26 LAB — HIV ANTIBODY (ROUTINE TESTING W REFLEX): HIV Screen 4th Generation wRfx: NONREACTIVE

## 2020-01-26 LAB — MICROALBUMIN / CREATININE URINE RATIO
Creatinine, Urine: 172 mg/dL
Microalb/Creat Ratio: 11 mg/g creat (ref 0–29)
Microalbumin, Urine: 19.7 ug/mL

## 2020-01-26 MED ORDER — ATORVASTATIN CALCIUM 80 MG PO TABS: 80.0000 mg | ORAL_TABLET | Freq: Every day | ORAL | 1 refills | Status: DC

## 2020-02-06 ENCOUNTER — Other Ambulatory Visit: Payer: Self-pay

## 2020-02-06 ENCOUNTER — Ambulatory Visit
Admission: RE | Admit: 2020-02-06 | Discharge: 2020-02-06 | Disposition: A | Discharge status: Home / Self Care | Payer: Medicaid Other | Source: Ambulatory Visit | Attending: Internal Medicine | Admitting: Internal Medicine

## 2020-02-06 DIAGNOSIS — Z1231 Encounter for screening mammogram for malignant neoplasm of breast: Secondary | ICD-10-CM

## 2020-02-07 ENCOUNTER — Other Ambulatory Visit: Payer: Self-pay | Admitting: Internal Medicine

## 2020-02-07 DIAGNOSIS — R921 Mammographic calcification found on diagnostic imaging of breast: Secondary | ICD-10-CM

## 2020-02-14 ENCOUNTER — Encounter (HOSPITAL_COMMUNITY): Payer: Self-pay

## 2020-02-14 ENCOUNTER — Other Ambulatory Visit: Payer: Self-pay

## 2020-02-14 ENCOUNTER — Ambulatory Visit (HOSPITAL_COMMUNITY)
Admission: EM | Admit: 2020-02-14 | Discharge: 2020-02-14 | Disposition: A | Discharge status: Home / Self Care | Payer: Medicaid Other | Attending: Family Medicine | Admitting: Family Medicine

## 2020-02-14 ENCOUNTER — Ambulatory Visit (INDEPENDENT_AMBULATORY_CARE_PROVIDER_SITE_OTHER): Payer: Medicaid Other

## 2020-02-14 DIAGNOSIS — M79601 Pain in right arm: Secondary | ICD-10-CM

## 2020-02-14 DIAGNOSIS — M25411 Effusion, right shoulder: Secondary | ICD-10-CM | POA: Diagnosis not present

## 2020-02-14 DIAGNOSIS — M25511 Pain in right shoulder: Secondary | ICD-10-CM | POA: Diagnosis not present

## 2020-02-14 MED ORDER — HYDROCODONE-ACETAMINOPHEN 5-325 MG PO TABS: 2.0000 | ORAL_TABLET | ORAL | 0 refills | Status: DC | PRN

## 2020-02-14 MED ORDER — KETOROLAC TROMETHAMINE 60 MG/2ML IM SOLN
INTRAMUSCULAR | Status: AC
  Filled 2020-02-14: qty 2

## 2020-02-14 MED ORDER — KETOROLAC TROMETHAMINE 60 MG/2ML IM SOLN
60.0000 mg | Freq: Once | INTRAMUSCULAR | Status: AC
  Administered 2020-02-14: 60 mg via INTRAMUSCULAR

## 2020-02-14 NOTE — ED Triage Notes (Signed)
Pt presents with right side arm pain & swelling since yesterday not injury related.

## 2020-02-14 NOTE — ED Provider Notes (Signed)
[place]    CSN: [id number] Arrival date & time: 02/14/20  1538      History   Chief Complaint Chief Complaint  Patient presents with  . Arm Pain  . Arm Swelling    HPI Ruth [name] is a 44 y.o. female patient presents with unilateral swelling involving her right upper arm.  There is been no history of injury or trauma.  She has not had any breast surgery.  She had previous swelling like this and was told it was "her nerves."  There is pain associated with the swelling.  Pain seems to localize around the shoulder but extends down toward the elbow.  I cannot get her to do any range of motion.   HPI  Past Medical History:  Diagnosis Date  . Anemia   . Arthritis    chronic back pain   . Asthma   . Bipolar 1 disorder ([place])   . Fibromyalgia   . Hx of migraines    occ  . Hypertension   . Hypothyroidism   . Obesity   . Sleep apnea   . Type 2 diabetes mellitus ([place])    Type 2    Patient Active Problem List   Diagnosis Date Noted  . Thrombocytosis ([place]) 01/26/2020  . Hyperlipidemia associated with type 2 diabetes mellitus ([place]) 09/19/2019  . Abnormal uterine bleeding (AUB) 12/07/2017  . Abnormal mammogram of both breasts 11/02/2016  . Hypothyroidism 06/21/2009  . Type 2 diabetes mellitus without complication, with long-term current use of insulin ([place]) 06/21/2009  . BIPOLAR AFFECTIVE DISORDER 06/21/2009  . 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 INADEQUATE SLEEP HYGIENE 06/21/2009  . OBSTRUCTIVE SLEEP APNEA 06/21/2009  . 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 CARPAL TUNNEL SYNDROME 06/20/2009  . MERALGIA PARESTHETICA 06/20/2009  . Essential hypertension 06/20/2009  . FIBROMYALGIA 06/20/2009  . CEPHALGIA 06/20/2009    Past Surgical History:  Procedure Laterality Date  . CARPAL TUNNEL RELEASE Right   . CESAREAN SECTION     x2  . DILITATION & CURRETTAGE/HYSTROSCOPY WITH NOVASURE ABLATION N/A 12/07/2017   Procedure:  DILATATION & CURETTAGE/HYSTEROSCOPY WITH ATTEMPED NOVASURE ABLATION, FAILED/ENDOMETERIAL ABLATION WITH HTA SYSTEM;  Surgeon: Lavonia Drafts, MD;  Location: [facility];  Service: Gynecology;  Laterality: N/A;  . ELBOW SURGERY     "surgery for nerve pain"  . GANGLION CYST EXCISION Right    right arm   . TONSILLECTOMY     childhood  . TUBAL LIGATION    . VAGINA SURGERY     fistula    OB History    Gravida  7   Para  5   Term  4   Preterm  1   AB  2   Living  5     SAB  2   TAB      Ectopic      Multiple      Live Births  5            Home Medications    Prior to Admission medications   Medication Sig Start Date End Date Taking? Authorizing Provider  Accu-Chek FastClix Lancets MISC E1 1.65, FOUR BLOOD GLUCOSE TESTING FASTING ANND TWO HOURS AFTER SUPPER 07/01/18   [provider]  ACCU-CHEK GUIDE test strip CHECK BLOOD SUGARS TWICE, E11.65 12/21/18   [provider]  albuterol (ACCUNEB) 0.63 MG/3ML nebulizer solution USE 1 VIAL VIA NEBULIZER EVERY 4-6 HOURS AS NEEDED 01/25/20   Nicolette Bang, DO  albuterol (VENTOLIN HFA) 108 (  90 Base) MCG/ACT inhaler Inhale 1-2 puffs into the lungs every 6 (six) hours as needed for wheezing. 01/25/20   Nicolette Bang, DO  amLODipine (NORVASC) 5 MG tablet Take 1 tablet (5 mg total) by mouth daily. 01/25/20   Nicolette Bang, DO  atorvastatin (LIPITOR) 80 MG tablet Take 1 tablet (80 mg total) by mouth daily at 6 PM. 01/26/20   Nicolette Bang, DO  budesonide-formoterol [facility]) 160-4.5 MCG/ACT inhaler Inhale 2 puffs into the lungs 2 (two) times daily. 01/25/20   Nicolette Bang, DO  cetirizine (ZYRTEC) 10 MG tablet Take 1 tablet (10 mg total) by mouth daily. 01/25/20   Nicolette Bang, DO  gabapentin (NEURONTIN) 300 MG capsule Take 1 capsule (300 mg total) by mouth at bedtime. 01/25/20   Nicolette Bang, DO  glipiZIDE (GLUCOTROL XL) 10 MG 24 hr  tablet Take 1 tablet (10 mg total) by mouth daily with breakfast. 01/25/20   Nicolette Bang, DO  insulin detemir (LEVEMIR) 100 UNIT/ML FlexPen Inject 38 Units into the skin 2 (two) times daily. 01/25/20   Nicolette Bang, DO  Insulin Pen Needle (PEN NEEDLES) 32G X 4 MM MISC 1 each by Does not apply route 2 (two) times daily. 01/25/20   Nicolette Bang, DO  omeprazole (PRILOSEC) 20 MG capsule Take 20 mg by mouth 2 (two) times daily before a meal.    [provider]  sitaGLIPtin (JANUVIA) 100 MG tablet Take 1 tablet (100 mg total) by mouth daily. 01/25/20   Nicolette Bang, DO  triamterene-hydrochlorothiazide (MAXZIDE-25) 37.5-25 MG tablet Take 1 tablet by mouth daily. 01/25/20   Nicolette Bang, DO    Family History Family History  Problem Relation Age of Onset  . Thyroid disease Mother   . Diabetes Daughter   . Diabetes Son   . Breast cancer Neg Hx     Social History Social History   Tobacco Use  . Smoking status: Current Every Day Smoker    Packs/day: 0.25    Types: Cigarettes  . Smokeless tobacco: Never Used  Vaping Use  . Vaping Use: Never used  Substance Use Topics  . Alcohol use: Yes    Comment: occasionally  . Drug use: Yes    Types: Marijuana     Allergies   Metformin and related, Penicillins, and Victoza [liraglutide]   Review of Systems Review of Systems  Musculoskeletal: Positive for arthralgias.  All other systems reviewed and are negative.    Physical Exam Triage Vital Signs ED Triage Vitals  Enc Vitals Group     BP 02/14/20 1649 (!) 145/92     Pulse Rate 02/14/20 1649 85     Resp 02/14/20 1649 20     Temp 02/14/20 1649 98.2 F (36.8 C)     Temp Source 02/14/20 1649 Oral     SpO2 02/14/20 1649 100 %     Weight --      Height --      Head Circumference --      Peak Flow --      Pain Score 02/14/20 1647 10     Pain Loc --      Pain Edu? --      Excl. in [place]? --    No data  found.  Updated Vital Signs BP (!) 145/92 (BP Location: Left Arm)   Pulse 85   Temp 98.2 F (36.8 C) (Oral)   Resp 20   LMP 02/06/2020   SpO2 100%  Visual Acuity Right Eye Distance:   Left Eye Distance:   Bilateral Distance:    Right Eye Near:   Left Eye Near:    Bilateral Near:     Physical Exam Vitals and nursing note reviewed.  Constitutional:      Appearance: Normal appearance.  Musculoskeletal:     Comments: Right arm: Approximately twice as big circumferentially around the upper arm.  There is no erythema. The temperature is normal.  Circulation is 2+ at brachial and radial arteries. There is no tenderness in the cervical spine.  Neurological:     Mental Status: She is alert.      UC Treatments / Results  Labs (all labs ordered are listed, but only abnormal results are displayed) Labs Reviewed - No data to display  EKG   Radiology plain film is basically benign except for some calcific tendinitis. No results found.  Procedures Procedures (including critical care time)  Medications Ordered in UC Medications - No data to display  Initial Impression / Assessment and Plan / UC Course  I have reviewed the triage vital signs and the nursing notes.  Pertinent labs & imaging results that were available during my care of the patient were reviewed by me and considered in my medical decision making (see chart for details).     Swelling of right upper extremity.  Etiology unknown.  Differential would include some sort of lymphatic obstruction or venous obstruction I do not think this is arterial as her hand is warm and pulses are good.  Will provide sling and try to order ultrasound for a.m. Final Clinical Impressions(s) / UC Diagnoses   Final diagnoses:  None   Discharge Instructions   None    ED Prescriptions    None     PDMP not reviewed this encounter.   Wardell Honour, MD 02/14/20 [phone]

## 2020-02-14 NOTE — Discharge Instructions (Signed)
We will schedule ultrasound of right arm for a.m. If symptoms worsen tonight go to ER.

## 2020-02-15 ENCOUNTER — Telehealth (HOSPITAL_COMMUNITY): Payer: Self-pay

## 2020-02-15 NOTE — Telephone Encounter (Signed)
2 times today have called number on record with no answer.  Left name and phone number  Had providers review record.  Was instructed to ask patient to return on 8/13 for evaluation, offer an appointment option.

## 2020-02-16 ENCOUNTER — Encounter (HOSPITAL_COMMUNITY): Payer: Self-pay | Admitting: Family Medicine

## 2020-02-16 ENCOUNTER — Telehealth (HOSPITAL_COMMUNITY): Payer: Self-pay | Admitting: Family Medicine

## 2020-02-16 ENCOUNTER — Ambulatory Visit (HOSPITAL_COMMUNITY)
Admission: RE | Admit: 2020-02-16 | Discharge: 2020-02-16 | Disposition: A | Discharge status: Home / Self Care | Payer: Medicaid Other | Source: Ambulatory Visit | Attending: Family Medicine | Admitting: Family Medicine

## 2020-02-16 ENCOUNTER — Other Ambulatory Visit: Payer: Self-pay

## 2020-02-16 DIAGNOSIS — M79609 Pain in unspecified limb: Secondary | ICD-10-CM

## 2020-02-16 DIAGNOSIS — M7989 Other specified soft tissue disorders: Secondary | ICD-10-CM | POA: Diagnosis not present

## 2020-02-16 MED ORDER — PREDNISONE 10 MG (21) PO TBPK: ORAL_TABLET | ORAL | 0 refills | Status: DC

## 2020-02-16 MED ORDER — HYDROCODONE-ACETAMINOPHEN 5-325 MG PO TABS: 2.0000 | ORAL_TABLET | Freq: Four times a day (QID) | ORAL | 0 refills | Status: DC | PRN

## 2020-02-16 MED ORDER — HYDROCODONE-ACETAMINOPHEN 5-325 MG PO TABS: 1.0000 | ORAL_TABLET | Freq: Four times a day (QID) | ORAL | 0 refills | Status: DC | PRN

## 2020-02-16 NOTE — Telephone Encounter (Signed)
Negative DVT results.  Patient made aware.  Will treat for tendinitis in the shoulder with prednisone taper and refill the hydrocodone to use as needed. Recommend follow-up in person for any continued issues

## 2020-02-16 NOTE — Telephone Encounter (Signed)
Resending medication

## 2020-02-16 NOTE — Progress Notes (Signed)
Right upper extremity venous duplex completed. Refer to "CV Proc" under chart review to view preliminary results.  02/16/2020 10:49 AM Kelby Aline., MHA, RVT, RDCS, RDMS

## 2020-02-16 NOTE — Telephone Encounter (Signed)
Patient called reporting continued arm pain.  Will schedule for ultrasound as planned by previous MD.

## 2020-02-20 ENCOUNTER — Ambulatory Visit
Admission: RE | Admit: 2020-02-20 | Discharge: 2020-02-20 | Disposition: A | Discharge status: Home / Self Care | Payer: Medicaid Other | Source: Ambulatory Visit | Attending: Internal Medicine | Admitting: Internal Medicine

## 2020-02-20 ENCOUNTER — Other Ambulatory Visit: Payer: Self-pay | Admitting: Internal Medicine

## 2020-02-20 ENCOUNTER — Other Ambulatory Visit: Payer: Self-pay

## 2020-02-20 DIAGNOSIS — N6489 Other specified disorders of breast: Secondary | ICD-10-CM | POA: Diagnosis not present

## 2020-02-20 DIAGNOSIS — R599 Enlarged lymph nodes, unspecified: Secondary | ICD-10-CM

## 2020-02-20 DIAGNOSIS — R921 Mammographic calcification found on diagnostic imaging of breast: Secondary | ICD-10-CM

## 2020-02-20 DIAGNOSIS — R928 Other abnormal and inconclusive findings on diagnostic imaging of breast: Secondary | ICD-10-CM

## 2020-03-07 ENCOUNTER — Telehealth (INDEPENDENT_AMBULATORY_CARE_PROVIDER_SITE_OTHER): Payer: Medicaid Other | Admitting: Internal Medicine

## 2020-03-07 ENCOUNTER — Encounter: Payer: Self-pay | Admitting: Internal Medicine

## 2020-03-07 DIAGNOSIS — Z794 Long term (current) use of insulin: Secondary | ICD-10-CM | POA: Diagnosis not present

## 2020-03-07 DIAGNOSIS — E119 Type 2 diabetes mellitus without complications: Secondary | ICD-10-CM | POA: Diagnosis not present

## 2020-03-07 NOTE — Addendum Note (Signed)
Addended by: Carylon Perches on: 03/07/2020 10:30 AM   Modules accepted: Orders

## 2020-03-07 NOTE — Progress Notes (Signed)
Virtual Visit via Telephone Note  I connected with Ruth [name], on 03/07/2020 at 10:31 AM by telephone due to the COVID-19 pandemic and verified that I am speaking with the correct person using two identifiers.   Consent: I discussed the limitations, risks, security and privacy concerns of performing an evaluation and management service by telephone and the availability of in person appointments. I also discussed with the patient that there may be a patient responsible charge related to this service. The patient expressed understanding and agreed to proceed.   Location of Patient: Home   Location of Provider: Clinic    Persons participating in Telemedicine visit: [facility] Dr. Juleen China      History of Present Illness: Patient has a visit for DM f/u. Increased Januvia to 100 mg at last visit due to elevated A1c.   Diabetes mellitus, Type 2 Disease Monitoring             Blood Sugar Ranges:  Random - 150-250. Does not monitor fasting.              Polyuria: no              Visual problems: no   Urine Microalbumin 11 (July 2021)   Last A1C: 10.7   Medications: Januvia 100 mg, Glipizide 10 mg, Levemir 38 u BID Medication Compliance: yes  Medication Side Effects             Hypoglycemia: no       Past Medical History:  Diagnosis Date  . Anemia   . Arthritis    chronic back pain   . Asthma   . Bipolar 1 disorder ([place])   . Fibromyalgia   . Hx of migraines    occ  . Hypertension   . Hypothyroidism   . Obesity   . Sleep apnea   . Type 2 diabetes mellitus (HCC)    Type 2   Allergies  Allergen Reactions  . Metformin And Related     Intolerance   . Penicillins Other (See Comments)    Unknown; from childhood Has patient had a PCN reaction causing immediate rash, facial/tongue/throat swelling, SOB or lightheadedness with hypotension: Unknow Has patient had a PCN reaction causing severe rash involving mucus membranes or  skin necrosis: Unknown Has patient had a PCN reaction that required hospitalization: Unknown Has patient had a PCN reaction occurring within the last 10 years: Unknown If all of the above answers are "NO", then may proceed with Cephalosporin use.   [name] [Liraglutide]     Itching     Current Outpatient Medications on File Prior to Visit  Medication Sig Dispense Refill  . Accu-Chek FastClix Lancets MISC E1 1.65, FOUR BLOOD GLUCOSE TESTING FASTING ANND TWO HOURS AFTER SUPPER    . ACCU-CHEK GUIDE test strip CHECK BLOOD SUGARS TWICE, E11.65    . albuterol (ACCUNEB) 0.63 MG/3ML nebulizer solution USE 1 VIAL VIA NEBULIZER EVERY 4-6 HOURS AS NEEDED 75 mL 3  . albuterol (VENTOLIN HFA) 108 (90 Base) MCG/ACT inhaler Inhale 1-2 puffs into the lungs every 6 (six) hours as needed for wheezing. 18 g 5  . amLODipine (NORVASC) 5 MG tablet Take 1 tablet (5 mg total) by mouth daily. 30 tablet 1  . atorvastatin (LIPITOR) 80 MG tablet Take 1 tablet (80 mg total) by mouth daily at 6 PM. 30 tablet 1  . budesonide-formoterol (SYMBICORT) 160-4.5 MCG/ACT inhaler Inhale 2 puffs into the lungs 2 (two) times daily. 1  Inhaler 5  . cetirizine (ZYRTEC) 10 MG tablet Take 1 tablet (10 mg total) by mouth daily. 30 tablet 11  . gabapentin (NEURONTIN) 300 MG capsule Take 1 capsule (300 mg total) by mouth at bedtime. 30 capsule 2  . glipiZIDE (GLUCOTROL XL) 10 MG 24 hr tablet Take 1 tablet (10 mg total) by mouth daily with breakfast. 30 tablet 1  . insulin detemir (LEVEMIR) 100 UNIT/ML FlexPen Inject 38 Units into the skin 2 (two) times daily. 15 mL 1  . Insulin Pen Needle (PEN NEEDLES) 32G X 4 MM MISC 1 each by Does not apply route 2 (two) times daily. 100 each 11  . omeprazole (PRILOSEC) 20 MG capsule Take 20 mg by mouth 2 (two) times daily before a meal.    . sitaGLIPtin (JANUVIA) 100 MG tablet Take 1 tablet (100 mg total) by mouth daily. 30 tablet 1  . triamterene-hydrochlorothiazide (MAXZIDE-25) 37.5-25 MG tablet Take  1 tablet by mouth daily. 30 tablet 1   No current facility-administered medications on file prior to visit.    Observations/Objective: NAD. Speaking clearly.  Work of breathing normal.  Alert and oriented. Mood appropriate.   Assessment and Plan: 1. Type 2 diabetes mellitus without complication, with long-term current use of insulin (HCC) Last A1c showed poor glycemic control. Patient is not monitoring fasting CBGs. Have asked her to start doing this. Send Mychart message with results in about 1-2 weeks so can adjust Levemir. Continue medication regimen as is for now. Return in about 6 weeks for DM f/u and repeat A1c.  Counseled on Diabetic diet, my plate method, 830 minutes of moderate intensity exercise/week Blood sugar logs with fasting goals of 80-120 mg/dl, random of less than 180 and in the event of sugars less than 60 mg/dl or greater than 400 mg/dl encouraged to notify the clinic. Advised on the need for annual eye exams, annual foot exams, Pneumonia vaccine.   Follow Up Instructions: DM f/u 6 weeks   I discussed the assessment and treatment plan with the patient. The patient was provided an opportunity to ask questions and all were answered. The patient agreed with the plan and demonstrated an understanding of the instructions.   The patient was advised to call back or seek an in-person evaluation if the symptoms worsen or if the condition fails to improve as anticipated.     I provided 22 minutes total of non-face-to-face time during this encounter including median intraservice time, reviewing previous notes, investigations, ordering medications, medical decision making, coordinating care and patient verbalized understanding at the end of the visit.    Phill Myron, D.O. Primary Care at [facility]  03/07/2020, 10:31 AM

## 2020-03-08 ENCOUNTER — Ambulatory Visit (HOSPITAL_COMMUNITY)
Admission: EM | Admit: 2020-03-08 | Discharge: 2020-03-08 | Disposition: A | Discharge status: Home / Self Care | Payer: Medicaid Other | Attending: Internal Medicine | Admitting: Internal Medicine

## 2020-03-08 ENCOUNTER — Encounter (HOSPITAL_COMMUNITY): Payer: Self-pay | Admitting: Emergency Medicine

## 2020-03-08 ENCOUNTER — Other Ambulatory Visit: Payer: Self-pay

## 2020-03-08 DIAGNOSIS — K529 Noninfective gastroenteritis and colitis, unspecified: Secondary | ICD-10-CM | POA: Diagnosis not present

## 2020-03-08 DIAGNOSIS — Z20822 Contact with and (suspected) exposure to covid-19: Secondary | ICD-10-CM | POA: Diagnosis present

## 2020-03-08 LAB — SARS CORONAVIRUS 2 (TAT 6-24 HRS): SARS Coronavirus 2: NEGATIVE

## 2020-03-08 LAB — COMPREHENSIVE METABOLIC PANEL
ALT: UNDETERMINED U/L (ref 0–44)
AST: UNDETERMINED U/L (ref 15–41)
Albumin: 3.6 g/dL (ref 3.5–5.0)
Alkaline Phosphatase: 78 U/L (ref 38–126)
Anion gap: 17 — ABNORMAL HIGH (ref 5–15)
BUN: 6 mg/dL (ref 6–20)
CO2: 16 mmol/L — ABNORMAL LOW (ref 22–32)
Calcium: 8.7 mg/dL — ABNORMAL LOW (ref 8.9–10.3)
Chloride: 102 mmol/L (ref 98–111)
Creatinine, Ser: 0.64 mg/dL (ref 0.44–1.00)
GFR calc Af Amer: >60 mL/min (ref >60)
GFR calc non Af Amer: >60 mL/min (ref >60)
Glucose, Bld: 209 mg/dL — ABNORMAL HIGH (ref 70–99)
Potassium: 3.7 mmol/L (ref 3.5–5.1)
Sodium: 135 mmol/L (ref 135–145)
Total Bilirubin: UNDETERMINED mg/dL (ref 0.3–1.2)
Total Protein: 6.7 g/dL (ref 6.5–8.1)

## 2020-03-08 MED ORDER — ONDANSETRON 4 MG PO TBDP
4.0000 mg | ORAL_TABLET | Freq: Three times a day (TID) | ORAL | 0 refills | Status: DC | PRN
Start: 2020-03-08 — End: 2020-04-24

## 2020-03-08 NOTE — ED Triage Notes (Signed)
Pt presents to [facility] for assessment of diarrhea, fatigue x 2 days.  States she has an intermittent cough with a hx of bronchitis which has flared up.

## 2020-03-08 NOTE — ED Provider Notes (Signed)
[place]    CSN: [id number] Arrival date & time: 03/08/20  4098      History   Chief Complaint Chief Complaint  Patient presents with  . Diarrhea  . Abdominal Pain    HPI Ruth [name] is a 44 y.o. female with hx of T2DM,and HTN.  HPI  Patient reports since Tuesday she has had fatigue. Yesterday, diarrhea and vomiting started.  Vomiting and diarrhea can occur at the same time. Had 4 episodes of vomiting and >10 episodes of diarrhea yesterday. Same symptoms this morning.  Denies abdominal pain, back pain, dysuria, blood in emesis or stool, fever, any recent sick contacts. Works at [employer]. Endorses intermittent dry cough that started getting worse this morning.  Used her albuterol nebulizer which helped. Blood sugar this morning was 214.   Past Medical History:  Diagnosis Date  . Anemia   . Arthritis    chronic back pain   . Asthma   . Bipolar 1 disorder ([place])   . Fibromyalgia   . Hx of migraines    occ  . Hypertension   . Hypothyroidism   . Obesity   . Sleep apnea   . Type 2 diabetes mellitus ([place])    Type 2    Patient Active Problem List   Diagnosis Date Noted  . Right arm pain 02/14/2020  . Thrombocytosis ([place]) 01/26/2020  . Hyperlipidemia associated with type 2 diabetes mellitus ([place]) 09/19/2019  . Abnormal uterine bleeding (AUB) 12/07/2017  . Abnormal mammogram of both breasts 11/02/2016  . Hypothyroidism 06/21/2009  . Type 2 diabetes mellitus without complication, with long-term current use of insulin ([place]) 06/21/2009  . BIPOLAR AFFECTIVE DISORDER 06/21/2009  . 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 INADEQUATE SLEEP HYGIENE 06/21/2009  . OBSTRUCTIVE SLEEP APNEA 06/21/2009  . 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 CARPAL TUNNEL SYNDROME 06/20/2009  . MERALGIA PARESTHETICA 06/20/2009  . Essential hypertension 06/20/2009  . FIBROMYALGIA 06/20/2009  . CEPHALGIA 06/20/2009    Past Surgical History:  Procedure  Laterality Date  . CARPAL TUNNEL RELEASE Right   . CESAREAN SECTION     x2  . DILITATION & CURRETTAGE/HYSTROSCOPY WITH NOVASURE ABLATION N/A 12/07/2017   Procedure: DILATATION & CURETTAGE/HYSTEROSCOPY WITH ATTEMPED NOVASURE ABLATION, FAILED/ENDOMETERIAL ABLATION WITH HTA SYSTEM;  Surgeon: Lavonia Drafts, MD;  Location: [facility];  Service: Gynecology;  Laterality: N/A;  . ELBOW SURGERY     "surgery for nerve pain"  . GANGLION CYST EXCISION Right    right arm   . TONSILLECTOMY     childhood  . TUBAL LIGATION    . VAGINA SURGERY     fistula    OB History    Gravida  7   Para  5   Term  4   Preterm  1   AB  2   Living  5     SAB  2   TAB      Ectopic      Multiple      Live Births  5            Home Medications    Prior to Admission medications   Medication Sig Start Date End Date Taking? Authorizing Provider  Accu-Chek FastClix Lancets MISC E1 1.65, FOUR BLOOD GLUCOSE TESTING FASTING ANND TWO HOURS AFTER SUPPER 07/01/18   [provider]  ACCU-CHEK GUIDE test strip CHECK BLOOD SUGARS TWICE, E11.65 12/21/18   [provider]  albuterol (ACCUNEB) 0.63 MG/3ML nebulizer solution USE 1 VIAL VIA  NEBULIZER EVERY 4-6 HOURS AS NEEDED 01/25/20   Nicolette Bang, DO  albuterol (VENTOLIN HFA) 108 (90 Base) MCG/ACT inhaler Inhale 1-2 puffs into the lungs every 6 (six) hours as needed for wheezing. 01/25/20   Nicolette Bang, DO  amLODipine (NORVASC) 5 MG tablet Take 1 tablet (5 mg total) by mouth daily. 01/25/20   Nicolette Bang, DO  atorvastatin (LIPITOR) 80 MG tablet Take 1 tablet (80 mg total) by mouth daily at 6 PM. 01/26/20   Nicolette Bang, DO  budesonide-formoterol [facility]) 160-4.5 MCG/ACT inhaler Inhale 2 puffs into the lungs 2 (two) times daily. 01/25/20   Nicolette Bang, DO  cetirizine (ZYRTEC) 10 MG tablet Take 1 tablet (10 mg total) by mouth daily. 01/25/20   Nicolette Bang, DO    gabapentin (NEURONTIN) 300 MG capsule Take 1 capsule (300 mg total) by mouth at bedtime. 01/25/20   Nicolette Bang, DO  glipiZIDE (GLUCOTROL XL) 10 MG 24 hr tablet Take 1 tablet (10 mg total) by mouth daily with breakfast. 01/25/20   Nicolette Bang, DO  insulin detemir (LEVEMIR) 100 UNIT/ML FlexPen Inject 38 Units into the skin 2 (two) times daily. 01/25/20   Nicolette Bang, DO  Insulin Pen Needle (PEN NEEDLES) 32G X 4 MM MISC 1 each by Does not apply route 2 (two) times daily. 01/25/20   Nicolette Bang, DO  omeprazole (PRILOSEC) 20 MG capsule Take 20 mg by mouth 2 (two) times daily before a meal.    [provider]  ondansetron (ZOFRAN ODT) 4 MG disintegrating tablet Take 1 tablet (4 mg total) by mouth every 8 (eight) hours as needed for nausea or vomiting. 03/08/20   Flornce Record, Ronnette Juniper, DO  sitaGLIPtin (JANUVIA) 100 MG tablet Take 1 tablet (100 mg total) by mouth daily. 01/25/20   Nicolette Bang, DO  triamterene-hydrochlorothiazide (MAXZIDE-25) 37.5-25 MG tablet Take 1 tablet by mouth daily. 01/25/20   Nicolette Bang, DO    Family History Family History  Problem Relation Age of Onset  . Thyroid disease Mother   . Diabetes Daughter   . Diabetes Son   . Breast cancer Neg Hx     Social History Social History   Tobacco Use  . Smoking status: Current Every Day Smoker    Packs/day: 0.25    Types: Cigarettes  . Smokeless tobacco: Never Used  Vaping Use  . Vaping Use: Never used  Substance Use Topics  . Alcohol use: Yes    Comment: occasionally  . Drug use: Yes    Types: Marijuana     Allergies   Metformin and related, Penicillins, and Victoza [liraglutide]   Review of Systems Review of Systems  Constitutional: Positive for fatigue. Negative for appetite change.  HENT: Negative for congestion, rhinorrhea and sore throat.   Eyes: Negative for discharge.  Respiratory: Positive for cough.   Cardiovascular:  Negative for chest pain.  Gastrointestinal: Positive for diarrhea, nausea and vomiting. Negative for abdominal pain.  Genitourinary: Negative for dysuria.  Musculoskeletal: Negative for arthralgias and myalgias.  Skin: Negative for rash.  Neurological: Positive for headaches. Negative for dizziness and light-headedness.  All other systems reviewed and are negative.    Physical Exam Triage Vital Signs ED Triage Vitals  Enc Vitals Group     BP      Pulse      Resp      Temp      Temp src      SpO2  Weight      Height      Head Circumference      Peak Flow      Pain Score      Pain Loc      Pain Edu?      Excl. in [place]?    No data found.  Updated Vital Signs BP (!) 173/96 (BP Location: Right Wrist)   Pulse 83   Temp 98 F (36.7 C) (Oral)   Resp 18   LMP 03/02/2020   SpO2 100%   Visual Acuity Right Eye Distance:   Left Eye Distance:   Bilateral Distance:    Right Eye Near:   Left Eye Near:    Bilateral Near:     Physical Exam Vitals and nursing note reviewed.  Constitutional:      General: She is not in acute distress.    Appearance: She is well-developed.  HENT:     Head: Normocephalic and atraumatic.  Eyes:     Extraocular Movements: Extraocular movements intact.     Conjunctiva/sclera: Conjunctivae normal.     Pupils: Pupils are equal, round, and reactive to light.  Cardiovascular:     Rate and Rhythm: Normal rate and regular rhythm.     Heart sounds: Normal heart sounds. No murmur heard.   Pulmonary:     Effort: Pulmonary effort is normal. No respiratory distress.     Breath sounds: No wheezing, rhonchi or rales.  Abdominal:     Palpations: Abdomen is soft.     Tenderness: There is no abdominal tenderness.  Musculoskeletal:     Cervical back: Neck supple.  Skin:    General: Skin is warm and dry.     Capillary Refill: Capillary refill takes less than 2 seconds.  Neurological:     Mental Status: She is alert and oriented to person, place,  and time.      UC Treatments / Results  Labs (all labs ordered are listed, but only abnormal results are displayed) Labs Reviewed  COMPREHENSIVE METABOLIC PANEL    EKG   Radiology No results found.  Procedures Procedures (including critical care time)  Medications Ordered in UC Medications - No data to display  Initial Impression / Assessment and Plan / UC Course  I have reviewed the triage vital signs and the nursing notes.  Pertinent labs & imaging results that were available during my care of the patient were reviewed by me and considered in my medical decision making (see chart for details).     BP elevated. Patient did not take her medication this morning. Afebrile.    History consistent with viral illness. Likely gastroenteritis. Overall pt is well appearing, well hydrated, without respiratory distress. Discussed symptomatic treatment. COVID test pending. - Obtain CMP as patient is diabetic with nausea, vomiting, diarrhea  - continue to monitor for fevers  - continue Tylenol/ Motrin as needed for discomfort - nasal saline to help with his nasal congestion - Use a cool mist humidifier at bedtime to help with breathing - Stressed oral  hydration - Continue albuterol nebulizer as needed  - Zofran for nausea  - Work note provided  - Discussed ED precautions, understanding voiced  Final Clinical Impressions(s) / UC Diagnoses   Final diagnoses:  Gastroenteritis     Discharge Instructions     You were seen at the Urgent Care for vomiting and diarrhea. Please pick up your prescriptions at your pharmacy. Be sure to stay hydrated. Follow up with your PCP as needed.  If you haven't already, sign up for My Chart to have easy access to your labs results, and communication with your primary care physician.  Dr. Susa Simmonds      ED Prescriptions    Medication Sig Dispense Auth. Provider   ondansetron (ZOFRAN ODT) 4 MG disintegrating tablet Take 1 tablet (4 mg  total) by mouth every 8 (eight) hours as needed for nausea or vomiting. 20 tablet Lyndee Hensen, DO     PDMP not reviewed this encounter.   Lyndee Hensen, DO 03/08/20 1017

## 2020-03-08 NOTE — Discharge Instructions (Addendum)
You were seen at the Urgent Care for vomiting and diarrhea. Please pick up your prescriptions at your pharmacy. Be sure to stay hydrated. Follow up with your PCP as needed.   If you haven't already, sign up for My Chart to have easy access to your labs results, and communication with your primary care physician.  Dr. Susa Simmonds

## 2020-03-23 ENCOUNTER — Ambulatory Visit (HOSPITAL_COMMUNITY)
Admission: EM | Admit: 2020-03-23 | Discharge: 2020-03-23 | Disposition: A | Discharge status: Home / Self Care | Payer: Medicaid Other | Attending: Emergency Medicine | Admitting: Emergency Medicine

## 2020-03-23 ENCOUNTER — Encounter (HOSPITAL_COMMUNITY): Payer: Self-pay

## 2020-03-23 ENCOUNTER — Other Ambulatory Visit: Payer: Self-pay

## 2020-03-23 DIAGNOSIS — M79604 Pain in right leg: Secondary | ICD-10-CM | POA: Diagnosis not present

## 2020-03-23 DIAGNOSIS — M5441 Lumbago with sciatica, right side: Secondary | ICD-10-CM | POA: Diagnosis not present

## 2020-03-23 MED ORDER — TIZANIDINE HCL 4 MG PO TABS
4.0000 mg | ORAL_TABLET | Freq: Four times a day (QID) | ORAL | 0 refills | Status: DC | PRN
Start: 2020-03-23 — End: 2020-04-24

## 2020-03-23 MED ORDER — PREDNISONE 10 MG PO TABS
ORAL_TABLET | ORAL | 0 refills | Status: DC
Start: 2020-03-23 — End: 2020-04-24

## 2020-03-23 MED ORDER — KETOROLAC TROMETHAMINE 30 MG/ML IJ SOLN
30.0000 mg | Freq: Once | INTRAMUSCULAR | Status: AC
  Administered 2020-03-23: 30 mg via INTRAMUSCULAR

## 2020-03-23 MED ORDER — KETOROLAC TROMETHAMINE 30 MG/ML IJ SOLN
INTRAMUSCULAR | Status: AC
  Filled 2020-03-23: qty 1

## 2020-03-23 NOTE — Discharge Instructions (Signed)
We gave you an injection of Toradol Begin prednisone taper over the next 6 days-begin with 6 tablets, decrease by 1 tablet each day until complete-6, 5, 4, 3, 2, 1-take with food in the morning.  Monitor sugars while on this. Tizanidine/muscle relaxer as needed at home at bedtime Gentle stretching and movement Follow-up if not improving or worsening

## 2020-03-23 NOTE — ED Provider Notes (Signed)
[place]    CSN: [id number] Arrival date & time: 03/23/20  1109      History   Chief Complaint Chief Complaint  Patient presents with  . Leg Pain    HPI Princesa Willig is a 44 y.o. female presenting today for evaluation of right leg pain.  Patient reports yesterday she began to develop pain in her right upper gluteal area into her lateral leg and foot.  Has some paresthesias associated with this.  Reports began after she was doing a lot of walking.  Hurts with standing.  She denies injury or fall.  Denies any urinary symptoms.  Taking ibuprofen without relief.  Reports recent sugars around 200.   HPI  Past Medical History:  Diagnosis Date  . Anemia   . Arthritis    chronic back pain   . Asthma   . Bipolar 1 disorder ([place])   . Fibromyalgia   . Hx of migraines    occ  . Hypertension   . Hypothyroidism   . Obesity   . Sleep apnea   . Type 2 diabetes mellitus ([place])    Type 2    Patient Active Problem List   Diagnosis Date Noted  . Right arm pain 02/14/2020  . Thrombocytosis ([place]) 01/26/2020  . Hyperlipidemia associated with type 2 diabetes mellitus ([place]) 09/19/2019  . Abnormal uterine bleeding (AUB) 12/07/2017  . Abnormal mammogram of both breasts 11/02/2016  . Hypothyroidism 06/21/2009  . Type 2 diabetes mellitus without complication, with long-term current use of insulin ([place]) 06/21/2009  . BIPOLAR AFFECTIVE DISORDER 06/21/2009  . 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 INADEQUATE SLEEP HYGIENE 06/21/2009  . OBSTRUCTIVE SLEEP APNEA 06/21/2009  . 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 CARPAL TUNNEL SYNDROME 06/20/2009  . MERALGIA PARESTHETICA 06/20/2009  . Essential hypertension 06/20/2009  . FIBROMYALGIA 06/20/2009  . CEPHALGIA 06/20/2009    Past Surgical History:  Procedure Laterality Date  . CARPAL TUNNEL RELEASE Right   . CESAREAN SECTION     x2  . DILITATION & CURRETTAGE/HYSTROSCOPY WITH NOVASURE ABLATION N/A  12/07/2017   Procedure: DILATATION & CURETTAGE/HYSTEROSCOPY WITH ATTEMPED NOVASURE ABLATION, FAILED/ENDOMETERIAL ABLATION WITH HTA SYSTEM;  Surgeon: Lavonia Drafts, MD;  Location: [facility];  Service: Gynecology;  Laterality: N/A;  . ELBOW SURGERY     "surgery for nerve pain"  . GANGLION CYST EXCISION Right    right arm   . TONSILLECTOMY     childhood  . TUBAL LIGATION    . VAGINA SURGERY     fistula    OB History    Gravida  7   Para  5   Term  4   Preterm  1   AB  2   Living  5     SAB  2   TAB      Ectopic      Multiple      Live Births  5            Home Medications    Prior to Admission medications   Medication Sig Start Date End Date Taking? Authorizing Provider  Accu-Chek FastClix Lancets MISC E1 1.65, FOUR BLOOD GLUCOSE TESTING FASTING ANND TWO HOURS AFTER SUPPER 07/01/18   [provider]  ACCU-CHEK GUIDE test strip CHECK BLOOD SUGARS TWICE, E11.65 12/21/18   [provider]  albuterol (ACCUNEB) 0.63 MG/3ML nebulizer solution USE 1 VIAL VIA NEBULIZER EVERY 4-6 HOURS AS NEEDED 01/25/20   Nicolette Bang, DO  albuterol (VENTOLIN HFA) 108 (  90 Base) MCG/ACT inhaler Inhale 1-2 puffs into the lungs every 6 (six) hours as needed for wheezing. 01/25/20   Nicolette Bang, DO  amLODipine (NORVASC) 5 MG tablet Take 1 tablet (5 mg total) by mouth daily. 01/25/20   Nicolette Bang, DO  atorvastatin (LIPITOR) 80 MG tablet Take 1 tablet (80 mg total) by mouth daily at 6 PM. 01/26/20   Nicolette Bang, DO  budesonide-formoterol [facility]) 160-4.5 MCG/ACT inhaler Inhale 2 puffs into the lungs 2 (two) times daily. 01/25/20   Nicolette Bang, DO  cetirizine (ZYRTEC) 10 MG tablet Take 1 tablet (10 mg total) by mouth daily. 01/25/20   Nicolette Bang, DO  gabapentin (NEURONTIN) 300 MG capsule Take 1 capsule (300 mg total) by mouth at bedtime. 01/25/20   Nicolette Bang, DO  glipiZIDE  (GLUCOTROL XL) 10 MG 24 hr tablet Take 1 tablet (10 mg total) by mouth daily with breakfast. 01/25/20   Nicolette Bang, DO  insulin detemir (LEVEMIR) 100 UNIT/ML FlexPen Inject 38 Units into the skin 2 (two) times daily. 01/25/20   Nicolette Bang, DO  Insulin Pen Needle (PEN NEEDLES) 32G X 4 MM MISC 1 each by Does not apply route 2 (two) times daily. 01/25/20   Nicolette Bang, DO  omeprazole (PRILOSEC) 20 MG capsule Take 20 mg by mouth 2 (two) times daily before a meal.    [provider]  ondansetron (ZOFRAN ODT) 4 MG disintegrating tablet Take 1 tablet (4 mg total) by mouth every 8 (eight) hours as needed for nausea or vomiting. 03/08/20   Brimage, Ronnette Juniper, DO  predniSONE (DELTASONE) 10 MG tablet Begin with 6 tabs on day 1, 5 tab on day 2, 4 tab on day 3, 3 tab on day 4, 2 tab on day 5, 1 tab on day 6-take with food 03/23/20   Jahkari Maclin C, PA-C  sitaGLIPtin (JANUVIA) 100 MG tablet Take 1 tablet (100 mg total) by mouth daily. 01/25/20   Nicolette Bang, DO  tiZANidine (ZANAFLEX) 4 MG tablet Take 1 tablet (4 mg total) by mouth every 6 (six) hours as needed for muscle spasms. 03/23/20   Geralene Afshar C, PA-C  triamterene-hydrochlorothiazide (MAXZIDE-25) 37.5-25 MG tablet Take 1 tablet by mouth daily. 01/25/20   Nicolette Bang, DO    Family History Family History  Problem Relation Age of Onset  . Thyroid disease Mother   . Diabetes Daughter   . Diabetes Son   . Breast cancer Neg Hx     Social History Social History   Tobacco Use  . Smoking status: Current Every Day Smoker    Packs/day: 0.25    Types: Cigarettes  . Smokeless tobacco: Never Used  Vaping Use  . Vaping Use: Never used  Substance Use Topics  . Alcohol use: Yes    Comment: occasionally  . Drug use: Yes    Types: Marijuana     Allergies   Metformin and related, Penicillins, and Victoza [liraglutide]   Review of Systems Review of Systems  Constitutional:  Negative for fatigue and fever.  HENT: Negative for mouth sores.   Eyes: Negative for visual disturbance.  Respiratory: Negative for shortness of breath.   Cardiovascular: Negative for chest pain.  Gastrointestinal: Negative for abdominal pain, nausea and vomiting.  Genitourinary: Negative for difficulty urinating, dysuria, frequency, genital sores and urgency.  Musculoskeletal: Positive for arthralgias, back pain and myalgias. Negative for joint swelling.  Skin: Negative for color change, rash and wound.  Neurological: Negative for dizziness, weakness, light-headedness and headaches.     Physical Exam Triage Vital Signs ED Triage Vitals  Enc Vitals Group     BP 03/23/20 1238 (!) 149/95     Pulse Rate 03/23/20 1238 82     Resp 03/23/20 1238 18     Temp 03/23/20 1238 98 F (36.7 C)     Temp Source 03/23/20 1238 Oral     SpO2 03/23/20 1238 97 %     Weight --      Height --      Head Circumference --      Peak Flow --      Pain Score 03/23/20 1237 6     Pain Loc --      Pain Edu? --      Excl. in [place]? --    No data found.  Updated Vital Signs BP (!) 149/95 (BP Location: Right Arm)   Pulse 82   Temp 98 F (36.7 C) (Oral)   Resp 18   LMP 03/02/2020   SpO2 97%   Visual Acuity Right Eye Distance:   Left Eye Distance:   Bilateral Distance:    Right Eye Near:   Left Eye Near:    Bilateral Near:     Physical Exam Vitals and nursing note reviewed.  Constitutional:      Appearance: She is well-developed.     Comments: No acute distress  HENT:     Head: Normocephalic and atraumatic.     Nose: Nose normal.  Eyes:     Conjunctiva/sclera: Conjunctivae normal.  Cardiovascular:     Rate and Rhythm: Normal rate.  Pulmonary:     Effort: Pulmonary effort is normal. No respiratory distress.  Abdominal:     General: There is no distension.  Musculoskeletal:        General: Normal range of motion.     Cervical back: Neck supple.     Comments: Nontender to palpation  along cervical, thoracic and lumbar spine midline, increased tenderness throughout right lumbar and upper gluteal area extending into the lateral leg   Skin:    General: Skin is warm and dry.  Neurological:     Mental Status: She is alert and oriented to person, place, and time.      UC Treatments / Results  Labs (all labs ordered are listed, but only abnormal results are displayed) Labs Reviewed - No data to display  EKG   Radiology No results found.  Procedures Procedures (including critical care time)  Medications Ordered in UC Medications  ketorolac (TORADOL) 30 MG/ML injection 30 mg (30 mg Intramuscular Given 03/23/20 1359)    Initial Impression / Assessment and Plan / UC Course  I have reviewed the triage vital signs and the nursing notes.  Pertinent labs & imaging results that were available during my care of the patient were reviewed by me and considered in my medical decision making (see chart for details).     1.Back\Right leg pain-suspect likely sciatica, no red flags for cauda equina, no mechanism of injury, discussed risk/benefit of steroids in setting of diabetes, opted to proceed with prednisone taper, Toradol prior to discharge as well as muscle relaxers.  Discussed activity modification, gentle stretching.  Discussed strict return precautions. Patient verbalized understanding and is agreeable with plan.  Final Clinical Impressions(s) / UC Diagnoses   Final diagnoses:  Right leg pain  Acute right-sided low back pain with right-sided sciatica     Discharge Instructions  We gave you an injection of Toradol Begin prednisone taper over the next 6 days-begin with 6 tablets, decrease by 1 tablet each day until complete-6, 5, 4, 3, 2, 1-take with food in the morning.  Monitor sugars while on this. Tizanidine/muscle relaxer as needed at home at bedtime Gentle stretching and movement Follow-up if not improving or worsening    ED Prescriptions     Medication Sig Dispense Auth. Provider   predniSONE (DELTASONE) 10 MG tablet Begin with 6 tabs on day 1, 5 tab on day 2, 4 tab on day 3, 3 tab on day 4, 2 tab on day 5, 1 tab on day 6-take with food 21 tablet Gelene Recktenwald C, PA-C   tiZANidine (ZANAFLEX) 4 MG tablet Take 1 tablet (4 mg total) by mouth every 6 (six) hours as needed for muscle spasms. 30 tablet Vona Whiters, [place] C, PA-C     PDMP not reviewed this encounter.   Janith Lima, [place] 03/25/20 1849

## 2020-03-23 NOTE — ED Triage Notes (Signed)
Pt present right leg pain, symptoms started yesterday. Pt states that she was doing a lot of walking with the band yesterday. States when she stand on her foot it hurts.

## 2020-03-29 ENCOUNTER — Other Ambulatory Visit: Payer: Self-pay

## 2020-03-29 ENCOUNTER — Ambulatory Visit (HOSPITAL_COMMUNITY)
Admission: EM | Admit: 2020-03-29 | Discharge: 2020-03-29 | Disposition: A | Discharge status: Home / Self Care | Payer: Medicaid Other

## 2020-03-29 ENCOUNTER — Other Ambulatory Visit: Payer: Medicaid Other

## 2020-03-29 DIAGNOSIS — Z20822 Contact with and (suspected) exposure to covid-19: Secondary | ICD-10-CM | POA: Diagnosis not present

## 2020-03-31 LAB — NOVEL CORONAVIRUS, NAA: SARS-CoV-2, NAA: NOT DETECTED

## 2020-03-31 LAB — SARS-COV-2, NAA 2 DAY TAT

## 2020-04-12 ENCOUNTER — Other Ambulatory Visit: Payer: Self-pay

## 2020-04-12 ENCOUNTER — Other Ambulatory Visit: Payer: Medicaid Other

## 2020-04-12 DIAGNOSIS — Z20822 Contact with and (suspected) exposure to covid-19: Secondary | ICD-10-CM

## 2020-04-13 LAB — NOVEL CORONAVIRUS, NAA: SARS-CoV-2, NAA: NOT DETECTED

## 2020-04-13 LAB — SARS-COV-2, NAA 2 DAY TAT

## 2020-04-22 DIAGNOSIS — Z20828 Contact with and (suspected) exposure to other viral communicable diseases: Secondary | ICD-10-CM | POA: Diagnosis not present

## 2020-04-25 ENCOUNTER — Encounter: Payer: Self-pay | Admitting: Internal Medicine

## 2020-04-25 ENCOUNTER — Telehealth (INDEPENDENT_AMBULATORY_CARE_PROVIDER_SITE_OTHER): Payer: Medicaid Other | Admitting: Internal Medicine

## 2020-04-25 DIAGNOSIS — E785 Hyperlipidemia, unspecified: Secondary | ICD-10-CM | POA: Diagnosis not present

## 2020-04-25 DIAGNOSIS — E1169 Type 2 diabetes mellitus with other specified complication: Secondary | ICD-10-CM | POA: Diagnosis not present

## 2020-04-25 DIAGNOSIS — I10 Essential (primary) hypertension: Secondary | ICD-10-CM | POA: Diagnosis not present

## 2020-04-25 DIAGNOSIS — Z794 Long term (current) use of insulin: Secondary | ICD-10-CM | POA: Diagnosis not present

## 2020-04-25 DIAGNOSIS — E1142 Type 2 diabetes mellitus with diabetic polyneuropathy: Secondary | ICD-10-CM

## 2020-04-25 DIAGNOSIS — E119 Type 2 diabetes mellitus without complications: Secondary | ICD-10-CM

## 2020-04-25 MED ORDER — GABAPENTIN 300 MG PO CAPS: 300.0000 mg | ORAL_CAPSULE | Freq: Every day | ORAL | 5 refills | Status: DC

## 2020-04-25 MED ORDER — INSULIN DETEMIR 100 UNIT/ML FLEXPEN: 38.0000 [IU] | PEN_INJECTOR | Freq: Two times a day (BID) | SUBCUTANEOUS | 1 refills | Status: DC

## 2020-04-25 MED ORDER — AMLODIPINE BESYLATE 5 MG PO TABS: 5.0000 mg | ORAL_TABLET | Freq: Every day | ORAL | 5 refills | Status: DC

## 2020-04-25 MED ORDER — ATORVASTATIN CALCIUM 80 MG PO TABS: 80.0000 mg | ORAL_TABLET | Freq: Every day | ORAL | 5 refills | Status: DC

## 2020-04-25 MED ORDER — TRIAMTERENE-HCTZ 37.5-25 MG PO TABS: 1.0000 | ORAL_TABLET | Freq: Every day | ORAL | 5 refills | Status: DC

## 2020-04-25 MED ORDER — SITAGLIPTIN PHOSPHATE 100 MG PO TABS: 100.0000 mg | ORAL_TABLET | Freq: Every day | ORAL | 5 refills | Status: DC

## 2020-04-25 MED ORDER — GLIPIZIDE ER 10 MG PO TB24: 10.0000 mg | ORAL_TABLET | Freq: Every day | ORAL | 5 refills | Status: DC

## 2020-04-25 NOTE — Progress Notes (Signed)
Virtual Visit via Telephone Note  I connected with Ruth [name], on 04/25/2020 at 11:01 AM by telephone due to the COVID-19 pandemic and verified that I am speaking with the correct person using two identifiers.   Consent: I discussed the limitations, risks, security and privacy concerns of performing an evaluation and management service by telephone and the availability of in person appointments. I also discussed with the patient that there may be a patient responsible charge related to this service. The patient expressed understanding and agreed to proceed.   Location of Patient: Home   Location of Provider: Clinic    Persons participating in Telemedicine visit: Ruth [name] [facility] Dr. Juleen China      History of Present Illness: Patient has a visit to f/u on DM.   Diabetes mellitus, Type 2 Disease Monitoring             Blood Sugar Ranges: Fasting -198-220              Polyuria: no              Visual problems: no   Urine Microalbumin 11 (July 2021)   Last A1C: 10.7 (July 2021)   Medications: Glipizide 10 XL daily, Januvia 100 mg ---Levemir reports she takes 80u BID  Medication Compliance: yes  Medication Side Effects             Hypoglycemia: no      Past Medical History:  Diagnosis Date  . Anemia   . Arthritis    chronic back pain   . Asthma   . Bipolar 1 disorder ([place])   . Fibromyalgia   . Hx of migraines    occ  . Hypertension   . Hypothyroidism   . Obesity   . Sleep apnea   . Type 2 diabetes mellitus (HCC)    Type 2   Allergies  Allergen Reactions  . Metformin And Related     Intolerance   . Penicillins Other (See Comments)    Unknown; from childhood Has patient had a PCN reaction causing immediate rash, facial/tongue/throat swelling, SOB or lightheadedness with hypotension: Unknow Has patient had a PCN reaction causing severe rash involving mucus membranes or skin necrosis: Unknown Has patient had a PCN  reaction that required hospitalization: Unknown Has patient had a PCN reaction occurring within the last 10 years: Unknown If all of the above answers are "NO", then may proceed with Cephalosporin use.   [name] [Liraglutide]     Itching     Current Outpatient Medications on File Prior to Visit  Medication Sig Dispense Refill  . Accu-Chek FastClix Lancets MISC E1 1.65, FOUR BLOOD GLUCOSE TESTING FASTING ANND TWO HOURS AFTER SUPPER    . ACCU-CHEK GUIDE test strip CHECK BLOOD SUGARS TWICE, E11.65    . albuterol (ACCUNEB) 0.63 MG/3ML nebulizer solution USE 1 VIAL VIA NEBULIZER EVERY 4-6 HOURS AS NEEDED 75 mL 3  . albuterol (VENTOLIN HFA) 108 (90 Base) MCG/ACT inhaler Inhale 1-2 puffs into the lungs every 6 (six) hours as needed for wheezing. 18 g 5  . amLODipine (NORVASC) 5 MG tablet Take 1 tablet (5 mg total) by mouth daily. 30 tablet 1  . atorvastatin (LIPITOR) 80 MG tablet Take 1 tablet (80 mg total) by mouth daily at 6 PM. 30 tablet 1  . budesonide-formoterol (SYMBICORT) 160-4.5 MCG/ACT inhaler Inhale 2 puffs into the lungs 2 (two) times daily. 1 Inhaler 5  . cetirizine (ZYRTEC) 10 MG tablet Take 1 tablet (  10 mg total) by mouth daily. 30 tablet 11  . gabapentin (NEURONTIN) 300 MG capsule Take 1 capsule (300 mg total) by mouth at bedtime. 30 capsule 2  . glipiZIDE (GLUCOTROL XL) 10 MG 24 hr tablet Take 1 tablet (10 mg total) by mouth daily with breakfast. 30 tablet 1  . insulin detemir (LEVEMIR) 100 UNIT/ML FlexPen Inject 38 Units into the skin 2 (two) times daily. 15 mL 1  . Insulin Pen Needle (PEN NEEDLES) 32G X 4 MM MISC 1 each by Does not apply route 2 (two) times daily. 100 each 11  . sitaGLIPtin (JANUVIA) 100 MG tablet Take 1 tablet (100 mg total) by mouth daily. 30 tablet 1  . triamterene-hydrochlorothiazide (MAXZIDE-25) 37.5-25 MG tablet Take 1 tablet by mouth daily. 30 tablet 1   No current facility-administered medications on file prior to visit.     Observations/Objective: NAD. Speaking clearly.  Work of breathing normal.  Alert and oriented. Mood appropriate.   Assessment and Plan: 1. Type 2 diabetes mellitus with diabetic polyneuropathy, with long-term current use of insulin ([place]) Patient with poor fasting CBG control; although not checking consistently. I am very confused as to how she started dosing Levemir at 80 units BID. Although she does not seem confident that is the dose she is actually taking. I have asked her to keep a log of fasting CBGs and the amount of Levemir given and at what time. Continue Glipizide and Januvia. Follow up with Benard Halsted, PharmD within 2 weeks with log to further assess.  - glipiZIDE (GLUCOTROL XL) 10 MG 24 hr tablet; Take 1 tablet (10 mg total) by mouth daily with breakfast.  Dispense: 30 tablet; Refill: 5 - insulin detemir (LEVEMIR) 100 UNIT/ML FlexPen; Inject 38 Units into the skin 2 (two) times daily.  Dispense: 15 mL; Refill: 1 - sitaGLIPtin (JANUVIA) 100 MG tablet; Take 1 tablet (100 mg total) by mouth daily.  Dispense: 30 tablet; Refill: 5  2. Essential hypertension Not monitoring BPs. Asymptomatic. Continue current regimen.  - amLODipine (NORVASC) 5 MG tablet; Take 1 tablet (5 mg total) by mouth daily.  Dispense: 30 tablet; Refill: 5 - triamterene-hydrochlorothiazide (MAXZIDE-25) 37.5-25 MG tablet; Take 1 tablet by mouth daily.  Dispense: 30 tablet; Refill: 5  3. Hyperlipidemia associated with type 2 diabetes mellitus (HCC) - atorvastatin (LIPITOR) 80 MG tablet; Take 1 tablet (80 mg total) by mouth daily at 6 PM.  Dispense: 30 tablet; Refill: 5  4. Diabetic polyneuropathy associated with type 2 diabetes mellitus (HCC) - gabapentin (NEURONTIN) 300 MG capsule; Take 1 capsule (300 mg total) by mouth at bedtime.  Dispense: 30 capsule; Refill: 5   Follow Up Instructions: Visit with Benard Halsted, PharmD   I discussed the assessment and treatment plan with the patient. The patient  was provided an opportunity to ask questions and all were answered. The patient agreed with the plan and demonstrated an understanding of the instructions.   The patient was advised to call back or seek an in-person evaluation if the symptoms worsen or if the condition fails to improve as anticipated.     I provided 24 minutes total of non-face-to-face time during this encounter including median intraservice time, reviewing previous notes, investigations, ordering medications, medical decision making, coordinating care and patient verbalized understanding at the end of the visit.    Phill Myron, D.O. Primary Care at [facility]  04/25/2020, 11:01 AM

## 2020-04-29 DIAGNOSIS — Z20828 Contact with and (suspected) exposure to other viral communicable diseases: Secondary | ICD-10-CM | POA: Diagnosis not present

## 2020-05-03 ENCOUNTER — Other Ambulatory Visit: Payer: Self-pay

## 2020-05-03 ENCOUNTER — Ambulatory Visit: Payer: Medicaid Other | Attending: Internal Medicine | Admitting: Pharmacist

## 2020-05-03 ENCOUNTER — Encounter: Payer: Self-pay | Admitting: Pharmacist

## 2020-05-03 DIAGNOSIS — E1142 Type 2 diabetes mellitus with diabetic polyneuropathy: Secondary | ICD-10-CM | POA: Diagnosis not present

## 2020-05-03 LAB — POCT GLYCOSYLATED HEMOGLOBIN (HGB A1C): Hemoglobin A1C: 8.8 % — AB (ref 4.0–5.6)

## 2020-05-03 NOTE — Progress Notes (Signed)
PCP: Dr. Juleen China  S:    Patient arrives in good spirits. Presents for diabetes evaluation, education, and management. Patient was referred and last seen by Primary Care Provider on 04/25/20 via telehealth. At that visit, there was confusion on diabetes medications being taken and patient admitted to not checking CBG consistently. Patient was advised to keep a log of fasting CBGs and refills for medications were sent in. Today, patient presents stating she is out of all medications and hasn't taken any of her diabetes medications for at least one week. Of note, she is not taking 80 units BID of Levemir as reported at her last PCP visit. She is taking 38 units BID (76 TDD).   Family History: thyroid disease, DM Social History: current every day smoker (0.25 PPD)  Insurance coverage/medication affordability: [place] Medicaid  Medication adherence denied. Hasn't taken medications in at least a week.   Current diabetes medications include: glipizide 10 mg daily, januvia 100 mg daily, levemir 38 units BID Current hypertension medications include: amlodipine 5 mg tablet daily, maxzide 37.5-25 mg daily Current hyperlipidemia medications include: atorvastatin 80 mg daily  Patient denies hypoglycemic events.  Patient reported dietary habits: Eats 1-2 meals/day Lunch: taco bell Dinner: varies depending on if she is hungry Snacks:chips, cookies, gummies Drinks: water, regular pepsi at lunch  Patient-reported exercise habits: walks occasionally   Patient denies nocturia (nighttime urination).  Patient denies neuropathy (nerve pain). Patient denies visual changes. Patient denies self foot exams.     O:   Prior HGB A1c: 10.7 (01/25/2020) Lab Results  Component Value Date   HGBA1C 8.8 (A) 05/03/2020   There were no vitals filed for this visit.  Lipid Panel     Component Value Date/Time   CHOL 277 (H) 01/25/2020 1435   TRIG 139 01/25/2020 1435   HDL 47 01/25/2020 1435   CHOLHDL 5.9 (H)  01/25/2020 1435   LDLCALC 204 (H) 01/25/2020 1435    Doesn't check sugars consistently, only when she "feels it is too high". Last checked on Monday and was 199.  Clinical Atherosclerotic Cardiovascular Disease (ASCVD): No  The 10-year ASCVD risk score Mikey Bussing DC Jr., et al., 2013) is: 35.9%   Values used to calculate the score:     Age: 89 years     Sex: Female     Is Non-Hispanic African American: Yes     Diabetic: Yes     Tobacco smoker: Yes     Systolic Blood Pressure: 277 mmHg     Is BP treated: Yes     HDL Cholesterol: 47 mg/dL     Total Cholesterol: 277 mg/dL    A/P: Diabetes longstanding currently uncontrolled. Patient is able to verbalize appropriate hypoglycemia management plan. Medication adherence denied. Patient states she has been out of all medications and hasn't taken them in a week. Pharmacy fill records support long-standing non-compliance with levemir. A1c today is decreased to 8.8 but is still above goal of <7. Encouraged patient that with appropriate medication adherence, goal A1c is achievable. Requested patient to be adherent to all medications and glucose testing and to follow-up via phone call in one week. Long-term, patient may benefit from [place] a GLP-1. Control is suboptimal due to medication non-compliance. -Continued Levemir (insulin detemir) 38 units twice daily. -Continued Januvia 100 mg daily. -Continued glipizide 10 mg daily. --Advised patient that refills on medications are ready at [facility].  -Advised patient to be adherent to medications and to check FBG daily until next visit.  -Extensively discussed  pathophysiology of diabetes, recommended lifestyle interventions, dietary effects on blood sugar control -Counseled on s/sx of and management of hypoglycemia -Next A1C anticipated 07/2020.   ASCVD risk - primary prevention in patient with diabetes. Last LDL is not controlled. ASCVD risk score is >20%  - high intensity statin indicated.  -Continued  Atorvastatin 80 mg daily.   Written patient instructions provided.  Total time in face to face counseling 15 minutes.  Follow up Pharmacist Clinic Visit in one week.  Patient seen with Jacobo Forest PharmD Candidate, Class of 2022 [place] [employer]

## 2020-05-06 NOTE — Progress Notes (Signed)
Patient notified of results & recommendations. Expressed understanding.

## 2020-05-07 DIAGNOSIS — Z20828 Contact with and (suspected) exposure to other viral communicable diseases: Secondary | ICD-10-CM | POA: Diagnosis not present

## 2020-05-13 ENCOUNTER — Other Ambulatory Visit: Payer: Self-pay

## 2020-05-13 ENCOUNTER — Ambulatory Visit: Payer: Medicaid Other | Attending: Internal Medicine | Admitting: Pharmacist

## 2020-05-13 DIAGNOSIS — E1142 Type 2 diabetes mellitus with diabetic polyneuropathy: Secondary | ICD-10-CM

## 2020-05-13 NOTE — Progress Notes (Signed)
PCP: Dr. Juleen China  Virtual Visit via Telephone Note Due to national recommendations of social distancing due to [place] 19, telehealth visit is felt to be most appropriate for this patient at this time. I discussed the limitations, risks, security and privacy concerns of performing an evaluation and management service by telephone and the availability of in person appointments. I also discussed with the patient that there may be a patient responsible charge related to this service. The patient expressed understanding and agreed to proceed.   I connected with Ruth [name] on11/02/2020 @ 11:00 AMEDT by telephoneand verified that I am speaking with the correct person using two identifiers.  Consent I discussed the limitations, risks, security and privacy concerns of performing an evaluation and management service by telephone and the availability of in person appointments. I also discussed with the patient that there may be a patient responsible charge related to this service. The patient expressed understanding and agreed to proceed.  Location of Patient: PrivateResidence  Location of Provider: [place] [facility] [employer] Office   Persons participating in Telemedicine visit: Myself Patient      S:    Patient arrives in good spirits. Presents for diabetes evaluation, education, and management. Patient was referred and last seen by Primary Care Provider on 04/25/20. I saw her on 05/03/2020 and provided refills. No changes were made to her medications.  Family History: thyroid disease, DM Social History: current every day smoker (0.25 PPD)  Insurance coverage/medication affordability:  Medicaid  Medication adherence reported. Current diabetes medications include: glipizide 10 mg daily, januvia 100 mg daily, levemir 38 units BID Current hypertension medications include: amlodipine 5 mg tablet daily, maxzide 37.5-25 mg daily Current hyperlipidemia medications  include: atorvastatin 80 mg daily  Patient denies hypoglycemic events.  Patient reported dietary habits: Eats 1-2 meals/day Lunch: taco bell Dinner: varies depending on if she is hungry Snacks:chips, cookies, gummies Drinks: water, regular pepsi at lunch  Patient-reported exercise habits: walks occasionally   Patient denies nocturia (nighttime urination).  Patient denies neuropathy (nerve pain). Patient denies visual changes. Patient denies self foot exams.     O:   Prior HGB A1c: 10.7 (01/25/2020) Lab Results  Component Value Date   HGBA1C 8.8 (A) 05/03/2020   There were no vitals filed for this visit.  Lipid Panel     Component Value Date/Time   CHOL 277 (H) 01/25/2020 1435   TRIG 139 01/25/2020 1435   HDL 47 01/25/2020 1435   CHOLHDL 5.9 (H) 01/25/2020 1435   LDLCALC 204 (H) 01/25/2020 1435   Home CBGs: Fasting CBGs: 94 - 199. Reports 90s-140s mostly 2-hour post-prandial: No readings given. Denies any >200.   Clinical Atherosclerotic Cardiovascular Disease (ASCVD): No  The 10-year ASCVD risk score Mikey Bussing DC Jr., et al., 2013) is: 35.9%   Values used to calculate the score:     Age: 44 years     Sex: Female     Is Non-Hispanic African American: Yes     Diabetic: Yes     Tobacco smoker: Yes     Systolic Blood Pressure: 361 mmHg     Is BP treated: Yes     HDL Cholesterol: 47 mg/dL     Total Cholesterol: 277 mg/dL    A/P: Diabetes longstanding currently uncontrolled, however, home sugars reveal improvement. Patient is able to verbalize appropriate hypoglycemia management plan. Medication adherence reported. Control is suboptimal due to medication non-compliance. -Continued Levemir (insulin detemir) 38 units twice daily. -Continued Januvia 100 mg daily. -Continued  glipizide 10 mg daily. -Extensively discussed pathophysiology of diabetes, recommended lifestyle interventions, dietary effects on blood sugar control -Counseled on s/sx of and management of  hypoglycemia -Next A1C anticipated 07/2020.   Written patient instructions provided.  Total time in telephone counseling 15 minutes.  Follow up w/ PCP.  Benard Halsted, PharmD, [place] [phone]

## 2020-05-14 ENCOUNTER — Encounter: Payer: Self-pay | Admitting: Pharmacist

## 2020-05-14 DIAGNOSIS — Z20828 Contact with and (suspected) exposure to other viral communicable diseases: Secondary | ICD-10-CM | POA: Diagnosis not present

## 2020-05-22 ENCOUNTER — Other Ambulatory Visit: Payer: Self-pay

## 2020-05-22 ENCOUNTER — Ambulatory Visit
Admission: RE | Admit: 2020-05-22 | Discharge: 2020-05-22 | Disposition: A | Discharge status: Home / Self Care | Payer: Medicaid Other | Source: Ambulatory Visit | Attending: Internal Medicine | Admitting: Internal Medicine

## 2020-05-22 DIAGNOSIS — R590 Localized enlarged lymph nodes: Secondary | ICD-10-CM | POA: Diagnosis not present

## 2020-05-22 DIAGNOSIS — R599 Enlarged lymph nodes, unspecified: Secondary | ICD-10-CM

## 2020-05-22 DIAGNOSIS — Z872 Personal history of diseases of the skin and subcutaneous tissue: Secondary | ICD-10-CM | POA: Diagnosis not present

## 2020-05-31 ENCOUNTER — Ambulatory Visit (HOSPITAL_COMMUNITY)
Admission: EM | Admit: 2020-05-31 | Discharge: 2020-05-31 | Disposition: A | Discharge status: Home / Self Care | Payer: Medicaid Other | Attending: Family Medicine | Admitting: Family Medicine

## 2020-05-31 ENCOUNTER — Other Ambulatory Visit: Payer: Self-pay

## 2020-05-31 ENCOUNTER — Encounter (HOSPITAL_COMMUNITY): Payer: Self-pay

## 2020-05-31 DIAGNOSIS — J3489 Other specified disorders of nose and nasal sinuses: Secondary | ICD-10-CM

## 2020-05-31 DIAGNOSIS — R0789 Other chest pain: Secondary | ICD-10-CM | POA: Diagnosis not present

## 2020-05-31 MED ORDER — NAPROXEN 500 MG PO TABS
500.0000 mg | ORAL_TABLET | Freq: Two times a day (BID) | ORAL | 0 refills | Status: DC
Start: 2020-05-31 — End: 2020-08-29

## 2020-05-31 MED ORDER — TIZANIDINE HCL 4 MG PO TABS
4.0000 mg | ORAL_TABLET | Freq: Four times a day (QID) | ORAL | 0 refills | Status: DC | PRN
Start: 2020-05-31 — End: 2020-08-29

## 2020-05-31 MED ORDER — KETOROLAC TROMETHAMINE 30 MG/ML IJ SOLN
INTRAMUSCULAR | Status: AC
  Filled 2020-05-31: qty 1

## 2020-05-31 MED ORDER — KETOROLAC TROMETHAMINE 30 MG/ML IJ SOLN
30.0000 mg | Freq: Once | INTRAMUSCULAR | Status: AC
  Administered 2020-05-31: 30 mg via INTRAMUSCULAR

## 2020-05-31 MED ORDER — FLUTICASONE PROPIONATE 50 MCG/ACT NA SUSP: 1.0000 | Freq: Every day | NASAL | 0 refills | Status: DC

## 2020-05-31 NOTE — ED Triage Notes (Signed)
Patient states she has had chest pain since this am and has pain on the bridge of her nose. Pt states the chest pain is somewhat worse when she takes a deep breath. Pt is aox4 and ambulatory.

## 2020-05-31 NOTE — Discharge Instructions (Addendum)
EKG normal I suspect pain related to muscles of chest and back Naprosyn twice daily for pain Tizanidine to supplement at home/bedtime-may cause drowsiness, do not drive or work after taking May try Flonase nasal spray 1 to 2 spray in each nostril daily Continue to monitor pain and follow-up if not improving worsening or changing

## 2020-05-31 NOTE — ED Provider Notes (Signed)
[place]    CSN: [id number] Arrival date & time: 05/31/20  1825      History   Chief Complaint Chief Complaint  Patient presents with  . Chest Pain    started today    HPI Ruth [name] is a 44 y.o. female history of DM type II, OSA, hypertension, asthma, fibromyalgia, presenting today for evaluation of chest pain.  Chest pain began this morning.  Pain worsens with inspiration.  Also reports associated back pain.  Denies pain radiating through chest to back.  Denies recent cough or URI symptoms.  Denies associated abdominal pain nausea or vomiting.  Denies worsening after eating.  Denies recent heavy lifting or trauma.  Does report tobacco use.  Denies leg pain or leg swelling.  Denies prior DVT/PE.  Also concerned about nose and ear pain.  Reports over the past couple days she has had some discomfort to the bridge of her nose.  She does report recently getting a new pair of glasses which have been frequently falling onto her nose.  Reports right ear very sensitive to loud noises.  Denies congestion cough or sore throat.  HPI  Past Medical History:  Diagnosis Date  . Anemia   . Arthritis    chronic back pain   . Asthma   . Bipolar 1 disorder ([place])   . Fibromyalgia   . Hx of migraines    occ  . Hypertension   . Hypothyroidism   . Obesity   . Sleep apnea   . Type 2 diabetes mellitus ([place])    Type 2    Patient Active Problem List   Diagnosis Date Noted  . Right arm pain 02/14/2020  . Thrombocytosis 01/26/2020  . Hyperlipidemia associated with type 2 diabetes mellitus ([place]) 09/19/2019  . Abnormal uterine bleeding (AUB) 12/07/2017  . Abnormal mammogram of both breasts 11/02/2016  . Hypothyroidism 06/21/2009  . Type 2 diabetes mellitus with peripheral neuropathy ([place]) 06/21/2009  . BIPOLAR AFFECTIVE DISORDER 06/21/2009  . 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 INADEQUATE SLEEP HYGIENE 06/21/2009  . OBSTRUCTIVE SLEEP  APNEA 06/21/2009  . 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 CARPAL TUNNEL SYNDROME 06/20/2009  . MERALGIA PARESTHETICA 06/20/2009  . Essential hypertension 06/20/2009  . FIBROMYALGIA 06/20/2009  . CEPHALGIA 06/20/2009    Past Surgical History:  Procedure Laterality Date  . CARPAL TUNNEL RELEASE Right   . CESAREAN SECTION     x2  . DILITATION & CURRETTAGE/HYSTROSCOPY WITH NOVASURE ABLATION N/A 12/07/2017   Procedure: DILATATION & CURETTAGE/HYSTEROSCOPY WITH ATTEMPED NOVASURE ABLATION, FAILED/ENDOMETERIAL ABLATION WITH HTA SYSTEM;  Surgeon: Lavonia Drafts, MD;  Location: [facility];  Service: Gynecology;  Laterality: N/A;  . ELBOW SURGERY     "surgery for nerve pain"  . GANGLION CYST EXCISION Right    right arm   . TONSILLECTOMY     childhood  . TUBAL LIGATION    . VAGINA SURGERY     fistula    OB History    Gravida  7   Para  5   Term  4   Preterm  1   AB  2   Living  5     SAB  2   TAB      Ectopic      Multiple      Live Births  5            Home Medications    Prior to Admission medications   Medication Sig Start Date End Date Taking?  Authorizing Provider  Accu-Chek FastClix Lancets MISC E1 1.65, FOUR BLOOD GLUCOSE TESTING FASTING ANND TWO HOURS AFTER SUPPER 07/01/18  Yes [provider]  ACCU-CHEK GUIDE test strip CHECK BLOOD SUGARS TWICE, E11.65 12/21/18  Yes [provider]  albuterol (ACCUNEB) 0.63 MG/3ML nebulizer solution USE 1 VIAL VIA NEBULIZER EVERY 4-6 HOURS AS NEEDED 01/25/20  Yes Nicolette Bang, DO  albuterol (VENTOLIN HFA) 108 (90 Base) MCG/ACT inhaler Inhale 1-2 puffs into the lungs every 6 (six) hours as needed for wheezing. 01/25/20  Yes Nicolette Bang, DO  amLODipine (NORVASC) 5 MG tablet Take 1 tablet (5 mg total) by mouth daily. 04/25/20  Yes Nicolette Bang, DO  atorvastatin (LIPITOR) 80 MG tablet Take 1 tablet (80 mg total) by mouth daily at 6 PM. 04/25/20  Yes Nicolette Bang,  DO  budesonide-formoterol [facility]) 160-4.5 MCG/ACT inhaler Inhale 2 puffs into the lungs 2 (two) times daily. 01/25/20  Yes Nicolette Bang, DO  cetirizine (ZYRTEC) 10 MG tablet Take 1 tablet (10 mg total) by mouth daily. 01/25/20  Yes Nicolette Bang, DO  gabapentin (NEURONTIN) 300 MG capsule Take 1 capsule (300 mg total) by mouth at bedtime. 04/25/20  Yes Nicolette Bang, DO  glipiZIDE (GLUCOTROL XL) 10 MG 24 hr tablet Take 1 tablet (10 mg total) by mouth daily with breakfast. 04/25/20  Yes Nicolette Bang, DO  insulin detemir (LEVEMIR) 100 UNIT/ML FlexPen Inject 38 Units into the skin 2 (two) times daily. 04/25/20  Yes Nicolette Bang, DO  Insulin Pen Needle (PEN NEEDLES) 32G X 4 MM MISC 1 each by Does not apply route 2 (two) times daily. 01/25/20  Yes Nicolette Bang, DO  sitaGLIPtin (JANUVIA) 100 MG tablet Take 1 tablet (100 mg total) by mouth daily. 04/25/20  Yes Nicolette Bang, DO  triamterene-hydrochlorothiazide (MAXZIDE-25) 37.5-25 MG tablet Take 1 tablet by mouth daily. 04/25/20  Yes Nicolette Bang, DO  fluticasone (FLONASE) 50 MCG/ACT nasal spray Place 1-2 sprays into both nostrils daily. 05/31/20   Terrica Duecker C, PA-C  naproxen (NAPROSYN) 500 MG tablet Take 1 tablet (500 mg total) by mouth 2 (two) times daily. 05/31/20   Latise Dilley C, PA-C  tiZANidine (ZANAFLEX) 4 MG tablet Take 1 tablet (4 mg total) by mouth every 6 (six) hours as needed for muscle spasms. 05/31/20   Kawanda Drumheller, Elesa Hacker, PA-C    Family History Family History  Problem Relation Age of Onset  . Thyroid disease Mother   . Diabetes Daughter   . Diabetes Son   . Breast cancer Neg Hx     Social History Social History   Tobacco Use  . Smoking status: Current Every Day Smoker    Packs/day: 0.25    Types: Cigarettes  . Smokeless tobacco: Never Used  Vaping Use  . Vaping Use: Never used  Substance Use Topics  . Alcohol use:  Yes    Comment: occasionally  . Drug use: Yes    Types: Marijuana     Allergies   Metformin and related, Penicillins, and Victoza [liraglutide]   Review of Systems Review of Systems  Constitutional: Negative for fatigue and fever.  HENT: Negative for congestion, sinus pressure and sore throat.   Eyes: Negative for photophobia, pain and visual disturbance.  Respiratory: Negative for cough and shortness of breath.   Cardiovascular: Positive for chest pain. Negative for leg swelling.  Gastrointestinal: Negative for abdominal pain, nausea and vomiting.  Genitourinary: Negative for decreased urine volume and hematuria.  Musculoskeletal: Negative for myalgias, neck pain and neck stiffness.  Neurological: Positive for headaches. Negative for dizziness, syncope, facial asymmetry, speech difficulty, weakness, light-headedness and numbness.     Physical Exam Triage Vital Signs ED Triage Vitals  Enc Vitals Group     BP 05/31/20 1834 125/88     Pulse Rate 05/31/20 1834 98     Resp 05/31/20 1834 18     Temp 05/31/20 1834 98.2 F (36.8 C)     Temp Source 05/31/20 1834 Oral     SpO2 05/31/20 1834 100 %     Weight --      Height --      Head Circumference --      Peak Flow --      Pain Score 05/31/20 1837 7     Pain Loc --      Pain Edu? --      Excl. in [place]? --    No data found.  Updated Vital Signs BP 125/88 (BP Location: Right Arm)   Pulse 98   Temp 98.2 F (36.8 C) (Oral)   Resp 18   LMP 05/25/2020 (Approximate)   SpO2 100%   Visual Acuity Right Eye Distance:   Left Eye Distance:   Bilateral Distance:    Right Eye Near:   Left Eye Near:    Bilateral Near:     Physical Exam Vitals and nursing note reviewed.  Constitutional:      Appearance: She is well-developed.     Comments: No acute distress  HENT:     Head: Normocephalic and atraumatic.     Ears:     Comments: Bilateral ears without tenderness to palpation of external auricle, tragus and mastoid, EAC's  without erythema or swelling, TM's with good bony landmarks and cone of light. Non erythematous.     Nose: Nose normal.     Comments: Nasal mucosa pink, minimally swollen turbinates    Mouth/Throat:     Comments: Oral mucosa pink and moist, no tonsillar enlargement or exudate. Posterior pharynx patent and nonerythematous, no uvula deviation or swelling. Normal phonation. Eyes:     Conjunctiva/sclera: Conjunctivae normal.  Cardiovascular:     Rate and Rhythm: Normal rate and regular rhythm.  Pulmonary:     Effort: Pulmonary effort is normal. No respiratory distress.     Comments: Breathing comfortably at rest, CTABL, no wheezing, rales or other adventitious sounds auscultated  Significant tenderness to palpation throughout central and left anterior chest Abdominal:     General: There is no distension.     Comments: Nontender to palpation throughout abdomen  Musculoskeletal:        General: Normal range of motion.     Cervical back: Neck supple.     Comments: Tenderness to palpation along bilateral upper thoracic areas, most prominent along left superior trapezius  Full active range of motion of upper extremities  Skin:    General: Skin is warm and dry.  Neurological:     Mental Status: She is alert and oriented to person, place, and time.      UC Treatments / Results  Labs (all labs ordered are listed, but only abnormal results are displayed) Labs Reviewed - No data to display  EKG   Radiology No results found.  Procedures Procedures (including critical care time)  Medications Ordered in UC Medications  ketorolac (TORADOL) 30 MG/ML injection 30 mg (has no administration in time range)    Initial Impression / Assessment and Plan / UC Course  I have reviewed the triage vital signs and the nursing notes.  Pertinent labs & imaging results that were available during my care of the patient were reviewed by me and considered in my medical decision making (see chart for  details).     EKG normal sinus rhythm, no acute signs of ischemia or infarction, significant  reproducible tenderness on palpation, suspect chest wall pain/MSK etiology.  Lower suspicion of underlying ACS, DVT, GERD, pulmonary cause.  Providing Toradol prior to discharge, continuing on Naprosyn and tizanidine.  ENT exam unremarkable, continue with anti-inflammatories, Flonase and continued monitoring.  Discussed strict return precautions. Patient verbalized understanding and is agreeable with plan.  Final Clinical Impressions(s) / UC Diagnoses   Final diagnoses:  Chest wall pain  Nasal pain     Discharge Instructions     EKG normal I suspect pain related to muscles of chest and back Naprosyn twice daily for pain Tizanidine to supplement at home/bedtime-may cause drowsiness, do not drive or work after taking May try Flonase nasal spray 1 to 2 spray in each nostril daily Continue to monitor pain and follow-up if not improving worsening or changing    ED Prescriptions    Medication Sig Dispense Auth. Provider   naproxen (NAPROSYN) 500 MG tablet Take 1 tablet (500 mg total) by mouth 2 (two) times daily. 30 tablet Allianna Beaubien C, PA-C   tiZANidine (ZANAFLEX) 4 MG tablet Take 1 tablet (4 mg total) by mouth every 6 (six) hours as needed for muscle spasms. 30 tablet Marcelus Dubberly C, PA-C   fluticasone (FLONASE) 50 MCG/ACT nasal spray Place 1-2 sprays into both nostrils daily. 16 g Brayton Baumgartner, [place] C, PA-C     PDMP not reviewed this encounter.   Janith Lima, [place] 05/31/20 1911

## 2020-06-09 DIAGNOSIS — H5213 Myopia, bilateral: Secondary | ICD-10-CM | POA: Diagnosis not present

## 2020-06-20 ENCOUNTER — Other Ambulatory Visit: Payer: Self-pay

## 2020-06-20 ENCOUNTER — Ambulatory Visit (HOSPITAL_COMMUNITY)
Admission: EM | Admit: 2020-06-20 | Discharge: 2020-06-20 | Disposition: A | Discharge status: Home / Self Care | Payer: Medicaid Other | Attending: Family Medicine | Admitting: Family Medicine

## 2020-06-20 DIAGNOSIS — M5412 Radiculopathy, cervical region: Secondary | ICD-10-CM | POA: Diagnosis not present

## 2020-06-20 DIAGNOSIS — G44209 Tension-type headache, unspecified, not intractable: Secondary | ICD-10-CM | POA: Diagnosis not present

## 2020-06-20 MED ORDER — KETOROLAC TROMETHAMINE 30 MG/ML IJ SOLN
30.0000 mg | Freq: Once | INTRAMUSCULAR | Status: AC
  Administered 2020-06-20: 30 mg via INTRAMUSCULAR

## 2020-06-20 MED ORDER — KETOROLAC TROMETHAMINE 30 MG/ML IJ SOLN
INTRAMUSCULAR | Status: AC
  Filled 2020-06-20: qty 1

## 2020-06-20 NOTE — Discharge Instructions (Addendum)
Please try gabapentin  Please try the exercises  Please follow up with sports medicine.

## 2020-06-20 NOTE — ED Provider Notes (Signed)
Ruth [name]    CSN: [id number] Arrival date & time: 06/20/20  1647      History   Chief Complaint Chief Complaint  Patient presents with  . Neck Pain  . Numbness    Hands, arms and shoulders    HPI Ruth [name] is a 44 y.o. female.   She is presenting with headache in the posterior aspect as well as altered sensation down the right arm and down to the elbow of the left arm.  She reports her symptoms have been ongoing for about a day.  Denies any history of similar pain.  Does have uncontrolled diabetes.  Denies any injury or inciting event.  She works as a Scientist, water quality at [employer].  Has not taken anything for the pain.  HPI  Past Medical History:  Diagnosis Date  . Anemia   . Arthritis    chronic back pain   . Asthma   . Bipolar 1 disorder ([place])   . Fibromyalgia   . Hx of migraines    occ  . Hypertension   . Hypothyroidism   . Obesity   . Sleep apnea   . Type 2 diabetes mellitus ([place])    Type 2    Patient Active Problem List   Diagnosis Date Noted  . Right arm pain 02/14/2020  . Thrombocytosis 01/26/2020  . Hyperlipidemia associated with type 2 diabetes mellitus ([place]) 09/19/2019  . Abnormal uterine bleeding (AUB) 12/07/2017  . Abnormal mammogram of both breasts 11/02/2016  . Hypothyroidism 06/21/2009  . Type 2 diabetes mellitus with peripheral neuropathy ([place]) 06/21/2009  . BIPOLAR AFFECTIVE DISORDER 06/21/2009  . 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 INADEQUATE SLEEP HYGIENE 06/21/2009  . OBSTRUCTIVE SLEEP APNEA 06/21/2009  . 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 CARPAL TUNNEL SYNDROME 06/20/2009  . MERALGIA PARESTHETICA 06/20/2009  . Essential hypertension 06/20/2009  . FIBROMYALGIA 06/20/2009  . CEPHALGIA 06/20/2009    Past Surgical History:  Procedure Laterality Date  . CARPAL TUNNEL RELEASE Right   . CESAREAN SECTION     x2  . DILITATION & CURRETTAGE/HYSTROSCOPY WITH NOVASURE ABLATION N/A 12/07/2017    Procedure: DILATATION & CURETTAGE/HYSTEROSCOPY WITH ATTEMPED NOVASURE ABLATION, FAILED/ENDOMETERIAL ABLATION WITH HTA SYSTEM;  Surgeon: Lavonia Drafts, MD;  Location: [facility];  Service: Gynecology;  Laterality: N/A;  . ELBOW SURGERY     "surgery for nerve pain"  . GANGLION CYST EXCISION Right    right arm   . TONSILLECTOMY     childhood  . TUBAL LIGATION    . VAGINA SURGERY     fistula    OB History    Gravida  7   Para  5   Term  4   Preterm  1   AB  2   Living  5     SAB  2   IAB      Ectopic      Multiple      Live Births  5            Home Medications    Prior to Admission medications   Medication Sig Start Date End Date Taking? Authorizing Provider  amLODipine (NORVASC) 5 MG tablet Take 1 tablet (5 mg total) by mouth daily. 04/25/20  Yes Nicolette Bang, DO  atorvastatin (LIPITOR) 80 MG tablet Take 1 tablet (80 mg total) by mouth daily at 6 PM. 04/25/20  Yes Nicolette Bang, DO  budesonide-formoterol [facility]) 160-4.5 MCG/ACT inhaler Inhale 2 puffs into the lungs 2 (two)  times daily. 01/25/20  Yes Nicolette Bang, DO  fluticasone (FLONASE) 50 MCG/ACT nasal spray Place 1-2 sprays into both nostrils daily. 05/31/20  Yes Wieters, Hallie C, PA-C  gabapentin (NEURONTIN) 300 MG capsule Take 1 capsule (300 mg total) by mouth at bedtime. 04/25/20  Yes Nicolette Bang, DO  glipiZIDE (GLUCOTROL XL) 10 MG 24 hr tablet Take 1 tablet (10 mg total) by mouth daily with breakfast. 04/25/20  Yes Nicolette Bang, DO  insulin detemir (LEVEMIR) 100 UNIT/ML FlexPen Inject 38 Units into the skin 2 (two) times daily. 04/25/20  Yes Nicolette Bang, DO  sitaGLIPtin (JANUVIA) 100 MG tablet Take 1 tablet (100 mg total) by mouth daily. 04/25/20  Yes Nicolette Bang, DO  triamterene-hydrochlorothiazide (MAXZIDE-25) 37.5-25 MG tablet Take 1 tablet by mouth daily. 04/25/20  Yes Nicolette Bang, DO   Accu-Chek FastClix Lancets MISC E1 1.65, FOUR BLOOD GLUCOSE TESTING FASTING ANND TWO HOURS AFTER SUPPER 07/01/18   [provider]  ACCU-CHEK GUIDE test strip CHECK BLOOD SUGARS TWICE, E11.65 12/21/18   [provider]  albuterol (ACCUNEB) 0.63 MG/3ML nebulizer solution USE 1 VIAL VIA NEBULIZER EVERY 4-6 HOURS AS NEEDED 01/25/20   Nicolette Bang, DO  albuterol (VENTOLIN HFA) 108 (90 Base) MCG/ACT inhaler Inhale 1-2 puffs into the lungs every 6 (six) hours as needed for wheezing. 01/25/20   Nicolette Bang, DO  cetirizine (ZYRTEC) 10 MG tablet Take 1 tablet (10 mg total) by mouth daily. 01/25/20   Nicolette Bang, DO  Insulin Pen Needle (PEN NEEDLES) 32G X 4 MM MISC 1 each by Does not apply route 2 (two) times daily. 01/25/20   Nicolette Bang, DO  naproxen (NAPROSYN) 500 MG tablet Take 1 tablet (500 mg total) by mouth 2 (two) times daily. 05/31/20   Wieters, Hallie C, PA-C  tiZANidine (ZANAFLEX) 4 MG tablet Take 1 tablet (4 mg total) by mouth every 6 (six) hours as needed for muscle spasms. 05/31/20   Wieters, Elesa Hacker, PA-C    Family History Family History  Problem Relation Age of Onset  . Thyroid disease Mother   . Diabetes Daughter   . Diabetes Son   . Breast cancer Neg Hx     Social History Social History   Tobacco Use  . Smoking status: Current Every Day Smoker    Packs/day: 0.25    Types: Cigarettes  . Smokeless tobacco: Never Used  Vaping Use  . Vaping Use: Never used  Substance Use Topics  . Alcohol use: Yes    Comment: occasionally  . Drug use: Yes    Types: Marijuana     Allergies   Metformin and related, Penicillins, and Victoza [liraglutide]   Review of Systems Review of Systems  See HPI  Physical Exam Triage Vital Signs ED Triage Vitals  Enc Vitals Group     BP 06/20/20 1810 (!) 159/95     Pulse Rate 06/20/20 1810 82     Resp 06/20/20 1810 18     Temp 06/20/20 1810 98.3 F (36.8 C)     Temp  Source 06/20/20 1810 Oral     SpO2 06/20/20 1810 100 %     Weight --      Height --      Head Circumference --      Peak Flow --      Pain Score 06/20/20 1806 8     Pain Loc --      Pain Edu? --  Excl. in [place]? --    No data found.  Updated Vital Signs BP (!) 159/95 (BP Location: Left Wrist)   Pulse 82   Temp 98.3 F (36.8 C) (Oral)   Resp 18   LMP 05/25/2020 (Approximate)   SpO2 100%   Visual Acuity Right Eye Distance:   Left Eye Distance:   Bilateral Distance:    Right Eye Near:   Left Eye Near:    Bilateral Near:     Physical Exam Gen: NAD, alert, cooperative with exam, well-appearing ENT: normal lips, normal nasal mucosa,  Eye: normal EOM, normal conjunctiva and lids Skin: no rashes, no areas of induration  Neuro: normal tone, normal sensation to touch Psych:  normal insight, alert and oriented MSK:  Neck: Normal range of motion in flexion extension. Normal lateral rotation. Normal shoulder range of motion. Normal pincer grasp. No signs of atrophy. Normal grip strength. Neurovascular intact   UC Treatments / Results  Labs (all labs ordered are listed, but only abnormal results are displayed) Labs Reviewed - No data to display  EKG   Radiology No results found.  Procedures Procedures (including critical care time)  Medications Ordered in UC Medications  ketorolac (TORADOL) 30 MG/ML injection 30 mg (30 mg Intramuscular Given 06/20/20 1903)    Initial Impression / Assessment and Plan / UC Course  I have reviewed the triage vital signs and the nursing notes.  Pertinent labs & imaging results that were available during my care of the patient were reviewed by me and considered in my medical decision making (see chart for details).     Ruth [name] is a 44 year old female is presenting with suggestion of tension headache as well as possible cervical radiculopathy.  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  She could try gabapentin.  She has this at home.  Counseled on home exercise therapy and supportive care.  Provided work note.  Counseled on follow-up.  Final Clinical Impressions(s) / UC Diagnoses   Final diagnoses:  Tension headache  Cervical radiculopathy     Discharge Instructions     Please try gabapentin  Please try the exercises  Please follow up with sports medicine.     ED Prescriptions    None     PDMP not reviewed this encounter.   Rosemarie Ax, MD 06/20/20 2141

## 2020-06-20 NOTE — ED Triage Notes (Signed)
Pt c/o bilateral hand numbness X 1 day. Pt states she is having hedaches, neck pain, numbness on shoulder radiating to her arms. Pt denies fever, SOB and chest pain.

## 2020-07-19 DIAGNOSIS — Z03818 Encounter for observation for suspected exposure to other biological agents ruled out: Secondary | ICD-10-CM | POA: Diagnosis not present

## 2020-08-07 DIAGNOSIS — Z03818 Encounter for observation for suspected exposure to other biological agents ruled out: Secondary | ICD-10-CM | POA: Diagnosis not present

## 2020-08-29 ENCOUNTER — Ambulatory Visit (HOSPITAL_COMMUNITY)
Admission: EM | Admit: 2020-08-29 | Discharge: 2020-08-29 | Disposition: A | Discharge status: Home / Self Care | Payer: Medicaid Other | Attending: Family Medicine | Admitting: Family Medicine

## 2020-08-29 ENCOUNTER — Ambulatory Visit (INDEPENDENT_AMBULATORY_CARE_PROVIDER_SITE_OTHER): Payer: Medicaid Other

## 2020-08-29 ENCOUNTER — Encounter (HOSPITAL_COMMUNITY): Payer: Self-pay | Admitting: Family Medicine

## 2020-08-29 ENCOUNTER — Other Ambulatory Visit: Payer: Self-pay

## 2020-08-29 DIAGNOSIS — S6991XA Unspecified injury of right wrist, hand and finger(s), initial encounter: Secondary | ICD-10-CM | POA: Diagnosis not present

## 2020-08-29 DIAGNOSIS — W2201XA Walked into wall, initial encounter: Secondary | ICD-10-CM

## 2020-08-29 DIAGNOSIS — M79631 Pain in right forearm: Secondary | ICD-10-CM | POA: Diagnosis not present

## 2020-08-29 DIAGNOSIS — M7989 Other specified soft tissue disorders: Secondary | ICD-10-CM | POA: Diagnosis not present

## 2020-08-29 DIAGNOSIS — H10533 Contact blepharoconjunctivitis, bilateral: Secondary | ICD-10-CM | POA: Diagnosis not present

## 2020-08-29 DIAGNOSIS — S6721XA Crushing injury of right hand, initial encounter: Secondary | ICD-10-CM

## 2020-08-29 DIAGNOSIS — B9689 Other specified bacterial agents as the cause of diseases classified elsewhere: Secondary | ICD-10-CM

## 2020-08-29 DIAGNOSIS — S59911A Unspecified injury of right forearm, initial encounter: Secondary | ICD-10-CM | POA: Diagnosis not present

## 2020-08-29 MED ORDER — ERYTHROMYCIN 5 MG/GM OP OINT
TOPICAL_OINTMENT | OPHTHALMIC | 0 refills | Status: AC
Start: 2020-08-29 — End: 2020-09-03

## 2020-08-29 MED ORDER — CYCLOBENZAPRINE HCL 10 MG PO TABS
10.0000 mg | ORAL_TABLET | Freq: Three times a day (TID) | ORAL | 0 refills | Status: DC | PRN
Start: 2020-08-29 — End: 2020-12-22

## 2020-08-29 MED ORDER — FLUCONAZOLE 150 MG PO TABS
150.0000 mg | ORAL_TABLET | Freq: Once | ORAL | 0 refills | Status: AC
Start: 2020-08-29 — End: 2020-08-29

## 2020-08-29 MED ORDER — NAPROXEN 500 MG PO TABS
500.0000 mg | ORAL_TABLET | Freq: Two times a day (BID) | ORAL | 0 refills | Status: DC
Start: 2020-08-29 — End: 2020-12-22

## 2020-08-29 MED ORDER — DOXYCYCLINE HYCLATE 100 MG PO CAPS
100.0000 mg | ORAL_CAPSULE | Freq: Two times a day (BID) | ORAL | 0 refills | Status: DC
Start: 2020-08-29 — End: 2020-10-10

## 2020-08-29 NOTE — ED Provider Notes (Signed)
[place]    CSN: [id number] Arrival date & time: 08/29/20  6811      History   Chief Complaint Chief Complaint  Patient presents with  . Hand Problem  . Eye Problem    HPI Ruth [name] is a 45 y.o. female.   HPI Patient presents today for evaluation of right hand injury after punching a wall x 2 day out of anger. She endorses right hand swelling and pain since injury occurred.  She has elevated to reduce swelling and has taken Tylenol without any resolution of pain.    Patient also complains of bilateral eye redness, crusting, swelling, upon awakening this morning. Endorses irritation and burning of both eyes.  Notices discolored drainage this morning, frequent tearing since awakening. No contact lens wearing or sensation of FB.  Patient also has another complaint of a draining boil located underneath her right axillary region.  The area has been draining persistently over the course of the week.  She manually expressed drainage from the site and has had some scant bleeding since this time.  She endorses tenderness surrounding the boil and has noticed that it has been reddened than it was over a week ago.  Denies any history of recurrent abscess or boils. Past Medical History:  Diagnosis Date  . Anemia   . Arthritis    chronic back pain   . Asthma   . Bipolar 1 disorder ([place])   . Fibromyalgia   . Hx of migraines    occ  . Hypertension   . Hypothyroidism   . Obesity   . Sleep apnea   . Type 2 diabetes mellitus ([place])    Type 2    Patient Active Problem List   Diagnosis Date Noted  . Right arm pain 02/14/2020  . Thrombocytosis 01/26/2020  . Hyperlipidemia associated with type 2 diabetes mellitus ([place]) 09/19/2019  . Abnormal uterine bleeding (AUB) 12/07/2017  . Abnormal mammogram of both breasts 11/02/2016  . Hypothyroidism 06/21/2009  . Type 2 diabetes mellitus with peripheral neuropathy ([name]) 06/21/2009  . BIPOLAR AFFECTIVE DISORDER  06/21/2009  . 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 INADEQUATE SLEEP HYGIENE 06/21/2009  . OBSTRUCTIVE SLEEP APNEA 06/21/2009  . 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 CARPAL TUNNEL SYNDROME 06/20/2009  . MERALGIA PARESTHETICA 06/20/2009  . Essential hypertension 06/20/2009  . FIBROMYALGIA 06/20/2009  . CEPHALGIA 06/20/2009    Past Surgical History:  Procedure Laterality Date  . CARPAL TUNNEL RELEASE Right   . CESAREAN SECTION     x2  . DILITATION & CURRETTAGE/HYSTROSCOPY WITH NOVASURE ABLATION N/A 12/07/2017   Procedure: DILATATION & CURETTAGE/HYSTEROSCOPY WITH ATTEMPED NOVASURE ABLATION, FAILED/ENDOMETERIAL ABLATION WITH HTA SYSTEM;  Surgeon: Lavonia Drafts, MD;  Location: [facility];  Service: Gynecology;  Laterality: N/A;  . ELBOW SURGERY     "surgery for nerve pain"  . GANGLION CYST EXCISION Right    right arm   . TONSILLECTOMY     childhood  . TUBAL LIGATION    . VAGINA SURGERY     fistula    OB History    Gravida  7   Para  5   Term  4   Preterm  1   AB  2   Living  5     SAB  2   IAB      Ectopic      Multiple      Live Births  5            Home  Medications    Prior to Admission medications   Medication Sig Start Date End Date Taking? Authorizing Provider  Accu-Chek FastClix Lancets MISC E1 1.65, FOUR BLOOD GLUCOSE TESTING FASTING ANND TWO HOURS AFTER SUPPER 07/01/18   [provider]  ACCU-CHEK GUIDE test strip CHECK BLOOD SUGARS TWICE, E11.65 12/21/18   [provider]  albuterol (ACCUNEB) 0.63 MG/3ML nebulizer solution USE 1 VIAL VIA NEBULIZER EVERY 4-6 HOURS AS NEEDED 01/25/20   Nicolette Bang, DO  albuterol (VENTOLIN HFA) 108 (90 Base) MCG/ACT inhaler Inhale 1-2 puffs into the lungs every 6 (six) hours as needed for wheezing. 01/25/20   Nicolette Bang, DO  amLODipine (NORVASC) 5 MG tablet Take 1 tablet (5 mg total) by mouth daily. 04/25/20   Nicolette Bang, DO   atorvastatin (LIPITOR) 80 MG tablet Take 1 tablet (80 mg total) by mouth daily at 6 PM. 04/25/20   Nicolette Bang, DO  budesonide-formoterol [facility]) 160-4.5 MCG/ACT inhaler Inhale 2 puffs into the lungs 2 (two) times daily. 01/25/20   Nicolette Bang, DO  cetirizine (ZYRTEC) 10 MG tablet Take 1 tablet (10 mg total) by mouth daily. 01/25/20   Nicolette Bang, DO  fluticasone (FLONASE) 50 MCG/ACT nasal spray Place 1-2 sprays into both nostrils daily. 05/31/20   Wieters, Hallie C, PA-C  gabapentin (NEURONTIN) 300 MG capsule Take 1 capsule (300 mg total) by mouth at bedtime. 04/25/20   Nicolette Bang, DO  glipiZIDE (GLUCOTROL XL) 10 MG 24 hr tablet Take 1 tablet (10 mg total) by mouth daily with breakfast. 04/25/20   Nicolette Bang, DO  insulin detemir (LEVEMIR) 100 UNIT/ML FlexPen Inject 38 Units into the skin 2 (two) times daily. 04/25/20   Nicolette Bang, DO  Insulin Pen Needle (PEN NEEDLES) 32G X 4 MM MISC 1 each by Does not apply route 2 (two) times daily. 01/25/20   Nicolette Bang, DO  naproxen (NAPROSYN) 500 MG tablet Take 1 tablet (500 mg total) by mouth 2 (two) times daily. 05/31/20   Wieters, Hallie C, PA-C  sitaGLIPtin (JANUVIA) 100 MG tablet Take 1 tablet (100 mg total) by mouth daily. 04/25/20   Nicolette Bang, DO  tiZANidine (ZANAFLEX) 4 MG tablet Take 1 tablet (4 mg total) by mouth every 6 (six) hours as needed for muscle spasms. 05/31/20   Wieters, Hallie C, PA-C  triamterene-hydrochlorothiazide (MAXZIDE-25) 37.5-25 MG tablet Take 1 tablet by mouth daily. 04/25/20   Nicolette Bang, DO    Family History Family History  Problem Relation Age of Onset  . Thyroid disease Mother   . Diabetes Daughter   . Diabetes Son   . Breast cancer Neg Hx     Social History Social History   Tobacco Use  . Smoking status: Current Every Day Smoker    Packs/day: 0.25    Types: Cigarettes  .  Smokeless tobacco: Never Used  Vaping Use  . Vaping Use: Never used  Substance Use Topics  . Alcohol use: Yes    Comment: occasionally  . Drug use: Yes    Types: Marijuana     Allergies   Metformin and related, Penicillins, and Victoza [liraglutide] Review of Systems Review of Systems Pertinent negatives listed in HPI  Physical Exam Triage Vital Signs ED Triage Vitals  Enc Vitals Group     BP 08/29/20 0832 (!) 143/92     Pulse Rate 08/29/20 0832 88     Resp 08/29/20 0832 20     Temp 08/29/20  0832 98.2 F (36.8 C)     Temp Source 08/29/20 0832 Oral     SpO2 08/29/20 0832 99 %     Weight --      Height --      Head Circumference --      Peak Flow --      Pain Score 08/29/20 0830 8     Pain Loc --      Pain Edu? --      Excl. in [place]? --    No data found.  Updated Vital Signs BP (!) 143/92 (BP Location: Left Arm)   Pulse 88   Temp 98.2 F (36.8 C) (Oral)   Resp 20   LMP  (Approximate)   SpO2 99%   Visual Acuity Right Eye Distance: 20/30 (With correction ) Left Eye Distance: 20/30 (With correction ) Bilateral Distance: 20/30 (With correction )  Right Eye Near:   Left Eye Near:    Bilateral Near:     Physical Exam Constitutional:      Appearance: She is obese.  Eyes:     General:        Right eye: Discharge present.        Left eye: Discharge present.    Extraocular Movements: Extraocular movements intact.     Conjunctiva/sclera:     Right eye: Right conjunctiva is injected. Exudate present.     Left eye: Left conjunctiva is injected. Exudate present.     Comments: Upper and lower eyelids bilaterally edematous. No palpable tenderness of eye lids   Musculoskeletal:     Right forearm: Swelling and tenderness present.     Right wrist: Swelling, tenderness and bony tenderness present. Decreased range of motion.     Right hand: Swelling and bony tenderness present. Decreased range of motion.  Skin:    Comments: Skin ulceration right axilla    Neurological:     Mental Status: She is alert.  Psychiatric:        Attention and Perception: Attention normal.        Mood and Affect: Mood normal.        Behavior: Behavior normal.        Thought Content: Thought content normal.      UC Treatments / Results  Labs (all labs ordered are listed, but only abnormal results are displayed) Labs Reviewed - No data to display  EKG   Radiology DG Forearm Right  Result Date: 08/29/2020 CLINICAL DATA:  Pain after hitting wall EXAM: RIGHT FOREARM - 2 VIEW COMPARISON:  None. FINDINGS: Frontal and lateral views were obtained. No fracture or dislocation. Joint spaces appear normal. No erosive change. IMPRESSION: No fracture or dislocation.  No evident arthropathy. Electronically Signed   By: Lowella Grip III M.D.   On: 08/29/2020 09:27   DG Hand Complete Right  Result Date: 08/29/2020 CLINICAL DATA:  Soft tissue swelling after hitting wall EXAM: RIGHT HAND - COMPLETE 3+ VIEW COMPARISON:  None. FINDINGS: Frontal, oblique, and lateral views were obtained. There is soft tissue swelling dorsally and medially. No evident fracture or dislocation. Fifth middle phalanx is somewhat hypoplastic, an anatomic variant. No joint space narrowing or erosion. IMPRESSION: Soft tissue swelling. No fracture or dislocation. No evident arthropathy. Electronically Signed   By: Lowella Grip III M.D.   On: 08/29/2020 09:26    Procedures Procedures (including critical care time)  Medications Ordered in UC Medications - No data to display  Initial Impression / Assessment and Plan / UC Course  I have reviewed the triage vital signs and the nursing notes.  Pertinent labs & imaging results that were available during my care of the patient were reviewed by me and considered in my medical decision making (see chart for details).     Imaging of the right forearm is negative for any acute fracture.  Treating as a wrist hand crushing injury given mechanism of  injury. Naproxen 500 mg BID PRN for hand pain.  For acute pain Flexeril twice daily as needed with precautions that medication can cause severe drowsiness.  Wrapped in an Ace wrap to allow for comfort.  Patient may continue Ace wrap as needed for comfort measures.  Patient encouraged to perform RICE to reduce swelling and inflammation related to injury.  For eye infection erythromycin 1 ribbon to lower eyelid of both eyes at bedtime for total of 7 days.  For acute bacterial infection involving the right axilla doxycycline 100 mg twice daily for total of 2 days with return precautions if the area begins to drain or increases in diameter.  Patient verbalized understanding agreement with plan also advised to follow-up with primary care provider blood pressure slightly elevated per vital signs today.  Final Clinical Impressions(s) / UC Diagnoses   Final diagnoses:  Contact blepharoconjunctivitis of both eyes  Bacterial skin infection  Crushing injury of right hand, initial encounter     Discharge Instructions     Start doxycycline 100 mg twice daily for total of 10 days for treatment of wound infection.  Diflucan prescribed to prevent yeast take 1 tablet today repeat in 3 days.  For eye infection apply erythromycin one ribbon to the lower eyelid of each eye at bedtime for 7 days.  Your hand injury was negative for fracture . For hand swelling apply ice and keep it elevated. I have sent over a prescription for naproxen for you to take over the next few days to help reduce swelling and pain.  For acute pain I have prescribed you cyclobenzaprine 10 mg you may take up to 3 times daily as needed however medication causes drowsiness so avoid taking while at work or when driving.    ED Prescriptions    Medication Sig Dispense Auth. Provider   doxycycline (VIBRAMYCIN) 100 MG capsule Take 1 capsule (100 mg total) by mouth 2 (two) times daily. 20 capsule Scot Jun, FNP   fluconazole (DIFLUCAN)  150 MG tablet Take 1 tablet (150 mg total) by mouth once for 1 dose. Repeat if needed 1 tablet Scot Jun, FNP   erythromycin ophthalmic ointment Place a 1/2 inch ribbon of ointment into the lower eyelid of both eyes. 3.5 g Scot Jun, FNP   naproxen (NAPROSYN) 500 MG tablet Take 1 tablet (500 mg total) by mouth 2 (two) times daily. 30 tablet Scot Jun, FNP   cyclobenzaprine (FLEXERIL) 10 MG tablet Take 1 tablet (10 mg total) by mouth 3 (three) times daily as needed for muscle spasms. 20 tablet Scot Jun, FNP     PDMP not reviewed this encounter.   Scot Jun, FNP 09/02/20 [phone]

## 2020-08-29 NOTE — ED Triage Notes (Signed)
Pt presents with pain and swelling in right hand x 1 day after punching a wall.   Pt reports swelling and redness in eye since this morning. Denies drainage, pain.

## 2020-08-29 NOTE — Discharge Instructions (Addendum)
Start doxycycline 100 mg twice daily for total of 10 days for treatment of wound infection.  Diflucan prescribed to prevent yeast take 1 tablet today repeat in 3 days.  For eye infection apply erythromycin one ribbon to the lower eyelid of each eye at bedtime for 7 days.  Your hand injury was negative for fracture . For hand swelling apply ice and keep it elevated. I have sent over a prescription for naproxen for you to take over the next few days to help reduce swelling and pain.  For acute pain I have prescribed you cyclobenzaprine 10 mg you may take up to 3 times daily as needed however medication causes drowsiness so avoid taking while at work or when driving.

## 2020-09-03 ENCOUNTER — Ambulatory Visit (INDEPENDENT_AMBULATORY_CARE_PROVIDER_SITE_OTHER): Payer: Medicaid Other | Admitting: Obstetrics and Gynecology

## 2020-09-03 ENCOUNTER — Encounter: Payer: Self-pay | Admitting: Obstetrics and Gynecology

## 2020-09-03 ENCOUNTER — Other Ambulatory Visit: Payer: Self-pay

## 2020-09-03 VITALS — BP 147/91 | HR 87 | Ht 66.0 in | Wt 300.5 lb

## 2020-09-03 DIAGNOSIS — N946 Dysmenorrhea, unspecified: Secondary | ICD-10-CM

## 2020-09-03 DIAGNOSIS — Z6841 Body Mass Index (BMI) 40.0 and over, adult: Secondary | ICD-10-CM | POA: Diagnosis not present

## 2020-09-03 DIAGNOSIS — N920 Excessive and frequent menstruation with regular cycle: Secondary | ICD-10-CM | POA: Diagnosis not present

## 2020-09-03 NOTE — Progress Notes (Signed)
Obstetrics and Gynecology Visit Return Patient Evaluation  Appointment Date: 09/04/2020  Primary Care Provider: [place], [place] for [facility]  Chief Complaint: dysmenorrhea, menorrhagia  History of Present Illness:  Ruth [name] is a 45 y.o. P4 (LMP: 08/14/20) with above CC. PMHx significant for h/o HTA ablation  12/2017, h/o endometrial polyps, BMI 49, HTN, poorly controlled DM2, hypothyroidism  Patient had HTA done for heavy, painful, monthly periods.  The patient states that the ablation never worked.  She states she had an IUD and depo in the past and had bleeding issues and gained weight on the depo and that OCPs didn't work.   She is currently having regular, monthly, periods that last a week and are heavy and painful.   She denies any intermenstrual bleeding. +hot flashes, night sweats since her 30s.   Pap and hpv neg 2019  Review of Systems: as noted in the History of Present Illness.   Patient Active Problem List   Diagnosis Date Noted  . BMI 45.0-49.9, adult ([place]) 09/04/2020  . Right arm pain 02/14/2020  . Hyperlipidemia associated with type 2 diabetes mellitus ([place]) 09/19/2019  . Abnormal uterine bleeding (AUB) 12/07/2017  . Abnormal mammogram of both breasts 11/02/2016  . Hypothyroidism 06/21/2009  . Type 2 diabetes mellitus with peripheral neuropathy ([place]) 06/21/2009  . BIPOLAR AFFECTIVE DISORDER 06/21/2009  . 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 INADEQUATE SLEEP HYGIENE 06/21/2009  . OBSTRUCTIVE SLEEP APNEA 06/21/2009  . 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 CARPAL TUNNEL SYNDROME 06/20/2009  . MERALGIA PARESTHETICA 06/20/2009  . Essential hypertension 06/20/2009  . FIBROMYALGIA 06/20/2009  . CEPHALGIA 06/20/2009   Medications:  Caryl Ada. Deangelo had no medications administered during this visit. Current Outpatient Medications  Medication Sig Dispense Refill  .  Accu-Chek FastClix Lancets MISC E1 1.65, FOUR BLOOD GLUCOSE TESTING FASTING ANND TWO HOURS AFTER SUPPER    . ACCU-CHEK GUIDE test strip CHECK BLOOD SUGARS TWICE, E11.65    . albuterol (ACCUNEB) 0.63 MG/3ML nebulizer solution USE 1 VIAL VIA NEBULIZER EVERY 4-6 HOURS AS NEEDED 75 mL 3  . albuterol (VENTOLIN HFA) 108 (90 Base) MCG/ACT inhaler Inhale 1-2 puffs into the lungs every 6 (six) hours as needed for wheezing. 18 g 5  . amLODipine (NORVASC) 5 MG tablet Take 1 tablet (5 mg total) by mouth daily. 30 tablet 5  . atorvastatin (LIPITOR) 80 MG tablet Take 1 tablet (80 mg total) by mouth daily at 6 PM. 30 tablet 5  . budesonide-formoterol (SYMBICORT) 160-4.5 MCG/ACT inhaler Inhale 2 puffs into the lungs 2 (two) times daily. 1 Inhaler 5  . cetirizine (ZYRTEC) 10 MG tablet Take 1 tablet (10 mg total) by mouth daily. 30 tablet 11  . cyclobenzaprine (FLEXERIL) 10 MG tablet Take 1 tablet (10 mg total) by mouth 3 (three) times daily as needed for muscle spasms. 20 tablet 0  . doxycycline (VIBRAMYCIN) 100 MG capsule Take 1 capsule (100 mg total) by mouth 2 (two) times daily. 20 capsule 0  . fluticasone (FLONASE) 50 MCG/ACT nasal spray Place 1-2 sprays into both nostrils daily. 16 g 0  . gabapentin (NEURONTIN) 300 MG capsule Take 1 capsule (300 mg total) by mouth at bedtime. 30 capsule 5  . glipiZIDE (GLUCOTROL XL) 10 MG 24 hr tablet Take 1 tablet (10 mg total) by mouth daily with breakfast. 30 tablet 5  . insulin detemir (LEVEMIR) 100 UNIT/ML FlexPen Inject 38 Units into the skin 2 (two) times daily. 15 mL  1  . Insulin Pen Needle (PEN NEEDLES) 32G X 4 MM MISC 1 each by Does not apply route 2 (two) times daily. 100 each 11  . naproxen (NAPROSYN) 500 MG tablet Take 1 tablet (500 mg total) by mouth 2 (two) times daily. 30 tablet 0  . sitaGLIPtin (JANUVIA) 100 MG tablet Take 1 tablet (100 mg total) by mouth daily. 30 tablet 5  . triamterene-hydrochlorothiazide (MAXZIDE-25) 37.5-25 MG tablet Take 1 tablet by mouth  daily. 30 tablet 5   No current facility-administered medications for this visit.    Allergies: is allergic to metformin and related, penicillins, and victoza [liraglutide].  Physical Exam:  BP (!) 147/91   Pulse 87   Ht 5\' 6"  (1.676 m)   Wt (!) 300 lb 8 oz (136.3 kg)   LMP 08/16/2020 (Approximate)   BMI 48.50 kg/m  Body mass index is 48.5 kg/m. General appearance: Well nourished, well developed female in no acute distress.  Neuro/Psych:  Normal mood and affect.    Pelvic exam:  EGBUS, vaginal vault and cervix: within normal limits   Radiology: Narrative & Impression  CLINICAL DATA:  Abnormal uterine bleeding, menorrhagia, fibroids, cyst follow-up  EXAM: TRANSABDOMINAL AND TRANSVAGINAL ULTRASOUND OF PELVIS  TECHNIQUE: Both transabdominal and transvaginal ultrasound examinations of the pelvis were performed. Transabdominal technique was performed for global imaging of the pelvis including uterus, ovaries, adnexal regions, and pelvic cul-de-sac. It was necessary to proceed with endovaginal exam following the transabdominal exam to visualize the uterus, endometrium and ovaries.  COMPARISON:  10/02/2016  FINDINGS: Uterus  Measurements: 12.3 x 6.5 x 7.9 cm. Heterogeneous appearance of myometrium. Poor definition of endometrial complex. Question anterior wall leiomyoma at upper uterine segment versus artifact, 2.5 x 2.5 x 2.8 cm. Fundal leiomyoma seen on the previous exam is inadequately visualized due to body habitus and poor acoustic window on transabdominal imaging. Anterior wall scarring from prior Caesarean section. No additional uterine mass lesion.  Endometrium  Thickness: 10 mm thick.  No endometrial fluid or focal abnormality  Right ovary  Not visualized on either transabdominal or endovaginal imaging suspect related to combination of body habitus and bowel gas  Left ovary  Not visualized on either transabdominal or endovaginal  imaging suspect related to combination of body habitus and bowel gas  Other findings  No free pelvic fluid.  No adnexal masses visualized.  IMPRESSION: Prior Caesarean section with anterior wall uterine scar.  Questionable small anterior wall leiomyoma versus artifact.  Heterogeneous myometrium nonspecific but could represent adenomyosis.  Nonvisualization of ovaries.   Electronically Signed   By: Lavonia Dana M.D.   On: 09/10/2017 15:24   Labs: CBC Latest Ref Rng & Units 01/25/2020 08/12/2018 04/12/2018  WBC 3.4 - 10.8 x10E3/uL 5.1 6.2 5.6  Hemoglobin 11.1 - 15.9 g/dL 11.1 11.9(L) 12.2  Hematocrit 34.0 - 46.6 % 33.8(L) 37.3 37.0  Platelets 150 - 450 x10E3/uL 482(H) 435(H) 519(H)   Assessment: pt stable  Plan:  1. Menorrhagia with regular cycle Long d/w pt and unfortunately there are no great options. She has tried Aygestin, Hospital doctor and she states that they never worked. 09/2017 u/s showed 12 x 7 x 8cm uterus with small fibroid at the fundus and normal ovaries. I told her that her options are not great and are limited by her co-moribidities. In terms of surgery, a hysterectomy would be the only real option; she was to have this done by Dr. Ihor Dow but her diabetes was too poorly controlled after her  ablation; her last a1c in Late October 2021 was 8.8, so I told her that before consideration can be made she'll need to have all her co-morbidities better controlled and to lose weight. In terms of medication, she could try OCPs and depo provera again, but patient does not want to try these options. Her CBC in mid 2021 was normal  Given this, I told her that as long as her bleeding is regular and no intermenstrual bleeding that she can do expectant management.   Patient   2. Dysmenorrhea  3. BMI 45.0-49.9, adult ([facility])   RTC: PRN  Durene Romans MD Attending Center for [facility])

## 2020-09-04 ENCOUNTER — Encounter: Payer: Self-pay | Admitting: Obstetrics and Gynecology

## 2020-09-04 DIAGNOSIS — Z6841 Body Mass Index (BMI) 40.0 and over, adult: Secondary | ICD-10-CM | POA: Insufficient documentation

## 2020-10-01 ENCOUNTER — Other Ambulatory Visit: Payer: Self-pay

## 2020-10-01 ENCOUNTER — Encounter (HOSPITAL_COMMUNITY): Payer: Self-pay | Admitting: Emergency Medicine

## 2020-10-01 ENCOUNTER — Ambulatory Visit (HOSPITAL_COMMUNITY)
Admission: EM | Admit: 2020-10-01 | Discharge: 2020-10-01 | Disposition: A | Discharge status: Home / Self Care | Payer: Medicaid Other | Attending: Student | Admitting: Student

## 2020-10-01 DIAGNOSIS — E119 Type 2 diabetes mellitus without complications: Secondary | ICD-10-CM | POA: Diagnosis not present

## 2020-10-01 DIAGNOSIS — Z794 Long term (current) use of insulin: Secondary | ICD-10-CM | POA: Diagnosis not present

## 2020-10-01 DIAGNOSIS — E118 Type 2 diabetes mellitus with unspecified complications: Secondary | ICD-10-CM | POA: Diagnosis not present

## 2020-10-01 DIAGNOSIS — A084 Viral intestinal infection, unspecified: Secondary | ICD-10-CM | POA: Diagnosis not present

## 2020-10-01 LAB — CBG MONITORING, ED: Glucose-Capillary: 148 mg/dL — ABNORMAL HIGH (ref 70–99)

## 2020-10-01 MED ORDER — ONDANSETRON 8 MG PO TBDP: 8.0000 mg | ORAL_TABLET | Freq: Three times a day (TID) | ORAL | 0 refills | Status: DC | PRN

## 2020-10-01 MED ORDER — LOPERAMIDE HCL 2 MG PO CAPS: 2.0000 mg | ORAL_CAPSULE | Freq: Four times a day (QID) | ORAL | 0 refills | Status: DC | PRN

## 2020-10-01 NOTE — Discharge Instructions (Addendum)
-  Take the Zofran (ondansetron) up to 3 times daily for nausea and vomiting. Dissolve one pill under your tongue or between your teeth and your cheek. -Take the Imodium (loperamide) up to 4 times daily for diarrhea. -For fevers/chills, body aches- Take Tylenol 1000 mg 3 times daily, and ibuprofen 800 mg 3 times daily with food.  You can take these together, or alternate every 3-4 hours. -Continue monitoring your sugars at home.  If these are lower or higher than normal, follow-up with your primary doctor. -Eat a bland diet and drink plenty of fluids as tolerated. -Seek additional immediate medical attention if you experience abdominal pain that is getting worse or in 1 small place on your stomach, fevers or chills that do not reduce with Tylenol, inability to keep fluids down despite medication, chest pain, worsening dizziness, etc. -You will be contagious still for about 24 hours after you stop having symptoms.

## 2020-10-01 NOTE — ED Provider Notes (Signed)
[facility]    CSN: [id number] Arrival date & time: 10/01/20  1519      History   Chief Complaint Chief Complaint  Patient presents with  . Abdominal Pain  . Emesis  . Dizziness  . Headache    HPI Ruth [name] is a 45 y.o. female presenting with stomach bug.  History anemia, arthritis, asthma, bipolar, fibromyalgia, migraines, hypertension, hypothyroid, sleep apnea, thrombocytosis, type 2 diabetes on insulin, fibromyalgia.  Notes headaches, dizziness, vomiting.  Describes dizziness as more of a lightheadedness with standing.  Notes 1 episode of vomiting in the last 12 hours.  No diarrhea.  Generalized crampy abdominal pain.  Denies fevers/chills,  shortness of breath, chest pain, cough, congestion, facial pain, teeth pain, , sore throat, loss of taste/smell, swollen lymph nodes, ear pain, urinary symptoms.  States diabetes is well controlled on current regimen, checking her sugars daily.   HPI  Past Medical History:  Diagnosis Date  . Anemia   . Arthritis    chronic back pain   . Asthma   . Bipolar 1 disorder ([place])   . Fibromyalgia   . Hx of migraines    occ  . Hypertension   . Hypothyroidism   . Obesity   . Sleep apnea   . Thrombocytosis 01/26/2020  . Type 2 diabetes mellitus ([place])    Type 2    Patient Active Problem List   Diagnosis Date Noted  . BMI 45.0-49.9, adult ([place]) 09/04/2020  . Right arm pain 02/14/2020  . Hyperlipidemia associated with type 2 diabetes mellitus ([place]) 09/19/2019  . Abnormal uterine bleeding (AUB) 12/07/2017  . Abnormal mammogram of both breasts 11/02/2016  . Hypothyroidism 06/21/2009  . Type 2 diabetes mellitus with peripheral neuropathy ([place]) 06/21/2009  . BIPOLAR AFFECTIVE DISORDER 06/21/2009  . 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 INADEQUATE SLEEP HYGIENE 06/21/2009  . OBSTRUCTIVE SLEEP APNEA 06/21/2009  . 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 CARPAL TUNNEL SYNDROME 06/20/2009  .  MERALGIA PARESTHETICA 06/20/2009  . Essential hypertension 06/20/2009  . FIBROMYALGIA 06/20/2009  . CEPHALGIA 06/20/2009    Past Surgical History:  Procedure Laterality Date  . CARPAL TUNNEL RELEASE Right   . CESAREAN SECTION     x2  . DILITATION & CURRETTAGE/HYSTROSCOPY WITH NOVASURE ABLATION N/A 12/07/2017   Procedure: DILATATION & CURETTAGE/HYSTEROSCOPY WITH ATTEMPED NOVASURE ABLATION, FAILED/ENDOMETERIAL ABLATION WITH HTA SYSTEM;  Surgeon: Lavonia Drafts, MD;  Location: [facility];  Service: Gynecology;  Laterality: N/A;  . ELBOW SURGERY     "surgery for nerve pain"  . GANGLION CYST EXCISION Right    right arm   . RECTOVAGINAL FISTULA CLOSURE     after vag delivery  . TONSILLECTOMY     childhood  . 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 Authorizing Provider  loperamide (IMODIUM) 2 MG capsule Take 1  capsule (2 mg total) by mouth 4 (four) times daily as needed for diarrhea or loose stools. 10/01/20  Yes Hazel Sams, PA-C  ondansetron (ZOFRAN ODT) 8 MG disintegrating tablet Take 1 tablet (8 mg total) by mouth every 8 (eight) hours as needed for nausea or vomiting. 10/01/20  Yes Hazel Sams, PA-C  Accu-Chek FastClix Lancets MISC E1 1.65, FOUR BLOOD GLUCOSE TESTING FASTING ANND TWO HOURS AFTER SUPPER 07/01/18   [provider]  ACCU-CHEK GUIDE test strip CHECK BLOOD SUGARS TWICE, E11.65 12/21/18   [provider]  albuterol (ACCUNEB) 0.63 MG/3ML nebulizer solution USE 1 VIAL VIA NEBULIZER EVERY 4-6 HOURS AS NEEDED 01/25/20   Nicolette Bang, DO  albuterol (VENTOLIN HFA) 108 (90  Base) MCG/ACT inhaler Inhale 1-2 puffs into the lungs every 6 (six) hours as needed for wheezing. 01/25/20   Nicolette Bang, DO  amLODipine (NORVASC) 5 MG tablet Take 1 tablet (5 mg total) by mouth daily. 04/25/20   Nicolette Bang, DO  atorvastatin (LIPITOR) 80 MG tablet Take 1 tablet (80 mg total) by mouth daily at 6 PM. 04/25/20   Nicolette Bang, DO  budesonide-formoterol [facility]) 160-4.5 MCG/ACT inhaler Inhale 2 puffs into the lungs 2 (two) times daily. 01/25/20   Nicolette Bang, DO  cetirizine (ZYRTEC) 10 MG tablet Take 1 tablet (10 mg total) by mouth daily. 01/25/20   Nicolette Bang, DO  cyclobenzaprine (FLEXERIL) 10 MG tablet Take 1 tablet (10 mg total) by mouth 3 (three) times daily as needed for muscle spasms. 08/29/20   Scot Jun, FNP  doxycycline (VIBRAMYCIN) 100 MG capsule Take 1 capsule (100 mg total) by mouth 2 (two) times daily. 08/29/20   Scot Jun, FNP  fluticasone (FLONASE) 50 MCG/ACT nasal spray Place 1-2 sprays into both nostrils daily. 05/31/20   Wieters, Hallie C, PA-C  gabapentin (NEURONTIN) 300 MG capsule Take 1 capsule (300 mg total) by mouth at bedtime. 04/25/20   Nicolette Bang, DO  glipiZIDE (GLUCOTROL XL) 10 MG 24 hr tablet Take 1 tablet (10 mg total) by mouth daily with breakfast. 04/25/20   Nicolette Bang, DO  insulin detemir (LEVEMIR) 100 UNIT/ML FlexPen Inject 38 Units into the skin 2 (two) times daily. 04/25/20   Nicolette Bang, DO  Insulin Pen Needle (PEN NEEDLES) 32G X 4 MM MISC 1 each by Does not apply route 2 (two) times daily. 01/25/20   Nicolette Bang, DO  naproxen (NAPROSYN) 500 MG tablet Take 1 tablet (500 mg total) by mouth 2 (two) times daily. 08/29/20   Scot Jun, FNP  sitaGLIPtin (JANUVIA) 100 MG tablet Take 1 tablet (100 mg total) by mouth daily. 04/25/20   Nicolette Bang, DO  triamterene-hydrochlorothiazide (MAXZIDE-25)  37.5-25 MG tablet Take 1 tablet by mouth daily. 04/25/20   Nicolette Bang, DO    Family History Family History  Problem Relation Age of Onset  . Thyroid disease Mother   . Diabetes Daughter   . Diabetes Son   . Breast cancer Neg Hx     Social History Social History   Tobacco Use  . Smoking status: Current Every Day Smoker    Packs/day: 0.25    Types: Cigarettes  . Smokeless tobacco: Never Used  Vaping Use  . Vaping Use: Never used  Substance Use Topics  . Alcohol use: Yes    Comment: occasionally  . Drug use: Yes    Types: Marijuana     Allergies  Metformin and related, Penicillins, and Victoza [liraglutide]   Review of Systems Review of Systems  Constitutional: Positive for fatigue. Negative for appetite change, chills, diaphoresis, fever and unexpected weight change.  HENT: Negative for congestion, ear pain, sinus pressure, sinus pain, sneezing, sore throat and trouble swallowing.   Respiratory: Negative for cough, chest tightness and shortness of breath.   Cardiovascular: Negative for chest pain.  Gastrointestinal: Positive for nausea and vomiting. Negative for abdominal distention, abdominal pain, anal bleeding, blood in stool, constipation, diarrhea and rectal pain.  Genitourinary: Negative for dysuria, flank pain, frequency and urgency.  Musculoskeletal: Negative for back pain and myalgias.  Neurological: Negative for dizziness, light-headedness and headaches.  All other systems reviewed and are negative.    Physical Exam Triage Vital Signs ED Triage Vitals  Enc Vitals Group     BP 10/01/20 1558 (!) 140/94     Pulse Rate 10/01/20 1558 100     Resp 10/01/20 1558 18     Temp 10/01/20 1558 98.9 F (37.2 C)     Temp Source 10/01/20 1558 Oral     SpO2 10/01/20 1558 96 %     Weight --      Height --      Head Circumference --      Peak Flow --      Pain Score 10/01/20 1555 8     Pain Loc --      Pain Edu? --      Excl. in [place]? --    No  data found.  Updated Vital Signs BP (!) 140/94 (BP Location: Right Arm)   Pulse 100   Temp 98.9 F (37.2 C) (Oral)   Resp 18   LMP 09/10/2020   SpO2 96%   Visual Acuity Right Eye Distance:   Left Eye Distance:   Bilateral Distance:    Right Eye Near:   Left Eye Near:    Bilateral Near:     Physical Exam Vitals reviewed.  Constitutional:      General: She is not in acute distress.    Appearance: Normal appearance. She is not ill-appearing.  HENT:     Head: Normocephalic and atraumatic.     Mouth/Throat:     Mouth: Mucous membranes are moist.     Comments: Moist mucous membranes Eyes:     Extraocular Movements: Extraocular movements intact.     Pupils: Pupils are equal, round, and reactive to light.  Cardiovascular:     Rate and Rhythm: Normal rate and regular rhythm.     Heart sounds: Normal heart sounds.  Pulmonary:     Effort: Pulmonary effort is normal.     Breath sounds: Normal breath sounds. No wheezing, rhonchi or rales.  Abdominal:     General: Bowel sounds are normal. There is no distension.     Palpations: Abdomen is soft. There is no mass.     Tenderness: There is generalized abdominal tenderness. There is no right CVA tenderness, left CVA tenderness, guarding or rebound. Negative signs include Murphy's sign, Rovsing's sign and McBurney's sign.  Skin:    General: Skin is warm.     Capillary Refill: Capillary refill takes less than 2 seconds.     Comments: Good skin turgor  Neurological:     General: No focal deficit present.     Mental Status: She is alert and oriented to person, place, and time.  Psychiatric:        Mood and Affect: Mood normal.  Behavior: Behavior normal.      UC Treatments / Results  Labs (all labs ordered are listed, but only abnormal results are displayed) Labs Reviewed - No data to display  EKG   Radiology No results found.  Procedures Procedures (including critical care time)  Medications Ordered in  UC Medications - No data to display  Initial Impression / Assessment and Plan / UC Course  I have reviewed the triage vital signs and the nursing notes.  Pertinent labs & imaging results that were available during my care of the patient were reviewed by me and considered in my medical decision making (see chart for details).     This patient is a 45 year old female presenting with viral gastroenteritis. Today this pt is afebrile nontachycardic nontachypneic, oxygenating well on room air, no wheezes rhonchi or rales.  Appears well-hydrated.  POC nonfasting CBG 148. Type 2 diabetes currently well controlled on insulin, continue this. Continue monitoring sugars daily.   Zofran and Imodium sent as below.  Work note provided.  Good hydration, brat diet. Return precautions discussed.  This chart was dictated using voice recognition software, Dragon. Despite the best efforts of this provider to proofread and correct errors, errors may still occur which can change documentation meaning.   Final Clinical Impressions(s) / UC Diagnoses   Final diagnoses:  Viral gastroenteritis  Type 2 diabetes mellitus without complication, with long-term current use of insulin ([place])     Discharge Instructions     -Take the Zofran (ondansetron) up to 3 times daily for nausea and vomiting. Dissolve one pill under your tongue or between your teeth and your cheek. -Take the Imodium (loperamide) up to 4 times daily for diarrhea. -For fevers/chills, body aches- Take Tylenol 1000 mg 3 times daily, and ibuprofen 800 mg 3 times daily with food.  You can take these together, or alternate every 3-4 hours. -Continue monitoring your sugars at home.  If these are lower or higher than normal, follow-up with your primary doctor. -Eat a bland diet and drink plenty of fluids as tolerated. -Seek additional immediate medical attention if you experience abdominal pain that is getting worse or in 1 small place on your stomach,  fevers or chills that do not reduce with Tylenol, inability to keep fluids down despite medication, chest pain, worsening dizziness, etc. -You will be contagious still for about 24 hours after you stop having symptoms.    ED Prescriptions    Medication Sig Dispense Auth. Provider   ondansetron (ZOFRAN ODT) 8 MG disintegrating tablet Take 1 tablet (8 mg total) by mouth every 8 (eight) hours as needed for nausea or vomiting. 20 tablet Hazel Sams, PA-C   loperamide (IMODIUM) 2 MG capsule Take 1 capsule (2 mg total) by mouth 4 (four) times daily as needed for diarrhea or loose stools. 12 capsule Hazel Sams, PA-C     PDMP not reviewed this encounter.   Hazel Sams, PA-C 10/01/20 1725

## 2020-10-01 NOTE — ED Triage Notes (Signed)
Pt presents with headache, dizziness, nausea and vomiting that started this am.

## 2020-10-10 ENCOUNTER — Other Ambulatory Visit: Payer: Self-pay | Admitting: Family Medicine

## 2020-10-10 ENCOUNTER — Other Ambulatory Visit: Payer: Self-pay

## 2020-10-10 ENCOUNTER — Ambulatory Visit (HOSPITAL_COMMUNITY)
Admission: EM | Admit: 2020-10-10 | Discharge: 2020-10-10 | Disposition: A | Discharge status: Home / Self Care | Payer: Medicaid Other | Attending: Family | Admitting: Family

## 2020-10-10 ENCOUNTER — Encounter (HOSPITAL_COMMUNITY): Payer: Self-pay

## 2020-10-10 DIAGNOSIS — M25511 Pain in right shoulder: Secondary | ICD-10-CM

## 2020-10-10 DIAGNOSIS — R202 Paresthesia of skin: Secondary | ICD-10-CM | POA: Diagnosis not present

## 2020-10-10 DIAGNOSIS — M79601 Pain in right arm: Secondary | ICD-10-CM

## 2020-10-10 MED ORDER — METHYLPREDNISOLONE SODIUM SUCC 125 MG IJ SOLR
INTRAMUSCULAR | Status: AC
  Filled 2020-10-10: qty 2

## 2020-10-10 MED ORDER — METHYLPREDNISOLONE SODIUM SUCC 125 MG IJ SOLR
125.0000 mg | Freq: Once | INTRAMUSCULAR | Status: AC
  Administered 2020-10-10: 125 mg via INTRAMUSCULAR

## 2020-10-10 MED ORDER — PREDNISONE 10 MG (21) PO TBPK: ORAL_TABLET | Freq: Every day | ORAL | 0 refills | Status: DC

## 2020-10-10 NOTE — Discharge Instructions (Addendum)
You were given a steroid shot today (SoluMedrol) to help with pain and inflammation in your right shoulder and arm. Recommend start oral Prednisone 10mg  tablets tomorrow- take 6 tablets for 2 days then decrease by 1 tablet every 2 days until finished- take with food. Continue to monitor blood sugars and your blood pressure since steroids can raise both. Hold Naproxen while on Prednisone. Recommend call your PCP today to schedule appointment for recheck for next week. If pain or numbness does not change or gets worse over the next 48 hours, go to the ER ASAP. Otherwise, follow-up with your PCP as planned.

## 2020-10-10 NOTE — ED Provider Notes (Signed)
[place]    CSN: [id number] Arrival date & time: 10/10/20  1216      History   Chief Complaint Chief Complaint  Patient presents with  . Right Arm Pain & Numbness    HPI Nyoka Alcoser is a 45 y.o. female.   45 year old female presents with right sided neck, shoulder, arm and hand pain that has been off and on for the past 3 to 4 days. Also experiencing some numbness- particularly in her right fingers. Pain is throbbing and numbness varies with position. Any pressure or movement of her right shoulder causes more pain down her arm. Lowering her arm increases the numbness so she is holding her arm across her chest and rubbing her fingers. No distinct injury but has had carpal tunnel syndrome on right side with surgical release, right elbow surgery and ganglion cyst removal from right wrist. Has been taking Tylenol, Naproxen, Flexeril and Neurontin with no relief. 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 Current tobacco smoker. Currently on Norvasc, Maxzide, Lipitor, Glipizide, Levemir insulin, Sitagliptin, Zyrtec, Flonase, Symbicort daily and Albuterol prn.   The history is provided by the patient.    Past Medical History:  Diagnosis Date  . Anemia   . Arthritis    chronic back pain   . Asthma   . Bipolar 1 disorder ([place])   . Fibromyalgia   . Hx of migraines    occ  . Hypertension   . Hypothyroidism   . Obesity   . Sleep apnea   . Thrombocytosis 01/26/2020  . Type 2 diabetes mellitus ([place])    Type 2    Patient Active Problem List   Diagnosis Date Noted  . BMI 45.0-49.9, adult ([place]) 09/04/2020  . Right arm pain 02/14/2020  . Hyperlipidemia associated with type 2 diabetes mellitus ([place]) 09/19/2019  . Abnormal uterine bleeding (AUB) 12/07/2017  . Abnormal mammogram of both breasts 11/02/2016  .  Hypothyroidism 06/21/2009  . Type 2 diabetes mellitus with peripheral neuropathy ([place]) 06/21/2009  . BIPOLAR AFFECTIVE DISORDER 06/21/2009  . 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 INADEQUATE SLEEP HYGIENE 06/21/2009  . OBSTRUCTIVE SLEEP APNEA 06/21/2009  . 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 CARPAL TUNNEL SYNDROME 06/20/2009  . MERALGIA PARESTHETICA 06/20/2009  . Essential hypertension 06/20/2009  . FIBROMYALGIA 06/20/2009  . CEPHALGIA 06/20/2009    Past Surgical History:  Procedure Laterality Date  . CARPAL TUNNEL RELEASE Right   . CESAREAN SECTION     x2  . DILITATION & CURRETTAGE/HYSTROSCOPY WITH NOVASURE ABLATION N/A 12/07/2017   Procedure: DILATATION & CURETTAGE/HYSTEROSCOPY WITH ATTEMPED NOVASURE ABLATION, FAILED/ENDOMETERIAL ABLATION WITH HTA SYSTEM;  Surgeon: Lavonia Drafts, MD;  Location: [facility];  Service: Gynecology;  Laterality: N/A;  . ELBOW SURGERY     "surgery for nerve pain"  . GANGLION CYST EXCISION Right    right arm   . RECTOVAGINAL FISTULA CLOSURE     after vag delivery  . TONSILLECTOMY     childhood  . 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 Authorizing Provider  predniSONE (STERAPRED UNI-PAK 21 TAB) 10 MG (21) TBPK tablet Take by mouth daily. Take 6 tabs by mouth daily for 2 days, then 5 tabs for 2 days, then 4 tabs for 2 days, then 3 tabs for 2 days, 2 tabs for 2 days, then 1 tab by mouth daily for 2 days 10/10/20  Yes Danayah Smyre, Nicholes Stairs, NP  Accu-Chek FastClix Lancets MISC E1 1.65, FOUR BLOOD GLUCOSE TESTING FASTING ANND TWO HOURS AFTER SUPPER 07/01/18    [provider]  ACCU-CHEK GUIDE test strip CHECK BLOOD SUGARS TWICE, E11.65 12/21/18   [provider]  albuterol (ACCUNEB) 0.63 MG/3ML nebulizer solution USE 1 VIAL VIA NEBULIZER EVERY 4-6 HOURS AS NEEDED 01/25/20   Nicolette Bang, DO  albuterol (VENTOLIN HFA) 108 (90 Base) MCG/ACT inhaler Inhale 1-2 puffs into the lungs every 6 (six) hours as needed for wheezing. 01/25/20   Nicolette Bang, DO  amLODipine (NORVASC) 5 MG tablet Take 1 tablet (5 mg total) by mouth daily. 04/25/20   Nicolette Bang, DO  atorvastatin (LIPITOR) 80 MG tablet Take 1 tablet (80 mg total) by mouth daily at 6 PM. 04/25/20   Nicolette Bang, DO  budesonide-formoterol [facility]) 160-4.5 MCG/ACT inhaler Inhale 2 puffs into the lungs 2 (two) times daily. 01/25/20   Nicolette Bang, DO  cetirizine (ZYRTEC) 10 MG tablet Take 1 tablet (10 mg total) by mouth daily. 01/25/20   Nicolette Bang, DO  cyclobenzaprine (FLEXERIL) 10 MG tablet Take 1 tablet (10 mg total) by mouth 3 (three) times daily as needed for muscle spasms. 08/29/20   Scot Jun, FNP  fluticasone (FLONASE) 50 MCG/ACT nasal spray Place 1-2 sprays into both nostrils daily. 05/31/20   Wieters, Hallie C, PA-C  gabapentin (NEURONTIN) 300 MG capsule Take 1 capsule (300 mg total) by mouth at bedtime. 04/25/20   Nicolette Bang, DO  glipiZIDE (GLUCOTROL XL) 10 MG 24 hr tablet Take 1 tablet (10 mg total) by mouth daily with breakfast. 04/25/20   Nicolette Bang, DO  insulin detemir (LEVEMIR) 100 UNIT/ML FlexPen Inject 38 Units into the skin 2 (two) times daily. 04/25/20   Nicolette Bang, DO  Insulin Pen Needle (PEN NEEDLES) 32G X 4 MM MISC 1 each by Does not apply route 2 (two) times daily. 01/25/20   Nicolette Bang, DO  naproxen (NAPROSYN) 500 MG tablet Take 1 tablet (500 mg total) by mouth 2 (two) times daily. 08/29/20   Scot Jun, FNP   ondansetron (ZOFRAN ODT) 8 MG disintegrating tablet Take 1 tablet (8 mg total) by mouth every 8 (eight) hours as needed for nausea or vomiting. 10/01/20   Hazel Sams, PA-C  sitaGLIPtin (JANUVIA) 100 MG tablet Take 1 tablet (100 mg total) by mouth daily. 04/25/20   Nicolette Bang, DO  triamterene-hydrochlorothiazide (MAXZIDE-25) 37.5-25 MG tablet Take 1 tablet by mouth daily. 04/25/20   Nicolette Bang, DO    Family History Family History  Problem Relation Age of Onset  . Thyroid disease Mother   . Diabetes Daughter   . Diabetes Son   . Breast cancer Neg Hx     Social History Social History  Tobacco Use  . Smoking status: Current Every Day Smoker    Packs/day: 0.25    Types: Cigarettes  . Smokeless tobacco: Never Used  Vaping Use  . Vaping Use: Never used  Substance Use Topics  . Alcohol use: Yes    Comment: occasionally  . Drug use: Yes    Types: Marijuana     Allergies   Metformin and related, Penicillins, and Victoza [liraglutide]   Review of Systems Review of Systems  Constitutional: Positive for fatigue. Negative for appetite change, chills, diaphoresis and fever.  HENT: Negative for sore throat and trouble swallowing.   Respiratory: Negative for cough, chest tightness and shortness of breath.   Cardiovascular: Negative for chest pain.  Gastrointestinal: Negative for nausea and vomiting.  Musculoskeletal: Positive for arthralgias, back pain, myalgias and neck pain. Negative for neck stiffness.  Skin: Negative for color change and rash.  Allergic/Immunologic: Positive for environmental allergies. Negative for food allergies.  Neurological: Positive for numbness and headaches. Negative for dizziness, tremors, seizures, syncope, facial asymmetry, speech difficulty and light-headedness.  Hematological: Negative for adenopathy. Bruises/bleeds easily.  Psychiatric/Behavioral: Positive for sleep disturbance.     Physical Exam Triage Vital  Signs ED Triage Vitals  Enc Vitals Group     BP 10/10/20 1232 (!) 134/94     Pulse Rate 10/10/20 1232 94     Resp 10/10/20 1232 17     Temp 10/10/20 1232 98.3 F (36.8 C)     Temp Source 10/10/20 1232 Oral     SpO2 10/10/20 1232 100 %     Weight --      Height --      Head Circumference --      Peak Flow --      Pain Score 10/10/20 1231 7     Pain Loc --      Pain Edu? --      Excl. in [place]? --    No data found.  Updated Vital Signs BP (!) 134/94 (BP Location: Left Arm)   Pulse 94   Temp 98.3 F (36.8 C) (Oral)   Resp 17   LMP 09/10/2020   SpO2 100%   Visual Acuity Right Eye Distance:   Left Eye Distance:   Bilateral Distance:    Right Eye Near:   Left Eye Near:    Bilateral Near:     Physical Exam Vitals and nursing note reviewed.  Constitutional:      General: She is awake. She is not in acute distress.    Appearance: She is well-developed and well-groomed.     Comments: She is sitting on the exam table in no acute distress but she is holding her right arm against her chest with her left hand and continues to massage her right fingers.   HENT:     Head: Normocephalic and atraumatic.     Right Ear: Hearing normal.     Left Ear: Hearing normal.  Neck:   Cardiovascular:     Rate and Rhythm: Normal rate and regular rhythm.     Heart sounds: Normal heart sounds. No murmur heard.   Pulmonary:     Effort: Pulmonary effort is normal. No respiratory distress.     Breath sounds: Normal breath sounds and air entry. No decreased air movement. No decreased breath sounds, wheezing or rhonchi.  Musculoskeletal:        General: Swelling and tenderness present. No signs of injury.     Right shoulder: Swelling and tenderness present. No effusion.  Decreased range of motion. Normal strength. Normal pulse.     Left shoulder: Normal.     Right hand: Swelling present. No tenderness. Normal range of motion. Normal strength. There is no disruption of two-point discrimination.  Normal capillary refill. Normal pulse.     Left hand: Normal.       Arms:     Cervical back: Neck supple. No erythema or rigidity. Pain with movement and muscular tenderness present. Decreased range of motion.     Comments: Has slightly decreased range of motion of neck and right shoulder, mainly with rotation. Slight swelling and very tender along right trapezius muscle group. No redness or rash. Muscle spasms present. Patient has full range of motion of right elbow, hand and fingers. Good distal pulses and capillary refill. Good sensation in fingers but subjective numbness.   Lymphadenopathy:     Cervical: No cervical adenopathy.  Skin:    General: Skin is warm and dry.     Capillary Refill: Capillary refill takes less than 2 seconds.     Coloration: Skin is not cyanotic, jaundiced or pale.     Findings: No bruising, ecchymosis, erythema, petechiae or rash.  Neurological:     General: No focal deficit present.     Mental Status: She is alert and oriented to person, place, and time.     Sensory: Sensation is intact.     Motor: Motor function is intact.  Psychiatric:        Mood and Affect: Mood normal.        Behavior: Behavior normal. Behavior is cooperative.        Thought Content: Thought content normal.        Judgment: Judgment normal.      UC Treatments / Results  Labs (all labs ordered are listed, but only abnormal results are displayed) Labs Reviewed - No data to display  EKG   Radiology No results found.  Procedures Procedures (including critical care time)  Medications Ordered in UC Medications  methylPREDNISolone sodium succinate (SOLU-MEDROL) 125 mg/2 mL injection 125 mg (125 mg Intramuscular Given 10/10/20 1405)    Initial Impression / Assessment and Plan / UC Course  I have reviewed the triage vital signs and the nursing notes.  Pertinent labs & imaging results that were available during my care of the patient were reviewed by me and considered in my  medical decision making (see chart for details).    Reviewed with patient that she is experiencing trapezius muscle spasms and pain with resulting radiation of pain and nerve involvement. Uncertain if due to cervical neck issue, fibromyalgia flare up, diabetic peripheral neuropathy or other etiology. Discussed that since she is already on NSAID (Naproxen), Flexeril, and Neurontin, limited choices for other pain and inflammation management. Patient has taken oral Prednisone before and discussed possible increase in sugar level and blood pressure with medication. Gave Solumedrol 125mg  IM now to help with pain and inflammation. May continue Flexeril and Neurontin as prescribed. Hold Naproxen for now. Recommend Prednisone 10mg  12 day dose pack starting tomorrow. Continue to monitor symptoms. May need MRI or other imaging. Recommend call her PCP today to schedule appointment for next week (3 to 4 days) for recheck. If pain or numbness does not change or gets worse over the next 48 hours, go to the ER ASAP. Otherwise, follow-up with your PCP as planned.  Final Clinical Impressions(s) / UC Diagnoses   Final diagnoses:  Acute pain of right shoulder  Arm pain, diffuse, right  Right hand paresthesia     Discharge Instructions     You were given a steroid shot today (SoluMedrol) to help with pain and inflammation in your right shoulder and arm. Recommend start oral Prednisone 10mg  tablets tomorrow- take 6 tablets for 2 days then decrease by 1 tablet every 2 days until finished- take with food. Continue to monitor blood sugars and your blood pressure since steroids can raise both. Hold Naproxen while on Prednisone. Recommend call your PCP today to schedule appointment for recheck for next week. If pain or numbness does not change or gets worse over the next 48 hours, go to the ER ASAP. Otherwise, follow-up with your PCP as planned.     ED Prescriptions    Medication Sig Dispense Auth. Provider   predniSONE  (STERAPRED UNI-PAK 21 TAB) 10 MG (21) TBPK tablet Take by mouth daily. Take 6 tabs by mouth daily for 2 days, then 5 tabs for 2 days, then 4 tabs for 2 days, then 3 tabs for 2 days, 2 tabs for 2 days, then 1 tab by mouth daily for 2 days 42 tablet Lonzo Saulter, Nicholes Stairs, NP     PDMP not reviewed this encounter.   Katy Apo, NP 10/11/20 1140

## 2020-10-10 NOTE — ED Triage Notes (Signed)
Pt presents with intermittent right hand & arm pain and numbness since the start of the week.

## 2020-10-15 DIAGNOSIS — Z03818 Encounter for observation for suspected exposure to other biological agents ruled out: Secondary | ICD-10-CM | POA: Diagnosis not present

## 2020-10-16 ENCOUNTER — Other Ambulatory Visit: Payer: Self-pay

## 2020-10-16 ENCOUNTER — Encounter (HOSPITAL_COMMUNITY): Payer: Self-pay | Admitting: Emergency Medicine

## 2020-10-16 ENCOUNTER — Emergency Department (HOSPITAL_COMMUNITY): Payer: Medicaid Other

## 2020-10-16 ENCOUNTER — Emergency Department (HOSPITAL_COMMUNITY)
Admission: EM | Admit: 2020-10-16 | Discharge: 2020-10-17 | Disposition: A | Discharge status: Home / Self Care | Payer: Medicaid Other | Attending: Emergency Medicine | Admitting: Emergency Medicine

## 2020-10-16 DIAGNOSIS — E039 Hypothyroidism, unspecified: Secondary | ICD-10-CM | POA: Insufficient documentation

## 2020-10-16 DIAGNOSIS — Z79899 Other long term (current) drug therapy: Secondary | ICD-10-CM | POA: Insufficient documentation

## 2020-10-16 DIAGNOSIS — Z7984 Long term (current) use of oral hypoglycemic drugs: Secondary | ICD-10-CM | POA: Insufficient documentation

## 2020-10-16 DIAGNOSIS — Z794 Long term (current) use of insulin: Secondary | ICD-10-CM | POA: Insufficient documentation

## 2020-10-16 DIAGNOSIS — E876 Hypokalemia: Secondary | ICD-10-CM | POA: Insufficient documentation

## 2020-10-16 DIAGNOSIS — R519 Headache, unspecified: Secondary | ICD-10-CM | POA: Diagnosis not present

## 2020-10-16 DIAGNOSIS — Z7951 Long term (current) use of inhaled steroids: Secondary | ICD-10-CM | POA: Diagnosis not present

## 2020-10-16 DIAGNOSIS — R202 Paresthesia of skin: Secondary | ICD-10-CM | POA: Diagnosis not present

## 2020-10-16 DIAGNOSIS — I10 Essential (primary) hypertension: Secondary | ICD-10-CM | POA: Insufficient documentation

## 2020-10-16 DIAGNOSIS — J45909 Unspecified asthma, uncomplicated: Secondary | ICD-10-CM | POA: Insufficient documentation

## 2020-10-16 DIAGNOSIS — R1110 Vomiting, unspecified: Secondary | ICD-10-CM | POA: Diagnosis not present

## 2020-10-16 DIAGNOSIS — H538 Other visual disturbances: Secondary | ICD-10-CM | POA: Insufficient documentation

## 2020-10-16 DIAGNOSIS — E1140 Type 2 diabetes mellitus with diabetic neuropathy, unspecified: Secondary | ICD-10-CM | POA: Insufficient documentation

## 2020-10-16 DIAGNOSIS — F17210 Nicotine dependence, cigarettes, uncomplicated: Secondary | ICD-10-CM | POA: Diagnosis not present

## 2020-10-16 LAB — CBC WITH DIFFERENTIAL/PLATELET
Abs Immature Granulocytes: 0.04 10*3/uL (ref 0.00–0.07)
Basophils Absolute: 0 10*3/uL (ref 0.0–0.1)
Basophils Relative: 0 %
Eosinophils Absolute: 0.1 10*3/uL (ref 0.0–0.5)
Eosinophils Relative: 1 %
HCT: 35.4 % — ABNORMAL LOW (ref 36.0–46.0)
Hemoglobin: 11.1 g/dL — ABNORMAL LOW (ref 12.0–15.0)
Immature Granulocytes: 0 %
Lymphocytes Relative: 34 %
Lymphs Abs: 3.1 10*3/uL (ref 0.7–4.0)
MCH: 25.1 pg — ABNORMAL LOW (ref 26.0–34.0)
MCHC: 31.4 g/dL (ref 30.0–36.0)
MCV: 80.1 fL (ref 80.0–100.0)
Monocytes Absolute: 0.3 10*3/uL (ref 0.1–1.0)
Monocytes Relative: 4 %
Neutro Abs: 5.5 10*3/uL (ref 1.7–7.7)
Neutrophils Relative %: 61 %
Platelets: 479 10*3/uL — ABNORMAL HIGH (ref 150–400)
RBC: 4.42 MIL/uL (ref 3.87–5.11)
RDW: 15.6 % — ABNORMAL HIGH (ref 11.5–15.5)
WBC: 9.1 10*3/uL (ref 4.0–10.5)
nRBC: 0 % (ref 0.0–0.2)

## 2020-10-16 LAB — BASIC METABOLIC PANEL
Anion gap: 10 (ref 5–15)
BUN: 7 mg/dL (ref 6–20)
CO2: 25 mmol/L (ref 22–32)
Calcium: 8.6 mg/dL — ABNORMAL LOW (ref 8.9–10.3)
Chloride: 97 mmol/L — ABNORMAL LOW (ref 98–111)
Creatinine, Ser: 0.65 mg/dL (ref 0.44–1.00)
GFR, Estimated: >60 mL/min (ref >60)
Glucose, Bld: 207 mg/dL — ABNORMAL HIGH (ref 70–99)
Potassium: 3 mmol/L — ABNORMAL LOW (ref 3.5–5.1)
Sodium: 132 mmol/L — ABNORMAL LOW (ref 135–145)

## 2020-10-16 LAB — I-STAT BETA HCG BLOOD, ED (MC, WL, AP ONLY): I-stat hCG, quantitative: <5 m[IU]/mL (ref <5)

## 2020-10-16 MED ORDER — SODIUM CHLORIDE 0.9 % IV BOLUS
500.0000 mL | Freq: Once | INTRAVENOUS | Status: AC
  Administered 2020-10-17: 500 mL via INTRAVENOUS

## 2020-10-16 MED ORDER — CYCLOBENZAPRINE HCL 10 MG PO TABS
10.0000 mg | ORAL_TABLET | Freq: Once | ORAL | Status: AC
  Administered 2020-10-17: 10 mg via ORAL
  Filled 2020-10-16: qty 1

## 2020-10-16 MED ORDER — METOCLOPRAMIDE HCL 5 MG/ML IJ SOLN
10.0000 mg | Freq: Once | INTRAMUSCULAR | Status: AC
  Administered 2020-10-17: 10 mg via INTRAVENOUS
  Filled 2020-10-16: qty 2

## 2020-10-16 MED ORDER — DIPHENHYDRAMINE HCL 50 MG/ML IJ SOLN
25.0000 mg | Freq: Once | INTRAMUSCULAR | Status: AC
  Administered 2020-10-17: 25 mg via INTRAVENOUS
  Filled 2020-10-16: qty 1

## 2020-10-16 MED ORDER — POTASSIUM CHLORIDE CRYS ER 20 MEQ PO TBCR
40.0000 meq | EXTENDED_RELEASE_TABLET | Freq: Once | ORAL | Status: AC
  Administered 2020-10-17: 40 meq via ORAL
  Filled 2020-10-16: qty 2

## 2020-10-16 MED ORDER — ONDANSETRON HCL 4 MG/2ML IJ SOLN
4.0000 mg | Freq: Once | INTRAMUSCULAR | Status: AC
  Administered 2020-10-17: 4 mg via INTRAVENOUS
  Filled 2020-10-16: qty 2

## 2020-10-16 NOTE — ED Provider Notes (Signed)
[facility] EMERGENCY DEPARTMENT Provider Note   CSN: [id number] Arrival date & time: 10/16/20  2032     History Chief Complaint  Patient presents with  . Blurred Vision/Head Pressure    Ruth [name] is a 45 y.o. female.  HPI   Patient with significant medical history of chronic back pain, bipolar, fibromyalgia, hypertension, obesity, type 2 diabetes presents with chief complaint of visual changes.  She endorses this started approximately 1 weeks ago, describes her vision as going blurry with farsightedness, has no problem with her nearsighted vision, it only comes on when she has a headache and it resolves after the headache stops.  Patient denies  recent trauma to her eyes, no history of eye issues in the past.  Patient describes her headache as a throbbing sensation on the right side of her head, associated with photophobia increase sensitivity to noise, has nausea without vomiting. She also has right-sided shoulder pain and numbness on her right arm.  States this all resolves after headache stops.  Patient has been taking Tylenol which provides her with relief for couple hours and then her headache starts up again.  She  never been diagnosed with a migraine the past, denies systemic infection like fever chills, denies IV drug use, denies recent head trauma, is not on anticoagulant.  Patient denies fevers, chills, chest pain, shortness breath, abdominal pain, vomiting, diarrhea, worsening pedal edema.  Past Medical History:  Diagnosis Date  . Anemia   . Arthritis    chronic back pain   . Asthma   . Bipolar 1 disorder ([place])   . Fibromyalgia   . Hx of migraines    occ  . Hypertension   . Hypothyroidism   . Obesity   . Sleep apnea   . Thrombocytosis 01/26/2020  . Type 2 diabetes mellitus ([place])    Type 2    Patient Active Problem List   Diagnosis Date Noted  . BMI 45.0-49.9, adult ([place]) 09/04/2020  . Right arm pain 02/14/2020  . Hyperlipidemia  associated with type 2 diabetes mellitus ([place]) 09/19/2019  . Abnormal uterine bleeding (AUB) 12/07/2017  . Abnormal mammogram of both breasts 11/02/2016  . Hypothyroidism 06/21/2009  . Type 2 diabetes mellitus with peripheral neuropathy ([place]) 06/21/2009  . BIPOLAR AFFECTIVE DISORDER 06/21/2009  . 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 INADEQUATE SLEEP HYGIENE 06/21/2009  . OBSTRUCTIVE SLEEP APNEA 06/21/2009  . 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 CARPAL TUNNEL SYNDROME 06/20/2009  . MERALGIA PARESTHETICA 06/20/2009  . Essential hypertension 06/20/2009  . FIBROMYALGIA 06/20/2009  . CEPHALGIA 06/20/2009    Past Surgical History:  Procedure Laterality Date  . CARPAL TUNNEL RELEASE Right   . CESAREAN SECTION     x2  . DILITATION & CURRETTAGE/HYSTROSCOPY WITH NOVASURE ABLATION N/A 12/07/2017   Procedure: DILATATION & CURETTAGE/HYSTEROSCOPY WITH ATTEMPED NOVASURE ABLATION, FAILED/ENDOMETERIAL ABLATION WITH HTA SYSTEM;  Surgeon: Lavonia Drafts, MD;  Location: [facility];  Service: Gynecology;  Laterality: N/A;  . ELBOW SURGERY     "surgery for nerve pain"  . GANGLION CYST EXCISION Right    right arm   . RECTOVAGINAL FISTULA CLOSURE     after vag delivery  . TONSILLECTOMY     childhood  . TUBAL LIGATION       OB History    Gravida  7   Para  5   Term  4   Preterm  1   AB  2   Living  5     SAB  2   IAB      Ectopic      Multiple      Live Births  5        Obstetric Comments  Vag x 2>c/s emergency>vbac>c/s and BTL H/o RV fistula and then repaired after vag delivery        Family History  Problem Relation Age of Onset  . Thyroid disease Mother   . Diabetes Daughter   . Diabetes Son   . Breast cancer Neg Hx     Social History   Tobacco Use  . Smoking status: Current Every Day Smoker    Packs/day: 0.25    Types: Cigarettes  . Smokeless tobacco: Never Used  Vaping Use  . Vaping Use: Never used  Substance Use Topics  .  Alcohol use: Yes    Comment: occasionally  . Drug use: Yes    Types: Marijuana    Home Medications Prior to Admission medications   Medication Sig Start Date End Date Taking? Authorizing Provider  Accu-Chek FastClix Lancets MISC E1 1.65, FOUR BLOOD GLUCOSE TESTING FASTING ANND TWO HOURS AFTER SUPPER 07/01/18  Yes [provider]  ACCU-CHEK GUIDE test strip CHECK BLOOD SUGARS TWICE, E11.65 12/21/18  Yes [provider]  albuterol (ACCUNEB) 0.63 MG/3ML nebulizer solution USE 1 VIAL VIA NEBULIZER EVERY 4-6 HOURS AS NEEDED Patient taking differently: Take 1 ampule by nebulization every 4 (four) hours as needed for shortness of breath or wheezing. 01/25/20  Yes Nicolette Bang, DO  albuterol (VENTOLIN HFA) 108 (90 Base) MCG/ACT inhaler Inhale 1-2 puffs into the lungs every 6 (six) hours as needed for wheezing. 01/25/20  Yes Nicolette Bang, DO  amLODipine (NORVASC) 5 MG tablet Take 1 tablet (5 mg total) by mouth daily. 04/25/20  Yes Nicolette Bang, DO  atorvastatin (LIPITOR) 80 MG tablet Take 1 tablet (80 mg total) by mouth daily at 6 PM. 04/25/20  Yes Nicolette Bang, DO  budesonide-formoterol [facility]) 160-4.5 MCG/ACT inhaler Inhale 2 puffs into the lungs 2 (two) times daily. 01/25/20  Yes Nicolette Bang, DO  cetirizine (ZYRTEC) 10 MG tablet Take 1 tablet (10 mg total) by mouth daily. Patient taking differently: Take 10 mg by mouth daily as needed for allergies. 01/25/20  Yes Nicolette Bang, DO  cyclobenzaprine (FLEXERIL) 10 MG tablet Take 1 tablet (10 mg total) by mouth 3 (three) times daily as needed for muscle spasms. 08/29/20  Yes Scot Jun, FNP  fluticasone (FLONASE) 50 MCG/ACT nasal spray Place 1-2 sprays into both nostrils daily. Patient taking differently: Place 1-2 sprays into both nostrils daily as needed for allergies. 05/31/20  Yes Wieters, Hallie C, PA-C  gabapentin (NEURONTIN) 300 MG capsule Take 1  capsule (300 mg total) by mouth at bedtime. 04/25/20  Yes Nicolette Bang, DO  glipiZIDE (GLUCOTROL XL) 10 MG 24 hr tablet Take 1 tablet (10 mg total) by mouth daily with breakfast. 04/25/20  Yes Nicolette Bang, DO  Ibuprofen-Acetaminophen (ADVIL DUAL ACTION PO) Take 2 tablets by mouth daily as needed (pain/headache).   Yes [provider]  insulin detemir (LEVEMIR) 100 UNIT/ML FlexPen Inject 38 Units into the skin 2 (two) times daily. 04/25/20  Yes Nicolette Bang, DO  Insulin Pen Needle (PEN NEEDLES) 32G X 4 MM MISC 1 each by Does not apply route 2 (two) times daily. 01/25/20  Yes Nicolette Bang, DO  naproxen (NAPROSYN) 500 MG tablet Take 1 tablet (500 mg total) by mouth 2 (two) times  daily. 08/29/20  Yes Scot Jun, FNP  ondansetron (ZOFRAN ODT) 8 MG disintegrating tablet Take 1 tablet (8 mg total) by mouth every 8 (eight) hours as needed for nausea or vomiting. 10/01/20  Yes Hazel Sams, PA-C  sitaGLIPtin (JANUVIA) 100 MG tablet Take 1 tablet (100 mg total) by mouth daily. 04/25/20  Yes Nicolette Bang, DO  triamterene-hydrochlorothiazide (MAXZIDE-25) 37.5-25 MG tablet Take 1 tablet by mouth daily. 04/25/20  Yes Nicolette Bang, DO  predniSONE (STERAPRED UNI-PAK 21 TAB) 10 MG (21) TBPK tablet Take by mouth daily. Take 6 tabs by mouth daily for 2 days, then 5 tabs for 2 days, then 4 tabs for 2 days, then 3 tabs for 2 days, 2 tabs for 2 days, then 1 tab by mouth daily for 2 days Patient not taking: No sig reported 10/10/20   Katy Apo, NP    Allergies    Metformin and related, Penicillins, and Victoza [liraglutide]  Review of Systems   Review of Systems  Constitutional: Negative for chills and fever.  HENT: Negative for congestion and sore throat.   Eyes: Positive for photophobia and visual disturbance.  Respiratory: Negative for shortness of breath.   Cardiovascular: Negative for chest pain.   Gastrointestinal: Positive for nausea. Negative for abdominal pain, diarrhea and vomiting.  Genitourinary: Negative for enuresis.  Musculoskeletal: Negative for back pain.  Skin: Negative for rash.  Neurological: Positive for headaches. Negative for dizziness and weakness.  Hematological: Does not bruise/bleed easily.    Physical Exam Updated Vital Signs BP 127/68   Pulse 88   Temp 98.5 F (36.9 C) (Oral)   Resp 20   Ht 5\' 6"  (1.676 m)   Wt (!) 150 kg   LMP 10/06/2020   SpO2 97%   BMI 53.37 kg/m   Physical Exam Vitals and nursing note reviewed.  Constitutional:      General: She is not in acute distress.    Appearance: She is not ill-appearing.  HENT:     Head: Normocephalic and atraumatic.     Nose: No congestion.  Eyes:     Extraocular Movements: Extraocular movements intact.     Conjunctiva/sclera: Conjunctivae normal.     Pupils: Pupils are equal, round, and reactive to light.  Cardiovascular:     Rate and Rhythm: Normal rate and regular rhythm.     Pulses: Normal pulses.     Heart sounds: No murmur heard. No friction rub. No gallop.   Pulmonary:     Effort: No respiratory distress.     Breath sounds: No wheezing, rhonchi or rales.  Musculoskeletal:     Cervical back: No rigidity.     Right lower leg: No edema.     Left lower leg: No edema.  Skin:    General: Skin is warm and dry.  Neurological:     Mental Status: She is alert.     GCS: GCS eye subscore is 4. GCS verbal subscore is 5. GCS motor subscore is 6.     Cranial Nerves: No facial asymmetry.     Sensory: Sensory deficit present.     Motor: No weakness.     Coordination: Romberg sign negative. Finger-Nose-Finger Test normal.     Comments: Cranial nerves II through XII are grossly intact.  Patient is having no difficulty with word finding.  Patient endorses decreased sensation in her right arm and right upper thigh.  Psychiatric:        Mood and Affect: Mood normal.  ED Results /  Procedures / Treatments   Labs (all labs ordered are listed, but only abnormal results are displayed) Labs Reviewed  CBC WITH DIFFERENTIAL/PLATELET - Abnormal; Notable for the following components:      Result Value   Hemoglobin 11.1 (*)    HCT 35.4 (*)    MCH 25.1 (*)    RDW 15.6 (*)    Platelets 479 (*)    All other components within normal limits  BASIC METABOLIC PANEL - Abnormal; Notable for the following components:   Sodium 132 (*)    Potassium 3.0 (*)    Chloride 97 (*)    Glucose, Bld 207 (*)    Calcium 8.6 (*)    All other components within normal limits  MAGNESIUM  I-STAT BETA HCG BLOOD, ED ([facility], [facility], [facility] ONLY)    EKG None  Radiology CT Head Wo Contrast  Result Date: 10/16/2020 CLINICAL DATA:  Headache, chronic, new features or increased frequency EXAM: CT HEAD WITHOUT CONTRAST TECHNIQUE: Contiguous axial images were obtained from the base of the skull through the vertex without intravenous contrast. COMPARISON:  Remote head CT 07/22/2010 FINDINGS: Brain: No evidence of acute infarction, hemorrhage, hydrocephalus, extra-axial collection or mass lesion/mass effect. Vascular: No hyperdense vessel or unexpected calcification. Skull: No fracture or focal lesion. Sinuses/Orbits: Paranasal sinuses and mastoid air cells are clear. The visualized orbits are unremarkable. Other: None. IMPRESSION: Negative noncontrast head CT. Electronically Signed   By: Keith Rake M.D.   On: 10/16/2020 23:27    Procedures Procedures   Medications Ordered in ED Medications  potassium chloride SA (KLOR-CON) CR tablet 40 mEq (40 mEq Oral Given 10/17/20 0012)  metoCLOPramide (REGLAN) injection 10 mg (10 mg Intravenous Given 10/17/20 0013)  cyclobenzaprine (FLEXERIL) tablet 10 mg (10 mg Oral Given 10/17/20 0012)  diphenhydrAMINE (BENADRYL) injection 25 mg (25 mg Intravenous Given 10/17/20 0014)  sodium chloride 0.9 % bolus 500 mL (0 mLs Intravenous Stopped 10/17/20 0142)  ondansetron (ZOFRAN)  injection 4 mg (4 mg Intravenous Given 10/17/20 0013)    ED Course  I have reviewed the triage vital signs and the nursing notes.  Pertinent labs & imaging results that were available during my care of the patient were reviewed by me and considered in my medical decision making (see chart for details).    MDM Rules/Calculators/A&P                         Initial impression-patient presents with headaches, change in vision, paresthesias in her right extremities.  She is alert, does not appear in acute distress, vital signs notable for hypertension.  Suspect complex migraine, will provide patient with a migraine cocktail, add on CT head and reassess.  Work-up-CBC shows normocytic anemia appears to be baseline for patient.  BMP shows hyponatremia of 132, hypokalemia 3, elevated glucose of 207.  CT head negative for acute findings.  Reassessment patient has noted hypokalemia will provide patient with oral potassium and add on magnesium.   Patient was reassessed after providing her with a migraine cocktail, she states she is feeling much better, symptoms have resolved, vital signs have remained stable.  Patient agreeable for discharge at this time.  Rule out-low suspicion for CVA, intracranial head bleed, intracranial mass as there is no neuro deficits on my exam, CT head is negative for acute findings.  I suspect patient's blurry vision and paresthesias are secondary to a complex migraine as it resolved after headache was addressed.  Low  suspicion for meningitis as there is no meningeal signs on my exam, no signs of infection present.  Low suspicion for glaucoma as patient denies painful vision change, patient has low risk factors.  Low suspicion for neuritis as etiology is atypical would expect nonpainful vision loss, and it would not come and go as patient describes it.  Low suspicion for systemic infection as patient nontoxic-appearing and vital signs reassuring, no evidence of infection on my  exam.  Plan-suspect patient suffering from a complex migraine.  Will recommend over-the-counter pain medications, follow-up with neurology for further evaluation.  Patient has noted hypokalemia suspect this secondary due to HCTZ use, will have her follow-up with PCP for further evaluation.  Vital signs have remained stable, no indication for hospital admission.  Patient discussed with attending and they agreed with assessment and plan.  Patient given at home care as well strict return precautions.  Patient verbalized that they understood agreed to said plan.   Final Clinical Impression(s) / ED Diagnoses Final diagnoses:  Acute nonintractable headache, unspecified headache type  Hypokalemia    Rx / DC Orders ED Discharge Orders    None       Marcello Fennel, PA-C 10/17/20 0157    Orpah Greek, MD 10/17/20 [phone]

## 2020-10-16 NOTE — ED Notes (Signed)
Received verbal report from [place]

## 2020-10-16 NOTE — ED Provider Notes (Incomplete)
[facility] EMERGENCY DEPARTMENT Provider Note   CSN: [id number] Arrival date & time: 10/16/20  2032     History Chief Complaint  Patient presents with  . Blurred Vision/Head Pressure    Ruth [name] is a 45 y.o. female.  HPI   Patient with significant medical history of chronic back pain, bipolar, fibromyalgia, hypertension, obesity, type 2 diabetes presents with chief complaint of visual changes.  She endorses this started approximately 1 weeks ago, describes her vision as going blurry with farsightedness, has no problem with her nearsighted vision, it only comes on when she has a headache and it resolves after the headache stops.  Patient denies  recent trauma to her eyes, no history of eye issues in the past.  Patient describes her headache as a throbbing sensation on the right side of her head, associated with photophobia increase sensitivity to noise, has nausea without vomiting. She also has right-sided shoulder pain and numbness on her right arm.  States this all resolves after headache stops.  Patient has been taking Tylenol which provides her with relief for couple hours and then her headache starts up again.  She  never been diagnosed with a migraine the past, denies systemic infection like fever chills, denies IV drug use, denies recent head trauma, is not on anticoagulant.  Patient denies fevers, chills, chest pain, shortness breath, abdominal pain, vomiting, diarrhea, worsening pedal edema.  Past Medical History:  Diagnosis Date  . Anemia   . Arthritis    chronic back pain   . Asthma   . Bipolar 1 disorder ([place])   . Fibromyalgia   . Hx of migraines    occ  . Hypertension   . Hypothyroidism   . Obesity   . Sleep apnea   . Thrombocytosis 01/26/2020  . Type 2 diabetes mellitus ([place])    Type 2    Patient Active Problem List   Diagnosis Date Noted  . BMI 45.0-49.9, adult ([place]) 09/04/2020  . Right arm pain 02/14/2020  . Hyperlipidemia  associated with type 2 diabetes mellitus ([place]) 09/19/2019  . Abnormal uterine bleeding (AUB) 12/07/2017  . Abnormal mammogram of both breasts 11/02/2016  . Hypothyroidism 06/21/2009  . Type 2 diabetes mellitus with peripheral neuropathy ([place]) 06/21/2009  . BIPOLAR AFFECTIVE DISORDER 06/21/2009  . 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 INADEQUATE SLEEP HYGIENE 06/21/2009  . OBSTRUCTIVE SLEEP APNEA 06/21/2009  . 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 CARPAL TUNNEL SYNDROME 06/20/2009  . MERALGIA PARESTHETICA 06/20/2009  . Essential hypertension 06/20/2009  . FIBROMYALGIA 06/20/2009  . CEPHALGIA 06/20/2009    Past Surgical History:  Procedure Laterality Date  . CARPAL TUNNEL RELEASE Right   . CESAREAN SECTION     x2  . DILITATION & CURRETTAGE/HYSTROSCOPY WITH NOVASURE ABLATION N/A 12/07/2017   Procedure: DILATATION & CURETTAGE/HYSTEROSCOPY WITH ATTEMPED NOVASURE ABLATION, FAILED/ENDOMETERIAL ABLATION WITH HTA SYSTEM;  Surgeon: Lavonia Drafts, MD;  Location: [facility];  Service: Gynecology;  Laterality: N/A;  . ELBOW SURGERY     "surgery for nerve pain"  . GANGLION CYST EXCISION Right    right arm   . RECTOVAGINAL FISTULA CLOSURE     after vag delivery  . TONSILLECTOMY     childhood  . TUBAL LIGATION       OB History    Gravida  7   Para  5   Term  4   Preterm  1   AB  2   Living  5     SAB  2   IAB      Ectopic      Multiple      Live Births  5        Obstetric Comments  Vag x 2>c/s emergency>vbac>c/s and BTL H/o RV fistula and then repaired after vag delivery        Family History  Problem Relation Age of Onset  . Thyroid disease Mother   . Diabetes Daughter   . Diabetes Son   . Breast cancer Neg Hx     Social History   Tobacco Use  . Smoking status: Current Every Day Smoker    Packs/day: 0.25    Types: Cigarettes  . Smokeless tobacco: Never Used  Vaping Use  . Vaping Use: Never used  Substance Use Topics  .  Alcohol use: Yes    Comment: occasionally  . Drug use: Yes    Types: Marijuana    Home Medications Prior to Admission medications   Medication Sig Start Date End Date Taking? Authorizing Provider  Accu-Chek FastClix Lancets MISC E1 1.65, FOUR BLOOD GLUCOSE TESTING FASTING ANND TWO HOURS AFTER SUPPER 07/01/18   [provider]  ACCU-CHEK GUIDE test strip CHECK BLOOD SUGARS TWICE, E11.65 12/21/18   [provider]  albuterol (ACCUNEB) 0.63 MG/3ML nebulizer solution USE 1 VIAL VIA NEBULIZER EVERY 4-6 HOURS AS NEEDED 01/25/20   Nicolette Bang, DO  albuterol (VENTOLIN HFA) 108 (90 Base) MCG/ACT inhaler Inhale 1-2 puffs into the lungs every 6 (six) hours as needed for wheezing. 01/25/20   Nicolette Bang, DO  amLODipine (NORVASC) 5 MG tablet Take 1 tablet (5 mg total) by mouth daily. 04/25/20   Nicolette Bang, DO  atorvastatin (LIPITOR) 80 MG tablet Take 1 tablet (80 mg total) by mouth daily at 6 PM. 04/25/20   Nicolette Bang, DO  budesonide-formoterol [facility]) 160-4.5 MCG/ACT inhaler Inhale 2 puffs into the lungs 2 (two) times daily. 01/25/20   Nicolette Bang, DO  cetirizine (ZYRTEC) 10 MG tablet Take 1 tablet (10 mg total) by mouth daily. 01/25/20   Nicolette Bang, DO  cyclobenzaprine (FLEXERIL) 10 MG tablet Take 1 tablet (10 mg total) by mouth 3 (three) times daily as needed for muscle spasms. 08/29/20   Scot Jun, FNP  fluticasone (FLONASE) 50 MCG/ACT nasal spray Place 1-2 sprays into both nostrils daily. 05/31/20   Wieters, Hallie C, PA-C  gabapentin (NEURONTIN) 300 MG capsule Take 1 capsule (300 mg total) by mouth at bedtime. 04/25/20   Nicolette Bang, DO  glipiZIDE (GLUCOTROL XL) 10 MG 24 hr tablet Take 1 tablet (10 mg total) by mouth daily with breakfast. 04/25/20   Nicolette Bang, DO  insulin detemir (LEVEMIR) 100 UNIT/ML FlexPen Inject 38 Units into the skin 2 (two) times daily.  04/25/20   Nicolette Bang, DO  Insulin Pen Needle (PEN NEEDLES) 32G X 4 MM MISC 1 each by Does not apply route 2 (two) times daily. 01/25/20   Nicolette Bang, DO  naproxen (NAPROSYN) 500 MG tablet Take 1 tablet (500 mg total) by mouth 2 (two) times daily. 08/29/20   Scot Jun, FNP  ondansetron (ZOFRAN ODT) 8 MG disintegrating tablet Take 1 tablet (8 mg total) by mouth every 8 (eight) hours as needed for nausea or vomiting. 10/01/20   Hazel Sams, PA-C  predniSONE (STERAPRED UNI-PAK 21 TAB) 10 MG (21) TBPK tablet Take by mouth daily. Take 6 tabs by mouth daily for 2 days, then 5 tabs  for 2 days, then 4 tabs for 2 days, then 3 tabs for 2 days, 2 tabs for 2 days, then 1 tab by mouth daily for 2 days 10/10/20   Katy Apo, NP  sitaGLIPtin (JANUVIA) 100 MG tablet Take 1 tablet (100 mg total) by mouth daily. 04/25/20   Nicolette Bang, DO  triamterene-hydrochlorothiazide (MAXZIDE-25) 37.5-25 MG tablet Take 1 tablet by mouth daily. 04/25/20   Nicolette Bang, DO    Allergies    Metformin and related, Penicillins, and Victoza [liraglutide]  Review of Systems   Review of Systems  Constitutional: Negative for chills and fever.  HENT: Negative for congestion and sore throat.   Eyes: Positive for photophobia and visual disturbance.  Respiratory: Negative for shortness of breath.   Cardiovascular: Negative for chest pain.  Gastrointestinal: Positive for nausea. Negative for abdominal pain, diarrhea and vomiting.  Genitourinary: Negative for enuresis.  Musculoskeletal: Negative for back pain.  Skin: Negative for rash.  Neurological: Positive for headaches. Negative for dizziness and weakness.  Hematological: Does not bruise/bleed easily.    Physical Exam Updated Vital Signs BP (!) 173/100 (BP Location: Left Arm)   Pulse (!) 109   Temp 98.8 F (37.1 C) (Oral)   Resp 16   Ht 5\' 6"  (1.676 m)   Wt (!) 150 kg   LMP 10/06/2020   SpO2 100%    BMI 53.37 kg/m   Physical Exam Vitals and nursing note reviewed.  Constitutional:      General: She is not in acute distress.    Appearance: She is not ill-appearing.  HENT:     Head: Normocephalic and atraumatic.     Nose: No congestion.  Eyes:     Extraocular Movements: Extraocular movements intact.     Conjunctiva/sclera: Conjunctivae normal.     Pupils: Pupils are equal, round, and reactive to light.  Cardiovascular:     Rate and Rhythm: Normal rate and regular rhythm.     Pulses: Normal pulses.     Heart sounds: No murmur heard. No friction rub. No gallop.   Pulmonary:     Effort: No respiratory distress.     Breath sounds: No wheezing, rhonchi or rales.  Musculoskeletal:     Cervical back: No rigidity.     Right lower leg: No edema.     Left lower leg: No edema.  Skin:    General: Skin is warm and dry.  Neurological:     Mental Status: She is alert.     GCS: GCS eye subscore is 4. GCS verbal subscore is 5. GCS motor subscore is 6.     Cranial Nerves: No facial asymmetry.     Sensory: Sensory deficit present.     Motor: No weakness.     Coordination: Romberg sign negative. Finger-Nose-Finger Test normal.     Comments: Cranial nerves II through XII are grossly intact.  Patient is having no difficulty with word finding.  Patient endorses decreased sensation in her right arm and right upper thigh.  Psychiatric:        Mood and Affect: Mood normal.     ED Results / Procedures / Treatments   Labs (all labs ordered are listed, but only abnormal results are displayed) Labs Reviewed  CBC WITH DIFFERENTIAL/PLATELET - Abnormal; Notable for the following components:      Result Value   Hemoglobin 11.1 (*)    HCT 35.4 (*)    MCH 25.1 (*)    RDW 15.6 (*)  Platelets 479 (*)    All other components within normal limits  BASIC METABOLIC PANEL - Abnormal; Notable for the following components:   Sodium 132 (*)    Potassium 3.0 (*)    Chloride 97 (*)    Glucose, Bld  207 (*)    Calcium 8.6 (*)    All other components within normal limits  I-STAT BETA HCG BLOOD, ED ([facility], [facility], [facility] ONLY)    EKG None  Radiology No results found.  Procedures Procedures {Remember to document critical care time when appropriate:1}  Medications Ordered in ED Medications  potassium chloride SA (KLOR-CON) CR tablet 40 mEq (has no administration in time range)  metoCLOPramide (REGLAN) injection 10 mg (has no administration in time range)  cyclobenzaprine (FLEXERIL) tablet 10 mg (has no administration in time range)  diphenhydrAMINE (BENADRYL) injection 25 mg (has no administration in time range)  sodium chloride 0.9 % bolus 500 mL (has no administration in time range)  ondansetron (ZOFRAN) injection 4 mg (has no administration in time range)    ED Course  I have reviewed the triage vital signs and the nursing notes.  Pertinent labs & imaging results that were available during my care of the patient were reviewed by me and considered in my medical decision making (see chart for details).    MDM Rules/Calculators/A&P                         Initial impression-patient presents with headaches, change in vision, paresthesias in her right extremities.  She is alert, does not appear in acute distress, vital signs notable for hypertension.  Suspect complex migraine, will provide patient with a migraine cocktail, add on CT head and reassess.  Work-up-CBC shows normocytic anemia appears to be baseline for patient.  BMP shows hyponatremia of 132, hypokalemia 3, elevated glucose of 207.  CT head negative for acute findings.  Reassessment patient has noted hypokalemia will provide patient with oral potassium and add on magnesium.   Consult**  Rule out-low suspicion for CVA, intracranial head bleed, intracranial mass as there is no neuro deficits on my exam, CT head is negative for acute findings.  I suspect patient's blurry vision*****  Plan-  Vital signs have remained stable,  no indication for hospital admission.  Patient discussed with attending and they agreed with assessment and plan.  Patient given at home care as well strict return precautions.  Patient verbalized that they understood agreed to said plan.   Final Clinical Impression(s) / ED Diagnoses Final diagnoses:  None    Rx / DC Orders ED Discharge Orders    None

## 2020-10-16 NOTE — ED Triage Notes (Signed)
Patient reports distant blurred vision with head pressure onset last week , no emesis or fever .

## 2020-10-16 NOTE — ED Notes (Signed)
Provider at bedside

## 2020-10-17 LAB — MAGNESIUM: Magnesium: 1.9 mg/dL (ref 1.7–2.4)

## 2020-10-17 NOTE — Discharge Instructions (Addendum)
I suspect you are suffering from a migraine, I recommend over-the-counter pain medication like ibuprofen and Tylenol every 6 hours as needed please follow dosing on back of bottle.  You have  noted low potassium suspect this is from your blood pressure medications.  Please continue all medications as prescribed.  I want you to follow-up with neurology for further evaluation of your headaches.  Also want to follow-up with your primary care provider for continue management of your low potassium.  Come back to the emergency department if you develop chest pain, shortness of breath, severe abdominal pain, uncontrolled nausea, vomiting, diarrhea.

## 2020-10-18 ENCOUNTER — Telehealth: Payer: Self-pay | Admitting: *Deleted

## 2020-10-18 NOTE — Telephone Encounter (Signed)
Transition Care Management Follow-up Telephone Call  Date of discharge and from where: 10/17/2020 - [facility]  How have you been since you were released from the hospital? "I am okay"  Any questions or concerns? No  Items Reviewed:  Did the pt receive and understand the discharge instructions provided? Yes   Medications obtained and verified? Yes   Other? No   Any new allergies since your discharge? No   Dietary orders reviewed? No  Do you have support at home? Yes    Functional Questionnaire: (I = Independent and D = Dependent) ADLs: I  Bathing/Dressing- I  Meal Prep- I  Eating- I  Maintaining continence- I  Transferring/Ambulation- I  Managing Meds- I  Follow up appointments reviewed:   PCP Hospital f/u appt confirmed? No    Specialist Hospital f/u appt confirmed? No    Are transportation arrangements needed? No   If their condition worsens, is the pt aware to call PCP or go to the Emergency Dept.? Yes  Was the patient provided with contact information for the PCP's office or ED? Yes  Was to pt encouraged to call back with questions or concerns? Yes

## 2020-10-26 ENCOUNTER — Other Ambulatory Visit: Payer: Self-pay

## 2020-10-26 ENCOUNTER — Ambulatory Visit (HOSPITAL_COMMUNITY)
Admission: EM | Admit: 2020-10-26 | Discharge: 2020-10-26 | Disposition: A | Discharge status: Home / Self Care | Payer: Medicaid Other | Attending: Student | Admitting: Student

## 2020-10-26 ENCOUNTER — Encounter (HOSPITAL_COMMUNITY): Payer: Self-pay

## 2020-10-26 DIAGNOSIS — Z794 Long term (current) use of insulin: Secondary | ICD-10-CM

## 2020-10-26 DIAGNOSIS — J4541 Moderate persistent asthma with (acute) exacerbation: Secondary | ICD-10-CM

## 2020-10-26 DIAGNOSIS — L02412 Cutaneous abscess of left axilla: Secondary | ICD-10-CM | POA: Diagnosis not present

## 2020-10-26 DIAGNOSIS — E1169 Type 2 diabetes mellitus with other specified complication: Secondary | ICD-10-CM

## 2020-10-26 MED ORDER — PREDNISONE 10 MG (21) PO TBPK: ORAL_TABLET | Freq: Every day | ORAL | 0 refills | Status: DC

## 2020-10-26 MED ORDER — PROMETHAZINE-DM 6.25-15 MG/5ML PO SYRP: 5.0000 mL | ORAL_SOLUTION | Freq: Four times a day (QID) | ORAL | 0 refills | Status: DC | PRN

## 2020-10-26 MED ORDER — DOXYCYCLINE HYCLATE 100 MG PO CAPS: 100.0000 mg | ORAL_CAPSULE | Freq: Two times a day (BID) | ORAL | 0 refills | Status: AC

## 2020-10-26 MED ORDER — BENZONATATE 100 MG PO CAPS: 100.0000 mg | ORAL_CAPSULE | Freq: Three times a day (TID) | ORAL | 0 refills | Status: DC

## 2020-10-26 NOTE — Discharge Instructions (Addendum)
-  Doxycycline twice daily for 7 days.  Make sure to wear sunscreen while on this medication as it can increase your chance of sunburn.  Warm compresses to help express additional pus. -Promethazine DM cough syrup for congestion/cough. This could make you drowsy, so take at night before bed. -Tessalon (Benzonatate) as needed for cough. Take one pill up to 3x daily (every 8 hours) -Prednisone taper for cough/bronchitis. I recommend taking this in the morning as it could give you energy.  -Continue albuterol inhaler and nebulizer solution. -Seek additional medical attention if symptoms worsen or persist, like new fever/chills, worsening shortness of breath, dizziness, chest pain.

## 2020-10-26 NOTE — ED Triage Notes (Signed)
Pt reports shortness of breath "when coughing" x 1 day;  boil in the left axilla x 2 days. Denies fever.

## 2020-10-26 NOTE — ED Provider Notes (Signed)
[place]    CSN: [id number] Arrival date & time: 10/26/20  1158      History   Chief Complaint Chief Complaint  Patient presents with  . Shortness of Breath  . Abscess    HPI Ruth [name] is a 45 y.o. female presenting with shortness of breath with coughing for 3 days and boil under left arm x2 days.  Medical history asthma for which she takes albuterol inhaler, morbid obesity, fibromyalgia, hypertension, hypothyroid, type 2 diabetes.  States she spent a lot of time outside few days ago and after developed this cough.  Describes it as hacking and productive with white sputum.  Albuterol inhaler and nebulizer is providing minimal relief.  Denies other URI symptoms including fever/chills, nasal congestion, nausea/vomiting/diarrhea, body aches.  Also notes boil under her left arm for 2 days, this is draining on its own.  She is wondering if she has hidradenitis as she gets abscesses like this frequently. Shortness of breath only during coughing episodes.   HPI  Past Medical History:  Diagnosis Date  . Anemia   . Arthritis    chronic back pain   . Asthma   . Bipolar 1 disorder ([place])   . Fibromyalgia   . Hx of migraines    occ  . Hypertension   . Hypothyroidism   . Obesity   . Sleep apnea   . Thrombocytosis 01/26/2020  . Type 2 diabetes mellitus ([place])    Type 2    Patient Active Problem List   Diagnosis Date Noted  . BMI 45.0-49.9, adult ([place]) 09/04/2020  . Right arm pain 02/14/2020  . Hyperlipidemia associated with type 2 diabetes mellitus ([place]) 09/19/2019  . Abnormal uterine bleeding (AUB) 12/07/2017  . Abnormal mammogram of both breasts 11/02/2016  . Hypothyroidism 06/21/2009  . Type 2 diabetes mellitus with peripheral neuropathy ([place]) 06/21/2009  . BIPOLAR AFFECTIVE DISORDER 06/21/2009  . 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 INADEQUATE SLEEP HYGIENE 06/21/2009  . OBSTRUCTIVE SLEEP APNEA 06/21/2009  . EXOGENOUS  OBESITY 06/20/2009  . CARPAL TUNNEL SYNDROME 06/20/2009  . MERALGIA PARESTHETICA 06/20/2009  . Essential hypertension 06/20/2009  . FIBROMYALGIA 06/20/2009  . CEPHALGIA 06/20/2009    Past Surgical History:  Procedure Laterality Date  . CARPAL TUNNEL RELEASE Right   . CESAREAN SECTION     x2  . DILITATION & CURRETTAGE/HYSTROSCOPY WITH NOVASURE ABLATION N/A 12/07/2017   Procedure: DILATATION & CURETTAGE/HYSTEROSCOPY WITH ATTEMPED NOVASURE ABLATION, FAILED/ENDOMETERIAL ABLATION WITH HTA SYSTEM;  Surgeon: Lavonia Drafts, MD;  Location: [facility];  Service: Gynecology;  Laterality: N/A;  . ELBOW SURGERY     "surgery for nerve pain"  . GANGLION CYST EXCISION Right    right arm   . RECTOVAGINAL FISTULA CLOSURE     after vag delivery  . TONSILLECTOMY     childhood  . 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 Authorizing Provider  benzonatate (TESSALON) 100 MG capsule Take 1 capsule (100 mg total) by mouth every 8 (eight) hours. 10/26/20  Yes Hazel Sams, PA-C  doxycycline (VIBRAMYCIN) 100 MG capsule Take 1 capsule (100 mg total) by mouth 2 (two) times daily for 7 days. 10/26/20 11/02/20 Yes Hazel Sams, PA-C  predniSONE (STERAPRED UNI-PAK 21 TAB) 10 MG (21) TBPK tablet Take by mouth daily. Take 6 tabs by mouth daily  for 2 days, then 5 tabs for 2 days, then 4 tabs for 2 days, then 3 tabs for 2 days, 2 tabs for 2 days, then 1 tab by mouth daily for 2 days 10/26/20  Yes Hazel Sams, PA-C  promethazine-dextromethorphan (PROMETHAZINE-DM) 6.25-15 MG/5ML syrup Take 5 mLs by mouth 4 (four) times daily as needed for  cough. 10/26/20  Yes Hazel Sams, PA-C  Accu-Chek FastClix Lancets MISC E1 1.65, FOUR BLOOD GLUCOSE TESTING FASTING ANND TWO HOURS AFTER SUPPER 07/01/18   [provider]  ACCU-CHEK GUIDE test strip CHECK BLOOD SUGARS TWICE, E11.65 12/21/18   [provider]  albuterol (ACCUNEB) 0.63 MG/3ML nebulizer solution USE 1 VIAL VIA NEBULIZER EVERY 4-6 HOURS AS NEEDED Patient taking differently: Take 1 ampule by nebulization every 4 (four) hours as needed for shortness of breath or wheezing. 01/25/20   Nicolette Bang, DO  albuterol (VENTOLIN HFA) 108 (90 Base) MCG/ACT inhaler Inhale 1-2 puffs into the lungs every 6 (six) hours as needed for wheezing. 01/25/20   Nicolette Bang, DO  amLODipine (NORVASC) 5 MG tablet Take 1 tablet (5 mg total) by mouth daily. 04/25/20   Nicolette Bang, DO  atorvastatin (LIPITOR) 80 MG tablet Take 1 tablet (80 mg total) by mouth daily at 6 PM. 04/25/20   Nicolette Bang, DO  budesonide-formoterol [facility]) 160-4.5 MCG/ACT inhaler Inhale 2 puffs into the lungs 2 (two) times daily. 01/25/20   Nicolette Bang, DO  cetirizine (ZYRTEC) 10 MG tablet Take 1 tablet (10 mg total) by mouth daily. Patient taking differently: Take 10 mg by mouth daily as needed for allergies. 01/25/20   Nicolette Bang, DO  cyclobenzaprine (FLEXERIL) 10 MG tablet Take 1 tablet (10 mg total) by mouth 3 (three) times daily as needed for muscle spasms. 08/29/20   Scot Jun, FNP  fluticasone (FLONASE) 50 MCG/ACT nasal spray Place 1-2 sprays into both nostrils daily. Patient taking differently: Place 1-2 sprays into both nostrils daily as needed for allergies. 05/31/20   Wieters, Hallie C, PA-C  gabapentin (NEURONTIN) 300 MG capsule Take 1 capsule (300 mg total) by mouth at bedtime. 04/25/20   Nicolette Bang, DO  glipiZIDE (GLUCOTROL XL) 10 MG 24 hr tablet Take 1 tablet (10 mg total) by mouth daily with breakfast.  04/25/20   Nicolette Bang, DO  Ibuprofen-Acetaminophen (ADVIL DUAL ACTION PO) Take 2 tablets by mouth daily as needed (pain/headache).    [provider]  insulin detemir (LEVEMIR) 100 UNIT/ML FlexPen Inject 38 Units into the skin 2 (two) times daily. 04/25/20   Nicolette Bang, DO  Insulin Pen Needle (PEN NEEDLES) 32G X 4 MM MISC 1 each by Does not apply route 2 (two) times daily. 01/25/20   Nicolette Bang, DO  naproxen (NAPROSYN) 500 MG tablet Take 1 tablet (500 mg total) by mouth 2 (two) times daily. 08/29/20   Scot Jun, FNP  ondansetron (ZOFRAN ODT) 8 MG disintegrating tablet Take 1 tablet (8 mg total)  by mouth every 8 (eight) hours as needed for nausea or vomiting. 10/01/20   Hazel Sams, PA-C  sitaGLIPtin (JANUVIA) 100 MG tablet Take 1 tablet (100 mg total) by mouth daily. 04/25/20   Nicolette Bang, DO  triamterene-hydrochlorothiazide (MAXZIDE-25) 37.5-25 MG tablet Take 1 tablet by mouth daily. 04/25/20   Nicolette Bang, DO    Family History Family History  Problem Relation Age of Onset  . Thyroid disease Mother   . Diabetes Daughter   . Diabetes Son   . Breast cancer Neg Hx     Social History Social History   Tobacco Use  . Smoking status: Current Every Day Smoker    Packs/day: 0.25    Types: Cigarettes  . Smokeless tobacco: Never Used  Vaping Use  . Vaping Use: Never used  Substance Use Topics  . Alcohol use: Yes    Comment: occasionally  . Drug use: Yes    Types: Marijuana     Allergies   Metformin and related, Penicillins, and Victoza [liraglutide]   Review of Systems Review of Systems  Constitutional: Negative for appetite change, chills and fever.  HENT: Negative for congestion, ear pain, rhinorrhea, sinus pressure, sinus pain and sore throat.   Eyes: Negative for redness and visual disturbance.  Respiratory: Positive for cough and shortness of breath. Negative for chest tightness and  wheezing.   Cardiovascular: Negative for chest pain and palpitations.  Gastrointestinal: Negative for abdominal pain, constipation, diarrhea, nausea and vomiting.  Genitourinary: Negative for dysuria, frequency and urgency.  Musculoskeletal: Negative for myalgias.  Skin:       abscess  Neurological: Negative for dizziness, weakness and headaches.  Psychiatric/Behavioral: Negative for confusion.  All other systems reviewed and are negative.    Physical Exam Triage Vital Signs ED Triage Vitals [10/26/20 1237]  Enc Vitals Group     BP (!) 133/91     Pulse Rate 98     Resp 20     Temp 98 F (36.7 C)     Temp Source Oral     SpO2 100 %     Weight      Height      Head Circumference      Peak Flow      Pain Score 8     Pain Loc      Pain Edu?      Excl. in [place]?    No data found.  Updated Vital Signs BP (!) 133/91 (BP Location: Right Wrist)   Pulse 98   Temp 98 F (36.7 C) (Oral)   Resp 20   LMP 10/06/2020 (Exact Date)   SpO2 100%   Visual Acuity Right Eye Distance:   Left Eye Distance:   Bilateral Distance:    Right Eye Near:   Left Eye Near:    Bilateral Near:     Physical Exam Vitals reviewed.  Constitutional:      General: She is not in acute distress.    Appearance: Normal appearance. She is not ill-appearing.     Interventions: She is not intubated. HENT:     Head: Normocephalic and atraumatic.     Right Ear: Hearing, tympanic membrane, ear canal and external ear normal. No swelling or tenderness. There is no impacted cerumen. No mastoid tenderness. Tympanic membrane is not perforated, erythematous, retracted or bulging.     Left Ear: Hearing, tympanic membrane, ear canal and external ear normal. No swelling or tenderness. There is no impacted cerumen. No mastoid tenderness.  Tympanic membrane is not perforated, erythematous, retracted or bulging.     Nose:     Right Sinus: No maxillary sinus tenderness or frontal sinus tenderness.     Left Sinus: No  maxillary sinus tenderness or frontal sinus tenderness.     Mouth/Throat:     Mouth: Mucous membranes are moist.     Pharynx: Uvula midline. No oropharyngeal exudate or posterior oropharyngeal erythema.     Tonsils: No tonsillar exudate.  Cardiovascular:     Rate and Rhythm: Normal rate and regular rhythm.     Heart sounds: Normal heart sounds.  Pulmonary:     Effort: Pulmonary effort is normal. No tachypnea, bradypnea, accessory muscle usage, prolonged expiration, respiratory distress or retractions. She is not intubated.     Breath sounds: Normal breath sounds and air entry. No decreased breath sounds, wheezing, rhonchi or rales.     Comments: Frequent hacking cough Chest:     Chest wall: No tenderness.  Abdominal:     General: Abdomen is flat. Bowel sounds are normal.     Tenderness: There is no abdominal tenderness. There is no guarding or rebound.  Lymphadenopathy:     Cervical: No cervical adenopathy.  Skin:    Comments: L axilla with 1.5cm area of induration with fluctuence. Small opening from which scant purulent discharge able to be expressed. With mild surrounding erythema and warmth.  Neurological:     General: No focal deficit present.     Mental Status: She is alert and oriented to person, place, and time.  Psychiatric:        Attention and Perception: Attention and perception normal.        Mood and Affect: Mood and affect normal.        Behavior: Behavior normal. Behavior is cooperative.        Thought Content: Thought content normal.        Judgment: Judgment normal.      UC Treatments / Results  Labs (all labs ordered are listed, but only abnormal results are displayed) Labs Reviewed - No data to display  EKG   Radiology No results found.  Procedures Procedures (including critical care time)  Medications Ordered in UC Medications - No data to display  Initial Impression / Assessment and Plan / UC Course  I have reviewed the triage vital signs and  the nursing notes.  Pertinent labs & imaging results that were available during my care of the patient were reviewed by me and considered in my medical decision making (see chart for details).     This patient is a 45 year old female presenting with abscess, asthma exacerbation, diabetes.Today this pt is afebrile nontachycardic nontachypneic, oxygenating well on room air, no wheezes rhonchi or rales. Breathing comfortably on room air.  Abscess is spontaneously draining.  Doxycycline for abscess, prednisone taper, Tessalon, promethazine for asthma exacerbation.  Continue albuterol.  Zyrtec for allergic rhinitis component.  Diabetes is currently well controlled, sugars running 120s nonfasting at home.  ED return precaution discussed.  Final Clinical Impressions(s) / UC Diagnoses   Final diagnoses:  Abscess of left axilla  Moderate persistent asthma with exacerbation  Type 2 diabetes mellitus with other specified complication, with long-term current use of insulin (HCC)     Discharge Instructions     -Doxycycline twice daily for 7 days.  Make sure to wear sunscreen while on this medication as it can increase your chance of sunburn.  Warm compresses to help express additional pus. -Promethazine DM cough  syrup for congestion/cough. This could make you drowsy, so take at night before bed. -Tessalon (Benzonatate) as needed for cough. Take one pill up to 3x daily (every 8 hours) -Prednisone taper for cough/bronchitis. I recommend taking this in the morning as it could give you energy.  -Continue albuterol inhaler and nebulizer solution. -Seek additional medical attention if symptoms worsen or persist, like new fever/chills, worsening shortness of breath, dizziness, chest pain.      ED Prescriptions    Medication Sig Dispense Auth. Provider   promethazine-dextromethorphan (PROMETHAZINE-DM) 6.25-15 MG/5ML syrup Take 5 mLs by mouth 4 (four) times daily as needed for cough. 118 mL Hazel Sams, PA-C   predniSONE (STERAPRED UNI-PAK 21 TAB) 10 MG (21) TBPK tablet Take by mouth daily. Take 6 tabs by mouth daily  for 2 days, then 5 tabs for 2 days, then 4 tabs for 2 days, then 3 tabs for 2 days, 2 tabs for 2 days, then 1 tab by mouth daily for 2 days 42 tablet Hazel Sams, PA-C   benzonatate (TESSALON) 100 MG capsule Take 1 capsule (100 mg total) by mouth every 8 (eight) hours. 21 capsule Hazel Sams, PA-C   doxycycline (VIBRAMYCIN) 100 MG capsule Take 1 capsule (100 mg total) by mouth 2 (two) times daily for 7 days. 14 capsule Hazel Sams, PA-C     PDMP not reviewed this encounter.   Hazel Sams, PA-C 10/26/20 1347

## 2020-11-20 ENCOUNTER — Encounter: Payer: Self-pay | Admitting: Family

## 2020-11-20 ENCOUNTER — Telehealth: Payer: Medicaid Other | Admitting: Family

## 2020-11-20 DIAGNOSIS — J4520 Mild intermittent asthma, uncomplicated: Secondary | ICD-10-CM | POA: Diagnosis not present

## 2020-11-20 DIAGNOSIS — L239 Allergic contact dermatitis, unspecified cause: Secondary | ICD-10-CM

## 2020-11-20 MED ORDER — CETIRIZINE HCL 10 MG PO TABS: 10.0000 mg | ORAL_TABLET | Freq: Every day | ORAL | 1 refills | Status: DC | PRN

## 2020-11-20 MED ORDER — PREDNISONE 10 MG (21) PO TBPK: ORAL_TABLET | ORAL | 0 refills | Status: DC

## 2020-11-20 MED ORDER — ALBUTEROL SULFATE 0.63 MG/3ML IN NEBU: 1.0000 | INHALATION_SOLUTION | RESPIRATORY_TRACT | 0 refills | Status: DC | PRN

## 2020-11-20 MED ORDER — DIPHENHYDRAMINE HCL 50 MG PO TABS: 50.0000 mg | ORAL_TABLET | Freq: Every evening | ORAL | 0 refills | Status: DC | PRN

## 2020-11-20 MED ORDER — FLUTICASONE PROPIONATE 50 MCG/ACT NA SUSP: 1.0000 | Freq: Every day | NASAL | 2 refills | Status: DC | PRN

## 2020-11-20 NOTE — Progress Notes (Signed)
   Virtual Visit  Note Due to COVID-19 pandemic this visit was conducted virtually. This visit type was conducted due to national recommendations for restrictions regarding the COVID-19 Pandemic (e.g. social distancing, sheltering in place) in an effort to limit this patient's exposure and mitigate transmission in our community. All issues noted in this document were discussed and addressed.  A physical exam was not performed with this format.  I connected with Ruth [name] on 11/20/20 at 12:38 pm  by video and verified that I am speaking with the correct person using two identifiers. Ruth [name] is currently located at home and no one is currently with her during visit. The provider, Evelina Dun, FNP is located in their office at time of visit.  I discussed the limitations, risks, security and privacy concerns of performing an evaluation and management service by video and the availability of in person appointments. I also discussed with the patient that there may be a patient responsible charge related to this service. The patient expressed understanding and agreed to proceed.   History and Present Illness:  Pt presents today with facial redness, itching, and mild swelling that started Sunday evening. She has been taking benadryl and zyrtec. Rash This is a new problem. The current episode started in the past 7 days. The affected locations include the face. The rash is characterized by redness, itchiness, dryness and pain. Associated with: outside around some trees. Pertinent negatives include no rhinorrhea, shortness of breath or sore throat. Past treatments include cold compress and moisturizer. The treatment provided mild relief.       Review of Systems  HENT: Negative for rhinorrhea and sore throat.   Respiratory: Negative for shortness of breath.   Skin: Positive for rash.  All other systems reviewed and are negative.    Observations/Objective: No SOB or  distress noted, facial redness on cheeks and mild eye lid swelling.   Assessment and Plan: 1. Allergic contact dermatitis, unspecified trigger Avoid scratching  Cool compresses Recommend following up with allergen specialists  Continue Benadryl and zyrtec - predniSONE (STERAPRED UNI-PAK 21 TAB) 10 MG (21) TBPK tablet; Use as directed  Dispense: 21 tablet; Refill: 0  2. Mild intermittent asthma, unspecified whether complicated - albuterol (ACCUNEB) 0.63 MG/3ML nebulizer solution; Take 3 mLs (0.63 mg total) by nebulization every 4 (four) hours as needed for wheezing or shortness of breath.  Dispense: 200 mL; Refill: 0     I discussed the assessment and treatment plan with the patient. The patient was provided an opportunity to ask questions and all were answered. The patient agreed with the plan and demonstrated an understanding of the instructions.   The patient was advised to call back or seek an in-person evaluation if the symptoms worsen or if the condition fails to improve as anticipated.  The above assessment and management plan was discussed with the patient. The patient verbalized understanding of and has agreed to the management plan. Patient is aware to call the clinic if symptoms persist or worsen. Patient is aware when to return to the clinic for a follow-up visit. Patient educated on when it is appropriate to go to the emergency department.   Time call ended:  12:45 pm   I provided 7 minutes of   face-to-face time during this encounter.    Evelina Dun, FNP

## 2020-12-22 ENCOUNTER — Encounter (HOSPITAL_COMMUNITY): Payer: Self-pay

## 2020-12-22 ENCOUNTER — Ambulatory Visit (HOSPITAL_COMMUNITY)
Admission: EM | Admit: 2020-12-22 | Discharge: 2020-12-22 | Disposition: A | Discharge status: Home / Self Care | Payer: Medicaid Other | Attending: Physician Assistant | Admitting: Physician Assistant

## 2020-12-22 DIAGNOSIS — M542 Cervicalgia: Secondary | ICD-10-CM | POA: Diagnosis not present

## 2020-12-22 DIAGNOSIS — M5412 Radiculopathy, cervical region: Secondary | ICD-10-CM

## 2020-12-22 DIAGNOSIS — M79601 Pain in right arm: Secondary | ICD-10-CM | POA: Diagnosis not present

## 2020-12-22 DIAGNOSIS — R202 Paresthesia of skin: Secondary | ICD-10-CM

## 2020-12-22 DIAGNOSIS — R200 Anesthesia of skin: Secondary | ICD-10-CM | POA: Diagnosis not present

## 2020-12-22 MED ORDER — KETOROLAC TROMETHAMINE 30 MG/ML IJ SOLN
INTRAMUSCULAR | Status: AC
  Filled 2020-12-22: qty 1

## 2020-12-22 MED ORDER — KETOROLAC TROMETHAMINE 30 MG/ML IJ SOLN
30.0000 mg | Freq: Once | INTRAMUSCULAR | Status: AC
Start: 2020-12-22 — End: 2020-12-22
  Administered 2020-12-22: 30 mg via INTRAMUSCULAR

## 2020-12-22 MED ORDER — NAPROXEN 500 MG PO TABS: 500.0000 mg | ORAL_TABLET | Freq: Two times a day (BID) | ORAL | 0 refills | Status: DC

## 2020-12-22 MED ORDER — CYCLOBENZAPRINE HCL 10 MG PO TABS: 10.0000 mg | ORAL_TABLET | Freq: Two times a day (BID) | ORAL | 0 refills | Status: DC | PRN

## 2020-12-22 NOTE — ED Triage Notes (Signed)
Pt c/o neck swelling and states it causes right arm numbness X 1 day.

## 2020-12-22 NOTE — ED Provider Notes (Signed)
[facility]    CSN: [id number] Arrival date & time: 12/22/20  1655      History   Chief Complaint Chief Complaint  Patient presents with   Arm Pain   Neck Pain    HPI Ruth [name] is a 45 y.o. female.   Patient presents today with a 2-day history of severe right neck pain.  She does have a history of neck pain and intermittent radiculopathy symptoms but states current symptoms are much more severe than previous episodes of this condition.  She denies any known injury or increase in activity prior to symptom onset.  She reports waking up at night as result of pain that has persisted since that time.  Pain is rated 8 on a 0-10 pain scale, localized to right neck with radiation throughout right arm, described as aching with periodic sharp pains, worse with certain movements or palpation, no alleviating factors identified.  She has tried Tylenol and ibuprofen without improvement of symptoms.  She reports associated pain and numbness in her right arm.  She has noticed an area of swelling that popped up at the same time symptoms began.  She denies any fever, nausea, vomiting, vision changes.  She was experiencing headache yesterday when symptoms began but this has since improved.   Past Medical History:  Diagnosis Date   Anemia    Arthritis    chronic back pain    Asthma    Bipolar 1 disorder (HCC)    Fibromyalgia    Hx of migraines    occ   Hypertension    Hypothyroidism    Obesity    Sleep apnea    Thrombocytosis 01/26/2020   Type 2 diabetes mellitus (Ruth [name])    Type 2    Patient Active Problem List   Diagnosis Date Noted   BMI 45.0-49.9, adult ([name]) 09/04/2020   Right arm pain 02/14/2020   Hyperlipidemia associated with type 2 diabetes mellitus ([name]) 09/19/2019   Abnormal uterine bleeding (AUB) 12/07/2017   Abnormal mammogram of both breasts 11/02/2016   Hypothyroidism 06/21/2009   Type 2 diabetes mellitus with peripheral neuropathy ([name]) 06/21/2009    BIPOLAR AFFECTIVE DISORDER 06/21/2009   PERSISTENT DISORDER INITIATING/MAINTAINING SLEEP 06/21/2009   INADEQUATE SLEEP HYGIENE 06/21/2009   OBSTRUCTIVE SLEEP APNEA 06/21/2009   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   CARPAL TUNNEL SYNDROME 06/20/2009   MERALGIA PARESTHETICA 06/20/2009   Essential hypertension 06/20/2009   FIBROMYALGIA 06/20/2009   CEPHALGIA 06/20/2009    Past Surgical History:  Procedure Laterality Date   CARPAL TUNNEL RELEASE Right    CESAREAN SECTION     x2   DILITATION & CURRETTAGE/HYSTROSCOPY WITH NOVASURE ABLATION N/A 12/07/2017   Procedure: DILATATION & CURETTAGE/HYSTEROSCOPY WITH ATTEMPED NOVASURE ABLATION, FAILED/ENDOMETERIAL ABLATION WITH HTA SYSTEM;  Surgeon: Lavonia Drafts, MD;  Location: [facility];  Service: Gynecology;  Laterality: N/A;   ELBOW SURGERY     "surgery for nerve pain"   GANGLION CYST EXCISION Right    right arm    RECTOVAGINAL FISTULA CLOSURE     after vag delivery   TONSILLECTOMY     childhood   TUBAL LIGATION      OB History     Gravida  7   Para  5   Term  4   Preterm  1   AB  2   Living  5      SAB  2   IAB      Ectopic      Multiple  Live Births  5        Obstetric Comments  Vag x 2>c/s emergency>vbac>c/s and BTL 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 Authorizing Provider  Accu-Chek FastClix Lancets MISC E1 1.65, FOUR BLOOD GLUCOSE TESTING FASTING ANND TWO HOURS AFTER SUPPER 07/01/18   [provider]  ACCU-CHEK GUIDE test strip CHECK BLOOD SUGARS TWICE, E11.65 12/21/18   [provider]  albuterol (ACCUNEB) 0.63 MG/3ML nebulizer solution Take 3 mLs (0.63 mg total) by nebulization every 4 (four) hours as needed for wheezing or shortness of breath. 11/20/20   Sharion Balloon, FNP  albuterol (VENTOLIN HFA) 108 (90 Base) MCG/ACT inhaler Inhale 1-2 puffs into the  lungs every 6 (six) hours as needed for wheezing. 01/25/20   Nicolette Bang, MD  amLODipine (NORVASC) 5 MG tablet Take 1 tablet (5 mg total) by mouth daily. 04/25/20   Nicolette Bang, MD  atorvastatin (LIPITOR) 80 MG tablet Take 1 tablet (80 mg total) by mouth daily at 6 PM. 04/25/20   Nicolette Bang, MD  benzonatate (TESSALON) 100 MG capsule Take 1 capsule (100 mg total) by mouth every 8 (eight) hours. 10/26/20   Hazel Sams, PA-C  budesonide-formoterol [facility]) 160-4.5 MCG/ACT inhaler Inhale 2 puffs into the lungs 2 (two) times daily. 01/25/20   Nicolette Bang, MD  cetirizine (ZYRTEC) 10 MG tablet Take 1 tablet (10 mg total) by mouth daily as needed for allergies. 11/20/20   Sharion Balloon, FNP  cyclobenzaprine (FLEXERIL) 10 MG tablet Take 1 tablet (10 mg total) by mouth 2 (two) times daily as needed for muscle spasms. 12/22/20   Riannah Stagner, Derry Skill, PA-C  diphenhydrAMINE (BENADRYL) 50 MG tablet Take 1 tablet (50 mg total) by mouth at bedtime as needed for itching. 11/20/20   Sharion Balloon, FNP  fluticasone (FLONASE) 50 MCG/ACT nasal spray Place 1-2 sprays into both nostrils daily as needed for allergies. 11/20/20   Sharion Balloon, FNP  gabapentin (NEURONTIN) 300 MG capsule Take 1 capsule (300 mg total) by mouth at bedtime. 04/25/20   Nicolette Bang, MD  glipiZIDE (GLUCOTROL XL) 10 MG 24 hr tablet Take 1 tablet (10 mg total) by mouth daily with breakfast. 04/25/20   Nicolette Bang, MD  Ibuprofen-Acetaminophen (ADVIL DUAL ACTION PO) Take 2 tablets by mouth daily as needed (pain/headache).    [provider]  insulin detemir (LEVEMIR) 100 UNIT/ML FlexPen Inject 38 Units into the skin 2 (two) times daily. 04/25/20   Nicolette Bang, MD  Insulin Pen Needle (PEN NEEDLES) 32G X 4 MM MISC 1 each by Does not apply route 2 (two) times daily. 01/25/20   Nicolette Bang, MD  naproxen (NAPROSYN) 500 MG tablet Take 1  tablet (500 mg total) by mouth 2 (two) times daily. 12/22/20   Kohlton Gilpatrick, Derry Skill, PA-C  ondansetron (ZOFRAN ODT) 8 MG disintegrating tablet Take 1 tablet (8 mg total) by mouth every 8 (eight) hours as needed for nausea or vomiting. 10/01/20   Hazel Sams, PA-C  predniSONE (STERAPRED UNI-PAK 21 TAB) 10 MG (21) TBPK tablet Use as directed 11/20/20   Evelina Dun A, FNP  promethazine-dextromethorphan (PROMETHAZINE-DM) 6.25-15 MG/5ML syrup Take 5 mLs by mouth 4 (four) times daily as needed for cough. 10/26/20   Hazel Sams, PA-C  sitaGLIPtin (JANUVIA) 100 MG  tablet Take 1 tablet (100 mg total) by mouth daily. 04/25/20   Nicolette Bang, MD  triamterene-hydrochlorothiazide (MAXZIDE-25) 37.5-25 MG tablet Take 1 tablet by mouth daily. 04/25/20   Nicolette Bang, MD    Family History Family History  Problem Relation Age of Onset   Thyroid disease Mother    Diabetes Daughter    Diabetes Son    Breast cancer Neg Hx     Social History Social History   Tobacco Use   Smoking status: Every Day    Packs/day: 0.25    Pack years: 0.00    Types: Cigarettes   Smokeless tobacco: Never  Vaping Use   Vaping Use: Never used  Substance Use Topics   Alcohol use: Yes    Comment: occasionally   Drug use: Yes    Types: Marijuana     Allergies   Metformin and related, Penicillins, and Victoza [liraglutide]   Review of Systems Review of Systems  Constitutional:  Positive for activity change. Negative for appetite change, fatigue and fever.  Respiratory:  Negative for cough and shortness of breath.   Cardiovascular:  Negative for chest pain.  Gastrointestinal:  Negative for abdominal pain, diarrhea, nausea and vomiting.  Musculoskeletal:  Positive for arthralgias, back pain, myalgias and neck pain.  Neurological:  Positive for numbness. Negative for dizziness, weakness, light-headedness and headaches.    Physical Exam Triage Vital Signs ED Triage Vitals  Enc Vitals Group      BP 12/22/20 1818 (!) 151/94     Pulse Rate 12/22/20 1818 86     Resp 12/22/20 1818 19     Temp 12/22/20 1818 98.1 F (36.7 C)     Temp Source 12/22/20 1818 Oral     SpO2 12/22/20 1818 100 %     Weight --      Height --      Head Circumference --      Peak Flow --      Pain Score 12/22/20 1817 8     Pain Loc --      Pain Edu? --      Excl. in [place]? --    No data found.  Updated Vital Signs BP (!) 151/94 (BP Location: Right Arm)   Pulse 86   Temp 98.1 F (36.7 C) (Oral)   Resp 19   LMP  (LMP Unknown)   SpO2 100%   Visual Acuity Right Eye Distance:   Left Eye Distance:   Bilateral Distance:    Right Eye Near:   Left Eye Near:    Bilateral Near:     Physical Exam Vitals reviewed.  Constitutional:      General: She is awake. She is not in acute distress.    Appearance: Normal appearance. She is normal weight. She is not ill-appearing.     Comments: Very pleasant female appears stated age in no acute distress  HENT:     Head: Normocephalic and atraumatic.  Cardiovascular:     Rate and Rhythm: Normal rate and regular rhythm.     Heart sounds: Normal heart sounds, S1 normal and S2 normal. No murmur heard. Pulmonary:     Effort: Pulmonary effort is normal.     Breath sounds: Normal breath sounds. No wheezing, rhonchi or rales.     Comments: Clear to auscultation bilaterally Abdominal:     General: Bowel sounds are normal.     Palpations: Abdomen is soft.     Tenderness: There is no abdominal tenderness. There is  no right CVA tenderness, left CVA tenderness, guarding or rebound.  Musculoskeletal:     Right shoulder: Tenderness present. No swelling or bony tenderness. Normal range of motion.     Cervical back: Spasms and tenderness present. No bony tenderness. Decreased range of motion.     Thoracic back: Tenderness present. No bony tenderness.     Lumbar back: No tenderness or bony tenderness.     Comments: Neck: Significant tenderness to palpation along right  trapezius.  Area of swelling noted right occiput.  Decreased range of motion with rotation secondary to pain.  Normal flexion and extension.  No pain percussion of vertebrae.  No deformity noted.  Strength 5/5 bilateral upper and lower extremities.  Normal pincer and grip strength.   Psychiatric:        Behavior: Behavior is cooperative.     UC Treatments / Results  Labs (all labs ordered are listed, but only abnormal results are displayed) Labs Reviewed - No data to display  EKG   Radiology No results found.  Procedures Procedures (including critical care time)  Medications Ordered in UC Medications  ketorolac (TORADOL) 30 MG/ML injection 30 mg (30 mg Intramuscular Given 12/22/20 1856)    Initial Impression / Assessment and Plan / UC Course  I have reviewed the triage vital signs and the nursing notes.  Pertinent labs & imaging results that were available during my care of the patient were reviewed by me and considered in my medical decision making (see chart for details).     Initially concern for possible abscess versus muscle spasm.  No significant erythema or warmth so we will treat for musculoskeletal etiology.  Patient was given injection of Toradol in clinic and instructed to start Naprosyn tomorrow.  She was instructed to take NSAIDs with this medication due to risk of GI bleeding.  She was prescribed Flexeril to be used up to twice a day as needed with instruction not to drive or drink alcohol with this medication due to risk of GI bleeding.  Encouraged her to use heat, rest, stretch for symptom relief.  Discussed that if symptoms persist she would need to consider MRI which need to be arranged through her primary care provider.  Discussed signs or symptoms that warrant emergent evaluation.  Strict return precautions given to patient expressed understanding.  Patient was discussed and seen with Dr. Lanny Cramp who agreed with treatment plan.   Final Clinical Impressions(s) /  UC Diagnoses   Final diagnoses:  Neck pain  Cervical radiculopathy  Right arm pain  Numbness and tingling of right arm     Discharge Instructions      We are concerned for a muscle strain.  Please take Naprosyn twice daily with food.  This is an anti-inflammatory so should not take additional NSAIDs with that including aspirin, ibuprofen/Advil, naproxen/Aleve.  Take Flexeril up to twice a day as needed for muscle pain.  This will make you sleepy do not drive or drink alcohol while taking it.  Use a heating pad but alternate use versus rest every 10 minutes for several cycles.  Please follow-up with your primary care provider soon as possible as you may need an MRI if symptoms do not improve.  If you have any changing or worsening symptoms please return for reevaluation.     ED Prescriptions     Medication Sig Dispense Auth. Provider   naproxen (NAPROSYN) 500 MG tablet Take 1 tablet (500 mg total) by mouth 2 (two) times daily. 30 tablet  Briauna Gilmartin K, PA-C   cyclobenzaprine (FLEXERIL) 10 MG tablet Take 1 tablet (10 mg total) by mouth 2 (two) times daily as needed for muscle spasms. 20 tablet Iren Whipp, Derry Skill, PA-C      PDMP not reviewed this encounter.   Terrilee Croak, PA-C 12/22/20 1916

## 2020-12-22 NOTE — Discharge Instructions (Addendum)
We are concerned for a muscle strain.  Please take Naprosyn twice daily with food.  This is an anti-inflammatory so should not take additional NSAIDs with that including aspirin, ibuprofen/Advil, naproxen/Aleve.  Take Flexeril up to twice a day as needed for muscle pain.  This will make you sleepy do not drive or drink alcohol while taking it.  Use a heating pad but alternate use versus rest every 10 minutes for several cycles.  Please follow-up with your primary care provider soon as possible as you may need an MRI if symptoms do not improve.  If you have any changing or worsening symptoms please return for reevaluation.

## 2020-12-23 DIAGNOSIS — M5412 Radiculopathy, cervical region: Secondary | ICD-10-CM | POA: Diagnosis not present

## 2020-12-25 ENCOUNTER — Other Ambulatory Visit: Payer: Self-pay | Admitting: Sports Medicine

## 2020-12-25 DIAGNOSIS — M542 Cervicalgia: Secondary | ICD-10-CM

## 2020-12-25 DIAGNOSIS — M5106 Intervertebral disc disorders with myelopathy, lumbar region: Secondary | ICD-10-CM | POA: Diagnosis not present

## 2020-12-31 ENCOUNTER — Ambulatory Visit
Admission: RE | Admit: 2020-12-31 | Discharge: 2020-12-31 | Disposition: A | Discharge status: Home / Self Care | Payer: Medicaid Other | Source: Ambulatory Visit | Attending: Sports Medicine | Admitting: Sports Medicine

## 2020-12-31 DIAGNOSIS — M4802 Spinal stenosis, cervical region: Secondary | ICD-10-CM | POA: Diagnosis not present

## 2020-12-31 DIAGNOSIS — M542 Cervicalgia: Secondary | ICD-10-CM

## 2021-01-03 DIAGNOSIS — M5106 Intervertebral disc disorders with myelopathy, lumbar region: Secondary | ICD-10-CM | POA: Diagnosis not present

## 2021-01-08 DIAGNOSIS — M5000 Cervical disc disorder with myelopathy, unspecified cervical region: Secondary | ICD-10-CM | POA: Diagnosis not present

## 2021-01-08 DIAGNOSIS — Z6841 Body Mass Index (BMI) 40.0 and over, adult: Secondary | ICD-10-CM | POA: Diagnosis not present

## 2021-01-08 DIAGNOSIS — I10 Essential (primary) hypertension: Secondary | ICD-10-CM | POA: Diagnosis not present

## 2021-01-10 ENCOUNTER — Other Ambulatory Visit: Payer: Self-pay | Admitting: Neurosurgery

## 2021-01-14 ENCOUNTER — Other Ambulatory Visit: Payer: Self-pay | Admitting: Neurosurgery

## 2021-01-15 ENCOUNTER — Encounter (HOSPITAL_COMMUNITY): Payer: Self-pay | Admitting: Neurosurgery

## 2021-01-15 MED ORDER — VANCOMYCIN HCL 1500 MG/300ML IV SOLN
1500.0000 mg | INTRAVENOUS | Status: AC
  Administered 2021-01-16: 1500 mg via INTRAVENOUS
  Filled 2021-01-15 (×2): qty 300

## 2021-01-15 NOTE — Progress Notes (Signed)
DUE TO COVID-19 ONLY ONE VISITOR IS ALLOWED TO COME WITH YOU AND STAY IN THE WAITING ROOM ONLY DURING PRE OP AND PROCEDURE DAY OF SURGERY. Two  VISITORS  MAY VISIT WITH YOU AFTER SURGERY IN YOUR PRIVATE ROOM DURING VISITING HOURS ONLY!  PCP - None Cardiologist - n/a  Chest x-ray - n/a EKG - 05/31/20 Stress Test - n/a ECHO - n/a Cardiac Cath - n/a  Sleep Study -  Yes CPAP - does not use cpap  Fasting Blood Sugar - 90s-100s Checks Blood Sugar 2 times a day  Do not take glipizide on the morning of surgery.  THE NIGHT BEFORE SURGERY, take 19 units of Levemir insulin.      THE MORNING OF SURGERY, take 19 units of Levemir insulin.  If your blood sugar is less than 70 mg/dL, you will need to treat for low blood sugar: Treat a low blood sugar (less than 70 mg/dL) with  cup of clear juice (cranberry or apple), 4 glucose tablets, OR glucose gel. Recheck blood sugar in 15 minutes after treatment (to make sure it is greater than 70 mg/dL). If your blood sugar is not greater than 70 mg/dL on recheck, call [phone] for further instructions.  ERAS: Clear liquids til 11:00 am on DOS.  Anesthesia review: Yes  STOP now taking any Aspirin (unless otherwise instructed by your surgeon), Aleve, Naproxen, Ibuprofen, Motrin, Advil, Goody's, BC's, all herbal medications, fish oil, and all vitamins.   Coronavirus Screening Covid test is scheduled on DOS 01/16/21 Do you have any of the following symptoms:  Cough yes/no: No Fever (>100.23F)  yes/no: No Runny nose yes/no: No Sore throat yes/no: No Difficulty breathing/shortness of breath  yes/no: No  Have you traveled in the last 14 days and where? yes/no: No  Patient verbalized understanding of instructions that were given via phone.

## 2021-01-16 ENCOUNTER — Ambulatory Visit (HOSPITAL_COMMUNITY): Payer: Medicaid Other | Admitting: Vascular Surgery

## 2021-01-16 ENCOUNTER — Encounter (HOSPITAL_COMMUNITY): Payer: Self-pay | Admitting: Neurosurgery

## 2021-01-16 ENCOUNTER — Encounter (HOSPITAL_COMMUNITY)
Admission: RE | Admit: 2021-01-16 | Discharge: 2021-01-16 | Disposition: A | Discharge status: Home / Self Care | Payer: Medicaid Other | Source: Ambulatory Visit | Attending: Neurosurgery | Admitting: Neurosurgery

## 2021-01-16 ENCOUNTER — Observation Stay (HOSPITAL_COMMUNITY)
Admission: RE | Admit: 2021-01-16 | Discharge: 2021-01-17 | Disposition: A | Discharge status: Home / Self Care | Payer: Medicaid Other | Attending: Neurosurgery | Admitting: Neurosurgery

## 2021-01-16 ENCOUNTER — Ambulatory Visit (HOSPITAL_COMMUNITY): Payer: Medicaid Other | Admitting: Certified Registered"

## 2021-01-16 ENCOUNTER — Encounter (HOSPITAL_COMMUNITY)
Admission: RE | Disposition: A | Discharge status: Home / Self Care | Payer: Self-pay | Source: Home / Self Care | Attending: Neurosurgery

## 2021-01-16 ENCOUNTER — Ambulatory Visit (HOSPITAL_COMMUNITY): Payer: Medicaid Other

## 2021-01-16 ENCOUNTER — Other Ambulatory Visit: Payer: Self-pay

## 2021-01-16 DIAGNOSIS — F17210 Nicotine dependence, cigarettes, uncomplicated: Secondary | ICD-10-CM | POA: Diagnosis not present

## 2021-01-16 DIAGNOSIS — M50122 Cervical disc disorder at C5-C6 level with radiculopathy: Secondary | ICD-10-CM | POA: Insufficient documentation

## 2021-01-16 DIAGNOSIS — M50022 Cervical disc disorder at C5-C6 level with myelopathy: Secondary | ICD-10-CM | POA: Insufficient documentation

## 2021-01-16 DIAGNOSIS — M4722 Other spondylosis with radiculopathy, cervical region: Secondary | ICD-10-CM | POA: Insufficient documentation

## 2021-01-16 DIAGNOSIS — M4802 Spinal stenosis, cervical region: Secondary | ICD-10-CM | POA: Insufficient documentation

## 2021-01-16 DIAGNOSIS — E039 Hypothyroidism, unspecified: Secondary | ICD-10-CM | POA: Insufficient documentation

## 2021-01-16 DIAGNOSIS — Z981 Arthrodesis status: Secondary | ICD-10-CM | POA: Diagnosis not present

## 2021-01-16 DIAGNOSIS — Z79899 Other long term (current) drug therapy: Secondary | ICD-10-CM | POA: Insufficient documentation

## 2021-01-16 DIAGNOSIS — Z20822 Contact with and (suspected) exposure to covid-19: Secondary | ICD-10-CM | POA: Insufficient documentation

## 2021-01-16 DIAGNOSIS — E1142 Type 2 diabetes mellitus with diabetic polyneuropathy: Secondary | ICD-10-CM | POA: Diagnosis not present

## 2021-01-16 DIAGNOSIS — M4712 Other spondylosis with myelopathy, cervical region: Secondary | ICD-10-CM | POA: Diagnosis not present

## 2021-01-16 DIAGNOSIS — Z794 Long term (current) use of insulin: Secondary | ICD-10-CM | POA: Insufficient documentation

## 2021-01-16 DIAGNOSIS — I10 Essential (primary) hypertension: Secondary | ICD-10-CM | POA: Diagnosis not present

## 2021-01-16 DIAGNOSIS — Z7984 Long term (current) use of oral hypoglycemic drugs: Secondary | ICD-10-CM | POA: Insufficient documentation

## 2021-01-16 DIAGNOSIS — M4322 Fusion of spine, cervical region: Secondary | ICD-10-CM | POA: Diagnosis not present

## 2021-01-16 DIAGNOSIS — J45909 Unspecified asthma, uncomplicated: Secondary | ICD-10-CM | POA: Diagnosis not present

## 2021-01-16 DIAGNOSIS — E119 Type 2 diabetes mellitus without complications: Secondary | ICD-10-CM | POA: Insufficient documentation

## 2021-01-16 DIAGNOSIS — Z419 Encounter for procedure for purposes other than remedying health state, unspecified: Secondary | ICD-10-CM

## 2021-01-16 DIAGNOSIS — G992 Myelopathy in diseases classified elsewhere: Secondary | ICD-10-CM | POA: Diagnosis present

## 2021-01-16 HISTORY — DX: Hyperlipidemia, unspecified: E78.5

## 2021-01-16 HISTORY — PX: ANTERIOR CERVICAL DECOMP/DISCECTOMY FUSION: SHX1161

## 2021-01-16 HISTORY — DX: Headache, unspecified: R51.9

## 2021-01-16 HISTORY — DX: Anxiety disorder, unspecified: F41.9

## 2021-01-16 HISTORY — DX: Gastro-esophageal reflux disease without esophagitis: K21.9

## 2021-01-16 LAB — GLUCOSE, CAPILLARY
Glucose-Capillary: 210 mg/dL — ABNORMAL HIGH (ref 70–99)
Glucose-Capillary: 184 mg/dL — ABNORMAL HIGH (ref 70–99)
Glucose-Capillary: 220 mg/dL — ABNORMAL HIGH (ref 70–99)
Glucose-Capillary: 284 mg/dL — ABNORMAL HIGH (ref 70–99)

## 2021-01-16 LAB — CBC
HCT: 34.6 % — ABNORMAL LOW (ref 36.0–46.0)
Hemoglobin: 10.7 g/dL — ABNORMAL LOW (ref 12.0–15.0)
MCH: 25.7 pg — ABNORMAL LOW (ref 26.0–34.0)
MCHC: 30.9 g/dL (ref 30.0–36.0)
MCV: 83.2 fL (ref 80.0–100.0)
Platelets: 406 10*3/uL — ABNORMAL HIGH (ref 150–400)
RBC: 4.16 MIL/uL (ref 3.87–5.11)
RDW: 15.5 % (ref 11.5–15.5)
WBC: 4.9 10*3/uL (ref 4.0–10.5)
nRBC: 0 % (ref 0.0–0.2)

## 2021-01-16 LAB — BASIC METABOLIC PANEL
Anion gap: 10 (ref 5–15)
BUN: 7 mg/dL (ref 6–20)
CO2: 22 mmol/L (ref 22–32)
Calcium: 8.8 mg/dL — ABNORMAL LOW (ref 8.9–10.3)
Chloride: 102 mmol/L (ref 98–111)
Creatinine, Ser: 0.58 mg/dL (ref 0.44–1.00)
GFR, Estimated: >60 mL/min (ref >60)
Glucose, Bld: 216 mg/dL — ABNORMAL HIGH (ref 70–99)
Potassium: 3.7 mmol/L (ref 3.5–5.1)
Sodium: 134 mmol/L — ABNORMAL LOW (ref 135–145)

## 2021-01-16 LAB — HEMOGLOBIN A1C
Hgb A1c MFr Bld: 10.9 % — ABNORMAL HIGH (ref 4.8–5.6)
Mean Plasma Glucose: 266.13 mg/dL

## 2021-01-16 LAB — TYPE AND SCREEN
ABO/RH(D): B POS
Antibody Screen: NEGATIVE

## 2021-01-16 LAB — ABO/RH: ABO/RH(D): B POS

## 2021-01-16 LAB — SARS CORONAVIRUS 2 BY RT PCR (HOSPITAL ORDER, PERFORMED IN ~~LOC~~ HOSPITAL LAB): SARS Coronavirus 2: NEGATIVE

## 2021-01-16 LAB — SURGICAL PCR SCREEN
MRSA, PCR: NEGATIVE
Staphylococcus aureus: NEGATIVE

## 2021-01-16 LAB — POCT PREGNANCY, URINE: Preg Test, Ur: NEGATIVE

## 2021-01-16 SURGERY — ANTERIOR CERVICAL DECOMPRESSION/DISCECTOMY FUSION 1 LEVEL
Anesthesia: General | Site: Spine Cervical

## 2021-01-16 MED ORDER — DEXAMETHASONE SODIUM PHOSPHATE 10 MG/ML IJ SOLN
INTRAMUSCULAR | Status: DC | PRN
  Administered 2021-01-16: 5 mg via INTRAVENOUS

## 2021-01-16 MED ORDER — AMISULPRIDE (ANTIEMETIC) 5 MG/2ML IV SOLN: 10.0000 mg | Freq: Once | INTRAVENOUS | Status: DC | PRN

## 2021-01-16 MED ORDER — SODIUM CHLORIDE 0.9% FLUSH: 3.0000 mL | Freq: Two times a day (BID) | INTRAVENOUS | Status: DC

## 2021-01-16 MED ORDER — OXYCODONE HCL 5 MG PO TABS
10.0000 mg | ORAL_TABLET | ORAL | Status: DC | PRN
  Administered 2021-01-16 – 2021-01-17 (×3): 10 mg via ORAL
  Filled 2021-01-16 (×3): qty 2

## 2021-01-16 MED ORDER — ALBUTEROL SULFATE 0.63 MG/3ML IN NEBU: 1.0000 | INHALATION_SOLUTION | RESPIRATORY_TRACT | Status: DC | PRN

## 2021-01-16 MED ORDER — MENTHOL 3 MG MT LOZG: 1.0000 | LOZENGE | OROMUCOSAL | Status: DC | PRN

## 2021-01-16 MED ORDER — DIPHENHYDRAMINE HCL 25 MG PO CAPS
50.0000 mg | ORAL_CAPSULE | Freq: Every day | ORAL | Status: DC | PRN
  Administered 2021-01-17: 25 mg via ORAL
  Filled 2021-01-16 (×4): qty 2

## 2021-01-16 MED ORDER — PROPOFOL 10 MG/ML IV BOLUS
INTRAVENOUS | Status: DC | PRN
  Administered 2021-01-16: 200 mg via INTRAVENOUS

## 2021-01-16 MED ORDER — PROMETHAZINE HCL 25 MG/ML IJ SOLN: 6.2500 mg | INTRAMUSCULAR | Status: DC | PRN

## 2021-01-16 MED ORDER — CHLORHEXIDINE GLUCONATE CLOTH 2 % EX PADS: 6.0000 | MEDICATED_PAD | Freq: Once | CUTANEOUS | Status: DC

## 2021-01-16 MED ORDER — FENTANYL CITRATE (PF) 100 MCG/2ML IJ SOLN
INTRAMUSCULAR | Status: DC | PRN
  Administered 2021-01-16: 100 ug via INTRAVENOUS

## 2021-01-16 MED ORDER — METHOCARBAMOL 500 MG PO TABS
500.0000 mg | ORAL_TABLET | Freq: Four times a day (QID) | ORAL | Status: DC | PRN
  Administered 2021-01-16 (×2): 500 mg via ORAL
  Filled 2021-01-16 (×2): qty 1

## 2021-01-16 MED ORDER — SENNA 8.6 MG PO TABS
1.0000 | ORAL_TABLET | Freq: Two times a day (BID) | ORAL | Status: DC
  Administered 2021-01-16: 8.6 mg via ORAL
  Filled 2021-01-16 (×2): qty 1

## 2021-01-16 MED ORDER — BUPIVACAINE HCL 0.5 % IJ SOLN
INTRAMUSCULAR | Status: DC | PRN
  Administered 2021-01-16: 3.5 mL

## 2021-01-16 MED ORDER — SODIUM CHLORIDE 0.9 % IV SOLN: 250.0000 mL | INTRAVENOUS | Status: DC

## 2021-01-16 MED ORDER — PHENYLEPHRINE 40 MCG/ML (10ML) SYRINGE FOR IV PUSH (FOR BLOOD PRESSURE SUPPORT)
PREFILLED_SYRINGE | INTRAVENOUS | Status: AC
  Filled 2021-01-16: qty 10

## 2021-01-16 MED ORDER — ATORVASTATIN CALCIUM 80 MG PO TABS
80.0000 mg | ORAL_TABLET | Freq: Every day | ORAL | Status: DC
  Administered 2021-01-16: 80 mg via ORAL
  Filled 2021-01-16: qty 1

## 2021-01-16 MED ORDER — 0.9 % SODIUM CHLORIDE (POUR BTL) OPTIME
TOPICAL | Status: DC | PRN
  Administered 2021-01-16: 1000 mL

## 2021-01-16 MED ORDER — THROMBIN 5000 UNITS EX SOLR
OROMUCOSAL | Status: DC | PRN
  Administered 2021-01-16: 5 mL via TOPICAL

## 2021-01-16 MED ORDER — ROCURONIUM BROMIDE 10 MG/ML (PF) SYRINGE
PREFILLED_SYRINGE | INTRAVENOUS | Status: AC
  Filled 2021-01-16: qty 10

## 2021-01-16 MED ORDER — PHENOL 1.4 % MT LIQD: 1.0000 | OROMUCOSAL | Status: DC | PRN

## 2021-01-16 MED ORDER — ROCURONIUM BROMIDE 10 MG/ML (PF) SYRINGE
PREFILLED_SYRINGE | INTRAVENOUS | Status: DC | PRN
  Administered 2021-01-16: 40 mg via INTRAVENOUS
  Administered 2021-01-16: 60 mg via INTRAVENOUS
  Administered 2021-01-16: 40 mg via INTRAVENOUS

## 2021-01-16 MED ORDER — VANCOMYCIN HCL 1500 MG/300ML IV SOLN
1500.0000 mg | Freq: Once | INTRAVENOUS | Status: AC
  Administered 2021-01-16: 1500 mg via INTRAVENOUS
  Filled 2021-01-16 (×2): qty 300

## 2021-01-16 MED ORDER — POLYETHYLENE GLYCOL 3350 17 G PO PACK: 17.0000 g | PACK | Freq: Every day | ORAL | Status: DC | PRN

## 2021-01-16 MED ORDER — GLIPIZIDE ER 10 MG PO TB24
10.0000 mg | ORAL_TABLET | Freq: Every day | ORAL | Status: DC
  Administered 2021-01-17: 10 mg via ORAL
  Filled 2021-01-16: qty 1

## 2021-01-16 MED ORDER — LIDOCAINE-EPINEPHRINE 1 %-1:100000 IJ SOLN
INTRAMUSCULAR | Status: DC | PRN
  Administered 2021-01-16: 3.5 mL

## 2021-01-16 MED ORDER — INSULIN DETEMIR 100 UNIT/ML ~~LOC~~ SOLN
38.0000 [IU] | Freq: Two times a day (BID) | SUBCUTANEOUS | Status: DC
  Administered 2021-01-16 – 2021-01-17 (×2): 38 [IU] via SUBCUTANEOUS
  Filled 2021-01-16 (×3): qty 0.38

## 2021-01-16 MED ORDER — SUGAMMADEX SODIUM 500 MG/5ML IV SOLN
INTRAVENOUS | Status: AC
  Filled 2021-01-16: qty 5

## 2021-01-16 MED ORDER — ORAL CARE MOUTH RINSE: 15.0000 mL | Freq: Once | OROMUCOSAL | Status: AC

## 2021-01-16 MED ORDER — THROMBIN 5000 UNITS EX SOLR
CUTANEOUS | Status: AC
  Filled 2021-01-16: qty 10000

## 2021-01-16 MED ORDER — INSULIN ASPART 100 UNIT/ML IJ SOLN
0.0000 [IU] | Freq: Three times a day (TID) | INTRAMUSCULAR | Status: DC
  Administered 2021-01-17 (×2): 11 [IU] via SUBCUTANEOUS

## 2021-01-16 MED ORDER — MORPHINE SULFATE (PF) 4 MG/ML IV SOLN
4.0000 mg | INTRAVENOUS | Status: DC | PRN
  Administered 2021-01-16 – 2021-01-17 (×3): 4 mg via INTRAVENOUS
  Filled 2021-01-16 (×3): qty 1

## 2021-01-16 MED ORDER — SODIUM CHLORIDE 0.9 % IV SOLN: INTRAVENOUS | Status: DC

## 2021-01-16 MED ORDER — ALBUTEROL SULFATE HFA 108 (90 BASE) MCG/ACT IN AERS: 1.0000 | INHALATION_SPRAY | Freq: Four times a day (QID) | RESPIRATORY_TRACT | Status: DC | PRN

## 2021-01-16 MED ORDER — ALBUTEROL SULFATE (2.5 MG/3ML) 0.083% IN NEBU: 2.5000 mg | INHALATION_SOLUTION | Freq: Four times a day (QID) | RESPIRATORY_TRACT | Status: DC | PRN

## 2021-01-16 MED ORDER — ONDANSETRON HCL 4 MG/2ML IJ SOLN: 4.0000 mg | Freq: Four times a day (QID) | INTRAMUSCULAR | Status: DC | PRN

## 2021-01-16 MED ORDER — ACETAMINOPHEN 325 MG PO TABS: 325.0000 mg | ORAL_TABLET | Freq: Once | ORAL | Status: DC | PRN

## 2021-01-16 MED ORDER — HYDROMORPHONE HCL 1 MG/ML IJ SOLN
INTRAMUSCULAR | Status: AC
  Filled 2021-01-16: qty 1

## 2021-01-16 MED ORDER — SODIUM CHLORIDE 0.9% FLUSH: 3.0000 mL | INTRAVENOUS | Status: DC | PRN

## 2021-01-16 MED ORDER — PROPOFOL 10 MG/ML IV BOLUS
INTRAVENOUS | Status: AC
  Filled 2021-01-16: qty 20

## 2021-01-16 MED ORDER — METHOCARBAMOL 500 MG PO TABS
ORAL_TABLET | ORAL | Status: AC
  Filled 2021-01-16: qty 1

## 2021-01-16 MED ORDER — CEFAZOLIN SODIUM-DEXTROSE 2-4 GM/100ML-% IV SOLN: 2.0000 g | Freq: Three times a day (TID) | INTRAVENOUS | Status: DC

## 2021-01-16 MED ORDER — INSULIN DETEMIR 100 UNIT/ML FLEXPEN: 38.0000 [IU] | PEN_INJECTOR | Freq: Two times a day (BID) | SUBCUTANEOUS | Status: DC

## 2021-01-16 MED ORDER — METHOCARBAMOL 1000 MG/10ML IJ SOLN
500.0000 mg | Freq: Four times a day (QID) | INTRAVENOUS | Status: DC | PRN
  Filled 2021-01-16: qty 5

## 2021-01-16 MED ORDER — ONDANSETRON HCL 4 MG/2ML IJ SOLN
INTRAMUSCULAR | Status: DC | PRN
  Administered 2021-01-16: 4 mg via INTRAVENOUS

## 2021-01-16 MED ORDER — CYCLOBENZAPRINE HCL 10 MG PO TABS: 10.0000 mg | ORAL_TABLET | Freq: Two times a day (BID) | ORAL | Status: DC | PRN

## 2021-01-16 MED ORDER — HYDROMORPHONE HCL 1 MG/ML IJ SOLN
0.2500 mg | INTRAMUSCULAR | Status: DC | PRN
  Administered 2021-01-16 (×2): 0.5 mg via INTRAVENOUS

## 2021-01-16 MED ORDER — ONDANSETRON HCL 4 MG/2ML IJ SOLN
INTRAMUSCULAR | Status: AC
  Filled 2021-01-16: qty 2

## 2021-01-16 MED ORDER — PHENYLEPHRINE HCL-NACL 10-0.9 MG/250ML-% IV SOLN
INTRAVENOUS | Status: DC | PRN
  Administered 2021-01-16: 15 ug/min via INTRAVENOUS

## 2021-01-16 MED ORDER — CHLORHEXIDINE GLUCONATE 0.12 % MT SOLN
15.0000 mL | Freq: Once | OROMUCOSAL | Status: AC
  Administered 2021-01-16: 15 mL via OROMUCOSAL
  Filled 2021-01-16: qty 15

## 2021-01-16 MED ORDER — PANTOPRAZOLE SODIUM 40 MG IV SOLR
40.0000 mg | Freq: Every day | INTRAVENOUS | Status: DC
  Administered 2021-01-16: 40 mg via INTRAVENOUS
  Filled 2021-01-16: qty 40

## 2021-01-16 MED ORDER — PHENYLEPHRINE 40 MCG/ML (10ML) SYRINGE FOR IV PUSH (FOR BLOOD PRESSURE SUPPORT)
PREFILLED_SYRINGE | INTRAVENOUS | Status: DC | PRN
  Administered 2021-01-16: 120 ug via INTRAVENOUS
  Administered 2021-01-16: 80 ug via INTRAVENOUS

## 2021-01-16 MED ORDER — TRIAMTERENE-HCTZ 37.5-25 MG PO TABS
1.0000 | ORAL_TABLET | Freq: Every day | ORAL | Status: DC
  Administered 2021-01-17: 1 via ORAL
  Filled 2021-01-16 (×2): qty 1

## 2021-01-16 MED ORDER — FLUTICASONE PROPIONATE 50 MCG/ACT NA SUSP
1.0000 | Freq: Every day | NASAL | Status: DC
  Administered 2021-01-17: 1 via NASAL
  Filled 2021-01-16: qty 16

## 2021-01-16 MED ORDER — LIDOCAINE HCL (PF) 2 % IJ SOLN
INTRAMUSCULAR | Status: DC | PRN
  Administered 2021-01-16: 40 mg via INTRADERMAL
  Administered 2021-01-16: 60 mg via INTRADERMAL

## 2021-01-16 MED ORDER — LORATADINE 10 MG PO TABS
10.0000 mg | ORAL_TABLET | Freq: Every day | ORAL | Status: DC
  Administered 2021-01-16 – 2021-01-17 (×2): 10 mg via ORAL
  Filled 2021-01-16 (×2): qty 1

## 2021-01-16 MED ORDER — BISACODYL 10 MG RE SUPP: 10.0000 mg | Freq: Every day | RECTAL | Status: DC | PRN

## 2021-01-16 MED ORDER — MEPERIDINE HCL 25 MG/ML IJ SOLN: 6.2500 mg | INTRAMUSCULAR | Status: DC | PRN

## 2021-01-16 MED ORDER — ACETAMINOPHEN 10 MG/ML IV SOLN
INTRAVENOUS | Status: AC
  Filled 2021-01-16: qty 100

## 2021-01-16 MED ORDER — FLEET ENEMA 7-19 GM/118ML RE ENEM: 1.0000 | ENEMA | Freq: Once | RECTAL | Status: DC | PRN

## 2021-01-16 MED ORDER — LIDOCAINE 2% (20 MG/ML) 5 ML SYRINGE
INTRAMUSCULAR | Status: AC
  Filled 2021-01-16: qty 5

## 2021-01-16 MED ORDER — DEXAMETHASONE SODIUM PHOSPHATE 10 MG/ML IJ SOLN
INTRAMUSCULAR | Status: AC
  Filled 2021-01-16: qty 1

## 2021-01-16 MED ORDER — LIDOCAINE-EPINEPHRINE 1 %-1:100000 IJ SOLN
INTRAMUSCULAR | Status: AC
  Filled 2021-01-16: qty 1

## 2021-01-16 MED ORDER — DOCUSATE SODIUM 100 MG PO CAPS
100.0000 mg | ORAL_CAPSULE | Freq: Two times a day (BID) | ORAL | Status: DC
  Administered 2021-01-16 – 2021-01-17 (×2): 100 mg via ORAL
  Filled 2021-01-16 (×2): qty 1

## 2021-01-16 MED ORDER — ONDANSETRON HCL 4 MG PO TABS: 4.0000 mg | ORAL_TABLET | Freq: Four times a day (QID) | ORAL | Status: DC | PRN

## 2021-01-16 MED ORDER — SUGAMMADEX SODIUM 200 MG/2ML IV SOLN
INTRAVENOUS | Status: DC | PRN
  Administered 2021-01-16: 300 mg via INTRAVENOUS

## 2021-01-16 MED ORDER — ACETAMINOPHEN 10 MG/ML IV SOLN
1000.0000 mg | Freq: Once | INTRAVENOUS | Status: DC | PRN
  Administered 2021-01-16: 1000 mg via INTRAVENOUS

## 2021-01-16 MED ORDER — ACETAMINOPHEN 160 MG/5ML PO SOLN
325.0000 mg | Freq: Once | ORAL | Status: DC | PRN
Start: 2021-01-16 — End: 2021-01-16

## 2021-01-16 MED ORDER — LINAGLIPTIN 5 MG PO TABS
5.0000 mg | ORAL_TABLET | Freq: Every day | ORAL | Status: DC
  Administered 2021-01-17: 5 mg via ORAL
  Filled 2021-01-16: qty 1

## 2021-01-16 MED ORDER — AMLODIPINE BESYLATE 5 MG PO TABS
5.0000 mg | ORAL_TABLET | Freq: Every day | ORAL | Status: DC
  Administered 2021-01-16 – 2021-01-17 (×2): 5 mg via ORAL
  Filled 2021-01-16 (×2): qty 1

## 2021-01-16 MED ORDER — ACETAMINOPHEN 650 MG RE SUPP: 650.0000 mg | RECTAL | Status: DC | PRN

## 2021-01-16 MED ORDER — ACETAMINOPHEN 325 MG PO TABS
650.0000 mg | ORAL_TABLET | ORAL | Status: DC | PRN
  Administered 2021-01-16 – 2021-01-17 (×2): 650 mg via ORAL
  Filled 2021-01-16 (×2): qty 2

## 2021-01-16 MED ORDER — LACTATED RINGERS IV SOLN: INTRAVENOUS | Status: DC

## 2021-01-16 MED ORDER — OXYCODONE HCL 5 MG PO TABS: 5.0000 mg | ORAL_TABLET | ORAL | Status: DC | PRN

## 2021-01-16 MED ORDER — BUPIVACAINE HCL (PF) 0.5 % IJ SOLN
INTRAMUSCULAR | Status: AC
  Filled 2021-01-16: qty 30

## 2021-01-16 MED ORDER — FENTANYL CITRATE (PF) 250 MCG/5ML IJ SOLN
INTRAMUSCULAR | Status: AC
  Filled 2021-01-16: qty 5

## 2021-01-16 SURGICAL SUPPLY — 59 items
BAG COUNTER SPONGE SURGICOUNT (BAG) ×2 IMPLANT
BAND RUBBER #18 3X1/16 STRL (MISCELLANEOUS) ×4 IMPLANT
BENZOIN TINCTURE PRP APPL 2/3 (GAUZE/BANDAGES/DRESSINGS) IMPLANT
BIT DRILL 13 (BIT) ×2 IMPLANT
BLADE CLIPPER SURG (BLADE) IMPLANT
BLADE SURG 11 STRL SS (BLADE) ×2 IMPLANT
BLADE ULTRA TIP 2M (BLADE) IMPLANT
BUR MATCHSTICK NEURO 3.0 LAGG (BURR) ×2 IMPLANT
CAGE LORDOTIC 6 SM (Cage) ×2 IMPLANT
CANISTER SUCT 3000ML PPV (MISCELLANEOUS) ×2 IMPLANT
CARTRIDGE OIL MAESTRO DRILL (MISCELLANEOUS) ×1 IMPLANT
DECANTER SPIKE VIAL GLASS SM (MISCELLANEOUS) ×2 IMPLANT
DERMABOND ADVANCED (GAUZE/BANDAGES/DRESSINGS) ×1
DERMABOND ADVANCED .7 DNX12 (GAUZE/BANDAGES/DRESSINGS) ×1 IMPLANT
DIFFUSER DRILL AIR PNEUMATIC (MISCELLANEOUS) ×2 IMPLANT
DRAPE C-ARM 42X72 X-RAY (DRAPES) ×4 IMPLANT
DRAPE HALF SHEET 40X57 (DRAPES) IMPLANT
DRAPE LAPAROTOMY 100X72 PEDS (DRAPES) ×2 IMPLANT
DRAPE MICROSCOPE LEICA (MISCELLANEOUS) ×2 IMPLANT
DRSG OPSITE 4X5.5 SM (GAUZE/BANDAGES/DRESSINGS) ×4 IMPLANT
DRSG OPSITE POSTOP 4X6 (GAUZE/BANDAGES/DRESSINGS) ×2 IMPLANT
DURAPREP 6ML APPLICATOR 50/CS (WOUND CARE) ×2 IMPLANT
ELECT COATED BLADE 2.86 ST (ELECTRODE) ×2 IMPLANT
ELECT REM PT RETURN 9FT ADLT (ELECTROSURGICAL) ×2
ELECTRODE REM PT RTRN 9FT ADLT (ELECTROSURGICAL) ×1 IMPLANT
GAUZE 4X4 16PLY ~~LOC~~+RFID DBL (SPONGE) IMPLANT
GLOVE EXAM NITRILE XL STR (GLOVE) IMPLANT
GLOVE SURG ENC MOIS LTX SZ7.5 (GLOVE) IMPLANT
GLOVE SURG LTX SZ7 (GLOVE) ×2 IMPLANT
GLOVE SURG UNDER POLY LF SZ7 (GLOVE) ×4 IMPLANT
GLOVE SURG UNDER POLY LF SZ7.5 (GLOVE) ×4 IMPLANT
GOWN STRL REUS W/ TWL LRG LVL3 (GOWN DISPOSABLE) ×3 IMPLANT
GOWN STRL REUS W/ TWL XL LVL3 (GOWN DISPOSABLE) IMPLANT
GOWN STRL REUS W/TWL 2XL LVL3 (GOWN DISPOSABLE) IMPLANT
GOWN STRL REUS W/TWL LRG LVL3 (GOWN DISPOSABLE) ×3
GOWN STRL REUS W/TWL XL LVL3 (GOWN DISPOSABLE)
HEMOSTAT POWDER KIT SURGIFOAM (HEMOSTASIS) ×2 IMPLANT
KIT BASIN OR (CUSTOM PROCEDURE TRAY) ×2 IMPLANT
KIT TURNOVER KIT B (KITS) ×2 IMPLANT
NEEDLE HYPO 22GX1.5 SAFETY (NEEDLE) ×2 IMPLANT
NEEDLE SPNL 18GX3.5 QUINCKE PK (NEEDLE) ×2 IMPLANT
NEEDLE SPNL 22GX3.5 QUINCKE BK (NEEDLE) ×2 IMPLANT
NS IRRIG 1000ML POUR BTL (IV SOLUTION) ×2 IMPLANT
OIL CARTRIDGE MAESTRO DRILL (MISCELLANEOUS) ×2
PACK LAMINECTOMY NEURO (CUSTOM PROCEDURE TRAY) ×2 IMPLANT
PAD ARMBOARD 7.5X6 YLW CONV (MISCELLANEOUS) ×6 IMPLANT
PLATE ZEVO 1LVL 17MM (Plate) ×2 IMPLANT
PUTTY DBF 1CC CORTICAL FIBERS (Putty) ×2 IMPLANT
SCREW 13MM (Screw) ×8 IMPLANT
SPONGE INTESTINAL PEANUT (DISPOSABLE) ×2 IMPLANT
SPONGE SURGIFOAM ABS GEL SZ50 (HEMOSTASIS) IMPLANT
STAPLER VISISTAT 35W (STAPLE) ×2 IMPLANT
STRIP CLOSURE SKIN 1/2X4 (GAUZE/BANDAGES/DRESSINGS) IMPLANT
SUT VIC AB 3-0 SH 8-18 (SUTURE) ×2 IMPLANT
SUT VICRYL 3-0 RB1 18 ABS (SUTURE) ×4 IMPLANT
TAPE CLOTH 3X10 TAN LF (GAUZE/BANDAGES/DRESSINGS) ×2 IMPLANT
TOWEL GREEN STERILE (TOWEL DISPOSABLE) ×2 IMPLANT
TOWEL GREEN STERILE FF (TOWEL DISPOSABLE) ×2 IMPLANT
WATER STERILE IRR 1000ML POUR (IV SOLUTION) ×2 IMPLANT

## 2021-01-16 NOTE — Anesthesia Procedure Notes (Signed)
Procedure Name: Intubation Date/Time: 01/16/2021 2:45 PM Performed by: Barrington Ellison, CRNA Pre-anesthesia Checklist: Patient identified, Emergency Drugs available, Suction available and Patient being monitored Patient Re-evaluated:Patient Re-evaluated prior to induction Oxygen Delivery Method: Circle System Utilized Preoxygenation: Pre-oxygenation with 100% oxygen Induction Type: IV induction Ventilation: Mask ventilation with difficulty, Two handed mask ventilation required and Oral airway inserted - appropriate to patient size Laryngoscope Size: Glidescope and 3 Grade View: Grade I Tube type: Oral Tube size: 7.0 mm Number of attempts: 1 Airway Equipment and Method: Stylet Placement Confirmation: ETT inserted through vocal cords under direct vision, positive ETCO2 and breath sounds checked- equal and bilateral Secured at: 23 cm Tube secured with: Tape Dental Injury: Teeth and Oropharynx as per pre-operative assessment  Comments: Intubation by Victorino Sparrow.

## 2021-01-16 NOTE — Transfer of Care (Signed)
Immediate Anesthesia Transfer of Care Note  Patient: Ruth [name]  Procedure(s) Performed: Cervical five-six Anterior cervical decompression/discectomy/fusion (Spine Cervical)  Patient Location: PACU  Anesthesia Type:General  Level of Consciousness: drowsy  Airway & Oxygen Therapy: Patient Spontanous Breathing and Patient connected to face mask oxygen  Post-op Assessment: Report given to RN, Post -op Vital signs reviewed and stable and Patient moving all extremities X 4  Post vital signs: Reviewed and stable  Last Vitals:  Vitals Value Taken Time  BP 148/96 01/16/21 1655  Temp 36.3 C 01/16/21 1655  Pulse 87 01/16/21 1657  Resp 23 01/16/21 1657  SpO2 98 % 01/16/21 1657  Vitals shown include unvalidated device data.  Last Pain:  Vitals:   01/16/21 1140  TempSrc: Oral  PainSc:       Patients Stated Pain Goal: 8 (37/90/24 0973)  Complications: No notable events documented.

## 2021-01-16 NOTE — H&P (Signed)
Chief Complaint   Neck and right arm pain  History of Present Illness  Ruth [name] is a 45 y.o. female initially presented to the outpatient neurosurgery clinic complaining of progressively worsening right-sided neck and arm pain.  She describes initially achy type pain progressing to more constant relatively severe pain extending from the right side of her neck through the right shoulder, arm, forearm, with some paresthesias in her hand.  She did describe subjective primarily right-sided grip strength weakness.  Her MRI revealed large acute disc herniation at C5-6 with spinal cord compression.  Surgical decompression was therefore recommended.  She presents today for surgery.  Past Medical History   Past Medical History:  Diagnosis Date   Anemia    Anxiety    Arthritis    chronic back pain    Asthma    rarely uses inhaler   Bipolar 1 disorder (HCC)    Fibromyalgia    GERD (gastroesophageal reflux disease)    Headache    otc med   HLD (hyperlipidemia)    diet controlled   Hx of migraines    occ   Hypertension    Hypothyroidism    Obesity    Sleep apnea    does not use  cpap   Thrombocytosis 01/26/2020   Type 2 diabetes mellitus ([place])    Type 2    Past Surgical History   Past Surgical History:  Procedure Laterality Date   CARPAL TUNNEL RELEASE Right    CESAREAN SECTION     x2   DILITATION & CURRETTAGE/HYSTROSCOPY WITH NOVASURE ABLATION N/A 12/07/2017   Procedure: DILATATION & CURETTAGE/HYSTEROSCOPY WITH ATTEMPED NOVASURE ABLATION, FAILED/ENDOMETERIAL ABLATION WITH HTA SYSTEM;  Surgeon: Lavonia Drafts, MD;  Location: [facility];  Service: Gynecology;  Laterality: N/A;   ELBOW SURGERY     "surgery for nerve pain"   GANGLION CYST EXCISION Right    right arm    RECTOVAGINAL FISTULA CLOSURE     after vag delivery   TONSILLECTOMY     childhood   TUBAL LIGATION      Social History   Social History   Tobacco Use   Smoking status: Every Day     Packs/day: 0.25    Types: Cigarettes   Smokeless tobacco: Never  Vaping Use   Vaping Use: Never used  Substance Use Topics   Alcohol use: Yes    Comment: occasionally   Drug use: Yes    Types: Marijuana    Comment: Last use on 01/11/21    Medications   Prior to Admission medications   Medication Sig Start Date End Date Taking? Authorizing Provider  albuterol (ACCUNEB) 0.63 MG/3ML nebulizer solution Take 3 mLs (0.63 mg total) by nebulization every 4 (four) hours as needed for wheezing or shortness of breath. 11/20/20  Yes Hawks, Christy A, FNP  amLODipine (NORVASC) 5 MG tablet Take 1 tablet (5 mg total) by mouth daily. 04/25/20  Yes Nicolette Bang, MD  atorvastatin (LIPITOR) 80 MG tablet Take 1 tablet (80 mg total) by mouth daily at 6 PM. 04/25/20  Yes Nicolette Bang, MD  cetirizine (ZYRTEC) 10 MG tablet Take 1 tablet (10 mg total) by mouth daily as needed for allergies. Patient taking differently: Take 10 mg by mouth daily. 11/20/20  Yes Hawks, Christy A, FNP  cyclobenzaprine (FLEXERIL) 10 MG tablet Take 1 tablet (10 mg total) by mouth 2 (two) times daily as needed for muscle spasms. 12/22/20  Yes Raspet, Erin K, PA-C  diphenhydrAMINE (BENADRYL) 50  MG tablet Take 1 tablet (50 mg total) by mouth at bedtime as needed for itching. Patient taking differently: Take 50 mg by mouth daily as needed for itching. 11/20/20  Yes Hawks, Christy A, FNP  fluticasone (FLONASE) 50 MCG/ACT nasal spray Place 1-2 sprays into both nostrils daily as needed for allergies. Patient taking differently: Place 1-2 sprays into both nostrils daily. 11/20/20  Yes Hawks, Christy A, FNP  glipiZIDE (GLUCOTROL XL) 10 MG 24 hr tablet Take 1 tablet (10 mg total) by mouth daily with breakfast. 04/25/20  Yes Nicolette Bang, MD  HYDROcodone-acetaminophen (NORCO/VICODIN) 5-325 MG tablet Take 1 tablet by mouth 2 (two) times daily as needed for moderate pain.   Yes [provider]  insulin  detemir (LEVEMIR) 100 UNIT/ML FlexPen Inject 38 Units into the skin 2 (two) times daily. 04/25/20  Yes Nicolette Bang, MD  sitaGLIPtin (JANUVIA) 100 MG tablet Take 1 tablet (100 mg total) by mouth daily. 04/25/20  Yes Nicolette Bang, MD  triamterene-hydrochlorothiazide (MAXZIDE-25) 37.5-25 MG tablet Take 1 tablet by mouth daily. 04/25/20  Yes Nicolette Bang, MD  Accu-Chek FastClix Lancets MISC E1 1.65, FOUR BLOOD GLUCOSE TESTING FASTING ANND TWO HOURS AFTER SUPPER 07/01/18   [provider]  ACCU-CHEK GUIDE test strip CHECK BLOOD SUGARS TWICE, E11.65 12/21/18   [provider]  albuterol (VENTOLIN HFA) 108 (90 Base) MCG/ACT inhaler Inhale 1-2 puffs into the lungs every 6 (six) hours as needed for wheezing. 01/25/20   Nicolette Bang, MD  benzonatate (TESSALON) 100 MG capsule Take 1 capsule (100 mg total) by mouth every 8 (eight) hours. Patient not taking: No sig reported 10/26/20   Hazel Sams, PA-C  budesonide-formoterol [facility]) 160-4.5 MCG/ACT inhaler Inhale 2 puffs into the lungs 2 (two) times daily. Patient not taking: Reported on 01/14/2021 01/25/20   Nicolette Bang, MD  gabapentin (NEURONTIN) 300 MG capsule Take 1 capsule (300 mg total) by mouth at bedtime. Patient not taking: Reported on 01/14/2021 04/25/20   Nicolette Bang, MD  Insulin Pen Needle (PEN NEEDLES) 32G X 4 MM MISC 1 each by Does not apply route 2 (two) times daily. 01/25/20   Nicolette Bang, MD  naproxen (NAPROSYN) 500 MG tablet Take 1 tablet (500 mg total) by mouth 2 (two) times daily. Patient not taking: No sig reported 12/22/20   Raspet, Erin K, PA-C  ondansetron (ZOFRAN ODT) 8 MG disintegrating tablet Take 1 tablet (8 mg total) by mouth every 8 (eight) hours as needed for nausea or vomiting. Patient not taking: Reported on 01/14/2021 10/01/20   Hazel Sams, PA-C  predniSONE (STERAPRED UNI-PAK 21 TAB) 10 MG (21) TBPK tablet Use as  directed Patient not taking: No sig reported 11/20/20   Evelina Dun A, FNP  promethazine-dextromethorphan (PROMETHAZINE-DM) 6.25-15 MG/5ML syrup Take 5 mLs by mouth 4 (four) times daily as needed for cough. Patient not taking: Reported on 01/14/2021 10/26/20   Hazel Sams, PA-C    Allergies   Allergies  Allergen Reactions   Metformin And Related Diarrhea   Penicillins Other (See Comments)    Unknown; from childhood Has patient had a PCN reaction causing immediate rash, facial/tongue/throat swelling, SOB or lightheadedness with hypotension: Unknow Has patient had a PCN reaction causing severe rash involving mucus membranes or skin necrosis: Unknown Has patient had a PCN reaction that required hospitalization: Unknown Has patient had a PCN reaction occurring within the last 10 years: Unknown If all of the above answers are "NO", then  may proceed with Cephalosporin use.    Victoza [Liraglutide] Itching    Review of Systems  ROS  Neurologic Exam  Awake, alert, oriented Memory and concentration grossly intact Speech fluent, appropriate CN grossly intact Motor exam: Upper Extremities Deltoid Bicep Tricep Grip  Right 5/5 5/5 5/5 5/5  Left 5/5 5/5 5/5 5/5   Lower Extremities IP Quad PF DF EHL  Right 5/5 5/5 5/5 5/5 5/5  Left 5/5 5/5 5/5 5/5 5/5   Sensation grossly intact to LT  Imaging  MRI of the cervical spine dated 12/31/2020 was reviewed.  This reveals broad-based acute disc herniation slightly eccentric to the right with associated significant central stenosis and spinal cord compression.  There is subtle T2 signal change within the cord at this level.  Impression  - 46 y.o. female with right-sided neck and arm pain related to acute disc herniation on the right at C5-6 with associated spinal cord compression and T2 signal change.  Plan  -We will plan on proceeding with ACDF at C5-6  I have reviewed the imaging findings as well as the indications for surgery with the  patient in detail in the office.  We have discussed the associated risks, benefits, and alternatives to surgery as well as the expected postoperative course and recovery.  All the patient's questions today were answered.  She provided informed consent to proceed.  Consuella Lose, MD [facility]

## 2021-01-16 NOTE — Op Note (Signed)
NEUROSURGERY OPERATIVE NOTE   PREOP DIAGNOSIS: Cervical spondylosis with radiculopathy and myelopathy, C5-6  POSTOP DIAGNOSIS: Same  PROCEDURE: 1. Discectomy at C5-6 for decompression of spinal cord and exiting nerve roots  2. Placement of intervertebral biomechanical device Medtronic Titan 55mm lordotic cage 3. Placement of anterior instrumentation consisting of interbody plate and screws - 31VQ Medtronic Zevo plate 67mm screws 4. Use of morselized bone allograft  5. Arthrodesis C5-6, anterior interbody technique  6. Use of intraoperative microscope  SURGEON: Dr. Consuella Lose, MD  ASSISTANT: Dr. Duffy Rhody, MD  ANESTHESIA: General Endotracheal  EBL: Minimal  SPECIMENS: None  DRAINS: None  COMPLICATIONS: None immediate  CONDITION: Hemodynamically stable to PACU  HISTORY: Ruth [name] is a 45 y.o. y.o. female who initially presented to the outpatient clinic with signs and symptoms consistent with cervical radiculopathy and possibly mild myelopathy symptoms. MRI demonstrated large acute disc herniation at C5-6 with spinal cord compression and T2 signal change. Treatment options were discussed including my recommendation for surgical decompression and fusion. She therefore elected to proceed with surgery. After all questions were answered, informed consent was obtained.  PROCEDURE IN DETAIL: The patient was brought to the operating room and transferred to the operative table. After induction of general anesthesia, the patient was positioned on the operative table in the supine position with all pressure points meticulously padded. The skin of the neck was then prepped and draped in the usual sterile fashion.  After timeout was conducted, the skin was infiltrated with local anesthetic. Skin incision was then made sharply and Bovie electrocautery was used to dissect the subcutaneous tissue until the platysma was identified. The platysma was then divided and  undermined. The sternocleidomastoid muscle was then identified and, utilizing natural fascial planes in the neck, the prevertebral fascia was identified and the carotid sheath was retracted laterally and the trachea and esophagus retracted medially. Spinal needle was introduced to identify the C5-6 disc space. Bovie electrocautery was used to dissect in the subperiosteal plane and elevate the bilateral longus coli muscles. Table mounted retractors were then placed. At this point, the microscope was draped and brought into the field, and the remainder of the case was done under the microscope using microdissecting technique.  The disc space was incised sharply and rongeurs were use to initially complete a discectomy. The high-speed drill was then used to complete discectomy until the PLL was identified and elevated with a hook.  Kerrison punch was then used to remove the posterior longitudinal ligament.  I then identified multiple large free disc fragments in the ventral epidural space, slightly eccentric to the right corresponding with the preoperative MRI findings.  These were easily removed with rongeurs and small hook dissectors.  Once this was done, Kerrison punches were used to remove the remainder of the posterior longitudinal ligament until the proximal portion of the exiting C6 nerve roots were identified.  I was unable to easily pass a small dissector within the ventral epidural space indicating good decompression.  No further disc fragments were identified.  Having completed our decompression, attention was turned to placement of the intervertebral device. Trial spacers were used to select a 6 mm lordotic graft. This graft was then filled with morcellized allograft, and inserted until flush with the anterior vertebral bodies.  After placement of the intervertebral device, the above anterior cervical plate was selected, and placed across the interspace. Using a high-speed drill, the cortex of the  cervical vertebral bodies was punctured, and screws inserted in the level  above and below. Final fluoroscopic images in lateral projection was taken to confirm good hardware placement.  At this point, after all counts were verified to be correct, meticulous hemostasis was secured using a combination of bipolar electrocautery and passive hemostatics. The platysma muscle was then closed using interrupted 3-0 Vicryl sutures, and the skin was closed with an interrupted 3-0 Vicry subcuticular stitch. Dermabond and sterile dressings were then applied and the drapes removed.  The patient tolerated the procedure well and was extubated in the room and taken to the postanesthesia care unit in stable condition.  Consuella Lose, MD [facility]

## 2021-01-16 NOTE — Anesthesia Preprocedure Evaluation (Addendum)
Anesthesia Evaluation  Patient identified by MRN, date of birth, ID band Patient awake    Reviewed: Allergy & Precautions, NPO status , Patient's Chart, lab work & pertinent test results  Airway Mallampati: IV  TM Distance: >3 FB   Mouth opening: Limited Mouth Opening  Dental  (+) Teeth Intact, Dental Advisory Given   Pulmonary asthma , sleep apnea , Current Smoker,    breath sounds clear to auscultation       Cardiovascular hypertension, Pt. on medications  Rhythm:Regular Rate:Normal     Neuro/Psych  Headaches, PSYCHIATRIC DISORDERS Anxiety Bipolar Disorder    GI/Hepatic Neg liver ROS, GERD  ,  Endo/Other  diabetes, Type 2, Oral Hypoglycemic Agents, Insulin DependentHypothyroidism   Renal/GU negative Renal ROS     Musculoskeletal  (+) Arthritis , Fibromyalgia -  Abdominal (+) + obese,   Peds  Hematology negative hematology ROS (+)   Anesthesia Other Findings R UE weakness, numbness & tingling  Reproductive/Obstetrics                            Anesthesia Physical Anesthesia Plan  ASA: 3  Anesthesia Plan: General   Post-op Pain Management:    Induction: Intravenous  PONV Risk Score and Plan: 3 and Ondansetron, Dexamethasone and Midazolam  Airway Management Planned: Video Laryngoscope Planned and Oral ETT  Additional Equipment: None  Intra-op Plan:   Post-operative Plan: Extubation in OR  Informed Consent:   Plan Discussed with: CRNA  Anesthesia Plan Comments:        Anesthesia Quick Evaluation

## 2021-01-17 ENCOUNTER — Encounter (HOSPITAL_COMMUNITY): Payer: Self-pay | Admitting: Neurosurgery

## 2021-01-17 DIAGNOSIS — M4712 Other spondylosis with myelopathy, cervical region: Secondary | ICD-10-CM | POA: Diagnosis not present

## 2021-01-17 LAB — GLUCOSE, CAPILLARY
Glucose-Capillary: 268 mg/dL — ABNORMAL HIGH (ref 70–99)
Glucose-Capillary: 256 mg/dL — ABNORMAL HIGH (ref 70–99)

## 2021-01-17 MED ORDER — CETIRIZINE HCL 10 MG PO TABS: 10.0000 mg | ORAL_TABLET | Freq: Every day | ORAL | Status: DC

## 2021-01-17 MED ORDER — FLUTICASONE PROPIONATE 50 MCG/ACT NA SUSP: 1.0000 | Freq: Every day | NASAL | Status: DC

## 2021-01-17 MED ORDER — DIPHENHYDRAMINE HCL 50 MG PO TABS: 50.0000 mg | ORAL_TABLET | Freq: Every day | ORAL | Status: DC | PRN

## 2021-01-17 MED ORDER — OXYCODONE HCL 10 MG PO TABS: 10.0000 mg | ORAL_TABLET | Freq: Four times a day (QID) | ORAL | 0 refills | Status: AC | PRN

## 2021-01-17 MED ORDER — PANTOPRAZOLE SODIUM 40 MG PO TBEC: 40.0000 mg | DELAYED_RELEASE_TABLET | Freq: Every day | ORAL | Status: DC

## 2021-01-17 NOTE — Discharge Summary (Signed)
Physician Discharge Summary  Patient ID: Ruth [name] MRN: [id number] DOB/AGE: [birth year] 45 y.o.  Admit date: 01/16/2021 Discharge date: 01/17/2021  Admission Diagnoses: Cervical stenosis with myelopathy  Discharge Diagnoses: Same Active Problems:   Stenosis of cervical spine with myelopathy [facility])   Discharged Condition: Stable  Hospital Course:  Mrs. Ruth [name] is a 45 y.o. female electively admitted after ACDF. Postoperatively, the patient was at neurologic baseline, reporting relief of right arm pain. She was tolerating diet, ambulating independently, voiding normally, with pain controlled with oral medication.  Treatments: Surgery - ACDF C5-6  Discharge Exam: Blood pressure (!) 146/93, pulse 86, temperature 98 F (36.7 C), temperature source Oral, resp. rate 20, height 5\' 6"  (1.676 m), weight (!) 137.4 kg, last menstrual period 12/27/2020, SpO2 98 %. Awake, alert, oriented Speech fluent, appropriate CN grossly intact 5/5 BUE/BLE Wound c/d/i   Discharge Instructions     Call MD for:  redness, tenderness, or signs of infection (pain, swelling, redness, odor or green/yellow discharge around incision site)   Complete by: As directed    Call MD for:  temperature >100.4   Complete by: As directed    Diet - low sodium heart healthy   Complete by: As directed    Discharge instructions   Complete by: As directed    Walk at home as much as possible, at least 4 times / day   Incentive spirometry RT   Complete by: As directed    Increase activity slowly   Complete by: As directed    Lifting restrictions   Complete by: As directed    No lifting > 10 lbs   May shower / Bathe   Complete by: As directed    48 hours after surgery   May walk up steps   Complete by: As directed    No dressing needed   Complete by: As directed    Other Restrictions   Complete by: As directed    No bending/twisting at waist      Allergies as of 01/17/2021        Reactions   Metformin And Related Diarrhea   Penicillins Other (See Comments)   Unknown; from childhood Has patient had a PCN reaction causing immediate rash, facial/tongue/throat swelling, SOB or lightheadedness with hypotension: Unknow Has patient had a PCN reaction causing severe rash involving mucus membranes or skin necrosis: Unknown Has patient had a PCN reaction that required hospitalization: Unknown Has patient had a PCN reaction occurring within the last 10 years: Unknown If all of the above answers are "NO", then may proceed with Cephalosporin use.   Victoza [liraglutide] Itching        Medication List     STOP taking these medications    benzonatate 100 MG capsule Commonly known as: TESSALON   budesonide-formoterol 160-4.5 MCG/ACT inhaler Commonly known as: SYMBICORT   gabapentin 300 MG capsule Commonly known as: Neurontin   HYDROcodone-acetaminophen 5-325 MG tablet Commonly known as: NORCO/VICODIN   ondansetron 8 MG disintegrating tablet Commonly known as: Zofran ODT   predniSONE 10 MG (21) Tbpk tablet Commonly known as: STERAPRED UNI-PAK 21 TAB   promethazine-dextromethorphan 6.25-15 MG/5ML syrup Commonly known as: PROMETHAZINE-DM       TAKE these medications    Accu-Chek FastClix Lancets Misc E1 1.65, FOUR BLOOD GLUCOSE TESTING FASTING ANND TWO HOURS AFTER SUPPER   Accu-Chek Guide test strip Generic drug: glucose blood CHECK BLOOD SUGARS TWICE, E11.65   albuterol 108 (90 Base) MCG/ACT inhaler Commonly known as:  VENTOLIN HFA Inhale 1-2 puffs into the lungs every 6 (six) hours as needed for wheezing.   albuterol 0.63 MG/3ML nebulizer solution Commonly known as: ACCUNEB Take 3 mLs (0.63 mg total) by nebulization every 4 (four) hours as needed for wheezing or shortness of breath.   amLODipine 5 MG tablet Commonly known as: NORVASC Take 1 tablet (5 mg total) by mouth daily.   atorvastatin 80 MG tablet Commonly known as: LIPITOR Take 1  tablet (80 mg total) by mouth daily at 6 PM.   cetirizine 10 MG tablet Commonly known as: ZYRTEC Take 1 tablet (10 mg total) by mouth daily.   cyclobenzaprine 10 MG tablet Commonly known as: FLEXERIL Take 1 tablet (10 mg total) by mouth 2 (two) times daily as needed for muscle spasms.   diphenhydrAMINE 50 MG tablet Commonly known as: BENADRYL Take 1 tablet (50 mg total) by mouth daily as needed for itching.   fluticasone 50 MCG/ACT nasal spray Commonly known as: FLONASE Place 1-2 sprays into both nostrils daily.   glipiZIDE 10 MG 24 hr tablet Commonly known as: GLUCOTROL XL Take 1 tablet (10 mg total) by mouth daily with breakfast.   insulin detemir 100 UNIT/ML FlexPen Commonly known as: LEVEMIR Inject 38 Units into the skin 2 (two) times daily.   naproxen 500 MG tablet Commonly known as: NAPROSYN Take 1 tablet (500 mg total) by mouth 2 (two) times daily.   Oxycodone HCl 10 MG Tabs Take 1 tablet (10 mg total) by mouth every 6 (six) hours as needed for up to 7 days for severe pain ((score 7 to 10)).   Pen Needles 32G X 4 MM Misc 1 each by Does not apply route 2 (two) times daily.   sitaGLIPtin 100 MG tablet Commonly known as: JANUVIA Take 1 tablet (100 mg total) by mouth daily.   triamterene-hydrochlorothiazide 37.5-25 MG tablet Commonly known as: MAXZIDE-25 Take 1 tablet by mouth daily.               Discharge Care Instructions  (From admission, onward)           Start     Ordered   01/17/21 0000  No dressing needed        01/17/21 1118            Follow-up Information     Consuella Lose, MD Follow up in 3 week(s).   Specialty: Neurosurgery Why: Clinical Follow-up Contact information: [address] [place] [address] [phone]                 Signed: Jairo Ben 01/17/2021, 11:18 AM

## 2021-01-17 NOTE — Plan of Care (Addendum)
Patient discharged from facility this AM to home. Patient denies questions or concerns at this time.  Patient received a dose of oxy for pain (see MAR) and left for home prior to reassessment (left at 1145 hrs).  Problem: Safety: Goal: Ability to remain free from injury will improve 01/17/2021 1144 by Jesse Sans, RN Outcome: Adequate for Discharge 01/17/2021 1001 by Jesse Sans, RN Outcome: Progressing   Problem: Education: Goal: Ability to verbalize activity precautions or restrictions will improve 01/17/2021 1144 by Jesse Sans, RN Outcome: Adequate for Discharge 01/17/2021 1001 by Jesse Sans, RN Outcome: Progressing Goal: Knowledge of the prescribed therapeutic regimen will improve 01/17/2021 1144 by Jesse Sans, RN Outcome: Adequate for Discharge 01/17/2021 1001 by Jesse Sans, RN Outcome: Progressing Goal: Understanding of discharge needs will improve 01/17/2021 1144 by Jesse Sans, RN Outcome: Adequate for Discharge 01/17/2021 1001 by Jesse Sans, RN Outcome: Progressing   Problem: Activity: Goal: Ability to avoid complications of mobility impairment will improve 01/17/2021 1144 by Jesse Sans, RN Outcome: Adequate for Discharge 01/17/2021 1001 by Jesse Sans, RN Outcome: Progressing Goal: Ability to tolerate increased activity will improve 01/17/2021 1144 by Jesse Sans, RN Outcome: Adequate for Discharge 01/17/2021 1001 by Jesse Sans, RN Outcome: Progressing Goal: Will remain free from falls 01/17/2021 1144 by Jesse Sans, RN Outcome: Adequate for Discharge 01/17/2021 1001 by Jesse Sans, RN Outcome: Progressing   Problem: Bowel/Gastric: Goal: Gastrointestinal status for postoperative course will improve 01/17/2021 1144 by Jesse Sans, RN Outcome: Adequate for Discharge 01/17/2021 1001 by Jesse Sans, RN Outcome: Progressing   Problem: Clinical Measurements: Goal: Ability to maintain clinical measurements  within normal limits will improve 01/17/2021 1144 by Jesse Sans, RN Outcome: Adequate for Discharge 01/17/2021 1001 by Jesse Sans, RN Outcome: Progressing Goal: Postoperative complications will be avoided or minimized 01/17/2021 1144 by Jesse Sans, RN Outcome: Adequate for Discharge 01/17/2021 1001 by Jesse Sans, RN Outcome: Progressing Goal: Diagnostic test results will improve 01/17/2021 1144 by Jesse Sans, RN Outcome: Adequate for Discharge 01/17/2021 1001 by Jesse Sans, RN Outcome: Progressing   Problem: Pain Management: Goal: Pain level will decrease 01/17/2021 1144 by Jesse Sans, RN Outcome: Adequate for Discharge 01/17/2021 1001 by Jesse Sans, RN Outcome: Progressing   Problem: Skin Integrity: Goal: Will show signs of wound healing 01/17/2021 1144 by Jesse Sans, RN Outcome: Adequate for Discharge 01/17/2021 1001 by Jesse Sans, RN Outcome: Progressing   Problem: Health Behavior/Discharge Planning: Goal: Identification of resources available to assist in meeting health care needs will improve 01/17/2021 1144 by Jesse Sans, RN Outcome: Adequate for Discharge 01/17/2021 1001 by Jesse Sans, RN Outcome: Progressing   Problem: Bladder/Genitourinary: Goal: Urinary functional status for postoperative course will improve 01/17/2021 1144 by Jesse Sans, RN Outcome: Adequate for Discharge 01/17/2021 1001 by Jesse Sans, RN Outcome: Progressing

## 2021-01-17 NOTE — Progress Notes (Signed)
  NEUROSURGERY PROGRESS NOTE   No issues overnight. Pt reports improvement in RUE pain. Still has some numbness. Tolerating diet.  EXAM:  BP (!) 146/93 (BP Location: Right Wrist)   Pulse 86   Temp 98 F (36.7 C) (Oral)   Resp 20   Ht 5\' 6"  (1.676 m)   Wt (!) 137.4 kg   LMP 12/27/2020   SpO2 98%   BMI 48.91 kg/m   Awake, alert, oriented  Speech fluent, appropriate  CN grossly intact  5/5 BUE/BLE   IMPRESSION:  45 y.o. female POD#1 C5-6 ACDF, doing well  PLAN: - d/c home today   Consuella Lose, MD [facility]

## 2021-01-17 NOTE — Evaluation (Signed)
Physical Therapy Evaluation and Discharge Patient Details Name: Ruth [name] MRN: [id number] DOB: 11/26/1975 Today's Date: 01/17/2021   History of Present Illness  Pt is a 45 y/o female who presents s/p C5-C6 decompression on 01/16/2021. PMH significant for Fibromyalgia, HTN, Anxiety, DM2, Anemia.  Clinical Impression  Patient evaluated by Physical Therapy with no further acute PT needs identified. All education has been completed and the patient has no further questions. Pt was able to demonstrate transfers and ambulation with gross modified independence and no AD. Pt was educated on precautions, positioning recommendations, appropriate activity progression, and car transfer. See below for any follow-up Physical Therapy or equipment needs. PT is signing off. Thank you for this referral.     Follow Up Recommendations No PT follow up;Supervision - Intermittent    Equipment Recommendations  None recommended by PT    Recommendations for Other Services       Precautions / Restrictions Precautions Precautions: Cervical;Fall Precaution Booklet Issued: Yes (comment) Precaution Comments: Reviewed handout and pt was cued for precautions during functional mobility. Restrictions Weight Bearing Restrictions: No      Mobility  Bed Mobility Overal bed mobility: Modified Independent             General bed mobility comments: HOB elevated, no difficulty coming to sit EOB. Reviewed log roll technique.    Transfers Overall transfer level: Modified independent Equipment used: None             General transfer comment: Pt demonstrated proper hand placement on seated surface for safety. No assist required.  Ambulation/Gait Ambulation/Gait assistance: Modified independent (Device/Increase time) Gait Distance (Feet): 250 Feet Assistive device: None Gait Pattern/deviations: Step-through pattern;Decreased stride length Gait velocity: Decreased Gait velocity interpretation:  <1.31 ft/sec, indicative of household ambulator General Gait Details: Slow but generally steady without overt LOB noted.  Stairs Stairs: Yes Stairs assistance: Modified independent (Device/Increase time) Stair Management: One rail Right;Alternating pattern;Forwards Number of Stairs: 10 General stair comments: VC's for general safety and to count stairs at home to avoid missing a step. Encouraged neutral cervical spine and to avoid looking down.  Wheelchair Mobility    Modified Rankin (Stroke Patients Only)       Balance Overall balance assessment: No apparent balance deficits (not formally assessed)                                           Pertinent Vitals/Pain Pain Assessment: Faces Faces Pain Scale: Hurts little more Pain Location: Shoulders, incision site Pain Descriptors / Indicators: Operative site guarding;Aching;Sore Pain Intervention(s): Limited activity within patient's tolerance;Monitored during session;Repositioned    Home Living Family/patient expects to be discharged to:: Private residence Living Arrangements: Children;Spouse/significant other Available Help at Discharge: Family;Available 24 hours/day Type of Home: Apartment Home Access: Level entry     Home Layout: Two level Home Equipment: None      Prior Function Level of Independence: Independent               Hand Dominance   Dominant Hand: Right    Extremity/Trunk Assessment   Upper Extremity Assessment Upper Extremity Assessment: Defer to OT evaluation    Lower Extremity Assessment Lower Extremity Assessment: Overall WFL for tasks assessed    Cervical / Trunk Assessment Cervical / Trunk Assessment: Other exceptions Cervical / Trunk Exceptions: s/p surgery  Communication   Communication: No difficulties  Cognition Arousal/Alertness: Awake/alert Behavior  During Therapy: WFL for tasks assessed/performed Overall Cognitive Status: Within Functional Limits for  tasks assessed                                        General Comments General comments (skin integrity, edema, etc.): VSS on RA    Exercises     Assessment/Plan    PT Assessment Patent does not need any further PT services  PT Problem List         PT Treatment Interventions      PT Goals (Current goals can be found in the Care Plan section)  Acute Rehab PT Goals Patient Stated Goal: To go home PT Goal Formulation: All assessment and education complete, DC therapy    Frequency     Barriers to discharge        Co-evaluation               AM-PAC PT "6 Clicks" Mobility  Outcome Measure Help needed turning from your back to your side while in a flat bed without using bedrails?: None Help needed moving from lying on your back to sitting on the side of a flat bed without using bedrails?: None Help needed moving to and from a bed to a chair (including a wheelchair)?: None Help needed standing up from a chair using your arms (e.g., wheelchair or bedside chair)?: None Help needed to walk in hospital room?: None Help needed climbing 3-5 steps with a railing? : None 6 Click Score: 24    End of Session Equipment Utilized During Treatment: Gait belt Activity Tolerance: Patient tolerated treatment well Patient left: in bed;with call bell/phone within reach;with nursing/sitter in room Nurse Communication: Mobility status PT Visit Diagnosis: Unsteadiness on feet (R26.81);Pain Pain - part of body:  (back)    Time: 2423-5361 PT Time Calculation (min) (ACUTE ONLY): 18 min   Charges:   PT Evaluation $PT Eval Low Complexity: 1 Low          Rolinda Roan, PT, DPT Acute Rehabilitation Services Pager: [phone] Office: [phone]   Thelma Comp 01/17/2021, 10:40 AM

## 2021-01-17 NOTE — Evaluation (Signed)
Occupational Therapy Evaluation Patient Details Name: Ruth [name] MRN: [id number] DOB: [birth year] Today's Date: 01/17/2021    History of Present Illness 45 y.o. F admitted to [facility] on 7/14 for decompression of C5-C6. Prior medical history significant for Fibromyalgia, HTN, Anxiety, DM2, Anemia, and Arthritis.   Clinical Impression   Pt admitted for procedure listed above. 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 Pt presented today with continued independence with all ADL's, using no DME, and ambulating safely with no assist. Pt does not need OT services at this time and acute OT will sign off.     Follow Up Recommendations  No OT follow up    Equipment Recommendations  None recommended by OT    Recommendations for Other Services       Precautions / Restrictions Precautions Precautions: Cervical Precaution Booklet Issued: No Precaution Comments: Verbally reviewed all Cervical precaitions. Restrictions Weight Bearing Restrictions: No      Mobility Bed Mobility Overal bed mobility: Modified Independent             General bed mobility comments: HOB elevated, no difficulty coming to sit EOB    Transfers Overall transfer level: Modified independent Equipment used: None             General transfer comment: Pt able to sit<>stand from bed in lowest setting, standard toilet, and recliner with no difficultyies    Balance Overall balance assessment: No apparent balance deficits (not formally assessed)                                         ADL either performed or assessed with clinical judgement   ADL Overall ADL's : Modified independent;At baseline                                       General ADL Comments: Pt able to get dressed, groom standing at the sink, toilet, and simulate bathing with no assist.     Vision Baseline Vision/History: No  visual deficits Patient Visual Report: No change from baseline Vision Assessment?: No apparent visual deficits     Perception Perception Perception Tested?: No   Praxis Praxis Praxis tested?: Not tested    Pertinent Vitals/Pain Pain Assessment: No/denies pain     Hand Dominance Right   Extremity/Trunk Assessment Upper Extremity Assessment Upper Extremity Assessment: Overall WFL for tasks assessed   Lower Extremity Assessment Lower Extremity Assessment: Defer to PT evaluation   Cervical / Trunk Assessment Cervical / Trunk Assessment: Normal;Other exceptions Cervical / Trunk Exceptions: s/p cervical decompress   Communication Communication Communication: No difficulties   Cognition Arousal/Alertness: Awake/alert Behavior During Therapy: WFL for tasks assessed/performed Overall Cognitive Status: Within Functional Limits for tasks assessed                                     General Comments  VSS on RA    Exercises     Shoulder Instructions      Home Living Family/patient expects to be discharged to:: Private residence Living Arrangements: Children;Spouse/significant other Available Help at Discharge: Family;Available 24 hours/day Type of Home: Apartment Home Access: Level entry     Home Layout:  Two level Alternate Level Stairs-Number of Steps: 1 flight   Bathroom Shower/Tub: Teacher, early years/pre: Standard     Home Equipment: None          Prior Functioning/Environment Level of Independence: Independent                 OT Problem List: Decreased strength;Decreased activity tolerance;Obesity;Pain      OT Treatment/Interventions:      OT Goals(Current goals can be found in the care plan section) Acute Rehab OT Goals Patient Stated Goal: To go home OT Goal Formulation: All assessment and education complete, DC therapy Time For Goal Achievement: 01/17/21 Potential to Achieve Goals: Good  OT Frequency:      Barriers to D/C:            Co-evaluation              AM-PAC OT "6 Clicks" Daily Activity     Outcome Measure Help from another person eating meals?: None Help from another person taking care of personal grooming?: None Help from another person toileting, which includes using toliet, bedpan, or urinal?: None Help from another person bathing (including washing, rinsing, drying)?: None Help from another person to put on and taking off regular upper body clothing?: None Help from another person to put on and taking off regular lower body clothing?: None 6 Click Score: 24   End of Session Nurse Communication: Mobility status  Activity Tolerance: Patient tolerated treatment well Patient left: in chair;with call bell/phone within reach  OT Visit Diagnosis: Unsteadiness on feet (R26.81);Muscle weakness (generalized) (M62.81)                Time: 2706-2376 OT Time Calculation (min): 18 min Charges:  OT General Charges $OT Visit: 1 Visit OT Evaluation $OT Eval Low Complexity: [place]., OTR/L Acute Rehabilitation  Nedra Mcinnis Elane Yolanda Bonine 01/17/2021, 9:52 AM

## 2021-01-17 NOTE — Plan of Care (Signed)
Potential discharge to home later today.   Problem: Safety: Goal: Ability to remain free from injury will improve Outcome: Progressing   Problem: Education: Goal: Ability to verbalize activity precautions or restrictions will improve Outcome: Progressing Goal: Knowledge of the prescribed therapeutic regimen will improve Outcome: Progressing Goal: Understanding of discharge needs will improve Outcome: Progressing   Problem: Activity: Goal: Ability to avoid complications of mobility impairment will improve Outcome: Progressing Goal: Ability to tolerate increased activity will improve Outcome: Progressing Goal: Will remain free from falls Outcome: Progressing   Problem: Bowel/Gastric: Goal: Gastrointestinal status for postoperative course will improve Outcome: Progressing   Problem: Clinical Measurements: Goal: Ability to maintain clinical measurements within normal limits will improve Outcome: Progressing Goal: Postoperative complications will be avoided or minimized Outcome: Progressing Goal: Diagnostic test results will improve Outcome: Progressing   Problem: Pain Management: Goal: Pain level will decrease Outcome: Progressing   Problem: Skin Integrity: Goal: Will show signs of wound healing Outcome: Progressing   Problem: Health Behavior/Discharge Planning: Goal: Identification of resources available to assist in meeting health care needs will improve Outcome: Progressing   Problem: Bladder/Genitourinary: Goal: Urinary functional status for postoperative course will improve Outcome: Progressing

## 2021-01-17 NOTE — Anesthesia Postprocedure Evaluation (Signed)
Anesthesia Post Note  Patient: [name]  Procedure(s) Performed: Cervical five-six Anterior cervical decompression/discectomy/fusion (Spine Cervical)     Patient location during evaluation: PACU Anesthesia Type: General Level of consciousness: awake and alert Pain management: pain level controlled Vital Signs Assessment: post-procedure vital signs reviewed and stable Respiratory status: spontaneous breathing, nonlabored ventilation, respiratory function stable and patient connected to nasal cannula oxygen Cardiovascular status: blood pressure returned to baseline and stable Postop Assessment: no apparent nausea or vomiting Anesthetic complications: no   No notable events documented.  LLE Motor Response: Purposeful movement (01/17/21 0802) LLE Sensation: Full sensation (01/17/21 0802) RLE Motor Response: Purposeful movement (01/17/21 0802) RLE Sensation: Full sensation (01/17/21 0802)      Ruth [name]

## 2021-02-06 DIAGNOSIS — I10 Essential (primary) hypertension: Secondary | ICD-10-CM | POA: Diagnosis not present

## 2021-02-06 DIAGNOSIS — Z6841 Body Mass Index (BMI) 40.0 and over, adult: Secondary | ICD-10-CM | POA: Diagnosis not present

## 2021-02-06 DIAGNOSIS — M5000 Cervical disc disorder with myelopathy, unspecified cervical region: Secondary | ICD-10-CM | POA: Diagnosis not present

## 2021-02-11 ENCOUNTER — Ambulatory Visit (HOSPITAL_COMMUNITY): Admit: 2021-02-11 | Payer: Medicaid Other

## 2021-02-12 ENCOUNTER — Ambulatory Visit (HOSPITAL_COMMUNITY)
Admission: EM | Admit: 2021-02-12 | Discharge: 2021-02-12 | Disposition: A | Discharge status: Home / Self Care | Payer: Medicaid Other | Attending: Student | Admitting: Student

## 2021-02-12 ENCOUNTER — Other Ambulatory Visit: Payer: Self-pay

## 2021-02-12 ENCOUNTER — Encounter (HOSPITAL_COMMUNITY): Payer: Self-pay

## 2021-02-12 VITALS — BP 129/82 | HR 96 | Temp 98.5°F | Resp 19

## 2021-02-12 DIAGNOSIS — B3731 Acute candidiasis of vulva and vagina: Secondary | ICD-10-CM

## 2021-02-12 DIAGNOSIS — B373 Candidiasis of vulva and vagina: Secondary | ICD-10-CM

## 2021-02-12 MED ORDER — FLUCONAZOLE 150 MG PO TABS: 150.0000 mg | ORAL_TABLET | Freq: Every day | ORAL | 0 refills | Status: DC

## 2021-02-12 NOTE — ED Triage Notes (Signed)
Pt in with c/o vaginal itching x 2 weeks  Pt took otc yeast medication with temporary relief but sx resumed  States she thinks she has yeast infection

## 2021-02-12 NOTE — Discharge Instructions (Addendum)
-  For your yeast infection, start the Diflucan (fluconazole)- Take one pill today (day 1). If you're still having symptoms in 3 days, take the second pill.  -If your husband develops symptoms like white penile discharge or redness/irritation on the penis, he should also seek medical attention -Abstain from intercourse until you've completed treatment and symptoms have resolved

## 2021-02-12 NOTE — ED Provider Notes (Signed)
[place]    CSN: [id number] Arrival date & time: 02/12/21  1230      History   Chief Complaint Chief Complaint  Patient presents with   Vaginal Itching    HPI Ruth [name] is a 45 y.o. female presenting with concern for vaginal candida. Medical history arthritis, asthma, bipolar, fibromyalgia, hyperlipidemia, obesity, type 2 diabetes. Notes two weeks of external vaginal irritation, improved with over-the-counter Monistat but then symptoms returned.  Same female partner, and he does not have any symptoms.  Patient states she has not seen any vaginal discharge.  Denies abdominal pain, back pain, urinary symptoms like dysuria, hematuria, frequency.  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   BMI 45.0-49.9, adult ([place]) 09/04/2020   Right arm pain 02/14/2020   Hyperlipidemia associated with type 2 diabetes mellitus ([place]) 09/19/2019   Abnormal uterine bleeding (AUB) 12/07/2017   Abnormal mammogram of both breasts 11/02/2016   Hypothyroidism 06/21/2009   Type 2 diabetes mellitus with peripheral neuropathy ([place]) 06/21/2009   BIPOLAR AFFECTIVE DISORDER 06/21/2009   PERSISTENT DISORDER INITIATING/MAINTAINING SLEEP 06/21/2009   INADEQUATE SLEEP HYGIENE 06/21/2009   OBSTRUCTIVE SLEEP APNEA 06/21/2009   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   CARPAL TUNNEL SYNDROME 06/20/2009   MERALGIA PARESTHETICA 06/20/2009   Essential  hypertension 06/20/2009   FIBROMYALGIA 06/20/2009   CEPHALGIA 06/20/2009    Past Surgical History:  Procedure Laterality Date   ANTERIOR CERVICAL DECOMP/DISCECTOMY FUSION N/A 01/16/2021   Procedure: Cervical five-six Anterior cervical decompression/discectomy/fusion;  Surgeon: Consuella Lose, MD;  Location: [facility];  Service: Neurosurgery;  Laterality: N/A;   CARPAL TUNNEL RELEASE Right    CESAREAN SECTION     x2   DILITATION & CURRETTAGE/HYSTROSCOPY WITH NOVASURE ABLATION N/A 12/07/2017   Procedure: DILATATION & CURETTAGE/HYSTEROSCOPY WITH ATTEMPED NOVASURE ABLATION, FAILED/ENDOMETERIAL ABLATION WITH HTA SYSTEM;  Surgeon: Lavonia Drafts, MD;  Location: [facility];  Service: Gynecology;  Laterality: N/A;   ELBOW SURGERY     "surgery for nerve pain"   GANGLION CYST EXCISION Right    right arm    RECTOVAGINAL FISTULA CLOSURE     after vag delivery   TONSILLECTOMY     childhood   TUBAL LIGATION      OB History     Gravida  7   Para  5   Term  4   Preterm  1   AB  2   Living  5      SAB  2   IAB      Ectopic      Multiple      Live Births  5        Obstetric Comments  Vag x 2>c/s emergency>vbac>c/s and BTL H/o RV fistula and then  repaired after vag delivery          Home Medications    Prior to Admission medications   Medication Sig Start Date End Date Taking? Authorizing Provider  fluconazole (DIFLUCAN) 150 MG tablet Take 1 tablet (150 mg total) by mouth daily. -For your yeast infection, start the Diflucan (fluconazole)- Take one pill today (day 1). If you're still having symptoms in 3 days, take the second pill. 02/12/21  Yes Hazel Sams, PA-C  Accu-Chek FastClix Lancets MISC E1 1.65, FOUR BLOOD GLUCOSE TESTING FASTING ANND TWO HOURS AFTER SUPPER 07/01/18   [provider]  ACCU-CHEK GUIDE test strip CHECK BLOOD SUGARS TWICE, E11.65 12/21/18   [provider]  albuterol (ACCUNEB) 0.63 MG/3ML nebulizer solution Take 3 mLs (0.63  mg total) by nebulization every 4 (four) hours as needed for wheezing or shortness of breath. 11/20/20   Sharion Balloon, FNP  albuterol (VENTOLIN HFA) 108 (90 Base) MCG/ACT inhaler Inhale 1-2 puffs into the lungs every 6 (six) hours as needed for wheezing. 01/25/20   Nicolette Bang, MD  amLODipine (NORVASC) 5 MG tablet Take 1 tablet (5 mg total) by mouth daily. 04/25/20   Nicolette Bang, MD  atorvastatin (LIPITOR) 80 MG tablet Take 1 tablet (80 mg total) by mouth daily at 6 PM. 04/25/20   Nicolette Bang, MD  cetirizine (ZYRTEC) 10 MG tablet Take 1 tablet (10 mg total) by mouth daily. 01/17/21   Consuella Lose, MD  cyclobenzaprine (FLEXERIL) 10 MG tablet Take 1 tablet (10 mg total) by mouth 2 (two) times daily as needed for muscle spasms. 12/22/20   Raspet, Derry Skill, PA-C  diphenhydrAMINE (BENADRYL) 50 MG tablet Take 1 tablet (50 mg total) by mouth daily as needed for itching. 01/17/21   Consuella Lose, MD  fluticasone (FLONASE) 50 MCG/ACT nasal spray Place 1-2 sprays into both nostrils daily. 01/17/21   Consuella Lose, MD  glipiZIDE (GLUCOTROL XL) 10 MG 24 hr tablet Take 1 tablet (10 mg total) by mouth daily with breakfast. 04/25/20   Nicolette Bang, MD  insulin detemir (LEVEMIR) 100 UNIT/ML FlexPen Inject 38 Units into the skin 2 (two) times daily. 04/25/20   Nicolette Bang, MD  Insulin Pen Needle (PEN NEEDLES) 32G X 4 MM MISC 1 each by Does not apply route 2 (two) times daily. 01/25/20   Nicolette Bang, MD  naproxen (NAPROSYN) 500 MG tablet Take 1 tablet (500 mg total) by mouth 2 (two) times daily. 12/22/20   Raspet, Derry Skill, PA-C  oxyCODONE (OXY IR/ROXICODONE) 5 MG immediate release tablet Take 5 mg by mouth every 8 (eight) hours as needed. 02/06/21   [provider]  sitaGLIPtin (JANUVIA) 100 MG tablet Take 1 tablet (100 mg total) by mouth daily. 04/25/20   Nicolette Bang, MD  triamterene-hydrochlorothiazide  (MAXZIDE-25) 37.5-25 MG tablet Take 1 tablet by mouth daily. 04/25/20   Nicolette Bang, MD    Family History Family History  Problem Relation Age of Onset   Thyroid disease Mother    Diabetes Daughter    Diabetes Son    Breast cancer Neg Hx     Social History Social History   Tobacco Use   Smoking status: Every Day    Packs/day: 0.25    Types: Cigarettes   Smokeless tobacco: Never  Vaping Use   Vaping Use: Never used  Substance Use Topics   Alcohol use: Yes    Comment: occasionally   Drug use: Yes  Types: Marijuana    Comment: Last use on 01/11/21     Allergies   Metformin and related, Penicillins, and Victoza [liraglutide]   Review of Systems Review of Systems  Constitutional:  Negative for chills and fever.  HENT:  Negative for sore throat.   Eyes:  Negative for pain and redness.  Respiratory:  Negative for shortness of breath.   Cardiovascular:  Negative for chest pain.  Gastrointestinal:  Negative for abdominal pain, diarrhea, nausea and vomiting.  Genitourinary:  Negative for decreased urine volume, difficulty urinating, dysuria, flank pain, frequency, genital sores, hematuria, pelvic pain, urgency, vaginal bleeding, vaginal discharge and vaginal pain.  Musculoskeletal:  Negative for back pain.  Skin:  Negative for rash.  All other systems reviewed and are negative.   Physical Exam Triage Vital Signs ED Triage Vitals  Enc Vitals Group     BP      Pulse      Resp      Temp      Temp src      SpO2      Weight      Height      Head Circumference      Peak Flow      Pain Score      Pain Loc      Pain Edu?      Excl. in [place]?    No data found.  Updated Vital Signs BP 129/82 (BP Location: Left Arm)   Pulse 96   Temp 98.5 F (36.9 C) (Oral)   Resp 19   LMP 01/20/2021 (Approximate)   SpO2 98%   Visual Acuity Right Eye Distance:   Left Eye Distance:   Bilateral Distance:    Right Eye Near:   Left Eye Near:    Bilateral Near:      Physical Exam Vitals reviewed.  Constitutional:      General: She is not in acute distress.    Appearance: Normal appearance. She is obese. She is not ill-appearing.  HENT:     Head: Normocephalic and atraumatic.     Mouth/Throat:     Mouth: Mucous membranes are moist.     Comments: Moist mucous membranes Eyes:     Extraocular Movements: Extraocular movements intact.     Pupils: Pupils are equal, round, and reactive to light.  Cardiovascular:     Rate and Rhythm: Normal rate and regular rhythm.     Heart sounds: Normal heart sounds.  Pulmonary:     Effort: Pulmonary effort is normal.     Breath sounds: Normal breath sounds. No wheezing, rhonchi or rales.  Abdominal:     General: Bowel sounds are normal. There is no distension.     Palpations: Abdomen is soft. There is no mass.     Tenderness: There is no abdominal tenderness. There is no right CVA tenderness, left CVA tenderness, guarding or rebound.  Genitourinary:    Comments: deferred Skin:    General: Skin is warm.     Capillary Refill: Capillary refill takes less than 2 seconds.     Comments: Good skin turgor  Neurological:     General: No focal deficit present.     Mental Status: She is alert and oriented to person, place, and time.  Psychiatric:        Mood and Affect: Mood normal.        Behavior: Behavior normal.     UC Treatments / Results  Labs (all labs ordered are listed, but  only abnormal results are displayed) Labs Reviewed  CERVICOVAGINAL ANCILLARY ONLY    EKG   Radiology No results found.  Procedures Procedures (including critical care time)  Medications Ordered in UC Medications - No data to display  Initial Impression / Assessment and Plan / UC Course  I have reviewed the triage vital signs and the nursing notes.  Pertinent labs & imaging results that were available during my care of the patient were reviewed by me and considered in my medical decision making (see chart for  details).     This patient is a very pleasant 46 y.o. year old female presenting with suspected vaginal candidiasis. Afebrile, nontachy, no abd pain or CVAT on exam. Denies STI risk. Will send for G/C, trich, yeast, BV testing. Diflucan sent. Safe sex precautions. ED return precautions discussed. Patient verbalizes understanding and agreement.     Final Clinical Impressions(s) / UC Diagnoses   Final diagnoses:  Vaginal candidiasis     Discharge Instructions      -For your yeast infection, start the Diflucan (fluconazole)- Take one pill today (day 1). If you're still having symptoms in 3 days, take the second pill.  -If your husband develops symptoms like white penile discharge or redness/irritation on the penis, he should also seek medical attention -Abstain from intercourse until you've completed treatment and symptoms have resolved     ED Prescriptions     Medication Sig Dispense Auth. Provider   fluconazole (DIFLUCAN) 150 MG tablet Take 1 tablet (150 mg total) by mouth daily. -For your yeast infection, start the Diflucan (fluconazole)- Take one pill today (day 1). If you're still having symptoms in 3 days, take the second pill. 2 tablet Hazel Sams, PA-C      PDMP not reviewed this encounter.   Hazel Sams, PA-C 02/12/21 1420

## 2021-02-13 LAB — CERVICOVAGINAL ANCILLARY ONLY
Bacterial Vaginitis (gardnerella): NEGATIVE
Candida Glabrata: NEGATIVE
Candida Vaginitis: NEGATIVE
Chlamydia: NEGATIVE
Comment: NEGATIVE
Comment: NEGATIVE
Comment: NEGATIVE
Comment: NEGATIVE
Comment: NEGATIVE
Comment: NORMAL
Neisseria Gonorrhea: NEGATIVE
Trichomonas: NEGATIVE

## 2021-02-21 DIAGNOSIS — Z03818 Encounter for observation for suspected exposure to other biological agents ruled out: Secondary | ICD-10-CM | POA: Diagnosis not present

## 2021-03-07 DIAGNOSIS — Z20828 Contact with and (suspected) exposure to other viral communicable diseases: Secondary | ICD-10-CM | POA: Diagnosis not present

## 2021-03-21 DIAGNOSIS — Z20828 Contact with and (suspected) exposure to other viral communicable diseases: Secondary | ICD-10-CM | POA: Diagnosis not present

## 2021-03-28 ENCOUNTER — Emergency Department (HOSPITAL_COMMUNITY)
Admission: EM | Admit: 2021-03-28 | Discharge: 2021-03-29 | Disposition: A | Discharge status: Other Acute Inpatient Hospital | Payer: Medicaid Other | Attending: Emergency Medicine | Admitting: Emergency Medicine

## 2021-03-28 ENCOUNTER — Encounter (HOSPITAL_COMMUNITY): Payer: Self-pay

## 2021-03-28 DIAGNOSIS — R739 Hyperglycemia, unspecified: Secondary | ICD-10-CM | POA: Diagnosis not present

## 2021-03-28 DIAGNOSIS — F17210 Nicotine dependence, cigarettes, uncomplicated: Secondary | ICD-10-CM | POA: Insufficient documentation

## 2021-03-28 DIAGNOSIS — Z20822 Contact with and (suspected) exposure to covid-19: Secondary | ICD-10-CM | POA: Insufficient documentation

## 2021-03-28 DIAGNOSIS — Z7984 Long term (current) use of oral hypoglycemic drugs: Secondary | ICD-10-CM | POA: Insufficient documentation

## 2021-03-28 DIAGNOSIS — T50902A Poisoning by unspecified drugs, medicaments and biological substances, intentional self-harm, initial encounter: Secondary | ICD-10-CM | POA: Insufficient documentation

## 2021-03-28 DIAGNOSIS — R45851 Suicidal ideations: Secondary | ICD-10-CM

## 2021-03-28 DIAGNOSIS — J45909 Unspecified asthma, uncomplicated: Secondary | ICD-10-CM | POA: Diagnosis not present

## 2021-03-28 DIAGNOSIS — R4689 Other symptoms and signs involving appearance and behavior: Secondary | ICD-10-CM

## 2021-03-28 DIAGNOSIS — F4320 Adjustment disorder, unspecified: Secondary | ICD-10-CM | POA: Insufficient documentation

## 2021-03-28 DIAGNOSIS — E039 Hypothyroidism, unspecified: Secondary | ICD-10-CM | POA: Diagnosis not present

## 2021-03-28 DIAGNOSIS — E1140 Type 2 diabetes mellitus with diabetic neuropathy, unspecified: Secondary | ICD-10-CM | POA: Diagnosis not present

## 2021-03-28 DIAGNOSIS — R404 Transient alteration of awareness: Secondary | ICD-10-CM | POA: Diagnosis not present

## 2021-03-28 DIAGNOSIS — T887XXA Unspecified adverse effect of drug or medicament, initial encounter: Secondary | ICD-10-CM | POA: Diagnosis not present

## 2021-03-28 DIAGNOSIS — R0902 Hypoxemia: Secondary | ICD-10-CM | POA: Diagnosis not present

## 2021-03-28 DIAGNOSIS — Z79899 Other long term (current) drug therapy: Secondary | ICD-10-CM | POA: Diagnosis not present

## 2021-03-28 DIAGNOSIS — R000 Tachycardia, unspecified: Secondary | ICD-10-CM | POA: Diagnosis not present

## 2021-03-28 DIAGNOSIS — Z794 Long term (current) use of insulin: Secondary | ICD-10-CM | POA: Diagnosis not present

## 2021-03-28 DIAGNOSIS — R456 Violent behavior: Secondary | ICD-10-CM | POA: Diagnosis not present

## 2021-03-28 DIAGNOSIS — I10 Essential (primary) hypertension: Secondary | ICD-10-CM | POA: Insufficient documentation

## 2021-03-28 HISTORY — DX: Poisoning by unspecified drugs, medicaments and biological substances, intentional self-harm, initial encounter: T50.902A

## 2021-03-28 LAB — ETHANOL: Alcohol, Ethyl (B): <10 mg/dL (ref <10)

## 2021-03-28 LAB — ACETAMINOPHEN LEVEL: Acetaminophen (Tylenol), Serum: <10 ug/mL — ABNORMAL LOW (ref 10–30)

## 2021-03-28 LAB — COMPREHENSIVE METABOLIC PANEL
ALT: 13 U/L (ref 0–44)
AST: 16 U/L (ref 15–41)
Albumin: 4.2 g/dL (ref 3.5–5.0)
Alkaline Phosphatase: 90 U/L (ref 38–126)
Anion gap: 11 (ref 5–15)
BUN: 10 mg/dL (ref 6–20)
CO2: 24 mmol/L (ref 22–32)
Calcium: 10.2 mg/dL (ref 8.9–10.3)
Chloride: 108 mmol/L (ref 98–111)
Creatinine, Ser: 0.95 mg/dL (ref 0.44–1.00)
GFR, Estimated: >60 mL/min (ref >60)
Glucose, Bld: 273 mg/dL — ABNORMAL HIGH (ref 70–99)
Potassium: 3.7 mmol/L (ref 3.5–5.1)
Sodium: 143 mmol/L (ref 135–145)
Total Bilirubin: 0.4 mg/dL (ref 0.3–1.2)
Total Protein: 7.7 g/dL (ref 6.5–8.1)

## 2021-03-28 LAB — CBC
HCT: 34.6 % — ABNORMAL LOW (ref 36.0–46.0)
Hemoglobin: 10.7 g/dL — ABNORMAL LOW (ref 12.0–15.0)
MCH: 24.9 pg — ABNORMAL LOW (ref 26.0–34.0)
MCHC: 30.9 g/dL (ref 30.0–36.0)
MCV: 80.7 fL (ref 80.0–100.0)
Platelets: 459 10*3/uL — ABNORMAL HIGH (ref 150–400)
RBC: 4.29 MIL/uL (ref 3.87–5.11)
RDW: 15.9 % — ABNORMAL HIGH (ref 11.5–15.5)
WBC: 8.8 10*3/uL (ref 4.0–10.5)
nRBC: 0 % (ref 0.0–0.2)

## 2021-03-28 LAB — MAGNESIUM: Magnesium: 1.9 mg/dL (ref 1.7–2.4)

## 2021-03-28 LAB — I-STAT BETA HCG BLOOD, ED (MC, WL, AP ONLY): I-stat hCG, quantitative: <5 m[IU]/mL (ref <5)

## 2021-03-28 LAB — SALICYLATE LEVEL: Salicylate Lvl: <7 mg/dL — ABNORMAL LOW (ref 7.0–30.0)

## 2021-03-28 LAB — CBG MONITORING, ED: Glucose-Capillary: 228 mg/dL — ABNORMAL HIGH (ref 70–99)

## 2021-03-28 MED ORDER — SODIUM CHLORIDE 0.9 % IV BOLUS
1000.0000 mL | Freq: Once | INTRAVENOUS | Status: AC
  Administered 2021-03-28: 1000 mL via INTRAVENOUS

## 2021-03-28 MED ORDER — INSULIN DETEMIR 100 UNIT/ML FLEXPEN: 38.0000 [IU] | PEN_INJECTOR | Freq: Two times a day (BID) | SUBCUTANEOUS | Status: DC

## 2021-03-28 NOTE — ED Notes (Signed)
Pt currently has no urinary output via purewick, will monitor.

## 2021-03-28 NOTE — Progress Notes (Signed)
No appropriate beds at this time per [facility].   Patient meets inpatient criteria per Oneida Alar, NP. Patient referred to the following facilities:  Destination Service Provider Request Status Selected Services Address Phone Fax Patient Preferred  [facility]  Pending - Request Sent N/A [address] [place] [address] [place] --  [facility]  Pending - Request Sent N/A [address] [place] [address] [phone] --  [facility]  Pending - Request Sent N/A [address], [place] [address] [phone] --  [place] [place] [address] [phone] --  [facility]  Pending - Request Sent N/A [address], [place] [address] [phone] --  [facility]  Pending - Request Sent N/A [address] [place] [address] [phone] --  [facility]  Pending - Request Sent N/A [address] [place] [address] [phone] --  [facility]  Pending - Request Sent N/A [place] [place] [address] [phone] --  [facility]  Pending - Request Sent N/A [address] [place] [address] [phone] --  [facility]  Pending - Request Sent N/A [address] [place] [address] [phone] --  [place] [facility] [place], [place] [address] [phone] --    CSW will continue to monitor disposition.   Signed:  Durenda Hurt, MSW, [place], LCASA 03/28/2021 9:09 PM

## 2021-03-28 NOTE — ED Notes (Signed)
This nurse spoke with poison control, whose recommendations are as follows for Flexeril overdose:  Monitoring for CNS depression, hallucinations, agitation, confusion, tachycardia, hypertension, and seizures.  Poison control recommends monitoring pt x6hrs from time of arrival to the ED given the pt's unknown ingestion time.  As well as adding IV fluids, and benzos as needed given the risk of agitation and seizures.   Pt's prolonged Qtc interval discussed with poison control who recommends maintaining a Magnesium level of around 2.0 and Potassium level around 4.0, to achieve a Qtc in the mid 400's or lower.   On assessment pt's pupils are pinpoint and pt's BP is hypotensive with a systolic BP in the high 84'Y to low 90's. In the event additional drugs are "on board" will follow-up with poison control to discuss additional recommendations.   The above with discussed and verified with Deno Etienne, PA-C.
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
## 2021-03-28 NOTE — ED Notes (Signed)
Pt continually yelling at staff. Pt refusing to cooperate with staff. Melina Copa, MD made aware; Carlisle Cater, PA bedside. Security bedside
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
## 2021-03-28 NOTE — ED Notes (Addendum)
Dave with TTS at the bedside, pt uncooperative, Waunita Schooner to re-assess.

## 2021-03-28 NOTE — ED Notes (Signed)
PA-C at the bedside.  ?

## 2021-03-28 NOTE — ED Notes (Signed)
This nurse spoke with Magda Paganini, RN at [employer] regarding pt status.  Discussed Acetaminophen level, Salicylate level, EtOH level, AST, and ALT, all within normal limits. Magnesium and Potassium within normal limits as well. Repeat EKG obtained shows a Qtc interval decreased from 538 to 501. Discussed with Deno Etienne, PA-C. Will hold supplmental Mag and Potassium at this time per Letta Moynahan recommendation and will continue to monitor pt/cardiac rhythm. Will continue to monitor pt until back to baseline mental status per poison control recommendations.
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
## 2021-03-28 NOTE — ED Notes (Signed)
Pt has a white shirt, gray shorts, pair of crocs, purses, cell phone with charger and a pink case with a gold necklace. Pt has belonging in 16-18 cabinets.

## 2021-03-28 NOTE — ED Notes (Signed)
This nurse spoke with Poison Control regarding pt status. Pt is now A&O x4, back to baseline mental status with stable VS and a Qtc interval of 493. Poison control feels comfortable clearing pt and this was also verified with PA-C Deno Etienne.

## 2021-03-28 NOTE — ED Notes (Signed)
TTS assessment in progress. 

## 2021-03-28 NOTE — ED Notes (Signed)
Accepting physician Dr. Raechel Ache A Unit [facility] (Bed available after 0700) [phone] Check in at [facility] Fax #:  [phone]

## 2021-03-28 NOTE — ED Notes (Signed)
Pt now under IVC.

## 2021-03-28 NOTE — ED Provider Notes (Signed)
4:47 PM signout from [employer] at shift change.  Patient brought in today for altered mental status.  She was initially nonresponsive.  It is thought that she took an overdose of Flexeril.  Unknown reasoning for this.  Unclear if she ingested other substances.  Patient becoming angry and yelling in the room.  She is wanting to leave the emergency department.  She is angry that it has taken 3 hours and she has not yet been evaluated.  Involuntary commitment paperwork was executed based on presentation.  Would like patient to be evaluated by psychiatry given concern for intentional self-harm.  BP 107/89   Pulse 100   Temp 97.6 F (36.4 C) (Axillary)   Resp (!) 23   SpO2 100%   5:31 PM Patient has been evaluated by psychiatry.  They recommend admission for inpatient treatment.  Will begin to look for placement but will probably be in the emergency department overnight.  She will be reassessed in the morning.     Carlisle Cater, PA-C 03/28/21 1731    Hayden Rasmussen, MD 03/29/21 1049

## 2021-03-29 DIAGNOSIS — F332 Major depressive disorder, recurrent severe without psychotic features: Secondary | ICD-10-CM | POA: Diagnosis not present

## 2021-03-29 LAB — RESP PANEL BY RT-PCR (FLU A&B, COVID) ARPGX2
Influenza A by PCR: NEGATIVE
Influenza B by PCR: NEGATIVE
SARS Coronavirus 2 by RT PCR: NEGATIVE

## 2021-03-29 LAB — RAPID URINE DRUG SCREEN, HOSP PERFORMED
Amphetamines: NOT DETECTED
Barbiturates: NOT DETECTED
Benzodiazepines: NOT DETECTED
Cocaine: NOT DETECTED
Opiates: NOT DETECTED
Tetrahydrocannabinol: POSITIVE — AB

## 2021-03-29 NOTE — ED Notes (Signed)
Called Sheriff's Department for transportation to [facility], no answer, message left with details of transfer and contact number for ED.

## 2021-03-29 NOTE — Progress Notes (Signed)
Per Suzzanne Cloud, patient meets criteria for inpatient treatment. There are no available or appropriate beds at [facility] today. CSW faxed referrals to the following facilities for review:  [place] N/A [address], [place] [address] [place] [address] [phone] --  [facility]  Pending - Request Sent N/A [address] [place] [address] [phone] --  [facility]  Pending - Request Sent N/A [address] [place] [address] [place] --  [facility]  Pending - Request Sent N/A [address], [place] [address] [phone] --  [facility]  Pending - Request Sent N/A [address] [place] [address] [phone] --  [facility]  Pending - Request Sent N/A [address] [place] [address] [phone] --  [facility]  Pending - Request Sent N/A [address] [place] [address] [phone] --  [facility]  Pending - Request Sent N/A [address] [place] [address] [phone] --  [facility]  Pending - Request Sent N/A [address] [place] [address] [phone] --  [place] N/A [address], [place] [address] [phone] --  [facility]  Pending - No Request Sent N/A [address] [place] [address] [phone] --  [place]  Pending - No Request Sent N/A [address] [place] [address] [phone] --  [facility]  Pending - No Request Sent N/A [address]  [place] [address] [phone] --  [facility]  Pending - No Request Sent N/A [address] [place], [place] [address] [phone] --  [facility]  Pending - No Request Sent N/A [address] [place] [address] [phone] --  [facility]  Pending - No Request Sent N/A [place], [place] [address] [phone] --  [facility]  Pending - No Request Sent N/A [address], [place] [address] [phone] --  [facility]  Pending - No Request Sent N/A [address], [place] [address] [phone] --  [place] No Request Sent N/A [place] [place] [address] [phone] --   TTS will continue to seek bed placement.  Glennie [place], MSW, [place], LCAS-A Phone: [phone] Disposition/TOC

## 2021-03-29 NOTE — ED Notes (Addendum)
Corporal Penrod notified and aware of pt transport to [facility]. Transport ETA around 0915 this morning.

## 2021-03-29 NOTE — ED Provider Notes (Signed)
Emergency Medicine Observation Re-evaluation Note  Ruth [name] is a 45 y.o. female, seen on rounds today.  Pt initially presented to the ED for complaints of Drug Overdose Currently, the patient is resting comfortably.  Physical Exam  BP 110/66 (BP Location: Left Arm)   Pulse 91   Temp 97.9 F (36.6 C) (Oral)   Resp 20   SpO2 98%  Physical Exam General: Nontoxic appearance Cardiac: Normal heart rate Lungs: Normal respiratory rate Psych: Not responding to internal stimuli  ED Course / MDM  EKG:EKG Interpretation  Date/Time:  Friday March 28 2021 07:18:30 EDT Ventricular Rate:  105 PR Interval:  139 QRS Duration: 84 QT Interval:  407 QTC Calculation: 538 R Axis:   0 Text Interpretation: Sinus tachycardia Minimal ST depression, inferior leads Prolonged QT interval Baseline wander in lead(s) II III aVL aVF V1 V2 V6 Poor data quality Confirmed by Dorie Rank [phone]) on 03/28/2021 7:35:41 AM  I have reviewed the labs performed to date as well as medications administered while in observation.  Recent changes in the last 24 hours include blood sugar improved after treatment with insulin.  She has calm down and is now requiring aggressive treatment.  Plan  Current plan is for transfer to psychiatric hospital. Ruth [name] is under involuntary commitment.      Daleen Bo, MD 03/29/21 [phone]

## 2021-03-29 NOTE — ED Notes (Signed)
Report called to receiving staff member at [facility]:  Narda Bonds

## 2021-03-29 NOTE — ED Notes (Signed)
EMTALA information copied into chart a second time creating duplicate due to printing issue. Original EMTALA finished at 0935.

## 2021-03-29 NOTE — ED Notes (Signed)
Sheriff's office here to take pt.

## 2021-03-29 NOTE — ED Notes (Signed)
Breakfast tray given. °

## 2021-03-30 DIAGNOSIS — F332 Major depressive disorder, recurrent severe without psychotic features: Secondary | ICD-10-CM | POA: Diagnosis not present

## 2021-03-31 DIAGNOSIS — F332 Major depressive disorder, recurrent severe without psychotic features: Secondary | ICD-10-CM | POA: Diagnosis not present

## 2021-04-01 DIAGNOSIS — F332 Major depressive disorder, recurrent severe without psychotic features: Secondary | ICD-10-CM | POA: Diagnosis not present

## 2021-04-02 DIAGNOSIS — F332 Major depressive disorder, recurrent severe without psychotic features: Secondary | ICD-10-CM | POA: Diagnosis not present

## 2021-04-03 DIAGNOSIS — F332 Major depressive disorder, recurrent severe without psychotic features: Secondary | ICD-10-CM | POA: Diagnosis not present

## 2021-04-07 ENCOUNTER — Ambulatory Visit (HOSPITAL_COMMUNITY)
Admission: EM | Admit: 2021-04-07 | Discharge: 2021-04-07 | Disposition: A | Discharge status: Home / Self Care | Payer: Medicaid Other | Attending: Student | Admitting: Student

## 2021-04-07 ENCOUNTER — Encounter (HOSPITAL_COMMUNITY): Payer: Self-pay | Admitting: Emergency Medicine

## 2021-04-07 ENCOUNTER — Other Ambulatory Visit: Payer: Self-pay

## 2021-04-07 DIAGNOSIS — M25532 Pain in left wrist: Secondary | ICD-10-CM

## 2021-04-07 DIAGNOSIS — M778 Other enthesopathies, not elsewhere classified: Secondary | ICD-10-CM | POA: Diagnosis not present

## 2021-04-07 NOTE — Discharge Instructions (Addendum)
-  Tylenol/ibuprofen, rest, ice -Follow-up with your orthopedist if symptoms persist -Use your wrist brace while pain persists

## 2021-04-07 NOTE — ED Provider Notes (Signed)
[name]    CSN: [id number] Arrival date & time: 04/07/21  0930      History   Chief Complaint Chief Complaint  Patient presents with   Wrist Pain    HPI Ruth [name] is a 45 y.o. female presenting with L wrist pain x4 days, nontraumatic.  Medical history spinal stenosis, morbid obesity, fibromyalgia. This patient did have cervical spinal surgery 7/14 but no issues with L wrist until 4 days ago.  She describes diffuse pain over the radiocarpal joint for about 4 days, worse with movement.  Difficult to drive and work because of this.  Denies trauma but does endorse overuse job.  Denies sensation changes.  She is right-handed.  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   BMI 45.0-49.9, adult ([place]) 09/04/2020   Right arm pain 02/14/2020   Hyperlipidemia associated with type 2 diabetes mellitus ([place]) 09/19/2019   Abnormal uterine bleeding (AUB) 12/07/2017   Abnormal mammogram of both breasts 11/02/2016   Hypothyroidism 06/21/2009   Type 2 diabetes mellitus with peripheral neuropathy ([place]) 06/21/2009   BIPOLAR AFFECTIVE DISORDER 06/21/2009   PERSISTENT DISORDER INITIATING/MAINTAINING SLEEP 06/21/2009   INADEQUATE SLEEP HYGIENE 06/21/2009   OBSTRUCTIVE SLEEP APNEA 06/21/2009   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   CARPAL TUNNEL SYNDROME 06/20/2009   MERALGIA PARESTHETICA 06/20/2009   Essential hypertension 06/20/2009    FIBROMYALGIA 06/20/2009   CEPHALGIA 06/20/2009    Past Surgical History:  Procedure Laterality Date   ANTERIOR CERVICAL DECOMP/DISCECTOMY FUSION N/A 01/16/2021   Procedure: Cervical five-six Anterior cervical decompression/discectomy/fusion;  Surgeon: Consuella Lose, MD;  Location: [facility];  Service: Neurosurgery;  Laterality: N/A;   CARPAL TUNNEL RELEASE Right    CESAREAN SECTION     x2   DILITATION & CURRETTAGE/HYSTROSCOPY WITH NOVASURE ABLATION N/A 12/07/2017   Procedure: DILATATION & CURETTAGE/HYSTEROSCOPY WITH ATTEMPED NOVASURE ABLATION, FAILED/ENDOMETERIAL ABLATION WITH HTA SYSTEM;  Surgeon: Lavonia Drafts, MD;  Location: [facility];  Service: Gynecology;  Laterality: N/A;   ELBOW SURGERY     "surgery for nerve pain"   GANGLION CYST EXCISION Right    right arm    RECTOVAGINAL FISTULA CLOSURE     after vag delivery   TONSILLECTOMY     childhood   TUBAL LIGATION      OB History     Gravida  7   Para  5   Term  4   Preterm  1   AB  2   Living  5      SAB  2   IAB      Ectopic      Multiple      Live Births  5        Obstetric Comments  Vag  x 2>c/s emergency>vbac>c/s and BTL 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 Authorizing Provider  albuterol (ACCUNEB) 0.63 MG/3ML nebulizer solution Take 3 mLs (0.63 mg total) by nebulization every 4 (four) hours as needed for wheezing or shortness of breath. 11/20/20  Yes Hawks, Christy A, FNP  albuterol (VENTOLIN HFA) 108 (90 Base) MCG/ACT inhaler Inhale 1-2 puffs into the lungs every 6 (six) hours as needed for wheezing. 01/25/20  Yes Nicolette Bang, MD  amLODipine (NORVASC) 5 MG tablet Take 1 tablet (5 mg total) by mouth daily. 04/25/20  Yes Nicolette Bang, MD  atorvastatin (LIPITOR) 80 MG tablet Take 1 tablet (80 mg total) by mouth daily at 6 PM. 04/25/20  Yes  Nicolette Bang, MD  cetirizine (ZYRTEC) 10 MG tablet Take 1 tablet (10 mg total) by mouth daily. 01/17/21  Yes Consuella Lose, MD  fluticasone (FLONASE) 50 MCG/ACT nasal spray Place 1-2 sprays into both nostrils daily. 01/17/21  Yes Consuella Lose, MD  glipiZIDE (GLUCOTROL XL) 10 MG 24 hr tablet Take 1 tablet (10 mg total) by mouth daily with breakfast. 04/25/20  Yes Nicolette Bang, MD  insulin detemir (LEVEMIR) 100 UNIT/ML FlexPen Inject 38 Units into the skin 2 (two) times daily. 04/25/20  Yes Nicolette Bang, MD  sitaGLIPtin (JANUVIA) 100 MG tablet Take 1 tablet (100 mg total) by mouth daily. 04/25/20  Yes Nicolette Bang, MD  triamterene-hydrochlorothiazide (MAXZIDE-25) 37.5-25 MG tablet Take 1 tablet by mouth daily. 04/25/20  Yes Nicolette Bang, MD  Accu-Chek FastClix Lancets MISC E1 1.65, FOUR BLOOD GLUCOSE TESTING FASTING ANND TWO HOURS AFTER SUPPER 07/01/18   [provider]  ACCU-CHEK GUIDE test strip CHECK BLOOD SUGARS TWICE, E11.65 12/21/18   [provider]  cyclobenzaprine (FLEXERIL) 10 MG tablet Take 1 tablet (10 mg total) by mouth 2 (two) times daily as needed for muscle spasms. 12/22/20   Raspet, Derry Skill, PA-C  diclofenac (CATAFLAM) 50 MG tablet Take 50 mg by mouth 2 (two) times daily as needed for pain. 02/27/21   [provider]  diphenhydrAMINE (BENADRYL) 50 MG tablet Take 1 tablet (50 mg total) by mouth daily as needed for itching. 01/17/21   Consuella Lose, MD  fluconazole (DIFLUCAN) 150 MG tablet Take 1 tablet (150 mg total) by mouth daily. -For your yeast infection, start the Diflucan (fluconazole)- Take one pill today (day 1). If you're still having symptoms in 3 days, take the second pill. 02/12/21   Hazel Sams, PA-C  Insulin Pen Needle (PEN NEEDLES) 32G X 4 MM MISC 1 each by Does not apply route 2 (two) times daily. 01/25/20   Nicolette Bang, MD  naproxen (NAPROSYN) 500 MG tablet  Take 1 tablet (500 mg total) by mouth 2 (two) times daily. 12/22/20   Raspet, Derry Skill, PA-C  oxyCODONE (OXY IR/ROXICODONE) 5 MG immediate release tablet Take 5 mg by mouth every 8 (eight) hours as needed for moderate pain. 02/06/21   [provider]    Family History Family History  Problem Relation Age of Onset   Thyroid disease Mother    Diabetes Daughter    Diabetes Son    Breast cancer Neg Hx     Social History Social History   Tobacco Use   Smoking status: Every Day    Packs/day: 0.25  Types: Cigarettes   Smokeless tobacco: Never  Vaping Use   Vaping Use: Never used  Substance Use Topics   Alcohol use: Yes    Comment: occasionally   Drug use: Yes    Types: Marijuana    Comment: Last use on 01/11/21     Allergies   Metformin and related, Penicillins, and Victoza [liraglutide]   Review of Systems Review of Systems  Musculoskeletal:        L wrist pain  All other systems reviewed and are negative.   Physical Exam Triage Vital Signs ED Triage Vitals  Enc Vitals Group     BP 04/07/21 1006 (!) 147/95     Pulse Rate 04/07/21 1006 97     Resp --      Temp 04/07/21 1006 98.3 F (36.8 C)     Temp Source 04/07/21 1006 Oral     SpO2 04/07/21 1006 97 %     Weight --      Height --      Head Circumference --      Peak Flow --      Pain Score 04/07/21 1004 8     Pain Loc --      Pain Edu? --      Excl. in [place]? --    No data found.  Updated Vital Signs BP (!) 147/95 (BP Location: Right Arm)   Pulse 97   Temp 98.3 F (36.8 C) (Oral)   LMP 03/17/2021   SpO2 97%   Visual Acuity Right Eye Distance:   Left Eye Distance:   Bilateral Distance:    Right Eye Near:   Left Eye Near:    Bilateral Near:     Physical Exam Vitals reviewed.  Constitutional:      General: She is not in acute distress.    Appearance: Normal appearance. She is obese. She is not ill-appearing or diaphoretic.  HENT:     Head: Normocephalic and atraumatic.  Cardiovascular:      Rate and Rhythm: Normal rate and regular rhythm.     Heart sounds: Normal heart sounds.  Pulmonary:     Effort: Pulmonary effort is normal.     Breath sounds: Normal breath sounds.  Musculoskeletal:     Comments: L wrist- diffusely TTP radiocarpal joint, without effusion or skin changes. Sensation intact. Grip strength 5/5. No snuffbox tenderness. Radial pulse 2+, cap refill <2 seconds. No obvious bony deforomity.  Skin:    General: Skin is warm.  Neurological:     General: No focal deficit present.     Mental Status: She is alert and oriented to person, place, and time.  Psychiatric:        Mood and Affect: Mood normal.        Behavior: Behavior normal.        Thought Content: Thought content normal.        Judgment: Judgment normal.     UC Treatments / Results  Labs (all labs ordered are listed, but only abnormal results are displayed) Labs Reviewed - No data to display  EKG   Radiology No results found.  Procedures Procedures (including critical care time)  Medications Ordered in UC Medications - No data to display  Initial Impression / Assessment and Plan / UC Course  I have reviewed the triage vital signs and the nursing notes.  Pertinent labs & imaging results that were available during my care of the patient were reviewed by me and considered in my medical  decision making (see chart for details).     This patient is a very pleasant 45 y.o. year old female presenting with L wrist tendonitis related to overuse. Neurovascularly intact. Trial of wrist brace, RICE. She is in agreement that xray not medically necessary at this time. F/u with ortho if symptoms persist, she has an orthopedist already. Work note provided. ED return precautions discussed. Patient verbalizes understanding and agreement.    Final Clinical Impressions(s) / UC Diagnoses   Final diagnoses:  Left wrist tendonitis     Discharge Instructions      -Tylenol/ibuprofen, rest,  ice -Follow-up with your orthopedist if symptoms persist -Use your wrist brace while pain persists     ED Prescriptions   None    PDMP not reviewed this encounter.   Hazel Sams, PA-C 04/07/21 1055

## 2021-04-07 NOTE — ED Triage Notes (Signed)
Pt c/o left wrist pain x 4 days. No known injury.

## 2021-04-09 DIAGNOSIS — M25532 Pain in left wrist: Secondary | ICD-10-CM | POA: Diagnosis not present

## 2021-04-16 DIAGNOSIS — Z20828 Contact with and (suspected) exposure to other viral communicable diseases: Secondary | ICD-10-CM | POA: Diagnosis not present

## 2021-05-06 ENCOUNTER — Other Ambulatory Visit: Payer: Self-pay

## 2021-05-06 ENCOUNTER — Encounter (HOSPITAL_COMMUNITY): Payer: Self-pay

## 2021-05-06 ENCOUNTER — Ambulatory Visit (HOSPITAL_COMMUNITY)
Admission: EM | Admit: 2021-05-06 | Discharge: 2021-05-06 | Disposition: A | Discharge status: Home / Self Care | Payer: Medicaid Other | Attending: Student | Admitting: Student

## 2021-05-06 DIAGNOSIS — B3731 Acute candidiasis of vulva and vagina: Secondary | ICD-10-CM

## 2021-05-06 DIAGNOSIS — J069 Acute upper respiratory infection, unspecified: Secondary | ICD-10-CM | POA: Diagnosis not present

## 2021-05-06 DIAGNOSIS — B372 Candidiasis of skin and nail: Secondary | ICD-10-CM

## 2021-05-06 DIAGNOSIS — J4541 Moderate persistent asthma with (acute) exacerbation: Secondary | ICD-10-CM

## 2021-05-06 DIAGNOSIS — J4520 Mild intermittent asthma, uncomplicated: Secondary | ICD-10-CM

## 2021-05-06 MED ORDER — ALBUTEROL SULFATE 0.63 MG/3ML IN NEBU: 1.0000 | INHALATION_SOLUTION | RESPIRATORY_TRACT | 0 refills | Status: DC | PRN

## 2021-05-06 MED ORDER — PREDNISONE 20 MG PO TABS: 40.0000 mg | ORAL_TABLET | Freq: Every day | ORAL | 0 refills | Status: AC

## 2021-05-06 MED ORDER — ALBUTEROL SULFATE HFA 108 (90 BASE) MCG/ACT IN AERS: 1.0000 | INHALATION_SPRAY | Freq: Four times a day (QID) | RESPIRATORY_TRACT | 5 refills | Status: DC | PRN

## 2021-05-06 MED ORDER — FLUCONAZOLE 150 MG PO TABS: 150.0000 mg | ORAL_TABLET | Freq: Every day | ORAL | 0 refills | Status: DC

## 2021-05-06 MED ORDER — PROMETHAZINE-DM 6.25-15 MG/5ML PO SYRP: 5.0000 mL | ORAL_SOLUTION | Freq: Four times a day (QID) | ORAL | 0 refills | Status: DC | PRN

## 2021-05-06 NOTE — ED Triage Notes (Signed)
Pt c/o cough, congestion, and headaches since Sunday. States had a neg home covid test  Pt states thinks she has a yeast infection under both breast, c/o itching.

## 2021-05-06 NOTE — Discharge Instructions (Addendum)
-  Albuterol inhaler or nebulizer as needed for cough, wheezing, shortness of breath, 1 to 2 puffs every 6 hours as needed. -Prednisone, 2 pills taken at the same time for 5 days in a row.  Try taking this earlier in the day as it can give you energy. Avoid NSAIDs like ibuprofen and alleve while taking this medication as they can increase your risk of stomach upset and even GI bleeding when in combination with a steroid. You can continue tylenol (acetaminophen) up to 1000mg  3x daily. -Promethazine DM cough syrup for congestion/cough. This could make you drowsy, so take at night before bed. -For your yeast infection, start the Diflucan (fluconazole)- Take one pill today (day 1). If you're still having symptoms in 3 days, take the second pill.  -With a virus, you're typically contagious for 5-7 days, or as long as you're having fevers.

## 2021-05-06 NOTE — ED Provider Notes (Signed)
[facility]    CSN: [id number] Arrival date & time: 05/06/21  1317      History   Chief Complaint Chief Complaint  Patient presents with   Cough    HPI Roshanda Balazs is a 45 y.o. female presenting with viral syndrome and yeast infection.  Medical history asthma that is typically well controlled on albuterol inhaler and nebulizer, diabetes.  Patient describes 3 days of cough, congestion, throbbing headaches, generalized body aches.  Albuterol inhalers providing relief but she needs a refill on this.  Cough is nonproductive.  Has not monitored temperature at home.  Negative home COVID test.  Tolerating fluids and food.  Also with itchy rash under breasts and vaginal itching for about 2 days.  Denies vaginal discharge, new partners, dysuria, abdominal pain, flank pain. Tubal ligation for contraception.  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   BMI 45.0-49.9, adult ([place]) 09/04/2020   Right arm pain 02/14/2020   Hyperlipidemia associated with type 2 diabetes mellitus ([place]) 09/19/2019   Abnormal uterine bleeding (AUB) 12/07/2017   Abnormal mammogram of both breasts 11/02/2016   Hypothyroidism 06/21/2009   Type 2 diabetes mellitus with peripheral neuropathy ([place]) 06/21/2009   BIPOLAR AFFECTIVE DISORDER 06/21/2009   PERSISTENT DISORDER INITIATING/MAINTAINING SLEEP 06/21/2009   INADEQUATE SLEEP HYGIENE 06/21/2009    OBSTRUCTIVE SLEEP APNEA 06/21/2009   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   CARPAL TUNNEL SYNDROME 06/20/2009   MERALGIA PARESTHETICA 06/20/2009   Essential hypertension 06/20/2009   FIBROMYALGIA 06/20/2009   CEPHALGIA 06/20/2009    Past Surgical History:  Procedure Laterality Date   ANTERIOR CERVICAL DECOMP/DISCECTOMY FUSION N/A 01/16/2021   Procedure: Cervical five-six Anterior cervical decompression/discectomy/fusion;  Surgeon: Consuella Lose, MD;  Location: [facility];  Service: Neurosurgery;  Laterality: N/A;   CARPAL TUNNEL RELEASE Right    CESAREAN SECTION     x2   DILITATION & CURRETTAGE/HYSTROSCOPY WITH NOVASURE ABLATION N/A 12/07/2017   Procedure: DILATATION & CURETTAGE/HYSTEROSCOPY WITH ATTEMPED NOVASURE ABLATION, FAILED/ENDOMETERIAL ABLATION WITH HTA SYSTEM;  Surgeon: Lavonia Drafts, MD;  Location: [facility];  Service: Gynecology;  Laterality: N/A;   ELBOW SURGERY     "surgery for nerve pain"   GANGLION CYST EXCISION Right    right arm    RECTOVAGINAL FISTULA CLOSURE     after vag delivery   TONSILLECTOMY     childhood   TUBAL LIGATION      OB History     Gravida  7   Para  5   Term  4   Preterm  1   AB  2   Living  5      SAB  2   IAB      Ectopic  Multiple      Live Births  5        Obstetric Comments  Vag x 2>c/s emergency>vbac>c/s and BTL 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 Authorizing Provider  fluconazole (DIFLUCAN) 150 MG tablet Take 1 tablet (150 mg total) by mouth daily. -For your yeast infection, start the Diflucan (fluconazole)- Take one pill today (day 1). If you're still having symptoms in 3 days, take the second pill. 05/06/21  Yes Hazel Sams, PA-C  predniSONE (DELTASONE) 20 MG tablet Take 2 tablets (40 mg total) by mouth daily for 5 days. Take with breakfast or lunch. Avoid NSAIDs (ibuprofen, etc)  while taking this medication. 05/06/21 05/11/21 Yes Hazel Sams, PA-C  promethazine-dextromethorphan (PROMETHAZINE-DM) 6.25-15 MG/5ML syrup Take 5 mLs by mouth 4 (four) times daily as needed for cough. 05/06/21  Yes Hazel Sams, PA-C  Accu-Chek FastClix Lancets MISC E1 1.65, FOUR BLOOD GLUCOSE TESTING FASTING ANND TWO HOURS AFTER SUPPER 07/01/18   [provider]  ACCU-CHEK GUIDE test strip CHECK BLOOD SUGARS TWICE, E11.65 12/21/18   [provider]  albuterol (ACCUNEB) 0.63 MG/3ML nebulizer solution Take 3 mLs (0.63 mg total) by nebulization every 4 (four) hours as needed for wheezing or shortness of breath. 05/06/21   Hazel Sams, PA-C  albuterol (VENTOLIN HFA) 108 (90 Base) MCG/ACT inhaler Inhale 1-2 puffs into the lungs every 6 (six) hours as needed for wheezing. 05/06/21   Hazel Sams, PA-C  amLODipine (NORVASC) 5 MG tablet Take 1 tablet (5 mg total) by mouth daily. 04/25/20   Nicolette Bang, MD  atorvastatin (LIPITOR) 80 MG tablet Take 1 tablet (80 mg total) by mouth daily at 6 PM. 04/25/20   Nicolette Bang, MD  cetirizine (ZYRTEC) 10 MG tablet Take 1 tablet (10 mg total) by mouth daily. 01/17/21   Consuella Lose, MD  cyclobenzaprine (FLEXERIL) 10 MG tablet Take 1 tablet (10 mg total) by mouth 2 (two) times daily as needed for muscle spasms. 12/22/20   Raspet, Derry Skill, PA-C  diclofenac (CATAFLAM) 50 MG tablet Take 50 mg by mouth 2 (two) times daily as needed for pain. 02/27/21   [provider]  diphenhydrAMINE (BENADRYL) 50 MG tablet Take 1 tablet (50 mg total) by mouth daily as needed for itching. 01/17/21   Consuella Lose, MD  fluticasone (FLONASE) 50 MCG/ACT nasal spray Place 1-2 sprays into both nostrils daily. 01/17/21   Consuella Lose, MD  glipiZIDE (GLUCOTROL XL) 10 MG 24 hr tablet Take 1 tablet (10 mg total) by mouth daily with breakfast. 04/25/20   Nicolette Bang, MD  insulin detemir (LEVEMIR) 100 UNIT/ML  FlexPen Inject 38 Units into the skin 2 (two) times daily. 04/25/20   Nicolette Bang, MD  Insulin Pen Needle (PEN NEEDLES) 32G X 4 MM MISC 1 each by Does not apply route 2 (two) times daily. 01/25/20   Nicolette Bang, MD  naproxen (NAPROSYN) 500 MG tablet Take 1 tablet (500 mg total) by mouth 2 (two) times daily. 12/22/20   Raspet, Derry Skill, PA-C  oxyCODONE (OXY IR/ROXICODONE) 5 MG immediate release tablet Take 5 mg by mouth every 8 (eight) hours as needed for moderate pain. 02/06/21   [provider]  sitaGLIPtin (JANUVIA) 100 MG tablet Take 1 tablet (100 mg total) by mouth daily. 04/25/20  Nicolette Bang, MD  triamterene-hydrochlorothiazide (MAXZIDE-25) 37.5-25 MG tablet Take 1 tablet by mouth daily. 04/25/20   Nicolette Bang, MD    Family History Family History  Problem Relation Age of Onset   Thyroid disease Mother    Diabetes Daughter    Diabetes Son    Breast cancer Neg Hx     Social History Social History   Tobacco Use   Smoking status: Every Day    Packs/day: 0.25    Types: Cigarettes   Smokeless tobacco: Never  Vaping Use   Vaping Use: Never used  Substance Use Topics   Alcohol use: Yes    Comment: occasionally   Drug use: Yes    Types: Marijuana    Comment: Last use on 01/11/21     Allergies   Metformin and related, Penicillins, and Victoza [liraglutide]   Review of Systems Review of Systems  Constitutional:  Negative for appetite change, chills and fever.  HENT:  Positive for congestion. Negative for ear pain, rhinorrhea, sinus pressure, sinus pain and sore throat.   Eyes:  Negative for pain, redness and visual disturbance.  Respiratory:  Positive for cough. Negative for chest tightness, shortness of breath and wheezing.   Cardiovascular:  Negative for chest pain and palpitations.  Gastrointestinal:  Negative for abdominal pain, constipation, diarrhea, nausea and vomiting.  Genitourinary:  Negative for decreased  urine volume, difficulty urinating, dysuria, flank pain, frequency, genital sores, hematuria, menstrual problem, pelvic pain, urgency, vaginal bleeding, vaginal discharge and vaginal pain.  Musculoskeletal:  Negative for back pain and myalgias.  Skin:  Positive for rash.  Neurological:  Negative for dizziness, weakness and headaches.  Psychiatric/Behavioral:  Negative for confusion.   All other systems reviewed and are negative.   Physical Exam Triage Vital Signs ED Triage Vitals  Enc Vitals Group     BP 05/06/21 1507 (!) 161/100     Pulse Rate 05/06/21 1507 91     Resp 05/06/21 1507 18     Temp 05/06/21 1507 98.2 F (36.8 C)     Temp Source 05/06/21 1507 Oral     SpO2 05/06/21 1507 97 %     Weight --      Height --      Head Circumference --      Peak Flow --      Pain Score 05/06/21 1508 7     Pain Loc --      Pain Edu? --      Excl. in [place]? --    No data found.  Updated Vital Signs BP (!) 161/100 (BP Location: Left Arm)   Pulse 91   Temp 98.2 F (36.8 C) (Oral)   Resp 18   SpO2 97%   Visual Acuity Right Eye Distance:   Left Eye Distance:   Bilateral Distance:    Right Eye Near:   Left Eye Near:    Bilateral Near:     Physical Exam Vitals reviewed.  Constitutional:      General: She is not in acute distress.    Appearance: Normal appearance. She is obese. She is not ill-appearing.  HENT:     Head: Normocephalic and atraumatic.     Right Ear: Tympanic membrane, ear canal and external ear normal. No tenderness. No middle ear effusion. There is no impacted cerumen. Tympanic membrane is not perforated, erythematous, retracted or bulging.     Left Ear: Tympanic membrane, ear canal and external ear normal. No tenderness.  No middle ear effusion.  There is no impacted cerumen. Tympanic membrane is not perforated, erythematous, retracted or bulging.     Nose: Nose normal. No congestion.     Mouth/Throat:     Mouth: Mucous membranes are moist.     Pharynx: Uvula  midline. No oropharyngeal exudate or posterior oropharyngeal erythema.  Eyes:     Extraocular Movements: Extraocular movements intact.     Pupils: Pupils are equal, round, and reactive to light.  Cardiovascular:     Rate and Rhythm: Normal rate and regular rhythm.     Heart sounds: Normal heart sounds.  Pulmonary:     Effort: Pulmonary effort is normal.     Breath sounds: Normal breath sounds. No decreased breath sounds, wheezing, rhonchi or rales.  Abdominal:     Palpations: Abdomen is soft.     Tenderness: There is no abdominal tenderness. There is no guarding or rebound.  Genitourinary:    Comments: deferred Lymphadenopathy:     Cervical: No cervical adenopathy.     Right cervical: No superficial cervical adenopathy.    Left cervical: No superficial cervical adenopathy.  Skin:    Comments: Bilateral breasts: creases with scattered beefy plaques   Neurological:     General: No focal deficit present.     Mental Status: She is alert and oriented to person, place, and time.  Psychiatric:        Mood and Affect: Mood normal.        Behavior: Behavior normal.        Thought Content: Thought content normal.        Judgment: Judgment normal.     UC Treatments / Results  Labs (all labs ordered are listed, but only abnormal results are displayed) Labs Reviewed - No data to display  EKG   Radiology No results found.  Procedures Procedures (including critical care time)  Medications Ordered in UC Medications - No data to display  Initial Impression / Assessment and Plan / UC Course  I have reviewed the triage vital signs and the nursing notes.  Pertinent labs & imaging results that were available during my care of the patient were reviewed by me and considered in my medical decision making (see chart for details).     This patient is a very pleasant 45 y.o. year old female presenting with viral URI/ asthma exacerbation, vaginal candidiasis, candidial dermatitis.  Afebrile, nontachycardic, no reproducible abd pain or CVAT. Tubal ligation for contraception.  For asthma- refilled albuterol and nebulizer. Also sent low-dose prednisone and promethazine DM as below.  For candida- diflucan as below.    ED return precautions discussed. Patient verbalizes understanding and agreement.   Level 4 for acute exacerbation of chronic condition and prescription management   Final Clinical Impressions(s) / UC Diagnoses   Final diagnoses:  Viral URI with cough  Vaginal candidiasis  Candidal dermatitis  Moderate persistent asthma with acute exacerbation     Discharge Instructions      -Albuterol inhaler or nebulizer as needed for cough, wheezing, shortness of breath, 1 to 2 puffs every 6 hours as needed. -Prednisone, 2 pills taken at the same time for 5 days in a row.  Try taking this earlier in the day as it can give you energy. Avoid NSAIDs like ibuprofen and alleve while taking this medication as they can increase your risk of stomach upset and even GI bleeding when in combination with a steroid. You can continue tylenol (acetaminophen) up to 1000mg  3x daily. -Promethazine DM cough syrup for  congestion/cough. This could make you drowsy, so take at night before bed. -For your yeast infection, start the Diflucan (fluconazole)- Take one pill today (day 1). If you're still having symptoms in 3 days, take the second pill.  -With a virus, you're typically contagious for 5-7 days, or as long as you're having fevers.      ED Prescriptions     Medication Sig Dispense Auth. Provider   albuterol (VENTOLIN HFA) 108 (90 Base) MCG/ACT inhaler Inhale 1-2 puffs into the lungs every 6 (six) hours as needed for wheezing. 18 g Hazel Sams, PA-C   albuterol (ACCUNEB) 0.63 MG/3ML nebulizer solution Take 3 mLs (0.63 mg total) by nebulization every 4 (four) hours as needed for wheezing or shortness of breath. 200 mL Hazel Sams, PA-C   fluconazole (DIFLUCAN) 150 MG  tablet Take 1 tablet (150 mg total) by mouth daily. -For your yeast infection, start the Diflucan (fluconazole)- Take one pill today (day 1). If you're still having symptoms in 3 days, take the second pill. 2 tablet Hazel Sams, PA-C   promethazine-dextromethorphan (PROMETHAZINE-DM) 6.25-15 MG/5ML syrup Take 5 mLs by mouth 4 (four) times daily as needed for cough. 118 mL Hazel Sams, PA-C   predniSONE (DELTASONE) 20 MG tablet Take 2 tablets (40 mg total) by mouth daily for 5 days. Take with breakfast or lunch. Avoid NSAIDs (ibuprofen, etc) while taking this medication. 10 tablet Hazel Sams, PA-C      PDMP not reviewed this encounter.   Hazel Sams, PA-C 05/06/21 1615

## 2021-05-08 DIAGNOSIS — M5000 Cervical disc disorder with myelopathy, unspecified cervical region: Secondary | ICD-10-CM | POA: Diagnosis not present

## 2021-06-21 ENCOUNTER — Other Ambulatory Visit: Payer: Self-pay | Admitting: Internal Medicine

## 2021-06-21 DIAGNOSIS — Z794 Long term (current) use of insulin: Secondary | ICD-10-CM

## 2021-06-21 DIAGNOSIS — E1142 Type 2 diabetes mellitus with diabetic polyneuropathy: Secondary | ICD-10-CM

## 2021-07-06 DIAGNOSIS — D649 Anemia, unspecified: Secondary | ICD-10-CM

## 2021-07-06 DIAGNOSIS — N939 Abnormal uterine and vaginal bleeding, unspecified: Secondary | ICD-10-CM

## 2021-07-06 HISTORY — DX: Abnormal uterine and vaginal bleeding, unspecified: N93.9

## 2021-07-06 HISTORY — DX: Anemia, unspecified: D64.9

## 2021-09-26 ENCOUNTER — Encounter (HOSPITAL_COMMUNITY): Payer: Self-pay

## 2021-09-26 ENCOUNTER — Other Ambulatory Visit: Payer: Self-pay

## 2021-09-26 ENCOUNTER — Ambulatory Visit (HOSPITAL_COMMUNITY)
Admission: EM | Admit: 2021-09-26 | Discharge: 2021-09-26 | Disposition: A | Discharge status: Home / Self Care | Payer: Medicaid Other

## 2021-09-26 ENCOUNTER — Emergency Department (HOSPITAL_COMMUNITY): Payer: Medicaid Other

## 2021-09-26 ENCOUNTER — Emergency Department (HOSPITAL_COMMUNITY)
Admission: EM | Admit: 2021-09-26 | Discharge: 2021-09-26 | Discharge status: Against Medical Advice | Payer: Medicaid Other | Attending: Emergency Medicine | Admitting: Emergency Medicine

## 2021-09-26 DIAGNOSIS — R0789 Other chest pain: Secondary | ICD-10-CM | POA: Diagnosis not present

## 2021-09-26 DIAGNOSIS — R079 Chest pain, unspecified: Secondary | ICD-10-CM | POA: Insufficient documentation

## 2021-09-26 DIAGNOSIS — Z5321 Procedure and treatment not carried out due to patient leaving prior to being seen by health care provider: Secondary | ICD-10-CM | POA: Diagnosis not present

## 2021-09-26 DIAGNOSIS — R200 Anesthesia of skin: Secondary | ICD-10-CM | POA: Insufficient documentation

## 2021-09-26 DIAGNOSIS — R519 Headache, unspecified: Secondary | ICD-10-CM

## 2021-09-26 DIAGNOSIS — M79602 Pain in left arm: Secondary | ICD-10-CM

## 2021-09-26 LAB — I-STAT BETA HCG BLOOD, ED (MC, WL, AP ONLY): I-stat hCG, quantitative: <5 m[IU]/mL (ref <5)

## 2021-09-26 LAB — TROPONIN I (HIGH SENSITIVITY)
Troponin I (High Sensitivity): 4 ng/L (ref <18)
Troponin I (High Sensitivity): 5 ng/L (ref <18)

## 2021-09-26 LAB — CBC
HCT: 31.6 % — ABNORMAL LOW (ref 36.0–46.0)
Hemoglobin: 9.5 g/dL — ABNORMAL LOW (ref 12.0–15.0)
MCH: 23.6 pg — ABNORMAL LOW (ref 26.0–34.0)
MCHC: 30.1 g/dL (ref 30.0–36.0)
MCV: 78.6 fL — ABNORMAL LOW (ref 80.0–100.0)
Platelets: 448 10*3/uL — ABNORMAL HIGH (ref 150–400)
RBC: 4.02 MIL/uL (ref 3.87–5.11)
RDW: 15.9 % — ABNORMAL HIGH (ref 11.5–15.5)
WBC: 5.8 10*3/uL (ref 4.0–10.5)
nRBC: 0 % (ref 0.0–0.2)

## 2021-09-26 LAB — BASIC METABOLIC PANEL
Anion gap: 5 (ref 5–15)
BUN: 7 mg/dL (ref 6–20)
CO2: 27 mmol/L (ref 22–32)
Calcium: 9 mg/dL (ref 8.9–10.3)
Chloride: 103 mmol/L (ref 98–111)
Creatinine, Ser: 0.61 mg/dL (ref 0.44–1.00)
GFR, Estimated: >60 mL/min (ref >60)
Glucose, Bld: 164 mg/dL — ABNORMAL HIGH (ref 70–99)
Potassium: 3.1 mmol/L — ABNORMAL LOW (ref 3.5–5.1)
Sodium: 135 mmol/L (ref 135–145)

## 2021-09-26 NOTE — ED Triage Notes (Signed)
Pt arrived POV from home c/o left sided numbness from her ear down to her arm. Pt also endorses CP that started this morning.  ?

## 2021-09-26 NOTE — ED Triage Notes (Signed)
Pt presents today with left side headache. Pt states pain radiates from left side of the head towards chest,  and under left axillary. Pt states no SOB. ?

## 2021-09-26 NOTE — Discharge Instructions (Signed)
-   Please go straight to the Emergency Room for further evaluation of your headache and chest pain ?

## 2021-09-26 NOTE — ED Provider Triage Note (Signed)
Emergency Medicine Provider Triage Evaluation Note ? ?Ruth [name] , a 46 y.o. female  was evaluated in triage.  Pt complains of left sided chest pain. She was having intermittent problems with her left arm where she would have severe pains and feelings of heaviness. She states that the sensation feels "off". She describes it as numbness, but she is able to feel in her left arm. She says today when she was driving she went up to stretch her left arm and started experiencing left chest discomfort and worsening arm pain. The only other time she has had chest pain before, they told her it was musculoskeletal, but she hasn't been lifting anything recently. Denies any other symptoms. Hx of cervical spine surgery last summer. ? ?Review of Systems  ?Positive: Chest pain, arm pain ?Negative:  ? ?Physical Exam  ?BP (!) 147/93 (BP Location: Right Arm)   Pulse 90   Temp 98 ?F (36.7 ?C) (Oral)   Resp 16   Ht '5\' 7"'$  (1.702 m)   Wt 135.2 kg   LMP 09/25/2021   SpO2 97%   BMI 46.67 kg/m?  ?Gen:   Awake, no distress   ?Resp:  Normal effort  ?MSK:   Moves extremities without difficulty  ?Other:  Heart RRR, no murmurs. Appears well. Pain reproducible with palpation of anterior chest and posterior back. Reproducible arm pain. Normal pulses. Sensation subjectively less in left upper ext. No other neuro deficits.  ? ?Medical Decision Making  ?Medically screening exam initiated at 3:08 PM.  Appropriate orders placed.  Ruth [name] was informed that the remainder of the evaluation will be completed by another provider, this initial triage assessment does not replace that evaluation, and the importance of remaining in the ED until their evaluation is complete. ? ?Suspect msk with nerve involvement. Chest pain labs ordered.  ?  Adolphus Birchwood, PA-C ?09/26/21 1511 ? ?

## 2021-09-26 NOTE — ED Notes (Signed)
Pt called this RN to bedside to express displeasure with wait time. Pt stated "I'm going to be home at 11 o'clock. I've been waiting all day between here and urgent care and this is ridiculous, y'all should have a fast track for stuff that isn't an emergency.Y'all sit around and wait for emergencies to come, then say the wait is because of an emergency. Its because of my insurance that y'all sit and make me wait." Pt not consolable at this time, AMA paperwork signed and pt ambulated to ED lobby with strong and steady gait. Pt refused vital signs prior to leaving ED.  ?

## 2021-09-26 NOTE — ED Provider Notes (Signed)
?[facility] ? ? ? ?CSN: [id number] ?Arrival date & time: 09/26/21  1224 ? ? ?  ? ?History   ?Chief Complaint ?Chief Complaint  ?Patient presents with  ? Headache  ? ? ?HPI ?Ruth [name] is a 46 y.o. female.  ? ?Patient reports left-sided headache that started today.  She reports the pain is rating down her left arm, under her left arm, into her left chest.  She reports the pain is worse when she takes a deep breath.  She denies any vision changes, photophobia.  She does have some numbness in her fingers and thinks this is related to her carpal tunnel syndrome. ? ?Of note, she has had neck surgery in the past year, she also has type 2 diabetes.  She reports she is not taking any medication currently. ? ? ?Past Medical History:  ?Diagnosis Date  ? Anemia   ? Anxiety   ? Arthritis   ? chronic back pain   ? Asthma   ? rarely uses inhaler  ? Bipolar 1 disorder ([place])   ? Fibromyalgia   ? GERD (gastroesophageal reflux disease)   ? Headache   ? otc med  ? HLD (hyperlipidemia)   ? diet controlled  ? Hx of migraines   ? occ  ? Hypertension   ? Hypothyroidism   ? Obesity   ? Sleep apnea   ? does not use  cpap  ? Thrombocytosis 01/26/2020  ? Type 2 diabetes mellitus ([place])   ? Type 2  ? ? ?Patient Active Problem List  ? Diagnosis Date Noted  ? 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 BMI 45.0-49.9, adult ([place]) 09/04/2020  ? Right arm pain 02/14/2020  ? Hyperlipidemia associated with type 2 diabetes mellitus ([place]) 09/19/2019  ? Abnormal uterine bleeding (AUB) 12/07/2017  ? Abnormal mammogram of both breasts 11/02/2016  ? Hypothyroidism 06/21/2009  ? Type 2 diabetes mellitus with peripheral neuropathy ([place]) 06/21/2009  ? BIPOLAR AFFECTIVE DISORDER 06/21/2009  ? PERSISTENT DISORDER INITIATING/MAINTAINING SLEEP 06/21/2009  ? INADEQUATE SLEEP HYGIENE 06/21/2009  ? OBSTRUCTIVE SLEEP APNEA 06/21/2009  ? 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  ? CARPAL TUNNEL SYNDROME 06/20/2009  ? MERALGIA  PARESTHETICA 06/20/2009  ? Essential hypertension 06/20/2009  ? FIBROMYALGIA 06/20/2009  ? CEPHALGIA 06/20/2009  ? ? ?Past Surgical History:  ?Procedure Laterality Date  ? ANTERIOR CERVICAL DECOMP/DISCECTOMY FUSION N/A 01/16/2021  ? Procedure: Cervical five-six Anterior cervical decompression/discectomy/fusion;  Surgeon: Consuella Lose, MD;  Location: [facility];  Service: Neurosurgery;  Laterality: N/A;  ? CARPAL TUNNEL RELEASE Right   ? CESAREAN SECTION    ? x2  ? DILITATION & CURRETTAGE/HYSTROSCOPY WITH NOVASURE ABLATION N/A 12/07/2017  ? Procedure: DILATATION & CURETTAGE/HYSTEROSCOPY WITH ATTEMPED NOVASURE ABLATION, FAILED/ENDOMETERIAL ABLATION WITH HTA SYSTEM;  Surgeon: Lavonia Drafts, MD;  Location: [facility];  Service: Gynecology;  Laterality: N/A;  ? ELBOW SURGERY    ? "surgery for nerve pain"  ? GANGLION CYST EXCISION Right   ? right arm   ? RECTOVAGINAL FISTULA CLOSURE    ? after vag delivery  ? TONSILLECTOMY    ? childhood  ? TUBAL LIGATION    ? ? ?OB History   ? ? Gravida  ?7  ? Para  ?5  ? Term  ?4  ? Preterm  ?1  ? AB  ?2  ? Living  ?5  ?  ? ? SAB  ?2  ? IAB  ?   ? Ectopic  ?   ? Multiple  ?   ?  Live Births  ?5  ?   ?  ? 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  ? ?  ? ? ? ?Home Medications   ? ?Prior to Admission medications   ?Medication Sig Start Date End Date Taking? Authorizing Provider  ?Accu-Chek FastClix Lancets MISC E1 1.65, FOUR BLOOD GLUCOSE TESTING FASTING ANND TWO HOURS AFTER SUPPER 07/01/18   [provider]  ?ACCU-CHEK GUIDE test strip CHECK BLOOD SUGARS TWICE, E11.65 12/21/18   [provider]  ?albuterol (ACCUNEB) 0.63 MG/3ML nebulizer solution Take 3 mLs (0.63 mg total) by nebulization every 4 (four) hours as needed for wheezing or shortness of breath. 05/06/21   Hazel Sams, PA-C  ?albuterol (VENTOLIN HFA) 108 (90 Base) MCG/ACT inhaler Inhale 1-2 puffs into the lungs every 6 (six) hours  as needed for wheezing. 05/06/21   Hazel Sams, PA-C  ?amLODipine (NORVASC) 5 MG tablet Take 1 tablet (5 mg total) by mouth daily. 04/25/20   Nicolette Bang, MD  ?atorvastatin (LIPITOR) 80 MG tablet Take 1 tablet (80 mg total) by mouth daily at 6 PM. 04/25/20   Nicolette Bang, MD  ?cetirizine (ZYRTEC) 10 MG tablet Take 1 tablet (10 mg total) by mouth daily. 01/17/21   Consuella Lose, MD  ?cyclobenzaprine (FLEXERIL) 10 MG tablet Take 1 tablet (10 mg total) by mouth 2 (two) times daily as needed for muscle spasms. 12/22/20   Raspet, Derry Skill, PA-C  ?diclofenac (CATAFLAM) 50 MG tablet Take 50 mg by mouth 2 (two) times daily as needed for pain. 02/27/21   [provider]  ?diphenhydrAMINE (BENADRYL) 50 MG tablet Take 1 tablet (50 mg total) by mouth daily as needed for itching. 01/17/21   Consuella Lose, MD  ?fluconazole (DIFLUCAN) 150 MG tablet Take 1 tablet (150 mg total) by mouth daily. -For your yeast infection, start the Diflucan (fluconazole)- Take one pill today (day 1). If you're still having symptoms in 3 days, take the second pill. 05/06/21   Hazel Sams, PA-C  ?fluticasone (FLONASE) 50 MCG/ACT nasal spray Place 1-2 sprays into both nostrils daily. 01/17/21   Consuella Lose, MD  ?glipiZIDE (GLUCOTROL XL) 10 MG 24 hr tablet Take 1 tablet (10 mg total) by mouth daily with breakfast. 04/25/20   Nicolette Bang, MD  ?insulin detemir (LEVEMIR) 100 UNIT/ML FlexPen Inject 38 Units into the skin 2 (two) times daily. 04/25/20   Nicolette Bang, MD  ?Insulin Pen Needle (PEN NEEDLES) 32G X 4 MM MISC 1 each by Does not apply route 2 (two) times daily. 01/25/20   Nicolette Bang, MD  ?naproxen (NAPROSYN) 500 MG tablet Take 1 tablet (500 mg total) by mouth 2 (two) times daily. 12/22/20   Raspet, Derry Skill, PA-C  ?oxyCODONE (OXY IR/ROXICODONE) 5 MG immediate release tablet Take 5 mg by mouth every 8 (eight) hours as needed for moderate pain. 02/06/21   [provider]  ?promethazine-dextromethorphan (PROMETHAZINE-DM) 6.25-15 MG/5ML syrup Take 5 mLs by mouth 4 (four) times daily as needed for cough. 05/06/21   Hazel Sams, PA-C  ?sitaGLIPtin (JANUVIA) 100 MG tablet Take 1 tablet (100 mg total) by mouth daily. 04/25/20   Nicolette Bang, MD  ?triamterene-hydrochlorothiazide (MAXZIDE-25) 37.5-25 MG tablet Take 1 tablet by mouth daily. 04/25/20   Nicolette Bang, MD  ? ? ?Family History ?Family History  ?Problem Relation Age of Onset  ? Thyroid disease Mother   ?  Diabetes Daughter   ? Diabetes Son   ? Breast cancer Neg Hx   ? ? ?Social History ?Social History  ? ?Tobacco Use  ? Smoking status: Every Day  ?  Packs/day: 0.25  ?  Types: Cigarettes  ? Smokeless tobacco: Never  ?Vaping Use  ? Vaping Use: Never used  ?Substance Use Topics  ? Alcohol use: Yes  ?  Comment: occasionally  ? Drug use: Yes  ?  Types: Marijuana  ?  Comment: Last use on 01/11/21  ? ? ? ?Allergies   ?Metformin and related, Penicillins, and Victoza [liraglutide] ? ? ?Review of Systems ?Review of Systems ?Per HPI ? ?Physical Exam ?Triage Vital Signs ?ED Triage Vitals  ?Enc Vitals Group  ?   BP 09/26/21 1328 (!) 143/91  ?   Pulse Rate 09/26/21 1328 89  ?   Resp 09/26/21 1328 20  ?   Temp 09/26/21 1328 98.2 ?F (36.8 ?C)  ?   Temp Source 09/26/21 1328 Oral  ?   SpO2 09/26/21 1328 97 %  ?   Weight --   ?   Height --   ?   Head Circumference --   ?   Peak Flow --   ?   Pain Score 09/26/21 1326 8  ?   Pain Loc --   ?   Pain Edu? --   ?   Excl. in [place]? --   ? ?No data found. ? ?Updated Vital Signs ?BP (!) 143/91 (BP Location: Right Arm)   Pulse 89   Temp 98.2 ?F (36.8 ?C) (Oral)   Resp 20   LMP 09/25/2021   SpO2 97%  ? ?Visual Acuity ?Right Eye Distance:   ?Left Eye Distance:   ?Bilateral Distance:   ? ?Right Eye Near:   ?Left Eye Near:    ?Bilateral Near:    ? ?Physical Exam ?Vitals and nursing note reviewed.  ?Constitutional:   ?   General: She is not in acute distress. ?    Appearance: She is well-developed. She is obese. She is not toxic-appearing.  ?HENT:  ?   Head: Normocephalic and atraumatic.  ?   Left Ear: Tympanic membrane, ear canal and external ear normal.  ?   Mouth/Throat:  ?   Mouth

## 2021-09-26 NOTE — ED Notes (Signed)
Patient is being discharged from the Urgent Care and sent to the Emergency Department via POV . Per PA, patient is in need of higher level of care due to need of further evaluation. Patient is aware and verbalizes understanding of plan of care.  ?Vitals:  ? 09/26/21 1328  ?BP: (!) 143/91  ?Pulse: 89  ?Resp: 20  ?Temp: 98.2 ?F (36.8 ?C)  ?SpO2: 97%  ?  ?

## 2021-09-27 ENCOUNTER — Encounter (HOSPITAL_COMMUNITY): Payer: Self-pay | Admitting: *Deleted

## 2021-09-27 ENCOUNTER — Other Ambulatory Visit: Payer: Self-pay

## 2021-09-27 ENCOUNTER — Ambulatory Visit (HOSPITAL_COMMUNITY)
Admission: EM | Admit: 2021-09-27 | Discharge: 2021-09-27 | Disposition: A | Discharge status: Home / Self Care | Payer: Medicaid Other | Attending: Family Medicine | Admitting: Family Medicine

## 2021-09-27 DIAGNOSIS — E876 Hypokalemia: Secondary | ICD-10-CM | POA: Diagnosis not present

## 2021-09-27 DIAGNOSIS — M79602 Pain in left arm: Secondary | ICD-10-CM | POA: Diagnosis not present

## 2021-09-27 DIAGNOSIS — M25512 Pain in left shoulder: Secondary | ICD-10-CM | POA: Insufficient documentation

## 2021-09-27 LAB — POTASSIUM: Potassium: 3.4 mmol/L — ABNORMAL LOW (ref 3.5–5.1)

## 2021-09-27 NOTE — ED Triage Notes (Signed)
Pt was seen yesterday and sent to ED for CP ans arm pain. Pt reports she went to ED and had blood work drawn. Pt reports she waited with no information on Labs. Pt left ED after hrs of waiting. Pt presents today with CP,Lt arm pain and HA. Pt wants to know what the labs test showed. ?

## 2021-09-29 ENCOUNTER — Ambulatory Visit (HOSPITAL_COMMUNITY)
Admission: EM | Admit: 2021-09-29 | Discharge: 2021-09-29 | Disposition: A | Discharge status: Home / Self Care | Payer: Medicaid Other | Attending: Nurse Practitioner | Admitting: Nurse Practitioner

## 2021-09-29 DIAGNOSIS — E1169 Type 2 diabetes mellitus with other specified complication: Secondary | ICD-10-CM | POA: Insufficient documentation

## 2021-09-29 DIAGNOSIS — E1142 Type 2 diabetes mellitus with diabetic polyneuropathy: Secondary | ICD-10-CM | POA: Insufficient documentation

## 2021-09-29 DIAGNOSIS — E119 Type 2 diabetes mellitus without complications: Secondary | ICD-10-CM | POA: Diagnosis not present

## 2021-09-29 DIAGNOSIS — Z794 Long term (current) use of insulin: Secondary | ICD-10-CM | POA: Insufficient documentation

## 2021-09-29 DIAGNOSIS — E876 Hypokalemia: Secondary | ICD-10-CM | POA: Diagnosis not present

## 2021-09-29 LAB — POTASSIUM: Potassium: 3.4 mmol/L — ABNORMAL LOW (ref 3.5–5.1)

## 2021-09-29 MED ORDER — GLIPIZIDE 10 MG PO TABS: 10.0000 mg | ORAL_TABLET | Freq: Every day | ORAL | 1 refills | Status: DC

## 2021-09-29 MED ORDER — INSULIN DETEMIR 100 UNIT/ML FLEXPEN: 38.0000 [IU] | PEN_INJECTOR | Freq: Two times a day (BID) | SUBCUTANEOUS | 1 refills | Status: DC

## 2021-09-29 MED ORDER — PEN NEEDLES 32G X 4 MM MISC: 1.0000 | Freq: Two times a day (BID) | 1 refills | Status: DC

## 2021-09-29 MED ORDER — IBUPROFEN 800 MG PO TABS: 800.0000 mg | ORAL_TABLET | Freq: Three times a day (TID) | ORAL | 0 refills | Status: AC | PRN

## 2021-09-29 MED ORDER — SITAGLIPTIN PHOSPHATE 100 MG PO TABS: 100.0000 mg | ORAL_TABLET | Freq: Every day | ORAL | 1 refills | Status: DC

## 2021-09-29 NOTE — ED Provider Notes (Signed)
?[facility] ? ? ?[id number] ?09/27/21 Arrival Time: 4680 ? ?ASSESSMENT & PLAN: ? ?1. Left arm pain   ?2. Acute pain of left shoulder   ?3. Hypokalemia   ? ?Labs Reviewed  ?POTASSIUM - Abnormal; Notable for the following components:  ?    Result Value  ? Potassium 3.4 (*)   ? All other components within normal limits  ? ?She may repeat this with PCP follow up. ED labs reviewed. Negative Troponin x 2. CP reported yesterday has improved if not almost resolved. No resp difficulties. She is comfortable with home observation. ? ?Chest pain precautions discussed.Marland Kitchen ?Reviewed expectations re: course of current medical issues. Questions answered. ?Outlined signs and symptoms indicating need for more acute intervention. ?Patient verbalized understanding. ?After Visit Summary given. ? ? ?SUBJECTIVE: ? ?History from: patient. ?Jozee Hammer is a 46 y.o. female who was sent to ED yesterday for reported CP. Left ED without begin seen. Did have labs drawn and she is here for the results. No current CP reported. Does feel "a soreness" in her L shoulder. ? ?Social History  ? ?Tobacco Use  ?Smoking Status Every Day  ? Packs/day: 0.25  ? Types: Cigarettes  ?Smokeless Tobacco Never  ? ?Social History  ? ?Substance and Sexual Activity  ?Alcohol Use Yes  ? Comment: occasionally  ? ? ? ?OBJECTIVE: ? ?Vitals:  ? 09/27/21 1142  ?BP: 140/84  ?Pulse: 84  ?Resp: 20  ?Temp: 98 ?F (36.7 ?C)  ?SpO2: 98%  ?  ?General appearance: alert, oriented, no acute distress ?Eyes: PERRLA; EOMI; conjunctivae normal ?HENT: normocephalic; atraumatic ?Neck: supple with FROM ?Lungs: without labored respirations; speaks full sentences without difficulty; CTAB ?Heart: regular ?Chest Wall: without tenderness to palpation ?Abdomen: soft, non-tender; no guarding or rebound tenderness ?Extremities: without edema; without calf swelling or tenderness; symmetrical without gross deformities; no bony TTP of L shoulder ?Skin: warm and dry; without rash  or lesions ?Neuro: normal gait ?Psychological: alert and cooperative; normal mood and affect ? ?Labs: ?Results for orders placed or performed during the hospital encounter of 09/27/21  ?Potassium  ?Result Value Ref Range  ? Potassium 3.4 (L) 3.5 - 5.1 mmol/L  ? ?Labs Reviewed  ?POTASSIUM - Abnormal; Notable for the following components:  ?    Result Value  ? Potassium 3.4 (*)   ? All other components within normal limits  ? ? ?Imaging: ?Reviewed by me today... ?DG Chest 2 View ? ?Result Date: 09/26/2021 ?CLINICAL DATA:  Chest and left arm pain. EXAM: CHEST - 2 VIEW COMPARISON:  June 25, 2017 FINDINGS: The heart size and mediastinal contours are within normal limits. Perihilar predominant interstitial opacities may reflect reactive airways disease in the appropriate clinical setting. No focal airspace consolidation. No pleural effusion. No pneumothorax. Cervical fusion hardware. IMPRESSION: Perihilar predominant interstitial opacities may reflect reactive airways disease in the appropriate clinical setting. No focal airspace consolidation. Electronically Signed   By: Dahlia Bailiff M.D.   On: 09/26/2021 15:42   ? ? ?Allergies  ?Allergen Reactions  ? Metformin And Related Diarrhea  ? Penicillins Other (See Comments)  ?  Unknown; from childhood ?Has patient had a PCN reaction causing immediate rash, facial/tongue/throat swelling, SOB or lightheadedness with hypotension: Unknow ?Has patient had a PCN reaction causing severe rash involving mucus membranes or skin necrosis: Unknown ?Has patient had a PCN reaction that required hospitalization: Unknown ?Has patient had a PCN reaction occurring within the last 10 years: Unknown ?If all of the above answers are "NO",  then may proceed with Cephalosporin use. ?  ? Victoza [Liraglutide] Itching  ? ? ?Past Medical History:  ?Diagnosis Date  ? Anemia   ? Anxiety   ? Arthritis   ? chronic back pain   ? Asthma   ? rarely uses inhaler  ? Bipolar 1 disorder ([place])   ? Fibromyalgia    ? GERD (gastroesophageal reflux disease)   ? Headache   ? otc med  ? HLD (hyperlipidemia)   ? diet controlled  ? Hx of migraines   ? occ  ? Hypertension   ? Hypothyroidism   ? Obesity   ? Sleep apnea   ? does not use  cpap  ? Thrombocytosis 01/26/2020  ? Type 2 diabetes mellitus ([place])   ? Type 2  ? ?Social History  ? ?Socioeconomic History  ? Marital status: Married  ?  Spouse name: Not on file  ? Number of children: Not on file  ? Years of education: Not on file  ? Highest education level: Not on file  ?Occupational History  ? Not on file  ?Tobacco Use  ? Smoking status: Every Day  ?  Packs/day: 0.25  ?  Types: Cigarettes  ? Smokeless tobacco: Never  ?Vaping Use  ? Vaping Use: Never used  ?Substance and Sexual Activity  ? Alcohol use: Yes  ?  Comment: occasionally  ? Drug use: Yes  ?  Types: Marijuana  ?  Comment: Last use on 01/11/21  ? Sexual activity: Yes  ?  Birth control/protection: Surgical  ?  Comment: tubal ligation  ?Other Topics Concern  ? Not on file  ?Social History Narrative  ? Not on file  ? ?Social Determinants of Health  ? ?Financial Resource Strain: Not on file  ?Food Insecurity: Not on file  ?Transportation Needs: Not on file  ?Physical Activity: Not on file  ?Stress: Not on file  ?Social Connections: Not on file  ?Intimate Partner Violence: Not on file  ? ?Family History  ?Problem Relation Age of Onset  ? Thyroid disease Mother   ? Diabetes Daughter   ? Diabetes Son   ? Breast cancer Neg Hx   ? ?Past Surgical History:  ?Procedure Laterality Date  ? ANTERIOR CERVICAL DECOMP/DISCECTOMY FUSION N/A 01/16/2021  ? Procedure: Cervical five-six Anterior cervical decompression/discectomy/fusion;  Surgeon: Consuella Lose, MD;  Location: [facility];  Service: Neurosurgery;  Laterality: N/A;  ? CARPAL TUNNEL RELEASE Right   ? CESAREAN SECTION    ? x2  ? DILITATION & CURRETTAGE/HYSTROSCOPY WITH NOVASURE ABLATION N/A 12/07/2017  ? Procedure: DILATATION & CURETTAGE/HYSTEROSCOPY WITH ATTEMPED NOVASURE ABLATION,  FAILED/ENDOMETERIAL ABLATION WITH HTA SYSTEM;  Surgeon: Lavonia Drafts, MD;  [facility];  Service: Gynecology;  Laterality: N/A;  ? ELBOW SURGERY    ? "surgery for nerve pain"  ? GANGLION CYST EXCISION Right   ? right arm   ? RECTOVAGINAL FISTULA CLOSURE    ? after vag delivery  ? TONSILLECTOMY    ? childhood  ? TUBAL LIGATION    ? ? ?  ?Vanessa Kick, MD ?09/29/21 1122 ? ?

## 2021-09-29 NOTE — ED Provider Notes (Signed)
?[facility] ? ? ? ?CSN: [id number] ?Arrival date & time: 09/29/21  1123 ? ? ?  ? ?History   ?Chief Complaint ?Chief Complaint  ?Patient presents with  ? Follow-up  ? ? ?HPI ?Ruth [name] is a 46 y.o. female.  ? ?The patient is a 46 year old female who presents for follow-up.  She states that she was told by the last physician that saw her to follow-up to have her potassium rechecked.  Patient was seen in the ED for complaints of chest pain on 3/24.  She left before her labs were reviewed by the ER physician.  She presented on 3/25 for continued symptoms.  At her previous visit here Dr. Mannie Stabile reviewed her imaging and lab work.  Her potassium was 3.1 on 3/24.  The patient states that she no longer has radiating chest pain, shortness of breath or difficulty breathing.  She does endorse chest pain with deep breathing.  She also complains of a headache that has not improved numbness and tingling in the left hand.  The patient denies fever, chills, nausea, vomiting, or weakness.  She does have a history of diabetes and states she has not been on her medication for quite some time.  She does not have a primary care physician at this time.  Patient was advised that she will need a primary care physician to follow-up on her hypokalemia. ? ? ? ?Past Medical History:  ?Diagnosis Date  ? Anemia   ? Anxiety   ? Arthritis   ? chronic back pain   ? Asthma   ? rarely uses inhaler  ? Bipolar 1 disorder ([place])   ? Fibromyalgia   ? GERD (gastroesophageal reflux disease)   ? Headache   ? otc med  ? HLD (hyperlipidemia)   ? diet controlled  ? Hx of migraines   ? occ  ? Hypertension   ? Hypothyroidism   ? Obesity   ? Sleep apnea   ? does not use  cpap  ? Thrombocytosis 01/26/2020  ? Type 2 diabetes mellitus ([place])   ? Type 2  ? ? ?Patient Active Problem List  ? Diagnosis Date Noted  ? 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 BMI 45.0-49.9, adult ([place]) 09/04/2020  ? Right arm pain 02/14/2020  ?  Hyperlipidemia associated with type 2 diabetes mellitus ([place]) 09/19/2019  ? Abnormal uterine bleeding (AUB) 12/07/2017  ? Abnormal mammogram of both breasts 11/02/2016  ? Hypothyroidism 06/21/2009  ? Type 2 diabetes mellitus with peripheral neuropathy ([place]) 06/21/2009  ? BIPOLAR AFFECTIVE DISORDER 06/21/2009  ? PERSISTENT DISORDER INITIATING/MAINTAINING SLEEP 06/21/2009  ? INADEQUATE SLEEP HYGIENE 06/21/2009  ? OBSTRUCTIVE SLEEP APNEA 06/21/2009  ? 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  ? CARPAL TUNNEL SYNDROME 06/20/2009  ? MERALGIA PARESTHETICA 06/20/2009  ? Essential hypertension 06/20/2009  ? FIBROMYALGIA 06/20/2009  ? CEPHALGIA 06/20/2009  ? ? ?Past Surgical History:  ?Procedure Laterality Date  ? ANTERIOR CERVICAL DECOMP/DISCECTOMY FUSION N/A 01/16/2021  ? Procedure: Cervical five-six Anterior cervical decompression/discectomy/fusion;  Surgeon: Consuella Lose, MD;  Location: [facility];  Service: Neurosurgery;  Laterality: N/A;  ? CARPAL TUNNEL RELEASE Right   ? CESAREAN SECTION    ? x2  ? DILITATION & CURRETTAGE/HYSTROSCOPY WITH NOVASURE ABLATION N/A 12/07/2017  ? Procedure: DILATATION & CURETTAGE/HYSTEROSCOPY WITH ATTEMPED NOVASURE ABLATION, FAILED/ENDOMETERIAL ABLATION WITH HTA SYSTEM;  Surgeon: Lavonia Drafts, MD;  Location[facility];  Service: Gynecology;  Laterality: N/A;  ? ELBOW SURGERY    ? "surgery for nerve pain"  ?  GANGLION CYST EXCISION Right   ? right arm   ? RECTOVAGINAL FISTULA CLOSURE    ? after vag delivery  ? TONSILLECTOMY    ? childhood  ? TUBAL LIGATION    ? ? ?OB History   ? ? Gravida  ?7  ? Para  ?5  ? Term  ?4  ? Preterm  ?1  ? AB  ?2  ? Living  ?5  ?  ? ? SAB  ?2  ? IAB  ?   ? Ectopic  ?   ? Multiple  ?   ? Live Births  ?5  ?   ?  ? 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  ? ?  ? ? ? ?Home Medications   ? ?Prior to Admission medications   ?Medication Sig Start Date End Date Taking? Authorizing Provider  ?glipiZIDE  (GLUCOTROL) 10 MG tablet Take 1 tablet (10 mg total) by mouth daily before breakfast. 09/29/21 10/29/21 Yes Austynn Pridmore-Warren, Alda Lea, NP  ?ibuprofen (ADVIL) 800 MG tablet Take 1 tablet (800 mg total) by mouth every 8 (eight) hours as needed for up to 10 days for moderate pain. 09/29/21 10/09/21 Yes Venesha Petraitis-Warren, Alda Lea, NP  ?Accu-Chek FastClix Lancets MISC E1 1.65, FOUR BLOOD GLUCOSE TESTING FASTING ANND TWO HOURS AFTER SUPPER 07/01/18   [provider]  ?ACCU-CHEK GUIDE test strip CHECK BLOOD SUGARS TWICE, E11.65 12/21/18   [provider]  ?albuterol (ACCUNEB) 0.63 MG/3ML nebulizer solution Take 3 mLs (0.63 mg total) by nebulization every 4 (four) hours as needed for wheezing or shortness of breath. 05/06/21   Hazel Sams, PA-C  ?albuterol (VENTOLIN HFA) 108 (90 Base) MCG/ACT inhaler Inhale 1-2 puffs into the lungs every 6 (six) hours as needed for wheezing. 05/06/21   Hazel Sams, PA-C  ?amLODipine (NORVASC) 5 MG tablet Take 1 tablet (5 mg total) by mouth daily. 04/25/20   Nicolette Bang, MD  ?atorvastatin (LIPITOR) 80 MG tablet Take 1 tablet (80 mg total) by mouth daily at 6 PM. 04/25/20   Nicolette Bang, MD  ?cetirizine (ZYRTEC) 10 MG tablet Take 1 tablet (10 mg total) by mouth daily. 01/17/21   Consuella Lose, MD  ?cyclobenzaprine (FLEXERIL) 10 MG tablet Take 1 tablet (10 mg total) by mouth 2 (two) times daily as needed for muscle spasms. 12/22/20   Raspet, Derry Skill, PA-C  ?diclofenac (CATAFLAM) 50 MG tablet Take 50 mg by mouth 2 (two) times daily as needed for pain. 02/27/21   [provider]  ?diphenhydrAMINE (BENADRYL) 50 MG tablet Take 1 tablet (50 mg total) by mouth daily as needed for itching. 01/17/21   Consuella Lose, MD  ?fluconazole (DIFLUCAN) 150 MG tablet Take 1 tablet (150 mg total) by mouth daily. -For your yeast infection, start the Diflucan (fluconazole)- Take one pill today (day 1). If you're still having symptoms in 3 days, take the  second pill. 05/06/21   Hazel Sams, PA-C  ?fluticasone (FLONASE) 50 MCG/ACT nasal spray Place 1-2 sprays into both nostrils daily. 01/17/21   Consuella Lose, MD  ?insulin detemir (LEVEMIR) 100 UNIT/ML FlexPen Inject 38 Units into the skin 2 (two) times daily. 09/29/21 10/29/21  Tonika Eden-Warren, Alda Lea, NP  ?Insulin Pen Needle (PEN NEEDLES) 32G X 4 MM MISC 1 each by Does not apply route 2 (two) times daily. 09/29/21 10/29/21  Kataryna Mcquilkin-Warren, Alda Lea, NP  ?naproxen (NAPROSYN) 500 MG tablet Take 1  tablet (500 mg total) by mouth 2 (two) times daily. 12/22/20   Raspet, Derry Skill, PA-C  ?oxyCODONE (OXY IR/ROXICODONE) 5 MG immediate release tablet Take 5 mg by mouth every 8 (eight) hours as needed for moderate pain. 02/06/21   [provider]  ?promethazine-dextromethorphan (PROMETHAZINE-DM) 6.25-15 MG/5ML syrup Take 5 mLs by mouth 4 (four) times daily as needed for cough. 05/06/21   Hazel Sams, PA-C  ?sitaGLIPtin (JANUVIA) 100 MG tablet Take 1 tablet (100 mg total) by mouth daily. 09/29/21 10/29/21  Darik Massing-Warren, Alda Lea, NP  ?triamterene-hydrochlorothiazide (MAXZIDE-25) 37.5-25 MG tablet Take 1 tablet by mouth daily. 04/25/20   Nicolette Bang, MD  ? ? ?Family History ?Family History  ?Problem Relation Age of Onset  ? Thyroid disease Mother   ? Diabetes Daughter   ? Diabetes Son   ? Breast cancer Neg Hx   ? ? ?Social History ?Social History  ? ?Tobacco Use  ? Smoking status: Every Day  ?  Packs/day: 0.25  ?  Types: Cigarettes  ? Smokeless tobacco: Never  ?Vaping Use  ? Vaping Use: Never used  ?Substance Use Topics  ? Alcohol use: Yes  ?  Comment: occasionally  ? Drug use: Yes  ?  Types: Marijuana  ?  Comment: Last use on 01/11/21  ? ? ? ?Allergies   ?Metformin and related, Penicillins, and Victoza [liraglutide] ? ? ?Review of Systems ?Review of Systems  ?Constitutional: Negative.   ?Respiratory: Negative.    ?Cardiovascular:  Positive for chest pain (with deep breathing).  ?Gastrointestinal:  Negative.   ?Skin: Negative.   ?Neurological:  Positive for numbness (LUE) and headaches. Negative for dizziness, weakness and light-headedness.  ? ? ?Physical Exam ?Triage Vital Signs ?ED Triage Vitals  ?Enc Vitals

## 2021-09-29 NOTE — ED Triage Notes (Signed)
Pt reports that she was seen here 2 days ago and told to that she had mild heart attack due to her potassium level being low. Pt reports that she been potassium tablets and told to come back for blood work follow up  ?

## 2021-09-29 NOTE — Discharge Instructions (Addendum)
You will be contacted if your potassium result is abnormal. ?Take medication as prescribed. ?I have recommended PCP assistance to help you find a primary care physician. ?Crease fluids and get plenty of rest. ?Follow-up as needed. ? ?

## 2021-10-29 ENCOUNTER — Encounter (HOSPITAL_COMMUNITY): Payer: Self-pay

## 2021-10-29 ENCOUNTER — Emergency Department (HOSPITAL_COMMUNITY): Payer: Medicaid Other

## 2021-10-29 ENCOUNTER — Other Ambulatory Visit: Payer: Self-pay

## 2021-10-29 ENCOUNTER — Emergency Department (HOSPITAL_COMMUNITY)
Admission: EM | Admit: 2021-10-29 | Discharge: 2021-10-29 | Disposition: A | Discharge status: Home / Self Care | Payer: Medicaid Other | Attending: Emergency Medicine | Admitting: Emergency Medicine

## 2021-10-29 DIAGNOSIS — R059 Cough, unspecified: Secondary | ICD-10-CM | POA: Insufficient documentation

## 2021-10-29 DIAGNOSIS — R0789 Other chest pain: Secondary | ICD-10-CM | POA: Diagnosis not present

## 2021-10-29 DIAGNOSIS — R0602 Shortness of breath: Secondary | ICD-10-CM | POA: Diagnosis not present

## 2021-10-29 DIAGNOSIS — R072 Precordial pain: Secondary | ICD-10-CM | POA: Diagnosis not present

## 2021-10-29 DIAGNOSIS — I10 Essential (primary) hypertension: Secondary | ICD-10-CM | POA: Diagnosis not present

## 2021-10-29 DIAGNOSIS — R079 Chest pain, unspecified: Secondary | ICD-10-CM

## 2021-10-29 DIAGNOSIS — Z79899 Other long term (current) drug therapy: Secondary | ICD-10-CM | POA: Insufficient documentation

## 2021-10-29 DIAGNOSIS — N9489 Other specified conditions associated with female genital organs and menstrual cycle: Secondary | ICD-10-CM | POA: Diagnosis not present

## 2021-10-29 LAB — TROPONIN I (HIGH SENSITIVITY): Troponin I (High Sensitivity): 3 ng/L (ref <18)

## 2021-10-29 LAB — CBC
HCT: 35 % — ABNORMAL LOW (ref 36.0–46.0)
Hemoglobin: 10.3 g/dL — ABNORMAL LOW (ref 12.0–15.0)
MCH: 23.3 pg — ABNORMAL LOW (ref 26.0–34.0)
MCHC: 29.4 g/dL — ABNORMAL LOW (ref 30.0–36.0)
MCV: 79 fL — ABNORMAL LOW (ref 80.0–100.0)
Platelets: 520 10*3/uL — ABNORMAL HIGH (ref 150–400)
RBC: 4.43 MIL/uL (ref 3.87–5.11)
RDW: 16.2 % — ABNORMAL HIGH (ref 11.5–15.5)
WBC: 5.3 10*3/uL (ref 4.0–10.5)
nRBC: 0 % (ref 0.0–0.2)

## 2021-10-29 LAB — BASIC METABOLIC PANEL
Anion gap: 10 (ref 5–15)
BUN: 7 mg/dL (ref 6–20)
CO2: 23 mmol/L (ref 22–32)
Calcium: 9.2 mg/dL (ref 8.9–10.3)
Chloride: 104 mmol/L (ref 98–111)
Creatinine, Ser: 0.57 mg/dL (ref 0.44–1.00)
GFR, Estimated: >60 mL/min (ref >60)
Glucose, Bld: 176 mg/dL — ABNORMAL HIGH (ref 70–99)
Potassium: 3.7 mmol/L (ref 3.5–5.1)
Sodium: 137 mmol/L (ref 135–145)

## 2021-10-29 LAB — I-STAT BETA HCG BLOOD, ED (MC, WL, AP ONLY): I-stat hCG, quantitative: <5 m[IU]/mL (ref <5)

## 2021-10-29 MED ORDER — ACETAMINOPHEN 325 MG PO TABS
650.0000 mg | ORAL_TABLET | Freq: Once | ORAL | Status: AC
  Administered 2021-10-29: 650 mg via ORAL
  Filled 2021-10-29: qty 2

## 2021-10-29 NOTE — ED Provider Notes (Signed)
?[facility] ?Provider Note ? ? ?CSN: [id number] ?Arrival date & time: 10/29/21  1128 ? ?  ? ?History ? ?Chief Complaint  ?Patient presents with  ? Chest Pain  ? ? ?Ruth [name] is a 46 y.o. female. ? ? ?Chest Pain ?Pain location:  Substernal area ?Pain quality: aching   ?Pain radiates to:  Does not radiate ?Pain severity:  Mild ?Onset quality:  Gradual ?Duration:  1 day ?Timing:  Constant ?Progression:  Improving ?Chronicity:  New ?Context: at rest   ?Relieved by:  Nothing ?Worsened by:  Nothing ?Associated symptoms: cough   ?Associated symptoms: no abdominal pain, no anorexia, no anxiety, no back pain, no claudication, no diaphoresis, no dizziness, no dysphagia, no fatigue, no fever, no nausea, no near-syncope, no numbness, no orthopnea, no palpitations, no shortness of breath, no syncope, no vomiting and no weakness   ?Risk factors: high cholesterol and hypertension   ?Risk factors: no coronary artery disease and no prior DVT/PE   ? ?  ? ?Home Medications ?Prior to Admission medications   ?Medication Sig Start Date End Date Taking? Authorizing Provider  ?albuterol (VENTOLIN HFA) 108 (90 Base) MCG/ACT inhaler Inhale 1-2 puffs into the lungs every 6 (six) hours as needed for wheezing. 05/06/21  Yes Hazel Sams, PA-C  ?atorvastatin (LIPITOR) 80 MG tablet Take 1 tablet (80 mg total) by mouth daily at 6 PM. 04/25/20  Yes Nicolette Bang, MD  ?cetirizine (ZYRTEC) 10 MG tablet Take 1 tablet (10 mg total) by mouth daily. 01/17/21  Yes Consuella Lose, MD  ?diclofenac (CATAFLAM) 50 MG tablet Take 50 mg by mouth 2 (two) times daily as needed for pain. 02/27/21  Yes [provider]  ?diphenhydrAMINE (BENADRYL) 50 MG tablet Take 1 tablet (50 mg total) by mouth daily as needed for itching. 01/17/21  Yes Consuella Lose, MD  ?fluticasone (FLONASE) 50 MCG/ACT nasal spray Place 1-2 sprays into both nostrils daily. 01/17/21  Yes Consuella Lose, MD   ?glipiZIDE (GLUCOTROL) 10 MG tablet Take 1 tablet (10 mg total) by mouth daily before breakfast. ?Patient taking differently: Take 10 mg by mouth 2 (two) times daily as needed (high sugar). 09/29/21 10/29/21 Yes Leath-Warren, Alda Lea, NP  ?ibuprofen (ADVIL) 800 MG tablet Take 800 mg by mouth daily as needed for cramping or moderate pain.   Yes [provider]  ?insulin detemir (LEVEMIR) 100 UNIT/ML FlexPen Inject 38 Units into the skin 2 (two) times daily. ?Patient taking differently: Inject 43 Units into the skin 2 (two) times daily. 09/29/21 10/29/21 Yes Leath-Warren, Alda Lea, NP  ?naproxen sodium (ALEVE) 220 MG tablet Take 220 mg by mouth 2 (two) times daily as needed (cramping).   Yes [provider]  ?albuterol (ACCUNEB) 0.63 MG/3ML nebulizer solution Take 3 mLs (0.63 mg total) by nebulization every 4 (four) hours as needed for wheezing or shortness of breath. ?Patient not taking: Reported on 10/29/2021 05/06/21   Hazel Sams, PA-C  ?amLODipine (NORVASC) 5 MG tablet Take 1 tablet (5 mg total) by mouth daily. ?Patient not taking: Reported on 10/29/2021 04/25/20   Nicolette Bang, MD  ?cyclobenzaprine (FLEXERIL) 10 MG tablet Take 1 tablet (10 mg total) by mouth 2 (two) times daily as needed for muscle spasms. ?Patient not taking: Reported on 10/29/2021 12/22/20   Raspet, Derry Skill, PA-C  ?fluconazole (DIFLUCAN) 150 MG tablet Take 1 tablet (150 mg total) by mouth daily. -For your yeast infection, start the Diflucan (fluconazole)- Take one pill today (day  1). If you're still having symptoms in 3 days, take the second pill. ?Patient not taking: Reported on 10/29/2021 05/06/21   Hazel Sams, PA-C  ?Insulin Pen Needle (PEN NEEDLES) 32G X 4 MM MISC 1 each by Does not apply route 2 (two) times daily. 09/29/21 10/29/21  Leath-Warren, Alda Lea, NP  ?naproxen (NAPROSYN) 500 MG tablet Take 1 tablet (500 mg total) by mouth 2 (two) times daily. ?Patient not taking: Reported on 10/29/2021 12/22/20    Raspet, Derry Skill, PA-C  ?oxyCODONE (OXY IR/ROXICODONE) 5 MG immediate release tablet Take 5 mg by mouth every 8 (eight) hours as needed for moderate pain. ?Patient not taking: Reported on 10/29/2021 02/06/21   [provider]  ?promethazine-dextromethorphan (PROMETHAZINE-DM) 6.25-15 MG/5ML syrup Take 5 mLs by mouth 4 (four) times daily as needed for cough. ?Patient not taking: Reported on 10/29/2021 05/06/21   Hazel Sams, PA-C  ?sitaGLIPtin (JANUVIA) 100 MG tablet Take 1 tablet (100 mg total) by mouth daily. ?Patient not taking: Reported on 10/29/2021 09/29/21 10/29/21  Leath-Warren, Alda Lea, NP  ?triamterene-hydrochlorothiazide (MAXZIDE-25) 37.5-25 MG tablet Take 1 tablet by mouth daily. ?Patient not taking: Reported on 10/29/2021 04/25/20   Nicolette Bang, MD  ?   ? ?Allergies    ?Metformin and related, Penicillins, and Victoza [liraglutide]   ? ?Review of Systems   ?Review of Systems  ?Constitutional:  Negative for diaphoresis, fatigue and fever.  ?HENT:  Negative for trouble swallowing.   ?Respiratory:  Positive for cough. Negative for shortness of breath.   ?Cardiovascular:  Positive for chest pain. Negative for palpitations, orthopnea, claudication, syncope and near-syncope.  ?Gastrointestinal:  Negative for abdominal pain, anorexia, nausea and vomiting.  ?Musculoskeletal:  Negative for back pain.  ?Neurological:  Negative for dizziness, weakness and numbness.  ? ?Physical Exam ?Updated Vital Signs ?BP 116/70   Pulse 77   Temp 97.9 ?F (36.6 ?C) (Oral)   Resp 18   Ht '5\' 7"'$  (1.702 m)   Wt 135.2 kg   SpO2 100%   BMI 46.67 kg/m?  ?Physical Exam ?Vitals and nursing note reviewed.  ?Constitutional:   ?   General: She is not in acute distress. ?   Appearance: She is well-developed. She is not ill-appearing.  ?HENT:  ?   Head: Normocephalic and atraumatic.  ?Eyes:  ?   Extraocular Movements: Extraocular movements intact.  ?   Conjunctiva/sclera: Conjunctivae normal.  ?   Pupils: Pupils are  equal, round, and reactive to light.  ?Cardiovascular:  ?   Rate and Rhythm: Normal rate and regular rhythm.  ?   Pulses:     ?     Radial pulses are 2+ on the right side and 2+ on the left side.  ?   Heart sounds: No murmur heard. ?Pulmonary:  ?   Effort: Pulmonary effort is normal. No respiratory distress.  ?   Breath sounds: Normal breath sounds. No decreased breath sounds, wheezing or rhonchi.  ?Chest:  ?   Chest wall: Tenderness (TTP to chest wall) present.  ?Abdominal:  ?   Palpations: Abdomen is soft.  ?   Tenderness: There is no abdominal tenderness.  ?Musculoskeletal:     ?   General: No swelling.  ?   Cervical back: Normal range of motion and neck supple.  ?Skin: ?   General: Skin is warm and dry.  ?   Capillary Refill: Capillary refill takes less than 2 seconds.  ?Neurological:  ?   Mental Status: She is alert.  ?  Psychiatric:     ?   Mood and Affect: Mood normal.  ? ? ?ED Results / Procedures / Treatments   ?Labs ?(all labs ordered are listed, but only abnormal results are displayed) ?Labs Reviewed  ?BASIC METABOLIC PANEL - Abnormal; Notable for the following components:  ?    Result Value  ? Glucose, Bld 176 (*)   ? All other components within normal limits  ?CBC - Abnormal; Notable for the following components:  ? Hemoglobin 10.3 (*)   ? HCT 35.0 (*)   ? MCV 79.0 (*)   ? MCH 23.3 (*)   ? MCHC 29.4 (*)   ? RDW 16.2 (*)   ? Platelets 520 (*)   ? All other components within normal limits  ?I-STAT BETA HCG BLOOD, ED ([facility], [facility], [facility] ONLY)  ?TROPONIN I (HIGH SENSITIVITY)  ? ? ?EKG ?EKG Interpretation ? ?Date/Time:  Wednesday October 29 2021 11:44:40 EDT ?Ventricular Rate:  93 ?PR Interval:  152 ?QRS Duration: 76 ?QT Interval:  380 ?QTC Calculation: 472 ?R Axis:   -62 ?Text Interpretation: Normal sinus rhythm Left axis deviation Abnormal ECG When compared with ECG of 27-Sep-2021 11:59, PREVIOUS ECG IS PRESENT Confirmed by Lennice Sites [phone]) on 10/29/2021 12:37:07 PM ? ?Radiology ?DG Chest 2 View ? ?Result Date:  10/29/2021 ?CLINICAL DATA:  Chest pain and shortness of breath. EXAM: CHEST - 2 VIEW COMPARISON:  Chest two views 09/26/2021 FINDINGS: Cardiac silhouette and mediastinal contours are within normal limits. Mildly decrease

## 2021-10-29 NOTE — Discharge Instructions (Signed)
Recommend Tylenol and ibuprofen for further pain management.  Overall your lab work today was normal.  There does not appear to be any infection in your lungs or any issues with your heart.  Get plenty of rest and hydration.  Follow-up with primary care doctor. ?

## 2021-10-29 NOTE — ED Triage Notes (Signed)
Patient complains of chest pain and sob.  Reports came but didn't feel like waiting so she left.   ?

## 2021-10-30 ENCOUNTER — Telehealth: Payer: Self-pay

## 2021-10-30 NOTE — Telephone Encounter (Signed)
Transition Care Management Follow-up Telephone Call ?Date of discharge and from where: 10/29/2021-[facility]  ?How have you been since you were released from the hospital? Pt stated she is doing fine.  ?Any questions or concerns? No ? ?Items Reviewed: ?Did the pt receive and understand the discharge instructions provided? Yes  ?Medications obtained and verified? Yes  ?Other? No  ?Any new allergies since your discharge? No  ?Dietary orders reviewed? No ?Do you have support at home? Yes  ? ?Home Care and Equipment/Supplies: ?Were home health services ordered? not applicable ?If so, what is the name of the agency? N/A  ?Has the agency set up a time to come to the patient's home? not applicable ?Were any new equipment or medical supplies ordered?  No ?What is the name of the medical supply agency? N/A ?Were you able to get the supplies/equipment? not applicable ?Do you have any questions related to the use of the equipment or supplies? No ? ?Functional Questionnaire: (I = Independent and D = Dependent) ?ADLs: I ? ?Bathing/Dressing- I ? ?Meal Prep- I ? ?Eating- I ? ?Maintaining continence- I ? ?Transferring/Ambulation- I ? ?Managing Meds- I ? ?Follow up appointments reviewed: ? ?PCP Hospital f/u appt confirmed? No   ?Specialist Hospital f/u appt confirmed? No   ?Are transportation arrangements needed? No  ?If their condition worsens, is the pt aware to call PCP or go to the Emergency Dept.? Yes ?Was the patient provided with contact information for the PCP's office or ED? Yes ?Was to pt encouraged to call back with questions or concerns? Yes  ?

## 2021-11-22 ENCOUNTER — Emergency Department (HOSPITAL_COMMUNITY)
Admission: EM | Admit: 2021-11-22 | Discharge: 2021-11-22 | Disposition: A | Discharge status: Home / Self Care | Payer: Medicaid Other | Attending: Emergency Medicine | Admitting: Emergency Medicine

## 2021-11-22 ENCOUNTER — Other Ambulatory Visit: Payer: Self-pay

## 2021-11-22 ENCOUNTER — Ambulatory Visit (HOSPITAL_COMMUNITY)
Admission: EM | Admit: 2021-11-22 | Discharge: 2021-11-22 | Disposition: A | Discharge status: Home / Self Care | Payer: Medicaid Other | Attending: Physician Assistant | Admitting: Physician Assistant

## 2021-11-22 ENCOUNTER — Encounter (HOSPITAL_COMMUNITY): Payer: Self-pay | Admitting: *Deleted

## 2021-11-22 ENCOUNTER — Emergency Department (HOSPITAL_COMMUNITY): Payer: Medicaid Other

## 2021-11-22 DIAGNOSIS — R110 Nausea: Secondary | ICD-10-CM | POA: Diagnosis not present

## 2021-11-22 DIAGNOSIS — J45909 Unspecified asthma, uncomplicated: Secondary | ICD-10-CM | POA: Insufficient documentation

## 2021-11-22 DIAGNOSIS — Z7984 Long term (current) use of oral hypoglycemic drugs: Secondary | ICD-10-CM | POA: Insufficient documentation

## 2021-11-22 DIAGNOSIS — Z794 Long term (current) use of insulin: Secondary | ICD-10-CM | POA: Insufficient documentation

## 2021-11-22 DIAGNOSIS — Z79899 Other long term (current) drug therapy: Secondary | ICD-10-CM | POA: Insufficient documentation

## 2021-11-22 DIAGNOSIS — N9489 Other specified conditions associated with female genital organs and menstrual cycle: Secondary | ICD-10-CM | POA: Diagnosis not present

## 2021-11-22 DIAGNOSIS — R1031 Right lower quadrant pain: Secondary | ICD-10-CM

## 2021-11-22 DIAGNOSIS — E119 Type 2 diabetes mellitus without complications: Secondary | ICD-10-CM | POA: Insufficient documentation

## 2021-11-22 DIAGNOSIS — I10 Essential (primary) hypertension: Secondary | ICD-10-CM | POA: Insufficient documentation

## 2021-11-22 LAB — POCT URINALYSIS DIPSTICK, ED / UC
Bilirubin Urine: NEGATIVE
Glucose, UA: NEGATIVE mg/dL
Hgb urine dipstick: NEGATIVE
Ketones, ur: NEGATIVE mg/dL
Leukocytes,Ua: NEGATIVE
Nitrite: NEGATIVE
Protein, ur: NEGATIVE mg/dL
Specific Gravity, Urine: 1.025 (ref 1.005–1.030)
Urobilinogen, UA: 0.2 mg/dL (ref 0.0–1.0)
pH: 5.5 (ref 5.0–8.0)

## 2021-11-22 LAB — I-STAT BETA HCG BLOOD, ED (MC, WL, AP ONLY): I-stat hCG, quantitative: <5 m[IU]/mL (ref <5)

## 2021-11-22 LAB — I-STAT CHEM 8, ED
BUN: 9 mg/dL (ref 6–20)
Calcium, Ion: 1.05 mmol/L — ABNORMAL LOW (ref 1.15–1.40)
Chloride: 104 mmol/L (ref 98–111)
Creatinine, Ser: 0.5 mg/dL (ref 0.44–1.00)
Glucose, Bld: 165 mg/dL — ABNORMAL HIGH (ref 70–99)
HCT: 31 % — ABNORMAL LOW (ref 36.0–46.0)
Hemoglobin: 10.5 g/dL — ABNORMAL LOW (ref 12.0–15.0)
Potassium: 3.9 mmol/L (ref 3.5–5.1)
Sodium: 137 mmol/L (ref 135–145)
TCO2: 27 mmol/L (ref 22–32)

## 2021-11-22 LAB — COMPREHENSIVE METABOLIC PANEL
ALT: 9 U/L (ref 0–44)
AST: 10 U/L — ABNORMAL LOW (ref 15–41)
Albumin: 3.6 g/dL (ref 3.5–5.0)
Alkaline Phosphatase: 77 U/L (ref 38–126)
Anion gap: 9 (ref 5–15)
BUN: 9 mg/dL (ref 6–20)
CO2: 24 mmol/L (ref 22–32)
Calcium: 9 mg/dL (ref 8.9–10.3)
Chloride: 103 mmol/L (ref 98–111)
Creatinine, Ser: 0.66 mg/dL (ref 0.44–1.00)
GFR, Estimated: >60 mL/min (ref >60)
Glucose, Bld: 162 mg/dL — ABNORMAL HIGH (ref 70–99)
Potassium: 3.7 mmol/L (ref 3.5–5.1)
Sodium: 136 mmol/L (ref 135–145)
Total Bilirubin: 0.5 mg/dL (ref 0.3–1.2)
Total Protein: 7 g/dL (ref 6.5–8.1)

## 2021-11-22 LAB — CBC WITH DIFFERENTIAL/PLATELET
Abs Immature Granulocytes: 0.01 10*3/uL (ref 0.00–0.07)
Basophils Absolute: 0 10*3/uL (ref 0.0–0.1)
Basophils Relative: 0 %
Eosinophils Absolute: 0.1 10*3/uL (ref 0.0–0.5)
Eosinophils Relative: 2 %
HCT: 31.5 % — ABNORMAL LOW (ref 36.0–46.0)
Hemoglobin: 9.6 g/dL — ABNORMAL LOW (ref 12.0–15.0)
Immature Granulocytes: 0 %
Lymphocytes Relative: 38 %
Lymphs Abs: 1.8 10*3/uL (ref 0.7–4.0)
MCH: 23.5 pg — ABNORMAL LOW (ref 26.0–34.0)
MCHC: 30.5 g/dL (ref 30.0–36.0)
MCV: 77 fL — ABNORMAL LOW (ref 80.0–100.0)
Monocytes Absolute: 0.2 10*3/uL (ref 0.1–1.0)
Monocytes Relative: 4 %
Neutro Abs: 2.6 10*3/uL (ref 1.7–7.7)
Neutrophils Relative %: 56 %
Platelets: 450 10*3/uL — ABNORMAL HIGH (ref 150–400)
RBC: 4.09 MIL/uL (ref 3.87–5.11)
RDW: 16.3 % — ABNORMAL HIGH (ref 11.5–15.5)
WBC: 4.6 10*3/uL (ref 4.0–10.5)
nRBC: 0 % (ref 0.0–0.2)

## 2021-11-22 LAB — URINALYSIS, ROUTINE W REFLEX MICROSCOPIC
Bilirubin Urine: NEGATIVE
Glucose, UA: NEGATIVE mg/dL
Hgb urine dipstick: NEGATIVE
Ketones, ur: NEGATIVE mg/dL
Leukocytes,Ua: NEGATIVE
Nitrite: NEGATIVE
Protein, ur: NEGATIVE mg/dL
Specific Gravity, Urine: 1.004 — ABNORMAL LOW (ref 1.005–1.030)
pH: 6 (ref 5.0–8.0)

## 2021-11-22 LAB — POC URINE PREG, ED: Preg Test, Ur: NEGATIVE

## 2021-11-22 LAB — LIPASE, BLOOD: Lipase: 24 U/L (ref 11–51)

## 2021-11-22 MED ORDER — ONDANSETRON HCL 4 MG/2ML IJ SOLN
4.0000 mg | Freq: Once | INTRAMUSCULAR | Status: AC
  Administered 2021-11-22: 4 mg via INTRAVENOUS
  Filled 2021-11-22: qty 2

## 2021-11-22 MED ORDER — MORPHINE SULFATE (PF) 4 MG/ML IV SOLN
4.0000 mg | Freq: Once | INTRAVENOUS | Status: AC
  Administered 2021-11-22: 4 mg via INTRAVENOUS
  Filled 2021-11-22: qty 1

## 2021-11-22 MED ORDER — IOHEXOL 300 MG/ML  SOLN
100.0000 mL | Freq: Once | INTRAMUSCULAR | Status: AC | PRN
  Administered 2021-11-22: 100 mL via INTRAVENOUS

## 2021-11-22 MED ORDER — SODIUM CHLORIDE 0.9 % IV BOLUS
1000.0000 mL | Freq: Once | INTRAVENOUS | Status: AC
  Administered 2021-11-22: 1000 mL via INTRAVENOUS

## 2021-11-22 MED ORDER — ONDANSETRON HCL 4 MG PO TABS: 4.0000 mg | ORAL_TABLET | Freq: Four times a day (QID) | ORAL | 0 refills | Status: DC

## 2021-11-22 NOTE — ED Triage Notes (Signed)
Pt reports 1 day of back and abdominal pain that started this morning. No known injury or falls.

## 2021-11-22 NOTE — ED Provider Notes (Signed)
[facility]    CSN: [id number] Arrival date & time: 11/22/21  1000      History   Chief Complaint Chief Complaint  Patient presents with   Back Pain   Abdominal Cramping    HPI Ruth [name] is a 46 y.o. female.   Pt complains of right lower abdominal pain that radiates to her lower back that started earlier today.  She reports associated nausea.  Denies fever, chills, vomiting.  Reports normal bowel movement this morning.  She has taken nothing for her symptoms.  No injury or trauma.   She also reports she had a boil to the right groin over the last week or so.  She reports it began draining a few days ago.  She reports it has gone down in size.   Elevated BP here today.  Report she has not taken her blood pressure medications.  Denies headache, chest pain, lower extremity edema.    BP Readings from Last 3 Encounters: 11/22/21 : (!) 155/103 10/29/21 : (!) 141/99 09/29/21 : 138/83     Past Medical History:  Diagnosis Date   Anemia    Anxiety    Arthritis    chronic back pain    Asthma    rarely uses inhaler   Bipolar 1 disorder (HCC)    Fibromyalgia    GERD (gastroesophageal reflux disease)    Headache    otc med   HLD (hyperlipidemia)    diet controlled   Hx of migraines    occ   Hypertension    Hypothyroidism    Obesity    Sleep apnea    does not use  cpap   Thrombocytosis 01/26/2020   Type 2 diabetes mellitus ([place])    Type 2    Patient Active Problem List   Diagnosis Date Noted   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   BMI 45.0-49.9, adult ([place]) 09/04/2020   Right arm pain 02/14/2020   Hyperlipidemia associated with type 2 diabetes mellitus ([place]) 09/19/2019   Abnormal uterine bleeding (AUB) 12/07/2017   Abnormal mammogram of both breasts 11/02/2016   Hypothyroidism 06/21/2009   Type 2 diabetes mellitus with peripheral neuropathy ([place]) 06/21/2009   BIPOLAR AFFECTIVE DISORDER 06/21/2009   PERSISTENT  DISORDER INITIATING/MAINTAINING SLEEP 06/21/2009   INADEQUATE SLEEP HYGIENE 06/21/2009   OBSTRUCTIVE SLEEP APNEA 06/21/2009   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   CARPAL TUNNEL SYNDROME 06/20/2009   MERALGIA PARESTHETICA 06/20/2009   Essential hypertension 06/20/2009   FIBROMYALGIA 06/20/2009   CEPHALGIA 06/20/2009    Past Surgical History:  Procedure Laterality Date   ANTERIOR CERVICAL DECOMP/DISCECTOMY FUSION N/A 01/16/2021   Procedure: Cervical five-six Anterior cervical decompression/discectomy/fusion;  Surgeon: Consuella Lose, MD;  Location: [facility];  Service: Neurosurgery;  Laterality: N/A;   CARPAL TUNNEL RELEASE Right    CESAREAN SECTION     x2   DILITATION & CURRETTAGE/HYSTROSCOPY WITH NOVASURE ABLATION N/A 12/07/2017   Procedure: DILATATION & CURETTAGE/HYSTEROSCOPY WITH ATTEMPED NOVASURE ABLATION, FAILED/ENDOMETERIAL ABLATION WITH HTA SYSTEM;  Surgeon: Lavonia Drafts, MD;  Location: [facility];  Service: Gynecology;  Laterality: N/A;   ELBOW SURGERY     "surgery for nerve pain"   GANGLION CYST EXCISION Right    right arm    RECTOVAGINAL FISTULA CLOSURE     after vag delivery   TONSILLECTOMY     childhood   TUBAL LIGATION      OB History     Gravida  7   Para  5  Term  4   Preterm  1   AB  2   Living  5      SAB  2   IAB      Ectopic      Multiple      Live Births  5        Obstetric Comments  Vag x 2>c/s emergency>vbac>c/s and BTL 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 Authorizing Provider  albuterol (ACCUNEB) 0.63 MG/3ML nebulizer solution Take 3 mLs (0.63 mg total) by nebulization every 4 (four) hours as needed for wheezing or shortness of breath. Patient not taking: Reported on 10/29/2021 05/06/21   Hazel Sams, PA-C  albuterol (VENTOLIN HFA) 108 (90 Base) MCG/ACT inhaler Inhale 1-2 puffs into the lungs every 6  (six) hours as needed for wheezing. 05/06/21   Hazel Sams, PA-C  amLODipine (NORVASC) 5 MG tablet Take 1 tablet (5 mg total) by mouth daily. Patient not taking: Reported on 10/29/2021 04/25/20   Nicolette Bang, MD  atorvastatin (LIPITOR) 80 MG tablet Take 1 tablet (80 mg total) by mouth daily at 6 PM. 04/25/20   Nicolette Bang, MD  cetirizine (ZYRTEC) 10 MG tablet Take 1 tablet (10 mg total) by mouth daily. 01/17/21   Consuella Lose, MD  cyclobenzaprine (FLEXERIL) 10 MG tablet Take 1 tablet (10 mg total) by mouth 2 (two) times daily as needed for muscle spasms. Patient not taking: Reported on 10/29/2021 12/22/20   Raspet, Derry Skill, PA-C  diclofenac (CATAFLAM) 50 MG tablet Take 50 mg by mouth 2 (two) times daily as needed for pain. 02/27/21   [provider]  diphenhydrAMINE (BENADRYL) 50 MG tablet Take 1 tablet (50 mg total) by mouth daily as needed for itching. 01/17/21   Consuella Lose, MD  fluconazole (DIFLUCAN) 150 MG tablet Take 1 tablet (150 mg total) by mouth daily. -For your yeast infection, start the Diflucan (fluconazole)- Take one pill today (day 1). If you're still having symptoms in 3 days, take the second pill. Patient not taking: Reported on 10/29/2021 05/06/21   Hazel Sams, PA-C  fluticasone [facility]) 50 MCG/ACT nasal spray Place 1-2 sprays into both nostrils daily. 01/17/21   Consuella Lose, MD  glipiZIDE (GLUCOTROL) 10 MG tablet Take 1 tablet (10 mg total) by mouth daily before breakfast. Patient taking differently: Take 10 mg by mouth 2 (two) times daily as needed (high sugar). 09/29/21 10/29/21  Leath-Warren, Alda Lea, NP  ibuprofen (ADVIL) 800 MG tablet Take 800 mg by mouth daily as needed for cramping or moderate pain.    [provider]  insulin detemir (LEVEMIR) 100 UNIT/ML FlexPen Inject 38 Units into the skin 2 (two) times daily. Patient taking differently: Inject 43 Units into the skin 2 (two) times daily. 09/29/21 10/29/21   Leath-Warren, Alda Lea, NP  Insulin Pen Needle (PEN NEEDLES) 32G X 4 MM MISC 1 each by Does not apply route 2 (two) times daily. 09/29/21 10/29/21  Leath-Warren, Alda Lea, NP  naproxen (NAPROSYN) 500 MG tablet Take 1 tablet (500 mg total) by mouth 2 (two) times daily. Patient not taking: Reported on 10/29/2021 12/22/20   Raspet, Junie Panning K, PA-C  naproxen sodium (ALEVE) 220 MG tablet Take 220 mg by mouth 2 (two) times daily as needed (cramping).    [provider]  oxyCODONE (OXY  IR/ROXICODONE) 5 MG immediate release tablet Take 5 mg by mouth every 8 (eight) hours as needed for moderate pain. Patient not taking: Reported on 10/29/2021 02/06/21   [provider]  promethazine-dextromethorphan (PROMETHAZINE-DM) 6.25-15 MG/5ML syrup Take 5 mLs by mouth 4 (four) times daily as needed for cough. Patient not taking: Reported on 10/29/2021 05/06/21   Hazel Sams, PA-C  sitaGLIPtin (JANUVIA) 100 MG tablet Take 1 tablet (100 mg total) by mouth daily. Patient not taking: Reported on 10/29/2021 09/29/21 10/29/21  Leath-Warren, Alda Lea, NP  triamterene-hydrochlorothiazide (MAXZIDE-25) 37.5-25 MG tablet Take 1 tablet by mouth daily. Patient not taking: Reported on 10/29/2021 04/25/20   Nicolette Bang, MD    Family History Family History  Problem Relation Age of Onset   Thyroid disease Mother    Diabetes Daughter    Diabetes Son    Breast cancer Neg Hx     Social History Social History   Tobacco Use   Smoking status: Every Day    Packs/day: 0.25    Types: Cigarettes   Smokeless tobacco: Never  Vaping Use   Vaping Use: Never used  Substance Use Topics   Alcohol use: Yes    Comment: occasionally   Drug use: Yes    Types: Marijuana    Comment: \     Allergies   Metformin and related, Penicillins, and Victoza [liraglutide]   Review of Systems Review of Systems  Constitutional:  Negative for chills and fever.  HENT:  Negative for ear pain and sore throat.    Eyes:  Negative for pain and visual disturbance.  Respiratory:  Negative for cough and shortness of breath.   Cardiovascular:  Negative for chest pain and palpitations.  Gastrointestinal:  Positive for abdominal pain and nausea. Negative for vomiting.  Genitourinary:  Negative for dysuria and hematuria.  Musculoskeletal:  Negative for arthralgias and back pain.  Skin:  Negative for color change and rash.  Neurological:  Negative for seizures and syncope.  All other systems reviewed and are negative.   Physical Exam Triage Vital Signs ED Triage Vitals [11/22/21 1020]  Enc Vitals Group     BP (!) 155/103     Pulse Rate 85     Resp 16     Temp 97.7 F (36.5 C)     Temp Source Oral     SpO2 97 %     Weight      Height      Head Circumference      Peak Flow      Pain Score 10     Pain Loc      Pain Edu?      Excl. in [place]?    No data found.  Updated Vital Signs BP (!) 155/103 (BP Location: Left Arm)   Pulse 85   Temp 97.7 F (36.5 C) (Oral)   Resp 16   SpO2 97%   Visual Acuity Right Eye Distance:   Left Eye Distance:   Bilateral Distance:    Right Eye Near:   Left Eye Near:    Bilateral Near:     Physical Exam Vitals and nursing note reviewed.  Constitutional:      General: She is not in acute distress.    Appearance: She is well-developed.  HENT:     Head: Normocephalic and atraumatic.  Eyes:     Conjunctiva/sclera: Conjunctivae normal.  Cardiovascular:     Rate and Rhythm: Normal rate and regular rhythm.     Heart  sounds: No murmur heard. Pulmonary:     Effort: Pulmonary effort is normal. No respiratory distress.     Breath sounds: Normal breath sounds.  Abdominal:     Palpations: Abdomen is soft.     Tenderness: There is abdominal tenderness in the right lower quadrant. There is no right CVA tenderness or left CVA tenderness.     Comments: Right groin/pubic area with an area of induration, there is an opening where it has been draining.  No fluctuance  noted.  No swelling or redness noted.   Musculoskeletal:        General: No swelling.     Cervical back: Neck supple.  Skin:    General: Skin is warm and dry.     Capillary Refill: Capillary refill takes less than 2 seconds.  Neurological:     Mental Status: She is alert.  Psychiatric:        Mood and Affect: Mood normal.     UC Treatments / Results  Labs (all labs ordered are listed, but only abnormal results are displayed) Labs Reviewed  POCT URINALYSIS DIPSTICK, ED / UC  POC URINE PREG, ED    EKG   Radiology No results found.  Procedures Procedures (including critical care time)  Medications Ordered in UC Medications - No data to display  Initial Impression / Assessment and Plan / UC Course  I have reviewed the triage vital signs and the nursing notes.  Pertinent labs & imaging results that were available during my care of the patient were reviewed by me and considered in my medical decision making (see chart for details).     RLQ pain which radiation to lower back with associated nausea.  TTP on exam.  Advised further evaluation in the ED to rule out appendicitis.  UA normal, she reports normal bowel movement this morning.  She has a healing boil that has been draining, this is in her groin, not her abdomen, do not feel this is associated with lower abdominal pain.  Pt agrees with plan.  Final Clinical Impressions(s) / UC Diagnoses   Final diagnoses:  RLQ abdominal pain     Discharge Instructions      Recommend further evaluation the ED for your abdominal pain.       ED Prescriptions   None    PDMP not reviewed this encounter.   Ward, Lenise Arena, PA-C 11/22/21 1040

## 2021-11-22 NOTE — Discharge Instructions (Signed)
Return for any problem.  ?

## 2021-11-22 NOTE — ED Triage Notes (Signed)
PT sent here from UC for acute onset RLQ pain since this am.  Denies changes in bowel and bladder habits.

## 2021-11-22 NOTE — ED Provider Notes (Signed)
[facility] EMERGENCY DEPARTMENT Provider Note   CSN: [id number] Arrival date & time: 11/22/21  1057     History  Chief Complaint  Patient presents with   Abdominal Pain    Ruth [name] is a 46 y.o. female.  46 year old female with prior medical history as detailed below presents for evaluation.  Patient complains of right lower quadrant abdominal pain.  Patient's pain began this morning.  Patient reports that she felt fine last night when she went to bed.  She woke this morning and immediately noticed right lower quadrant abdominal pain.  Patient's pain is constant.  It radiates to her right low back.  She reports associated nausea.  She denies vomiting or fever.  She denies change in her bowel movements.  Her last BM was this morning.  She denies vaginal bleeding or discharge.  She denies urinary symptoms.  She reports that she went to urgent care earlier this morning they referred her to the ED out of concern for possible appendicitis.   The history is provided by the patient and medical records.  Abdominal Pain Pain location:  RLQ Pain quality: aching   Pain radiation: Mild radiation to right low back. Pain severity:  Moderate Onset quality:  Gradual Duration:  6 hours Timing:  Constant Progression:  Worsening Chronicity:  New     Home Medications Prior to Admission medications   Medication Sig Start Date End Date Taking? Authorizing Provider  albuterol (ACCUNEB) 0.63 MG/3ML nebulizer solution Take 3 mLs (0.63 mg total) by nebulization every 4 (four) hours as needed for wheezing or shortness of breath. Patient not taking: Reported on 10/29/2021 05/06/21   Hazel Sams, PA-C  albuterol (VENTOLIN HFA) 108 (90 Base) MCG/ACT inhaler Inhale 1-2 puffs into the lungs every 6 (six) hours as needed for wheezing. 05/06/21   Hazel Sams, PA-C  amLODipine (NORVASC) 5 MG tablet Take 1 tablet (5 mg total) by mouth daily. Patient not taking: Reported  on 10/29/2021 04/25/20   Nicolette Bang, MD  atorvastatin (LIPITOR) 80 MG tablet Take 1 tablet (80 mg total) by mouth daily at 6 PM. 04/25/20   Nicolette Bang, MD  cetirizine (ZYRTEC) 10 MG tablet Take 1 tablet (10 mg total) by mouth daily. 01/17/21   Consuella Lose, MD  cyclobenzaprine (FLEXERIL) 10 MG tablet Take 1 tablet (10 mg total) by mouth 2 (two) times daily as needed for muscle spasms. Patient not taking: Reported on 10/29/2021 12/22/20   Raspet, Derry Skill, PA-C  diclofenac (CATAFLAM) 50 MG tablet Take 50 mg by mouth 2 (two) times daily as needed for pain. 02/27/21   [provider]  diphenhydrAMINE (BENADRYL) 50 MG tablet Take 1 tablet (50 mg total) by mouth daily as needed for itching. 01/17/21   Consuella Lose, MD  fluconazole (DIFLUCAN) 150 MG tablet Take 1 tablet (150 mg total) by mouth daily. -For your yeast infection, start the Diflucan (fluconazole)- Take one pill today (day 1). If you're still having symptoms in 3 days, take the second pill. Patient not taking: Reported on 10/29/2021 05/06/21   Hazel Sams, PA-C  fluticasone [facility]) 50 MCG/ACT nasal spray Place 1-2 sprays into both nostrils daily. 01/17/21   Consuella Lose, MD  glipiZIDE (GLUCOTROL) 10 MG tablet Take 1 tablet (10 mg total) by mouth daily before breakfast. Patient taking differently: Take 10 mg by mouth 2 (two) times daily as needed (high sugar). 09/29/21 10/29/21  Leath-Warren, Alda Lea, NP  ibuprofen (ADVIL) 800 MG  tablet Take 800 mg by mouth daily as needed for cramping or moderate pain.    [provider]  insulin detemir (LEVEMIR) 100 UNIT/ML FlexPen Inject 38 Units into the skin 2 (two) times daily. Patient taking differently: Inject 43 Units into the skin 2 (two) times daily. 09/29/21 10/29/21  Leath-Warren, Alda Lea, NP  Insulin Pen Needle (PEN NEEDLES) 32G X 4 MM MISC 1 each by Does not apply route 2 (two) times daily. 09/29/21 10/29/21  Leath-Warren, Alda Lea, NP   naproxen (NAPROSYN) 500 MG tablet Take 1 tablet (500 mg total) by mouth 2 (two) times daily. Patient not taking: Reported on 10/29/2021 12/22/20   Raspet, Junie Panning K, PA-C  naproxen sodium (ALEVE) 220 MG tablet Take 220 mg by mouth 2 (two) times daily as needed (cramping).    [provider]  oxyCODONE (OXY IR/ROXICODONE) 5 MG immediate release tablet Take 5 mg by mouth every 8 (eight) hours as needed for moderate pain. Patient not taking: Reported on 10/29/2021 02/06/21   [provider]  promethazine-dextromethorphan (PROMETHAZINE-DM) 6.25-15 MG/5ML syrup Take 5 mLs by mouth 4 (four) times daily as needed for cough. Patient not taking: Reported on 10/29/2021 05/06/21   Hazel Sams, PA-C  sitaGLIPtin (JANUVIA) 100 MG tablet Take 1 tablet (100 mg total) by mouth daily. Patient not taking: Reported on 10/29/2021 09/29/21 10/29/21  Leath-Warren, Alda Lea, NP  triamterene-hydrochlorothiazide (MAXZIDE-25) 37.5-25 MG tablet Take 1 tablet by mouth daily. Patient not taking: Reported on 10/29/2021 04/25/20   Nicolette Bang, MD      Allergies    Metformin and related, Penicillins, and Victoza [liraglutide]    Review of Systems   Review of Systems  Gastrointestinal:  Positive for abdominal pain.  All other systems reviewed and are negative.  Physical Exam Updated Vital Signs BP (!) 150/103 (BP Location: Left Wrist)   Pulse 78   Temp 98 F (36.7 C) (Oral)   Resp 18   Ht '5\' 7"'$  (1.702 m)   Wt 131.5 kg   SpO2 99%   BMI 45.42 kg/m  Physical Exam Vitals and nursing note reviewed.  Constitutional:      General: She is not in acute distress.    Appearance: Normal appearance. She is well-developed.  HENT:     Head: Normocephalic and atraumatic.  Eyes:     Conjunctiva/sclera: Conjunctivae normal.     Pupils: Pupils are equal, round, and reactive to light.  Cardiovascular:     Rate and Rhythm: Normal rate and regular rhythm.     Heart sounds: Normal heart sounds.   Pulmonary:     Effort: Pulmonary effort is normal. No respiratory distress.     Breath sounds: Normal breath sounds.  Abdominal:     General: There is no distension.     Palpations: Abdomen is soft.     Tenderness: There is abdominal tenderness.     Comments: Maximal tenderness to palpation overlying the right lower quadrant.  No appreciable rebound or guarding.  Healed superficial skin abscess noted in the right groin -just lateral to the right labia.  Musculoskeletal:        General: No deformity. Normal range of motion.     Cervical back: Normal range of motion and neck supple.  Skin:    General: Skin is warm and dry.  Neurological:     General: No focal deficit present.     Mental Status: She is alert and oriented to person, place, and time.    ED  Results / Procedures / Treatments   Labs (all labs ordered are listed, but only abnormal results are displayed) Labs Reviewed  COMPREHENSIVE METABOLIC PANEL  CBC WITH DIFFERENTIAL/PLATELET  LIPASE, BLOOD  I-STAT BETA HCG BLOOD, ED ([facility], [facility], [facility] ONLY)  I-STAT CHEM 8, ED    EKG None  Radiology No results found.  Procedures Procedures    Medications Ordered in ED Medications  sodium chloride 0.9 % bolus 1,000 mL (has no administration in time range)  ondansetron (ZOFRAN) injection 4 mg (has no administration in time range)  morphine (PF) 4 MG/ML injection 4 mg (has no administration in time range)    ED Course/ Medical Decision Making/ A&P                           Medical Decision Making Amount and/or Complexity of Data Reviewed Labs: ordered. Radiology: ordered.  Risk Prescription drug management.    Medical Screen Complete  This patient presented to the ED with complaint of abdominal pain.  This complaint involves an extensive number of treatment options. The initial differential diagnosis includes, but is not limited to, appendicitis, other intra-abdominal pathology, metabolic abnormality, etc.  This  presentation is: Acute, Self-Limited, Previously Undiagnosed, Uncertain Prognosis, Complicated, Systemic Symptoms, and Threat to Life/Bodily Function   Patient is presenting with complaint of right lower quadrant abdominal pain.  Patient's pain began this morning.  Patient's pain is associate with mild nausea.  Screening labs obtained are without significant abnormality.  CT imaging is without acute pathology.  Patient does feel improved after ED evaluation.  Given patient's improvement and lack of acute findings on work-up patient is appropriate for discharge home.  Patient is advised that if her pain returns or she has significant discomfort, fever, nausea, vomiting, etc. she should return to the ED for evaluation.  Importance of close follow-up is stressed.  Strict return precautions given and understood.   Co morbidities that complicated the patient's evaluation  Obesity, diabetes, asthma, fibromyalgia, bipolar, hypertension   Additional history obtained:  External records from outside sources obtained and reviewed including prior ED visits and prior Inpatient records.    Lab Tests:  I ordered and personally interpreted labs.  The pertinent results include: CBC, CMP, lipase, UA, hCG   Imaging Studies ordered:  I ordered imaging studies including CT of the pelvis I independently visualized and interpreted obtained imaging which showed NAD I agree with the radiologist interpretation.   Cardiac Monitoring:  The patient was maintained on a cardiac monitor.  I personally viewed and interpreted the cardiac monitor which showed an underlying rhythm of: NSR   Medicines ordered:  I ordered medication including morphine, Zofran for pain and nausea Reevaluation of the patient after these medicines showed that the patient: resolved   Problem List / ED Course:  Abdominal pain   Reevaluation:  After the interventions noted above, I reevaluated the patient and found  that they have: resolved   Disposition:  After consideration of the diagnostic results and the patients response to treatment, I feel that the patent would benefit from close outpatient follow-up.          Final Clinical Impression(s) / ED Diagnoses Final diagnoses:  Right lower quadrant abdominal pain    Rx / DC Orders ED Discharge Orders          Ordered    ondansetron (ZOFRAN) 4 MG tablet  Every 6 hours        11/22/21 1417  Valarie Merino, MD 11/22/21 [phone]

## 2021-11-22 NOTE — Discharge Instructions (Signed)
Recommend further evaluation the ED for your abdominal pain.

## 2021-11-24 ENCOUNTER — Telehealth: Payer: Self-pay

## 2021-11-24 NOTE — Telephone Encounter (Signed)
Transition Care Management Unsuccessful Follow-up Telephone Call  Date of discharge and from where:  11/22/2021 from [facility]  Attempts:  1st Attempt  Reason for unsuccessful TCM follow-up call:  Voice mail full

## 2021-11-25 NOTE — Telephone Encounter (Signed)
Transition Care Management Unsuccessful Follow-up Telephone Call  Date of discharge and from where:  11/22/2021 from [facility]  Attempts:  2nd Attempt  Reason for unsuccessful TCM follow-up call:  Voice mail full

## 2021-11-26 NOTE — Telephone Encounter (Signed)
Transition Care Management Unsuccessful Follow-up Telephone Call  Date of discharge and from where:  11/22/2021 from [facility]  Attempts:  3rd Attempt  Reason for unsuccessful TCM follow-up call:  Unable to reach patient

## 2021-12-25 ENCOUNTER — Encounter (HOSPITAL_COMMUNITY): Payer: Self-pay | Admitting: *Deleted

## 2021-12-25 ENCOUNTER — Ambulatory Visit (HOSPITAL_COMMUNITY)
Admission: EM | Admit: 2021-12-25 | Discharge: 2021-12-25 | Disposition: A | Discharge status: Home / Self Care | Payer: Medicaid Other | Attending: Family Medicine | Admitting: Family Medicine

## 2021-12-25 DIAGNOSIS — R42 Dizziness and giddiness: Secondary | ICD-10-CM

## 2021-12-25 DIAGNOSIS — R519 Headache, unspecified: Secondary | ICD-10-CM | POA: Diagnosis not present

## 2021-12-25 LAB — CBC
HCT: 30.2 % — ABNORMAL LOW (ref 36.0–46.0)
Hemoglobin: 9.3 g/dL — ABNORMAL LOW (ref 12.0–15.0)
MCH: 23.4 pg — ABNORMAL LOW (ref 26.0–34.0)
MCHC: 30.8 g/dL (ref 30.0–36.0)
MCV: 75.9 fL — ABNORMAL LOW (ref 80.0–100.0)
Platelets: 442 10*3/uL — ABNORMAL HIGH (ref 150–400)
RBC: 3.98 MIL/uL (ref 3.87–5.11)
RDW: 16.6 % — ABNORMAL HIGH (ref 11.5–15.5)
WBC: 5.1 10*3/uL (ref 4.0–10.5)
nRBC: 0 % (ref 0.0–0.2)

## 2021-12-25 LAB — BASIC METABOLIC PANEL
Anion gap: 8 (ref 5–15)
BUN: 9 mg/dL (ref 6–20)
CO2: 25 mmol/L (ref 22–32)
Calcium: 9.2 mg/dL (ref 8.9–10.3)
Chloride: 104 mmol/L (ref 98–111)
Creatinine, Ser: 0.73 mg/dL (ref 0.44–1.00)
GFR, Estimated: >60 mL/min (ref >60)
Glucose, Bld: 163 mg/dL — ABNORMAL HIGH (ref 70–99)
Potassium: 3.3 mmol/L — ABNORMAL LOW (ref 3.5–5.1)
Sodium: 137 mmol/L (ref 135–145)

## 2021-12-25 LAB — CBG MONITORING, ED: Glucose-Capillary: 167 mg/dL — ABNORMAL HIGH (ref 70–99)

## 2021-12-25 NOTE — Discharge Instructions (Signed)
Please seek prompt medical care if: You have: A very bad (severe) headache that is not helped by medicine. Trouble walking or weakness in your arms and legs. Clear or bloody fluid coming from your nose or ears. Changes in your seeing (vision). Jerky movements that you cannot control (seizure). You throw up (vomit). You lose balance. Your speech is slurred. You pass out.  These symptoms may be an emergency. Do not wait to see if the symptoms will go away. Get medical help right away. Call your local emergency services. Do not drive yourself to the hospital.  You have had labs (blood work) drawn today. We will call you with any significant abnormalities or if there is need to begin or change treatment or pursue further follow up.  You may also review your test results online through [place]. If you do not have a MyChart account, instructions to sign up should be on your discharge paperwork.

## 2021-12-25 NOTE — ED Provider Notes (Signed)
[place]   [id number] 12/25/21 Arrival Time: 0086  ASSESSMENT & PLAN:  1. Lightheadedness   2. Nonintractable headache, unspecified chronicity pattern, unspecified headache type    Unclear etiology. Normal neurologic exam. No suspicion for ICH or SAH. No indication for urgent neurodiagnostic imaging at this time.  Work note provided. She is comfortable with home observation.   Labs Reviewed  CBC - Abnormal; Notable for the following components:      Result Value   Hemoglobin 9.3 (*)    HCT 30.2 (*)    MCV 75.9 (*)    MCH 23.4 (*)    RDW 16.6 (*)    Platelets 442 (*)    All other components within normal limits  BASIC METABOLIC PANEL - Abnormal; Notable for the following components:   Potassium 3.3 (*)    Glucose, Bld 163 (*)    All other components within normal limits  CBG MONITORING, ED - Abnormal; Notable for the following components:   Glucose-Capillary 167 (*)    All other components within normal limits   CBG high; known DM. Hg around baseline of 9.5-10.    Discharge Instructions      Please seek prompt medical care if: You have: A very bad (severe) headache that is not helped by medicine. Trouble walking or weakness in your arms and legs. Clear or bloody fluid coming from your nose or ears. Changes in your seeing (vision). Jerky movements that you cannot control (seizure). You throw up (vomit). You lose balance. Your speech is slurred. You pass out.  These symptoms may be an emergency. Do not wait to see if the symptoms will go away. Get medical help right away. Call your local emergency services. Do not drive yourself to the hospital.  You have had labs (blood work) drawn today. We will call you with any significant abnormalities or if there is need to begin or change treatment or pursue further follow up.  You may also review your test results online through [place]. If you do not have a MyChart account, instructions to sign up should be  on your discharge paperwork.     Recommend:  Follow-up Information     [facility].   Specialty: Emergency Medicine Why: If symptoms worsen in any way. Contact information: [address] [phone] [place] [phone]                 Reviewed expectations re: course of current medical issues. Questions answered. Outlined signs and symptoms indicating need for more acute intervention. Patient verbalized understanding. After Visit Summary given.   SUBJECTIVE:  Ruth [name] is a 46 y.o. female who reports being at work this am; "suddenly felt lightheaded" with no change here; "squeezing feeling around my head". No vision/hearing changes. Normal ambulation. No extremity sensation changes or weakness. Not worst HA of life. No h/o similar. No SOB/CP.  Social History   Substance and Sexual Activity  Alcohol Use Not Currently   Social History   Tobacco Use  Smoking Status Every Day   Packs/day: 0.25   Types: Cigarettes  Smokeless Tobacco Never   Denies recreational drug use.   OBJECTIVE:  Vitals:   12/25/21 0939  BP: (!) 147/84  Pulse: 84  Resp: 16  Temp: 98.1 F (36.7 C)  TempSrc: Oral  SpO2: 100%    General appearance: alert; no distress Eyes: PERRLA; EOMI; conjunctiva normal HENT: normocephalic; atraumatic; TMs normal; nasal mucosa normal; oral mucosa  normal Neck: supple with FROM Lungs: unlabored; CTAB Heart: regular rate and rhythm Abdomen: soft, non-tender; bowel sounds normal Extremities: no cyanosis or edema; symmetrical with no gross deformities Skin: warm and dry Neurologic: normal gait; DTR's normal and symmetric; CN 2-12 grossly intact; normal ext strength and sensation throughout Psychological: alert and cooperative; normal mood and affect  Investigations: Results for orders placed or performed during the hospital encounter of 12/25/21  POC CBG  monitoring  Result Value Ref Range   Glucose-Capillary 167 (H) 70 - 99 mg/dL   Labs Reviewed  CBG MONITORING, ED - Abnormal; Notable for the following components:      Result Value   Glucose-Capillary 167 (*)    All other components within normal limits  CBC  BASIC METABOLIC PANEL   No results found.  Allergies  Allergen Reactions   Metformin And Related Diarrhea   Penicillins Other (See Comments)    Unknown; from childhood Has patient had a PCN reaction causing immediate rash, facial/tongue/throat swelling, SOB or lightheadedness with hypotension: Unknow Has patient had a PCN reaction causing severe rash involving mucus membranes or skin necrosis: Unknown Has patient had a PCN reaction that required hospitalization: Unknown Has patient had a PCN reaction occurring within the last 10 years: Unknown If all of the above answers are "NO", then may proceed with Cephalosporin use.    Victoza [Liraglutide] Itching    Past Medical History:  Diagnosis Date   Anemia    Anxiety    Arthritis    chronic back pain    Asthma    rarely uses inhaler   Bipolar 1 disorder (HCC)    Fibromyalgia    GERD (gastroesophageal reflux disease)    Headache    otc med   HLD (hyperlipidemia)    diet controlled   Hx of migraines    occ   Hypertension    Hypothyroidism    Obesity    Sleep apnea    does not use  cpap   Thrombocytosis 01/26/2020   Type 2 diabetes mellitus ([place])    Type 2   Social History   Socioeconomic History   Marital status: Married    Spouse name: Not on file   Number of children: Not on file   Years of education: Not on file   Highest education level: Not on file  Occupational History   Not on file  Tobacco Use   Smoking status: Every Day    Packs/day: 0.25    Types: Cigarettes   Smokeless tobacco: Never  Vaping Use   Vaping Use: Never used  Substance and Sexual Activity   Alcohol use: Not Currently   Drug use: Not Currently    Types: Marijuana   Sexual  activity: Not Currently    Birth control/protection: Surgical    Comment: tubal ligation  Other Topics Concern   Not on file  Social History Narrative   Not on file   Social Determinants of Health   Financial Resource Strain: Not on file  Food Insecurity: Not on file  Transportation Needs: Not on file  Physical Activity: Not on file  Stress: Not on file  Social Connections: Not on file  Intimate Partner Violence: Not on file   Family History  Problem Relation Age of Onset   Thyroid disease Mother    Diabetes Daughter    Diabetes Son    Breast cancer Neg Hx    Past Surgical History:  Procedure Laterality Date   ANTERIOR CERVICAL DECOMP/DISCECTOMY FUSION  N/A 01/16/2021   Procedure: Cervical five-six Anterior cervical decompression/discectomy/fusion;  Surgeon: Consuella Lose, MD;  Location: [facility];  Service: Neurosurgery;  Laterality: N/A;   CARPAL TUNNEL RELEASE Right    CESAREAN SECTION     x2   DILITATION & CURRETTAGE/HYSTROSCOPY WITH NOVASURE ABLATION N/A 12/07/2017   Procedure: DILATATION & CURETTAGE/HYSTEROSCOPY WITH ATTEMPED NOVASURE ABLATION, FAILED/ENDOMETERIAL ABLATION WITH HTA SYSTEM;  Surgeon: Lavonia Drafts, MD;  Location: [facility];  Service: Gynecology;  Laterality: N/A;   ELBOW SURGERY     "surgery for nerve pain"   GANGLION CYST EXCISION Right    right arm    RECTOVAGINAL FISTULA CLOSURE     after vag delivery   TONSILLECTOMY     childhood   TUBAL LIGATION         Vanessa Kick, MD 12/25/21 1327

## 2021-12-25 NOTE — ED Triage Notes (Addendum)
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 Denies any pain, n/v/d, or any other sxs. Denies vision changes. States "I feel like I've been drinking, but I haven't". Pt non-compliant with meds -- states she's not taking any meds currently and cannot afford insulin.

## 2021-12-26 ENCOUNTER — Telehealth (HOSPITAL_COMMUNITY): Payer: Self-pay | Admitting: Emergency Medicine

## 2021-12-26 MED ORDER — POTASSIUM CHLORIDE ER 10 MEQ PO TBCR: 10.0000 meq | EXTENDED_RELEASE_TABLET | Freq: Every day | ORAL | 0 refills | Status: DC

## 2022-01-07 ENCOUNTER — Telehealth: Payer: Self-pay

## 2022-01-07 NOTE — Telephone Encounter (Signed)
..   Medicaid Managed Care   Unsuccessful Outreach Note  01/07/2022 Name: Ruth [name] MRN: [id number] DOB: Jan 12, 1976  Referred by: Pcp, No Reason for referral : High Risk Managed Medicaid (I called the patient today to get her scheduled with the MM Team. I left my name and number on her VM.)   An unsuccessful telephone outreach was attempted today. The patient was referred to the case management team for assistance with care management and care coordination.   Follow Up Plan: The care management team will reach out to the patient again over the next 7-14 days.    [place]

## 2022-02-03 ENCOUNTER — Encounter (HOSPITAL_COMMUNITY): Payer: Self-pay | Admitting: Emergency Medicine

## 2022-02-03 ENCOUNTER — Ambulatory Visit (HOSPITAL_COMMUNITY)
Admission: EM | Admit: 2022-02-03 | Discharge: 2022-02-03 | Disposition: A | Discharge status: Home / Self Care | Payer: Medicaid Other | Attending: Family Medicine | Admitting: Family Medicine

## 2022-02-03 DIAGNOSIS — N939 Abnormal uterine and vaginal bleeding, unspecified: Secondary | ICD-10-CM | POA: Diagnosis not present

## 2022-02-03 DIAGNOSIS — R102 Pelvic and perineal pain: Secondary | ICD-10-CM | POA: Diagnosis not present

## 2022-02-03 LAB — COMPREHENSIVE METABOLIC PANEL
ALT: 11 U/L (ref 0–44)
AST: 13 U/L — ABNORMAL LOW (ref 15–41)
Albumin: 3.7 g/dL (ref 3.5–5.0)
Alkaline Phosphatase: 77 U/L (ref 38–126)
Anion gap: 6 (ref 5–15)
BUN: 11 mg/dL (ref 6–20)
CO2: 26 mmol/L (ref 22–32)
Calcium: 9.4 mg/dL (ref 8.9–10.3)
Chloride: 106 mmol/L (ref 98–111)
Creatinine, Ser: 0.67 mg/dL (ref 0.44–1.00)
GFR, Estimated: >60 mL/min (ref >60)
Glucose, Bld: 200 mg/dL — ABNORMAL HIGH (ref 70–99)
Potassium: 4.3 mmol/L (ref 3.5–5.1)
Sodium: 138 mmol/L (ref 135–145)
Total Bilirubin: 0.7 mg/dL (ref 0.3–1.2)
Total Protein: 7.6 g/dL (ref 6.5–8.1)

## 2022-02-03 LAB — CBC
HCT: 33 % — ABNORMAL LOW (ref 36.0–46.0)
Hemoglobin: 10.1 g/dL — ABNORMAL LOW (ref 12.0–15.0)
MCH: 23.4 pg — ABNORMAL LOW (ref 26.0–34.0)
MCHC: 30.6 g/dL (ref 30.0–36.0)
MCV: 76.4 fL — ABNORMAL LOW (ref 80.0–100.0)
Platelets: 492 10*3/uL — ABNORMAL HIGH (ref 150–400)
RBC: 4.32 MIL/uL (ref 3.87–5.11)
RDW: 16.8 % — ABNORMAL HIGH (ref 11.5–15.5)
WBC: 4.7 10*3/uL (ref 4.0–10.5)
nRBC: 0 % (ref 0.0–0.2)

## 2022-02-03 LAB — POC URINE PREG, ED: Preg Test, Ur: NEGATIVE

## 2022-02-03 MED ORDER — DICLOFENAC POTASSIUM 50 MG PO TABS: 50.0000 mg | ORAL_TABLET | Freq: Two times a day (BID) | ORAL | 0 refills | Status: DC | PRN

## 2022-02-03 MED ORDER — KETOROLAC TROMETHAMINE 30 MG/ML IJ SOLN
30.0000 mg | Freq: Once | INTRAMUSCULAR | Status: AC
  Administered 2022-02-03: 30 mg via INTRAMUSCULAR

## 2022-02-03 MED ORDER — KETOROLAC TROMETHAMINE 30 MG/ML IJ SOLN
INTRAMUSCULAR | Status: AC
  Filled 2022-02-03: qty 1

## 2022-02-03 NOTE — Discharge Instructions (Addendum)
The pregnancy test was negative  Staff will call you if there is anything positive on your swab  Take diclofenac 50 mg--1 tablet 2 times daily as needed for cramps.  We have drawn blood for checking for anemia and for low potassium.  Staff will notify you if there is anything significantly abnormal

## 2022-02-03 NOTE — ED Triage Notes (Addendum)
Pt reports since started her menstrual cycle on Friday having really bad lower abd pains and heavy bleeding. Went to [facility] hospital and was told couldn't be seen there since not pregnant. Reports heavy bleeding and saturating large pads every 45 minutes to an hour. Reports fatigue. Denies having large amounts of blood clots, reports more stringy.

## 2022-02-03 NOTE — ED Provider Notes (Signed)
[facility]    CSN: [id number] Arrival date & time: 02/03/22  0825      History   Chief Complaint Chief Complaint  Patient presents with   Abdominal Pain    HPI Ruth [name] is a 46 y.o. female.    Abdominal Pain  Here for heavy menstrual bleeding and lower abdominal cramping that began on July 28.  No fever or vomiting or nausea.  No dysuria.  No vaginal discharge or itching before the period began  This is normal time for her menstrual cycle to come on.  The cramping is not usually this bad.  Has had a tubal ligation and an ablation  Past Medical History:  Diagnosis Date   Anemia    Anxiety    Arthritis    chronic back pain    Asthma    rarely uses inhaler   Bipolar 1 disorder ([place])    Fibromyalgia    GERD (gastroesophageal reflux disease)    Headache    otc med   HLD (hyperlipidemia)    diet controlled   Hx of migraines    occ   Hypertension    Hypothyroidism    Obesity    Sleep apnea    does not use  cpap   Thrombocytosis 01/26/2020   Type 2 diabetes mellitus ([place])    Type 2    Patient Active Problem List   Diagnosis Date Noted   Stenosis of cervical spine with myelopathy ([place]) 01/16/2021   BMI 45.0-49.9, adult ([place]) 09/04/2020   Right arm pain 02/14/2020   Hyperlipidemia associated with type 2 diabetes mellitus ([place]) 09/19/2019   Abnormal uterine bleeding (AUB) 12/07/2017   Abnormal mammogram of both breasts 11/02/2016   Hypothyroidism 06/21/2009   Type 2 diabetes mellitus with peripheral neuropathy ([place]) 06/21/2009   BIPOLAR AFFECTIVE DISORDER 06/21/2009   PERSISTENT DISORDER INITIATING/MAINTAINING SLEEP 06/21/2009   INADEQUATE SLEEP HYGIENE 06/21/2009   OBSTRUCTIVE SLEEP APNEA 06/21/2009   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   CARPAL TUNNEL SYNDROME 06/20/2009   MERALGIA PARESTHETICA 06/20/2009   Essential hypertension 06/20/2009   FIBROMYALGIA 06/20/2009   CEPHALGIA 06/20/2009    Past Surgical History:  Procedure  Laterality Date   ANTERIOR CERVICAL DECOMP/DISCECTOMY FUSION N/A 01/16/2021   Procedure: Cervical five-six Anterior cervical decompression/discectomy/fusion;  Surgeon: Consuella Lose, MD;  Location: [facility];  Service: Neurosurgery;  Laterality: N/A;   CARPAL TUNNEL RELEASE Right    CESAREAN SECTION     x2   DILITATION & CURRETTAGE/HYSTROSCOPY WITH NOVASURE ABLATION N/A 12/07/2017   Procedure: DILATATION & CURETTAGE/HYSTEROSCOPY WITH ATTEMPED NOVASURE ABLATION, FAILED/ENDOMETERIAL ABLATION WITH HTA SYSTEM;  Surgeon: Lavonia Drafts, MD;  Location: [facility];  Service: Gynecology;  Laterality: N/A;   ELBOW SURGERY     "surgery for nerve pain"   GANGLION CYST EXCISION Right    right arm    RECTOVAGINAL FISTULA CLOSURE     after vag delivery   TONSILLECTOMY     childhood   TUBAL LIGATION      OB History     Gravida  7   Para  5   Term  4   Preterm  1   AB  2   Living  5      SAB  2   IAB      Ectopic      Multiple      Live Births  5        Obstetric Comments  Vag x 2>c/s emergency>vbac>c/s and BTL H/o RV fistula  and then repaired after vag delivery          Home Medications    Prior to Admission medications   Medication Sig Start Date End Date Taking? Authorizing Provider  albuterol (ACCUNEB) 0.63 MG/3ML nebulizer solution Take 3 mLs (0.63 mg total) by nebulization every 4 (four) hours as needed for wheezing or shortness of breath. Patient not taking: Reported on 10/29/2021 05/06/21   Hazel Sams, PA-C  albuterol (VENTOLIN HFA) 108 (90 Base) MCG/ACT inhaler Inhale 1-2 puffs into the lungs every 6 (six) hours as needed for wheezing. 05/06/21   Hazel Sams, PA-C  amLODipine (NORVASC) 5 MG tablet Take 1 tablet (5 mg total) by mouth daily. Patient not taking: Reported on 10/29/2021 04/25/20   Nicolette Bang, MD  atorvastatin (LIPITOR) 80 MG tablet Take 1 tablet (80 mg total) by mouth daily at 6 PM. 04/25/20   Nicolette Bang, MD   cetirizine (ZYRTEC) 10 MG tablet Take 1 tablet (10 mg total) by mouth daily. 01/17/21   Consuella Lose, MD  diclofenac (CATAFLAM) 50 MG tablet Take 1 tablet (50 mg total) by mouth 2 (two) times daily as needed. 02/03/22   Barrett Henle, MD  diphenhydrAMINE (BENADRYL) 50 MG tablet Take 1 tablet (50 mg total) by mouth daily as needed for itching. 01/17/21   Consuella Lose, MD  fluticasone (FLONASE) 50 MCG/ACT nasal spray Place 1-2 sprays into both nostrils daily. 01/17/21   Consuella Lose, MD  glipiZIDE (GLUCOTROL) 10 MG tablet Take 1 tablet (10 mg total) by mouth daily before breakfast. Patient taking differently: Take 10 mg by mouth 2 (two) times daily as needed (high sugar). 09/29/21 10/29/21  Leath-Warren, Alda Lea, NP  insulin detemir (LEVEMIR) 100 UNIT/ML FlexPen Inject 38 Units into the skin 2 (two) times daily. Patient taking differently: Inject 43 Units into the skin 2 (two) times daily. 09/29/21 10/29/21  Leath-Warren, Alda Lea, NP  Insulin Pen Needle (PEN NEEDLES) 32G X 4 MM MISC 1 each by Does not apply route 2 (two) times daily. 09/29/21 10/29/21  Leath-Warren, Alda Lea, NP  potassium chloride (KLOR-CON) 10 MEQ tablet Take 1 tablet (10 mEq total) by mouth daily for 5 days. 12/26/21 12/31/21  Barrett Henle, MD    Family History Family History  Problem Relation Age of Onset   Thyroid disease Mother    Diabetes Daughter    Diabetes Son    Breast cancer Neg Hx     Social History Social History   Tobacco Use   Smoking status: Every Day    Packs/day: 0.25    Types: Cigarettes   Smokeless tobacco: Never  Vaping Use   Vaping Use: Never used  Substance Use Topics   Alcohol use: Not Currently   Drug use: Not Currently    Types: Marijuana     Allergies   Metformin and related, Penicillins, and Victoza [liraglutide]   Review of Systems Review of Systems  Gastrointestinal:  Positive for abdominal pain.     Physical Exam Triage Vital Signs ED Triage  Vitals [02/03/22 0902]  Enc Vitals Group     BP (!) 121/93     Pulse Rate 83     Resp 18     Temp 97.8 F (36.6 C)     Temp Source Oral     SpO2 98 %     Weight      Height      Head Circumference      Peak Flow  Pain Score 10     Pain Loc      Pain Edu?      Excl. in [place]?    No data found.  Updated Vital Signs BP (!) 121/93 (BP Location: Right Arm)   Pulse 83   Temp 97.8 F (36.6 C) (Oral)   Resp 18   LMP 01/30/2022   SpO2 98%   Visual Acuity Right Eye Distance:   Left Eye Distance:   Bilateral Distance:    Right Eye Near:   Left Eye Near:    Bilateral Near:     Physical Exam Vitals reviewed.  Constitutional:      General: She is not in acute distress.    Appearance: She is not ill-appearing, toxic-appearing or diaphoretic.  HENT:     Mouth/Throat:     Mouth: Mucous membranes are moist.  Eyes:     Extraocular Movements: Extraocular movements intact.     Conjunctiva/sclera: Conjunctivae normal.     Pupils: Pupils are equal, round, and reactive to light.  Cardiovascular:     Rate and Rhythm: Normal rate and regular rhythm.     Heart sounds: No murmur heard. Pulmonary:     Effort: Pulmonary effort is normal.     Breath sounds: Normal breath sounds.  Abdominal:     General: There is no distension.     Palpations: Abdomen is soft. There is no mass.     Comments: There is some tenderness in the upper quadrants of her abdomen.  There is worse tenderness in her left lower quadrant and suprapubic area.  There is also some tenderness in her right lower quadrant  Musculoskeletal:     Cervical back: Neck supple.  Lymphadenopathy:     Cervical: No cervical adenopathy.  Neurological:     Mental Status: She is alert.      UC Treatments / Results  Labs (all labs ordered are listed, but only abnormal results are displayed) Labs Reviewed  CBC  COMPREHENSIVE METABOLIC PANEL  POC URINE PREG, ED  CERVICOVAGINAL ANCILLARY ONLY    EKG   Radiology No  results found.  Procedures Procedures (including critical care time)  Medications Ordered in UC Medications  ketorolac (TORADOL) 30 MG/ML injection 30 mg (has no administration in time range)    Initial Impression / Assessment and Plan / UC Course  I have reviewed the triage vital signs and the nursing notes.  Pertinent labs & imaging results that were available during my care of the patient were reviewed by me and considered in my medical decision making (see chart for details).     UPT is negative.  She is given Toradol and diclofenac for pain relief.  She said the diclofenac it helped before.  She is given OB/GYN contact information.  If she worsens anyway she is to go to the hospital. Staff will notify her and treat per protocol any positives on the swab Final Clinical Impressions(s) / UC Diagnoses   Final diagnoses:  Pelvic pain in female  Abnormal uterine bleeding     Discharge Instructions      The pregnancy test was negative  Staff will call you if there is anything positive on your swab  Take diclofenac 50 mg--1 tablet 2 times daily as needed for cramps.  We have drawn blood for checking for anemia and for low potassium.  Staff will notify you if there is anything significantly abnormal     ED Prescriptions     Medication Sig Dispense Auth.  Provider   diclofenac (CATAFLAM) 50 MG tablet Take 1 tablet (50 mg total) by mouth 2 (two) times daily as needed. 60 tablet Nathaniel Wakeley, Gwenlyn Perking, MD      PDMP not reviewed this encounter.   Barrett Henle, MD 02/03/22 1009

## 2022-02-04 ENCOUNTER — Telehealth (HOSPITAL_COMMUNITY): Payer: Self-pay | Admitting: Emergency Medicine

## 2022-02-04 LAB — CERVICOVAGINAL ANCILLARY ONLY
Bacterial Vaginitis (gardnerella): POSITIVE — AB
Candida Glabrata: NEGATIVE
Candida Vaginitis: NEGATIVE
Chlamydia: NEGATIVE
Comment: NEGATIVE
Comment: NEGATIVE
Comment: NEGATIVE
Comment: NEGATIVE
Comment: NEGATIVE
Comment: NORMAL
Neisseria Gonorrhea: NEGATIVE
Trichomonas: NEGATIVE

## 2022-02-04 MED ORDER — METRONIDAZOLE 500 MG PO TABS: 500.0000 mg | ORAL_TABLET | Freq: Two times a day (BID) | ORAL | 0 refills | Status: DC

## 2022-02-20 ENCOUNTER — Other Ambulatory Visit: Payer: Self-pay | Admitting: Licensed Clinical Social Worker

## 2022-02-20 NOTE — Patient Outreach (Signed)
Medicaid Managed Care Social Work Note  02/20/2022 Name:  Ruth [name] MRN:  [id number] DOB:  Mar 05, 1976  Ruth [name] is an 46 y.o. year old female who is a primary patient of Pcp, No.  The Medicaid Managed Care Coordination team was consulted for assistance with:  Disease Management and care coordination needs  Ruth [name] was given information about Medicaid Managed Care Coordination team services today. Ruth [name] Patient did not agree to services.  Patient provided Medicaid Managed Care team contact information and advised to reach out if in need of care coordination or care management services.  Primary care provider advised of patient declination of services.  Engaged with patient  for by telephone forinitial visit in response to referral for case management and/or care coordination services.   Assessments/Interventions:  Review of past medical history, allergies, medications, health status, including review of consultants reports, laboratory and other test data, was performed as part of comprehensive evaluation and provision of chronic care management services.  Care Plan                 Allergies  Allergen Reactions   Metformin And Related Diarrhea   Penicillins Other (See Comments)    Unknown; from childhood Has patient had a PCN reaction causing immediate rash, facial/tongue/throat swelling, SOB or lightheadedness with hypotension: Unknow Has patient had a PCN reaction causing severe rash involving mucus membranes or skin necrosis: Unknown Has patient had a PCN reaction that required hospitalization: Unknown Has patient had a PCN reaction occurring within the last 10 years: Unknown If all of the above answers are "NO", then may proceed with Cephalosporin use.    Victoza [Liraglutide] Itching    Medications Reviewed Today     Reviewed by Ruth [name] (Registered Nurse) on 02/03/22 at [place] List Status: <None>   Medication  Order Taking? Sig Documenting Provider Last Dose Status Informant  albuterol (ACCUNEB) 0.63 MG/3ML nebulizer solution [id number]  Take 3 mLs (0.63 mg total) by nebulization every 4 (four) hours as needed for wheezing or shortness of breath.  Patient not taking: Reported on 10/29/2021   Ruth [name]  Active Self  albuterol (VENTOLIN HFA) 108 (90 Base) MCG/ACT inhaler [id number]  Inhale 1-2 puffs into the lungs every 6 (six) hours as needed for wheezing. Ruth [name]  Active Self  amLODipine (NORVASC) 5 MG tablet [id number]  Take 1 tablet (5 mg total) by mouth daily.  Patient not taking: Reported on 10/29/2021   Ruth [name]  Active Self           Med Note Ruth [name], Ruth [name]   Wed Oct 29, 2021  1:43 PM)    atorvastatin (LIPITOR) 80 MG tablet [id number]  Take 1 tablet (80 mg total) by mouth daily at 6 PM. Ruth [name]  Active Self           Med Note Ruth [name], [facility] [name]   Wed Oct 29, 2021  1:43 PM)    cetirizine (ZYRTEC) 10 MG tablet [id number]  Take 1 tablet (10 mg total) by mouth daily. Ruth [name]  Active Self           Med Note Ruth [name], Ruth [name]   Wed Oct 29, 2021  1:43 PM)    cyclobenzaprine (FLEXERIL) 10 MG tablet [id number]  Take 1 tablet (10 mg total) by mouth 2 (two) times daily as needed for muscle spasms.  Patient not taking: Reported on 10/29/2021  Ruth [name], Ruth Skill, [name]  Active Self           Med Note Ruth [name], Ruth [name]   Wed Oct 29, 2021  1:42 PM)    diclofenac (CATAFLAM) 50 MG tablet [id number]  Take 50 mg by mouth 2 (two) times daily as needed for pain. Provider, Historical, [name]  Active Self           Med Note Ruth [name], Ruth [name]   Wed Oct 29, 2021  1:45 PM) Pt states uses med for menstrual cramps but dispense hx doesn't support claim  diphenhydrAMINE (BENADRYL) 50 MG tablet [id number]  Take 1 tablet (50 mg total) by mouth daily as needed for itching. Ruth [name]  Active Self  fluconazole (DIFLUCAN) 150 MG  tablet [id number]  Take 1 tablet (150 mg total) by mouth daily. -For your yeast infection, start the Diflucan (fluconazole)- Take one pill today (day 1). If you're still having symptoms in 3 days, take the second pill.  Patient not taking: Reported on 10/29/2021   Ruth [name]  Active Self  fluticasone [facility]) 50 MCG/ACT nasal spray [id number]  Place 1-2 sprays into both nostrils daily. Ruth [name]  Active Self           Med Note Ruth [name], Ruth [name]   Wed Oct 29, 2021  1:42 PM)    glipiZIDE (GLUCOTROL) 10 MG tablet [id number]  Take 1 tablet (10 mg total) by mouth daily before breakfast.  Patient taking differently: Take 10 mg by mouth 2 (two) times daily as needed (high sugar).   Ruth Men, NP  Expired 10/29/21 2359 Self           Med Note Ruth [name], Ruth [name]   Wed Oct 29, 2021  1:41 PM) Pt states is a prn med but was dispensed as QD  ibuprofen (ADVIL) 800 MG tablet [id number]  Take 800 mg by mouth daily as needed for cramping or moderate pain. Provider, Historical, [name]  Active Self  insulin detemir (LEVEMIR) 100 UNIT/ML FlexPen [id number]  Inject 38 Units into the skin 2 (two) times daily.  Patient taking differently: Inject 43 Units into the skin 2 (two) times daily.   Ruth Men, NP  Expired 10/29/21 2359 Self  Insulin Pen Needle (PEN NEEDLES) 32G X 4 MM MISC [id number]  1 each by Does not apply route 2 (two) times daily. Ruth Men, NP  Expired 10/29/21 2359 Self  naproxen (NAPROSYN) 500 MG tablet [id number]  Take 1 tablet (500 mg total) by mouth 2 (two) times daily.  Patient not taking: Reported on 10/29/2021   Terrilee Croak, [name]  Active Self           Med Note Ruth [name], Ruth [name]   Wed Oct 29, 2021  1:42 PM)    naproxen sodium (ALEVE) 220 MG tablet [id number]  Take 220 mg by mouth 2 (two) times daily as needed (cramping). Provider, Historical, [name]  Active Self  ondansetron (ZOFRAN) 4 MG tablet [id number]  Take 1 tablet (4 mg total)  by mouth every 6 (six) hours. Valarie Merino, [name]  Active   oxyCODONE (OXY IR/ROXICODONE) 5 MG immediate release tablet [id number]  Take 5 mg by mouth every 8 (eight) hours as needed for moderate pain.  Patient not taking: Reported on 10/29/2021   Provider, Historical, [name]  Active Self           Med Note Ruth [name], Ruth [name]   Wed Oct 29, 2021  1:42 PM)  potassium chloride (KLOR-CON) 10 MEQ tablet [id number]  Take 1 tablet (10 mEq total) by mouth daily for 5 days. Barrett Henle, [name]  Expired 12/31/21 2359   promethazine-dextromethorphan (PROMETHAZINE-DM) 6.25-15 MG/5ML syrup [id number]  Take 5 mLs by mouth 4 (four) times daily as needed for cough.  Patient not taking: Reported on 10/29/2021   Ruth [name]  Active Self  sitaGLIPtin (JANUVIA) 100 MG tablet [id number]  Take 1 tablet (100 mg total) by mouth daily.  Patient not taking: Reported on 10/29/2021   Ruth Men, NP  Active Self  triamterene-hydrochlorothiazide (MAXZIDE-25) 37.5-25 MG tablet [id number]  Take 1 tablet by mouth daily.  Patient not taking: Reported on 10/29/2021   Ruth [name]  Active Self           Med Note Ruth [name] [place] Oct 29, 2021  1:42 PM)              Patient Active Problem List   Diagnosis Date Noted   Stenosis of cervical spine with myelopathy ([place]) 01/16/2021   BMI 45.0-49.9, adult ([place]) 09/04/2020   Right arm pain 02/14/2020   Hyperlipidemia associated with type 2 diabetes mellitus ([place]) 09/19/2019   Abnormal uterine bleeding (AUB) 12/07/2017   Abnormal mammogram of both breasts 11/02/2016   Hypothyroidism 06/21/2009   Type 2 diabetes mellitus with peripheral neuropathy ([place]) 06/21/2009   BIPOLAR AFFECTIVE DISORDER 06/21/2009   PERSISTENT DISORDER INITIATING/MAINTAINING SLEEP 06/21/2009   INADEQUATE SLEEP HYGIENE 06/21/2009   OBSTRUCTIVE SLEEP APNEA 06/21/2009   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   Follow up:  Patient requests no follow-up at this time.  Plan: The Managed Medicaid care management team is available to follow up with the patient after provider conversation with the patient regarding recommendation for care management engagement and subsequent re-referral to the care management team.   Eula Fried, BSW, MSW, LCSW Managed Medicaid [email or web address] Phone: [phone]

## 2022-02-23 DIAGNOSIS — J111 Influenza due to unidentified influenza virus with other respiratory manifestations: Secondary | ICD-10-CM | POA: Diagnosis not present

## 2022-02-26 ENCOUNTER — Encounter (HOSPITAL_COMMUNITY): Payer: Self-pay | Admitting: *Deleted

## 2022-02-26 ENCOUNTER — Ambulatory Visit (HOSPITAL_COMMUNITY)
Admission: EM | Admit: 2022-02-26 | Discharge: 2022-02-26 | Disposition: A | Discharge status: Home / Self Care | Payer: Medicaid Other | Attending: Internal Medicine | Admitting: Internal Medicine

## 2022-02-26 DIAGNOSIS — M79602 Pain in left arm: Secondary | ICD-10-CM

## 2022-02-26 MED ORDER — ACETAMINOPHEN 325 MG PO TABS
975.0000 mg | ORAL_TABLET | Freq: Once | ORAL | Status: AC
  Administered 2022-02-26: 975 mg via ORAL

## 2022-02-26 MED ORDER — NAPROXEN 500 MG PO TABS: 500.0000 mg | ORAL_TABLET | Freq: Two times a day (BID) | ORAL | 0 refills | Status: DC

## 2022-02-26 MED ORDER — ACETAMINOPHEN 325 MG PO TABS
ORAL_TABLET | ORAL | Status: AC
  Filled 2022-02-26: qty 3

## 2022-02-26 MED ORDER — TIZANIDINE HCL 4 MG PO TABS: 4.0000 mg | ORAL_TABLET | Freq: Four times a day (QID) | ORAL | 0 refills | Status: DC | PRN

## 2022-02-26 MED ORDER — KETOROLAC TROMETHAMINE 30 MG/ML IJ SOLN
30.0000 mg | Freq: Once | INTRAMUSCULAR | Status: AC
  Administered 2022-02-26: 30 mg via INTRAMUSCULAR

## 2022-02-26 MED ORDER — KETOROLAC TROMETHAMINE 30 MG/ML IJ SOLN
INTRAMUSCULAR | Status: AC
  Filled 2022-02-26: qty 1

## 2022-02-26 NOTE — Discharge Instructions (Signed)
You have been evaluated in the today for your left arm pain. Your pain is most likely muscle strain which will improve on its own with time.   Take naproxen with food every to hours for the next 2-3 days consistently to treat pain and inflammation, then as needed. Do not take any other NSAID containing medicine when taking naproxen like ibuprofen, Advil, Aleve, motrin, aspirin, Voltaren, diclofenac, and other medicines. Take this medication with a snack to avoid stomach upset.   Your first dose of naproxen when you get home may be at 8 PM since you were given ketorolac injection in the clinic today.  You may also take Zanaflex muscle relaxer.  Do not take this medication and drive or drink alcohol as it can make you sleepy.  Mainly use this medicine at nighttime as needed.  Apply heat and perform gentle range of motion exercises to the area of greatest pain to prevent muscle stiffness and provide further pain relief.   Call [facility] to schedule an appointment for follow-up.  Return to urgent care if your symptoms do not improve in the next 3 to 4 days with medications and interventions recommended today.  If you develop any new or worsening symptoms, please return to urgent care.  If your symptoms are severe, please go to the emergency room.  I hope you feel better!

## 2022-02-26 NOTE — ED Triage Notes (Signed)
Patient states that she has left arm pain since Tuesday she has been using heating pad for some relief.

## 2022-02-26 NOTE — ED Provider Notes (Signed)
[place]    CSN: [id number] Arrival date & time: 02/26/22  2703      History   Chief Complaint Chief Complaint  Patient presents with   Arm Pain    HPI Ruth [name] is a 46 y.o. female.   Patient presents urgent care for evaluation of left arm pain that has worsened over the last 2 days.  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  Pain is mostly to the left neck muscles and the deltoid muscle of the left arm.  Patient states that she has decree strength to the left hand due to the severe pain.  Pain is currently a 10 on a scale of 0-10 and she has been applying heat to the arm with temporary relief.  Pain returns significantly when heat is removed from the neck/arm.  She had some leftover Flexeril from a previous injury and has been taking this at home without much relief of her symptoms.  She has not attempted use of NSAID medications or Tylenol.  Range of motion to the left upper extremity is decreased due to the pain.  She cannot identify any relieving factors for pain other than heat and cannot identify any triggering factors for pain other than movement.     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/2021   BMI 45.0-49.9, adult ([place]) 09/04/2020   Right arm pain 02/14/2020   Hyperlipidemia  associated with type 2 diabetes mellitus ([place]) 09/19/2019   Abnormal uterine bleeding (AUB) 12/07/2017   Abnormal mammogram of both breasts 11/02/2016   Hypothyroidism 06/21/2009   Type 2 diabetes mellitus with peripheral neuropathy ([place]) 06/21/2009   BIPOLAR AFFECTIVE DISORDER 06/21/2009   PERSISTENT DISORDER INITIATING/MAINTAINING SLEEP 06/21/2009   INADEQUATE SLEEP HYGIENE 06/21/2009   OBSTRUCTIVE SLEEP APNEA 06/21/2009   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   CARPAL TUNNEL SYNDROME 06/20/2009   MERALGIA PARESTHETICA 06/20/2009   Essential hypertension 06/20/2009   FIBROMYALGIA 06/20/2009   CEPHALGIA 06/20/2009    Past Surgical History:  Procedure Laterality Date   ANTERIOR CERVICAL DECOMP/DISCECTOMY FUSION N/A 01/16/2021   Procedure: Cervical five-six Anterior cervical decompression/discectomy/fusion;  Surgeon: Consuella Lose, MD;  Location: [facility];  Service: Neurosurgery;  Laterality: N/A;   CARPAL TUNNEL RELEASE Right    CESAREAN SECTION     x2   DILITATION & CURRETTAGE/HYSTROSCOPY WITH NOVASURE ABLATION N/A 12/07/2017   Procedure: DILATATION & CURETTAGE/HYSTEROSCOPY WITH ATTEMPED NOVASURE ABLATION, FAILED/ENDOMETERIAL ABLATION WITH HTA SYSTEM;  Surgeon: Lavonia Drafts, MD;  Location: [facility];  Service: Gynecology;  Laterality: N/A;   ELBOW SURGERY     "surgery  for nerve pain"   GANGLION CYST EXCISION Right    right arm    RECTOVAGINAL FISTULA CLOSURE     after vag delivery   TONSILLECTOMY     childhood   TUBAL LIGATION      OB History     Gravida  7   Para  5   Term  4   Preterm  1   AB  2   Living  5      SAB  2   IAB      Ectopic      Multiple      Live Births  5        Obstetric Comments  Vag x 2>c/s emergency>vbac>c/s and BTL 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 Authorizing Provider  albuterol (VENTOLIN HFA)  108 (90 Base) MCG/ACT inhaler Inhale 1-2 puffs into the lungs every 6 (six) hours as needed for wheezing. 05/06/21  Yes Hazel Sams, PA-C  cetirizine (ZYRTEC) 10 MG tablet Take 1 tablet (10 mg total) by mouth daily. 01/17/21  Yes Consuella Lose, MD  diphenhydrAMINE (BENADRYL) 50 MG tablet Take 1 tablet (50 mg total) by mouth daily as needed for itching. 01/17/21  Yes Consuella Lose, MD  fluticasone (FLONASE) 50 MCG/ACT nasal spray Place 1-2 sprays into both nostrils daily. 01/17/21  Yes Consuella Lose, MD  naproxen (NAPROSYN) 500 MG tablet Take 1 tablet (500 mg total) by mouth 2 (two) times daily. 02/26/22  Yes Talbot Grumbling, FNP  potassium chloride (KLOR-CON) 10 MEQ tablet Take 1 tablet (10 mEq total) by mouth daily for 5 days. 12/26/21 02/26/22 Yes Banister, Gwenlyn Perking, MD  tiZANidine (ZANAFLEX) 4 MG tablet Take 1 tablet (4 mg total) by mouth every 6 (six) hours as needed for muscle spasms. 02/26/22  Yes Talbot Grumbling, FNP  albuterol (ACCUNEB) 0.63 MG/3ML nebulizer solution Take 3 mLs (0.63 mg total) by nebulization every 4 (four) hours as needed for wheezing or shortness of breath. Patient not taking: Reported on 10/29/2021 05/06/21   Hazel Sams, PA-C  amLODipine (NORVASC) 5 MG tablet Take 1 tablet (5 mg total) by mouth daily. Patient not taking: Reported on 10/29/2021 04/25/20   Nicolette Bang, MD  atorvastatin (LIPITOR) 80 MG tablet Take 1 tablet (80 mg total) by mouth daily at 6 PM. 04/25/20   Nicolette Bang, MD  diclofenac (CATAFLAM) 50 MG tablet Take 1 tablet (50 mg total) by mouth 2 (two) times daily as needed. 02/03/22   Barrett Henle, MD  glipiZIDE (GLUCOTROL) 10 MG tablet Take 1 tablet (10 mg total) by mouth daily before breakfast. Patient taking differently: Take 10 mg by mouth 2 (two) times daily as needed (high sugar). 09/29/21 10/29/21  Leath-Warren, Alda Lea, NP  insulin detemir (LEVEMIR) 100 UNIT/ML FlexPen Inject 38 Units into the  skin 2 (two) times daily. Patient taking differently: Inject 43 Units into the skin 2 (two) times daily. 09/29/21 10/29/21  Leath-Warren, Alda Lea, NP  Insulin Pen Needle (PEN NEEDLES) 32G X 4 MM MISC 1 each by Does not apply route 2 (two) times daily. 09/29/21 10/29/21  Leath-Warren, Alda Lea, NP  metroNIDAZOLE (FLAGYL) 500 MG tablet Take 1 tablet (500 mg total) by mouth 2 (two) times daily. 02/04/22   Lamptey, Myrene Galas, MD    Family History Family  History  Problem Relation Age of Onset   Thyroid disease Mother    Diabetes Daughter    Diabetes Son    Breast cancer Neg Hx     Social History Social History   Tobacco Use   Smoking status: Every Day    Packs/day: 0.25    Types: Cigarettes   Smokeless tobacco: Never  Vaping Use   Vaping Use: Never used  Substance Use Topics   Alcohol use: Not Currently   Drug use: Not Currently    Types: Marijuana     Allergies   Metformin and related, Penicillins, and Victoza [liraglutide]   Review of Systems Review of Systems Per HPI  Physical Exam Triage Vital Signs ED Triage Vitals  Enc Vitals Group     BP 02/26/22 1017 (!) 135/97     Pulse Rate 02/26/22 1017 85     Resp 02/26/22 1017 18     Temp 02/26/22 1017 98.6 F (37 C)     Temp Source 02/26/22 1017 Oral     SpO2 02/26/22 1017 99 %     Weight --      Height --      Head Circumference --      Peak Flow --      Pain Score 02/26/22 1015 10     Pain Loc --      Pain Edu? --      Excl. in [place]? --    No data found.  Updated Vital Signs BP (!) 135/97 (BP Location: Right Arm)   Pulse 85   Temp 98.6 F (37 C) (Oral)   Resp 18   LMP 01/30/2022   SpO2 99%   Visual Acuity Right Eye Distance:   Left Eye Distance:   Bilateral Distance:    Right Eye Near:   Left Eye Near:    Bilateral Near:     Physical Exam Vitals and nursing note reviewed.  Constitutional:      Appearance: Normal appearance. She is not ill-appearing or toxic-appearing.     Comments: Very  pleasant patient sitting on exam in position of comfort table in no acute distress.   HENT:     Head: Normocephalic and atraumatic.     Right Ear: Hearing and external ear normal.     Left Ear: Hearing and external ear normal.     Nose: Nose normal.     Mouth/Throat:     Lips: Pink.     Mouth: Mucous membranes are moist.  Eyes:     General: Lids are normal. Vision grossly intact. Gaze aligned appropriately.     Extraocular Movements: Extraocular movements intact.     Conjunctiva/sclera: Conjunctivae normal.  Cardiovascular:     Rate and Rhythm: Normal rate and regular rhythm.     Heart sounds: Normal heart sounds, S1 normal and S2 normal.  Pulmonary:     Effort: Pulmonary effort is normal. No respiratory distress.     Breath sounds: Normal breath sounds and air entry.  Abdominal:     Palpations: Abdomen is soft.  Musculoskeletal:     Cervical back: Neck supple.     Comments: Muscular tenderness to palpation of the left shoulder and neck extending to the deltoid muscle. Range of motion to neck is normal with 5/5 strength against resistance with head movement.  LUE: sensation intact distal to tenderness. 4/5 grip strength to left hand secondary to tenderness at the shoulder joint. Limited range of motion at the shoulder joint due to pain.  Sensation intact bilaterally. Cap refill less than 3. +2 radial pulses bilaterally. No crepitus, warmth, obvious swelling, ecchymosis, or evidence of injury/trauma.   Skin:    General: Skin is warm and dry.     Capillary Refill: Capillary refill takes less than 2 seconds.     Findings: No rash.  Neurological:     General: No focal deficit present.     Mental Status: She is alert and oriented to person, place, and time. Mental status is at baseline.     Cranial Nerves: No dysarthria or facial asymmetry.     Gait: Gait is intact.  Psychiatric:        Mood and Affect: Mood normal.        Speech: Speech normal.        Behavior: Behavior normal.         Thought Content: Thought content normal.        Judgment: Judgment normal.      UC Treatments / Results  Labs (all labs ordered are listed, but only abnormal results are displayed) Labs Reviewed - No data to display  EKG   Radiology No results found.  Procedures Procedures (including critical care time)  Medications Ordered in UC Medications  ketorolac (TORADOL) 30 MG/ML injection 30 mg (30 mg Intramuscular Given 02/26/22 1114)  acetaminophen (TYLENOL) tablet 975 mg (975 mg Oral Given 02/26/22 1114)    Initial Impression / Assessment and Plan / UC Course  I have reviewed the triage vital signs and the nursing notes.  Pertinent labs & imaging results that were available during my care of the patient were reviewed by me and considered in my medical decision making (see chart for details).   1. Left arm pain Symptoms and physical exam consistent with acute strain of the left shoulder/arm. Deferred imaging due to lack of trauma/injury associated with symptoms. Patient given ketorolac '30mg'$  IM injection and '975mg'$  tylenol in clinic for pain relief. Advised use of naproxen '500mg'$  every 12 hours for the next 2-3 days to reduce acute inflammation and pain, then as needed. NSAID to be taken with food to avoid GI upset. Zanaflex muscle relaxer may be used every 6 hours as needed for muscle spasm. Advised to not use Zanaflex with alcohol, driving, or work due to drowsiness side effect. Heat and gentle range of motion exercises advised. Patient to follow-up with [facility] as she is already established patient. Patient agreeable with plan.   Discussed physical exam and available lab work findings in clinic with patient.  Counseled patient regarding appropriate use of medications and potential side effects for all medications recommended or prescribed today. Discussed red flag signs and symptoms of worsening condition,when to call the PCP office, return to urgent care, and when to  seek higher level of care in the emergency department. Patient verbalizes understanding and agreement with plan. All questions answered. Patient discharged in stable condition.  Final Clinical Impressions(s) / UC Diagnoses   Final diagnoses:  Left arm pain     Discharge Instructions      You have been evaluated in the today for your left arm pain. Your pain is most likely muscle strain which will improve on its own with time.   Take naproxen with food every to hours for the next 2-3 days consistently to treat pain and inflammation, then as needed. Do not take any other NSAID containing medicine when taking naproxen like ibuprofen, Advil, Aleve, motrin, aspirin, Voltaren, diclofenac, and other medicines. Take this medication  with a snack to avoid stomach upset.   Your first dose of naproxen when you get home may be at 8 PM since you were given ketorolac injection in the clinic today.  You may also take Zanaflex muscle relaxer.  Do not take this medication and drive or drink alcohol as it can make you sleepy.  Mainly use this medicine at nighttime as needed.  Apply heat and perform gentle range of motion exercises to the area of greatest pain to prevent muscle stiffness and provide further pain relief.   Call [facility] to schedule an appointment for follow-up.  Return to urgent care if your symptoms do not improve in the next 3 to 4 days with medications and interventions recommended today.  If you develop any new or worsening symptoms, please return to urgent care.  If your symptoms are severe, please go to the emergency room.  I hope you feel better!     ED Prescriptions     Medication Sig Dispense Auth. Provider   naproxen (NAPROSYN) 500 MG tablet Take 1 tablet (500 mg total) by mouth 2 (two) times daily. 30 tablet Talbot Grumbling, FNP   tiZANidine (ZANAFLEX) 4 MG tablet Take 1 tablet (4 mg total) by mouth every 6 (six) hours as needed for muscle spasms.  30 tablet Talbot Grumbling, FNP      PDMP not reviewed this encounter.   Talbot Grumbling, [place] 03/01/22 2201

## 2022-02-27 DIAGNOSIS — M5412 Radiculopathy, cervical region: Secondary | ICD-10-CM | POA: Diagnosis not present

## 2022-02-27 DIAGNOSIS — M25512 Pain in left shoulder: Secondary | ICD-10-CM | POA: Diagnosis not present

## 2022-03-12 ENCOUNTER — Ambulatory Visit (HOSPITAL_COMMUNITY)
Admission: EM | Admit: 2022-03-12 | Discharge: 2022-03-12 | Disposition: A | Discharge status: Home / Self Care | Payer: Medicaid Other | Attending: Internal Medicine | Admitting: Internal Medicine

## 2022-03-12 ENCOUNTER — Encounter (HOSPITAL_COMMUNITY): Payer: Self-pay | Admitting: *Deleted

## 2022-03-12 DIAGNOSIS — L02411 Cutaneous abscess of right axilla: Secondary | ICD-10-CM

## 2022-03-12 DIAGNOSIS — L732 Hidradenitis suppurativa: Secondary | ICD-10-CM | POA: Diagnosis not present

## 2022-03-12 DIAGNOSIS — L02412 Cutaneous abscess of left axilla: Secondary | ICD-10-CM

## 2022-03-12 MED ORDER — DOXYCYCLINE HYCLATE 100 MG PO CAPS: 100.0000 mg | ORAL_CAPSULE | Freq: Two times a day (BID) | ORAL | 0 refills | Status: AC

## 2022-03-12 MED ORDER — CLINDAMYCIN PALMITATE HCL 75 MG/5ML PO SOLR: ORAL | 0 refills | Status: DC

## 2022-03-12 NOTE — ED Triage Notes (Signed)
Pt state she has a boil under her right arm and one under left arm. They are painful.

## 2022-03-12 NOTE — ED Provider Notes (Addendum)
[place]    CSN: [id number] Arrival date & time: 03/12/22  3614      History   Chief Complaint Chief Complaint  Patient presents with   Abscess    HPI Ruth [name] is a 46 y.o. female.   Patient reports 2-week history of right arm pain and inflammation, under right axilla.  Also reports another lesion under her left axilla.  Denies any current drainage from either of these 2 sites.  Endorses history of recurrent "boils" in these locations as well as in her groin.  Denies fever or any other systemic symptoms.  Patient does not have a PCP.  Has never been treated by dermatology.  The history is provided by the patient.    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/16/2021   BMI 45.0-49.9, adult ([place]) 09/04/2020   Right arm pain 02/14/2020   Hyperlipidemia associated with type 2 diabetes mellitus ([place]) 09/19/2019   Abnormal uterine bleeding (AUB) 12/07/2017   Abnormal mammogram of both breasts 11/02/2016   Hypothyroidism 06/21/2009   Type 2 diabetes mellitus with peripheral neuropathy ([place]) 06/21/2009   BIPOLAR AFFECTIVE DISORDER 06/21/2009   PERSISTENT DISORDER INITIATING/MAINTAINING SLEEP 06/21/2009   INADEQUATE SLEEP HYGIENE 06/21/2009   OBSTRUCTIVE SLEEP APNEA 06/21/2009   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   CARPAL TUNNEL SYNDROME 06/20/2009   MERALGIA PARESTHETICA 06/20/2009   Essential hypertension 06/20/2009    FIBROMYALGIA 06/20/2009   CEPHALGIA 06/20/2009    Past Surgical History:  Procedure Laterality Date   ANTERIOR CERVICAL DECOMP/DISCECTOMY FUSION N/A 01/16/2021   Procedure: Cervical five-six Anterior cervical decompression/discectomy/fusion;  Surgeon: Consuella Lose, MD;  Location: [facility];  Service: Neurosurgery;  Laterality: N/A;   CARPAL TUNNEL RELEASE Right    CESAREAN SECTION     x2   DILITATION & CURRETTAGE/HYSTROSCOPY WITH NOVASURE ABLATION N/A 12/07/2017   Procedure: DILATATION & CURETTAGE/HYSTEROSCOPY WITH ATTEMPED NOVASURE ABLATION, FAILED/ENDOMETERIAL ABLATION WITH HTA SYSTEM;  Surgeon: Lavonia Drafts, MD;  [facility];  Service: Gynecology;  Laterality: N/A;   ELBOW SURGERY     "surgery for nerve pain"   GANGLION CYST EXCISION Right    right arm    RECTOVAGINAL FISTULA CLOSURE     after vag delivery   TONSILLECTOMY     childhood   TUBAL LIGATION      OB History     Gravida  7   Para  5   Term  4   Preterm  1   AB  2   Living  5      SAB  2   IAB      Ectopic      Multiple      Live Births  5  Obstetric Comments  Vag x 2>c/s emergency>vbac>c/s and BTL 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 Authorizing Provider  albuterol (VENTOLIN HFA) 108 (90 Base) MCG/ACT inhaler Inhale 1-2 puffs into the lungs every 6 (six) hours as needed for wheezing. 05/06/21  Yes Hazel Sams, PA-C  amLODipine (NORVASC) 5 MG tablet Take 1 tablet (5 mg total) by mouth daily. 04/25/20  Yes Nicolette Bang, MD  atorvastatin (LIPITOR) 80 MG tablet Take 1 tablet (80 mg total) by mouth daily at 6 PM. 04/25/20  Yes Nicolette Bang, MD  cetirizine (ZYRTEC) 10 MG tablet Take 1 tablet (10 mg total) by mouth daily. 01/17/21  Yes Consuella Lose, MD  clindamycin (CLEOCIN) 75 MG/5ML solution Apply to affected areas  twice daily. 03/12/22  Yes Shirel Mallis, Annie Main, FNP  diclofenac (CATAFLAM) 50 MG tablet Take 1 tablet (50 mg total) by mouth 2 (two) times daily as needed. 02/03/22  Yes Barrett Henle, MD  diphenhydrAMINE (BENADRYL) 50 MG tablet Take 1 tablet (50 mg total) by mouth daily as needed for itching. 01/17/21  Yes Consuella Lose, MD  doxycycline (VIBRAMYCIN) 100 MG capsule Take 1 capsule (100 mg total) by mouth 2 (two) times daily for 14 days. 03/12/22 03/26/22 Yes Dominique Calvey, Annie Main, FNP  fluticasone (FLONASE) 50 MCG/ACT nasal spray Place 1-2 sprays into both nostrils daily. 01/17/21  Yes Consuella Lose, MD  metroNIDAZOLE (FLAGYL) 500 MG tablet Take 1 tablet (500 mg total) by mouth 2 (two) times daily. 02/04/22  Yes Lamptey, Myrene Galas, MD  naproxen (NAPROSYN) 500 MG tablet Take 1 tablet (500 mg total) by mouth 2 (two) times daily. 02/26/22  Yes Talbot Grumbling, FNP  tiZANidine (ZANAFLEX) 4 MG tablet Take 1 tablet (4 mg total) by mouth every 6 (six) hours as needed for muscle spasms. 02/26/22  Yes Talbot Grumbling, FNP  albuterol (ACCUNEB) 0.63 MG/3ML nebulizer solution Take 3 mLs (0.63 mg total) by nebulization every 4 (four) hours as needed for wheezing or shortness of breath. Patient not taking: Reported on 10/29/2021 05/06/21   Hazel Sams, PA-C  glipiZIDE (GLUCOTROL) 10 MG tablet Take 1 tablet (10 mg total) by mouth daily before breakfast. Patient taking differently: Take 10 mg by mouth 2 (two) times daily as needed (high sugar). 09/29/21 10/29/21  Leath-Warren, Alda Lea, NP  insulin detemir (LEVEMIR) 100 UNIT/ML FlexPen Inject 38 Units into the skin 2 (two) times daily. Patient taking differently: Inject 43 Units into the skin 2 (two) times daily. 09/29/21 10/29/21  Leath-Warren, Alda Lea, NP  Insulin Pen Needle (PEN NEEDLES) 32G X 4 MM MISC 1 each by Does not apply route 2 (two) times daily. 09/29/21 10/29/21  Leath-Warren, Alda Lea, NP  potassium chloride (KLOR-CON) 10 MEQ tablet Take 1  tablet (10 mEq total) by mouth daily for 5 days. 12/26/21 02/26/22  Barrett Henle, MD    Family History Family History  Problem Relation Age of Onset   Thyroid disease Mother    Diabetes Daughter    Diabetes Son    Breast cancer Neg Hx     Social History Social History   Tobacco Use   Smoking status: Every Day    Packs/day: 0.25    Types: Cigarettes   Smokeless tobacco: Never  Vaping Use   Vaping Use: Never used  Substance Use Topics   Alcohol  use: Not Currently   Drug use: Not Currently    Types: Marijuana     Allergies   Metformin and related, Penicillins, and Victoza [liraglutide]   Review of Systems Review of Systems   Physical Exam Triage Vital Signs ED Triage Vitals  Enc Vitals Group     BP      Pulse      Resp      Temp      Temp src      SpO2      Weight      Height      Head Circumference      Peak Flow      Pain Score      Pain Loc      Pain Edu?      Excl. in [place]?    No data found.  Updated Vital Signs BP 132/85 (BP Location: Left Arm)   Pulse 79   Temp 97.7 F (36.5 C) (Oral)   Resp 18   SpO2 97%   Visual Acuity Right Eye Distance:   Left Eye Distance:   Bilateral Distance:    Right Eye Near:   Left Eye Near:    Bilateral Near:     Physical Exam Vitals reviewed.  Constitutional:      Appearance: Normal appearance.  Chest:    Skin:    General: Skin is warm and dry.     Findings: Lesion present.  Neurological:     General: No focal deficit present.     Mental Status: She is alert and oriented to person, place, and time.  Psychiatric:        Mood and Affect: Mood normal.        Behavior: Behavior normal.      UC Treatments / Results  Labs (all labs ordered are listed, but only abnormal results are displayed) Labs Reviewed - No data to display  EKG   Radiology No results found.  Procedures Procedures (including critical care time)  Medications Ordered in UC Medications - No data to  display  Initial Impression / Assessment and Plan / UC Course  I have reviewed the triage vital signs and the nursing notes.  Pertinent labs & imaging results that were available during my care of the patient were reviewed by me and considered in my medical decision making (see chart for details).   Noted recurrent hx of "boils" from 2014, as recently as 11/22/21 when she was seen in [place] with R groin lesion with drainage and 10/26/21 for L axilla lesion treated with course of doxycycline.  Exquisite tenderness to palpation under the R axilla. 3 cm annual area of induration without fluctuance and another smaller one under L axilla. Evidence of scarring which likely represents tunneling consistent with hidradenitis suppurativa, likely at least [facility] stage II and needing referral to dermatology for possibly surgical intervention.  Treating today with topical clindamycin.  Also treating with a 2-week course of doxycycline.  Reiterated to patient that she needs to be seen in dermatology for possible surgical intervention.  Provided resources to patient for establishing care with PCP.  Final Clinical Impressions(s) / UC Diagnoses   Final diagnoses:  Hidradenitis suppurativa  Abscess of left axilla  Abscess of axilla, right     Discharge Instructions      You were diagnosed today with a chronic skin condition called hidradenitis suppurativa.  We started treatment today with both a topical and oral antibiotic.  You should clean your skin  as directed on the topical antibiotic container and take the oral antibiotic as directed.  Please establish care with a primary care provider in order to obtain a referral to dermatology for treatment     ED Prescriptions     Medication Sig Dispense Auth. Provider   clindamycin (CLEOCIN) 75 MG/5ML solution Apply to affected areas twice daily. 100 mL Ryder Man, FNP   doxycycline (VIBRAMYCIN) 100 MG capsule Take 1 capsule (100 mg total) by mouth 2  (two) times daily for 14 days. 28 capsule Danilo Cappiello, FNP      PDMP not reviewed this encounter.   Rose Phi, [place] 03/12/22 [place]    Rose Phi, [place] 03/12/22 [phone]

## 2022-03-12 NOTE — Discharge Instructions (Addendum)
You were diagnosed today with a chronic skin condition called hidradenitis suppurativa.  We started treatment today with both a topical and oral antibiotic.  You should clean your skin as directed on the topical antibiotic container and take the oral antibiotic as directed.  Please establish care with a primary care provider in order to obtain a referral to dermatology for treatment

## 2022-03-13 ENCOUNTER — Encounter: Payer: Self-pay | Admitting: Family Medicine

## 2022-03-13 ENCOUNTER — Ambulatory Visit (INDEPENDENT_AMBULATORY_CARE_PROVIDER_SITE_OTHER): Payer: Medicaid Other | Admitting: Family Medicine

## 2022-03-13 ENCOUNTER — Ambulatory Visit: Payer: Medicaid Other | Admitting: Family

## 2022-03-13 ENCOUNTER — Telehealth (HOSPITAL_COMMUNITY): Payer: Self-pay | Admitting: Emergency Medicine

## 2022-03-13 VITALS — BP 125/86 | HR 80 | Temp 97.7°F | Resp 16 | Ht 66.0 in | Wt 283.6 lb

## 2022-03-13 DIAGNOSIS — E1169 Type 2 diabetes mellitus with other specified complication: Secondary | ICD-10-CM

## 2022-03-13 DIAGNOSIS — E785 Hyperlipidemia, unspecified: Secondary | ICD-10-CM | POA: Diagnosis not present

## 2022-03-13 DIAGNOSIS — E1142 Type 2 diabetes mellitus with diabetic polyneuropathy: Secondary | ICD-10-CM

## 2022-03-13 DIAGNOSIS — Z794 Long term (current) use of insulin: Secondary | ICD-10-CM | POA: Diagnosis not present

## 2022-03-13 DIAGNOSIS — L732 Hidradenitis suppurativa: Secondary | ICD-10-CM | POA: Diagnosis not present

## 2022-03-13 MED ORDER — CLINDAMYCIN PHOSPHATE 1 % EX GEL: Freq: Two times a day (BID) | CUTANEOUS | 0 refills | Status: DC

## 2022-03-13 MED ORDER — INSULIN DETEMIR 100 UNIT/ML FLEXPEN: 43.0000 [IU] | PEN_INJECTOR | Freq: Two times a day (BID) | SUBCUTANEOUS | 1 refills | Status: DC

## 2022-03-13 MED ORDER — GLIPIZIDE 10 MG PO TABS: 10.0000 mg | ORAL_TABLET | Freq: Two times a day (BID) | ORAL | 1 refills | Status: DC

## 2022-03-13 MED ORDER — ATORVASTATIN CALCIUM 80 MG PO TABS: 80.0000 mg | ORAL_TABLET | Freq: Every day | ORAL | 5 refills | Status: DC

## 2022-03-13 NOTE — Progress Notes (Unsigned)
Patient is her with boils under her arms. Patient said that she get this often. Patient has diabetes but it's unmanaged.Patient would like her cyst to be lanced today if possible.

## 2022-03-14 LAB — LIPID PANEL
Chol/HDL Ratio: 4.7 ratio — ABNORMAL HIGH (ref 0.0–4.4)
Cholesterol, Total: 227 mg/dL — ABNORMAL HIGH (ref 100–199)
HDL: 48 mg/dL (ref >39)
LDL Chol Calc (NIH): 160 mg/dL — ABNORMAL HIGH (ref 0–99)
Triglycerides: 106 mg/dL (ref 0–149)
VLDL Cholesterol Cal: 19 mg/dL (ref 5–40)

## 2022-03-14 LAB — CMP14+EGFR
ALT: 5 IU/L (ref 0–32)
AST: 8 IU/L (ref 0–40)
Albumin/Globulin Ratio: 1.4 (ref 1.2–2.2)
Albumin: 4.2 g/dL (ref 3.9–4.9)
Alkaline Phosphatase: 94 IU/L (ref 44–121)
BUN/Creatinine Ratio: 19 (ref 9–23)
BUN: 11 mg/dL (ref 6–24)
Bilirubin Total: <0.2 mg/dL (ref 0.0–1.2)
CO2: 23 mmol/L (ref 20–29)
Calcium: 9.3 mg/dL (ref 8.7–10.2)
Chloride: 99 mmol/L (ref 96–106)
Creatinine, Ser: 0.59 mg/dL (ref 0.57–1.00)
Globulin, Total: 3 g/dL (ref 1.5–4.5)
Glucose: 163 mg/dL — ABNORMAL HIGH (ref 70–99)
Potassium: 4.2 mmol/L (ref 3.5–5.2)
Sodium: 137 mmol/L (ref 134–144)
Total Protein: 7.2 g/dL (ref 6.0–8.5)
eGFR: 111 mL/min/{1.73_m2} (ref >59)

## 2022-03-14 LAB — HEMOGLOBIN A1C
Est. average glucose Bld gHb Est-mCnc: 203 mg/dL
Hgb A1c MFr Bld: 8.7 % — ABNORMAL HIGH (ref 4.8–5.6)

## 2022-03-16 ENCOUNTER — Ambulatory Visit: Payer: Medicaid Other | Attending: Family Medicine | Admitting: Pharmacist

## 2022-03-16 ENCOUNTER — Other Ambulatory Visit: Payer: Self-pay

## 2022-03-16 ENCOUNTER — Encounter: Payer: Self-pay | Admitting: Emergency Medicine

## 2022-03-16 DIAGNOSIS — E1142 Type 2 diabetes mellitus with diabetic polyneuropathy: Secondary | ICD-10-CM | POA: Diagnosis not present

## 2022-03-16 MED ORDER — DEXCOM G6 SENSOR MISC
1 refills | Status: DC
  Filled 2022-03-16: qty 3, 30d supply, fill #0

## 2022-03-16 MED ORDER — DEXCOM G6 TRANSMITTER MISC
1 refills | Status: DC
  Filled 2022-03-16: qty 1, 90d supply, fill #0

## 2022-03-16 MED ORDER — OZEMPIC (0.25 OR 0.5 MG/DOSE) 2 MG/3ML ~~LOC~~ SOPN
0.2500 mg | PEN_INJECTOR | SUBCUTANEOUS | 2 refills | Status: DC
  Filled 2022-03-16: qty 3, 42d supply, fill #0

## 2022-03-16 MED ORDER — DEXCOM G6 RECEIVER DEVI
0 refills | Status: DC
  Filled 2022-03-16: qty 1, 34d supply, fill #0

## 2022-03-16 MED ORDER — TRUEPLUS 5-BEVEL PEN NEEDLES 32G X 4 MM MISC
1 refills | Status: DC
  Filled 2022-03-16 (×2): qty 100, 30d supply, fill #0
  Filled 2022-03-17 – 2022-05-20 (×3): qty 100, 34d supply, fill #0

## 2022-03-16 NOTE — Progress Notes (Signed)
New Patient Office Visit  Subjective    Patient ID: Ruth [name], female    DOB: 1976/02/14  Age: 46 y.o. MRN: [id number]  CC:  Chief Complaint  Patient presents with   Blister    HPI Ruth [name] presents to establish care and for review of chronic med issues. Patient also reports that she was seen recently at [facility] and dx with HS and put on doxy which she is continuing as recommended.    Outpatient Encounter Medications as of 03/13/2022  Medication Sig   naproxen (NAPROSYN) 500 MG tablet Take 1 tablet (500 mg total) by mouth 2 (two) times daily.   tiZANidine (ZANAFLEX) 4 MG tablet Take 1 tablet (4 mg total) by mouth every 6 (six) hours as needed for muscle spasms.   albuterol (ACCUNEB) 0.63 MG/3ML nebulizer solution Take 3 mLs (0.63 mg total) by nebulization every 4 (four) hours as needed for wheezing or shortness of breath. (Patient not taking: Reported on 03/13/2022)   albuterol (VENTOLIN HFA) 108 (90 Base) MCG/ACT inhaler Inhale 1-2 puffs into the lungs every 6 (six) hours as needed for wheezing. (Patient not taking: Reported on 03/13/2022)   amLODipine (NORVASC) 5 MG tablet Take 1 tablet (5 mg total) by mouth daily. (Patient not taking: Reported on 03/13/2022)   atorvastatin (LIPITOR) 80 MG tablet Take 1 tablet (80 mg total) by mouth daily at 6 PM.   cetirizine (ZYRTEC) 10 MG tablet Take 1 tablet (10 mg total) by mouth daily. (Patient not taking: Reported on 03/13/2022)   clindamycin (CLEOCIN) 75 MG/5ML solution Apply to affected areas twice daily. (Patient not taking: Reported on 03/13/2022)   diclofenac (CATAFLAM) 50 MG tablet Take 1 tablet (50 mg total) by mouth 2 (two) times daily as needed. (Patient not taking: Reported on 03/13/2022)   diphenhydrAMINE (BENADRYL) 50 MG tablet Take 1 tablet (50 mg total) by mouth daily as needed for itching. (Patient not taking: Reported on 03/13/2022)   doxycycline (VIBRAMYCIN) 100 MG capsule Take 1 capsule (100 mg total) by mouth 2  (two) times daily for 14 days. (Patient not taking: Reported on 03/13/2022)   fluticasone (FLONASE) 50 MCG/ACT nasal spray Place 1-2 sprays into both nostrils daily. (Patient not taking: Reported on 03/13/2022)   glipiZIDE (GLUCOTROL) 10 MG tablet Take 1 tablet (10 mg total) by mouth 2 (two) times daily before a meal.   insulin detemir (LEVEMIR) 100 UNIT/ML FlexPen Inject 43 Units into the skin 2 (two) times daily.   Insulin Pen Needle (PEN NEEDLES) 32G X 4 MM MISC 1 each by Does not apply route 2 (two) times daily.   metroNIDAZOLE (FLAGYL) 500 MG tablet Take 1 tablet (500 mg total) by mouth 2 (two) times daily. (Patient not taking: Reported on 03/13/2022)   potassium chloride (KLOR-CON) 10 MEQ tablet Take 1 tablet (10 mEq total) by mouth daily for 5 days.   [DISCONTINUED] atorvastatin (LIPITOR) 80 MG tablet Take 1 tablet (80 mg total) by mouth daily at 6 PM. (Patient not taking: Reported on 03/13/2022)   [DISCONTINUED] glipiZIDE (GLUCOTROL) 10 MG tablet Take 1 tablet (10 mg total) by mouth daily before breakfast. (Patient taking differently: Take 10 mg by mouth 2 (two) times daily as needed (high sugar).)   [DISCONTINUED] insulin detemir (LEVEMIR) 100 UNIT/ML FlexPen Inject 38 Units into the skin 2 (two) times daily. (Patient taking differently: Inject 43 Units into the skin 2 (two) times daily.)   No facility-administered encounter medications on file as of 03/13/2022.  Past Medical History:  Diagnosis Date   Anemia    Anxiety    Arthritis    chronic back pain    Asthma    rarely uses inhaler   Bipolar 1 disorder (HCC)    Fibromyalgia    GERD (gastroesophageal reflux disease)    Headache    otc med   HLD (hyperlipidemia)    diet controlled   Hx of migraines    occ   Hypertension    Hypothyroidism    Obesity    Sleep apnea    does not use  cpap   Thrombocytosis 01/26/2020   Type 2 diabetes mellitus ([place])    Type 2    Past Surgical History:  Procedure Laterality Date   ANTERIOR  CERVICAL DECOMP/DISCECTOMY FUSION N/A 01/16/2021   Procedure: Cervical five-six Anterior cervical decompression/discectomy/fusion;  Surgeon: Consuella Lose, MD;  Location: [facility];  Service: Neurosurgery;  Laterality: N/A;   CARPAL TUNNEL RELEASE Right    CESAREAN SECTION     x2   DILITATION & CURRETTAGE/HYSTROSCOPY WITH NOVASURE ABLATION N/A 12/07/2017   Procedure: DILATATION & CURETTAGE/HYSTEROSCOPY WITH ATTEMPED NOVASURE ABLATION, FAILED/ENDOMETERIAL ABLATION WITH HTA SYSTEM;  Surgeon: Lavonia Drafts, MD;  Location: [facility];  Service: Gynecology;  Laterality: N/A;   ELBOW SURGERY     "surgery for nerve pain"   GANGLION CYST EXCISION Right    right arm    RECTOVAGINAL FISTULA CLOSURE     after vag delivery   TONSILLECTOMY     childhood   TUBAL LIGATION      Family History  Problem Relation Age of Onset   Thyroid disease Mother    Diabetes Daughter    Diabetes Son    Breast cancer Neg Hx     Social History   Socioeconomic History   Marital status: Married    Spouse name: Not on file   Number of children: Not on file   Years of education: Not on file   Highest education level: Not on file  Occupational History   Not on file  Tobacco Use   Smoking status: Every Day    Packs/day: 0.25    Types: Cigarettes   Smokeless tobacco: Never  Vaping Use   Vaping Use: Never used  Substance and Sexual Activity   Alcohol use: Not Currently   Drug use: Not Currently    Types: Marijuana   Sexual activity: Not Currently    Birth control/protection: Surgical    Comment: tubal ligation  Other Topics Concern   Not on file  Social History Narrative   Not on file   Social Determinants of Health   Financial Resource Strain: Not on file  Food Insecurity: Not on file  Transportation Needs: Not on file  Physical Activity: Not on file  Stress: Not on file  Social Connections: Not on file  Intimate Partner Violence: Not on file    Review of Systems  All other systems  reviewed and are negative.       Objective    BP 125/86   Pulse 80   Temp 97.7 F (36.5 C) (Oral)   Resp 16   Ht _0  (1.676 m)   Wt 283 lb 9.6 oz (128.6 kg)   SpO2 93%   BMI 45.77 kg/m   Physical Exam Vitals and nursing note reviewed.  Constitutional:      General: She is not in acute distress. Cardiovascular:     Rate and Rhythm: Normal rate and regular rhythm.  Pulmonary:  Effort: Pulmonary effort is normal.     Breath sounds: Normal breath sounds.  Abdominal:     Palpations: Abdomen is soft.     Tenderness: There is no abdominal tenderness.  Skin:    Comments: Draining abscess lesions noted in axilla.   Neurological:     General: No focal deficit present.     Mental Status: She is alert and oriented to person, place, and time.         Assessment & Plan:   1. Hidradenitis axillaris Complete course of doxy. Consider referral to specialist for further eval/mgt if sx recur.   2. Type 2 diabetes mellitus with other specified complication, without long-term current use of insulin (HCC) Most recent A1c is Improved but not at goal. Continue and monitor. Referral to [facility] for further eval/mgt - HgB A1c  3. Hyperlipidemia associated with type 2 diabetes mellitus ([place]) Meds refilled and labs ordered - atorvastatin (LIPITOR) 80 MG tablet; Take 1 tablet (80 mg total) by mouth daily at 6 PM.  Dispense: 30 tablet; Refill: 5 - CMP14+EGFR - Lipid panel    Return in about 4 months (around 07/13/2022) for follow up, chronic med issues.   Becky Sax, MD

## 2022-03-16 NOTE — Progress Notes (Signed)
.      S:    No chief complaint on file.  Ruth [name] is a 46 y.o. female who presents for diabetes evaluation, education, and management. PMH is significant for T2DM and HLD. Patient was referred and last seen by Primary Care Provider, Dr. Redmond Pulling, on 03/13/2022. At last visit, A1c was 8.7% and glipizide was increased from 10 mg daily to BID.   Today, patient arrives in good spirits and presents without any assistance. Reports she has not taken insulin in many months (last filled April 2023 per chart review). She has not been taking glipizide either. She endorses having severe needle phobia, but is willing to give Ozempic a try.   Patient reports Diabetes was diagnosed in 2009-2010.   Family/Social History:  DM- daughter, brother, sister  No family or personal history of thyroid cancer (mother has history of hyperthyroidism requiring radiation).   Current diabetes medications include: none. Has not started glipizide and has not filled Levemir in many months.  Patient denies adherence to all medications.  Insurance coverage: Medicaid, [employer]  Reported home fasting blood sugars: not checking regularly. Does once in a while, last seen 190 mg/dL. None >200.  Patient reports nocturia (nighttime urination), depends on if she takes her BP meds at night.  Patient denies neuropathy (nerve pain). Patient reports visual changes. Patient reports self foot exams.   O:  7 day average blood glucose: not checking   Lab Results  Component Value Date   HGBA1C 8.7 (H) 03/13/2022   Lipid Panel     Component Value Date/Time   CHOL 227 (H) 03/13/2022 1020   TRIG 106 03/13/2022 1020   HDL 48 03/13/2022 1020   CHOLHDL 4.7 (H) 03/13/2022 1020   LDLCALC 160 (H) 03/13/2022 1020    Clinical Atherosclerotic Cardiovascular Disease (ASCVD): No  The 10-year ASCVD risk score (Arnett DK, et al., 2019) is: 16.5%   Values used to calculate the score:     Age: 48 years     Sex: Female     Is  Non-Hispanic African American: Yes     Diabetic: Yes     Tobacco smoker: Yes     Systolic Blood Pressure: 465 mmHg     Is BP treated: Yes     HDL Cholesterol: 48 mg/dL     Total Cholesterol: 227 mg/dL   Patient is participating in a Managed Medicaid Plan:  Yes   A/P: Diabetes longstanding currently close to goal based on A1c. Patient is able to verbalize appropriate hypoglycemia management plan. Medication adherence need improvement. Control is suboptimal due to adherence and needle phobia. -Discontinued Levemir 43 units BID as patient's A1c is <10% while not being on insulin for many months.  -Discontinued glipizide 10 mg BID. -Started GLP-1 Ozempic (semaglutide) 0.25 mg weekly.  -Patient educated on purpose, proper use, and potential adverse effects of Ozempic.  -Extensively discussed pathophysiology of diabetes, recommended lifestyle interventions, dietary effects on blood sugar control.  -Counseled on s/sx of and management of hypoglycemia.  -Next A1c anticipated in 3 months.   Follow-up:  Pharmacist 1 month. PCP clinic visit in January 2024.   Joseph Art, Pharm.D. PGY-2 Ambulatory Care Pharmacy Resident 03/16/2022 2:58 PM

## 2022-03-17 ENCOUNTER — Other Ambulatory Visit: Payer: Self-pay

## 2022-03-18 DIAGNOSIS — J111 Influenza due to unidentified influenza virus with other respiratory manifestations: Secondary | ICD-10-CM | POA: Diagnosis not present

## 2022-03-20 DIAGNOSIS — M542 Cervicalgia: Secondary | ICD-10-CM | POA: Diagnosis not present

## 2022-03-23 DIAGNOSIS — M542 Cervicalgia: Secondary | ICD-10-CM | POA: Diagnosis not present

## 2022-04-02 DIAGNOSIS — M5000 Cervical disc disorder with myelopathy, unspecified cervical region: Secondary | ICD-10-CM | POA: Diagnosis not present

## 2022-04-08 ENCOUNTER — Other Ambulatory Visit: Payer: Self-pay | Admitting: Pharmacist

## 2022-04-08 ENCOUNTER — Other Ambulatory Visit: Payer: Self-pay

## 2022-04-08 MED ORDER — DEXCOM G7 RECEIVER DEVI
0 refills | Status: DC
  Filled 2022-04-08 – 2022-04-13 (×3): qty 1, 34d supply, fill #0

## 2022-04-08 MED ORDER — DEXCOM G7 SENSOR MISC
2 refills | Status: DC
  Filled 2022-04-08: qty 3, 30d supply, fill #0
  Filled 2022-05-07: qty 3, 30d supply, fill #1
  Filled 2022-06-02: qty 3, 30d supply, fill #2

## 2022-04-11 IMAGING — CT CT HEAD W/O CM
4 series · 17 of 47 positions shown, 19 images · non-contrast
Comparison: Remote head CT 07/22/2010

CLINICAL DATA: Headache, chronic, new features or increased
frequency

EXAM:
CT HEAD WITHOUT CONTRAST
TECHNIQUE: Contiguous axial images were obtained from the base of the skull
through the vertex without intravenous contrast.

[Series 3: head without · axial · non-contrast · 0.43mm/px · z∈[-153,-33]mm · 7 of 33 slices shown, 9 images]
[im 5/33  brain]
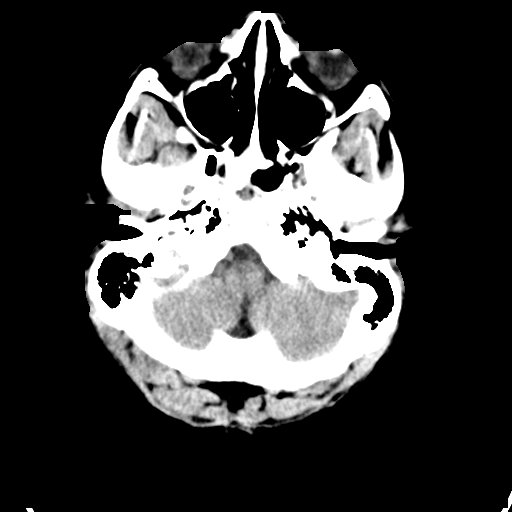
[im 5/33  bone]
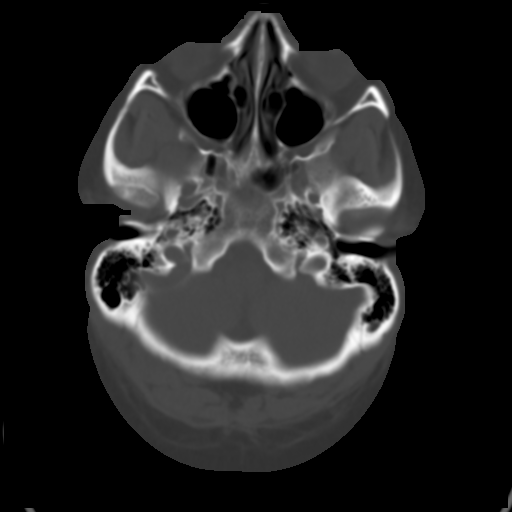
[im 9/33  brain]
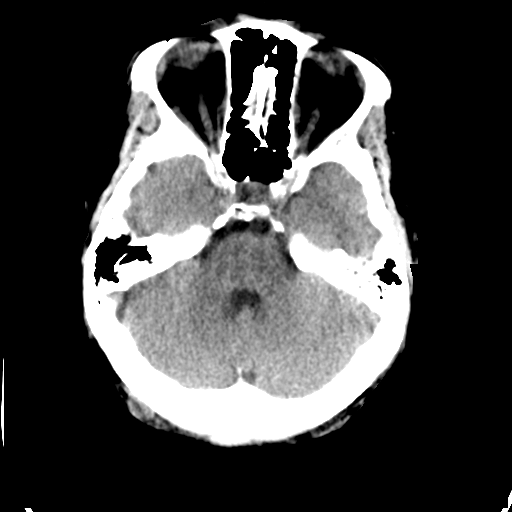
[im 13/33  brain]
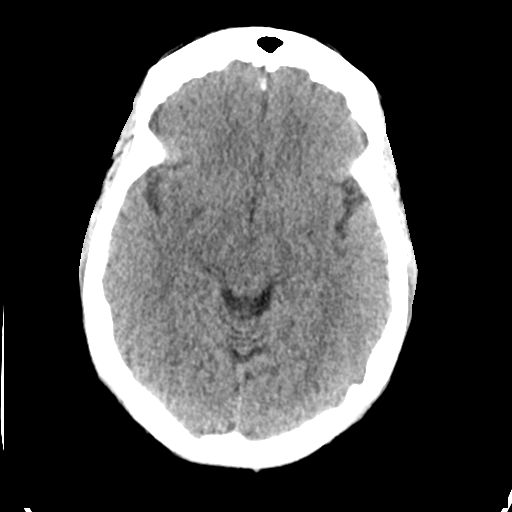
[im 17/33  brain]
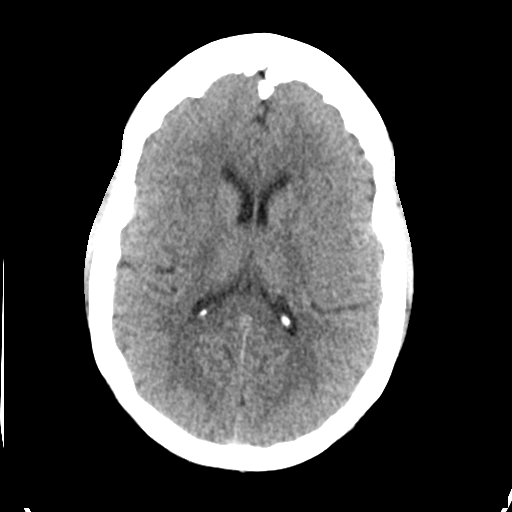
[im 21/33  brain]
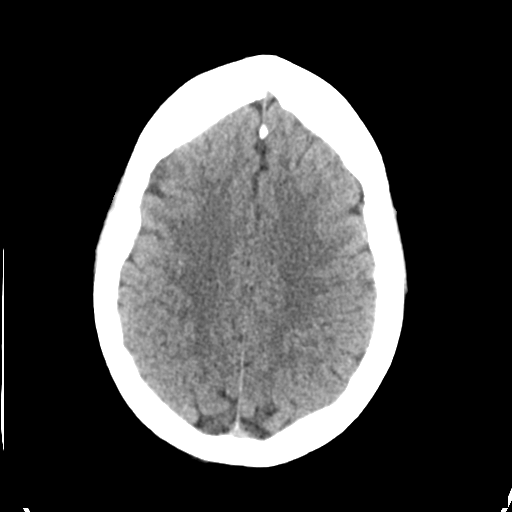
[im 21/33  bone]
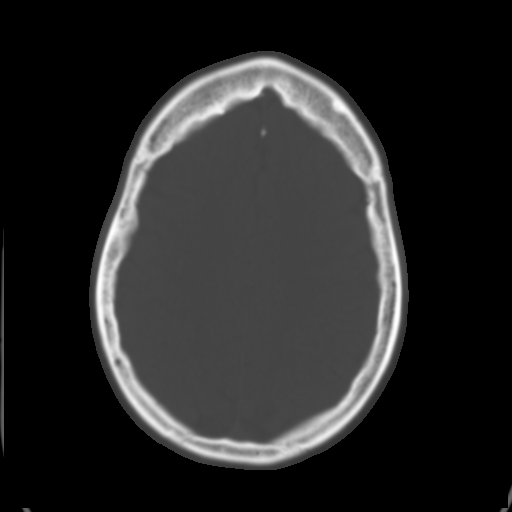
[im 25/33  brain]
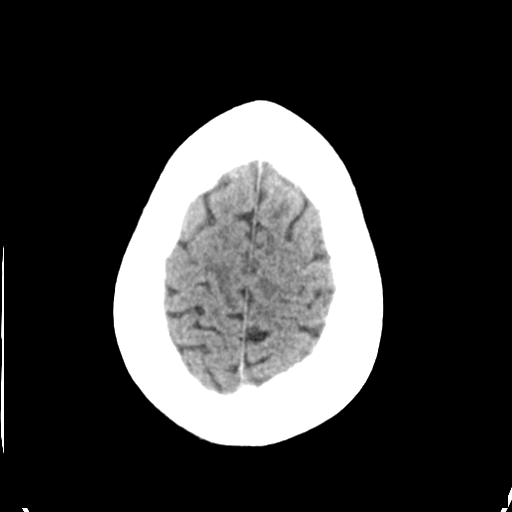
[im 29/33  brain]
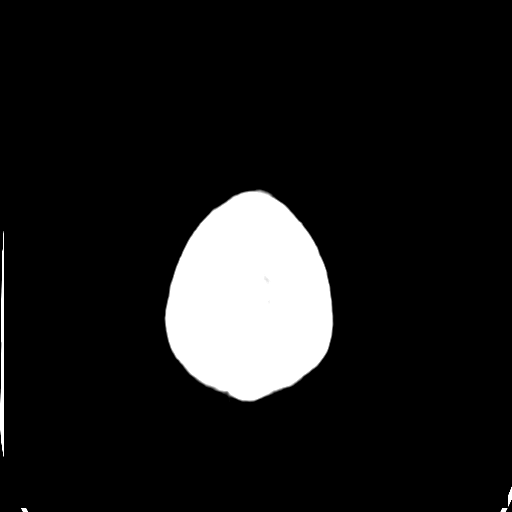

[Series 4: head bone · axial · 0.43mm/px · z∈[-157,-101]mm · 4 of 82 slices shown]
[im 9/82  bone]
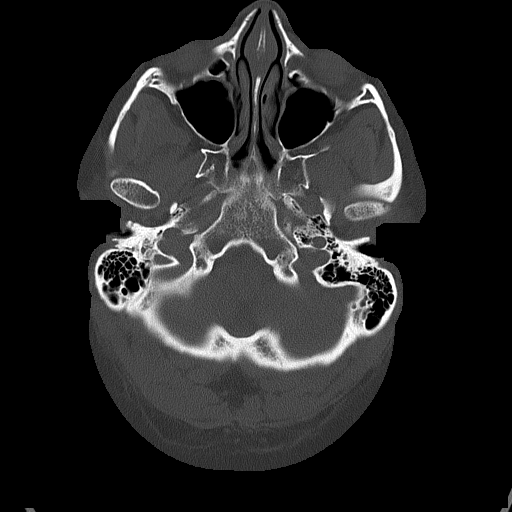
[im 17/82  bone]
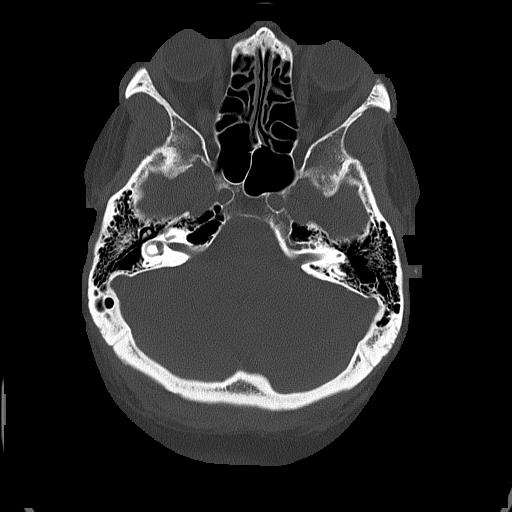
[im 25/82  bone]
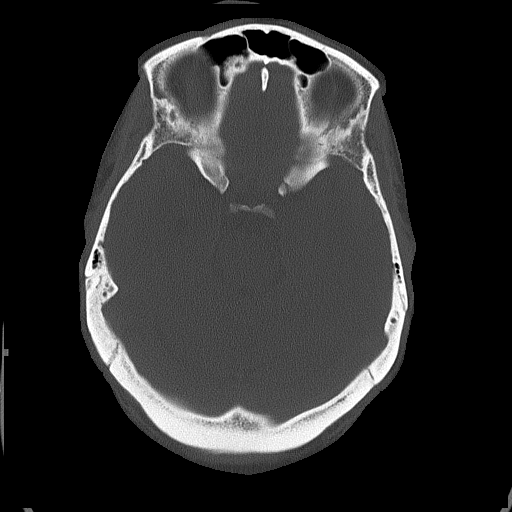
[im 37/82  bone]
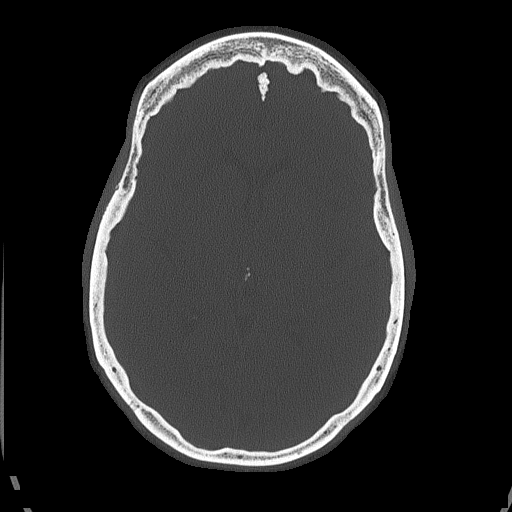

[Series 5: head without cor · coronal · non-contrast · 0.32mm/px · 3 of 66 slices shown]
[im 22/66  brain]
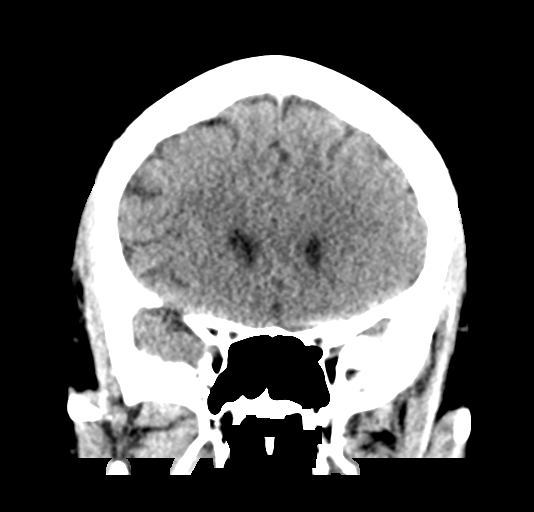
[im 29/66  brain]
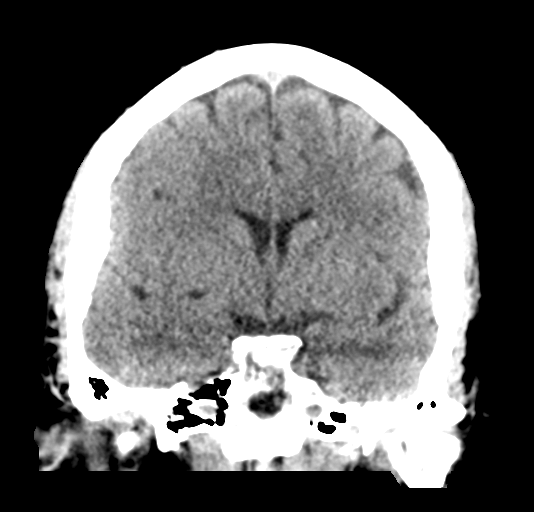
[im 37/66  brain]
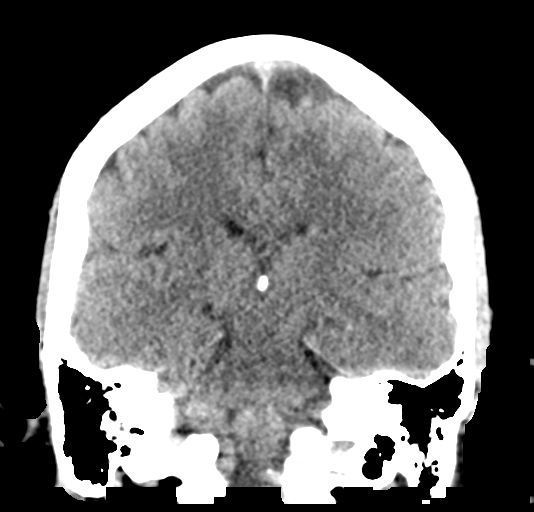

[Series 6: head without sag · sagittal · non-contrast · 0.32mm/px · 3 of 55 slices shown]
[im 19/55  brain]
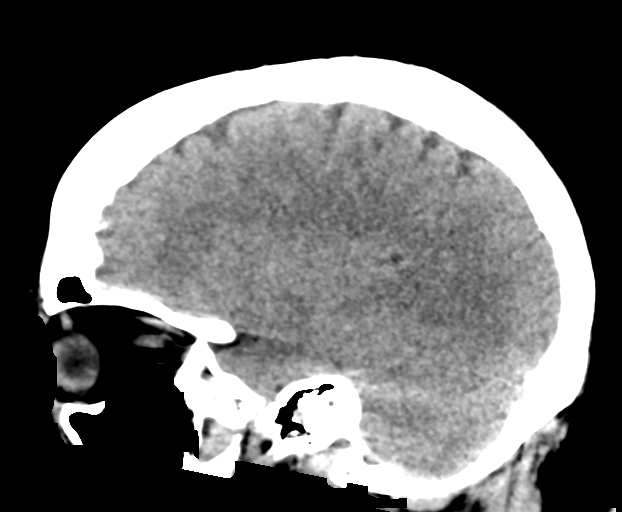
[im 28/55  brain]
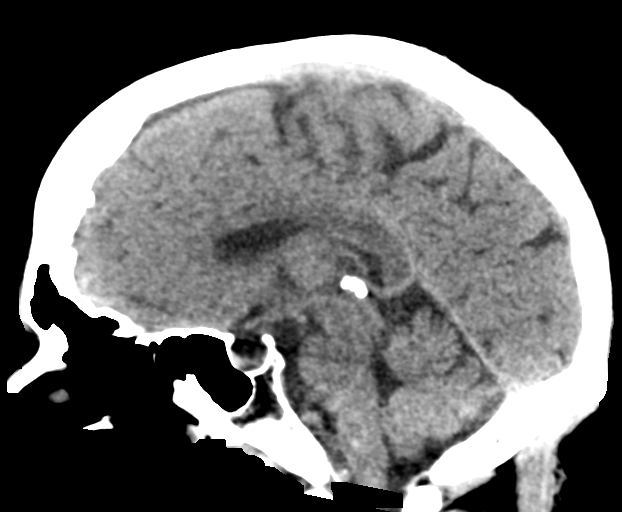
[im 37/55  brain]
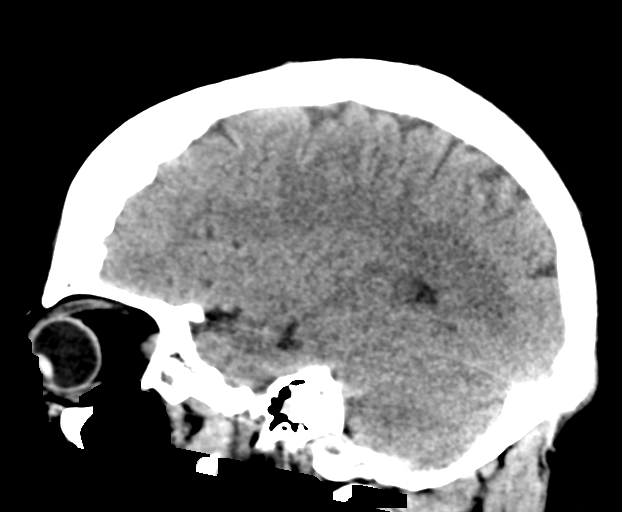

[17 of 47 positions shown; findings below may reference images not displayed]

FINDINGS: Brain: No evidence of acute infarction, hemorrhage, hydrocephalus,
extra-axial collection or mass lesion/mass effect.

Vascular: No hyperdense vessel or unexpected calcification.

Skull: No fracture or focal lesion.

Sinuses/Orbits: Paranasal sinuses and mastoid air cells are clear.
The visualized orbits are unremarkable.

Other: None.
IMPRESSION: Negative noncontrast head CT.

## 2022-04-13 ENCOUNTER — Other Ambulatory Visit: Payer: Self-pay

## 2022-04-14 ENCOUNTER — Other Ambulatory Visit: Payer: Self-pay

## 2022-04-16 ENCOUNTER — Ambulatory Visit: Payer: Self-pay

## 2022-04-16 ENCOUNTER — Other Ambulatory Visit: Payer: Self-pay

## 2022-04-16 ENCOUNTER — Encounter (HOSPITAL_COMMUNITY): Payer: Self-pay | Admitting: Emergency Medicine

## 2022-04-16 ENCOUNTER — Ambulatory Visit (HOSPITAL_COMMUNITY)
Admission: EM | Admit: 2022-04-16 | Discharge: 2022-04-16 | Disposition: A | Discharge status: Home / Self Care | Payer: Medicaid Other | Attending: Sports Medicine | Admitting: Sports Medicine

## 2022-04-16 DIAGNOSIS — R42 Dizziness and giddiness: Secondary | ICD-10-CM | POA: Diagnosis not present

## 2022-04-16 LAB — CBC WITH DIFFERENTIAL/PLATELET
Abs Immature Granulocytes: 0.02 10*3/uL (ref 0.00–0.07)
Basophils Absolute: 0 10*3/uL (ref 0.0–0.1)
Basophils Relative: 0 %
Eosinophils Absolute: 0.1 10*3/uL (ref 0.0–0.5)
Eosinophils Relative: 1 %
HCT: 32.1 % — ABNORMAL LOW (ref 36.0–46.0)
Hemoglobin: 9.8 g/dL — ABNORMAL LOW (ref 12.0–15.0)
Immature Granulocytes: 0 %
Lymphocytes Relative: 29 %
Lymphs Abs: 1.6 10*3/uL (ref 0.7–4.0)
MCH: 22.7 pg — ABNORMAL LOW (ref 26.0–34.0)
MCHC: 30.5 g/dL (ref 30.0–36.0)
MCV: 74.5 fL — ABNORMAL LOW (ref 80.0–100.0)
Monocytes Absolute: 0.3 10*3/uL (ref 0.1–1.0)
Monocytes Relative: 5 %
Neutro Abs: 3.7 10*3/uL (ref 1.7–7.7)
Neutrophils Relative %: 65 %
Platelets: 506 10*3/uL — ABNORMAL HIGH (ref 150–400)
RBC: 4.31 MIL/uL (ref 3.87–5.11)
RDW: 16.4 % — ABNORMAL HIGH (ref 11.5–15.5)
WBC: 5.6 10*3/uL (ref 4.0–10.5)
nRBC: 0 % (ref 0.0–0.2)

## 2022-04-16 LAB — BASIC METABOLIC PANEL
Anion gap: 7 (ref 5–15)
BUN: 8 mg/dL (ref 6–20)
CO2: 22 mmol/L (ref 22–32)
Calcium: 8.8 mg/dL — ABNORMAL LOW (ref 8.9–10.3)
Chloride: 106 mmol/L (ref 98–111)
Creatinine, Ser: 0.74 mg/dL (ref 0.44–1.00)
GFR, Estimated: >60 mL/min (ref >60)
Glucose, Bld: 132 mg/dL — ABNORMAL HIGH (ref 70–99)
Potassium: 3.9 mmol/L (ref 3.5–5.1)
Sodium: 135 mmol/L (ref 135–145)

## 2022-04-16 LAB — POCT URINALYSIS DIPSTICK, ED / UC
Bilirubin Urine: NEGATIVE
Glucose, UA: NEGATIVE mg/dL
Hgb urine dipstick: NEGATIVE
Leukocytes,Ua: NEGATIVE
Nitrite: NEGATIVE
Protein, ur: NEGATIVE mg/dL
Specific Gravity, Urine: 1.025 (ref 1.005–1.030)
Urobilinogen, UA: 0.2 mg/dL (ref 0.0–1.0)
pH: 5.5 (ref 5.0–8.0)

## 2022-04-16 NOTE — Telephone Encounter (Signed)
FYI

## 2022-04-16 NOTE — Discharge Instructions (Signed)
You were seen here today for lightheadedness.  We collected some labs.  Urgent care staff will call you if there are any abnormal results.  I would recommend continuing your work-up with your primary care provider.  Make sure that you are drinking enough water and eating regularly.  If you have another episode of syncope I recommend evaluation at the ER.

## 2022-04-16 NOTE — Telephone Encounter (Signed)
  Chief Complaint: Dizziness, passed out, HA Symptoms: ibid Frequency: Tuesday Pertinent Negatives: Patient denies SOB, chest pain Disposition: '[x]'$ ED /'[]'$ Urgent Care (no appt availability in office) / '[]'$ Appointment(In office/virtual)/ '[]'$  [facility]/ '[]'$ Home Care/ '[]'$ Refused Recommended Disposition /'[]'$ [facility]/ '[]'$  Follow-up with PCP Additional Notes: PT passed out in her car on Tuesday while in her work parking lot heading home. PT went home and slept the the rest of the day. PT works early morning shift.  Yesterday pt, was at work and started to feel dizzy. She was sent home and again slept the rest of the day. Pt is ok at the moment. Pt is diabetic and blood glucose levels discussed seemed fine. PT did note that PT has not had much of an appetite. Pt did not go to work because she was feeling poorly.    Reason for Disposition  Patient sounds very sick or weak to the triager  Answer Assessment - Initial Assessment Questions 1. ONSET: "How long were you unconscious?" (minutes) "When did it happen?"     In car 5:15 - 6:30 passed out. Tuesday 2. CONTENT: "What happened during period of unconsciousness?" (e.g., seizure activity)      Sleeping hard 3. MENTAL STATUS: "Alert and oriented now?" (oriented x 3 = name, month, location)      yes 4. TRIGGER: "What do you think caused the fainting?" "What were you doing just before you fainted?"  (e.g., exercise, sudden standing up, prolonged standing)     unsure 5. RECURRENT SYMPTOM: "Have you ever passed out before?" If Yes, ask: "When was the last time?" and "What happened that time?"      no 6. INJURY: "Did you sustain any injury during the fall?"      no 7. CARDIAC SYMPTOMS: "Have you had any of the following symptoms: chest pain, difficulty breathing, palpitations?"     Can feel her heart beat, in her head and eyes 8. NEUROLOGIC SYMPTOMS: "Have you had any of the following symptoms: headache, numbness, vertigo,  weakness?"     Dizziness - HA for 1 week 9. GI SYMPTOMS: "Have you had any of the following symptoms: abdomen pain, vomiting, diarrhea, blood in stools?"     Nausea 10. OTHER SYMPTOMS: "Do you have any other symptoms?"       Not eating 11. PREGNANCY: "Is there any chance you are pregnant?" "When was your last menstrual period?"  Protocols used: Fainting-A-AH

## 2022-04-16 NOTE — ED Provider Notes (Signed)
Ruth [name]    CSN: [id number] Arrival date & time: 04/16/22  6967      History   Chief Complaint Chief Complaint  Patient presents with   Headache    HPI Ruth [name] is a 46 y.o. female.   Ruth [name] presents today with chief complaint of dizziness, fullness in her head and neck.  She reports that on Tuesday she was feeling dizzy and got sent home from work.  She passed out in her car after she had started it however woke up to colleagues knocking on the window and drove home.  She slept most of the day Tuesday and yesterday secondary to her dizziness.  She checked her blood sugar when she was feeling lightheaded at work on Tuesday and it was 140.  Her lightheadedness is described as a presyncopal feeling, she denies the room spinning around her.  She has had headaches in the past, she has been seen at this urgent care for headaches with lightheadedness in the past.  She chronically has a low hemoglobin around 9-10.  She reports heavy periods but has not been counseled on oral contraceptives to help with increased bleeding.  She denies any nausea, vomiting, chest pain, palpitations, blurry vision, thunderclap worst headache of her life symptoms.  She discussed the symptoms with her primary care provider who recommended she be evaluated in the ER.  Patient went to the ER this morning however the wait was very long so she presented here to urgent care.   Headache   Past Medical History:  Diagnosis Date   Anemia    Anxiety    Arthritis    chronic back pain    Asthma    rarely uses inhaler   Bipolar 1 disorder ([place])    Fibromyalgia    GERD (gastroesophageal reflux disease)    Headache    otc med   HLD (hyperlipidemia)    diet controlled   Hx of migraines    occ   Hypertension    Hypothyroidism    Obesity    Sleep apnea    does not use  cpap   Thrombocytosis 01/26/2020   Type 2 diabetes mellitus ([place])    Type 2    Patient Active Problem List    Diagnosis Date Noted   Stenosis of cervical spine with myelopathy ([place]) 01/16/2021   BMI 45.0-49.9, adult ([place]) 09/04/2020   Right arm pain 02/14/2020   Hyperlipidemia associated with type 2 diabetes mellitus ([place]) 09/19/2019   Abnormal uterine bleeding (AUB) 12/07/2017   Abnormal mammogram of both breasts 11/02/2016   Hypothyroidism 06/21/2009   Type 2 diabetes mellitus with peripheral neuropathy ([place]) 06/21/2009   BIPOLAR AFFECTIVE DISORDER 06/21/2009   PERSISTENT DISORDER INITIATING/MAINTAINING SLEEP 06/21/2009   INADEQUATE SLEEP HYGIENE 06/21/2009   OBSTRUCTIVE SLEEP APNEA 06/21/2009   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   CARPAL TUNNEL SYNDROME 06/20/2009   MERALGIA PARESTHETICA 06/20/2009   Essential hypertension 06/20/2009   FIBROMYALGIA 06/20/2009   CEPHALGIA 06/20/2009    Past Surgical History:  Procedure Laterality Date   ANTERIOR CERVICAL DECOMP/DISCECTOMY FUSION N/A 01/16/2021   Procedure: Cervical five-six Anterior cervical decompression/discectomy/fusion;  Surgeon: Consuella Lose, MD;  Location: [facility];  Service: Neurosurgery;  Laterality: N/A;   CARPAL TUNNEL RELEASE Right    CESAREAN SECTION     x2   DILITATION & CURRETTAGE/HYSTROSCOPY WITH NOVASURE ABLATION N/A 12/07/2017   Procedure: DILATATION & CURETTAGE/HYSTEROSCOPY WITH ATTEMPED NOVASURE ABLATION, FAILED/ENDOMETERIAL ABLATION WITH HTA SYSTEM;  Surgeon: Lavonia Drafts, MD;  Location:  [facility];  Service: Gynecology;  Laterality: N/A;   ELBOW SURGERY     "surgery for nerve pain"   GANGLION CYST EXCISION Right    right arm    RECTOVAGINAL FISTULA CLOSURE     after vag delivery   TONSILLECTOMY     childhood   TUBAL LIGATION      OB History     Gravida  7   Para  5   Term  4   Preterm  1   AB  2   Living  5      SAB  2   IAB      Ectopic      Multiple      Live Births  5        Obstetric Comments  Vag x 2>c/s emergency>vbac>c/s and BTL H/o RV fistula and then repaired after vag  delivery          Home Medications    Prior to Admission medications   Medication Sig Start Date End Date Taking? Authorizing Provider  Continuous Blood Gluc Receiver (DEXCOM G7 RECEIVER) DEVI Use to check blood sugar three times a day . 04/08/22   Charlott Rakes, MD  Continuous Blood Gluc Sensor (DEXCOM G7 SENSOR) MISC Use to check blood sugar three time daily. Change sensors once every 10 days. 04/08/22   Charlott Rakes, MD  amLODipine (NORVASC) 5 MG tablet Take 1 tablet (5 mg total) by mouth daily. Patient not taking: Reported on 03/13/2022 04/25/20   Nicolette Bang, MD  atorvastatin (LIPITOR) 80 MG tablet Take 1 tablet (80 mg total) by mouth daily at 6 PM. 03/13/22   Dorna Mai, MD  clindamycin (CLINDAGEL) 1 % gel Apply topically 2 (two) times daily. 03/13/22   Raspet, Junie Panning K, PA-C  Continuous Blood Gluc Transmit (DEXCOM G6 TRANSMITTER) MISC Use to check blood sugar three times daily. 03/16/22   Charlott Rakes, MD  Insulin Pen Needle (TRUEPLUS 5-BEVEL PEN NEEDLES) 32G X 4 MM MISC Use to inject Ozempic once a week. 03/16/22   Charlott Rakes, MD  naproxen (NAPROSYN) 500 MG tablet Take 1 tablet (500 mg total) by mouth 2 (two) times daily. 02/26/22   Talbot Grumbling, FNP  potassium chloride (KLOR-CON) 10 MEQ tablet Take 1 tablet (10 mEq total) by mouth daily for 5 days. 12/26/21 02/26/22  Barrett Henle, MD  Semaglutide,0.25 or 0.'5MG'$ /DOS, (OZEMPIC, 0.25 OR 0.5 MG/DOSE,) 2 MG/3ML SOPN Inject 0.25 mg into the skin once a week. For 4 weeks. Then, increase to 0.5 mg subq once weekly thereafter. 03/16/22   Charlott Rakes, MD    Family History Family History  Problem Relation Age of Onset   Thyroid disease Mother    Diabetes Daughter    Diabetes Son    Breast cancer Neg Hx     Social History Social History   Tobacco Use   Smoking status: Every Day    Packs/day: 0.25    Types: Cigarettes   Smokeless tobacco: Never  Vaping Use   Vaping Use: Never used  Substance Use  Topics   Alcohol use: Not Currently   Drug use: Not Currently    Types: Marijuana     Allergies   Metformin and related, Penicillins, and Victoza [liraglutide]   Review of Systems Review of Systems  Neurological:  Positive for headaches.  As listed above in HPI   Physical Exam Triage Vital Signs ED Triage Vitals  Enc Vitals Group     BP  Pulse      Resp      Temp      Temp src      SpO2      Weight      Height      Head Circumference      Peak Flow      Pain Score      Pain Loc      Pain Edu?      Excl. in [place]?    Orthostatic VS for the past 24 hrs:  BP- Lying Pulse- Lying BP- Sitting Pulse- Sitting BP- Standing at 0 minutes Pulse- Standing at 0 minutes  04/16/22 1057 128/82 82 (!) 140/94 82 128/84 87    Updated Vital Signs BP (!) 145/88 (BP Location: Left Arm) Comment (BP Location): regular cuff on forearm  Pulse 84   Temp 98.2 F (36.8 C) (Oral)   Resp 20   LMP 03/28/2022   SpO2 97%   Physical Exam Vitals reviewed.  Constitutional:      General: She is not in acute distress.    Appearance: She is well-developed. She is obese. She is not ill-appearing, toxic-appearing or diaphoretic.  HENT:     Head: Normocephalic.  Eyes:     Extraocular Movements: Extraocular movements intact.     Pupils: Pupils are equal, round, and reactive to light.  Neck:     Comments: Tenderness to location of the paraspinal muscles and upper trapezius Cardiovascular:     Rate and Rhythm: Normal rate and regular rhythm.     Heart sounds: Normal heart sounds. No murmur heard. Pulmonary:     Effort: Pulmonary effort is normal. No respiratory distress.     Breath sounds: No wheezing.  Musculoskeletal:     Cervical back: Normal range of motion.  Skin:    General: Skin is warm.  Neurological:     Mental Status: She is alert and oriented to person, place, and time.     Cranial Nerves: No cranial nerve deficit or facial asymmetry.  Psychiatric:        Mood and Affect:  Mood normal.        Speech: Speech normal.        Behavior: Behavior normal.      UC Treatments / Results  Labs (all labs ordered are listed, but only abnormal results are displayed) Labs Reviewed  POCT URINALYSIS DIPSTICK, ED / UC - Abnormal; Notable for the following components:      Result Value   Ketones, ur TRACE (*)    All other components within normal limits  CBC WITH DIFFERENTIAL/PLATELET  BASIC METABOLIC PANEL    EKG   Radiology No results found.  Procedures Procedures (including critical care time)  Medications Ordered in UC Medications - No data to display  Initial Impression / Assessment and Plan / UC Course  I have reviewed the triage vital signs and the nursing notes.  Pertinent labs & imaging results that were available during my care of the patient were reviewed by me and considered in my medical decision making (see chart for details).   Patient is here today for some lightheadedness.  She reports she may have had 1 episode of passing out on Tuesday however she woke up and was able to drive her car home.  Her primary care provider recommended evaluation at the ER however she was to wait at the ER.  Today her vitals are stable.  CBC and BMP collected.  Point-of-care urinalysis within normal limits.  Patient denies any cardiac symptoms.  Was feeling of lightheadedness and fatigue may be due to but it to her chronically low hemoglobin.  Orthostatic vital signs taken today normal.  Patient has a follow-up appoint with her primary care provider on Monday.  Patient may need further outpatient work-up however she is stable today.  I counseled her on concerning symptoms, if she has another episode of syncope she should present to the ER.  She verbalized understanding. Final Clinical Impressions(s) / UC Diagnoses   Final diagnoses:  Lightheadedness     Discharge Instructions      You were seen here today for lightheadedness.  We collected some labs.  Urgent  care staff will call you if there are any abnormal results.  I would recommend continuing your work-up with your primary care provider.  Make sure that you are drinking enough water and eating regularly.  If you have another episode of syncope I recommend evaluation at the ER.     ED Prescriptions   None    PDMP not reviewed this encounter.   Elmore Guise, DO 04/16/22 1131

## 2022-04-16 NOTE — ED Triage Notes (Addendum)
Complains of headache for one week.    10/10 patient went to work, was dizzy, lightheaded.  Left to go home.  Patient reports she started her car, but remembers nothing else.  Another employee saw patient in running car, staff tried knocking on car, but patient states she woke self.  Patient then drove home.    Tried to work yesterday, felt light headed and was sent home.  Both days has slept all day once getting home.    Pain is right side of head and neck.  Patient feels "unfocussed ".    Pcp told patient to go to ED, she did, but she would have to wait

## 2022-04-17 NOTE — Progress Notes (Unsigned)
Patient ID: Ruth [name], female    DOB: 12-08-1975  MRN: [id number]  CC: Urgent Care Follow-Up  Subjective: Ruth [name] is a 46 y.o. female who presents for urgent care follow-up.  Her concerns today include:  04/16/2022 [facility] per DO note: Pertinent labs & imaging results that were available during my care of the patient were reviewed by me and considered in my medical decision making (see chart for details). Patient is here today for some lightheadedness.  She reports she may have had 1 episode of passing out on Tuesday however she woke up and was able to drive her car home.  Her primary care provider recommended evaluation at the ER however she was to wait at the ER.  Today her vitals are stable.  CBC and BMP collected.  Point-of-care urinalysis within normal limits.  Patient denies any cardiac symptoms.  Was feeling of lightheadedness and fatigue may be due to but it to her chronically low hemoglobin.  Orthostatic vital signs taken today normal.  Patient has a follow-up appoint with her primary care provider on Monday.  Patient may need further outpatient work-up however she is stable today.  I counseled her on concerning symptoms, if she has another episode of syncope she should present to the ER.  She verbalized understanding.  You were seen here today for lightheadedness.  We collected some labs.  Urgent care staff will call you if there are any abnormal results.  I would recommend continuing your work-up with your primary care provider.  Make sure that you are drinking enough water and eating regularly.  If you have another episode of syncope I recommend evaluation at the ER.  Today's visit 04/20/2022: Reports today feeling well. Denies dizziness/fainting since last episode. She does still feel fatigued. Taking allergy medication and Mucinex for headaches as needed. States having periods since 46 years old. Reports periods have always been  heavy flow with a lot of cramping. In the past had a cervical ablation without improvement of symptoms. States she was supposed to get a hysterectomy in the past  but was canceled due to hemoglobin A1c was above goal. Reports her diet includes iron-rich foods. Her period is due to begin around 04/24/2022. 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 Endorses vaginal itching without additional symptoms. Hydradenitis of bilateral underarms. Itching worse during nighttime. Benadryl helps some. Today patient was able to show me bilateral underarms with scarring from episodes. States she was referred to Dermatology in the past and was never seen.  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/16/2021   BMI 45.0-49.9, adult ([place]) 09/04/2020   Right arm pain 02/14/2020   Hyperlipidemia associated with type 2 diabetes mellitus ([place]) 09/19/2019   Abnormal uterine bleeding (AUB) 12/07/2017   Abnormal mammogram of both breasts 11/02/2016   Hypothyroidism 06/21/2009   Type 2 diabetes mellitus with peripheral neuropathy ([place]) 06/21/2009   BIPOLAR AFFECTIVE DISORDER 06/21/2009   PERSISTENT DISORDER INITIATING/MAINTAINING SLEEP 06/21/2009   INADEQUATE SLEEP HYGIENE 06/21/2009   OBSTRUCTIVE SLEEP APNEA 06/21/2009   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 1 each 0   Continuous Blood Gluc Sensor (DEXCOM G7 SENSOR) MISC Use to check blood sugar three time daily. Change sensors  once every 10 days. 3 each 2   amLODipine (NORVASC) 5 MG tablet Take 1 tablet (5 mg total) by mouth daily. (Patient not taking: Reported on 03/13/2022) 30 tablet 5   atorvastatin (LIPITOR) 80 MG tablet Take 1 tablet (80 mg total) by mouth daily at 6 PM. 30 tablet 5   clindamycin (CLINDAGEL) 1 % gel Apply topically 2 (two) times daily. 30 g 0   Continuous Blood Gluc Transmit (DEXCOM G6 TRANSMITTER) MISC Use to check blood sugar three times daily. 1 each 1   Insulin Pen Needle (TRUEPLUS 5-BEVEL PEN NEEDLES) 32G X 4 MM MISC Use to inject Ozempic once a week. 100 each 1   naproxen (NAPROSYN) 500 MG tablet Take 1 tablet (500 mg total) by mouth 2 (two) times daily. 30 tablet 0   potassium chloride (KLOR-CON) 10 MEQ tablet Take 1 tablet (10 mEq total) by mouth daily for 5 days. 5 tablet 0   Semaglutide,0.25 or 0.'5MG'$ /DOS, (OZEMPIC, 0.25 OR 0.5 MG/DOSE,) 2 MG/3ML SOPN Inject 0.25 mg into the skin once a week. For 4 weeks. Then, increase to 0.5 mg subq once weekly thereafter. 3 mL 2   No current facility-administered medications on file prior to visit.    Allergies  Allergen Reactions   Metformin And Related Diarrhea   Penicillins Other (See Comments)    Unknown; from childhood Has patient had a PCN reaction causing immediate rash, facial/tongue/throat swelling, SOB or lightheadedness with hypotension: Unknow Has patient had a PCN reaction causing severe rash involving mucus membranes or skin necrosis: Unknown Has patient had a PCN reaction that required hospitalization: Unknown Has patient had a PCN reaction occurring within the last 10 years: Unknown If all of the above answers are "NO", then may proceed with Cephalosporin use.    Victoza [Liraglutide] Itching    Social History   Socioeconomic History   Marital status: Married    Spouse name: Not on file   Number of children: Not on file   Years of education: Not on file   Highest education level: Not on file  Occupational History   Not on  file  Tobacco Use   Smoking status: Every Day    Packs/day: 0.25    Types: Cigarettes   Smokeless tobacco: Never  Vaping Use   Vaping Use: Never used  Substance and Sexual Activity   Alcohol use: Not Currently   Drug use: Not Currently    Types: Marijuana   Sexual activity: Not Currently    Birth control/protection: Surgical    Comment: tubal ligation  Other Topics Concern   Not on file  Social History Narrative   Not on file   Social Determinants of Health   Financial Resource Strain: Not on file  Food Insecurity: Not on file  Transportation Needs: Not on file  Physical Activity: Not on file  Stress: Not on file  Social Connections: Not on file  Intimate Partner Violence: Not on file    Family History  Problem Relation Age of Onset   Thyroid disease Mother    Diabetes Daughter    Diabetes Son    Breast cancer Neg Hx     Past Surgical History:  Procedure Laterality Date  ANTERIOR CERVICAL DECOMP/DISCECTOMY FUSION N/A 01/16/2021   Procedure: Cervical five-six Anterior cervical decompression/discectomy/fusion;  Surgeon: Consuella Lose, MD;  Location: [facility];  Service: Neurosurgery;  Laterality: N/A;   CARPAL TUNNEL RELEASE Right    CESAREAN SECTION     x2   DILITATION & CURRETTAGE/HYSTROSCOPY WITH NOVASURE ABLATION N/A 12/07/2017   Procedure: DILATATION & CURETTAGE/HYSTEROSCOPY WITH ATTEMPED NOVASURE ABLATION, FAILED/ENDOMETERIAL ABLATION WITH HTA SYSTEM;  Surgeon: Lavonia Drafts, MD;  Location: [facility];  Service: Gynecology;  Laterality: N/A;   ELBOW SURGERY     "surgery for nerve pain"   GANGLION CYST EXCISION Right    right arm    RECTOVAGINAL FISTULA CLOSURE     after vag delivery   TONSILLECTOMY     childhood   TUBAL LIGATION      ROS: Review of Systems Negative except as stated above  PHYSICAL EXAM: BP 110/79 (BP Location: Left Arm, Patient Position: Sitting, Cuff Size: Large)   Pulse 97   Temp 98.3 F (36.8 C)   Resp 16   Wt 280 lb  (127 kg)   LMP 03/28/2022   SpO2 98%   BMI 45.19 kg/m   Physical Exam HENT:     Head: Normocephalic and atraumatic.  Eyes:     Extraocular Movements: Extraocular movements intact.     Conjunctiva/sclera: Conjunctivae normal.     Pupils: Pupils are equal, round, and reactive to light.  Cardiovascular:     Rate and Rhythm: Normal rate and regular rhythm.     Pulses: Normal pulses.     Heart sounds: Normal heart sounds.  Pulmonary:     Effort: Pulmonary effort is normal.     Breath sounds: Normal breath sounds.  Musculoskeletal:     Cervical back: Normal range of motion and neck supple.  Neurological:     General: No focal deficit present.     Mental Status: She is alert and oriented to person, place, and time.  Psychiatric:        Mood and Affect: Mood normal.        Behavior: Behavior normal.     ASSESSMENT AND PLAN: 1. Dysmenorrhea 2. Menorrhagia with regular cycle 3. Chronic anemia - Chronic.  - Ferrous sulfate as prescribed. Counseled on medication adherence/adverse effects.  - Referral to Gynecology for further evaluation and management.  - Ambulatory referral to Gynecology - ferrous sulfate 325 (65 FE) MG tablet; Take 1 tablet (325 mg total) by mouth daily with breakfast.  Dispense: 30 tablet; Refill: 2  4. Vaginal itching - Routine screening.  - Cervicovaginal ancillary only  5. Hidradenitis axillaris - Referral to Dermatology for further evaluation and management.  - Ambulatory referral to Dermatology    Patient was given the opportunity to ask questions.  Patient verbalized understanding of the plan and was able to repeat key elements of the plan. Patient was given clear instructions to go to Emergency Department or return to medical center if symptoms don't improve, worsen, or new problems develop.The patient verbalized understanding.   Orders Placed This Encounter  Procedures   Ambulatory referral to Gynecology   Ambulatory referral to Dermatology      Requested Prescriptions   Signed Prescriptions Disp Refills   ferrous sulfate 325 (65 FE) MG tablet 30 tablet 2    Sig: Take 1 tablet (325 mg total) by mouth daily with breakfast.    Follow-up with Dorna Mai, MD as needed.   Camillia Herter, NP

## 2022-04-20 ENCOUNTER — Ambulatory Visit (INDEPENDENT_AMBULATORY_CARE_PROVIDER_SITE_OTHER): Payer: Medicaid Other | Admitting: Family

## 2022-04-20 ENCOUNTER — Encounter: Payer: Self-pay | Admitting: Family

## 2022-04-20 ENCOUNTER — Other Ambulatory Visit (HOSPITAL_COMMUNITY)
Admission: RE | Admit: 2022-04-20 | Discharge: 2022-04-20 | Disposition: A | Discharge status: Home / Self Care | Payer: Medicaid Other | Source: Ambulatory Visit | Attending: Family | Admitting: Family

## 2022-04-20 VITALS — BP 110/79 | HR 97 | Temp 98.3°F | Resp 16 | Wt 280.0 lb

## 2022-04-20 DIAGNOSIS — N898 Other specified noninflammatory disorders of vagina: Secondary | ICD-10-CM | POA: Diagnosis not present

## 2022-04-20 DIAGNOSIS — L732 Hidradenitis suppurativa: Secondary | ICD-10-CM | POA: Diagnosis not present

## 2022-04-20 DIAGNOSIS — N946 Dysmenorrhea, unspecified: Secondary | ICD-10-CM

## 2022-04-20 DIAGNOSIS — D649 Anemia, unspecified: Secondary | ICD-10-CM

## 2022-04-20 DIAGNOSIS — N920 Excessive and frequent menstruation with regular cycle: Secondary | ICD-10-CM | POA: Diagnosis not present

## 2022-04-20 DIAGNOSIS — R42 Dizziness and giddiness: Secondary | ICD-10-CM

## 2022-04-20 MED ORDER — FERROUS SULFATE 325 (65 FE) MG PO TABS: 325.0000 mg | ORAL_TABLET | Freq: Every day | ORAL | 2 refills | Status: DC

## 2022-04-20 NOTE — Progress Notes (Signed)
Pt presents for follow/up of urgent care -states she's feeling better but still a little fatigue -pt experiencing vaginal itching, declined U/A stated one just completed 10/12

## 2022-04-20 NOTE — Patient Instructions (Signed)
Menorrhagia Menorrhagia is a form of abnormal uterine bleeding in which menstrual periods are heavy or last longer than normal. With menorrhagia, the periods may cause enough blood loss and cramping that a woman becomes unable to take part in her usual activities. What are the causes? Common causes of this condition include: Polyps or fibroids. These are noncancerous growths in the uterus. An imbalance of the hormones estrogen and progesterone. Anovulation, which occurs when one of the ovaries does not release an egg during one or more months. A problem with the thyroid gland (hypothyroidism). Side effects of having an intrauterine device (IUD). Side effects of some medicines, such as NSAIDs or blood thinners. A bleeding disorder that stops the blood from clotting normally. In some cases, the cause of this condition is not known. What increases the risk? You are more likely to develop this condition if you have cancer of the uterus. What are the signs or symptoms? Symptoms of this condition include: Routinely having to change your pad or tampon every 1-2 hours because it is soaked. Needing to use pads and tampons at the same time because of heavy bleeding. Needing to wake up to change your pads or tampons during the night. Passing blood clots larger than 1 inch (2.5 cm) in size. Having bleeding that lasts for more than 7 days. Having symptoms of low iron levels (anemia), such as tiredness (fatigue) or shortness of breath. How is this diagnosed? This condition may be diagnosed based on: A physical exam. Your symptoms and menstrual history. Tests, such as: Blood tests to check if you are pregnant or if you have hormonal changes, a bleeding or thyroid disorder, anemia, or other problems. Pap test to check for cancerous changes, infections, or inflammation. Endometrial biopsy. This test involves removing a tissue sample from the lining of the uterus (endometrium) to be examined under a  microscope. Pelvic ultrasound. This test uses sound waves to create images of your uterus, ovaries, and vagina. The images can show if you have fibroids or other growths. Hysteroscopy. For this test, a thin, flexible tube with a light on the end (hysteroscope) is used to look inside your uterus. How is this treated? Treatment may not be needed for this condition. If it is needed, the best treatment for you will depend on: Whether you need to prevent pregnancy. Your desire to have children in the future. The cause and severity of your bleeding. Your personal preference. Medicine Medicines are the first step in treatment. You may be treated with: Hormonal birth control methods. These treatments reduce bleeding during your menstrual period. They include: Birth control pills. Skin patch. Vaginal ring. Shots (injections) that you get every 3 months. Hormonal IUD. Implants that go under the skin. Medicines that thicken the blood and slow bleeding. Medicines that reduce swelling, such as ibuprofen. Medicines that contain an artificial (synthetic) hormone called progestin. Medicines that make the ovaries stop working for a short time. Iron supplements to treat anemia.  Surgery If medicines do not work, surgery may be done. Surgical options may include: Dilation and curettage (D&C). In this procedure, your health care provider opens the lowest part of the uterus (cervix) and then scrapes or suctions tissue from the endometrium. This reduces menstrual bleeding. Operative hysteroscopy. In this procedure, a hysteroscope is used to view your uterus and help remove polyps that may be causing heavy periods. Endometrial ablation. This is when various techniques are used to permanently destroy your entire endometrium. After endometrial ablation, most women have little  or no menstrual flow. This procedure reduces your ability to become pregnant. Endometrial resection. In this procedure, an electrosurgical  wire loop is used to remove the endometrium. This procedure reduces your ability to become pregnant. Hysterectomy. This is surgical removal of your uterus. This is a permanent procedure that stops menstrual periods. Pregnancy is not possible after a hysterectomy. Follow these instructions at home: Medicines Take over-the-counter and prescription medicines only as told by your health care provider. This includes iron pills. Do not change or switch medicines without asking your health care provider. Do not take aspirin or medicines that contain aspirin 1 week before or during your menstrual period. Aspirin may make bleeding worse. Managing constipation Your iron pills may cause constipation. If you are taking prescription iron supplements, you may need to take these actions to prevent or treat constipation: Drink enough fluid to keep your urine pale yellow. Take over-the-counter or prescription medicines. Eat foods that are high in fiber, such as beans, whole grains, and fresh fruits and vegetables. Limit foods that are high in fat and processed sugars, such as fried or sweet foods. General instructions If you need to change your sanitary pad or tampon more than once every 2 hours, limit your activity until the bleeding stops. Eat well-balanced meals, including foods that are high in iron. Foods that have a lot of iron include leafy green vegetables, meat, liver, eggs, and whole-grain breads and cereals. Do not try to lose weight until the abnormal bleeding has stopped and your blood iron level is back to normal. If you need to lose weight, work with your health care provider to lose weight safely. Keep all follow-up visits. This is important. Contact a health care provider if: You soak through a pad or tampon every 1 or 2 hours, and this happens every time you have a period. You need to use pads and tampons at the same time because you are bleeding so much. You have nausea, vomiting, diarrhea, or  other problems related to medicines you are taking. Get help right away if: You soak through more than a pad or tampon in 1 hour. You pass clots bigger than 1 inch (2.5 cm) wide. You feel short of breath. You feel like your heart is beating too fast. You feel dizzy or you faint. You feel very weak or tired. Summary Menorrhagia is a form of abnormal uterine bleeding in which menstrual periods are heavy or last longer than normal. Treatment may not be needed for this condition. If it is needed, it may include medicines or procedures. Take over-the-counter and prescription medicines only as told by your health care provider. This includes iron pills. Get help right away if you have heavy bleeding that soaks through more than a pad or tampon in 1 hour, you pass large clots, or you feel dizzy, short of breath, or very weak or tired. This information is not intended to replace advice given to you by your health care provider. Make sure you discuss any questions you have with your health care provider. Document Revised: 03/05/2020 Document Reviewed: 03/05/2020 Elsevier Patient Education  [place].

## 2022-04-21 ENCOUNTER — Other Ambulatory Visit: Payer: Self-pay

## 2022-04-21 ENCOUNTER — Ambulatory Visit: Payer: Medicaid Other | Attending: Family Medicine | Admitting: Pharmacist

## 2022-04-21 DIAGNOSIS — E1142 Type 2 diabetes mellitus with diabetic polyneuropathy: Secondary | ICD-10-CM

## 2022-04-21 LAB — CERVICOVAGINAL ANCILLARY ONLY
Bacterial Vaginitis (gardnerella): NEGATIVE
Candida Glabrata: NEGATIVE
Candida Vaginitis: NEGATIVE
Chlamydia: NEGATIVE
Comment: NEGATIVE
Comment: NEGATIVE
Comment: NEGATIVE
Comment: NEGATIVE
Comment: NEGATIVE
Comment: NORMAL
Neisseria Gonorrhea: NEGATIVE
Trichomonas: NEGATIVE

## 2022-04-21 MED ORDER — OZEMPIC (0.25 OR 0.5 MG/DOSE) 2 MG/3ML ~~LOC~~ SOPN
0.5000 mg | PEN_INJECTOR | SUBCUTANEOUS | 2 refills | Status: DC
  Filled 2022-04-21: qty 3, 28d supply, fill #0
  Filled 2022-05-19 – 2022-05-20 (×2): qty 3, 28d supply, fill #1
  Filled 2022-06-10 – 2022-06-18 (×2): qty 3, 28d supply, fill #2

## 2022-04-21 NOTE — Progress Notes (Signed)
.    S:    No chief complaint on file.  Ruth [name] is a 46 y.o. female who presents for diabetes evaluation, education, and management. PMH is significant for T2DM and HLD. Patient was referred and last seen by Primary Care Provider, Dr. Redmond Pulling, on 03/13/2022. At last visit, A1c was 8.7% and glipizide was increased from 10 mg daily to BID. We saw her on 03/16/2022. She admitted to not taking insulin in several months. She also admitted to non-adherence to glipizide. She was willing to try Ozempic. She has a previous intolerance to metformin and Victoza.  Today, patient arrives in good spirits and presents without any assistance. She is doing okay since adding Ozempic. No side effects initially. She does endorse a HA and fatigue since increasing to the 0.5 mg weekly dose. Denies any abdominal pain, NV. Of note, she is also anemic and is starting iron. Her primary care team has also submitted a referral to GYN for this.  Patient reports Diabetes was diagnosed in 2009-2010.   Family/Social History:  DM- daughter, brother, sister  No family or personal history of thyroid cancer (mother has history of hyperthyroidism requiring radiation).   Current diabetes medications include: Ozempic 0.5 mg weekly  Patient reports adherence to all medications.  Insurance coverage: Medicaid, [employer]  Patient denies polyuria. Patient denies neuropathy (nerve pain). Patient reports visual changes. Patient reports self foot exams.   Dietary habits:  -Appetite has decreased since addition of Ozempic.  -Has increased her water intake since last visit. Still will drink regular soda occasionally.  -Limits bread, pasta, potatoes.   Exercise:  -None  O:  CGM: Dexcom G7 in place.  Current (post-prandial): 157  Spends majority of her time in goal range. Variation is minimal. She seems to maintain her range in the 150-170 range most of the day. Her spikes are quickly corrected and her highest level since  last visit is 220.   Lab Results  Component Value Date   HGBA1C 8.7 (H) 03/13/2022   Lipid Panel     Component Value Date/Time   CHOL 227 (H) 03/13/2022 1020   TRIG 106 03/13/2022 1020   HDL 48 03/13/2022 1020   CHOLHDL 4.7 (H) 03/13/2022 1020   LDLCALC 160 (H) 03/13/2022 1020    Clinical Atherosclerotic Cardiovascular Disease (ASCVD): No  The 10-year ASCVD risk score (Arnett DK, et al., 2019) is: 9.8%   Values used to calculate the score:     Age: 56 years     Sex: Female     Is Non-Hispanic African American: Yes     Diabetic: Yes     Tobacco smoker: Yes     Systolic Blood Pressure: 297 mmHg     Is BP treated: Yes     HDL Cholesterol: 48 mg/dL     Total Cholesterol: 227 mg/dL   Patient is participating in a Managed Medicaid Plan:  Yes   A/P: Diabetes longstanding currently close to goal based on A1c. Patient is able to verbalize appropriate hypoglycemia management plan. Levels from home look to be responding. Her HA and fatigue are likely related to anemia but I have encouraged her to continue to monitor this. We may have to modify therapy in the future.  -Continue GLP-1 Ozempic (semaglutide) 0.5 mg weekly. She just increased to the 0.5 mg dose last week. -Patient educated on purpose, proper use, and potential adverse effects of Ozempic.  -Extensively discussed pathophysiology of diabetes, recommended lifestyle interventions, dietary effects on blood sugar  control.  -Counseled on s/sx of and management of hypoglycemia.  -Next A1c anticipated 06/2022.   Follow-up:  With me in December.  PCP clinic visit in January 2024.   Benard Halsted, PharmD, [place], [place] [phone]

## 2022-04-27 ENCOUNTER — Other Ambulatory Visit: Payer: Self-pay

## 2022-05-03 DIAGNOSIS — J111 Influenza due to unidentified influenza virus with other respiratory manifestations: Secondary | ICD-10-CM | POA: Diagnosis not present

## 2022-05-07 ENCOUNTER — Other Ambulatory Visit: Payer: Self-pay

## 2022-05-08 ENCOUNTER — Encounter: Payer: Self-pay | Admitting: Family

## 2022-05-08 NOTE — Telephone Encounter (Signed)
Called pt and provided # to Dermatology

## 2022-05-14 DIAGNOSIS — M542 Cervicalgia: Secondary | ICD-10-CM | POA: Diagnosis not present

## 2022-05-20 ENCOUNTER — Other Ambulatory Visit (HOSPITAL_COMMUNITY): Payer: Self-pay

## 2022-05-20 ENCOUNTER — Other Ambulatory Visit: Payer: Self-pay

## 2022-05-27 ENCOUNTER — Other Ambulatory Visit: Payer: Self-pay

## 2022-06-02 ENCOUNTER — Other Ambulatory Visit: Payer: Self-pay

## 2022-06-03 ENCOUNTER — Ambulatory Visit (HOSPITAL_COMMUNITY)
Admission: RE | Admit: 2022-06-03 | Discharge: 2022-06-03 | Disposition: A | Discharge status: Home / Self Care | Payer: Medicaid Other | Source: Ambulatory Visit | Attending: Sports Medicine | Admitting: Sports Medicine

## 2022-06-03 ENCOUNTER — Encounter (HOSPITAL_COMMUNITY): Payer: Self-pay

## 2022-06-03 VITALS — BP 138/84 | HR 81 | Temp 98.0°F | Resp 12

## 2022-06-03 DIAGNOSIS — L0291 Cutaneous abscess, unspecified: Secondary | ICD-10-CM | POA: Diagnosis not present

## 2022-06-03 MED ORDER — LIDOCAINE-EPINEPHRINE 1 %-1:100000 IJ SOLN: 5.0000 mL | Freq: Once | INTRAMUSCULAR | Status: DC

## 2022-06-03 MED ORDER — DOXYCYCLINE HYCLATE 100 MG PO CAPS: 100.0000 mg | ORAL_CAPSULE | Freq: Two times a day (BID) | ORAL | 0 refills | Status: DC

## 2022-06-03 MED ORDER — LIDOCAINE-EPINEPHRINE 1 %-1:100000 IJ SOLN
INTRAMUSCULAR | Status: AC
  Filled 2022-06-03: qty 1

## 2022-06-03 NOTE — Discharge Instructions (Addendum)
Will call you with results of fluid analysis in the lab if it is positive for bacteria Take the doxycycline antibiotic 2x per day See your dermatologist to discuss HS Say hello to me at the [employer] game on Friday!

## 2022-06-03 NOTE — ED Triage Notes (Signed)
Pt is here for cyst drainage on stomach color yellow x 1wk

## 2022-06-03 NOTE — ED Provider Notes (Signed)
[facility]   [id number] 06/03/22 Arrival Time: 7829  ASSESSMENT & PLAN:  1. Abscess    -This seems to be an abscess in the right lower quadrant fold that would be consistent with spreading hidradenitis.  I&D was performed today.  Fluid was swabbed and sent for culture.  Will prescribe doxycycline for empiric coverage.  Advised her to follow-up with her dermatologist to discuss treatment of HS. all questions were answered she agrees to plan.  PROCEDURE NOTE: Patient was supine on the table.  Prior to the procedure, some yellow fluid was expressed from the abscess and collected with a swab to send for culture.  Then the overlying skin was prepped with alcohol x2.  5 cc 1% lidocaine with epinephrine was used as local anesthetic.  Using an 11 blade I made a small, less than 1 cm horizontal incision across the top of the abscess.  A small amount of yellow fluid was expressed from the abscess until none remained.  There was no frank purulence.  The wound was not deep enough to pack.  It was dressed with gauze.  Patient tolerated the procedure well.  Meds ordered this encounter  Medications   lidocaine-EPINEPHrine (XYLOCAINE W/EPI) 1 %-1:100000 (with pres) injection 5 mL   doxycycline (VIBRAMYCIN) 100 MG capsule    Sig: Take 1 capsule (100 mg total) by mouth 2 (two) times daily.    Dispense:  20 capsule    Refill:  0     Discharge Instructions      Will call you with results of fluid analysis in the lab if it is positive for bacteria Take the doxycycline antibiotic 2x per day See your dermatologist to discuss HS Say hello to me at the [place] game on Friday!        Reviewed expectations re: course of current medical issues. Questions answered. Outlined signs and symptoms indicating need for more acute intervention. Patient verbalized understanding. After Visit Summary given.   SUBJECTIVE: Pleasant 46 year old female with history of T2DM and HS comes urgent care to  discuss an abscess on her abdomen.  She has not had a abscess on her right lower quadrant of her abdomen for about a week.  On Saturday it busted open while she was at her family's house.  She had to use gauze to absorb all the fluid that came out.  Eventually closed up until last night.  Last night the abscess started draining yellow fluid.  She woke up and her nightgown was soaked with fluid.  She came here for further evaluation.  She denies any fevers any abdominal pain, nausea or vomiting.  She has an upcoming appointment with a dermatologist to discuss at bedtime.  Her primary care doctor diagnosed her with this but she has not had any treatment yet.  Patient's last menstrual period was 05/18/2022. Past Surgical History:  Procedure Laterality Date   ANTERIOR CERVICAL DECOMP/DISCECTOMY FUSION N/A 01/16/2021   Procedure: Cervical five-six Anterior cervical decompression/discectomy/fusion;  Surgeon: Consuella Lose, MD;  Location: [facility];  Service: Neurosurgery;  Laterality: N/A;   CARPAL TUNNEL RELEASE Right    CESAREAN SECTION     x2   DILITATION & CURRETTAGE/HYSTROSCOPY WITH NOVASURE ABLATION N/A 12/07/2017   Procedure: DILATATION & CURETTAGE/HYSTEROSCOPY WITH ATTEMPED NOVASURE ABLATION, FAILED/ENDOMETERIAL ABLATION WITH HTA SYSTEM;  Surgeon: Lavonia Drafts, MD;  Location: [facility];  Service: Gynecology;  Laterality: N/A;   ELBOW SURGERY     "surgery for nerve pain"   GANGLION CYST EXCISION Right  right arm    RECTOVAGINAL FISTULA CLOSURE     after vag delivery   TONSILLECTOMY     childhood   TUBAL LIGATION       OBJECTIVE:  Vitals:   06/03/22 0932  BP: 138/84  Pulse: 81  Resp: 12  Temp: 98 F (36.7 C)  TempSrc: Oral  SpO2: 98%     Physical Exam Vitals reviewed.  Constitutional:      General: She is not in acute distress.    Appearance: She is obese.  Cardiovascular:     Rate and Rhythm: Normal rate.  Pulmonary:     Effort: Pulmonary effort is normal.   Abdominal:     General: Bowel sounds are normal.     Palpations: Abdomen is soft.     Comments: 1cm oval shaped superficial abscess draining yellow fluid from RLQ. There is a 2cm surrounding area of induration. No surrounding erythema.   Neurological:     General: No focal deficit present.  Psychiatric:        Mood and Affect: Mood normal.      Labs: Results for orders placed or performed in visit on 04/20/22  Cervicovaginal ancillary only  Result Value Ref Range   Bacterial Vaginitis (gardnerella) Negative    Candida Vaginitis Negative    Candida Glabrata Negative    Trichomonas Negative    Chlamydia Negative    Neisseria Gonorrhea Negative    Comment Normal Reference Range Candida Species - Negative    Comment      Normal Reference Range Bacterial Vaginosis - Negative   Comment Normal Reference Range Candida Galbrata - Negative    Comment Normal Reference Range Trichomonas - Negative    Comment Normal Reference Ranger Chlamydia - Negative    Comment      Normal Reference Range Neisseria Gonorrhea - Negative   Labs Reviewed  AEROBIC CULTURE W GRAM STAIN (SUPERFICIAL SPECIMEN)    Imaging: No results found.   Allergies  Allergen Reactions   Metformin And Related Diarrhea   Penicillins Other (See Comments)    Unknown; from childhood Has patient had a PCN reaction causing immediate rash, facial/tongue/throat swelling, SOB or lightheadedness with hypotension: Unknow Has patient had a PCN reaction causing severe rash involving mucus membranes or skin necrosis: Unknown Has patient had a PCN reaction that required hospitalization: Unknown Has patient had a PCN reaction occurring within the last 10 years: Unknown If all of the above answers are "NO", then may proceed with Cephalosporin use.    Victoza [Liraglutide] Itching                                               Past Medical History:  Diagnosis Date   Anemia    Anxiety    Arthritis    chronic back pain     Asthma    rarely uses inhaler   Bipolar 1 disorder (HCC)    Fibromyalgia    GERD (gastroesophageal reflux disease)    Headache    otc med   HLD (hyperlipidemia)    diet controlled   Hx of migraines    occ   Hypertension    Hypothyroidism    Obesity    Sleep apnea    does not use  cpap   Thrombocytosis 01/26/2020   Type 2 diabetes mellitus ([place])    Type 2  Social History   Socioeconomic History   Marital status: Married    Spouse name: Not on file   Number of children: Not on file   Years of education: Not on file   Highest education level: Not on file  Occupational History   Not on file  Tobacco Use   Smoking status: Every Day    Packs/day: 0.25    Types: Cigarettes   Smokeless tobacco: Never  Vaping Use   Vaping Use: Never used  Substance and Sexual Activity   Alcohol use: Not Currently   Drug use: Not Currently    Types: Marijuana   Sexual activity: Not Currently    Birth control/protection: Surgical    Comment: tubal ligation  Other Topics Concern   Not on file  Social History Narrative   Not on file   Social Determinants of Health   Financial Resource Strain: Not on file  Food Insecurity: Not on file  Transportation Needs: Not on file  Physical Activity: Not on file  Stress: Not on file  Social Connections: Not on file  Intimate Partner Violence: Not on file    Family History  Problem Relation Age of Onset   Thyroid disease Mother    Diabetes Daughter    Diabetes Son    Breast cancer Neg Hx       Ruth [name], Dorian Pod, MD 06/03/22 1041

## 2022-06-05 LAB — AEROBIC CULTURE W GRAM STAIN (SUPERFICIAL SPECIMEN)
Culture: NORMAL
Gram Stain: NONE SEEN

## 2022-06-08 ENCOUNTER — Encounter: Payer: Self-pay | Admitting: Obstetrics and Gynecology

## 2022-06-08 ENCOUNTER — Ambulatory Visit (INDEPENDENT_AMBULATORY_CARE_PROVIDER_SITE_OTHER): Payer: Medicaid Other | Admitting: Obstetrics and Gynecology

## 2022-06-08 VITALS — BP 132/87 | HR 82 | Ht 66.0 in | Wt 261.7 lb

## 2022-06-08 DIAGNOSIS — N939 Abnormal uterine and vaginal bleeding, unspecified: Secondary | ICD-10-CM

## 2022-06-08 DIAGNOSIS — N946 Dysmenorrhea, unspecified: Secondary | ICD-10-CM

## 2022-06-08 DIAGNOSIS — Z01419 Encounter for gynecological examination (general) (routine) without abnormal findings: Secondary | ICD-10-CM | POA: Diagnosis not present

## 2022-06-08 MED ORDER — CELECOXIB 200 MG PO CAPS: 200.0000 mg | ORAL_CAPSULE | Freq: Every day | ORAL | 2 refills | Status: AC

## 2022-06-08 NOTE — Progress Notes (Signed)
NEW GYNECOLOGY PATIENT Patient name: Ruth [name] MRN [id number]  Date of birth: 29-Jul-1975 Chief Complaint:   Menorrhagia and Dysmenorrhea     History:  Ruth [name] is a 46 y.o. [id number] being seen today for AUB and dysmenorrhea.    Monthly cycles, 7 days of heavy bleeding. Prior depo use, stopped 15 years ago.   Tried different birth control, "acid" pills, attempted to prescribe soemthing else but insurance didn't cover it. Doesn't want to use birth control now due to it now roking it before. Was supposed to have a hysterectomy but hyperglycemic. Has since had neck surgery without issues. States her sugars have gotten better. Would like to proceed with hysterectomy if able since she has tried all medication and ablation.   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 Contraception: tubal ligation Last Pap: 10/2017. Result was normal with negative HPV Last Mammogram: 02/2020.  Result was normal (BIRADS 3) Last Colonoscopy: n/a  Obstetric History OB History  Gravida Para Term Preterm AB Living  '7 5 4 1 2 5  '$ SAB IAB Ectopic Multiple Live Births  2       5    # Outcome Date GA Lbr Len/2nd Weight Sex Delivery Anes PTL Lv  7 Term      VBAC   LIV  6 Term      CS-LTranv   LIV  5 Term      Vag-Spont   LIV  4 Term      Vag-Spont   LIV  3 Preterm      CS-LTranv   LIV  2 SAB           1 SAB             Obstetric Comments  Vag x 2>c/s emergency>vbac>c/s and BTL  H/o RV fistula and then repaired after vag delivery    Past Medical History:  Diagnosis Date   Anemia    Anxiety    Arthritis    chronic back pain    Asthma    rarely uses inhaler   Bipolar 1 disorder (HCC)    Fibromyalgia    GERD (gastroesophageal reflux disease)    Headache    otc med   HLD (hyperlipidemia)    diet controlled   Hx of migraines    occ    Hypertension    Hypothyroidism    Obesity    Sleep apnea    does not use  cpap   Thrombocytosis 01/26/2020   Type 2 diabetes mellitus ([name])    Type 2    Past Surgical History:  Procedure Laterality Date   ANTERIOR CERVICAL DECOMP/DISCECTOMY FUSION N/A 01/16/2021   Procedure: Cervical five-six Anterior cervical decompression/discectomy/fusion;  Surgeon: Consuella Lose, MD;  Location: [place];  Service: Neurosurgery;  Laterality: N/A;   CARPAL TUNNEL RELEASE Right    CESAREAN SECTION     x2   DILITATION & CURRETTAGE/HYSTROSCOPY WITH NOVASURE ABLATION N/A 12/07/2017   Procedure: DILATATION & CURETTAGE/HYSTEROSCOPY WITH ATTEMPED NOVASURE ABLATION, FAILED/ENDOMETERIAL ABLATION WITH HTA SYSTEM;  Surgeon: Lavonia Drafts, MD;  Location: [facility];  Service: Gynecology;  Laterality: N/A;   ELBOW SURGERY     "surgery for nerve pain"   GANGLION CYST EXCISION Right    right arm    RECTOVAGINAL FISTULA CLOSURE     after vag delivery  TONSILLECTOMY     childhood   TUBAL LIGATION      Current Outpatient Medications on File Prior to Visit  Medication Sig Dispense Refill   Continuous Blood Gluc Receiver (DEXCOM G7 RECEIVER) DEVI Use to check blood sugar three times a day . 1 each 0   Continuous Blood Gluc Sensor (DEXCOM G7 SENSOR) MISC Use to check blood sugar three time daily. Change sensors once every 10 days. 3 each 2   Semaglutide,0.25 or 0.'5MG'$ /DOS, (OZEMPIC, 0.25 OR 0.5 MG/DOSE,) 2 MG/3ML SOPN Inject 0.5 mg into the skin once a week. 3 mL 2   amLODipine (NORVASC) 5 MG tablet Take 1 tablet (5 mg total) by mouth daily. (Patient not taking: Reported on 03/13/2022) 30 tablet 5   atorvastatin (LIPITOR) 80 MG tablet Take 1 tablet (80 mg total) by mouth daily at 6 PM. 30 tablet 5   clindamycin (CLINDAGEL) 1 % gel Apply topically 2 (two) times daily. 30 g 0   Continuous Blood Gluc Transmit (DEXCOM G6 TRANSMITTER) MISC Use to check blood sugar three times daily. 1 each 1   doxycycline  (VIBRAMYCIN) 100 MG capsule Take 1 capsule (100 mg total) by mouth 2 (two) times daily. 20 capsule 0   ferrous sulfate 325 (65 FE) MG tablet Take 1 tablet (325 mg total) by mouth daily with breakfast. 30 tablet 2   Insulin Pen Needle (TRUEPLUS 5-BEVEL PEN NEEDLES) 32G X 4 MM MISC Use to inject Ozempic once a week. 100 each 1   naproxen (NAPROSYN) 500 MG tablet Take 1 tablet (500 mg total) by mouth 2 (two) times daily. 30 tablet 0   potassium chloride (KLOR-CON) 10 MEQ tablet Take 1 tablet (10 mEq total) by mouth daily for 5 days. 5 tablet 0   No current facility-administered medications on file prior to visit.    Allergies  Allergen Reactions   Metformin And Related Diarrhea   Penicillins Other (See Comments)    Unknown; from childhood Has patient had a PCN reaction causing immediate rash, facial/tongue/throat swelling, SOB or lightheadedness with hypotension: Unknow Has patient had a PCN reaction causing severe rash involving mucus membranes or skin necrosis: Unknown Has patient had a PCN reaction that required hospitalization: Unknown Has patient had a PCN reaction occurring within the last 10 years: Unknown If all of the above answers are "NO", then may proceed with Cephalosporin use.    Victoza [Liraglutide] Itching    Social History:  reports that she has been smoking cigarettes. She has been smoking an average of .25 packs per day. She has never used smokeless tobacco. She reports that she does not currently use alcohol. She reports that she does not currently use drugs after having used the following drugs: Marijuana.  Family History  Problem Relation Age of Onset   Thyroid disease Mother    Diabetes Daughter    Diabetes Son    Breast cancer Neg Hx     The following portions of the patient's history were reviewed and updated as appropriate: allergies, current medications, past family history, past medical history, past social history, past surgical history and problem  list.  Review of Systems Pertinent items noted in HPI and remainder of comprehensive ROS otherwise negative.  Physical Exam:  BP 132/87   Pulse 82   Ht '5\' 6"'$  (1.676 m)   Wt 261 lb 11.2 oz (118.7 kg)   LMP 05/18/2022 (Exact Date)   BMI 42.24 kg/m  Physical Exam Vitals and nursing note reviewed.  Constitutional:  Appearance: Normal appearance.  Cardiovascular:     Rate and Rhythm: Normal rate.  Pulmonary:     Effort: Pulmonary effort is normal.     Breath sounds: Normal breath sounds.  Abdominal:     Palpations: Abdomen is soft.  Neurological:     General: No focal deficit present.     Mental Status: She is alert and oriented to person, place, and time.  Psychiatric:        Mood and Affect: Mood normal.        Behavior: Behavior normal.        Thought Content: Thought content normal.        Judgment: Judgment normal.      Assessment and Plan:   1. Abnormal uterine bleeding (AUB) Interval pelvic US to assess size and current structure. Discussed management and plan to proceed with hysterectomy as has failed medical management and immediately failed ablation. Will follow up A1c. Needs to start po iron daily.  - US PELVIC COMPLETE WITH TRANSVAGINAL; Future  2. Dysmenorrhea Trial alternate NSAID - diclofenac no longer covered; iburpofen and naproxen worsen bleeding. Pain only with menses, no non-cyclic pain, anticipate relief with hysterectomy.  - celecoxib (CELEBREX) 200 MG capsule; Take 1 capsule (200 mg total) by mouth daily for 7 days.  Dispense: 7 capsule; Refill: 2  3. Well woman exam Mammo ordered Pap due in 1 year - MM Digital Screening; Future  Patient desires surgical management with RATH, BS, cysto.  The risks of surgery were discussed in detail with the patient including but not limited to: bleeding which may require transfusion or reoperation; infection which may require prolonged hospitalization or re-hospitalization and antibiotic therapy; injury to  bowel, bladder, ureters and major vessels or other surrounding organs which may lead to other procedures; formation of adhesions; need for additional procedures including laparotomy or subsequent procedures secondary to intraoperative injury or abnormal pathology; thromboembolic phenomenon; incisional problems and other postoperative or anesthesia complications.  Patient was told that the likelihood that her condition and symptoms will be treated effectively with this surgical management was very high; the postoperative expectations were also discussed in detail. The patient also understands the alternative treatment options which were discussed in full. All questions were answered.  She was told that she will be contacted by our surgical scheduler regarding the time and date of her surgery; routine preoperative instructions will be given to her by the preoperative nursing team.  Printed patient education handouts about the procedure were given to the patient to review at home.     Routine preventative health maintenance measures emphasized. Please refer to After Visit Summary for other counseling recommendations.   Follow-up: No follow-ups on file.      Darliss Cheney, MD Obstetrician & Gynecologist, [facility] [employer], [place]

## 2022-06-09 ENCOUNTER — Ambulatory Visit
Admission: RE | Admit: 2022-06-09 | Discharge: 2022-06-09 | Disposition: A | Discharge status: Home / Self Care | Payer: Medicaid Other | Source: Ambulatory Visit | Attending: Obstetrics and Gynecology | Admitting: Obstetrics and Gynecology

## 2022-06-09 DIAGNOSIS — D252 Subserosal leiomyoma of uterus: Secondary | ICD-10-CM | POA: Diagnosis not present

## 2022-06-09 DIAGNOSIS — N939 Abnormal uterine and vaginal bleeding, unspecified: Secondary | ICD-10-CM | POA: Insufficient documentation

## 2022-06-10 ENCOUNTER — Telehealth: Payer: Self-pay

## 2022-06-10 ENCOUNTER — Other Ambulatory Visit: Payer: Self-pay

## 2022-06-10 ENCOUNTER — Encounter: Payer: Self-pay | Admitting: Obstetrics and Gynecology

## 2022-06-10 NOTE — Telephone Encounter (Signed)
Per [name], pt needs hysterectomy statement signed.  Notified pt and pt stated that she will be able to come in tomorrow or Friday to sign the document.  I informed pt that the form will be at the front desk for her print her name even though it states to sign to please print.  Pt verbalized understanding with on further questions.  Ruth [name]  06/10/22

## 2022-06-16 ENCOUNTER — Other Ambulatory Visit: Payer: Self-pay

## 2022-06-18 ENCOUNTER — Other Ambulatory Visit: Payer: Self-pay

## 2022-06-19 DIAGNOSIS — L732 Hidradenitis suppurativa: Secondary | ICD-10-CM | POA: Diagnosis not present

## 2022-06-22 ENCOUNTER — Ambulatory Visit: Payer: Medicaid Other | Attending: Family Medicine | Admitting: Pharmacist

## 2022-06-22 ENCOUNTER — Other Ambulatory Visit: Payer: Self-pay

## 2022-06-22 VITALS — Wt 262.0 lb

## 2022-06-22 DIAGNOSIS — E1142 Type 2 diabetes mellitus with diabetic polyneuropathy: Secondary | ICD-10-CM

## 2022-06-22 LAB — POCT GLYCOSYLATED HEMOGLOBIN (HGB A1C): HbA1c, POC (controlled diabetic range): 7.1 % — AB (ref 0.0–7.0)

## 2022-06-22 NOTE — Progress Notes (Signed)
.    S:    No chief complaint on file.  Ruth [name] is a 46 y.o. female who presents for diabetes evaluation, education, and management. PMH is significant for T2DM and HLD. Patient was referred and last seen by Primary Care Provider, Dr. Redmond Pulling, on 03/13/2022. Last seen by pharmacy clinic on 04/21/2022 where Ozempic was increased to 0.5 mg weekly.   Today, patient arrives in good spirits and presents without any assistance. Denies nausea, vomiting, or diarrhea since increasing Ozempic to 0.5 mg weekly. She has been on this dose for ~2 months and is tolerating it well. Over the last year or so she has lost ~100 lbs (staring weight 350 lbs, at home scale shows 253 lbs). Patient says her goal weight is <200 lbs. Of note, A1c is down to 7.1%, which is the lowest A1c in her chart since 2018.   Patient reports Diabetes was diagnosed in 2009-2010.   Family/Social History:  DM- daughter, brother, sister  No family or personal history of thyroid cancer (mother has history of hyperthyroidism requiring radiation).   Current diabetes medications include: Ozempic 0.5 mg weekly  Patient reports adherence to all medications.  Insurance coverage: Medicaid, [employer]  Patient denies polyuria. Patient denies neuropathy (nerve pain). Patient denies visual changes. Patient reports self foot exams.   Dietary habits:  - only eating a 1/4 of her plate.   Exercise:  -None  O:  CGM: Dexcom G7 in place.  Date of Download: 06/22/2022 % Time CGM is active: 99.6%% Average Glucose: 135 mg/dL Glucose Management Indicator: 6.5%  Glucose Variability: 15.3 (goal <36%) Time in Goal:  - Time in range 70-180: 97% - Time above range: 3% - Time below range: 0% Observed patterns: none   Lab Results  Component Value Date   HGBA1C 7.1 (A) 06/22/2022   Lipid Panel     Component Value Date/Time   CHOL 227 (H) 03/13/2022 1020   TRIG 106 03/13/2022 1020   HDL 48 03/13/2022 1020   CHOLHDL 4.7 (H)  03/13/2022 1020   LDLCALC 160 (H) 03/13/2022 1020    Clinical Atherosclerotic Cardiovascular Disease (ASCVD): No  The 10-year ASCVD risk score (Arnett DK, et al., 2019) is: 20.5%   Values used to calculate the score:     Age: 43 years     Sex: Female     Is Non-Hispanic African American: Yes     Diabetic: Yes     Tobacco smoker: Yes     Systolic Blood Pressure: 623 mmHg     Is BP treated: Yes     HDL Cholesterol: 48 mg/dL     Total Cholesterol: 227 mg/dL   Patient is participating in a Managed Medicaid Plan:  Yes   A/P: Diabetes longstanding currently close to goal based on A1c (7.1% today). Congratulated patient on her success! Patient is able to verbalize appropriate hypoglycemia management plan. Given A1c close to goal and patient is continuing to have weight loss on Ozempic 0.5 mg dose, will defer increasing Ozempic today. Can consider in the future if weight loss effects begin to plateau.  -Continue GLP-1 Ozempic (semaglutide) 0.5 mg weekly. -Patient educated on purpose, proper use, and potential adverse effects of Ozempic.  -Extensively discussed pathophysiology of diabetes, recommended lifestyle interventions, dietary effects on blood sugar control.  -Counseled on s/sx of and management of hypoglycemia.  -Next A1c anticipated in 3-6 months.   Follow-up:  Pharmacist: PRN PCP clinic visit in January 2024.   Joseph Art, Pharm.D. PGY-2  Ambulatory Care Pharmacy Resident 06/22/2022 12:19 PM

## 2022-06-25 ENCOUNTER — Other Ambulatory Visit: Payer: Self-pay

## 2022-07-02 ENCOUNTER — Other Ambulatory Visit: Payer: Self-pay | Admitting: Family Medicine

## 2022-07-02 ENCOUNTER — Other Ambulatory Visit: Payer: Self-pay | Admitting: Family

## 2022-07-02 ENCOUNTER — Other Ambulatory Visit: Payer: Self-pay

## 2022-07-02 DIAGNOSIS — E1142 Type 2 diabetes mellitus with diabetic polyneuropathy: Secondary | ICD-10-CM

## 2022-07-02 DIAGNOSIS — E1169 Type 2 diabetes mellitus with other specified complication: Secondary | ICD-10-CM

## 2022-07-02 MED ORDER — OZEMPIC (0.25 OR 0.5 MG/DOSE) 2 MG/3ML ~~LOC~~ SOPN
0.5000 mg | PEN_INJECTOR | SUBCUTANEOUS | 2 refills | Status: DC
  Filled 2022-07-02 – 2022-07-22 (×2): qty 3, 28d supply, fill #0
  Filled 2022-08-19: qty 3, 28d supply, fill #1
  Filled 2022-09-17: qty 3, 28d supply, fill #2
  Filled ????-??-??: fill #0

## 2022-07-02 MED ORDER — DEXCOM G7 SENSOR MISC
2 refills | Status: DC
  Filled 2022-07-02 – 2022-07-03 (×2): qty 3, 30d supply, fill #0
  Filled 2022-07-31: qty 3, 30d supply, fill #1
  Filled 2022-08-31: qty 3, 30d supply, fill #2

## 2022-07-02 NOTE — Telephone Encounter (Signed)
Order complete. 

## 2022-07-02 NOTE — Telephone Encounter (Signed)
Pt called to get refill for Continuous Blood Gluc Sensor ([place]) MISC [[id number]]  Sent to [place] / pt stated this Rx was prescribed by Lurena Joiner at [facility]/ please advise

## 2022-07-03 ENCOUNTER — Encounter: Payer: Medicaid Other | Admitting: Obstetrics and Gynecology

## 2022-07-03 ENCOUNTER — Other Ambulatory Visit: Payer: Self-pay

## 2022-07-12 IMAGING — RF DG C-ARM 1-60 MIN
1 series · 1 of 1 positions shown · non-contrast
Comparison: Cervical spine MRI 12/31/2020.

CLINICAL DATA: Surgery, elective. Additional history provided:
C5-C6 ACDF. Provided fluoroscopy time 7 seconds (2.56 mGy).

EXAM:
CERVICAL SPINE - 2-3 VIEW; DG C-ARM 1-60 MIN

[Series 1: run · 1 of 1 slices shown]
[im 1/1]
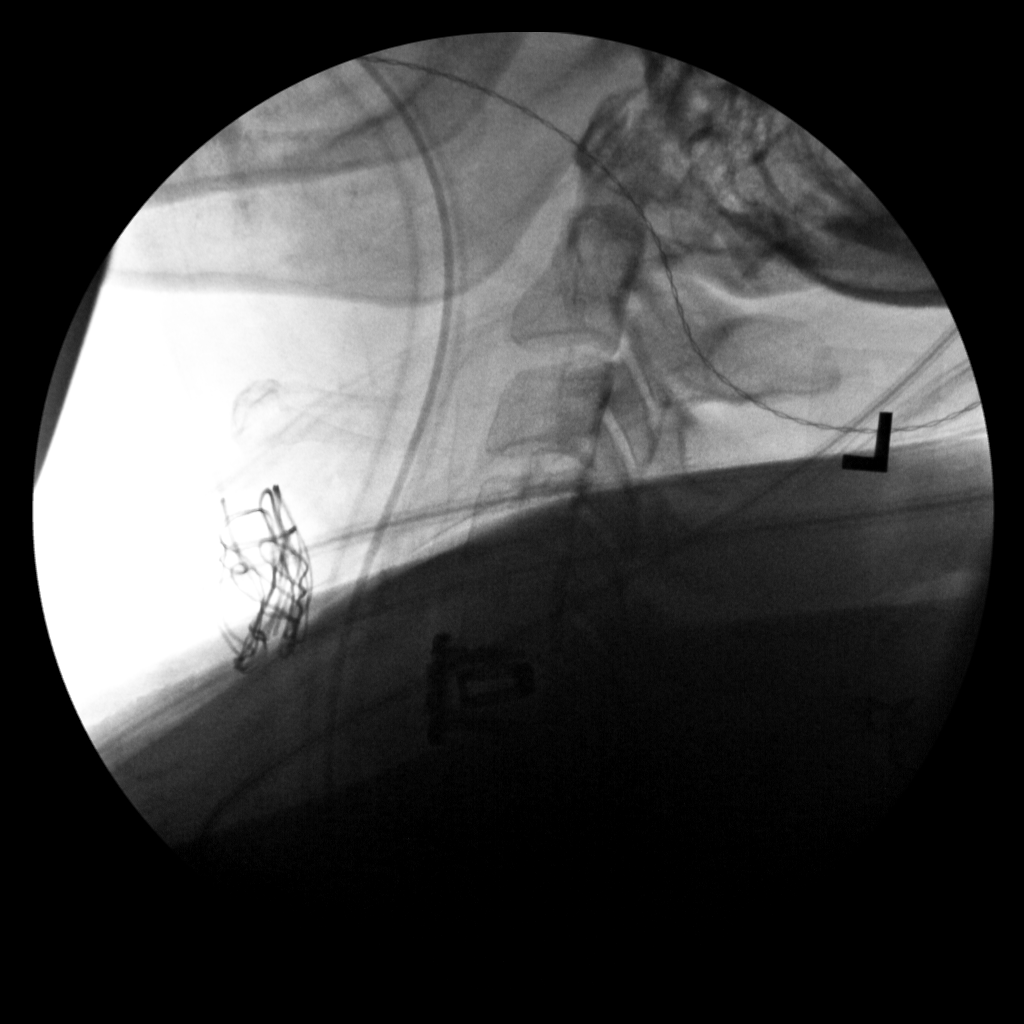

[1 of 1 positions shown; findings below may reference images not displayed]

FINDINGS: A single lateral view intraoperative fluoroscopic image of the
cervical spine is submitted. On the provided image, ACDF hardware is
present at the C5-C6 level (ventral plate and screws as well as
interbody device). Curvilinear hyperdensity projects ventral to the
cervical spine at the C4-C6 levels, likely reflecting surgical
sponges/packing material. Partially visualized ET tube.
IMPRESSION: Single lateral view intraoperative fluoroscopic image of the
cervical spine from C5-C6 ACDF, as described.

Curvilinear hyperdensity projects ventral to the cervical spine at
the C4-C6 levels, likely reflecting surgical sponges/packing
material. Correlate with the operative history.

## 2022-07-12 IMAGING — RF DG CERVICAL SPINE 2 OR 3 VIEWS
1 series · 1 of 1 positions shown · non-contrast
Comparison: Cervical spine MRI 12/31/2020.

CLINICAL DATA: Surgery, elective. Additional history provided:
C5-C6 ACDF. Provided fluoroscopy time 7 seconds (2.56 mGy).

EXAM:
CERVICAL SPINE - 2-3 VIEW; DG C-ARM 1-60 MIN

[Series 1: run · 1 of 1 slices shown]
[im 1/1]
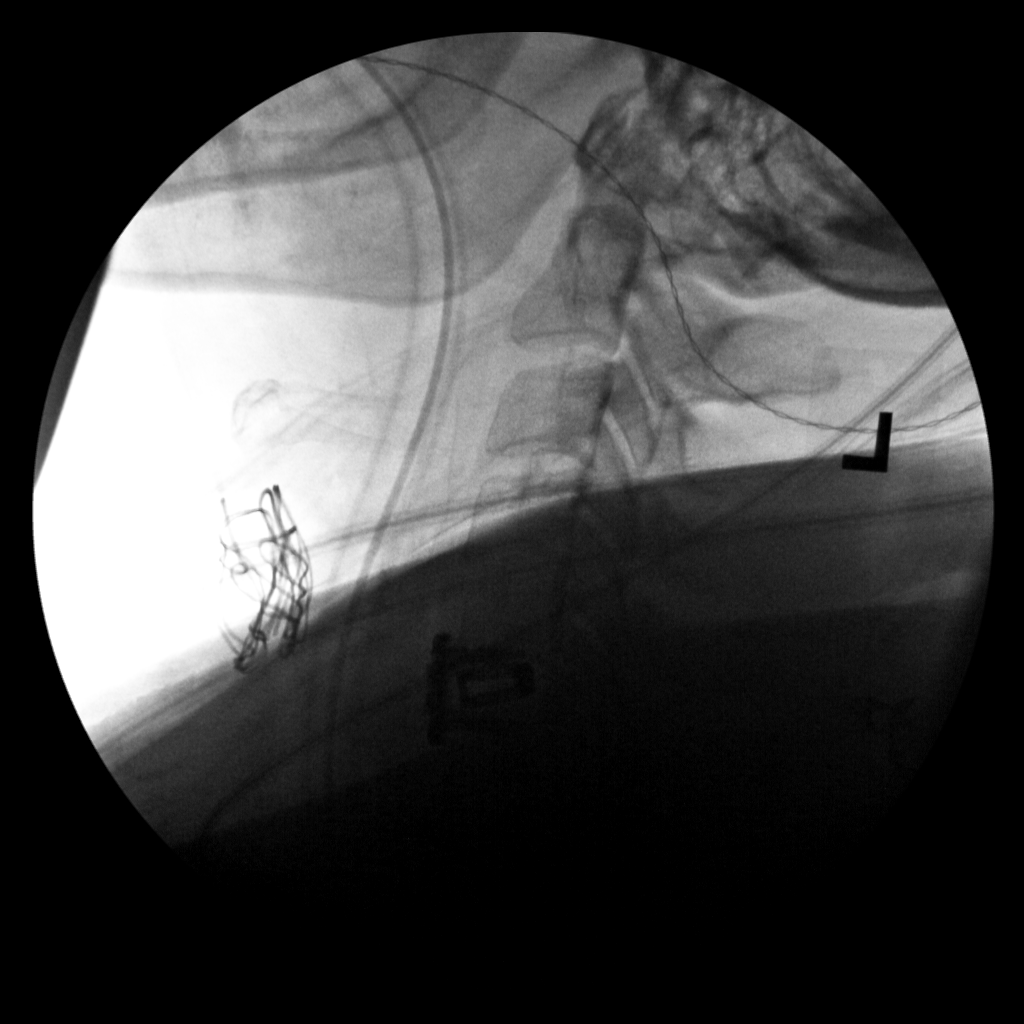

[1 of 1 positions shown; findings below may reference images not displayed]

FINDINGS: A single lateral view intraoperative fluoroscopic image of the
cervical spine is submitted. On the provided image, ACDF hardware is
present at the C5-C6 level (ventral plate and screws as well as
interbody device). Curvilinear hyperdensity projects ventral to the
cervical spine at the C4-C6 levels, likely reflecting surgical
sponges/packing material. Partially visualized ET tube.
IMPRESSION: Single lateral view intraoperative fluoroscopic image of the
cervical spine from C5-C6 ACDF, as described.

Curvilinear hyperdensity projects ventral to the cervical spine at
the C4-C6 levels, likely reflecting surgical sponges/packing
material. Correlate with the operative history.

## 2022-07-13 ENCOUNTER — Encounter: Payer: Self-pay | Admitting: Family Medicine

## 2022-07-13 ENCOUNTER — Ambulatory Visit (INDEPENDENT_AMBULATORY_CARE_PROVIDER_SITE_OTHER): Payer: Medicaid Other | Admitting: Family Medicine

## 2022-07-13 VITALS — BP 118/82 | HR 78 | Temp 98.1°F | Resp 16 | Wt 255.8 lb

## 2022-07-13 DIAGNOSIS — F17200 Nicotine dependence, unspecified, uncomplicated: Secondary | ICD-10-CM | POA: Diagnosis not present

## 2022-07-13 DIAGNOSIS — F3161 Bipolar disorder, current episode mixed, mild: Secondary | ICD-10-CM

## 2022-07-13 DIAGNOSIS — E1169 Type 2 diabetes mellitus with other specified complication: Secondary | ICD-10-CM

## 2022-07-13 MED ORDER — OLANZAPINE 5 MG PO TABS: 5.0000 mg | ORAL_TABLET | Freq: Every day | ORAL | 0 refills | Status: DC

## 2022-07-13 MED ORDER — HYDROXYZINE HCL 25 MG PO TABS: 25.0000 mg | ORAL_TABLET | Freq: Three times a day (TID) | ORAL | 0 refills | Status: DC | PRN

## 2022-07-13 NOTE — Progress Notes (Signed)
Patient is here for their 6 month follow-up Patient has no concerns today Care gaps have been discussed with patient  

## 2022-07-14 ENCOUNTER — Encounter: Payer: Self-pay | Admitting: Family Medicine

## 2022-07-14 NOTE — Progress Notes (Addendum)
Established Patient Office Visit  Subjective    Patient ID: Ruth [name], female    DOB: 16-Apr-1976  Age: 47 y.o. MRN: [id number]  CC:  Chief Complaint  Patient presents with   Follow-up   Diabetes    HPI Ruth [name] presents for follow up of diabetes. Patient also reports history of bipolar and is no longer on meds and having difficulty with her moods.    Outpatient Encounter Medications as of 07/13/2022  Medication Sig   Continuous Blood Gluc Receiver (DEXCOM G7 RECEIVER) DEVI Use to check blood sugar three times a day .   Continuous Blood Gluc Sensor (DEXCOM G7 SENSOR) MISC Use to check blood sugar three time daily. Change sensors once every 10 days.   hydrOXYzine (ATARAX) 25 MG tablet Take 1 tablet (25 mg total) by mouth 3 (three) times daily as needed.   OLANZapine (ZYPREXA) 5 MG tablet Take 1 tablet (5 mg total) by mouth at bedtime.   Semaglutide,0.25 or 0.'5MG'$ /DOS, (OZEMPIC, 0.25 OR 0.5 MG/DOSE,) 2 MG/3ML SOPN Inject 0.5 mg into the skin once a week.   [DISCONTINUED] amLODipine (NORVASC) 5 MG tablet Take 1 tablet (5 mg total) by mouth daily. (Patient not taking: Reported on 03/13/2022)   [DISCONTINUED] atorvastatin (LIPITOR) 80 MG tablet Take 1 tablet (80 mg total) by mouth daily at 6 PM.   [DISCONTINUED] clindamycin (CLINDAGEL) 1 % gel Apply topically 2 (two) times daily.   [DISCONTINUED] Continuous Blood Gluc Transmit (DEXCOM G6 TRANSMITTER) MISC Use to check blood sugar three times daily.   [DISCONTINUED] doxycycline (VIBRAMYCIN) 100 MG capsule Take 1 capsule (100 mg total) by mouth 2 (two) times daily.   [DISCONTINUED] ferrous sulfate 325 (65 FE) MG tablet Take 1 tablet (325 mg total) by mouth daily with breakfast.   [DISCONTINUED] Insulin Pen Needle (TRUEPLUS 5-BEVEL PEN NEEDLES) 32G X 4 MM MISC Use to inject Ozempic once a week.   [DISCONTINUED] naproxen (NAPROSYN) 500 MG tablet Take 1 tablet (500 mg total) by mouth 2 (two) times daily.    [DISCONTINUED] potassium chloride (KLOR-CON) 10 MEQ tablet Take 1 tablet (10 mEq total) by mouth daily for 5 days.   No facility-administered encounter medications on file as of 07/13/2022.    Past Medical History:  Diagnosis Date   Anemia    Anxiety    Arthritis    chronic back pain    Asthma    rarely uses inhaler   Bipolar 1 disorder (HCC)    Fibromyalgia    GERD (gastroesophageal reflux disease)    Headache    otc med   HLD (hyperlipidemia)    diet controlled   Hx of migraines    occ   Hypertension    Hypothyroidism    Obesity    Sleep apnea    does not use  cpap   Thrombocytosis 01/26/2020   Type 2 diabetes mellitus ([place])    Type 2    Past Surgical History:  Procedure Laterality Date   ANTERIOR CERVICAL DECOMP/DISCECTOMY FUSION N/A 01/16/2021   Procedure: Cervical five-six Anterior cervical decompression/discectomy/fusion;  Surgeon: Consuella Lose, MD;  Location: [place];  Service: Neurosurgery;  Laterality: N/A;   CARPAL TUNNEL RELEASE Right    CESAREAN SECTION     x2   DILITATION & CURRETTAGE/HYSTROSCOPY WITH NOVASURE ABLATION N/A 12/07/2017   Procedure: DILATATION & CURETTAGE/HYSTEROSCOPY WITH ATTEMPED NOVASURE ABLATION, FAILED/ENDOMETERIAL ABLATION WITH HTA SYSTEM;  Surgeon: Lavonia Drafts, MD;  Location: [facility];  Service: Gynecology;  Laterality: N/A;  ELBOW SURGERY     "surgery for nerve pain"   GANGLION CYST EXCISION Right    right arm    RECTOVAGINAL FISTULA CLOSURE     after vag delivery   TONSILLECTOMY     childhood   TUBAL LIGATION      Family History  Problem Relation Age of Onset   Thyroid disease Mother    Diabetes Daughter    Diabetes Son    Breast cancer Neg Hx     Social History   Socioeconomic History   Marital status: Married    Spouse name: Not on file   Number of children: Not on file   Years of education: Not on file   Highest education level: Not on file  Occupational History   Not on file  Tobacco Use   Smoking  status: Every Day    Packs/day: 0.25    Types: Cigarettes   Smokeless tobacco: Never  Vaping Use   Vaping Use: Never used  Substance and Sexual Activity   Alcohol use: Not Currently   Drug use: Not Currently    Types: Marijuana   Sexual activity: Not Currently    Birth control/protection: Surgical    Comment: tubal ligation  Other Topics Concern   Not on file  Social History Narrative   Not on file   Social Determinants of Health   Financial Resource Strain: Not on file  Food Insecurity: Not on file  Transportation Needs: Not on file  Physical Activity: Not on file  Stress: Not on file  Social Connections: Not on file  Intimate Partner Violence: Not on file    Review of Systems  Psychiatric/Behavioral:  Positive for depression. Negative for suicidal ideas. The patient is nervous/anxious and has insomnia.   All other systems reviewed and are negative.       Objective    BP 118/82   Pulse 78   Temp 98.1 F (36.7 C) (Oral)   Resp 16   Wt 255 lb 12.8 oz (116 kg)   SpO2 98%   BMI 41.29 kg/m   Physical Exam Vitals and nursing note reviewed.  Constitutional:      General: She is not in acute distress. Cardiovascular:     Rate and Rhythm: Normal rate and regular rhythm.  Pulmonary:     Effort: Pulmonary effort is normal.     Breath sounds: Normal breath sounds.  Abdominal:     Palpations: Abdomen is soft.     Tenderness: There is no abdominal tenderness.  Neurological:     General: No focal deficit present.     Mental Status: She is alert and oriented to person, place, and time.  Psychiatric:        Mood and Affect: Mood is anxious. Affect is labile.         Assessment & Plan:   1. Type 2 diabetes mellitus with other specified complication, without long-term current use of insulin (HCC) Improved A1c but not at goal. Continue and monitor - POCT glycosylated hemoglobin (Hb A1C)  2. Bipolar disorder, current episode mixed, mild (HCC) Prescribed  xyprexa 5 mg and monitor. Hydroxyzine prescribed for anxiety.   3. Smoking Discussed cessation/reduction    Return in about 4 weeks (around 08/10/2022) for follow up.   Becky Sax, MD

## 2022-07-22 ENCOUNTER — Other Ambulatory Visit: Payer: Self-pay

## 2022-07-23 ENCOUNTER — Ambulatory Visit (INDEPENDENT_AMBULATORY_CARE_PROVIDER_SITE_OTHER): Payer: Medicaid Other | Admitting: Obstetrics and Gynecology

## 2022-07-23 ENCOUNTER — Other Ambulatory Visit (HOSPITAL_COMMUNITY)
Admission: RE | Admit: 2022-07-23 | Discharge: 2022-07-23 | Disposition: A | Discharge status: Home / Self Care | Payer: Medicaid Other | Source: Ambulatory Visit | Attending: Obstetrics and Gynecology | Admitting: Obstetrics and Gynecology

## 2022-07-23 ENCOUNTER — Encounter: Payer: Self-pay | Admitting: Obstetrics and Gynecology

## 2022-07-23 VITALS — BP 153/84 | HR 78 | Ht 66.0 in | Wt 263.3 lb

## 2022-07-23 DIAGNOSIS — N939 Abnormal uterine and vaginal bleeding, unspecified: Secondary | ICD-10-CM | POA: Insufficient documentation

## 2022-07-23 DIAGNOSIS — Z3202 Encounter for pregnancy test, result negative: Secondary | ICD-10-CM

## 2022-07-23 HISTORY — PX: ENDOMETRIAL BIOPSY: SHX622

## 2022-07-23 LAB — POCT PREGNANCY, URINE: Preg Test, Ur: NEGATIVE

## 2022-07-23 NOTE — Progress Notes (Signed)
GYNECOLOGY VISIT  Patient name: Ruth [name] MRN [id number]  Date of birth: October 05, 1975 Chief Complaint:   Procedure  History:  Ruth [name] is a 47 y.o. [id number] being seen today for preop EMB.    Past Medical History:  Diagnosis Date   Anemia    Anxiety    Arthritis    chronic back pain    Asthma    rarely uses inhaler   Bipolar 1 disorder (HCC)    Fibromyalgia    GERD (gastroesophageal reflux disease)    Headache    otc med   HLD (hyperlipidemia)    diet controlled   Hx of migraines    occ   Hypertension    Hypothyroidism    Obesity    Sleep apnea    does not use  cpap   Thrombocytosis 01/26/2020   Type 2 diabetes mellitus ([place])    Type 2    Past Surgical History:  Procedure Laterality Date   ANTERIOR CERVICAL DECOMP/DISCECTOMY FUSION N/A 01/16/2021   Procedure: Cervical five-six Anterior cervical decompression/discectomy/fusion;  Surgeon: Consuella Lose, MD;  Location: [facility];  Service: Neurosurgery;  Laterality: N/A;   CARPAL TUNNEL RELEASE Right    CESAREAN SECTION     x2   DILITATION & CURRETTAGE/HYSTROSCOPY WITH NOVASURE ABLATION N/A 12/07/2017   Procedure: DILATATION & CURETTAGE/HYSTEROSCOPY WITH ATTEMPED NOVASURE ABLATION, FAILED/ENDOMETERIAL ABLATION WITH HTA SYSTEM;  Surgeon: Lavonia Drafts, MD;  Location: [facility];  Service: Gynecology;  Laterality: N/A;   ELBOW SURGERY     "surgery for nerve pain"   GANGLION CYST EXCISION Right    right arm    RECTOVAGINAL FISTULA CLOSURE     after vag delivery   TONSILLECTOMY     childhood   TUBAL LIGATION      The following portions of the patient's history were reviewed and updated as appropriate: allergies, current medications, past family history, past medical history, past social history, past surgical history and problem list.      Component Value Date/Time   DIAGPAP  10/04/2017 0000    NEGATIVE FOR INTRAEPITHELIAL LESIONS OR MALIGNANCY.   ADEQPAP  10/04/2017 0000     Satisfactory for evaluation  endocervical/transformation zone component PRESENT.     Review of Systems:  Pertinent items are noted in HPI. Comprehensive review of systems was otherwise negative.   Objective:  Physical Exam BP (!) 153/84   Pulse 78   Ht '5\' 6"'$  (1.676 m)   Wt 263 lb 4.8 oz (119.4 kg)   BMI 42.50 kg/m    Physical Exam Genitourinary:    Comments: Normal appearing multiparous cervix Sounded to 10+ with EMB pipelle Slight stenosis that was bipased with pipelle    Endometrial Biopsy Procedure  Patient identified, informed consent performed,  indication reviewed, consent signed.  Reviewed risk of perforation, pain, bleeding, insufficient sample, etc were reviewd. Time out was performed.  Urine pregnancy test negative.  Speculum placed in the vagina.  Cervix visualized.  Cleaned with Betadine x 2.  Anterior cervix infiltrated with 0.5cc of 1% lidocaine. Grasped anteriorly with a single tooth tenaculum.  Paracervical block was administered and the endocervical canal instilled with using 1% lidocaine.  Endometrial pipelle was used to draw up 1cc of 1% lidocaine, introduced into the cervical os and instilled into the endometrial cavity.  The pipelle was passed twice and sample obtained. Tenaculum was removed, good hemostasis noted.  Patient tolerated procedure well.  Patient was given post-procedure instructions.   Labs and Imaging  No results found.     Assessment & Plan:   1. Abnormal uterine bleeding (AUB) Now s/p EMB for preop testing. A1c much improved and reasonable to proceed with procedure.  - Surgical pathology  Patient desires surgical management with RA-TLH,B S, cysto.  The risks of surgery were discussed in detail with the patient including but not limited to: bleeding which may require transfusion or reoperation; infection which may require prolonged hospitalization or re-hospitalization and antibiotic therapy; injury to bowel, bladder, ureters and major  vessels or other surrounding organs which may lead to other procedures; formation of adhesions; need for additional procedures including laparotomy or subsequent procedures secondary to intraoperative injury or abnormal pathology; thromboembolic phenomenon; incisional problems and other postoperative or anesthesia complications.  Patient was told that the likelihood that her condition and symptoms will be treated effectively with this surgical management was very high; the postoperative expectations were also discussed in detail. The patient also understands the alternative treatment options which were discussed in full. All questions were answered.  She was told that she will be contacted by our surgical scheduler regarding the time and date of her surgery; routine preoperative instructions will be given to her by the preoperative nursing team.  Printed patient education handouts about the procedure were given to the patient to review at home.    Routine preventative health maintenance measures emphasized.  Darliss Cheney, MD [facility] [place]

## 2022-07-23 NOTE — H&P (View-Only) (Signed)
GYNECOLOGY VISIT  Patient name: Ruth [name] MRN [id number]  Date of birth: January 04, 1976 Chief Complaint:   Procedure  History:  Ruth [name] is a 47 y.o. [id number] being seen today for preop EMB.    Past Medical History:  Diagnosis Date   Anemia    Anxiety    Arthritis    chronic back pain    Asthma    rarely uses inhaler   Bipolar 1 disorder (HCC)    Fibromyalgia    GERD (gastroesophageal reflux disease)    Headache    otc med   HLD (hyperlipidemia)    diet controlled   Hx of migraines    occ   Hypertension    Hypothyroidism    Obesity    Sleep apnea    does not use  cpap   Thrombocytosis 01/26/2020   Type 2 diabetes mellitus ([place])    Type 2    Past Surgical History:  Procedure Laterality Date   ANTERIOR CERVICAL DECOMP/DISCECTOMY FUSION N/A 01/16/2021   Procedure: Cervical five-six Anterior cervical decompression/discectomy/fusion;  Surgeon: Consuella Lose, MD;  Location: [facility];  Service: Neurosurgery;  Laterality: N/A;   CARPAL TUNNEL RELEASE Right    CESAREAN SECTION     x2   DILITATION & CURRETTAGE/HYSTROSCOPY WITH NOVASURE ABLATION N/A 12/07/2017   Procedure: DILATATION & CURETTAGE/HYSTEROSCOPY WITH ATTEMPED NOVASURE ABLATION, FAILED/ENDOMETERIAL ABLATION WITH HTA SYSTEM;  Surgeon: Lavonia Drafts, MD;  Location: [facility];  Service: Gynecology;  Laterality: N/A;   ELBOW SURGERY     "surgery for nerve pain"   GANGLION CYST EXCISION Right    right arm    RECTOVAGINAL FISTULA CLOSURE     after vag delivery   TONSILLECTOMY     childhood   TUBAL LIGATION      The following portions of the patient's history were reviewed and updated as appropriate: allergies, current medications, past family history, past medical history, past social history, past surgical history and problem list.      Component Value Date/Time   DIAGPAP  10/04/2017 0000    NEGATIVE FOR INTRAEPITHELIAL LESIONS OR MALIGNANCY.   ADEQPAP  10/04/2017 0000     Satisfactory for evaluation  endocervical/transformation zone component PRESENT.     Review of Systems:  Pertinent items are noted in HPI. Comprehensive review of systems was otherwise negative.   Objective:  Physical Exam BP (!) 153/84   Pulse 78   Ht '5\' 6"'$  (1.676 m)   Wt 263 lb 4.8 oz (119.4 kg)   BMI 42.50 kg/m    Physical Exam Genitourinary:    Comments: Normal appearing multiparous cervix Sounded to 10+ with EMB pipelle Slight stenosis that was bipased with pipelle    Endometrial Biopsy Procedure  Patient identified, informed consent performed,  indication reviewed, consent signed.  Reviewed risk of perforation, pain, bleeding, insufficient sample, etc were reviewd. Time out was performed.  Urine pregnancy test negative.  Speculum placed in the vagina.  Cervix visualized.  Cleaned with Betadine x 2.  Anterior cervix infiltrated with 0.5cc of 1% lidocaine. Grasped anteriorly with a single tooth tenaculum.  Paracervical block was administered and the endocervical canal instilled with using 1% lidocaine.  Endometrial pipelle was used to draw up 1cc of 1% lidocaine, introduced into the cervical os and instilled into the endometrial cavity.  The pipelle was passed twice and sample obtained. Tenaculum was removed, good hemostasis noted.  Patient tolerated procedure well.  Patient was given post-procedure instructions.   Labs and Imaging  No results found.     Assessment & Plan:   1. Abnormal uterine bleeding (AUB) Now s/p EMB for preop testing. A1c much improved and reasonable to proceed with procedure.  - Surgical pathology  Patient desires surgical management with RA-TLH,B S, cysto.  The risks of surgery were discussed in detail with the patient including but not limited to: bleeding which may require transfusion or reoperation; infection which may require prolonged hospitalization or re-hospitalization and antibiotic therapy; injury to bowel, bladder, ureters and major  vessels or other surrounding organs which may lead to other procedures; formation of adhesions; need for additional procedures including laparotomy or subsequent procedures secondary to intraoperative injury or abnormal pathology; thromboembolic phenomenon; incisional problems and other postoperative or anesthesia complications.  Patient was told that the likelihood that her condition and symptoms will be treated effectively with this surgical management was very high; the postoperative expectations were also discussed in detail. The patient also understands the alternative treatment options which were discussed in full. All questions were answered.  She was told that she will be contacted by our surgical scheduler regarding the time and date of her surgery; routine preoperative instructions will be given to her by the preoperative nursing team.  Printed patient education handouts about the procedure were given to the patient to review at home.    Routine preventative health maintenance measures emphasized.  Darliss Cheney, MD [facility] [place]

## 2022-07-27 LAB — SURGICAL PATHOLOGY

## 2022-07-29 ENCOUNTER — Encounter (HOSPITAL_COMMUNITY): Payer: Self-pay | Admitting: Emergency Medicine

## 2022-07-29 ENCOUNTER — Ambulatory Visit (HOSPITAL_COMMUNITY)
Admission: EM | Admit: 2022-07-29 | Discharge: 2022-07-29 | Disposition: A | Discharge status: Home / Self Care | Payer: Medicaid Other | Attending: Family Medicine | Admitting: Family Medicine

## 2022-07-29 DIAGNOSIS — L02412 Cutaneous abscess of left axilla: Secondary | ICD-10-CM | POA: Diagnosis not present

## 2022-07-29 MED ORDER — LIDOCAINE HCL (PF) 2 % IJ SOLN
INTRAMUSCULAR | Status: AC
  Filled 2022-07-29: qty 5

## 2022-07-29 MED ORDER — PENTAFLUOROPROP-TETRAFLUOROETH EX AERO
INHALATION_SPRAY | CUTANEOUS | Status: AC
  Filled 2022-07-29: qty 30

## 2022-07-29 MED ORDER — CLINDAMYCIN HCL 300 MG PO CAPS: 300.0000 mg | ORAL_CAPSULE | Freq: Three times a day (TID) | ORAL | 0 refills | Status: AC

## 2022-07-29 MED ORDER — TRAMADOL HCL 50 MG PO TABS: 50.0000 mg | ORAL_TABLET | Freq: Four times a day (QID) | ORAL | 0 refills | Status: DC | PRN

## 2022-07-29 MED ORDER — KETOROLAC TROMETHAMINE 30 MG/ML IJ SOLN
30.0000 mg | Freq: Once | INTRAMUSCULAR | Status: AC
  Administered 2022-07-29: 30 mg via INTRAMUSCULAR

## 2022-07-29 MED ORDER — KETOROLAC TROMETHAMINE 30 MG/ML IJ SOLN
INTRAMUSCULAR | Status: AC
  Filled 2022-07-29: qty 1

## 2022-07-29 NOTE — Discharge Instructions (Addendum)
Clean the area 2 times daily with soapy water or peroxide.  --Take clindamycin 300 mg-- 1 capsule 3 times daily for 7 days  Take tramadol 50 mg-- 1 tablet every 6 hours as needed for pain.  This medication can make you sleepy or dizzy  Let your dermatologist know that I have prescribed clindamycin as an alternative to the minocycline for 7 days

## 2022-07-29 NOTE — ED Triage Notes (Signed)
Pt c/o boil in left axilla for 2 days. Reports very painful. Denies drainage.

## 2022-07-29 NOTE — ED Provider Notes (Signed)
[facility]    CSN: [id number] Arrival date & time: 07/29/22  1442      History   Chief Complaint Chief Complaint  Patient presents with   Abscess    HPI Ruth [name] is a 47 y.o. female.    Abscess  Here for swelling and soreness in her left axilla.  She is undergoing treatment for hidradenitis with a dermatologist and they just prescribed her minocycline beginning on January 8.  This is to be a chronic condition.  She began having swelling and pain that has worsened in the last day.  It actually began bothering her about 2 days ago.  No fever or chills.  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   BMI 45.0-49.9, adult ([place]) 09/04/2020   Right arm pain 02/14/2020   Hyperlipidemia associated with type 2 diabetes mellitus ([place]) 09/19/2019   Abnormal uterine bleeding (AUB) 12/07/2017   Abnormal mammogram of both breasts 11/02/2016   Hypothyroidism 06/21/2009   Type 2 diabetes mellitus with peripheral neuropathy ([place]) 06/21/2009   BIPOLAR AFFECTIVE DISORDER 06/21/2009   PERSISTENT DISORDER INITIATING/MAINTAINING SLEEP 06/21/2009   INADEQUATE SLEEP HYGIENE 06/21/2009   OBSTRUCTIVE SLEEP APNEA 06/21/2009   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   CARPAL TUNNEL SYNDROME 06/20/2009   MERALGIA PARESTHETICA 06/20/2009   Essential hypertension 06/20/2009   FIBROMYALGIA 06/20/2009   CEPHALGIA  06/20/2009    Past Surgical History:  Procedure Laterality Date   ANTERIOR CERVICAL DECOMP/DISCECTOMY FUSION N/A 01/16/2021   Procedure: Cervical five-six Anterior cervical decompression/discectomy/fusion;  Surgeon: Consuella Lose, MD;  Location: [facility];  Service: Neurosurgery;  Laterality: N/A;   CARPAL TUNNEL RELEASE Right    CESAREAN SECTION     x2   DILITATION & CURRETTAGE/HYSTROSCOPY WITH NOVASURE ABLATION N/A 12/07/2017   Procedure: DILATATION & CURETTAGE/HYSTEROSCOPY WITH ATTEMPED NOVASURE ABLATION, FAILED/ENDOMETERIAL ABLATION WITH HTA SYSTEM;  Surgeon: Lavonia Drafts, MD;  Location[facility];  Service: Gynecology;  Laterality: N/A;   ELBOW SURGERY     "surgery for nerve pain"   GANGLION CYST EXCISION Right    right arm    RECTOVAGINAL FISTULA CLOSURE     after vag delivery   TONSILLECTOMY     childhood   TUBAL LIGATION      OB History     Gravida  7   Para  5   Term  4   Preterm  1   AB  2   Living  5      SAB  2   IAB      Ectopic      Multiple      Live Births  5        Obstetric Comments  Vag x 2>c/s emergency>vbac>c/s and BTL 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 Authorizing Provider  clindamycin (CLEOCIN) 300 MG capsule Take 1 capsule (300 mg total) by mouth 3 (three) times daily for 5 days. 07/29/22 08/03/22 Yes Zyden Suman, Gwenlyn Perking, MD  traMADol (ULTRAM) 50 MG tablet Take 1 tablet (50 mg total) by mouth every 6 (six) hours as needed (pain). 07/29/22  Yes Barrett Henle, MD  Continuous Blood Gluc Receiver (DEXCOM G7 RECEIVER) DEVI Use to check blood sugar three times a day . 04/08/22   Charlott Rakes, MD  Continuous Blood Gluc Sensor (DEXCOM G7 SENSOR) MISC Use to check blood sugar three time daily. Change sensors once every 10 days. 07/02/22   Camillia Herter, NP  hydrOXYzine (ATARAX) 25 MG tablet Take 1 tablet  (25 mg total) by mouth 3 (three) times daily as needed. 07/13/22   Dorna Mai, MD  OLANZapine (ZYPREXA) 5 MG tablet Take 1 tablet (5 mg total) by mouth at bedtime. 07/13/22   Dorna Mai, MD  Semaglutide,0.25 or 0.'5MG'$ /DOS, (OZEMPIC, 0.25 OR 0.5 MG/DOSE,) 2 MG/3ML SOPN Inject 0.5 mg into the skin once a week. 07/02/22   Charlott Rakes, MD    Family History Family History  Problem Relation Age of Onset   Thyroid disease Mother    Diabetes Daughter    Diabetes Son    Breast cancer Neg Hx     Social History Social History   Tobacco Use   Smoking status: Every Day    Packs/day: 0.25    Types: Cigarettes   Smokeless tobacco: Never  Vaping Use   Vaping Use: Never used  Substance Use Topics   Alcohol use: Not Currently   Drug use: Not Currently    Types: Marijuana     Allergies   Metformin and related, Penicillins, and Victoza [liraglutide]   Review of Systems Review of Systems   Physical Exam Triage Vital Signs ED Triage Vitals  Enc Vitals Group     BP 07/29/22 1534 (!) 144/84     Pulse Rate 07/29/22 1534 (!) 42     Resp 07/29/22 1534 17     Temp 07/29/22 1534 98 F (36.7 C)     Temp src --      SpO2 07/29/22 1534 98 %     Weight --      Height --      Head Circumference --      Peak Flow --      Pain Score 07/29/22 1533 10     Pain Loc --      Pain Edu? --      Excl. in [place]? --    No data found.  Updated Vital Signs BP (!) 144/84 (BP Location: Left Arm)   Pulse (!) 42   Temp 98 F (36.7 C)   Resp 17   LMP 07/05/2022   SpO2 98%   Visual Acuity Right Eye Distance:   Left Eye Distance:   Bilateral Distance:    Right Eye Near:   Left Eye Near:    Bilateral Near:     Physical Exam Vitals reviewed.  Constitutional:      General: She is not in acute distress.    Appearance: She is not ill-appearing, toxic-appearing or diaphoretic.  HENT:     Mouth/Throat:  Mouth: Mucous membranes are moist.  Cardiovascular:     Rate and Rhythm: Normal  rate and regular rhythm.     Heart sounds: No murmur heard. Pulmonary:     Effort: Pulmonary effort is normal.     Breath sounds: Normal breath sounds.  Skin:    Coloration: Skin is not jaundiced or pale.     Comments: In her left axilla is an area of induration 4 cm x 3 cm.  There are 2 areas of fluctuance.  Neurological:     Mental Status: She is alert.      UC Treatments / Results  Labs (all labs ordered are listed, but only abnormal results are displayed) Labs Reviewed - No data to display  EKG   Radiology No results found.  Procedures Procedures (including critical care time)  Medications Ordered in UC Medications  ketorolac (TORADOL) 30 MG/ML injection 30 mg (has no administration in time range)    Initial Impression / Assessment and Plan / UC Course  I have reviewed the triage vital signs and the nursing notes.  Pertinent labs & imaging results that were available during my care of the patient were reviewed by me and considered in my medical decision making (see chart for details).        After discussing risks and benefits, verbal consent is obtained.  2% lidocaine plain is used to inject infiltration anesthesia in the middle of the induration where the fluctuance is.  First dermal spray was used to aid in anesthesia.  Under clean conditions the more inferior area of fluctuance was incised first.  A tiny amount of pus and then some blood escaped.  The more superior area of fluctuance was then anesthetized with a lidocaine as there is been a dot of pus that it emerged with pressure of the lesion.  The #11 blade was used to incise that and a tiny amount of pus and blood came out of that but no more.  EBL is about 2 mL.  No complications.  Wound care is explained; clindamycin is sent for alternative antibiotic to the minocycline for 7 days Final Clinical Impressions(s) / UC Diagnoses   Final diagnoses:  Abscess of left axilla     Discharge Instructions       Clean the area 2 times daily with soapy water or peroxide.  --Take clindamycin 300 mg-- 1 capsule 3 times daily for 7 days  Take tramadol 50 mg-- 1 tablet every 6 hours as needed for pain.  This medication can make you sleepy or dizzy  Let your dermatologist know that I have prescribed clindamycin as an alternative to the minocycline for 7 days     ED Prescriptions     Medication Sig Dispense Auth. Provider   clindamycin (CLEOCIN) 300 MG capsule Take 1 capsule (300 mg total) by mouth 3 (three) times daily for 5 days. 15 capsule Karinne Schmader, Gwenlyn Perking, MD   traMADol (ULTRAM) 50 MG tablet Take 1 tablet (50 mg total) by mouth every 6 (six) hours as needed (pain). 12 tablet Randall Rampersad, Gwenlyn Perking, MD      I have reviewed the PDMP during this encounter.   Barrett Henle, MD 07/29/22 [phone]

## 2022-07-31 ENCOUNTER — Encounter (HOSPITAL_BASED_OUTPATIENT_CLINIC_OR_DEPARTMENT_OTHER): Payer: Self-pay | Admitting: Obstetrics and Gynecology

## 2022-07-31 ENCOUNTER — Other Ambulatory Visit: Payer: Self-pay

## 2022-08-03 ENCOUNTER — Encounter (HOSPITAL_BASED_OUTPATIENT_CLINIC_OR_DEPARTMENT_OTHER): Payer: Self-pay | Admitting: Obstetrics and Gynecology

## 2022-08-03 ENCOUNTER — Other Ambulatory Visit: Payer: Self-pay

## 2022-08-03 DIAGNOSIS — N939 Abnormal uterine and vaginal bleeding, unspecified: Secondary | ICD-10-CM | POA: Diagnosis not present

## 2022-08-03 DIAGNOSIS — Z01812 Encounter for preprocedural laboratory examination: Secondary | ICD-10-CM | POA: Diagnosis present

## 2022-08-03 NOTE — Progress Notes (Addendum)
Your procedure is scheduled on Wednesday, 08/12/2022.  Report to [facility].   Call this number if you have problems the morning of surgery  :[phone].   OUR ADDRESS IS [place].  WE ARE LOCATED IN [facility].  PLEASE BRING YOUR INSURANCE CARD AND PHOTO ID DAY OF SURGERY.  ONLY 2 PEOPLE ARE ALLOWED IN  WAITING  ROOM.                                      REMEMBER:  DO NOT EAT FOOD, CANDY GUM OR MINTS  AFTER MIDNIGHT THE NIGHT BEFORE YOUR SURGERY . YOU MAY HAVE CLEAR LIQUIDS FROM MIDNIGHT THE NIGHT BEFORE YOUR SURGERY UNTIL  5:30 AM. NO CLEAR LIQUIDS AFTER   5:30 AM DAY OF SURGERY.  YOU MAY  BRUSH YOUR TEETH MORNING OF SURGERY AND RINSE YOUR MOUTH OUT, NO CHEWING GUM CANDY OR MINTS.     CLEAR LIQUID DIET   Foods Allowed                                                                     Foods Excluded  Coffee and tea, regular and decaf                             liquids that you cannot  Plain Jell-O                                                                   see through such as: Fruit ices (not with fruit pulp)                                     milk, soups, orange juice  Plain  Popsicles                                    All solid food Carbonated beverages, regular and diet                                    Cranberry, grape and apple juices Sports drinks like Gatorade _____________________________________________________________________     TAKE ONLY THESE MEDICATIONS MORNING OF SURGERY: Atarax if needed, Tramadol if needed, Albuterol inhaler if needed  Please bring Albuterol inhaler with you on the day of surgery.  Do not take Ozempic for 7 days prior to surgery. Last dose before surgery should be on 08/04/22.    UP TO 4 VISITORS  MAY VISIT IN THE EXTENDED RECOVERY ROOM UNTIL 800 PM ONLY.  ONE  VISITOR AGE 64 AND OVER MAY SPEND THE NIGHT AND MUST BE  IN EXTENDED RECOVERY ROOM NO LATER THAN 800 PM .  YOUR DISCHARGE TIME AFTER YOU SPEND THE NIGHT IS 900 AM THE MORNING AFTER YOUR SURGERY.  YOU MAY PACK A SMALL OVERNIGHT BAG WITH TOILETRIES FOR YOUR OVERNIGHT STAY IF YOU WISH.  YOUR PRESCRIPTION MEDICATIONS WILL BE PROVIDED DURING [place].                                      DO NOT WEAR JEWERLY, BODY PIERCINGS, MAKE UP. DO NOT WEAR LOTIONS, POWDERS, PERFUMES OR NAIL POLISH ON YOUR FINGERNAILS. TOENAIL POLISH IS OK TO WEAR. DO NOT SHAVE FOR 48 HOURS PRIOR TO DAY OF SURGERY. MEN MAY SHAVE FACE AND NECK. CONTACTS, GLASSES, OR DENTURES MAY NOT BE WORN TO SURGERY.  REMEMBER: NO SMOKING, DRUGS OR ALCOHOL FOR 24 HOURS BEFORE YOUR SURGERY.                                    [facility] IS NOT RESPONSIBLE  FOR ANY BELONGINGS.                                                                    Marland Kitchen           [name] - Preparing for Surgery Before surgery, you can play an important role.  Because skin is not sterile, your skin needs to be as free of germs as possible.  You can reduce the number of germs on your skin by washing with CHG (chlorahexidine gluconate) soap before surgery.  CHG is an antiseptic cleaner which kills germs and bonds with the skin to continue killing germs even after washing. Please DO NOT use if you have an allergy to CHG or antibacterial soaps.  If your skin becomes reddened/irritated stop using the CHG and inform your nurse when you arrive at Short Stay. Do not shave (including legs and underarms) for at least 48 hours prior to the first CHG shower.  You may shave your face/neck. Please follow these instructions carefully:  1.  Shower with CHG Soap the night before surgery and the  morning of Surgery.  2.  If you choose to wash your hair, wash your hair first as usual with your  normal  shampoo.  3.  After you shampoo, rinse your hair and body thoroughly to remove the  shampoo.                                        4.  Use CHG as you would any other liquid soap.   You can apply chg directly  to the skin and wash , chg soap provided, night before and morning of your surgery.  5.  Apply the CHG Soap to your body ONLY FROM THE NECK DOWN.   Do not use on face/ open                           Wound or open sores. Avoid contact  with eyes, ears mouth and genitals (private parts).                       Wash face,  Genitals (private parts) with your normal soap.             6.  Wash thoroughly, paying special attention to the area where your surgery  will be performed.  7.  Thoroughly rinse your body with warm water from the neck down.  8.  DO NOT shower/wash with your normal soap after using and rinsing off  the CHG Soap.             9.  Pat yourself dry with a clean towel.            10.  Wear clean pajamas.            11.  Place clean sheets on your bed the night of your first shower and do not  sleep with pets. Day of Surgery : Do not apply any lotions/deodorants the morning of surgery.  Please wear clean clothes to the hospital/surgery center.  IF YOU HAVE ANY SKIN IRRITATION OR PROBLEMS WITH THE SURGICAL SOAP, PLEASE GET A BAR OF GOLD DIAL SOAP AND SHOWER THE NIGHT BEFORE YOUR SURGERY AND THE MORNING OF YOUR SURGERY. PLEASE LET THE NURSE KNOW MORNING OF YOUR SURGERY IF YOU HAD ANY PROBLEMS WITH THE SURGICAL SOAP.   ________________________________________________________________________                                                        QUESTIONS [facility] PRE OP NURSE PHONE [phone].

## 2022-08-03 NOTE — Progress Notes (Addendum)
Spoke w/ via phone for pre-op interview---Ruth [name] needs dos----  urine pregnancy             [name] results------08/10/22 [name] appt for cbc, bmp,type & screen, 10/29/21 chest xray, 10/29/21 & 11/22/21 EKG in chart & Epic COVID test -----patient states asymptomatic no test needed Arrive at -------0630 on Wednesday, 08/12/22 NPO after MN NO Solid Food.  Clear liquids from MN until---0530 Med rec completed Medications to take morning of surgery -----Atarax prn, Tramadol prn, Albuterol inhaler prn Diabetic medication -----Hold Ozempic the week before surgery. Last dose of Ozempic before surgery should be on 08/04/22. Patient instructed no nail polish to be worn day of surgery Patient instructed to bring photo id and insurance card day of surgery Patient aware to have Driver (ride ) / caregiver    for 24 hours after surgery - daughter Patient Special Instructions -----Extended / overnight stay instructions given. Body piercings need to be removed prior to surgery. No marijuana or cigarettes for 24 hours before surgery. Please bring inhaler on day of surgery. Pre-Op special Istructions -----none Patient verbalized understanding of instructions that were given at this phone interview. Patient denies shortness of breath, chest pain, fever, cough at this phone interview.

## 2022-08-07 ENCOUNTER — Ambulatory Visit
Admission: RE | Admit: 2022-08-07 | Discharge: 2022-08-07 | Disposition: A | Discharge status: Home / Self Care | Payer: Medicaid Other | Source: Ambulatory Visit | Attending: Obstetrics and Gynecology | Admitting: Obstetrics and Gynecology

## 2022-08-07 DIAGNOSIS — Z01419 Encounter for gynecological examination (general) (routine) without abnormal findings: Secondary | ICD-10-CM

## 2022-08-10 ENCOUNTER — Telehealth: Payer: Self-pay

## 2022-08-10 ENCOUNTER — Ambulatory Visit (INDEPENDENT_AMBULATORY_CARE_PROVIDER_SITE_OTHER): Payer: Medicaid Other | Admitting: Family Medicine

## 2022-08-10 ENCOUNTER — Encounter: Payer: Self-pay | Admitting: Family Medicine

## 2022-08-10 ENCOUNTER — Encounter (HOSPITAL_COMMUNITY)
Admission: RE | Admit: 2022-08-10 | Discharge: 2022-08-10 | Disposition: A | Discharge status: Home / Self Care | Payer: Medicaid Other | Source: Ambulatory Visit | Attending: Obstetrics and Gynecology | Admitting: Obstetrics and Gynecology

## 2022-08-10 VITALS — BP 133/89 | HR 87 | Temp 98.1°F | Resp 16 | Wt 255.4 lb

## 2022-08-10 DIAGNOSIS — E1169 Type 2 diabetes mellitus with other specified complication: Secondary | ICD-10-CM | POA: Diagnosis not present

## 2022-08-10 DIAGNOSIS — Z01818 Encounter for other preprocedural examination: Secondary | ICD-10-CM

## 2022-08-10 DIAGNOSIS — F17210 Nicotine dependence, cigarettes, uncomplicated: Secondary | ICD-10-CM

## 2022-08-10 DIAGNOSIS — Z01812 Encounter for preprocedural laboratory examination: Secondary | ICD-10-CM | POA: Insufficient documentation

## 2022-08-10 DIAGNOSIS — F3161 Bipolar disorder, current episode mixed, mild: Secondary | ICD-10-CM

## 2022-08-10 DIAGNOSIS — N939 Abnormal uterine and vaginal bleeding, unspecified: Secondary | ICD-10-CM | POA: Insufficient documentation

## 2022-08-10 DIAGNOSIS — F17200 Nicotine dependence, unspecified, uncomplicated: Secondary | ICD-10-CM

## 2022-08-10 LAB — CBC
HCT: 31 % — ABNORMAL LOW (ref 36.0–46.0)
Hemoglobin: 9.3 g/dL — ABNORMAL LOW (ref 12.0–15.0)
MCH: 23 pg — ABNORMAL LOW (ref 26.0–34.0)
MCHC: 30 g/dL (ref 30.0–36.0)
MCV: 76.5 fL — ABNORMAL LOW (ref 80.0–100.0)
Platelets: 411 10*3/uL — ABNORMAL HIGH (ref 150–400)
RBC: 4.05 MIL/uL (ref 3.87–5.11)
RDW: 18.1 % — ABNORMAL HIGH (ref 11.5–15.5)
WBC: 3.1 10*3/uL — ABNORMAL LOW (ref 4.0–10.5)
nRBC: 0 % (ref 0.0–0.2)

## 2022-08-10 LAB — BASIC METABOLIC PANEL
Anion gap: 9 (ref 5–15)
BUN: 11 mg/dL (ref 6–20)
CO2: 26 mmol/L (ref 22–32)
Calcium: 8.6 mg/dL — ABNORMAL LOW (ref 8.9–10.3)
Chloride: 102 mmol/L (ref 98–111)
Creatinine, Ser: 0.69 mg/dL (ref 0.44–1.00)
GFR, Estimated: >60 mL/min (ref >60)
Glucose, Bld: 158 mg/dL — ABNORMAL HIGH (ref 70–99)
Potassium: 3.6 mmol/L (ref 3.5–5.1)
Sodium: 137 mmol/L (ref 135–145)

## 2022-08-10 MED ORDER — OLANZAPINE 5 MG PO TABS: 5.0000 mg | ORAL_TABLET | Freq: Every day | ORAL | 0 refills | Status: DC

## 2022-08-10 NOTE — Progress Notes (Signed)
Patient called in about Dexcom sensor. She stated that hers had fallen off and wanted to know if she could put it back on before surgery on 08/12/22. I instructed her that it was fine to put back on before surgery as long as it was not on her abdomen. She said she would put it on her arm. I also reviewed what medications she could take the morning of surgery.

## 2022-08-10 NOTE — Addendum Note (Signed)
Addended by: Melene Plan on: 08/10/2022 01:18 PM   Modules accepted: Orders

## 2022-08-10 NOTE — Telephone Encounter (Signed)
Called Pt to ask her to come in & Sign Hysterectomy Statement for her scheduled surgery on the 7th of Feb. Pt advised on the way to hospital to do blood work, will come to office afterwards, advised will make sure form is at [employer] to sign. Pt verbalized understanding.

## 2022-08-10 NOTE — Progress Notes (Signed)
Established Patient Office Visit  Subjective    Patient ID: Ruth [name], female    DOB: 12/12/1975  Age: 47 y.o. MRN: [id number]  CC:  Chief Complaint  Patient presents with   Follow-up    HPI Ruth [name] presents for follow up of bipolar disorder. Patient reports improvement with present management.    Outpatient Encounter Medications as of 08/10/2022  Medication Sig   albuterol (VENTOLIN HFA) 108 (90 Base) MCG/ACT inhaler Inhale into the lungs every 6 (six) hours as needed for wheezing or shortness of breath.   celecoxib (CELEBREX) 200 MG capsule Take 200 mg by mouth daily.   Continuous Blood Gluc Receiver (DEXCOM G7 RECEIVER) DEVI Use to check blood sugar three times a day .   Continuous Blood Gluc Sensor (DEXCOM G7 SENSOR) MISC Use to check blood sugar three time daily. Change sensors once every 10 days.   hydrOXYzine (ATARAX) 25 MG tablet Take 1 tablet (25 mg total) by mouth 3 (three) times daily as needed.   minocycline (MINOCIN) 100 MG capsule Take 100 mg by mouth 2 (two) times daily.   Semaglutide,0.25 or 0.'5MG'$ /DOS, (OZEMPIC, 0.25 OR 0.5 MG/DOSE,) 2 MG/3ML SOPN Inject 0.5 mg into the skin once a week.   traMADol (ULTRAM) 50 MG tablet Take 1 tablet (50 mg total) by mouth every 6 (six) hours as needed (pain).   [DISCONTINUED] OLANZapine (ZYPREXA) 5 MG tablet Take 1 tablet (5 mg total) by mouth at bedtime.   OLANZapine (ZYPREXA) 5 MG tablet Take 1 tablet (5 mg total) by mouth at bedtime.   No facility-administered encounter medications on file as of 08/10/2022.    Past Medical History:  Diagnosis Date   Abnormal uterine bleeding 2023   Anemia 2023   Anxiety    Arthritis    chronic back pain    Asthma    rarely uses inhaler   Bipolar 1 disorder (HCC)    Follows w/ PCP Dr. Dorna Mai.   Fibromyalgia    GERD (gastroesophageal reflux disease)    Hidradenitis    multiple boils, axilla and groin (As of 08/03/22, patient has a boil in her left  axilla.)   HLD (hyperlipidemia)    diet controlled   Hx of migraines    occ   Hypertension    no meds   Hypothyroidism    Intentional drug overdose ([place]) 03/28/2021   Obesity    Sleep apnea    Patient states that she had a sleep study in the past and was told she did not need a CPAP.   Thrombocytosis 01/26/2020   Type 2 diabetes mellitus ([place])    Type 2, Follows w/ PCP Dr. Dorna Mai.   Wears glasses     Past Surgical History:  Procedure Laterality Date   ANTERIOR CERVICAL DECOMP/DISCECTOMY FUSION N/A 01/16/2021   Procedure: Cervical five-six Anterior cervical decompression/discectomy/fusion;  Surgeon: Consuella Lose, MD;  Location: [facility];  Service: Neurosurgery;  Laterality: N/A;   CARPAL TUNNEL RELEASE Right    around 2009   [place]     x2, 2003 & 2008   DILITATION & CURRETTAGE/HYSTROSCOPY WITH NOVASURE ABLATION N/A 12/07/2017   Procedure: DILATATION & CURETTAGE/HYSTEROSCOPY WITH ATTEMPED NOVASURE ABLATION, FAILED/ENDOMETERIAL ABLATION WITH HTA SYSTEM;  Surgeon: Lavonia Drafts, MD;  Location: [facility];  Service: Gynecology;  Laterality: N/A;   ELBOW SURGERY     "surgery for nerve pain" around 2009   ENDOMETRIAL BIOPSY  07/23/2022   negative for hyperplasia or malignancy  GANGLION CYST EXCISION Right    right arm    RECTOVAGINAL FISTULA CLOSURE     in [id number],9604, 2006   TONSILLECTOMY     childhood   TUBAL LIGATION  2008    Family History  Problem Relation Age of Onset   Thyroid disease Mother    Diabetes Daughter    Diabetes Son    Breast cancer Neg Hx     Social History   Socioeconomic History   Marital status: Married    Spouse name: Not on file   Number of children: Not on file   Years of education: Not on file   Highest education level: Not on file  Occupational History   Not on file  Tobacco Use   Smoking status: Every Day    Packs/day: 0.25    Types: Cigarettes   Smokeless tobacco: Never   Tobacco comments:    Per patient,  she only smokes 2 to 3 cigarettes daily as of 08/03/22.  Vaping Use   Vaping Use: Never used  Substance and Sexual Activity   Alcohol use: Not Currently   Drug use: Yes    Types: Marijuana    Comment: Occasional use only   Sexual activity: Not Currently    Birth control/protection: Surgical    Comment: tubal ligation  Other Topics Concern   Not on file  Social History Narrative   Not on file   Social Determinants of Health   Financial Resource Strain: Not on file  Food Insecurity: Not on file  Transportation Needs: Not on file  Physical Activity: Not on file  Stress: Not on file  Social Connections: Not on file  Intimate Partner Violence: Not on file    Review of Systems  Psychiatric/Behavioral:  Negative for suicidal ideas. The patient is nervous/anxious.   All other systems reviewed and are negative.       Objective    BP 133/89   Pulse 87   Temp 98.1 F (36.7 C) (Oral)   Resp 16   Wt 255 lb 6.4 oz (115.8 kg)   LMP 07/05/2022   SpO2 96%   BMI 41.22 kg/m   Physical Exam Vitals and nursing note reviewed.  Constitutional:      General: She is not in acute distress. Cardiovascular:     Rate and Rhythm: Normal rate and regular rhythm.  Pulmonary:     Effort: Pulmonary effort is normal.     Breath sounds: Normal breath sounds.  Abdominal:     Palpations: Abdomen is soft.     Tenderness: There is no abdominal tenderness.  Neurological:     General: No focal deficit present.     Mental Status: She is alert and oriented to person, place, and time.  Psychiatric:        Mood and Affect: Affect normal. Mood is anxious.         Assessment & Plan:   1. Bipolar disorder, current episode mixed, mild ([place]) Improved with present management. Continue. Meds refilled  2. Smoking Discussed cessation/reduction    Return in about 3 months (around 11/08/2022) for follow up.   Becky Sax, MD

## 2022-08-10 NOTE — Telephone Encounter (Signed)
-----   Message from Francia Greaves sent at 08/10/2022  8:26 AM EST ----- Regarding: [place] Her surgery is tomorrow, I need a hysterectomy statement signed and scanned in her chart today or her surgery will be cancelled.

## 2022-08-10 NOTE — Progress Notes (Signed)
Routed abnormal lab work including HGB of 9.3 from 08/10/22 to Dr. Currie Paris.

## 2022-08-12 ENCOUNTER — Encounter (HOSPITAL_BASED_OUTPATIENT_CLINIC_OR_DEPARTMENT_OTHER): Payer: Self-pay | Admitting: Obstetrics and Gynecology

## 2022-08-12 ENCOUNTER — Other Ambulatory Visit: Payer: Self-pay

## 2022-08-12 ENCOUNTER — Ambulatory Visit (HOSPITAL_BASED_OUTPATIENT_CLINIC_OR_DEPARTMENT_OTHER): Payer: Medicaid Other | Admitting: Anesthesiology

## 2022-08-12 ENCOUNTER — Ambulatory Visit (HOSPITAL_COMMUNITY): Payer: Medicaid Other

## 2022-08-12 ENCOUNTER — Inpatient Hospital Stay (HOSPITAL_COMMUNITY)
Admit: 2022-08-12 | Discharge: 2022-08-12 | Disposition: A | Discharge status: Home / Self Care | Payer: Medicaid Other | Attending: Anesthesiology | Admitting: Anesthesiology

## 2022-08-12 ENCOUNTER — Encounter (HOSPITAL_BASED_OUTPATIENT_CLINIC_OR_DEPARTMENT_OTHER)
Admission: RE | Disposition: A | Discharge status: Home / Self Care | Payer: Self-pay | Source: Home / Self Care | Attending: Obstetrics and Gynecology

## 2022-08-12 ENCOUNTER — Ambulatory Visit (HOSPITAL_COMMUNITY)
Admission: RE | Admit: 2022-08-12 | Discharge: 2022-08-13 | Disposition: A | Discharge status: Home / Self Care | Payer: Medicaid Other | Attending: Obstetrics and Gynecology | Admitting: Obstetrics and Gynecology

## 2022-08-12 DIAGNOSIS — N945 Secondary dysmenorrhea: Secondary | ICD-10-CM | POA: Diagnosis not present

## 2022-08-12 DIAGNOSIS — F17210 Nicotine dependence, cigarettes, uncomplicated: Secondary | ICD-10-CM

## 2022-08-12 DIAGNOSIS — D649 Anemia, unspecified: Secondary | ICD-10-CM | POA: Insufficient documentation

## 2022-08-12 DIAGNOSIS — E039 Hypothyroidism, unspecified: Secondary | ICD-10-CM

## 2022-08-12 DIAGNOSIS — E119 Type 2 diabetes mellitus without complications: Secondary | ICD-10-CM | POA: Diagnosis not present

## 2022-08-12 DIAGNOSIS — I10 Essential (primary) hypertension: Secondary | ICD-10-CM | POA: Diagnosis not present

## 2022-08-12 DIAGNOSIS — D259 Leiomyoma of uterus, unspecified: Secondary | ICD-10-CM | POA: Insufficient documentation

## 2022-08-12 DIAGNOSIS — S3660XA Unspecified injury of rectum, initial encounter: Secondary | ICD-10-CM

## 2022-08-12 DIAGNOSIS — D759 Disease of blood and blood-forming organs, unspecified: Secondary | ICD-10-CM | POA: Diagnosis not present

## 2022-08-12 DIAGNOSIS — N939 Abnormal uterine and vaginal bleeding, unspecified: Secondary | ICD-10-CM | POA: Diagnosis not present

## 2022-08-12 DIAGNOSIS — Z6841 Body Mass Index (BMI) 40.0 and over, adult: Secondary | ICD-10-CM | POA: Diagnosis not present

## 2022-08-12 DIAGNOSIS — J9811 Atelectasis: Secondary | ICD-10-CM | POA: Diagnosis not present

## 2022-08-12 DIAGNOSIS — F319 Bipolar disorder, unspecified: Secondary | ICD-10-CM | POA: Diagnosis not present

## 2022-08-12 DIAGNOSIS — D252 Subserosal leiomyoma of uterus: Secondary | ICD-10-CM | POA: Diagnosis not present

## 2022-08-12 DIAGNOSIS — N946 Dysmenorrhea, unspecified: Secondary | ICD-10-CM | POA: Insufficient documentation

## 2022-08-12 DIAGNOSIS — N858 Other specified noninflammatory disorders of uterus: Secondary | ICD-10-CM | POA: Diagnosis not present

## 2022-08-12 DIAGNOSIS — Z01818 Encounter for other preprocedural examination: Secondary | ICD-10-CM

## 2022-08-12 DIAGNOSIS — K631 Perforation of intestine (nontraumatic): Secondary | ICD-10-CM | POA: Diagnosis not present

## 2022-08-12 DIAGNOSIS — D638 Anemia in other chronic diseases classified elsewhere: Secondary | ICD-10-CM

## 2022-08-12 HISTORY — PX: CYSTOSCOPY: SHX5120

## 2022-08-12 HISTORY — DX: Hidradenitis suppurativa: L73.2

## 2022-08-12 HISTORY — PX: ROBOTIC ASSISTED TOTAL HYSTERECTOMY: SHX6085

## 2022-08-12 HISTORY — DX: Presence of spectacles and contact lenses: Z97.3

## 2022-08-12 HISTORY — PX: XI ROBOT ASSISTED RECTOPEXY: SHX6788

## 2022-08-12 LAB — POCT PREGNANCY, URINE: Preg Test, Ur: NEGATIVE

## 2022-08-12 LAB — MICROALBUMIN / CREATININE URINE RATIO
Creatinine, Urine: 173.8 mg/dL
Microalb/Creat Ratio: 6 mg/g creat (ref 0–29)
Microalbumin, Urine: 11 ug/mL

## 2022-08-12 LAB — GLUCOSE, CAPILLARY
Glucose-Capillary: 135 mg/dL — ABNORMAL HIGH (ref 70–99)
Glucose-Capillary: 154 mg/dL — ABNORMAL HIGH (ref 70–99)
Glucose-Capillary: 178 mg/dL — ABNORMAL HIGH (ref 70–99)

## 2022-08-12 LAB — PREPARE RBC (CROSSMATCH)

## 2022-08-12 SURGERY — HYSTERECTOMY, TOTAL, ROBOT-ASSISTED
Anesthesia: General | Site: Rectum

## 2022-08-12 MED ORDER — PROPOFOL 10 MG/ML IV BOLUS
INTRAVENOUS | Status: AC
  Filled 2022-08-12: qty 20

## 2022-08-12 MED ORDER — OLANZAPINE 5 MG PO TABS
5.0000 mg | ORAL_TABLET | Freq: Every day | ORAL | Status: DC
  Administered 2022-08-12: 5 mg via ORAL
  Filled 2022-08-12: qty 1

## 2022-08-12 MED ORDER — SODIUM CHLORIDE 0.9 % IR SOLN
Status: DC | PRN
  Administered 2022-08-12: 200 mL

## 2022-08-12 MED ORDER — DOCUSATE SODIUM 100 MG PO CAPS
100.0000 mg | ORAL_CAPSULE | Freq: Two times a day (BID) | ORAL | Status: DC
  Administered 2022-08-12 – 2022-08-13 (×2): 100 mg via ORAL

## 2022-08-12 MED ORDER — DEXAMETHASONE SODIUM PHOSPHATE 10 MG/ML IJ SOLN
INTRAMUSCULAR | Status: DC | PRN
  Administered 2022-08-12: 10 mg via INTRAVENOUS

## 2022-08-12 MED ORDER — EPHEDRINE 5 MG/ML INJ
INTRAVENOUS | Status: AC
  Filled 2022-08-12: qty 5

## 2022-08-12 MED ORDER — HYDROMORPHONE HCL 1 MG/ML IJ SOLN
INTRAMUSCULAR | Status: AC
  Filled 2022-08-12: qty 1

## 2022-08-12 MED ORDER — FENTANYL CITRATE (PF) 100 MCG/2ML IJ SOLN
INTRAMUSCULAR | Status: DC | PRN
  Administered 2022-08-12 (×4): 50 ug via INTRAVENOUS

## 2022-08-12 MED ORDER — SENNA 8.6 MG PO TABS
1.0000 | ORAL_TABLET | Freq: Two times a day (BID) | ORAL | Status: DC
  Administered 2022-08-12: 8.6 mg via ORAL

## 2022-08-12 MED ORDER — ROCURONIUM BROMIDE 10 MG/ML (PF) SYRINGE
PREFILLED_SYRINGE | INTRAVENOUS | Status: DC | PRN
  Administered 2022-08-12: 20 mg via INTRAVENOUS
  Administered 2022-08-12: 80 mg via INTRAVENOUS
  Administered 2022-08-12 (×2): 10 mg via INTRAVENOUS

## 2022-08-12 MED ORDER — HEMOSTATIC AGENTS (NO CHARGE) OPTIME
TOPICAL | Status: DC | PRN
  Administered 2022-08-12: 1

## 2022-08-12 MED ORDER — ROCURONIUM BROMIDE 10 MG/ML (PF) SYRINGE
PREFILLED_SYRINGE | INTRAVENOUS | Status: AC
  Filled 2022-08-12: qty 10

## 2022-08-12 MED ORDER — HYDROMORPHONE HCL 1 MG/ML IJ SOLN
0.2500 mg | INTRAMUSCULAR | Status: DC | PRN
  Administered 2022-08-12 (×3): 0.25 mg via INTRAVENOUS

## 2022-08-12 MED ORDER — LACTATED RINGERS IV SOLN: INTRAVENOUS | Status: DC

## 2022-08-12 MED ORDER — STERILE WATER FOR IRRIGATION IR SOLN
Status: DC | PRN
  Administered 2022-08-12: 500 mL

## 2022-08-12 MED ORDER — KETOROLAC TROMETHAMINE 30 MG/ML IJ SOLN
INTRAMUSCULAR | Status: AC
  Filled 2022-08-12: qty 1

## 2022-08-12 MED ORDER — DOCUSATE SODIUM 100 MG PO CAPS
ORAL_CAPSULE | ORAL | Status: AC
  Filled 2022-08-12: qty 1

## 2022-08-12 MED ORDER — GENTAMICIN SULFATE 40 MG/ML IJ SOLN
400.0000 mg | INTRAVENOUS | Status: AC
  Administered 2022-08-12: 400 mg via INTRAVENOUS
  Filled 2022-08-12: qty 10

## 2022-08-12 MED ORDER — OXYCODONE HCL 5 MG/5ML PO SOLN: 5.0000 mg | Freq: Once | ORAL | Status: DC | PRN

## 2022-08-12 MED ORDER — DEXAMETHASONE SODIUM PHOSPHATE 10 MG/ML IJ SOLN
INTRAMUSCULAR | Status: AC
  Filled 2022-08-12: qty 1

## 2022-08-12 MED ORDER — KETOROLAC TROMETHAMINE 30 MG/ML IJ SOLN
INTRAMUSCULAR | Status: DC | PRN
  Administered 2022-08-12: 30 mg via INTRAVENOUS

## 2022-08-12 MED ORDER — INSULIN ASPART 100 UNIT/ML IJ SOLN
0.0000 [IU] | Freq: Three times a day (TID) | INTRAMUSCULAR | Status: DC
  Administered 2022-08-12 – 2022-08-13 (×2): 3 [IU] via SUBCUTANEOUS

## 2022-08-12 MED ORDER — ONDANSETRON HCL 4 MG/2ML IJ SOLN
INTRAMUSCULAR | Status: DC | PRN
  Administered 2022-08-12: 4 mg via INTRAVENOUS

## 2022-08-12 MED ORDER — ONDANSETRON HCL 4 MG/2ML IJ SOLN
INTRAMUSCULAR | Status: AC
  Filled 2022-08-12: qty 2

## 2022-08-12 MED ORDER — SENNA 8.6 MG PO TABS
ORAL_TABLET | ORAL | Status: AC
  Filled 2022-08-12: qty 1

## 2022-08-12 MED ORDER — ONDANSETRON HCL 4 MG/2ML IJ SOLN: 4.0000 mg | Freq: Four times a day (QID) | INTRAMUSCULAR | Status: DC | PRN

## 2022-08-12 MED ORDER — WHITE PETROLATUM EX OINT
TOPICAL_OINTMENT | CUTANEOUS | Status: AC
  Filled 2022-08-12: qty 10

## 2022-08-12 MED ORDER — OXYCODONE HCL 5 MG PO TABS: 5.0000 mg | ORAL_TABLET | Freq: Once | ORAL | Status: DC | PRN

## 2022-08-12 MED ORDER — SODIUM CHLORIDE (PF) 0.9 % IJ SOLN
INTRAMUSCULAR | Status: AC
  Filled 2022-08-12: qty 40

## 2022-08-12 MED ORDER — OXYCODONE HCL 5 MG PO TABS
5.0000 mg | ORAL_TABLET | ORAL | Status: DC | PRN
  Administered 2022-08-12 – 2022-08-13 (×4): 10 mg via ORAL

## 2022-08-12 MED ORDER — IBUPROFEN 200 MG PO TABS: 600.0000 mg | ORAL_TABLET | Freq: Four times a day (QID) | ORAL | Status: DC

## 2022-08-12 MED ORDER — CLINDAMYCIN PHOSPHATE 900 MG/50ML IV SOLN
INTRAVENOUS | Status: AC
  Filled 2022-08-12: qty 50

## 2022-08-12 MED ORDER — SUGAMMADEX SODIUM 500 MG/5ML IV SOLN
INTRAVENOUS | Status: AC
  Filled 2022-08-12: qty 5

## 2022-08-12 MED ORDER — GABAPENTIN 300 MG PO CAPS
300.0000 mg | ORAL_CAPSULE | ORAL | Status: AC
  Administered 2022-08-12: 300 mg via ORAL

## 2022-08-12 MED ORDER — LIDOCAINE 2% (20 MG/ML) 5 ML SYRINGE
INTRAMUSCULAR | Status: DC | PRN
  Administered 2022-08-12: 100 mg via INTRAVENOUS

## 2022-08-12 MED ORDER — POLYETHYLENE GLYCOL 3350 17 G PO PACK: 17.0000 g | PACK | Freq: Every day | ORAL | Status: DC | PRN

## 2022-08-12 MED ORDER — LIDOCAINE HCL 1 % IJ SOLN
INTRAMUSCULAR | Status: AC
  Filled 2022-08-12: qty 20

## 2022-08-12 MED ORDER — BUPIVACAINE HCL (PF) 0.5 % IJ SOLN
INTRAMUSCULAR | Status: DC | PRN
  Administered 2022-08-12: 20 mL

## 2022-08-12 MED ORDER — HYDROMORPHONE HCL 1 MG/ML IJ SOLN: 0.2000 mg | INTRAMUSCULAR | Status: DC | PRN

## 2022-08-12 MED ORDER — FENTANYL CITRATE (PF) 250 MCG/5ML IJ SOLN
INTRAMUSCULAR | Status: AC
  Filled 2022-08-12: qty 5

## 2022-08-12 MED ORDER — ACETAMINOPHEN 500 MG PO TABS
ORAL_TABLET | ORAL | Status: AC
  Filled 2022-08-12: qty 2

## 2022-08-12 MED ORDER — CLINDAMYCIN PHOSPHATE 900 MG/50ML IV SOLN
900.0000 mg | INTRAVENOUS | Status: AC
  Administered 2022-08-12: 900 mg via INTRAVENOUS

## 2022-08-12 MED ORDER — ENOXAPARIN SODIUM 40 MG/0.4ML IJ SOSY
40.0000 mg | PREFILLED_SYRINGE | INTRAMUSCULAR | Status: DC
  Administered 2022-08-13: 40 mg via SUBCUTANEOUS

## 2022-08-12 MED ORDER — LIDOCAINE HCL (PF) 2 % IJ SOLN
INTRAMUSCULAR | Status: AC
  Filled 2022-08-12: qty 5

## 2022-08-12 MED ORDER — FLUORESCEIN SODIUM 10 % IV SOLN
INTRAVENOUS | Status: DC | PRN
  Administered 2022-08-12: 50 mg via INTRAVENOUS

## 2022-08-12 MED ORDER — POVIDONE-IODINE 10 % EX SWAB: 2.0000 | Freq: Once | CUTANEOUS | Status: DC

## 2022-08-12 MED ORDER — SUGAMMADEX SODIUM 200 MG/2ML IV SOLN
INTRAVENOUS | Status: DC | PRN
  Administered 2022-08-12: 250 mg via INTRAVENOUS

## 2022-08-12 MED ORDER — OXYCODONE HCL 5 MG PO TABS
ORAL_TABLET | ORAL | Status: AC
  Filled 2022-08-12: qty 2

## 2022-08-12 MED ORDER — PROPOFOL 10 MG/ML IV BOLUS
INTRAVENOUS | Status: DC | PRN
  Administered 2022-08-12: 200 mg via INTRAVENOUS

## 2022-08-12 MED ORDER — ONDANSETRON HCL 4 MG/2ML IJ SOLN: 4.0000 mg | Freq: Once | INTRAMUSCULAR | Status: DC | PRN

## 2022-08-12 MED ORDER — INSULIN ASPART 100 UNIT/ML IJ SOLN
INTRAMUSCULAR | Status: AC
  Filled 2022-08-12: qty 1

## 2022-08-12 MED ORDER — MIDAZOLAM HCL 2 MG/2ML IJ SOLN
INTRAMUSCULAR | Status: AC
  Filled 2022-08-12: qty 2

## 2022-08-12 MED ORDER — SODIUM CHLORIDE 0.9% IV SOLUTION: Freq: Once | INTRAVENOUS | Status: DC

## 2022-08-12 MED ORDER — GABAPENTIN 300 MG PO CAPS
ORAL_CAPSULE | ORAL | Status: AC
  Filled 2022-08-12: qty 1

## 2022-08-12 MED ORDER — KETOROLAC TROMETHAMINE 30 MG/ML IJ SOLN: 30.0000 mg | Freq: Once | INTRAMUSCULAR | Status: AC | PRN

## 2022-08-12 MED ORDER — HYDROXYZINE HCL 25 MG PO TABS
25.0000 mg | ORAL_TABLET | Freq: Three times a day (TID) | ORAL | Status: DC | PRN
  Administered 2022-08-13: 25 mg via ORAL
  Filled 2022-08-12: qty 1

## 2022-08-12 MED ORDER — ONDANSETRON HCL 4 MG PO TABS: 4.0000 mg | ORAL_TABLET | Freq: Four times a day (QID) | ORAL | Status: DC | PRN

## 2022-08-12 MED ORDER — ACETAMINOPHEN 500 MG PO TABS
1000.0000 mg | ORAL_TABLET | ORAL | Status: AC
  Administered 2022-08-12: 1000 mg via ORAL

## 2022-08-12 MED ORDER — SODIUM CHLORIDE 0.9 % IR SOLN
Status: DC | PRN
  Administered 2022-08-12: 700 mL via INTRAVESICAL

## 2022-08-12 MED ORDER — MIDAZOLAM HCL 2 MG/2ML IJ SOLN
INTRAMUSCULAR | Status: DC | PRN
  Administered 2022-08-12: 2 mg via INTRAVENOUS

## 2022-08-12 MED ORDER — FLUORESCEIN SODIUM 10 % IV SOLN
INTRAVENOUS | Status: AC
  Filled 2022-08-12: qty 5

## 2022-08-12 MED ORDER — KETOROLAC TROMETHAMINE 30 MG/ML IJ SOLN
30.0000 mg | Freq: Four times a day (QID) | INTRAMUSCULAR | Status: DC
  Administered 2022-08-12 – 2022-08-13 (×3): 30 mg via INTRAVENOUS

## 2022-08-12 MED ORDER — ACETAMINOPHEN 500 MG PO TABS
1000.0000 mg | ORAL_TABLET | Freq: Four times a day (QID) | ORAL | Status: DC
  Administered 2022-08-12 – 2022-08-13 (×3): 1000 mg via ORAL

## 2022-08-12 SURGICAL SUPPLY — 85 items
ADH SKN CLS APL DERMABOND .7 (GAUZE/BANDAGES/DRESSINGS) ×3
APL SRG 38 LTWT LNG FL B (MISCELLANEOUS) ×3
APL SWBSTK 6 STRL LF DISP (MISCELLANEOUS) ×3
APPLICATOR ARISTA FLEXITIP XL (MISCELLANEOUS) IMPLANT
APPLICATOR COTTON TIP 6 STRL (MISCELLANEOUS) IMPLANT
APPLICATOR COTTON TIP 6IN STRL (MISCELLANEOUS) ×3
BARRIER ADHS 3X4 INTERCEED (GAUZE/BANDAGES/DRESSINGS) IMPLANT
BRR ADH 4X3 ABS CNTRL BYND (GAUZE/BANDAGES/DRESSINGS)
CATH FOLEY 3WAY  5CC 16FR (CATHETERS) ×3
CATH FOLEY 3WAY 5CC 16FR (CATHETERS) ×3 IMPLANT
COVER BACK TABLE 60X90IN (DRAPES) ×3 IMPLANT
COVER TIP SHEARS 8 DVNC (MISCELLANEOUS) ×3 IMPLANT
COVER TIP SHEARS 8MM DA VINCI (MISCELLANEOUS) ×3
DEFOGGER SCOPE WARMER CLEARIFY (MISCELLANEOUS) ×3 IMPLANT
DERMABOND ADVANCED .7 DNX12 (GAUZE/BANDAGES/DRESSINGS) ×3 IMPLANT
DRAPE ARM DVNC X/XI (DISPOSABLE) ×9 IMPLANT
DRAPE COLUMN DVNC XI (DISPOSABLE) ×3 IMPLANT
DRAPE DA VINCI XI ARM (DISPOSABLE) ×12
DRAPE DA VINCI XI COLUMN (DISPOSABLE) ×3
DRAPE SURG IRRIG POUCH 19X23 (DRAPES) ×3 IMPLANT
DRAPE UTILITY XL STRL (DRAPES) ×3 IMPLANT
DURAPREP 26ML APPLICATOR (WOUND CARE) ×3 IMPLANT
ELECT REM PT RETURN 9FT ADLT (ELECTROSURGICAL) ×3
ELECTRODE REM PT RTRN 9FT ADLT (ELECTROSURGICAL) ×3 IMPLANT
GAUZE 4X4 16PLY ~~LOC~~+RFID DBL (SPONGE) IMPLANT
GLOVE BIO SURGEON STRL SZ7 (GLOVE) ×9 IMPLANT
GLOVE BIOGEL PI IND STRL 6.5 (GLOVE) IMPLANT
GLOVE BIOGEL PI IND STRL 7.0 (GLOVE) ×9 IMPLANT
GLOVE SURG SS PI 6.5 STRL IVOR (GLOVE) IMPLANT
GLOVE SURG SS PI 7.5 STRL IVOR (GLOVE) IMPLANT
GOWN STRL REUS W/ TWL LRG LVL3 (GOWN DISPOSABLE) IMPLANT
GOWN STRL REUS W/TWL LRG LVL3 (GOWN DISPOSABLE) ×3 IMPLANT
GYRUS RUMI II 3.5CM BLUE (DISPOSABLE) ×3
HEMOSTAT ARISTA ABSORB 3G PWDR (HEMOSTASIS) IMPLANT
HOLDER FOLEY CATH W/STRAP (MISCELLANEOUS) IMPLANT
IRRIG SUCT STRYKERFLOW 2 WTIP (MISCELLANEOUS) ×3
IRRIGATION SUCT STRKRFLW 2 WTP (MISCELLANEOUS) ×3 IMPLANT
IV NS 1000ML (IV SOLUTION) ×9
IV NS 1000ML BAXH (IV SOLUTION) IMPLANT
KIT PINK PAD W/HEAD ARE REST (MISCELLANEOUS) ×3
KIT PINK PAD W/HEAD ARM REST (MISCELLANEOUS) ×3 IMPLANT
KIT TURNOVER CYSTO (KITS) ×3 IMPLANT
LEGGING LITHOTOMY PAIR STRL (DRAPES) ×3 IMPLANT
MANIPULATOR ADVINCU DEL 2.5 PL (MISCELLANEOUS) IMPLANT
MANIPULATOR ADVINCU DEL 3.0 PL (MISCELLANEOUS) IMPLANT
MANIPULATOR ADVINCU DEL 3.5 PL (MISCELLANEOUS) IMPLANT
MANIPULATOR ADVINCU DEL 4.0 PL (MISCELLANEOUS) IMPLANT
NDL INSUFFLATION 14GA 120MM (NEEDLE) ×3 IMPLANT
NEEDLE INSUFFLATION 14GA 120MM (NEEDLE) ×3 IMPLANT
OBTURATOR OPTICAL STANDARD 8MM (TROCAR) ×3
OBTURATOR OPTICAL STND 8 DVNC (TROCAR) ×3
OBTURATOR OPTICALSTD 8 DVNC (TROCAR) ×3 IMPLANT
OCCLUDER COLPOPNEUMO (BALLOONS) ×3 IMPLANT
PACK ROBOT WH (CUSTOM PROCEDURE TRAY) ×3 IMPLANT
PACK ROBOTIC GOWN (GOWN DISPOSABLE) ×3 IMPLANT
PAD OB MATERNITY 4.3X12.25 (PERSONAL CARE ITEMS) ×3 IMPLANT
PAD PREP 24X48 CUFFED NSTRL (MISCELLANEOUS) ×3 IMPLANT
PROTECTOR NERVE ULNAR (MISCELLANEOUS) ×6 IMPLANT
RUMI II GYRUS 3.5CM BLUE (DISPOSABLE) IMPLANT
SCISSORS LAP 5X35 DISP (ENDOMECHANICALS) IMPLANT
SEAL CANN UNIV 5-8 DVNC XI (MISCELLANEOUS) ×9 IMPLANT
SEAL XI 5MM-8MM UNIVERSAL (MISCELLANEOUS) ×12
SEALER VESSEL DA VINCI XI (MISCELLANEOUS) ×3
SEALER VESSEL EXT DVNC XI (MISCELLANEOUS) IMPLANT
SET IRRIG Y TYPE TUR BLADDER L (SET/KITS/TRAYS/PACK) IMPLANT
SET TRI-LUMEN FLTR TB AIRSEAL (TUBING) ×3 IMPLANT
SPIKE FLUID TRANSFER (MISCELLANEOUS) ×3 IMPLANT
SUT VIC AB 0 CT1 27 (SUTURE) ×6
SUT VIC AB 0 CT1 27XBRD ANBCTR (SUTURE) ×6 IMPLANT
SUT VIC AB 2-0 SH 27 (SUTURE) ×3
SUT VIC AB 2-0 SH 27XBRD (SUTURE) IMPLANT
SUT VIC AB 4-0 PS2 18 (SUTURE) ×6 IMPLANT
SUT VICRYL 0 UR6 27IN ABS (SUTURE) IMPLANT
SUT VLOC 180 0 9IN  GS21 (SUTURE) ×3
SUT VLOC 180 0 9IN GS21 (SUTURE) ×3 IMPLANT
TIP RUMI ORANGE 6.7MMX12CM (TIP) ×3 IMPLANT
TIP UTERINE 5.1X6CM LAV DISP (MISCELLANEOUS) ×3 IMPLANT
TIP UTERINE 6.7X10CM GRN DISP (MISCELLANEOUS) ×3 IMPLANT
TIP UTERINE 6.7X6CM WHT DISP (MISCELLANEOUS) ×3 IMPLANT
TIP UTERINE 6.7X8CM BLUE DISP (MISCELLANEOUS) ×3 IMPLANT
TOWEL OR 17X26 10 PK STRL BLUE (TOWEL DISPOSABLE) ×3 IMPLANT
TROCAR PORT AIRSEAL 5X120 (TROCAR) ×3 IMPLANT
TROCAR Z-THREAD FIOS 5X100MM (TROCAR) IMPLANT
WATER STERILE IRR 1000ML POUR (IV SOLUTION) ×3 IMPLANT
WATER STERILE IRR 500ML POUR (IV SOLUTION) IMPLANT

## 2022-08-12 NOTE — Anesthesia Preprocedure Evaluation (Addendum)
Anesthesia Evaluation  Patient identified by MRN, date of birth, ID band Patient awake    Reviewed: Allergy & Precautions, H&P , NPO status , Patient's Chart, lab work & pertinent test results  Airway Mallampati: II  TM Distance: >3 FB Neck ROM: Full    Dental no notable dental hx.    Pulmonary asthma , Current Smoker and Patient abstained from smoking.   Pulmonary exam normal breath sounds clear to auscultation       Cardiovascular hypertension, Normal cardiovascular exam Rhythm:Regular Rate:Normal     Neuro/Psych     Bipolar Disorder   negative neurological ROS     GI/Hepatic negative GI ROS, Neg liver ROS,,,  Endo/Other  diabetes, Type 2Hypothyroidism  Morbid obesity  Renal/GU negative Renal ROS  negative genitourinary   Musculoskeletal negative musculoskeletal ROS (+)    Abdominal   Peds negative pediatric ROS (+)  Hematology  (+) Blood dyscrasia, anemia   Anesthesia Other Findings   Reproductive/Obstetrics negative OB ROS                             Anesthesia Physical Anesthesia Plan  ASA: 3  Anesthesia Plan: General   Post-op Pain Management: Gabapentin PO (pre-op)*   Induction: Intravenous  PONV Risk Score and Plan: 2 and Ondansetron, Dexamethasone and Treatment may vary due to age or medical condition  Airway Management Planned: Oral ETT  Additional Equipment:   Intra-op Plan:   Post-operative Plan: Extubation in OR  Informed Consent: I have reviewed the patients History and Physical, chart, labs and discussed the procedure including the risks, benefits and alternatives for the proposed anesthesia with the patient or authorized representative who has indicated his/her understanding and acceptance.     Dental advisory given  Plan Discussed with: CRNA and Surgeon  Anesthesia Plan Comments:        Anesthesia Quick Evaluation

## 2022-08-12 NOTE — Anesthesia Procedure Notes (Signed)
Procedure Name: Intubation Date/Time: 08/12/2022 9:35 AM  Performed by: Suan Halter, CRNAPre-anesthesia Checklist: Patient identified, Emergency Drugs available, Suction available and Patient being monitored Patient Re-evaluated:Patient Re-evaluated prior to induction Oxygen Delivery Method: Circle system utilized Preoxygenation: Pre-oxygenation with 100% oxygen Induction Type: IV induction Ventilation: Mask ventilation without difficulty Laryngoscope Size: Mac and 4 Grade View: Grade I Tube type: Oral Tube size: 7.0 mm Number of attempts: 1 Airway Equipment and Method: Stylet and Oral airway Placement Confirmation: ETT inserted through vocal cords under direct vision, positive ETCO2 and breath sounds checked- equal and bilateral Secured at: 22 cm Tube secured with: Tape Dental Injury: Teeth and Oropharynx as per pre-operative assessment

## 2022-08-12 NOTE — Op Note (Signed)
[facility] PROCEDURE DATE: 08/12/2022  PREOPERATIVE DIAGNOSIS: abnormal uterine bleeding, dysmenorrhea POSTOPERATIVE DIAGNOSIS: abnormal uterine bleeding, dysmenorrhea PROCEDURE:    robotic assisted total laparoscopic hysterectomy, bilateral salpingectomy, cystoscopy, lysis of adhesions and serosal repair by general surgery SURGEON: Darliss Cheney, MD ASSISTANT:  Dr. Elgie Congo and Dr. Ilda Basset.  An experienced assistant was required given the standard of surgical care given the complexity of the case.  This assistant was needed for exposure, dissection, suctioning, retraction, instrument exchange, and for overall help during the procedure.  INDICATIONS: 47 y.o. [id number] with AUB and dysmenorrhea.  Risks of surgery were discussed with the patient including but not limited to: bleeding which may require transfusion; infection which may require antibiotics; injury to uterus or surrounding organs; need for additional procedures including laparotomy;and other postoperative/anesthesia complications. Written informed consent was obtained.    FINDINGS:  Laparoscopy  A 10 week size uterus,  specimen sent to pathology. Omental adhesion through midline w/ suture noted at abdominal wall Endometriotic appearing lesions on bilateral tubes and on right IP ligament  Normal bilateral ovaries Surgically ligated fallopian tubes bilaterally Dense anterior adhesions to the bladder Normal posteiror cul de sac Bilateral ureters visualized Cystoscopy Intact bladder wall without lesions or injury Fluorescein stained urine from bilateral ureteral orifices  ANESTHESIA: General INTRAVENOUS FLUIDS:  1400 ml of LR ESTIMATED BLOOD LOSS:  50 cc SPECIMENS: uterus, cervix, bilateral fallopian tubes COMPLICATIONS:  None immediate.     PROCEDURE: The risks, benefits, and alternatives of surgery were explained, understood, and accepted. Consents were signed. All questions were answered. She was taken to the  operating room and general anesthesia was applied without complication. She was placed in the dorsal lithotomy position and her abdomen and vagina were prepped and draped after she had been carefully positioned on the table. A bimanual exam revealed a 10 week size uterus that was mobile. Her adnexa were not enlarged. A Foley catheter was placed and it drained clear throughout the case.  A bivalve speculum was placed in the vagina, and the cervix visualized.  The anterior lip of the cervix was grasped with a single-tooth tenaculum.  Paracervical block using 0.5% bupivacaine was administered.  The cervix was measured and the uterus was sounded to 10 cm. A Rumi uterine manipulator with 3.5cm cup was placed without difficulty.  Gloves were changed and attention was turned to the abdomen.  The left upper quadrant area was identified, and the skin infiltrated with local anesthetic.  An 8 mm incision was made in the skin, a laparoscope with Optiview trocar were introduced into the abdomen.  Intra-abdominal entry was confirmed with direct visualization, and low opening pressure.  After good pneumoperitoneum was established, she was placed in Trendelenburg position and ports were placed in appropriate positions on her abdomen to allow maximum exposure during the robotic case.  There were thick adhesions of omentum to the anterior abdominal wall that were noted.  Initially thin filmy adhesions were started to be taken down using laparoscopic scissors.  When noted that the omentum appeared densely adhered to the anterior abdominal wall without extending into the pelvis, the decision was made to place the remaining trocars.  An 8 mm trocar was placed in the right lower quadrant, in the left lower quadrant, in the right upper quadrant, and to the left of the midline so as to avoid the intra-abdominal adhesions..  These were all placed under direct laparoscopic visualization. The robot was docked and I proceeded with a robotic  portion of the case.  The pelvis was inspected and the uterus was found to have fibroids and be enlarged, with thick adhesions anteriorly to the bladder, and evidence of prior tubal ligation..  The ovaries were found to be normal.  The vessel sealer was then used to remove the fallopian tube on the left from the mesosalpinx, and from the uterus.  The utero-ovarian ligament was then cauterized and divided using the vessel sealer.  The round ligament was similarly cauterized and divided, and the broad ligament opened.  Posterior leaf of the broad ligament was dissected to expose the uterine vessels.  There was dissection made anteriorly with care to avoid the dense anterior adhesion, and allowed for visualization of the uterine vessels.  Uterine vessels were then cauterized.  Attention was then turned to the right side.  Similarly the fallopian tube was removed from the underlying mesosalpinx.  The utero-ovarian ligament was cut.  The round ligament was subsequently divided and the broad ligament separated.  Ligament was dissected down to the level of the cuff posteriorly.  The anterior adhesion was carefully undermined, after bladder was backfilled to allow for visualization of the scar versus the bladder.  After careful blunt and sharp dissection, the bladder flap was created and the bladder mobilized away from the lower uterine segment.  The right uterine vessels were serially cauterized divided and lateralized to the cup edge.  Attention was returned to the left side, and the uterine vessels were further cauterized divided and lateralized to the cup edge. The fallopian tube segments were removed from the abdomen. The bladder was pushed out of the operative site and an anterior colpotomy was made. The colpotomy incision was extended circumferentially, following the blue outline of the Rumi manipulator. All pedicles were hemostatic.  The uterus was removed from the vagina.  The vaginal cuff was closed with v-lock  suture in a running fashion.  Excellent hemostasis was noted throughout. The serosal tear on the rectum was noted and decision made for intraoperative general surgery consult. The tear was primarily repaired (see separate op note). The pelvis was irrigated and arista applied.  The intraabdominal pressure was lowered assess hemostasis. After determining excellent hemostasis, the robot was undocked. At this point I performed cystoscopy. The cystoscopy revealed green ejection from both ureters.  The skin was closed with 4-0 monocryl.  The patient was then extubated and taken to recovery in stable condition.   Sponge, lap and needle counts were correct x 2.

## 2022-08-12 NOTE — Brief Op Note (Signed)
08/12/2022  1:22 PM  PATIENT:  Ruth [name]  47 y.o. female  PRE-OPERATIVE DIAGNOSIS:  AUB  POST-OPERATIVE DIAGNOSIS:  AUB  PROCEDURE:  Procedure(s): XI ROBOTIC ASSISTED TOTAL HYSTERECTOMY WITH SALPINGECTOMY (Bilateral) CYSTOSCOPY (N/A)  SURGEON:  Surgeon(s) and Role: Panel 1:    Darliss Cheney, MD - Primary    * Aletha Halim, MD - Assisting    * Griffin Basil, MD - Assisting Panel 2:    Michael Boston, MD - Primary  PHYSICIAN ASSISTANT: n/a  ASSISTANTS: noted above   ANESTHESIA:   general  EBL:  50cc   BLOOD ADMINISTERED:none  DRAINS: none   LOCAL MEDICATIONS USED:  BUPIVICAINE   SPECIMEN:  Excision - Uterus, cervix, bilateral fallopian tubes  DISPOSITION OF SPECIMEN:  PATHOLOGY  COUNTS:  YES  TOURNIQUET:  * No tourniquets in log *  DICTATION: .Note written in EPIC  PLAN OF CARE: Admit for overnight observation  PATIENT DISPOSITION:  PACU - hemodynamically stable.   Delay start of Pharmacological VTE agent (>24hrs) due to surgical blood loss or risk of bleeding: not applicable

## 2022-08-12 NOTE — Transfer of Care (Signed)
Immediate Anesthesia Transfer of Care Note  Patient: Ruth [name]  Procedure(s) Performed: Procedure(s) (LRB): XI ROBOTIC ASSISTED TOTAL HYSTERECTOMY WITH SALPINGECTOMY (Bilateral) CYSTOSCOPY (N/A)  Patient Location: PACU  Anesthesia Type: General  Level of Consciousness: awake, oriented, sedated and patient cooperative  Airway & Oxygen Therapy: Patient Spontanous Breathing and Patient connected to face mask oxygen  Post-op Assessment: Report given to PACU RN and Post -op Vital signs reviewed and stable  Post vital signs: Reviewed and stable  Complications: No apparent anesthesia complications  Last Vitals:  Vitals Value Taken Time  BP 157/91 08/12/22 1431  Temp 36.6 C 08/12/22 1343  Pulse 75 08/12/22 1437  Resp 18 08/12/22 1437  SpO2 95 % 08/12/22 1437  Vitals shown include unvalidated device data.  Last Pain:  Vitals:   08/12/22 1431  TempSrc:   PainSc: 10-Worst pain ever      Patients Stated Pain Goal: 5 (87/19/59 7471)  Complications: No notable events documented.

## 2022-08-12 NOTE — Progress Notes (Signed)
Patient able to raise hips and assist in pulling up briefs, bend knees and push self up to head of bed without complaint.

## 2022-08-12 NOTE — Interval H&P Note (Signed)
History and Physical Interval Note:  08/12/2022 7:10 AM  Ruth [name]  has presented today for surgery, with the diagnosis of AUB.  The various methods of treatment have been discussed with the patient and family. After consideration of risks, benefits and other options for treatment, the patient has consented to  Procedure(s): XI ROBOTIC ASSISTED TOTAL HYSTERECTOMY WITH SALPINGECTOMY (Bilateral) CYSTOSCOPY (N/A) as a surgical intervention.  The patient's history has been reviewed, patient examined, no change in status, stable for surgery.  I have reviewed the patient's chart and labs.  Questions were answered to the patient's satisfaction.     [name]

## 2022-08-12 NOTE — Anesthesia Procedure Notes (Signed)
Anesthesia Procedure Image    

## 2022-08-12 NOTE — Anesthesia Procedure Notes (Signed)
Central Venous Catheter Insertion Performed by: Myrtie Soman, MD, anesthesiologist Start/End2/01/2023 9:40 AM, 08/12/2022 10:00 AM Patient location: Pre-op. Preanesthetic checklist: patient identified, IV checked, site marked, risks and benefits discussed, surgical consent, monitors and equipment checked, pre-op evaluation, timeout performed and anesthesia consent Position: Trendelenburg Lidocaine 1% used for infiltration and patient sedated Hand hygiene performed  and maximum sterile barriers used  Catheter size: 8 Fr Total catheter length 16. Double lumen Procedure performed using ultrasound guided technique. Ultrasound Notes:anatomy identified, needle tip was noted to be adjacent to the nerve/plexus identified, no ultrasound evidence of intravascular and/or intraneural injection and image(s) printed for medical record Attempts: 1 Following insertion, line sutured, dressing applied and Biopatch. Post procedure assessment: blood return through all ports, free fluid flow and no air  Patient tolerated the procedure well with no immediate complications.

## 2022-08-12 NOTE — Addendum Note (Signed)
Addendum  created 08/12/22 1533 by Suan Halter, CRNA   Intraprocedure Event edited

## 2022-08-12 NOTE — Op Note (Signed)
08/12/2022 1:03 PM  PATIENT:  Ruth [name]  47 y.o. female  Patient Care Team: Dorna Mai, MD as PCP - General (Family Medicine)  PRE-OPERATIVE DIAGNOSIS:  AUB  POST-OPERATIVE DIAGNOSIS:  Persistent abnormal uterine bleeding. Serosal breach of the anterior rectal wall  PROCEDURE: ROBOTIC SEROSAL REPAIR OF RECTUM  SURGEON:  Adin Hector, MD  ASSISTANT:   (N/A)  ANESTHESIA:   general  Estimated Blood Loss (EBL):   Total I/O In: -  Out: 200 [Urine:200].   (See anesthesia record)  Delay start of Pharmacological VTE agent (>24hrs) due to concerns of significant anemia, surgical blood loss, or risk of bleeding?:  no  DRAINS: (None)  SPECIMEN:  (no specimen)  DISPOSITION OF SPECIMEN:  (not applicable)  COUNTS: To be determined.  PLAN OF CARE: Defer to Dr. Zoila Shutter  PATIENT DISPOSITION: Operating room.  Hemodynamically stable.  INDICATION: Woman with persistent abnormal uterine bleeding.  Consultation with gynecology.  Dr. Zoila Shutter recommended hysterectomy.  That has been done.  Please see her separate operative note.  On inspection there is concern of a source of breach in the anterior rectal wall.  Intraoperative consult requested.  OR FINDINGS: 1 cm vertical clean serosal breach on right anterior proximal rectal wall.  Mucosa exposed but no full-thickness proctotomy noted .  Size 1 cm.  Primarily repaired.  CASE DATA:  Type of patient?: Elective [facility] Private Case  Status of Case? Elective Scheduled  Infection Present At Time Of Surgery (PATOS)?  NO  DESCRIPTION: Into the room.  Patient was already intubated and had ports placed with robot docked.  Hysterectomy had already been performed and she had just completed sewing the vaginal cuff.  Please see Dr. Fanny Bien note.  She noted the area of concern.  Small serosal injury on the right anterior rectal wall.  I recommeded suture repair.  She allowed me to assume control of the case.  Used a 2-0 Vicryl  suture and closed in 2 layers.  1 horizontal mattress suture and 1 figure-of-eight suture.  Provide good closure.  .  I did inspection of the rectum up to the descending colon and the ileocecal region to the proximal ileum.  No evidence of injury.  Atrophic ovaries in place.  No bladder injury or other concern.  Periumbilical greater omental adhesions anterior abdominal wall draped down but no colon involvement.  No evidence of injury or other concern elsewhere.  I returned the case to Dr. Zoila Shutter.  Please see her operative note.    Adin Hector, M.D., F.A.C.S. Gastrointestinal and Minimally Invasive Surgery [facility] [address] [place], [place] [address] [phone] Main / Paging

## 2022-08-12 NOTE — Anesthesia Postprocedure Evaluation (Signed)
Anesthesia Post Note  Patient: Ruth [name]  Procedure(s) Performed: XI ROBOTIC ASSISTED TOTAL HYSTERECTOMY WITH SALPINGECTOMY (Bilateral: Abdomen) CYSTOSCOPY (Bladder)     Patient location during evaluation: PACU Anesthesia Type: General Level of consciousness: awake and alert Pain management: pain level controlled Vital Signs Assessment: post-procedure vital signs reviewed and stable Respiratory status: spontaneous breathing, nonlabored ventilation, respiratory function stable and patient connected to nasal cannula oxygen Cardiovascular status: blood pressure returned to baseline and stable Postop Assessment: no apparent nausea or vomiting Anesthetic complications: no  No notable events documented.  Last Vitals:  Vitals:   08/12/22 1430 08/12/22 1431  BP: (!) 157/91 (!) 157/91  Pulse: 72 71  Resp: 13 (!) 23  Temp:    SpO2: 97% 98%    Last Pain:  Vitals:   08/12/22 1431  TempSrc:   PainSc: 10-Worst pain ever                 Amsi Grimley S

## 2022-08-13 ENCOUNTER — Encounter (HOSPITAL_BASED_OUTPATIENT_CLINIC_OR_DEPARTMENT_OTHER): Payer: Self-pay | Admitting: Obstetrics and Gynecology

## 2022-08-13 ENCOUNTER — Other Ambulatory Visit: Payer: Self-pay | Admitting: Obstetrics and Gynecology

## 2022-08-13 DIAGNOSIS — N946 Dysmenorrhea, unspecified: Secondary | ICD-10-CM

## 2022-08-13 DIAGNOSIS — R9389 Abnormal findings on diagnostic imaging of other specified body structures: Secondary | ICD-10-CM

## 2022-08-13 DIAGNOSIS — N939 Abnormal uterine and vaginal bleeding, unspecified: Secondary | ICD-10-CM | POA: Diagnosis not present

## 2022-08-13 DIAGNOSIS — D252 Subserosal leiomyoma of uterus: Secondary | ICD-10-CM

## 2022-08-13 DIAGNOSIS — N945 Secondary dysmenorrhea: Secondary | ICD-10-CM

## 2022-08-13 LAB — CBC
HCT: 27.8 % — ABNORMAL LOW (ref 36.0–46.0)
Hemoglobin: 8.3 g/dL — ABNORMAL LOW (ref 12.0–15.0)
MCH: 23.1 pg — ABNORMAL LOW (ref 26.0–34.0)
MCHC: 29.9 g/dL — ABNORMAL LOW (ref 30.0–36.0)
MCV: 77.2 fL — ABNORMAL LOW (ref 80.0–100.0)
Platelets: 343 10*3/uL (ref 150–400)
RBC: 3.6 MIL/uL — ABNORMAL LOW (ref 3.87–5.11)
RDW: 18 % — ABNORMAL HIGH (ref 11.5–15.5)
WBC: 6.8 10*3/uL (ref 4.0–10.5)
nRBC: 0 % (ref 0.0–0.2)

## 2022-08-13 LAB — BASIC METABOLIC PANEL
Anion gap: 8 (ref 5–15)
BUN: 9 mg/dL (ref 6–20)
CO2: 25 mmol/L (ref 22–32)
Calcium: 8.1 mg/dL — ABNORMAL LOW (ref 8.9–10.3)
Chloride: 103 mmol/L (ref 98–111)
Creatinine, Ser: 0.74 mg/dL (ref 0.44–1.00)
GFR, Estimated: >60 mL/min (ref >60)
Glucose, Bld: 160 mg/dL — ABNORMAL HIGH (ref 70–99)
Potassium: 3.6 mmol/L (ref 3.5–5.1)
Sodium: 136 mmol/L (ref 135–145)

## 2022-08-13 LAB — GLUCOSE, CAPILLARY: Glucose-Capillary: 162 mg/dL — ABNORMAL HIGH (ref 70–99)

## 2022-08-13 LAB — SURGICAL PATHOLOGY

## 2022-08-13 MED ORDER — ACETAMINOPHEN 500 MG PO TABS
ORAL_TABLET | ORAL | Status: AC
  Filled 2022-08-13: qty 2

## 2022-08-13 MED ORDER — KETOROLAC TROMETHAMINE 30 MG/ML IJ SOLN
INTRAMUSCULAR | Status: AC
  Filled 2022-08-13: qty 1

## 2022-08-13 MED ORDER — POLYETHYLENE GLYCOL 3350 17 G PO PACK: 17.0000 g | PACK | Freq: Every day | ORAL | 0 refills | Status: DC | PRN

## 2022-08-13 MED ORDER — DOCUSATE SODIUM 100 MG PO CAPS
ORAL_CAPSULE | ORAL | Status: AC
  Filled 2022-08-13: qty 1

## 2022-08-13 MED ORDER — OXYCODONE HCL 5 MG PO TABS: 5.0000 mg | ORAL_TABLET | ORAL | 0 refills | Status: DC | PRN

## 2022-08-13 MED ORDER — INSULIN ASPART 100 UNIT/ML IJ SOLN
INTRAMUSCULAR | Status: AC
  Filled 2022-08-13: qty 1

## 2022-08-13 MED ORDER — OXYCODONE HCL 5 MG PO TABS
ORAL_TABLET | ORAL | Status: AC
  Filled 2022-08-13: qty 2

## 2022-08-13 MED ORDER — OYSTER SHELL CALCIUM/D3 500-5 MG-MCG PO TABS: 1.0000 | ORAL_TABLET | Freq: Every day | ORAL | 0 refills | Status: DC

## 2022-08-13 MED ORDER — ENOXAPARIN SODIUM 40 MG/0.4ML IJ SOSY
PREFILLED_SYRINGE | INTRAMUSCULAR | Status: AC
  Filled 2022-08-13: qty 0.4

## 2022-08-13 NOTE — Progress Notes (Signed)
Gynecology Progress Note  Admission Date: 08/12/2022 Current Date: 08/13/2022 7:27 AM  Ruth [name] is a 47 y.o. [id number] HD#2/POD#! admitted for postop pain management and observation w/ central line access   ROS and patient/family/surgical history, located on admission H&P note dated 08/12/2022, have been reviewed, and there are no changes except as noted below Yesterday/Overnight Events:  S/p RA-TLH, BS, cysto and rectal serosa repair by general surgeon  Subjective:  Doing well this morning. Has been ambulatory, voiding, tolerating po and no vaginal bleeding. Pain is well controlled.    Objective:   Vitals:   08/12/22 1700 08/12/22 2100 08/13/22 0151 08/13/22 0609  BP: (!) 167/97 (!) 141/88 125/73 (!) 149/79  Pulse: 71 81 74 78  Resp: '16 20 18 18  '$ Temp: (!) 97.5 F (36.4 C) 98.3 F (36.8 C) 98.1 F (36.7 C) 98 F (36.7 C)  TempSrc:  Oral Oral   SpO2: 98% 99% 96% 100%  Weight:      Height:        Temp:  [97.5 F (36.4 C)-98.3 F (36.8 C)] 98 F (36.7 C) (02/08 0609) Pulse Rate:  [71-86] 78 (02/08 0609) Resp:  [0-24] 18 (02/08 0609) BP: (125-167)/(73-108) 149/79 (02/08 0609) SpO2:  [92 %-100 %] 100 % (02/08 0609) I/O last 3 completed shifts: In: 2153.5 [P.O.:536; I.V.:1617.5] Out: 1950 [Urine:1900; Blood:50] No intake/output data recorded.  Intake/Output Summary (Last 24 hours) at 08/13/2022 0727 Last data filed at 08/13/2022 2130 Gross per 24 hour  Intake 2153.5 ml  Output 1950 ml  Net 203.5 ml     Current Vital Signs 24h Vital Sign Ranges  T 98 F (36.7 C) Temp  Avg: 97.8 F (36.6 C)  Min: 97.5 F (36.4 C)  Max: 98.3 F (36.8 C)  BP (!) 149/79 BP  Min: 125/73  Max: 167/97  HR 78 Pulse  Avg: 76.7  Min: 71  Max: 86  RR 18 Resp  Avg: 14.3  Min: 0  Max: 24  SaO2 100 % Room Air SpO2  Avg: 96 %  Min: 92 %  Max: 100 %       24 Hour I/O Current Shift I/O  Time Ins Outs 02/07 0701 - 02/08 0700 In: 2153.5 [P.O.:536; I.V.:1617.5] Out: 1950 [Urine:1900]  No intake/output data recorded.   Patient Vitals for the past 12 hrs:  BP Temp Temp src Pulse Resp SpO2  08/13/22 0609 (!) 149/79 98 F (36.7 C) -- 78 18 100 %  08/13/22 0151 125/73 98.1 F (36.7 C) Oral 74 18 96 %  08/12/22 2100 (!) 141/88 98.3 F (36.8 C) Oral 81 20 99 %     Patient Vitals for the past 24 hrs:  BP Temp Temp src Pulse Resp SpO2  08/13/22 0609 (!) 149/79 98 F (36.7 C) -- 78 18 100 %  08/13/22 0151 125/73 98.1 F (36.7 C) Oral 74 18 96 %  08/12/22 2100 (!) 141/88 98.3 F (36.8 C) Oral 81 20 99 %  08/12/22 1700 (!) 167/97 (!) 97.5 F (36.4 C) -- 71 16 98 %  08/12/22 1600 (!) 162/99 (!) 97.5 F (36.4 C) -- 75 16 99 %  08/12/22 1530 (!) 148/99 (!) 97.5 F (36.4 C) -- 79 14 97 %  08/12/22 1515 (!) 166/94 -- -- 81 (!) 0 97 %  08/12/22 1500 (!) 155/98 -- -- 76 14 92 %  08/12/22 1458 (!) 155/98 -- -- 78 15 94 %  08/12/22 1453 -- -- -- 73 (!) 9 94 %  08/12/22 1447 (!) 163/108 -- -- 73 (!) 9 96 %  08/12/22 1445 -- -- -- 73 12 96 %  08/12/22 1437 -- -- -- 74 18 97 %  08/12/22 1431 (!) 157/91 -- -- 71 (!) 23 98 %  08/12/22 1430 (!) 157/91 -- -- 72 13 97 %  08/12/22 1415 (!) 165/104 -- -- 76 (!) 0 93 %  08/12/22 1400 (!) 155/100 -- -- 79 (!) 24 95 %  08/12/22 1347 -- -- -- 79 18 94 %  08/12/22 1345 139/84 -- -- 84 14 93 %  08/12/22 1343 (!) 150/92 97.8 F (36.6 C) -- 86 15 94 %    Physical exam: General appearance: alert, cooperative, and appears stated age Abdomen:  soft, appropriately tender GU: No gross VB Lungs: clear to auscultation bilaterally Heart: S1, S2 normal, no murmur, rub or gallop, regular rate and rhythm Extremities: no lower extremity edema Skin: intact, incision w/ skin glue, well approximated without erythema Psych: appropriate Neurologic: Grossly normal  Medications Current Facility-Administered Medications  Medication Dose Route Frequency Provider Last Rate Last Admin   acetaminophen (TYLENOL) tablet 1,000 mg  1,000 mg Oral Q6H  Tacey Dimaggio, MD   1,000 mg at 08/13/22 0610   docusate sodium (COLACE) capsule 100 mg  100 mg Oral BID Dadrian Ballantine, MD   100 mg at 08/13/22 0718   enoxaparin (LOVENOX) injection 40 mg  40 mg Subcutaneous Q24H Nikolaos Maddocks, MD   40 mg at 08/13/22 1245   HYDROmorphone (DILAUDID) injection 0.2-0.4 mg  0.2-0.4 mg Intravenous Q2H PRN Nixon Sparr, MD       HYDROmorphone (DILAUDID) injection 0.25-0.5 mg  0.25-0.5 mg Intravenous Q5 min PRN Myrtie Soman, MD   0.25 mg at 08/12/22 1451   hydrOXYzine (ATARAX) tablet 25 mg  25 mg Oral TID PRN Darliss Cheney, MD       ketorolac (TORADOL) 30 MG/ML injection 30 mg  30 mg Intravenous Q6H Lenea Bywater, MD   30 mg at 08/13/22 0150   Followed by   ibuprofen (ADVIL) tablet 600 mg  600 mg Oral Q6H Man Effertz, MD       insulin aspart (novoLOG) injection 0-15 Units  0-15 Units Subcutaneous TID WC Mahogony Gilchrest, MD   3 Units at 08/13/22 0725   lactated ringers infusion   Intravenous Continuous Evanell Redlich, MD 30 mL/hr at 08/13/22 0158 New Bag at 08/13/22 0158   OLANZapine (ZYPREXA) tablet 5 mg  5 mg Oral QHS Amie Cowens, MD   5 mg at 08/12/22 2148   ondansetron (ZOFRAN) injection 4 mg  4 mg Intravenous Once PRN Myrtie Soman, MD       ondansetron [facility]) tablet 4 mg  4 mg Oral Q6H PRN Darliss Cheney, MD       Or   ondansetron (ZOFRAN) injection 4 mg  4 mg Intravenous Q6H PRN Trayce Caravello, MD       oxyCODONE (Oxy IR/ROXICODONE) immediate release tablet 5 mg  5 mg Oral Once PRN Myrtie Soman, MD       Or   oxyCODONE (ROXICODONE) 5 MG/5ML solution 5 mg  5 mg Oral Once PRN Myrtie Soman, MD       oxyCODONE (Oxy IR/ROXICODONE) immediate release tablet 5-10 mg  5-10 mg Oral Q4H PRN Laporchia Nakajima, MD   10 mg at 08/13/22 0149   polyethylene glycol (MIRALAX / GLYCOLAX) packet 17 g  17 g Oral Daily PRN Darliss Cheney, MD       senna (SENOKOT) tablet 8.6  mg  1 tablet Oral BID Darliss Cheney, MD    8.6 mg at 08/12/22 2148      Labs  Recent Labs  Lab 08/10/22 1105 08/13/22 0605  WBC 3.1* 6.8  HGB 9.3* 8.3*  HCT 31.0* 27.8*  PLT 411* 343    Recent Labs  Lab 08/10/22 1105 08/13/22 0605  NA 137 136  K 3.6 3.6  CL 102 103  CO2 26 25  BUN 11 9  CREATININE 0.69 0.74  CALCIUM 8.6* 8.1*  GLUCOSE 158* 160*    Radiology Study Result  Narrative & Impression  CLINICAL DATA:  Central line placement   EXAM: PORTABLE CHEST 1 VIEW   COMPARISON:  10/29/2021   FINDINGS: Right IJ approach central venous catheter terminates at the level of the superior cavoatrial junction. Stable heart size. Low lung volumes. New mass-like opacity in the right paratracheal region. Left basilar atelectasis. No pleural effusion or pneumothorax.   IMPRESSION: 1. Right IJ approach central venous catheter terminates at the level of the superior cavoatrial junction. No pneumothorax. 2. New mass-like opacity in the right paratracheal region. CT of the chest with contrast is recommended for further evaluation.   These results will be called to the ordering clinician or representative by the Radiologist Assistant, and communication documented in the PACS or [employer].    Assessment & Plan:  Doing well and meeting postop goals *GYN: no bleeding, follow up surgical pathology  * ENDO: will need to resume glucose monitoring at home *Pain: APAP/NSAIDS and oxy for breakthrough *FEN/GI: diabetic regular diet, d/c central line this AM prior to discharge *PPx: lovenox for VTE risk w/ central access in lace *Dispo: home this AM  Code Status: Full Code   Darliss Cheney, MD [facility] [place] [facility] (Faculty Practice) 08/13/22 7:27 AM

## 2022-08-13 NOTE — Discharge Instructions (Addendum)
Take ibuprofen and tylenol every 6 hours for at least the first 3 days regardless of how you feel. Resume your ozempic next week. Continue your anxiety meds today. Start taking calcium supplements and take for the next 2 weeks. We will re-check your calcium level in about a week or so. If they are still low, you will need to follow up with your primary care.   You will need another image of your neck/chest area postoperatively.

## 2022-08-13 NOTE — Discharge Summary (Signed)
Physician Discharge Summary  Patient ID: Ruth [name] MRN: [id number] DOB/AGE: [birth year] 47 y.o.  Admit date: 08/12/2022 Discharge date: 08/13/2022  Admission Diagnoses: AUB  Discharge Diagnoses:  Principal Problem:   Abnormal uterine bleeding (AUB)   Discharged Condition: stable  Hospital Course: Patient presented for scheduled RA-TLH, BS, cysto. Procedure complicated by rectal serosal tear repaired by general surgery. Patient   Consults: general surgery and IV team  Significant Diagnostic Studies: labs: mild hypocalcemia, elevated glucose and radiology: CXR: mass: right paratracheal area, confirmed central line in correct location lobe   Treatments: analgesia: acetaminophen, ibuprofen, oxycodone, zofran and insulin: novolog  Discharge Exam: Blood pressure (!) 149/79, pulse 78, temperature 98 F (36.7 C), resp. rate 18, height 5' 6"$  (1.676 m), weight 115.2 kg, last menstrual period 08/01/2022, SpO2 100 %. General appearance: alert, cooperative, and no distress Resp: clear to auscultation bilaterally Cardio: regular rate and rhythm, S1, S2 normal, no murmur, click, rub or gallop Skin: Skin color, texture, turgor normal. No rashes or lesions Neurologic: Grossly normal Incision/Wound: incisions clean/dry/intact  Disposition: Discharge disposition: 01-Home or Self Care        Allergies as of 08/13/2022       Reactions   Metformin And Related Diarrhea   Penicillins Other (See Comments)   Unknown; from childhood Has patient had a PCN reaction causing immediate rash, facial/tongue/throat swelling, SOB or lightheadedness with hypotension: Unknow Has patient had a PCN reaction causing severe rash involving mucus membranes or skin necrosis: Unknown Has patient had a PCN reaction that required hospitalization: Unknown Has patient had a PCN reaction occurring within the last 10 years: Unknown If all of the above answers are "NO", then may proceed with Cephalosporin  use.   Victoza [liraglutide] Itching        Medication List     STOP taking these medications    traMADol 50 MG tablet Commonly known as: ULTRAM       TAKE these medications    albuterol 108 (90 Base) MCG/ACT inhaler Commonly known as: VENTOLIN HFA Inhale into the lungs every 6 (six) hours as needed for wheezing or shortness of breath.   calcium-vitamin D 500-5 MG-MCG tablet Commonly known as: OSCAL WITH D Take 1 tablet by mouth daily with breakfast.   celecoxib 200 MG capsule Commonly known as: CELEBREX Take 200 mg by mouth daily.   Dexcom G7 Receiver Devi Use to check blood sugar three times a day .   Dexcom G7 Sensor Misc Use to check blood sugar three time daily. Change sensors once every 10 days.   hydrOXYzine 25 MG tablet Commonly known as: ATARAX Take 1 tablet (25 mg total) by mouth 3 (three) times daily as needed.   OLANZapine 5 MG tablet Commonly known as: ZyPREXA Take 1 tablet (5 mg total) by mouth at bedtime.   oxyCODONE 5 MG immediate release tablet Commonly known as: Oxy IR/ROXICODONE Take 1 tablet (5 mg total) by mouth every 4 (four) hours as needed for severe pain or breakthrough pain.   Ozempic (0.25 or 0.5 MG/DOSE) 2 MG/3ML Sopn Generic drug: Semaglutide(0.25 or 0.5MG/DOS) Inject 0.5 mg into the skin once a week.   polyethylene glycol 17 g packet Commonly known as: MIRALAX / GLYCOLAX Take 17 g by mouth daily as needed for mild constipation.         Signed: Darliss Cheney 08/13/2022, 8:23 AM

## 2022-08-16 LAB — BPAM RBC
Blood Product Expiration Date: 202403052359
Unit Type and Rh: 7300

## 2022-08-16 LAB — TYPE AND SCREEN
ABO/RH(D): B POS
Antibody Screen: NEGATIVE
Unit division: 0

## 2022-08-17 ENCOUNTER — Encounter: Payer: Self-pay | Admitting: Obstetrics and Gynecology

## 2022-08-18 ENCOUNTER — Ambulatory Visit (INDEPENDENT_AMBULATORY_CARE_PROVIDER_SITE_OTHER): Payer: Medicaid Other | Admitting: General Practice

## 2022-08-18 ENCOUNTER — Encounter (HOSPITAL_COMMUNITY): Payer: Self-pay

## 2022-08-18 ENCOUNTER — Ambulatory Visit (HOSPITAL_COMMUNITY)
Admission: RE | Admit: 2022-08-18 | Discharge: 2022-08-18 | Disposition: A | Discharge status: Home / Self Care | Payer: Medicaid Other | Source: Ambulatory Visit | Attending: Obstetrics and Gynecology | Admitting: Obstetrics and Gynecology

## 2022-08-18 ENCOUNTER — Other Ambulatory Visit: Payer: Self-pay

## 2022-08-18 VITALS — BP 148/68 | HR 83 | Ht 66.0 in | Wt 256.0 lb

## 2022-08-18 DIAGNOSIS — R9389 Abnormal findings on diagnostic imaging of other specified body structures: Secondary | ICD-10-CM | POA: Insufficient documentation

## 2022-08-18 DIAGNOSIS — R918 Other nonspecific abnormal finding of lung field: Secondary | ICD-10-CM | POA: Diagnosis not present

## 2022-08-18 DIAGNOSIS — R309 Painful micturition, unspecified: Secondary | ICD-10-CM | POA: Diagnosis not present

## 2022-08-18 LAB — POCT URINALYSIS DIP (DEVICE)
Bilirubin Urine: NEGATIVE
Glucose, UA: NEGATIVE mg/dL
Hgb urine dipstick: NEGATIVE
Ketones, ur: NEGATIVE mg/dL
Leukocytes,Ua: NEGATIVE
Nitrite: NEGATIVE
Protein, ur: NEGATIVE mg/dL
Specific Gravity, Urine: 1.01 (ref 1.005–1.030)
Urobilinogen, UA: 0.2 mg/dL (ref 0.0–1.0)
pH: 5.5 (ref 5.0–8.0)

## 2022-08-18 MED ORDER — PHENAZOPYRIDINE HCL 200 MG PO TABS: 200.0000 mg | ORAL_TABLET | Freq: Three times a day (TID) | ORAL | 0 refills | Status: DC | PRN

## 2022-08-18 MED ORDER — IOHEXOL 300 MG/ML  SOLN
75.0000 mL | Freq: Once | INTRAMUSCULAR | Status: AC | PRN
  Administered 2022-08-18: 75 mL via INTRAVENOUS

## 2022-08-18 MED ORDER — SODIUM CHLORIDE (PF) 0.9 % IJ SOLN
INTRAMUSCULAR | Status: AC
  Filled 2022-08-18: qty 50

## 2022-08-18 NOTE — Progress Notes (Signed)
Patient presents to office today reporting pelvic pain with urination since surgery that goes away after she pees- rates the pain at a 4. Denies dysuria, flank pain, incomplete emptying or frequency. UA unremarkable. Discussed with Dr Currie Paris who recommends pyridium for 3 days for relief of bladder spasm from surgery. Patient has appt on 2/15 for post op follow up.   Koren Bound RN BSN 08/18/22

## 2022-08-19 ENCOUNTER — Encounter: Payer: Self-pay | Admitting: Obstetrics and Gynecology

## 2022-08-19 LAB — BASIC METABOLIC PANEL
BUN/Creatinine Ratio: 14 (ref 9–23)
BUN: 10 mg/dL (ref 6–24)
CO2: 21 mmol/L (ref 20–29)
Calcium: 9 mg/dL (ref 8.7–10.2)
Chloride: 105 mmol/L (ref 96–106)
Creatinine, Ser: 0.71 mg/dL (ref 0.57–1.00)
Glucose: 214 mg/dL — ABNORMAL HIGH (ref 70–99)
Potassium: 4.4 mmol/L (ref 3.5–5.2)
Sodium: 142 mmol/L (ref 134–144)
eGFR: 105 mL/min/{1.73_m2} (ref >59)

## 2022-08-20 ENCOUNTER — Other Ambulatory Visit: Payer: Medicaid Other

## 2022-08-24 ENCOUNTER — Other Ambulatory Visit (HOSPITAL_COMMUNITY): Payer: Self-pay

## 2022-08-27 ENCOUNTER — Encounter: Payer: Self-pay | Admitting: Obstetrics and Gynecology

## 2022-08-27 ENCOUNTER — Ambulatory Visit (INDEPENDENT_AMBULATORY_CARE_PROVIDER_SITE_OTHER): Payer: Medicaid Other | Admitting: Obstetrics and Gynecology

## 2022-08-27 ENCOUNTER — Other Ambulatory Visit: Payer: Self-pay

## 2022-08-27 VITALS — BP 130/92 | HR 84

## 2022-08-27 DIAGNOSIS — Z09 Encounter for follow-up examination after completed treatment for conditions other than malignant neoplasm: Secondary | ICD-10-CM | POA: Diagnosis not present
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
## 2022-09-04 ENCOUNTER — Other Ambulatory Visit: Payer: Self-pay

## 2022-09-09 ENCOUNTER — Other Ambulatory Visit: Payer: Self-pay | Admitting: Obstetrics and Gynecology

## 2022-09-22 ENCOUNTER — Other Ambulatory Visit: Payer: Self-pay

## 2022-09-24 ENCOUNTER — Encounter: Payer: Self-pay | Admitting: Obstetrics and Gynecology

## 2022-09-24 ENCOUNTER — Other Ambulatory Visit: Payer: Self-pay

## 2022-09-24 ENCOUNTER — Ambulatory Visit (INDEPENDENT_AMBULATORY_CARE_PROVIDER_SITE_OTHER): Payer: Medicaid Other | Admitting: Obstetrics and Gynecology

## 2022-09-24 VITALS — BP 136/93 | HR 82 | Wt 254.3 lb

## 2022-09-24 DIAGNOSIS — Z09 Encounter for follow-up examination after completed treatment for conditions other than malignant neoplasm: Secondary | ICD-10-CM

## 2022-09-24 DIAGNOSIS — Z658 Other specified problems related to psychosocial circumstances: Secondary | ICD-10-CM

## 2022-09-24 MED ORDER — VALACYCLOVIR HCL 1 G PO TABS: 1000.0000 mg | ORAL_TABLET | Freq: Two times a day (BID) | ORAL | 2 refills | Status: DC

## 2022-09-24 NOTE — Progress Notes (Signed)
   POSTOPERATIVE VISIT NOTE   Subjective:     Ruth [name] is a 47 y.o. [phone] who presents to the clinic 6 weeks RA-TLH, BS, cysto  for abnormal uterine bleeding. Eating a regular diet without difficulty. Bowel movements are normal. Pain is well controlled.   Complaint of not sleeping well at night. Scheduled to return to work in 2 weeks. Has not had anything per vagina.    Review of Systems Pertinent items are noted in HPI.    Objective:    BP (!) 143/97   Pulse 82   Wt 254 lb 4.8 oz (115.3 kg)   LMP 07/05/2022   BMI 41.05 kg/m  General:  alert and cooperative  Abdomen: soft, bowel sounds active, non-tender  Incision:   healing well, no drainage, no erythema, no hernia, no seroma, no swelling, no dehiscence, incision well approximated, small scab/suture in place  Pelvic Normal external genitalia, intact vaginal cuff on visual inspection and on palpation, nontender       Assessment:   Doing well postoperatively. Operative findings again reviewed. Pathology report discussed.    Plan:   1. Reviewed wound care instructions 2. Discussed psychosocial issues and recommendation for MH as well. Declined [facility] referral at this time but ok w/ following up with PCP who prescribes her anxiety and bipolar medications. Husband currently undergoing meetings/treatment for alcohol use.  3. Activity restrictions: none 4. Anticipated return to work: 2-3 weeks. 5. Follow up as needed   Darliss Cheney, MD [place], [facility] for [employer], [place]

## 2022-09-25 ENCOUNTER — Telehealth: Payer: Self-pay

## 2022-09-25 NOTE — Telephone Encounter (Signed)
Called pharmacy to notify them Valtrex has been cancelled by provider.

## 2022-10-03 ENCOUNTER — Ambulatory Visit (HOSPITAL_COMMUNITY)
Admission: EM | Admit: 2022-10-03 | Discharge: 2022-10-03 | Discharge status: Against Medical Advice | Payer: Medicaid Other

## 2022-10-04 ENCOUNTER — Encounter (HOSPITAL_COMMUNITY): Payer: Self-pay

## 2022-10-04 ENCOUNTER — Ambulatory Visit (HOSPITAL_COMMUNITY)
Admission: RE | Admit: 2022-10-04 | Discharge: 2022-10-04 | Disposition: A | Discharge status: Home / Self Care | Payer: Medicaid Other | Source: Ambulatory Visit

## 2022-10-04 VITALS — BP 117/83 | HR 93 | Temp 98.0°F | Resp 17

## 2022-10-04 DIAGNOSIS — L02214 Cutaneous abscess of groin: Secondary | ICD-10-CM

## 2022-10-04 DIAGNOSIS — J029 Acute pharyngitis, unspecified: Secondary | ICD-10-CM

## 2022-10-04 MED ORDER — FLUTICASONE PROPIONATE 50 MCG/ACT NA SUSP: 1.0000 | Freq: Every day | NASAL | 2 refills | Status: DC

## 2022-10-04 MED ORDER — DOXYCYCLINE HYCLATE 100 MG PO CAPS: 100.0000 mg | ORAL_CAPSULE | Freq: Two times a day (BID) | ORAL | 0 refills | Status: DC

## 2022-10-04 MED ORDER — CETIRIZINE HCL 10 MG PO TABS: 10.0000 mg | ORAL_TABLET | Freq: Every day | ORAL | 2 refills | Status: DC

## 2022-10-04 NOTE — ED Triage Notes (Signed)
Pt c/o sore throat for 2 days.   Reports right groin abscess for about 3 days. Tried heat and soaking. Denies drainage.

## 2022-10-04 NOTE — ED Provider Notes (Signed)
[facility]    CSN: [id number] Arrival date & time: 10/04/22  1020      History   Chief Complaint Chief Complaint  Patient presents with   Abscess    Sore throat - Entered by patient   appt 1030   Sore Throat    HPI Ruth [name] is a 47 y.o. female.   Sore throat for the past two days, reports she thinks it is allergies.  Denies fevers, cough, nasal congestion or recent sick contacts.  She has not tried anything for her sore throat.  Patient also endorses an abscess to her right groin area for the past 3 days.  She has tried warm compresses and soaking.  Denies any drainage.  She does have a history of HS. reports frequent abscesses, usually to axilla. She is also diabetic.     The history is provided by the patient and medical records.  Abscess Associated symptoms: no fatigue   Sore Throat Pertinent negatives include no chest pain, no abdominal pain and no shortness of breath.    Past Medical History:  Diagnosis Date   Abnormal uterine bleeding 2023   Anemia 2023   Anxiety    Arthritis    chronic back pain    Asthma    rarely uses inhaler   Bipolar 1 disorder (HCC)    Follows w/ PCP Dr. Dorna Mai.   Fibromyalgia    GERD (gastroesophageal reflux disease)    Hidradenitis    multiple boils, axilla and groin (As of 08/03/22, patient has a boil in her left axilla.)   HLD (hyperlipidemia)    diet controlled   Hx of migraines    occ   Hypertension    no meds   Hypothyroidism    Intentional drug overdose ([place]) 03/28/2021   Obesity    Sleep apnea    Patient states that she had a sleep study in the past and was told she did not need a CPAP.   Thrombocytosis 01/26/2020   Type 2 diabetes mellitus ([place])    Type 2, Follows w/ PCP Dr. Dorna Mai.   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   BMI 45.0-49.9, adult ([place]) 09/04/2020   Right arm  pain 02/14/2020   Hyperlipidemia associated with type 2 diabetes mellitus ([place]) 09/19/2019   Abnormal uterine bleeding (AUB) 12/07/2017   Abnormal mammogram of both breasts 11/02/2016   Hypothyroidism 06/21/2009   Type 2 diabetes mellitus with peripheral neuropathy ([place]) 06/21/2009   BIPOLAR AFFECTIVE DISORDER 06/21/2009   PERSISTENT DISORDER INITIATING/MAINTAINING SLEEP 06/21/2009   INADEQUATE SLEEP HYGIENE 06/21/2009   OBSTRUCTIVE SLEEP APNEA 06/21/2009   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   CARPAL TUNNEL SYNDROME 06/20/2009   MERALGIA PARESTHETICA 06/20/2009   Essential hypertension 06/20/2009   FIBROMYALGIA 06/20/2009   CEPHALGIA 06/20/2009    Past Surgical History:  Procedure Laterality Date   ANTERIOR CERVICAL DECOMP/DISCECTOMY FUSION N/A 01/16/2021   Procedure: Cervical five-six Anterior cervical decompression/discectomy/fusion;  Surgeon: Consuella Lose, MD;  Location: [facility];  Service: Neurosurgery;  Laterality: N/A;   CARPAL TUNNEL RELEASE Right    around 2009   [place]     x2, 2003 & 2008   CYSTOSCOPY N/A 08/12/2022   Procedure: CYSTOSCOPY;  Surgeon: Darliss Cheney, MD;  Location: [facility];  Service: Gynecology;  Laterality: N/A;   DILITATION & CURRETTAGE/HYSTROSCOPY WITH NOVASURE ABLATION N/A 12/07/2017  Procedure: DILATATION & CURETTAGE/HYSTEROSCOPY WITH ATTEMPED NOVASURE ABLATION, FAILED/ENDOMETERIAL ABLATION WITH HTA SYSTEM;  Surgeon: Lavonia Drafts, MD;  Location: [facility];  Service: Gynecology;  Laterality: N/A;   ELBOW SURGERY     "surgery for nerve pain" around 2009   ENDOMETRIAL BIOPSY  07/23/2022   negative for hyperplasia or malignancy   GANGLION CYST EXCISION Right    right arm    RECTOVAGINAL FISTULA CLOSURE     in [id number], 2006   ROBOTIC ASSISTED TOTAL HYSTERECTOMY Bilateral 08/12/2022   Procedure: XI ROBOTIC ASSISTED TOTAL HYSTERECTOMY WITH SALPINGECTOMY;  Surgeon: Darliss Cheney, MD;  Location: [facility];  Service: Gynecology;  Laterality: Bilateral;   TONSILLECTOMY     childhood   TUBAL LIGATION  2008   XI ROBOT ASSISTED RECTOPEXY N/A 08/12/2022   Procedure: XI robot repair serosal repair of rectum;  Surgeon: Michael Boston, MD;  Location: [facility];  Service: General;  Laterality: N/A;    OB History     Gravida  7   Para  5   Term  4   Preterm  1   AB  2   Living  5      SAB  2   IAB      Ectopic      Multiple      Live Births  5        Obstetric Comments  Vag x 2>c/s emergency>vbac>c/s and BTL 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 Authorizing Provider  albuterol (VENTOLIN HFA) 108 (90 Base) MCG/ACT inhaler Inhale into the lungs. 05/03/16  Yes [provider]  cetirizine (ZYRTEC ALLERGY) 10 MG tablet Take 1 tablet (10 mg total) by mouth daily. 10/04/22 01/02/23 Yes Louretta Shorten, [place], FNP  doxycycline (VIBRAMYCIN) 100 MG capsule Take 1 capsule (100 mg total) by mouth 2 (two) times daily. 10/04/22  Yes Louretta Shorten, [place], FNP  fluticasone [facility]) 50 MCG/ACT nasal spray Place 1 spray into both nostrils daily. 10/04/22  Yes Louretta Shorten, [place], FNP  fluticasone (FLOVENT HFA) 110 MCG/ACT inhaler Inhale into the lungs. 11/11/16  Yes [provider]  medroxyPROGESTERone (PROVERA) 10 MG tablet Take by mouth. 08/07/16  Yes [provider]  metFORMIN (GLUCOPHAGE) 500 MG tablet Take by mouth. 11/11/16  Yes [provider]  montelukast (SINGULAIR) 10 MG tablet Take by mouth. 11/11/16  Yes [provider]  traMADol (ULTRAM) 50 MG tablet Take by mouth. 11/11/16  Yes [provider]  valACYclovir (VALTREX) 1000 MG tablet Take 1,000 mg by mouth 2 (two) times daily. 09/24/22  Yes [provider]  albuterol (VENTOLIN HFA) 108 (90 Base) MCG/ACT inhaler Inhale into the lungs every 6  (six) hours as needed for wheezing or shortness of breath.    [provider]  Continuous Blood Gluc Receiver (DEXCOM G7 RECEIVER) DEVI Use to check blood sugar three times a day . 04/08/22   Charlott Rakes, MD  hydrOXYzine (ATARAX) 25 MG tablet Take 1 tablet (25 mg total) by mouth 3 (three) times daily as needed. 07/13/22   Dorna Mai, MD  OLANZapine (ZYPREXA) 5 MG tablet Take 1 tablet (5 mg total) by mouth at bedtime. 08/10/22   Dorna Mai, MD  Semaglutide,0.25 or 0.5MG /DOS, (OZEMPIC, 0.25 OR 0.5 MG/DOSE,) 2 MG/3ML SOPN Inject 0.5 mg into the skin once a week. 07/02/22  Charlott Rakes, MD    Family History Family History  Problem Relation Age of Onset   Thyroid disease Mother    Diabetes Daughter    Diabetes Son    Breast cancer Neg Hx     Social History Social History   Tobacco Use   Smoking status: Every Day    Packs/day: .25    Types: Cigarettes   Smokeless tobacco: Never   Tobacco comments:    Per patient, she only smokes 2 to 3 cigarettes daily as of 08/03/22.  Vaping Use   Vaping Use: Never used  Substance Use Topics   Alcohol use: Not Currently   Drug use: Yes    Types: Marijuana    Comment: Occasional use only     Allergies   Metformin and related, Penicillins, and Victoza [liraglutide]   Review of Systems Review of Systems  Constitutional:  Negative for appetite change, chills and fatigue.  HENT:  Positive for sore throat. Negative for congestion, dental problem, postnasal drip, rhinorrhea, sinus pressure, sinus pain, trouble swallowing and voice change.   Eyes:  Negative for discharge.  Respiratory:  Negative for cough and shortness of breath.   Cardiovascular:  Negative for chest pain.  Gastrointestinal:  Negative for abdominal pain.  Genitourinary:  Negative for dysuria.  Musculoskeletal:  Negative for arthralgias and back pain.  Neurological:  Negative for numbness.     Physical Exam Triage Vital Signs ED Triage Vitals  Enc Vitals  Group     BP 10/04/22 1041 117/83     Pulse Rate 10/04/22 1041 93     Resp 10/04/22 1041 17     Temp 10/04/22 1041 98 F (36.7 C)     Temp Source 10/04/22 1041 Oral     SpO2 10/04/22 1041 98 %     Weight --      Height --      Head Circumference --      Peak Flow --      Pain Score 10/04/22 1040 7     Pain Loc --      Pain Edu? --      Excl. in [place]? --    No data found.  Updated Vital Signs BP 117/83 (BP Location: Left Arm)   Pulse 93   Temp 98 F (36.7 C) (Oral)   Resp 17   LMP 07/05/2022   SpO2 98%   Visual Acuity Right Eye Distance:   Left Eye Distance:   Bilateral Distance:    Right Eye Near:   Left Eye Near:    Bilateral Near:     Physical Exam Vitals and nursing note reviewed.  Constitutional:      General: She is not in acute distress.    Appearance: She is well-developed.  HENT:     Head: Normocephalic and atraumatic.     Nose: No congestion or rhinorrhea.     Mouth/Throat:     Mouth: Mucous membranes are moist.     Pharynx: Uvula midline. Posterior oropharyngeal erythema present.     Tonsils: No tonsillar exudate or tonsillar abscesses. 2+ on the right. 2+ on the left.  Eyes:     Conjunctiva/sclera: Conjunctivae normal.  Cardiovascular:     Rate and Rhythm: Normal rate and regular rhythm.  Pulmonary:     Effort: Pulmonary effort is normal. No respiratory distress.  Musculoskeletal:        General: No swelling.     Cervical back: Neck supple.  Skin:    General:  Skin is warm and dry.     Capillary Refill: Capillary refill takes less than 2 seconds.     Findings: Abscess present.          Comments: Abscess to right groin approximately 5 cm x 3 cm with a central area of fluctuance and surrounding induration.  Neurological:     Mental Status: She is alert.  Psychiatric:        Mood and Affect: Mood normal.      UC Treatments / Results  Labs (all labs ordered are listed, but only abnormal results are displayed) Labs Reviewed - No data to  display  EKG   Radiology No results found.  Procedures Incision and Drainage  Date/Time: 10/04/2022 11:15 AM  Performed by: Clarisse Rodriges, [place], FNP Authorized by: Ramsay Bognar, [place], FNP   Consent:    Consent obtained:  Verbal   Consent given by:  Patient   Risks, benefits, and alternatives were discussed: yes     Risks discussed:  Incomplete drainage, pain and infection   Alternatives discussed:  No treatment and delayed treatment Universal protocol:    Procedure explained and questions answered to patient or proxy's satisfaction: yes     Patient identity confirmed:  Verbally with patient Location:    Type:  Abscess   Size:  5 cm x 3 cm   Location:  Trunk   Trunk location: R groin. Pre-procedure details:    Skin preparation:  Chlorhexidine with alcohol and povidone-iodine Sedation:    Sedation type:  None Anesthesia:    Anesthesia method:  Local infiltration   Local anesthetic:  Lidocaine 1% WITH epi Procedure type:    Complexity:  Complex Procedure details:    Ultrasound guidance: no     Needle aspiration: yes     Needle size:  22 G   Incision types:  Single straight   Wound management:  Probed and deloculated   Drainage:  Purulent and bloody   Drainage amount:  Moderate   Wound treatment:  Wound left open   Packing materials:  None Post-procedure details:    Procedure completion:  Tolerated well, no immediate complications  (including critical care time)  Medications Ordered in UC Medications - No data to display  Initial Impression / Assessment and Plan / UC Course  I have reviewed the triage vital signs and the nursing notes.  Pertinent labs & imaging results that were available during my care of the patient were reviewed by me and considered in my medical decision making (see chart for details).  Vitals in triage reviewed, patient is hemodynamically stable.  Posterior pharynx with moderate erythema, uvula midline, tonsils with 2+ edema.  Low concern  for peritonsillar abscess or acute bacterial process.  Will restart on antihistamines and treat for allergic rhinitis/pharyngitis.  Abscess to right groin with central area of fluctuance.  Cleansed site with wound cleaner, chlorhexidine and iodine.  3 cc of 1% lidocaine with epi used to obtain anesthesia as well as topical pain ease spray.  Scalpel to produce single straight incision, abscess probed and deloculated.  Patient tolerated procedure well, will cover with doxycycline, wound care discussed.  Covered with gauze and paper tape.  Patient verbalized understanding of treatment plan, follow-up and return precautions discussed, no questions at this time.     Final Clinical Impressions(s) / UC Diagnoses   Final diagnoses:  Abscess of groin, right  Pharyngitis, unspecified etiology     Discharge Instructions      Please take all  antibiotics until finished.  You can take your antibiotics with food to help prevent gastrointestinal upset.  Please keep your wound clean and dry, you can do that gauze dressing with paper tape or just held in place with your clothing.  Please restart the Zyrtec and Flonase for your allergic symptoms.  For your symptomatic sore throat you can do warm saline gargles, tea with honey, and sleeping with a humidifier.  Cough drops and [name]-the-counter cough medicine can help as well.  Please return to clinic or follow-up care if no improvement to your pain, swelling or abscess in the next 48 to 72 hours.  Please seek follow-up care if you develop fever, worsening of pain or any changes.      ED Prescriptions     Medication Sig Dispense Auth. Provider   doxycycline (VIBRAMYCIN) 100 MG capsule Take 1 capsule (100 mg total) by mouth 2 (two) times daily. 20 capsule Louretta Shorten, [place], FNP   fluticasone [facility]) 50 MCG/ACT nasal spray Place 1 spray into both nostrils daily. 9.9 mL Alvan Culpepper, [place], FNP   cetirizine (ZYRTEC ALLERGY) 10 MG tablet Take 1 tablet (10  mg total) by mouth daily. 30 tablet Ardit Danh, [place], [place]      PDMP not reviewed this encounter.   Grabiel Schmutz, [place], [place] 10/04/22 1118

## 2022-10-04 NOTE — Discharge Instructions (Addendum)
Please take all antibiotics until finished.  You can take your antibiotics with food to help prevent gastrointestinal upset.  Please keep your wound clean and dry, you can do that gauze dressing with paper tape or just held in place with your clothing.  Please restart the Zyrtec and Flonase for your allergic symptoms.  For your symptomatic sore throat you can do warm saline gargles, tea with honey, and sleeping with a humidifier.  Cough drops and over-the-counter cough medicine can help as well.  Please return to clinic or follow-up care if no improvement to your pain, swelling or abscess in the next 48 to 72 hours.  Please seek follow-up care if you develop fever, worsening of pain or any changes.

## 2022-10-19 ENCOUNTER — Other Ambulatory Visit: Payer: Self-pay | Admitting: Family Medicine

## 2022-10-19 DIAGNOSIS — E1142 Type 2 diabetes mellitus with diabetic polyneuropathy: Secondary | ICD-10-CM

## 2022-10-20 ENCOUNTER — Other Ambulatory Visit: Payer: Self-pay | Admitting: Family

## 2022-10-20 ENCOUNTER — Other Ambulatory Visit: Payer: Self-pay

## 2022-10-20 ENCOUNTER — Other Ambulatory Visit: Payer: Self-pay | Admitting: Family Medicine

## 2022-10-20 DIAGNOSIS — E1169 Type 2 diabetes mellitus with other specified complication: Secondary | ICD-10-CM

## 2022-10-20 MED ORDER — OZEMPIC (0.25 OR 0.5 MG/DOSE) 2 MG/3ML ~~LOC~~ SOPN
0.5000 mg | PEN_INJECTOR | SUBCUTANEOUS | 2 refills | Status: DC
  Filled 2022-10-20: qty 3, 28d supply, fill #0
  Filled 2022-11-18: qty 3, 28d supply, fill #1
  Filled 2022-12-14 – 2022-12-21 (×2): qty 3, 28d supply, fill #2

## 2022-10-21 ENCOUNTER — Encounter (HOSPITAL_COMMUNITY): Payer: Self-pay | Admitting: Emergency Medicine

## 2022-10-21 ENCOUNTER — Ambulatory Visit (HOSPITAL_COMMUNITY)
Admission: EM | Admit: 2022-10-21 | Discharge: 2022-10-21 | Disposition: A | Discharge status: Home / Self Care | Payer: Medicaid Other | Attending: Emergency Medicine | Admitting: Emergency Medicine

## 2022-10-21 DIAGNOSIS — H60391 Other infective otitis externa, right ear: Secondary | ICD-10-CM

## 2022-10-21 DIAGNOSIS — J45901 Unspecified asthma with (acute) exacerbation: Secondary | ICD-10-CM | POA: Diagnosis not present

## 2022-10-21 MED ORDER — CARBAMIDE PEROXIDE 6.5 % OT SOLN: 5.0000 [drp] | Freq: Two times a day (BID) | OTIC | 0 refills | Status: DC

## 2022-10-21 MED ORDER — ALBUTEROL SULFATE (2.5 MG/3ML) 0.083% IN NEBU: 2.5000 mg | INHALATION_SOLUTION | Freq: Four times a day (QID) | RESPIRATORY_TRACT | 12 refills | Status: DC | PRN

## 2022-10-21 MED ORDER — CIPROFLOXACIN-DEXAMETHASONE 0.3-0.1 % OT SUSP: 4.0000 [drp] | Freq: Two times a day (BID) | OTIC | 0 refills | Status: AC

## 2022-10-21 MED ORDER — PROMETHAZINE-DM 6.25-15 MG/5ML PO SYRP: 5.0000 mL | ORAL_SOLUTION | Freq: Three times a day (TID) | ORAL | 0 refills | Status: DC | PRN

## 2022-10-21 NOTE — Discharge Instructions (Addendum)
I am covering you with Ciprodex drops for your external ear infection.  We irrigated your ear in the clinic and got a little wax out.  Please use the drops for the next 7 days.  After your infection is cleared, you can use the Debrox drops to help soften earwax.  You can use the albuterol solution every 6 hours as needed for wheezing or shortness of breath.  You can use the cough syrup as night for your cough.  Please return to clinic or schedule a visit with your primary care if you have no improvement over the next 7 to 10 days, as this requires further evaluation.  Please seek immediate care for chest pain, increased shortness of breath, syncope, or high fever despite medication.

## 2022-10-21 NOTE — ED Provider Notes (Signed)
[facility]    CSN: [id number] Arrival date & time: 10/21/22  0802      History   Chief Complaint Chief Complaint  Patient presents with   Cough   Ear Pain    HPI Ruth [name] is a 47 y.o. female.   Patient presents to clinic for a nonproductive cough and increase albuterol use.  She denies fevers, shortness of breath or chest pain.  She did use a nebulizer treatment last night and needs a refill of this.  Her right ear has been painful and she has not been able to scratch into her ear like she normally does due to the pain.  Denies drainage from this ear.  She does have a slightly sore throat and a dry mouth as well.  She has been taking Zyrtec and Flonase.   The history is provided by the patient and medical records.  Cough Associated symptoms: ear pain and sore throat   Associated symptoms: no chest pain, no chills, no fever and no wheezing     Past Medical History:  Diagnosis Date   Abnormal uterine bleeding 2023   Anemia 2023   Anxiety    Arthritis    chronic back pain    Asthma    rarely uses inhaler   Bipolar 1 disorder    Follows w/ PCP Dr. Georganna Skeans.   Fibromyalgia    GERD (gastroesophageal reflux disease)    Hidradenitis    multiple boils, axilla and groin (As of 08/03/22, patient has a boil in her left axilla.)   HLD (hyperlipidemia)    diet controlled   Hx of migraines    occ   Hypertension    no meds   Hypothyroidism    Intentional drug overdose 03/28/2021   Obesity    Sleep apnea    Patient states that she had a sleep study in the past and was told she did not need a CPAP.   Thrombocytosis 01/26/2020   Type 2 diabetes mellitus    Type 2, Follows w/ PCP Dr. Georganna Skeans.   Wears glasses     Patient Active Problem List   Diagnosis Date Noted   Stenosis of cervical spine with myelopathy 01/16/2021   BMI 45.0-49.9, adult 09/04/2020   Right arm pain 02/14/2020   Hyperlipidemia associated with type 2 diabetes  mellitus 09/19/2019   Abnormal uterine bleeding (AUB) 12/07/2017   Abnormal mammogram of both breasts 11/02/2016   Hypothyroidism 06/21/2009   Type 2 diabetes mellitus with peripheral neuropathy 06/21/2009   BIPOLAR AFFECTIVE DISORDER 06/21/2009   PERSISTENT DISORDER INITIATING/MAINTAINING SLEEP 06/21/2009   INADEQUATE SLEEP HYGIENE 06/21/2009   OBSTRUCTIVE SLEEP APNEA 06/21/2009   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   CARPAL TUNNEL SYNDROME 06/20/2009   MERALGIA PARESTHETICA 06/20/2009   Essential hypertension 06/20/2009   FIBROMYALGIA 06/20/2009   CEPHALGIA 06/20/2009    Past Surgical History:  Procedure Laterality Date   ANTERIOR CERVICAL DECOMP/DISCECTOMY FUSION N/A 01/16/2021   Procedure: Cervical five-six Anterior cervical decompression/discectomy/fusion;  Surgeon: Lisbeth Renshaw, MD;  [facility];  Service: Neurosurgery;  Laterality: N/A;   CARPAL TUNNEL RELEASE Right    around 2009   CESAREAN SECTION     x2, 2003 & 2008   CYSTOSCOPY N/A 08/12/2022   Procedure: CYSTOSCOPY;  Surgeon: Lorriane Shire, MD;  Location: [facility];  Service: Gynecology;  Laterality: N/A;   DILITATION & CURRETTAGE/HYSTROSCOPY WITH NOVASURE ABLATION N/A 12/07/2017   Procedure: DILATATION & CURETTAGE/HYSTEROSCOPY WITH ATTEMPED NOVASURE ABLATION, FAILED/ENDOMETERIAL ABLATION  WITH HTA SYSTEM;  Surgeon: Willodean Rosenthal, MD;  Location[facility];  Service: Gynecology;  Laterality: N/A;   ELBOW SURGERY     "surgery for nerve pain" around 2009   ENDOMETRIAL BIOPSY  07/23/2022   negative for hyperplasia or malignancy   GANGLION CYST EXCISION Right    right arm    RECTOVAGINAL FISTULA CLOSURE     in 1324,4010, 2006   ROBOTIC ASSISTED TOTAL HYSTERECTOMY Bilateral 08/12/2022   Procedure: XI ROBOTIC ASSISTED TOTAL HYSTERECTOMY WITH SALPINGECTOMY;  Surgeon: Lorriane Shire, MD;  Location: [facility];  Service: Gynecology;  Laterality: Bilateral;   TONSILLECTOMY      childhood   TUBAL LIGATION  2008   XI ROBOT ASSISTED RECTOPEXY N/A 08/12/2022   Procedure: XI robot repair serosal repair of rectum;  Surgeon: Karie Soda, MD;  Location: [facility];  Service: General;  Laterality: N/A;    OB History     Gravida  7   Para  5   Term  4   Preterm  1   AB  2   Living  5      SAB  2   IAB      Ectopic      Multiple      Live Births  5        Obstetric Comments  Vag x 2>c/s emergency>vbac>c/s and BTL 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 Authorizing Provider  albuterol (PROVENTIL) (2.5 MG/3ML) 0.083% nebulizer solution Take 3 mLs (2.5 mg total) by nebulization every 6 (six) hours as needed for wheezing or shortness of breath. 10/21/22  Yes Rinaldo Ratel, [place] N, FNP  carbamide peroxide (DEBROX) 6.5 % OTIC solution Place 5 drops into the right ear 2 (two) times daily. 10/21/22  Yes Rinaldo Ratel, [place] N, FNP  ciprofloxacin-dexamethasone (CIPRODEX) OTIC suspension Place 4 drops into the right ear 2 (two) times daily for 7 days. 10/21/22 10/28/22 Yes Rinaldo Ratel, [place] N, FNP  promethazine-dextromethorphan (PROMETHAZINE-DM) 6.25-15 MG/5ML syrup Take 5 mLs by mouth 3 (three) times daily as needed. 10/21/22  Yes Rinaldo Ratel, [place] N, FNP  albuterol (VENTOLIN HFA) 108 (90 Base) MCG/ACT inhaler Inhale into the lungs. 05/03/16   [provider]  cetirizine (ZYRTEC ALLERGY) 10 MG tablet Take 1 tablet (10 mg total) by mouth daily. 10/04/22 01/02/23  Lang Zingg, [place] N, FNP  Continuous Blood Gluc Receiver (DEXCOM G7 RECEIVER) DEVI Use to check blood sugar three times a day . 04/08/22   Hoy Register, MD  doxycycline (VIBRAMYCIN) 100 MG capsule Take 1 capsule (100 mg total) by mouth 2 (two) times daily. 10/04/22   Jules Vidovich, [place] N, FNP  fluticasone [facility]) 50 MCG/ACT nasal spray Place 1 spray into both nostrils  daily. 10/04/22   Lynnleigh Soden, [place] N, FNP  fluticasone (FLOVENT HFA) 110 MCG/ACT inhaler Inhale into the lungs. 11/11/16   [provider]  hydrOXYzine (ATARAX) 25 MG tablet Take 1 tablet (25 mg total) by mouth 3 (three) times daily as needed. 07/13/22   Georganna Skeans, MD  medroxyPROGESTERone (PROVERA) 10 MG tablet Take by mouth. 08/07/16   [provider]  metFORMIN (GLUCOPHAGE) 500 MG tablet Take by mouth. 11/11/16   [provider]  montelukast (SINGULAIR) 10 MG tablet Take by mouth. 11/11/16   [provider]  OLANZapine (ZYPREXA) 5 MG tablet Take 1 tablet (5  mg total) by mouth at bedtime. 08/10/22   Georganna Skeans, MD  Semaglutide,0.25 or 0.5MG /DOS, (OZEMPIC, 0.25 OR 0.5 MG/DOSE,) 2 MG/3ML SOPN Inject 0.5 mg into the skin once a week. 10/20/22   Hoy Register, MD  traMADol (ULTRAM) 50 MG tablet Take by mouth. 11/11/16   [provider]  valACYclovir (VALTREX) 1000 MG tablet Take 1,000 mg by mouth 2 (two) times daily. 09/24/22   [provider]    Family History Family History  Problem Relation Age of Onset   Thyroid disease Mother    Diabetes Daughter    Diabetes Son    Breast cancer Neg Hx     Social History Social History   Tobacco Use   Smoking status: Every Day    Packs/day: .25    Types: Cigarettes   Smokeless tobacco: Never   Tobacco comments:    Per patient, she only smokes 2 to 3 cigarettes daily as of 08/03/22.  Vaping Use   Vaping Use: Never used  Substance Use Topics   Alcohol use: Not Currently   Drug use: Yes    Types: Marijuana    Comment: Occasional use only     Allergies   Metformin and related, Penicillins, and Victoza [liraglutide]   Review of Systems Review of Systems  Constitutional:  Negative for chills, fatigue and fever.  HENT:  Positive for congestion, ear pain and sore throat. Negative for trouble swallowing and voice change.   Respiratory:  Positive for cough. Negative for wheezing.    Cardiovascular:  Negative for chest pain.  Gastrointestinal:  Negative for abdominal pain.     Physical Exam Triage Vital Signs ED Triage Vitals  Enc Vitals Group     BP 10/21/22 0834 126/86     Pulse Rate 10/21/22 0834 83     Resp 10/21/22 0834 18     Temp 10/21/22 0834 97.8 F (36.6 C)     Temp Source 10/21/22 0834 Oral     SpO2 10/21/22 0834 98 %     Weight --      Height --      Head Circumference --      Peak Flow --      Pain Score 10/21/22 0833 6     Pain Loc --      Pain Edu? --      Excl. in GC? --    No data found.  Updated Vital Signs BP 126/86 (BP Location: Left Arm)   Pulse 83   Temp 97.8 F (36.6 C) (Oral)   Resp 18   LMP 07/05/2022   SpO2 98%   Visual Acuity Right Eye Distance:   Left Eye Distance:   Bilateral Distance:    Right Eye Near:   Left Eye Near:    Bilateral Near:     Physical Exam Vitals and nursing note reviewed.  Constitutional:      Appearance: Normal appearance.  HENT:     Head: Normocephalic and atraumatic.     Nose: Congestion and rhinorrhea present.     Mouth/Throat:     Mouth: Mucous membranes are moist.     Pharynx: Posterior oropharyngeal erythema present.  Eyes:     General: No scleral icterus.       Right eye: No discharge.        Left eye: No discharge.     Conjunctiva/sclera: Conjunctivae normal.  Cardiovascular:     Rate and Rhythm: Normal rate and regular rhythm.     Heart sounds:  Normal heart sounds. No murmur heard. Pulmonary:     Effort: Pulmonary effort is normal. No respiratory distress.     Breath sounds: Normal breath sounds.  Musculoskeletal:        General: No swelling. Normal range of motion.     Cervical back: Normal range of motion.  Skin:    General: Skin is warm and dry.     Capillary Refill: Capillary refill takes less than 2 seconds.  Neurological:     Mental Status: She is alert and oriented to person, place, and time.  Psychiatric:        Mood and Affect: Mood normal.         Behavior: Behavior normal.      UC Treatments / Results  Labs (all labs ordered are listed, but only abnormal results are displayed) Labs Reviewed - No data to display  EKG   Radiology No results found.  Procedures Procedures (including critical care time)  Medications Ordered in UC Medications - No data to display  Initial Impression / Assessment and Plan / UC Course  I have reviewed the triage vital signs and the nursing notes.  Pertinent labs & imaging results that were available during my care of the patient were reviewed by me and considered in my medical decision making (see chart for details).  Vitals and triage reviewed, patient is hemodynamically stable.  Right external auditory canal with tenderness, pain with pinna movement and auricle movement.  Will cover with Cortisporin drops.  There is significant cerumen in the right canal, patient requested irrigation in clinic.  Tympanic membrane intact without bulging, erythema or swelling.  Patient reports a nonproductive cough and increase in albuterol use.  Lungs are vesicular posteriorly, oxygenation 98% room air, able to speak in full sentences. Will refill albuterol nebulizer treatment and cover with cough medication.  Discussed that it is unlikely a bacterial upper respiratory infection at this point.  Return and follow-up precautions reviewed, patient verbalized understanding, no questions at this time.    Final Clinical Impressions(s) / UC Diagnoses   Final diagnoses:  Infective otitis externa of right ear  Mild asthma with acute exacerbation, unspecified whether persistent     Discharge Instructions      I am covering you with Ciprodex drops for your external ear infection.  We irrigated your ear in the clinic and got a little wax out.  Please use the drops for the next 7 days.  After your infection is cleared, you can use the Debrox drops to help soften earwax.  You can use the albuterol solution every 6  hours as needed for wheezing or shortness of breath.  You can use the cough syrup as night for your cough.  Please return to clinic or schedule a visit with your primary care if you have no improvement over the next 7 to 10 days, as this requires further evaluation.  Please seek immediate care for chest pain, increased shortness of breath, syncope, or high fever despite medication.      ED Prescriptions     Medication Sig Dispense Auth. Provider   albuterol (PROVENTIL) (2.5 MG/3ML) 0.083% nebulizer solution Take 3 mLs (2.5 mg total) by nebulization every 6 (six) hours as needed for wheezing or shortness of breath. 75 mL Rinaldo Ratel, [place] N, FNP   carbamide peroxide (DEBROX) 6.5 % OTIC solution Place 5 drops into the right ear 2 (two) times daily. 15 mL Fantasia Jinkins, [place] N, FNP   promethazine-dextromethorphan (PROMETHAZINE-DM) 6.25-15 MG/5ML syrup Take  5 mLs by mouth 3 (three) times daily as needed. 118 mL Rinaldo Ratel, [place] N, [place]   ciprofloxacin-dexamethasone (CIPRODEX) OTIC suspension Place 4 drops into the right ear 2 (two) times daily for 7 days. 2.8 mL Mate Alegria, [place] N, FNP      PDMP not reviewed this encounter.   Rinaldo Ratel [place] N, [place] 10/21/22 [phone]

## 2022-10-21 NOTE — ED Triage Notes (Signed)
Pt c/o cough for a couple days. Right ear pain after coughing and blowing nose yesterday causing ear to pop.

## 2022-10-26 ENCOUNTER — Other Ambulatory Visit: Payer: Self-pay

## 2022-10-26 ENCOUNTER — Encounter: Payer: Self-pay | Admitting: Family Medicine

## 2022-10-27 ENCOUNTER — Other Ambulatory Visit: Payer: Self-pay | Admitting: Family Medicine

## 2022-10-27 ENCOUNTER — Other Ambulatory Visit: Payer: Self-pay

## 2022-10-27 MED ORDER — DEXCOM G7 RECEIVER DEVI: 0 refills | Status: DC

## 2022-10-27 NOTE — Telephone Encounter (Signed)
Pt reports that the wrong Rx refill was sent. Pt stated she needs the sensor not the receiver  Medication Refill - Medication: DEXCOM G7 Sensor   Has the patient contacted their pharmacy? Yes.    Preferred Pharmacy (with phone number or street name):  [facility] [phone] [place], [place] - [address] Phone: [phone]  Fax: [phone]      Has the patient been seen for an appointment in the last year OR does the patient have an upcoming appointment? Yes.    Agent: Please be advised that RX refills may take up to 3 business days. We ask that you follow-up with your pharmacy.

## 2022-10-27 NOTE — Telephone Encounter (Signed)
Sent 10/27/2022

## 2022-10-27 NOTE — Telephone Encounter (Signed)
Requested Prescriptions  Refused Prescriptions Disp Refills   Continuous Glucose Receiver (DEXCOM G7 RECEIVER) DEVI 1 each 0    Sig: Use to check blood sugar three times a day .     Endocrinology: Diabetes - Testing Supplies Passed - 10/27/2022  5:50 PM      Passed - Valid encounter within last 12 months    Recent Outpatient Visits           2 months ago Bipolar disorder, current episode mixed, mild (HCC)   [place] [facility], Lauris Poag, MD   3 months ago Type 2 diabetes mellitus with other specified complication, without long-term current use of insulin (HCC)   [place] [facility], Lauris Poag, MD   4 months ago Type 2 diabetes mellitus with peripheral neuropathy [facility])   [place] [facility], Jeannett Senior L, RPH-CPP   6 months ago Type 2 diabetes mellitus with peripheral neuropathy [facility])   [place] [facility] [place], Cornelius Moras, RPH-CPP   6 months ago Dysmenorrhea   [place] [facility], Salomon Fick, NP       Future Appointments             In 2 weeks Georganna Skeans, MD [facility]

## 2022-10-28 ENCOUNTER — Telehealth: Payer: Self-pay | Admitting: *Deleted

## 2022-10-28 NOTE — Telephone Encounter (Signed)
  Chief Complaint: Dexcom 7 Sensor needed Symptoms: NA Frequency: NA Pertinent Negatives: Patient denies NA Disposition: ED /[] Urgent Care (no appt availability in office) / Appointment(In office/virtual)/  [place] [facility]/ Home Care/ Refused Recommended Disposition /[] [place] [facility]/  Follow-up with PCP Additional Notes:   Pt states is in need of Dexcom 7 sensor. States receiver was called in yesterday, does not need. States [facility] would need approval from insurance, however [facility] has approval.  Pt reiterates she uses Dexcom 7.  After hours call, please review.

## 2022-10-29 ENCOUNTER — Other Ambulatory Visit: Payer: Self-pay

## 2022-10-29 NOTE — Telephone Encounter (Signed)
Sent to pharmacy to help with PA

## 2022-10-30 ENCOUNTER — Telehealth: Payer: Self-pay | Admitting: Family Medicine

## 2022-10-30 ENCOUNTER — Other Ambulatory Visit: Payer: Self-pay

## 2022-10-30 ENCOUNTER — Other Ambulatory Visit: Payer: Self-pay | Admitting: *Deleted

## 2022-10-30 DIAGNOSIS — E1169 Type 2 diabetes mellitus with other specified complication: Secondary | ICD-10-CM

## 2022-10-30 MED ORDER — DEXCOM G7 SENSOR MISC
2 refills | Status: DC
  Filled 2022-10-30: qty 3, 30d supply, fill #0
  Filled 2022-12-03: qty 3, 30d supply, fill #1
  Filled 2022-12-28: qty 3, 30d supply, fill #2

## 2022-10-30 NOTE — Telephone Encounter (Signed)
Medication Refill - Medication: Dexcom G7 Sensor    (patient has receiver but no sensors)   Has the patient contacted their pharmacy? Yes.   ( Preferred Pharmacy (with phone number or street name):  [facility]  [address] [place] [address]  Phone: [phone] Fax: [phone]  Hours: M-F 7:30a-6:00p     Has the patient been seen for an appointment in the last year OR does the patient have an upcoming appointment? Yes.    Agent: Please be advised that RX refills may take up to 3 business days. We ask that you follow-up with your pharmacy.

## 2022-10-30 NOTE — Telephone Encounter (Signed)
Sensor has been sent in  Patient aware

## 2022-11-11 ENCOUNTER — Encounter: Payer: Self-pay | Admitting: Family Medicine

## 2022-11-11 ENCOUNTER — Ambulatory Visit (INDEPENDENT_AMBULATORY_CARE_PROVIDER_SITE_OTHER): Payer: Medicaid Other | Admitting: Family Medicine

## 2022-11-11 VITALS — BP 127/79 | HR 88 | Temp 98.1°F | Resp 16 | Wt 245.0 lb

## 2022-11-11 DIAGNOSIS — Z7984 Long term (current) use of oral hypoglycemic drugs: Secondary | ICD-10-CM

## 2022-11-11 DIAGNOSIS — E1169 Type 2 diabetes mellitus with other specified complication: Secondary | ICD-10-CM

## 2022-11-11 DIAGNOSIS — F3161 Bipolar disorder, current episode mixed, mild: Secondary | ICD-10-CM | POA: Diagnosis not present

## 2022-11-11 DIAGNOSIS — L659 Nonscarring hair loss, unspecified: Secondary | ICD-10-CM | POA: Diagnosis not present

## 2022-11-11 DIAGNOSIS — I10 Essential (primary) hypertension: Secondary | ICD-10-CM | POA: Diagnosis not present

## 2022-11-11 DIAGNOSIS — F17200 Nicotine dependence, unspecified, uncomplicated: Secondary | ICD-10-CM

## 2022-11-11 DIAGNOSIS — F17210 Nicotine dependence, cigarettes, uncomplicated: Secondary | ICD-10-CM

## 2022-11-11 LAB — POCT GLYCOSYLATED HEMOGLOBIN (HGB A1C): Hemoglobin A1C: 6.7 % — AB (ref 4.0–5.6)

## 2022-11-11 NOTE — Progress Notes (Signed)
Patient is here for their 3 month follow-up Patient c/o hair  loss. Patient said that hair has been falling out and she is concern as to what may be going on Care gaps have been discussed with patient

## 2022-11-12 LAB — TSH: TSH: 1.2 u[IU]/mL (ref 0.450–4.500)

## 2022-11-17 ENCOUNTER — Encounter: Payer: Self-pay | Admitting: Family Medicine

## 2022-11-17 NOTE — Progress Notes (Signed)
Established Patient Office Visit  Subjective    Patient ID: Ruth [name], female    DOB: 12-11-1975  Age: 47 y.o. MRN: [id number]  CC:  Chief Complaint  Patient presents with   Follow-up    HPI Ruth [name] presents for routine follow up of chronic med issues. She also reports that she has been losing hair and wonders if it is her meds.    Outpatient Encounter Medications as of 11/11/2022  Medication Sig   albuterol (PROVENTIL) (2.5 MG/3ML) 0.083% nebulizer solution Take 3 mLs (2.5 mg total) by nebulization every 6 (six) hours as needed for wheezing or shortness of breath.   albuterol (VENTOLIN HFA) 108 (90 Base) MCG/ACT inhaler Inhale into the lungs.   carbamide peroxide (DEBROX) 6.5 % OTIC solution Place 5 drops into the right ear 2 (two) times daily.   cetirizine (ZYRTEC ALLERGY) 10 MG tablet Take 1 tablet (10 mg total) by mouth daily.   Continuous Glucose Receiver (DEXCOM G7 RECEIVER) DEVI Use to check blood sugar three times a day .   Continuous Glucose Sensor (DEXCOM G7 SENSOR) MISC Use to check blood sugar three time daily. Change sensors once every 10 days.   doxycycline (VIBRAMYCIN) 100 MG capsule Take 1 capsule (100 mg total) by mouth 2 (two) times daily.   fluticasone (FLONASE) 50 MCG/ACT nasal spray Place 1 spray into both nostrils daily.   fluticasone (FLOVENT HFA) 110 MCG/ACT inhaler Inhale into the lungs.   hydrOXYzine (ATARAX) 25 MG tablet Take 1 tablet (25 mg total) by mouth 3 (three) times daily as needed.   medroxyPROGESTERone (PROVERA) 10 MG tablet Take by mouth.   metFORMIN (GLUCOPHAGE) 500 MG tablet Take by mouth.   montelukast (SINGULAIR) 10 MG tablet Take by mouth.   OLANZapine (ZYPREXA) 5 MG tablet Take 1 tablet (5 mg total) by mouth at bedtime.   promethazine-dextromethorphan (PROMETHAZINE-DM) 6.25-15 MG/5ML syrup Take 5 mLs by mouth 3 (three) times daily as needed.   Semaglutide,0.25 or 0.5MG /DOS, (OZEMPIC, 0.25 OR 0.5 MG/DOSE,) 2  MG/3ML SOPN Inject 0.5 mg into the skin once a week.   traMADol (ULTRAM) 50 MG tablet Take by mouth.   [DISCONTINUED] valACYclovir (VALTREX) 1000 MG tablet Take 1,000 mg by mouth 2 (two) times daily.   No facility-administered encounter medications on file as of 11/11/2022.    Past Medical History:  Diagnosis Date   Abnormal uterine bleeding 2023   Anemia 2023   Anxiety    Arthritis    chronic back pain    Asthma    rarely uses inhaler   Bipolar 1 disorder (HCC)    Follows w/ PCP Dr. Georganna Skeans.   Fibromyalgia    GERD (gastroesophageal reflux disease)    Hidradenitis    multiple boils, axilla and groin (As of 08/03/22, patient has a boil in her left axilla.)   HLD (hyperlipidemia)    diet controlled   Hx of migraines    occ   Hypertension    no meds   Hypothyroidism    Intentional drug overdose (HCC) 03/28/2021   Obesity    Sleep apnea    Patient states that she had a sleep study in the past and was told she did not need a CPAP.   Thrombocytosis 01/26/2020   Type 2 diabetes mellitus (HCC)    Type 2, Follows w/ PCP Dr. Georganna Skeans.   Wears glasses     Past Surgical History:  Procedure Laterality Date   ANTERIOR CERVICAL DECOMP/DISCECTOMY FUSION N/A  01/16/2021   Procedure: Cervical five-six Anterior cervical decompression/discectomy/fusion;  Surgeon: Lisbeth Renshaw, MD;  [facility];  Service: Neurosurgery;  Laterality: N/A;   CARPAL TUNNEL RELEASE Right    around 2009   CESAREAN SECTION     x2, 2003 & 2008   CYSTOSCOPY N/A 08/12/2022   Procedure: CYSTOSCOPY;  Surgeon: Lorriane Shire, MD;  Location: [facility];  Service: Gynecology;  Laterality: N/A;   DILITATION & CURRETTAGE/HYSTROSCOPY WITH NOVASURE ABLATION N/A 12/07/2017   Procedure: DILATATION & CURETTAGE/HYSTEROSCOPY WITH ATTEMPED NOVASURE ABLATION, FAILED/ENDOMETERIAL ABLATION WITH HTA SYSTEM;  Surgeon: Willodean Rosenthal, MD;  [facility];  Service: Gynecology;  Laterality:  N/A;   ELBOW SURGERY     "surgery for nerve pain" around 2009   ENDOMETRIAL BIOPSY  07/23/2022   negative for hyperplasia or malignancy   GANGLION CYST EXCISION Right    right arm    RECTOVAGINAL FISTULA CLOSURE     in 1610,9604, 2006   ROBOTIC ASSISTED TOTAL HYSTERECTOMY Bilateral 08/12/2022   Procedure: XI ROBOTIC ASSISTED TOTAL HYSTERECTOMY WITH SALPINGECTOMY;  Surgeon: Lorriane Shire, MD;  Location: [facility];  Service: Gynecology;  Laterality: Bilateral;   TONSILLECTOMY     childhood   TUBAL LIGATION  2008   XI ROBOT ASSISTED RECTOPEXY N/A 08/12/2022   Procedure: XI robot repair serosal repair of rectum;  Surgeon: Karie Soda, MD;  Location: [facility];  Service: General;  Laterality: N/A;    Family History  Problem Relation Age of Onset   Thyroid disease Mother    Diabetes Daughter    Diabetes Son    Breast cancer Neg Hx     Social History   Socioeconomic History   Marital status: Married    Spouse name: Not on file   Number of children: Not on file   Years of education: Not on file   Highest education level: Not on file  Occupational History   Not on file  Tobacco Use   Smoking status: Every Day    Packs/day: .25    Types: Cigarettes   Smokeless tobacco: Never   Tobacco comments:    Per patient, she only smokes 2 to 3 cigarettes daily as of 08/03/22.  Vaping Use   Vaping Use: Never used  Substance and Sexual Activity   Alcohol use: Not Currently   Drug use: Yes    Types: Marijuana    Comment: Occasional use only   Sexual activity: Not Currently    Birth control/protection: Surgical    Comment: tubal ligation  Other Topics Concern   Not on file  Social History Narrative   Not on file   Social Determinants of Health   Financial Resource Strain: Not on file  Food Insecurity: Not on file  Transportation Needs: Not on file  Physical Activity: Not on file  Stress: Not on file  Social Connections: Not on file   Intimate Partner Violence: Not on file    Review of Systems  All other systems reviewed and are negative.       Objective    BP 127/79   Pulse 88   Temp 98.1 F (36.7 C) (Oral)   Resp 16   Wt 245 lb (111.1 kg)   LMP 07/05/2022   SpO2 95%   BMI 39.54 kg/m   Physical Exam Vitals and nursing note reviewed.  Constitutional:      General: She is not in acute distress. Cardiovascular:     Rate and Rhythm: Normal rate  and regular rhythm.  Pulmonary:     Effort: Pulmonary effort is normal.     Breath sounds: Normal breath sounds.  Abdominal:     Palpations: Abdomen is soft.     Tenderness: There is no abdominal tenderness.  Neurological:     General: No focal deficit present.     Mental Status: She is alert and oriented to person, place, and time.  Psychiatric:        Mood and Affect: Affect normal. Mood is anxious.         Assessment & Plan:   1. Type 2 diabetes mellitus with other specified complication, without long-term current use of insulin (HCC) A1c is improved and at goal. Continue  - POCT glycosylated hemoglobin (Hb A1C)  2. Essential hypertension Appears stable. continue  3. Hair loss TSH ordered. Patient to follow up with [facility] concerning meds that may cause sx.  - TSH  4. Bipolar disorder, current episode mixed, mild (HCC) Management as per consultant.   5. Smoking Discussed reduction/cessation    Return in about 6 months (around 05/14/2023) for follow up.   Tommie Raymond, MD

## 2022-11-19 ENCOUNTER — Other Ambulatory Visit: Payer: Self-pay

## 2022-11-25 ENCOUNTER — Other Ambulatory Visit: Payer: Self-pay

## 2022-11-27 ENCOUNTER — Ambulatory Visit: Payer: Self-pay

## 2022-11-27 ENCOUNTER — Telehealth: Payer: Medicaid Other | Admitting: Physician Assistant

## 2022-11-27 DIAGNOSIS — L304 Erythema intertrigo: Secondary | ICD-10-CM

## 2022-11-27 MED ORDER — FLUCONAZOLE 150 MG PO TABS: 150.0000 mg | ORAL_TABLET | ORAL | 0 refills | Status: DC

## 2022-11-27 MED ORDER — NYSTATIN 100000 UNIT/GM EX CREA: 1.0000 | TOPICAL_CREAM | Freq: Two times a day (BID) | CUTANEOUS | 0 refills | Status: DC

## 2022-11-27 NOTE — Progress Notes (Signed)
Virtual Visit Consent   Ruth [name], you are scheduled for a virtual visit with a [facility] provider today. Just as with appointments in the office, your consent must be obtained to participate. Your consent will be active for this visit and any virtual visit you may have with one of our providers in the next 365 days. If you have a MyChart account, a copy of this consent can be sent to you electronically.  As this is a virtual visit, video technology does not allow for your provider to perform a traditional examination. This may limit your provider's ability to fully assess your condition. If your provider identifies any concerns that need to be evaluated in person or the need to arrange testing (such as labs, EKG, etc.), we will make arrangements to do so. Although advances in technology are sophisticated, we cannot ensure that it will always work on either your end or our end. If the connection with a video visit is poor, the visit may have to be switched to a telephone visit. With either a video or telephone visit, we are not always able to ensure that we have a secure connection.  By engaging in this virtual visit, you consent to the provision of healthcare and authorize for your insurance to be billed (if applicable) for the services provided during this visit. Depending on your insurance coverage, you may receive a charge related to this service.  I need to obtain your verbal consent now. Are you willing to proceed with your visit today? Ruth [name] has provided verbal consent on 11/27/2022 for a virtual visit (video or telephone). [name], PA-C  Date: 11/27/2022 5:28 PM  Virtual Visit via Video Note   I, [name], connected with  Ruth [name]  ([id number], [birth year]) on 11/27/22 at  5:15 PM EDT by a video-enabled telemedicine application and verified that I am speaking with the correct person using two  identifiers.  Location: Patient: Virtual Visit Location Patient: Mobile Provider: Virtual Visit Location Provider: Home Office   I discussed the limitations of evaluation and management by telemedicine and the availability of in person appointments. The patient expressed understanding and agreed to proceed.    History of Present Illness: Ruth [name] is a 47 y.o. who identifies as a female who was assigned female at birth, and is being seen today for rash under breast.  HPI: Rash This is a new problem. The current episode started in the past 7 days. The problem has been gradually worsening since onset. The affected locations include the chest. The rash is characterized by redness, itchiness and burning. Past treatments include nothing. The treatment provided no relief.     Problems:  Patient Active Problem List   Diagnosis Date Noted   Stenosis of cervical spine with myelopathy (HCC) 01/16/2021   BMI 45.0-49.9, adult (HCC) 09/04/2020   Right arm pain 02/14/2020   Hyperlipidemia associated with type 2 diabetes mellitus (HCC) 09/19/2019   Abnormal uterine bleeding (AUB) 12/07/2017   Abnormal mammogram of both breasts 11/02/2016   Hypothyroidism 06/21/2009   Type 2 diabetes mellitus with peripheral neuropathy (HCC) 06/21/2009   BIPOLAR AFFECTIVE DISORDER 06/21/2009   PERSISTENT DISORDER INITIATING/MAINTAINING SLEEP 06/21/2009   INADEQUATE SLEEP HYGIENE 06/21/2009   OBSTRUCTIVE SLEEP APNEA 06/21/2009   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   CARPAL TUNNEL SYNDROME 06/20/2009   MERALGIA PARESTHETICA 06/20/2009   Essential hypertension 06/20/2009   FIBROMYALGIA 06/20/2009   CEPHALGIA 06/20/2009    Allergies:  Allergies  Allergen Reactions   Metformin And Related Diarrhea   Penicillins Other (See Comments)    Unknown; from childhood Has patient had a PCN reaction causing immediate rash, facial/tongue/throat swelling, SOB or lightheadedness with hypotension: Unknow Has patient  had a PCN reaction causing severe rash involving mucus membranes or skin necrosis: Unknown Has patient had a PCN reaction that required hospitalization: Unknown Has patient had a PCN reaction occurring within the last 10 years: Unknown If all of the above answers are "NO", then may proceed with Cephalosporin use.    Victoza [Liraglutide] Itching   Medications:  Current Outpatient Medications:    fluconazole (DIFLUCAN) 150 MG tablet, Take 1 tablet (150 mg total) by mouth once a week., Disp: 4 tablet, Rfl: 0   nystatin cream (MYCOSTATIN), Apply 1 Application topically 2 (two) times daily., Disp: 60 g, Rfl: 0   albuterol (PROVENTIL) (2.5 MG/3ML) 0.083% nebulizer solution, Take 3 mLs (2.5 mg total) by nebulization every 6 (six) hours as needed for wheezing or shortness of breath., Disp: 75 mL, Rfl: 12   albuterol (VENTOLIN HFA) 108 (90 Base) MCG/ACT inhaler, Inhale into the lungs., Disp: , Rfl:    carbamide peroxide (DEBROX) 6.5 % OTIC solution, Place 5 drops into the right ear 2 (two) times daily., Disp: 15 mL, Rfl: 0   cetirizine (ZYRTEC ALLERGY) 10 MG tablet, Take 1 tablet (10 mg total) by mouth daily., Disp: 30 tablet, Rfl: 2   Continuous Glucose Receiver (DEXCOM G7 RECEIVER) DEVI, Use to check blood sugar three times a day ., Disp: 1 each, Rfl: 0   Continuous Glucose Sensor (DEXCOM G7 SENSOR) MISC, Use to check blood sugar three time daily. Change sensors once every 10 days., Disp: 3 each, Rfl: 2   doxycycline (VIBRAMYCIN) 100 MG capsule, Take 1 capsule (100 mg total) by mouth 2 (two) times daily., Disp: 20 capsule, Rfl: 0   fluticasone (FLONASE) 50 MCG/ACT nasal spray, Place 1 spray into both nostrils daily., Disp: 9.9 mL, Rfl: 2   fluticasone (FLOVENT HFA) 110 MCG/ACT inhaler, Inhale into the lungs., Disp: , Rfl:    hydrOXYzine (ATARAX) 25 MG tablet, Take 1 tablet (25 mg total) by mouth 3 (three) times daily as needed., Disp: 30 tablet, Rfl: 0   medroxyPROGESTERone (PROVERA) 10 MG tablet,  Take by mouth., Disp: , Rfl:    metFORMIN (GLUCOPHAGE) 500 MG tablet, Take by mouth., Disp: , Rfl:    montelukast (SINGULAIR) 10 MG tablet, Take by mouth., Disp: , Rfl:    OLANZapine (ZYPREXA) 5 MG tablet, Take 1 tablet (5 mg total) by mouth at bedtime., Disp: 90 tablet, Rfl: 0   promethazine-dextromethorphan (PROMETHAZINE-DM) 6.25-15 MG/5ML syrup, Take 5 mLs by mouth 3 (three) times daily as needed., Disp: 118 mL, Rfl: 0   Semaglutide,0.25 or 0.5MG /DOS, (OZEMPIC, 0.25 OR 0.5 MG/DOSE,) 2 MG/3ML SOPN, Inject 0.5 mg into the skin once a week., Disp: 3 mL, Rfl: 2   traMADol (ULTRAM) 50 MG tablet, Take by mouth., Disp: , Rfl:   Observations/Objective: Patient is well-developed, well-nourished in no acute distress.  Resting comfortably Head is normocephalic, atraumatic.  No labored breathing.  Speech is clear and coherent with logical content.  Patient is alert and oriented at baseline.    Assessment and Plan: 1. Intertrigo - fluconazole (DIFLUCAN) 150 MG tablet; Take 1 tablet (150 mg total) by mouth once a week.  Dispense: 4 tablet; Refill: 0 - nystatin cream (MYCOSTATIN); Apply 1 Application topically 2 (two) times daily.  Dispense: 60 g; Refill: 0  -  Suspect intertrigo - Fluconazole and Nystatin prescribed - Keep skin folds clean and dry - Interdry pads can be used under bra or in groin to wick away moisture - Seek in person evaluation if not improving or if continues to worsen  Follow Up Instructions: I discussed the assessment and treatment plan with the patient. The patient was provided an opportunity to ask questions and all were answered. The patient agreed with the plan and demonstrated an understanding of the instructions.  A copy of instructions were sent to the patient via MyChart unless otherwise noted below.     The patient was advised to call back or seek an in-person evaluation if the symptoms worsen or if the condition fails to improve as anticipated.  Time:  I spent 8  minutes with the patient via telehealth technology discussing the above problems/concerns.    [name], PA-C

## 2022-11-27 NOTE — Patient Instructions (Signed)
Ruth [name], thank you for joining Ruth Loveless, PA-C for today's virtual visit.  While this provider is not your primary care provider (PCP), if your PCP is located in our provider database this encounter information will be shared with them immediately following your visit.   A [facility] MyChart account gives you access to today's visit and all your visits, tests, and labs performed at [facility] " click here if you don't have a [facility] MyChart account or go to [email or web address]  Consent: (Patient) Ruth [name] provided verbal consent for this virtual visit at the beginning of the encounter.  Current Medications:  Current Outpatient Medications:    fluconazole (DIFLUCAN) 150 MG tablet, Take 1 tablet (150 mg total) by mouth once a week., Disp: 4 tablet, Rfl: 0   nystatin cream (MYCOSTATIN), Apply 1 Application topically 2 (two) times daily., Disp: 60 g, Rfl: 0   albuterol (PROVENTIL) (2.5 MG/3ML) 0.083% nebulizer solution, Take 3 mLs (2.5 mg total) by nebulization every 6 (six) hours as needed for wheezing or shortness of breath., Disp: 75 mL, Rfl: 12   albuterol (VENTOLIN HFA) 108 (90 Base) MCG/ACT inhaler, Inhale into the lungs., Disp: , Rfl:    carbamide peroxide (DEBROX) 6.5 % OTIC solution, Place 5 drops into the right ear 2 (two) times daily., Disp: 15 mL, Rfl: 0   cetirizine (ZYRTEC ALLERGY) 10 MG tablet, Take 1 tablet (10 mg total) by mouth daily., Disp: 30 tablet, Rfl: 2   Continuous Glucose Receiver (DEXCOM G7 RECEIVER) DEVI, Use to check blood sugar three times a day ., Disp: 1 each, Rfl: 0   Continuous Glucose Sensor (DEXCOM G7 SENSOR) MISC, Use to check blood sugar three time daily. Change sensors once every 10 days., Disp: 3 each, Rfl: 2   doxycycline (VIBRAMYCIN) 100 MG capsule, Take 1 capsule (100 mg total) by mouth 2 (two) times daily., Disp: 20 capsule, Rfl: 0   fluticasone (FLONASE) 50 MCG/ACT nasal spray, Place 1  spray into both nostrils daily., Disp: 9.9 mL, Rfl: 2   fluticasone (FLOVENT HFA) 110 MCG/ACT inhaler, Inhale into the lungs., Disp: , Rfl:    hydrOXYzine (ATARAX) 25 MG tablet, Take 1 tablet (25 mg total) by mouth 3 (three) times daily as needed., Disp: 30 tablet, Rfl: 0   medroxyPROGESTERone (PROVERA) 10 MG tablet, Take by mouth., Disp: , Rfl:    metFORMIN (GLUCOPHAGE) 500 MG tablet, Take by mouth., Disp: , Rfl:    montelukast (SINGULAIR) 10 MG tablet, Take by mouth., Disp: , Rfl:    OLANZapine (ZYPREXA) 5 MG tablet, Take 1 tablet (5 mg total) by mouth at bedtime., Disp: 90 tablet, Rfl: 0   promethazine-dextromethorphan (PROMETHAZINE-DM) 6.25-15 MG/5ML syrup, Take 5 mLs by mouth 3 (three) times daily as needed., Disp: 118 mL, Rfl: 0   Semaglutide,0.25 or 0.5MG /DOS, (OZEMPIC, 0.25 OR 0.5 MG/DOSE,) 2 MG/3ML SOPN, Inject 0.5 mg into the skin once a week., Disp: 3 mL, Rfl: 2   traMADol (ULTRAM) 50 MG tablet, Take by mouth., Disp: , Rfl:    Medications ordered in this encounter:  Meds ordered this encounter  Medications   fluconazole (DIFLUCAN) 150 MG tablet    Sig: Take 1 tablet (150 mg total) by mouth once a week.    Dispense:  4 tablet    Refill:  0    Order Specific Question:   Supervising Provider    Answer:   Merrilee Jansky [[id number]]   nystatin cream (MYCOSTATIN)    Sig: Apply  1 Application topically 2 (two) times daily.    Dispense:  60 g    Refill:  0    Order Specific Question:   Supervising Provider    Answer:   Merrilee Jansky [id number]     *If you need refills on other medications prior to your next appointment, please contact your pharmacy*  Follow-Up: Call back or seek an in-person evaluation if the symptoms worsen or if the condition fails to improve as anticipated.  [facility] [phone]  Other Instructions Intertrigo Ruth [name] is skin irritation (inflammation) that happens in warm, moist areas of the body. The irritation can cause a rash and  make skin raw and itchy. The rash is usually pink or red. It happens mostly between folds of skin or where skin rubs together, such as: In the armpits. Under the breasts. Under the belly. In the groin area. Around the butt area. Between the toes. This condition is not passed from person to person. What are the causes? Heat, moisture, rubbing, and not enough air movement. The condition can be made worse by: Sweat. Bacteria. A fungus, such as yeast. What increases the risk? Moisture in your skin folds. You are more likely to develop this condition if you: Are not able to move around. Live in a warm and moist climate. Are not able to control your pee (urine) or poop (stool). Wear splints, braces, or other medical devices. Are overweight. Have diabetes. What are the signs or symptoms? A pink or red skin rash in a skin fold or near a skin fold. Raw or scaly skin. Itching. A burning feeling. Bleeding. Leaking fluid. A bad smell. How is this treated? Cleaning and drying your skin. Taking an antibiotic medicine or using an antibiotic skin cream for a bacterial infection. Using an antifungal cream on your skin or taking pills for an infection that was caused by a fungus, such as yeast. Using a steroid ointment to stop the itching and irritation. Separating the skin fold with a clean cotton cloth to absorb moisture and allow air to flow into the area. Follow these instructions at home: Keep the affected area clean and dry. Do not scratch your skin. Stay cool as much as you can. Use an air conditioner or a fan, if you have one. Apply over-the-counter and prescription medicines only as told by your doctor. If you were prescribed antibiotics, use them as told by your doctor. Do not stop using the antibiotic even if you start to feel better. Keep all follow-up visits. Your doctor may need to check your skin to make sure that the treatment is working. How is this prevented? Shower and  dry yourself well after being active. Use a hair dryer on a cool setting to dry between skin folds. Do not wear tight clothes. Wear clothes that: Are loose. Take moisture away from your body. Are made of cotton. Wear a bra that gives good support, if needed. Protect the skin in your groin and butt area as told by your doctor. To do this: Follow a regular cleaning routine. Use creams, powders, or ointments that protect your skin. Change protection pads often. Stay at a healthy weight. Take care of your feet. This is very important if you have diabetes. You should: Wear shoes that fit well. Keep your feet dry. Wear clean cotton or wool socks. Keep your blood sugar under control if you have diabetes. Contact a doctor if: Your symptoms do not get better with treatment. Your symptoms  get worse or they spread. You notice more redness and warmth. You have a fever. This information is not intended to replace advice given to you by your health care provider. Make sure you discuss any questions you have with your health care provider. Document Revised: 11/13/2021 Document Reviewed: 11/13/2021 Elsevier Patient Education  2024 Elsevier Inc.    If you have been instructed to have an in-person evaluation today at a local Urgent Care facility, please use the link below. It will take you to a list of all of our available [facility], including address, phone number and hours of operation. Please do not delay care.  [facility]  If you or a family member do not have a primary care provider, use the link below to schedule a visit and establish care. When you choose a [facility] primary care physician or advanced practice provider, you gain a long-term partner in health. Find a Primary Care Provider  Learn more about [facility]'s in-office and virtual care options: [facility] Get Care Now

## 2022-11-27 NOTE — Telephone Encounter (Signed)
  Chief Complaint: Rash under breasts - yeast infection? Symptoms: Rash very itchy Frequency: past couple of days. Pertinent Negatives: Patient denies open wounds from scratching Disposition: [] ED /[] Urgent Care (no appt availability in office) / [] Appointment(In office/virtual)/ [x]  [facility]/ [] Home Care/ [] Refused Recommended Disposition /[] [facility]/ []  Follow-up with PCP Additional Notes: PT states that she has had a itchy rash for the past few days. She often gets these when the weather gets hot. It is usually a yeast infection. In the past she has been given an antifungal cream and diflucan whic has worked well.  No ov available. Made VV appt.    Summary: rash under breast   Patient stated she has a possible yeast infection under both her breast. She has a rash and it itches really bad. Patient request provider call in a medication for her.  ----- Message from Elon Jester sent at 11/27/2022  4:13 PM EDT ----- Patient stated she has a possible yeast infection under both her breast. She has a rash and it itches really bad        Reason for Disposition  Looks infected (spreading redness, red streak, pus)  Answer Assessment - Initial Assessment Questions 1. DESCRIPTION: "Describe the itching you are having." "Where is it located?"     Itchy red and irritated 2. SEVERITY: "How bad is it?"    - MILD: Doesn't interfere with normal activities.   - MODERATE-SEVERE: Interferes with work, school, sleep, or other activities.      moderate 3. SCRATCHING: "Are there any scratch marks? Bleeding?"     Not yet 4. ONSET: "When did the itching begin?"      A couple of days ago 5. CAUSE: "What do you think is causing the itching?"      Yeast infection  Protocols used: Itching - Localized-A-AH

## 2022-12-21 ENCOUNTER — Other Ambulatory Visit: Payer: Self-pay

## 2022-12-30 ENCOUNTER — Other Ambulatory Visit: Payer: Self-pay

## 2022-12-31 ENCOUNTER — Other Ambulatory Visit: Payer: Self-pay

## 2023-01-18 ENCOUNTER — Other Ambulatory Visit: Payer: Self-pay | Admitting: Family Medicine

## 2023-01-18 DIAGNOSIS — E1142 Type 2 diabetes mellitus with diabetic polyneuropathy: Secondary | ICD-10-CM

## 2023-01-19 MED ORDER — OZEMPIC (0.25 OR 0.5 MG/DOSE) 2 MG/3ML ~~LOC~~ SOPN
0.5000 mg | PEN_INJECTOR | SUBCUTANEOUS | 1 refills | Status: DC
  Filled 2023-01-19: qty 3, 28d supply, fill #0
  Filled 2023-02-12: qty 3, 28d supply, fill #1

## 2023-01-20 ENCOUNTER — Other Ambulatory Visit: Payer: Self-pay

## 2023-01-26 ENCOUNTER — Other Ambulatory Visit: Payer: Self-pay

## 2023-01-26 ENCOUNTER — Other Ambulatory Visit: Payer: Self-pay | Admitting: Family Medicine

## 2023-01-26 DIAGNOSIS — E1169 Type 2 diabetes mellitus with other specified complication: Secondary | ICD-10-CM

## 2023-01-26 MED ORDER — DEXCOM G7 SENSOR MISC
2 refills | Status: DC
  Filled 2023-01-26: qty 3, 30d supply, fill #0
  Filled 2023-03-10: qty 3, 30d supply, fill #1
  Filled 2023-04-05: qty 3, 30d supply, fill #2

## 2023-01-27 ENCOUNTER — Emergency Department (HOSPITAL_COMMUNITY): Payer: Medicaid Other

## 2023-01-27 ENCOUNTER — Emergency Department (HOSPITAL_COMMUNITY)
Admission: EM | Admit: 2023-01-27 | Discharge: 2023-01-27 | Disposition: A | Discharge status: Home / Self Care | Payer: Medicaid Other | Source: Home / Self Care | Attending: Emergency Medicine | Admitting: Emergency Medicine

## 2023-01-27 ENCOUNTER — Ambulatory Visit
Admission: RE | Admit: 2023-01-27 | Discharge: 2023-01-27 | Disposition: A | Discharge status: Home / Self Care | Payer: Medicaid Other | Source: Ambulatory Visit | Attending: Physician Assistant | Admitting: Physician Assistant

## 2023-01-27 ENCOUNTER — Other Ambulatory Visit: Payer: Self-pay

## 2023-01-27 ENCOUNTER — Encounter (HOSPITAL_COMMUNITY): Payer: Self-pay | Admitting: Emergency Medicine

## 2023-01-27 VITALS — BP 128/85 | HR 77 | Temp 98.2°F | Resp 20

## 2023-01-27 DIAGNOSIS — R0781 Pleurodynia: Secondary | ICD-10-CM | POA: Insufficient documentation

## 2023-01-27 DIAGNOSIS — R519 Headache, unspecified: Secondary | ICD-10-CM | POA: Insufficient documentation

## 2023-01-27 DIAGNOSIS — R079 Chest pain, unspecified: Secondary | ICD-10-CM | POA: Diagnosis not present

## 2023-01-27 DIAGNOSIS — J45909 Unspecified asthma, uncomplicated: Secondary | ICD-10-CM | POA: Insufficient documentation

## 2023-01-27 DIAGNOSIS — R059 Cough, unspecified: Secondary | ICD-10-CM | POA: Insufficient documentation

## 2023-01-27 DIAGNOSIS — N760 Acute vaginitis: Secondary | ICD-10-CM | POA: Diagnosis not present

## 2023-01-27 DIAGNOSIS — R0602 Shortness of breath: Secondary | ICD-10-CM | POA: Insufficient documentation

## 2023-01-27 LAB — TROPONIN I (HIGH SENSITIVITY): Troponin I (High Sensitivity): <2 ng/L (ref <18)

## 2023-01-27 LAB — COMPREHENSIVE METABOLIC PANEL
ALT: 11 U/L (ref 0–44)
AST: 13 U/L — ABNORMAL LOW (ref 15–41)
Albumin: 3.8 g/dL (ref 3.5–5.0)
Alkaline Phosphatase: 73 U/L (ref 38–126)
Anion gap: 9 (ref 5–15)
BUN: 10 mg/dL (ref 6–20)
CO2: 25 mmol/L (ref 22–32)
Calcium: 8.9 mg/dL (ref 8.9–10.3)
Chloride: 102 mmol/L (ref 98–111)
Creatinine, Ser: 0.65 mg/dL (ref 0.44–1.00)
GFR, Estimated: >60 mL/min (ref >60)
Glucose, Bld: 112 mg/dL — ABNORMAL HIGH (ref 70–99)
Potassium: 3.6 mmol/L (ref 3.5–5.1)
Sodium: 136 mmol/L (ref 135–145)
Total Bilirubin: 0.5 mg/dL (ref 0.3–1.2)
Total Protein: 7.8 g/dL (ref 6.5–8.1)

## 2023-01-27 LAB — CBC WITH DIFFERENTIAL/PLATELET
Abs Immature Granulocytes: 0.01 10*3/uL (ref 0.00–0.07)
Basophils Absolute: 0 10*3/uL (ref 0.0–0.1)
Basophils Relative: 0 %
Eosinophils Absolute: 0.1 10*3/uL (ref 0.0–0.5)
Eosinophils Relative: 3 %
HCT: 35.4 % — ABNORMAL LOW (ref 36.0–46.0)
Hemoglobin: 11.1 g/dL — ABNORMAL LOW (ref 12.0–15.0)
Immature Granulocytes: 0 %
Lymphocytes Relative: 33 %
Lymphs Abs: 1.6 10*3/uL (ref 0.7–4.0)
MCH: 25.2 pg — ABNORMAL LOW (ref 26.0–34.0)
MCHC: 31.4 g/dL (ref 30.0–36.0)
MCV: 80.5 fL (ref 80.0–100.0)
Monocytes Absolute: 0.3 10*3/uL (ref 0.1–1.0)
Monocytes Relative: 5 %
Neutro Abs: 2.9 10*3/uL (ref 1.7–7.7)
Neutrophils Relative %: 59 %
Platelets: 422 10*3/uL — ABNORMAL HIGH (ref 150–400)
RBC: 4.4 MIL/uL (ref 3.87–5.11)
RDW: 16.6 % — ABNORMAL HIGH (ref 11.5–15.5)
WBC: 4.9 10*3/uL (ref 4.0–10.5)
nRBC: 0 % (ref 0.0–0.2)

## 2023-01-27 MED ORDER — ALBUTEROL SULFATE (2.5 MG/3ML) 0.083% IN NEBU
2.5000 mg | INHALATION_SOLUTION | Freq: Once | RESPIRATORY_TRACT | Status: AC
  Administered 2023-01-27: 2.5 mg via RESPIRATORY_TRACT
  Filled 2023-01-27: qty 3

## 2023-01-27 NOTE — Discharge Instructions (Signed)
Please follow-up with your PCP as needed for medication adjustment

## 2023-01-27 NOTE — ED Provider Notes (Signed)
[facility] EMERGENCY DEPARTMENT AT [facility] Provider Note   CSN: [id number] Arrival date & time: 01/27/23  1011     History  Chief Complaint  Patient presents with   Chest Pain    Ruth [name] is a 47 y.o. female.  Patient is a 48 year old woman with a history of asthma, presenting with chest pain. Patient describes substernal non-radiating pleuritic pain that began last night. She has had a cough and slight shortness of breath for that last two days. Also has had a headache. Denies fever, constipation, diarrhea.    Chest Pain Associated symptoms: cough and shortness of breath   Associated symptoms: no abdominal pain        Home Medications Prior to Admission medications   Medication Sig Start Date End Date Taking? Authorizing Provider  albuterol (PROVENTIL) (2.5 MG/3ML) 0.083% nebulizer solution Take 3 mLs (2.5 mg total) by nebulization every 6 (six) hours as needed for wheezing or shortness of breath. 10/21/22   Garrison, [place] N, FNP  albuterol (VENTOLIN HFA) 108 (90 Base) MCG/ACT inhaler Inhale into the lungs. 05/03/16   [provider]  carbamide peroxide (DEBROX) 6.5 % OTIC solution Place 5 drops into the right ear 2 (two) times daily. 10/21/22   Garrison, [place] N, FNP  cetirizine (ZYRTEC ALLERGY) 10 MG tablet Take 1 tablet (10 mg total) by mouth daily. 10/04/22 01/27/23  Garrison, [place] N, FNP  Continuous Glucose Receiver (DEXCOM G7 RECEIVER) DEVI Use to check blood sugar three times a day . 10/27/22   Georganna Skeans, MD  Continuous Glucose Sensor (DEXCOM G7 SENSOR) MISC Use to check blood sugar three time daily. Change sensors once every 10 days. 01/26/23 04/30/23  Georganna Skeans, MD  doxycycline (VIBRAMYCIN) 100 MG capsule Take 1 capsule (100 mg total) by mouth 2 (two) times daily. 10/04/22   Garrison, [place] N, FNP  fluconazole (DIFLUCAN) 150 MG tablet Take 1 tablet (150 mg total) by mouth once a week. 11/27/22   [name],  PA-C  fluticasone (FLONASE) 50 MCG/ACT nasal spray Place 1 spray into both nostrils daily. 10/04/22   Garrison, [place] N, FNP  fluticasone (FLOVENT HFA) 110 MCG/ACT inhaler Inhale into the lungs. 11/11/16   [provider]  hydrOXYzine (ATARAX) 25 MG tablet Take 1 tablet (25 mg total) by mouth 3 (three) times daily as needed. 07/13/22   Georganna Skeans, MD  medroxyPROGESTERone (PROVERA) 10 MG tablet Take by mouth. 08/07/16   [provider]  metFORMIN (GLUCOPHAGE) 500 MG tablet Take by mouth. 11/11/16   [provider]  montelukast (SINGULAIR) 10 MG tablet Take by mouth. 11/11/16   [provider]  nystatin cream (MYCOSTATIN) Apply 1 Application topically 2 (two) times daily. 11/27/22   [name], PA-C  OLANZapine (ZYPREXA) 5 MG tablet Take 1 tablet (5 mg total) by mouth at bedtime. 08/10/22   Georganna Skeans, MD  promethazine-dextromethorphan (PROMETHAZINE-DM) 6.25-15 MG/5ML syrup Take 5 mLs by mouth 3 (three) times daily as needed. 10/21/22   Garrison, [place] N, FNP  Semaglutide,0.25 or 0.5MG /DOS, (OZEMPIC, 0.25 OR 0.5 MG/DOSE,) 2 MG/3ML SOPN Inject 0.5 mg into the skin once a week. 01/19/23   Hoy Register, MD  traMADol (ULTRAM) 50 MG tablet Take by mouth. 11/11/16   [provider]      Allergies    Metformin and related, Penicillins, and Victoza [liraglutide]    Review of Systems   Review of Systems  Respiratory:  Positive for cough and shortness of breath.  Cardiovascular:  Positive for chest pain.  Gastrointestinal:  Negative for abdominal pain, constipation and diarrhea.    Physical Exam Updated Vital Signs BP (!) 142/96 (BP Location: Right Wrist)   Pulse 72   Temp 98 F (36.7 C) (Oral)   Resp 15   Ht 5\' 6"  (1.676 m)   Wt 111 kg   LMP 07/05/2022   SpO2 100%   BMI 39.50 kg/m  Physical Exam Constitutional:      General: She is not in acute distress.    Appearance: She is not ill-appearing.  Cardiovascular:     Rate and Rhythm:  Normal rate and regular rhythm.     Heart sounds: Normal heart sounds.  Pulmonary:     Effort: Pulmonary effort is normal.  Chest:     Chest wall: No tenderness.  Abdominal:     Palpations: Abdomen is soft.     Tenderness: There is no abdominal tenderness.  Neurological:     Mental Status: She is alert.     ED Results / Procedures / Treatments   Labs (all labs ordered are listed, but only abnormal results are displayed) Labs Reviewed - No data to display  EKG EKG Interpretation Date/Time:  Wednesday January 27 2023 10:16:57 EDT Ventricular Rate:  73 PR Interval:  152 QRS Duration:  94 QT Interval:  422 QTC Calculation: 465 R Axis:   -8  Text Interpretation: Sinus rhythm Confirmed by Lorre Nick ([id number]) on 01/27/2023 10:49:02 AM  Radiology No results found.  Procedures Procedures    Medications Ordered in ED Medications - No data to display  ED Course/ Medical Decision Making/ A&P                             Medical Decision Making Medical Decision Making:   Ruth [name] is a 47 y.o. female who presented to the ED today with chest pain detailed above.    Patient's presentation is complicated by their history of asthma.  Complete initial physical exam performed, notably the patient  was in no acute respiratory distress.    Reviewed and confirmed nursing documentation for past medical history, family history, social history.    Initial Assessment:   With the patient's presentation of chest pain, most likely diagnosis is asthma exacerbation. Other diagnoses were considered including (but not limited to) ACS, pneumonia, esophagitis. These are considered less likely due to history of present illness and physical exam findings.    Initial Plan:  EKG to evaluate for cardiac pathology Objective evaluation as below reviewed   EKG EKG was reviewed independently. Rate, rhythm, axis, intervals all examined and without medically relevant abnormality. ST  segments without concerns for elevations.    Radiology:  All images reviewed independently. Agree with radiology report at this time.   No results found.   Reassessment and Plan:   Patient is a 47 year old woman presenting with chest pain. Patient feels like her pain is due to her asthma. Vitals were hemodynamically stable throughout stay in the ED. Patient was in no respiratory distress. A Duoneb was administered for symptomatic relief, with improval of chest pain. EKG showed no acute cardiac pathology. Labs were within normal limits. On reassessment, patient was resting comfortably in bed. I feel patient can be discharged safely as life-threatening pathology has been ruled out at this time. Patient agrees with plan to be discharged.         Final Clinical Impression(s) /  ED Diagnoses Final diagnoses:  None    Rx / DC Orders ED Discharge Orders     None         Monna Fam, MD 01/27/23 1228    Lorre Nick, MD 01/28/23 [phone]

## 2023-01-27 NOTE — ED Triage Notes (Addendum)
C/O intermittent generalized mid-chest pain onset yesterday; states feels like "my asthma" but also feels like "when I had my mild heart attack when my potassium was really low". States pain lasts momentarily "just before, during, and right after" coughing. Denies any chest pain at present. Reports cough and respiratory sxs with fatigue over past 2 days. Denies fevers. Skin warm and pink. Also c/o vaginal irritation onset yesterday; pt feels she may have yeast infection.

## 2023-01-27 NOTE — ED Notes (Signed)
Patient resting comfortably in bed, husband at bedside

## 2023-01-27 NOTE — ED Triage Notes (Signed)
Pt reports CP yesterday and believes it was her asthma. Central CP with no radiation, worse with deep inhalation. Endorses mild cough and headaches.

## 2023-01-27 NOTE — ED Provider Notes (Signed)
[facility]    CSN: [id number] Arrival date & time: 01/27/23  0844      History   Chief Complaint Chief Complaint  Patient presents with   Chest Pain   Vaginal Irritation    HPI Ruth [name] is a 47 y.o. female.   Patient here today for evaluation of substernal chest pain that started yesterday.  She reports that symptoms are similar to asthma complications but also similar to "when I had my heart attack when my potassium was really low".  She reports that pain does seem to be present with coughing as well as deep breathing.  She has had cough and fatigue for the last 2 days.  She has not had any known fever.  She reports some headaches but not associated with chest pain.  She has had some shortness of breath.  She also reports vaginal irritation that started yesterday.  She is concerned she may have yeast infection.  The history is provided by the patient.    Past Medical History:  Diagnosis Date   Abnormal uterine bleeding 2023   Anemia 2023   Anxiety    Arthritis    chronic back pain    Asthma    rarely uses inhaler   Bipolar 1 disorder (HCC)    Follows w/ PCP Dr. Georganna Skeans.   Fibromyalgia    GERD (gastroesophageal reflux disease)    Hidradenitis    multiple boils, axilla and groin (As of 08/03/22, patient has a boil in her left axilla.)   HLD (hyperlipidemia)    diet controlled   Hx of migraines    occ   Hypertension    no meds   Hypothyroidism    Intentional drug overdose (HCC) 03/28/2021   Obesity    Sleep apnea    Patient states that she had a sleep study in the past and was told she did not need a CPAP.   Thrombocytosis 01/26/2020   Type 2 diabetes mellitus (HCC)    Type 2, Follows w/ PCP Dr. Georganna Skeans.   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   BMI 45.0-49.9, adult (HCC) 09/04/2020   Right arm pain 02/14/2020    Hyperlipidemia associated with type 2 diabetes mellitus (HCC) 09/19/2019   Abnormal uterine bleeding (AUB) 12/07/2017   Abnormal mammogram of both breasts 11/02/2016   Hypothyroidism 06/21/2009   Type 2 diabetes mellitus with peripheral neuropathy (HCC) 06/21/2009   BIPOLAR AFFECTIVE DISORDER 06/21/2009   PERSISTENT DISORDER INITIATING/MAINTAINING SLEEP 06/21/2009   INADEQUATE SLEEP HYGIENE 06/21/2009   OBSTRUCTIVE SLEEP APNEA 06/21/2009   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   CARPAL TUNNEL SYNDROME 06/20/2009   MERALGIA PARESTHETICA 06/20/2009   Essential hypertension 06/20/2009   FIBROMYALGIA 06/20/2009   CEPHALGIA 06/20/2009    Past Surgical History:  Procedure Laterality Date   ANTERIOR CERVICAL DECOMP/DISCECTOMY FUSION N/A 01/16/2021   Procedure: Cervical five-six Anterior cervical decompression/discectomy/fusion;  Surgeon: Lisbeth Renshaw, MD;  Location: [facility] OR;  Service: Neurosurgery;  Laterality: N/A;   CARPAL TUNNEL RELEASE Right    around 2009   CESAREAN SECTION     x2, 2003 & 2008   CYSTOSCOPY N/A 08/12/2022   Procedure: CYSTOSCOPY;  Surgeon: Lorriane Shire, MD;  Location: [facility];  Service: Gynecology;  Laterality: N/A;   DILITATION & CURRETTAGE/HYSTROSCOPY WITH NOVASURE ABLATION N/A 12/07/2017   Procedure: DILATATION & CURETTAGE/HYSTEROSCOPY WITH ATTEMPED NOVASURE ABLATION, FAILED/ENDOMETERIAL  ABLATION WITH HTA SYSTEM;  Surgeon: Willodean Rosenthal, MD;  Location: [facility];  Service: Gynecology;  Laterality: N/A;   ELBOW SURGERY     "surgery for nerve pain" around 2009   ENDOMETRIAL BIOPSY  07/23/2022   negative for hyperplasia or malignancy   GANGLION CYST EXCISION Right    right arm    RECTOVAGINAL FISTULA CLOSURE     in [id number],[id number], 2006   ROBOTIC ASSISTED TOTAL HYSTERECTOMY Bilateral 08/12/2022   Procedure: XI ROBOTIC ASSISTED TOTAL HYSTERECTOMY WITH SALPINGECTOMY;  Surgeon: Lorriane Shire, MD;  Location: 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 Authorizing Provider  albuterol (VENTOLIN HFA) 108 (90 Base) MCG/ACT inhaler Inhale into the lungs. 05/03/16  Yes [provider]  cetirizine (ZYRTEC ALLERGY) 10 MG tablet Take 1 tablet (10 mg total) by mouth daily. 10/04/22 01/27/23 Yes Rinaldo Ratel, [place] N, FNP  Continuous Glucose Receiver (DEXCOM G7 RECEIVER) DEVI Use to check blood sugar three times a day . 10/27/22  Yes Georganna Skeans, MD  Continuous Glucose Sensor (DEXCOM G7 SENSOR) MISC Use to check blood sugar three time daily. Change sensors once every 10 days. 01/26/23 04/30/23 Yes Georganna Skeans, MD  fluticasone [facility]) 50 MCG/ACT nasal spray Place 1 spray into both nostrils daily. 10/04/22  Yes Rinaldo Ratel, [place] N, FNP  hydrOXYzine (ATARAX) 25 MG tablet Take 1 tablet (25 mg total) by mouth 3 (three) times daily as needed. 07/13/22  Yes Georganna Skeans, MD  medroxyPROGESTERone (PROVERA) 10 MG tablet Take by mouth. 08/07/16  Yes [provider]  OLANZapine (ZYPREXA) 5 MG tablet Take 1 tablet (5 mg total) by mouth at bedtime. 08/10/22  Yes Georganna Skeans, MD  Semaglutide,0.25 or 0.5MG /DOS, (OZEMPIC, 0.25 OR 0.5 MG/DOSE,) 2 MG/3ML SOPN Inject 0.5 mg into the skin once a week. 01/19/23  Yes Hoy Register, MD  traMADol (ULTRAM) 50 MG tablet Take  by mouth. 11/11/16  Yes [provider]  albuterol (PROVENTIL) (2.5 MG/3ML) 0.083% nebulizer solution Take 3 mLs (2.5 mg total) by nebulization every 6 (six) hours as needed for wheezing or shortness of breath. 10/21/22   Garrison, [place] N, FNP  carbamide peroxide (DEBROX) 6.5 % OTIC solution Place 5 drops into the right ear 2 (two) times daily. 10/21/22   Garrison, [place] N, FNP  doxycycline (VIBRAMYCIN) 100 MG capsule Take 1 capsule (100 mg total) by mouth 2 (two) times daily. 10/04/22   Garrison, [place] N, FNP  fluconazole (DIFLUCAN) 150 MG tablet Take 1 tablet (150 mg total) by mouth once  a week. 11/27/22   [name], PA-C  fluticasone (FLOVENT HFA) 110 MCG/ACT inhaler Inhale into the lungs. 11/11/16   [provider]  metFORMIN (GLUCOPHAGE) 500 MG tablet Take by mouth. 11/11/16   [provider]  montelukast (SINGULAIR) 10 MG tablet Take by mouth. 11/11/16   [provider]  nystatin cream (MYCOSTATIN) Apply 1 Application topically 2 (two) times daily. 11/27/22   [name], PA-C  promethazine-dextromethorphan (PROMETHAZINE-DM) 6.25-15 MG/5ML syrup Take 5 mLs by mouth 3 (three) times daily as needed. 10/21/22   Garrison, [place] N, FNP    Family History Family History  Problem Relation Age of Onset   Thyroid disease Mother    Diabetes Daughter    Diabetes Son    Breast cancer Neg Hx     Social History Social History   Tobacco Use   Smoking status: Every Day    Current packs/day: 0.25    Types: Cigarettes   Smokeless tobacco: Never   Tobacco comments:    Per patient, she only smokes 2 to 3 cigarettes daily as of 08/03/22.  Vaping Use   Vaping status: Never Used  Substance Use Topics   Alcohol use: Not Currently   Drug use: Yes    Types: Marijuana    Comment: Occasional use only     Allergies   Metformin and related, Penicillins, and Victoza [liraglutide]   Review of Systems Review of Systems  Constitutional:  Negative  for chills and fever.  Eyes:  Negative for discharge and redness.  Respiratory:  Positive for cough and shortness of breath.   Cardiovascular:  Positive for chest pain.  Gastrointestinal:  Negative for abdominal pain, nausea and vomiting.  Neurological:  Positive for headaches.     Physical Exam Triage Vital Signs ED Triage Vitals  Encounter Vitals Group     BP      Systolic BP Percentile      Diastolic BP Percentile      Pulse      Resp      Temp      Temp src      SpO2      Weight      Height      Head Circumference      Peak Flow      Pain Score      Pain Loc      Pain Education      Exclude from Growth Chart    No data found.  Updated Vital Signs BP 128/85   Pulse 77   Temp 98.2 F (36.8 C) (Oral)   Resp 20   LMP 07/05/2022   SpO2 98%       Physical Exam Vitals and nursing note reviewed.  Constitutional:      General: She is not in acute distress.    Appearance: Normal appearance. She is not ill-appearing.  HENT:     Head: Normocephalic and atraumatic.     Nose: Nose normal.     Mouth/Throat:     Mouth: Mucous membranes are moist.     Pharynx: Oropharynx is clear.  Eyes:     Conjunctiva/sclera: Conjunctivae normal.  Cardiovascular:     Rate and Rhythm: Normal rate and regular rhythm.  Pulmonary:     Effort: Pulmonary effort is normal. No respiratory distress.     Breath sounds: Normal breath sounds. No wheezing, rhonchi or rales.  Neurological:     Mental Status: She is alert.  Psychiatric:  Mood and Affect: Mood normal.        Behavior: Behavior normal.        Thought Content: Thought content normal.      UC Treatments / Results  Labs (all labs ordered are listed, but only abnormal results are displayed) Labs Reviewed  CERVICOVAGINAL ANCILLARY ONLY    EKG   Radiology No results found.  Procedures Procedures (including critical care time)  Medications Ordered in UC Medications - No data to display  Initial Impression  / Assessment and Plan / UC Course  I have reviewed the triage vital signs and the nursing notes.  Pertinent labs & imaging results that were available during my care of the patient were reviewed by me and considered in my medical decision making (see chart for details).    Given history of MI per patient recommended further evaluation in the emergency room for stat labs to rule out cardiac etiology of pain.  Discussed most likely related to cough, etc. but without labs cannot rule out cardiac cause.  Patient is agreeable to report to ED and husband is here with her.  Report via POV as vital signs are stable, patient nondiaphoretic and in no current significant pain.  Cervicovaginal swab ordered for further evaluation of vaginal irritation.  Will await results further recommendation.  Final Clinical Impressions(s) / UC Diagnoses   Final diagnoses:  Acute vaginitis  Chest pain, unspecified type     Discharge Instructions      Please report to ED immediately after leaving our office.      ED Prescriptions   None    PDMP not reviewed this encounter.   Tomi Bamberger, PA-C 01/27/23 [phone]

## 2023-01-27 NOTE — Discharge Instructions (Signed)
  Please report to ED immediately after leaving our office.

## 2023-01-27 NOTE — ED Notes (Signed)
Pt resting comfortably on bed. Pt family member @bedside . Pt has no needs at this time. Bed locked and in lowest position. Call light within reach. Vitals being monitored.

## 2023-01-28 LAB — CERVICOVAGINAL ANCILLARY ONLY
Bacterial Vaginitis (gardnerella): NEGATIVE
Candida Glabrata: NEGATIVE
Candida Vaginitis: NEGATIVE
Chlamydia: NEGATIVE
Comment: NEGATIVE
Comment: NEGATIVE
Comment: NEGATIVE
Comment: NEGATIVE
Comment: NORMAL
Neisseria Gonorrhea: NEGATIVE
Trichomonas: NEGATIVE

## 2023-01-31 ENCOUNTER — Other Ambulatory Visit: Payer: Self-pay | Admitting: Obstetrics and Gynecology

## 2023-01-31 ENCOUNTER — Ambulatory Visit (HOSPITAL_COMMUNITY)
Admission: EM | Admit: 2023-01-31 | Discharge: 2023-01-31 | Disposition: A | Discharge status: Home / Self Care | Payer: Medicaid Other | Attending: Physician Assistant | Admitting: Physician Assistant

## 2023-01-31 ENCOUNTER — Encounter (HOSPITAL_COMMUNITY): Payer: Self-pay

## 2023-01-31 DIAGNOSIS — L02214 Cutaneous abscess of groin: Secondary | ICD-10-CM

## 2023-01-31 DIAGNOSIS — R309 Painful micturition, unspecified: Secondary | ICD-10-CM

## 2023-01-31 MED ORDER — IBUPROFEN 800 MG PO TABS: 800.0000 mg | ORAL_TABLET | Freq: Three times a day (TID) | ORAL | 0 refills | Status: DC

## 2023-01-31 MED ORDER — MUPIROCIN 2 % EX OINT: 1.0000 | TOPICAL_OINTMENT | Freq: Two times a day (BID) | CUTANEOUS | 0 refills | Status: DC

## 2023-01-31 MED ORDER — LIDOCAINE-EPINEPHRINE 1 %-1:100000 IJ SOLN
INTRAMUSCULAR | Status: AC
  Filled 2023-01-31: qty 1

## 2023-01-31 MED ORDER — SULFAMETHOXAZOLE-TRIMETHOPRIM 800-160 MG PO TABS: 1.0000 | ORAL_TABLET | Freq: Two times a day (BID) | ORAL | 0 refills | Status: AC

## 2023-01-31 NOTE — ED Provider Notes (Signed)
[facility]    CSN: [id number] Arrival date & time: 01/31/23  1000      History   Chief Complaint Chief Complaint  Patient presents with   Abscess    HPI Ruth [name] is a 47 y.o. female.   Patient presents today with a several day history of painful lump in her left groin.  She does have a history of recurrent abscesses and a diagnosis of hidradenitis suppurativa.  She is not currently taking any immune modulating medication or following with dermatology for this condition.  She denies any recent antibiotics in the past 90 days.  She is allergic to penicillin.  She reports that area is very painful and is difficult for her to walk as result of the pain.  Pain is rated 8 on a 0-10 pain scale, described as sharp, worse with movement or palpation, no alleviating factors identified.  She denies any fever, nausea, vomiting.  She is diabetic but reports that her blood sugars are well-controlled.  She has a Dexcom and currently her blood sugar is steady at 120.    Past Medical History:  Diagnosis Date   Abnormal uterine bleeding 2023   Anemia 2023   Anxiety    Arthritis    chronic back pain    Asthma    rarely uses inhaler   Bipolar 1 disorder (HCC)    Follows w/ PCP Dr. Georganna Skeans.   Fibromyalgia    GERD (gastroesophageal reflux disease)    Hidradenitis    multiple boils, axilla and groin (As of 08/03/22, patient has a boil in her left axilla.)   HLD (hyperlipidemia)    diet controlled   Hx of migraines    occ   Hypertension    no meds   Hypothyroidism    Intentional drug overdose (HCC) 03/28/2021   Obesity    Sleep apnea    Patient states that she had a sleep study in the past and was told she did not need a CPAP.   Thrombocytosis 01/26/2020   Type 2 diabetes mellitus (HCC)    Type 2, Follows w/ PCP Dr. Georganna Skeans.   Wears glasses     Patient Active Problem List   Diagnosis Date Noted   Stenosis of cervical spine with myelopathy (HCC)  01/16/2021   BMI 45.0-49.9, adult (HCC) 09/04/2020   Right arm pain 02/14/2020   Hyperlipidemia associated with type 2 diabetes mellitus (HCC) 09/19/2019   Abnormal uterine bleeding (AUB) 12/07/2017   Abnormal mammogram of both breasts 11/02/2016   Hypothyroidism 06/21/2009   Type 2 diabetes mellitus with peripheral neuropathy (HCC) 06/21/2009   BIPOLAR AFFECTIVE DISORDER 06/21/2009   PERSISTENT DISORDER INITIATING/MAINTAINING SLEEP 06/21/2009   INADEQUATE SLEEP HYGIENE 06/21/2009   OBSTRUCTIVE SLEEP APNEA 06/21/2009   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   CARPAL TUNNEL SYNDROME 06/20/2009   MERALGIA PARESTHETICA 06/20/2009   Essential hypertension 06/20/2009   FIBROMYALGIA 06/20/2009   CEPHALGIA 06/20/2009    Past Surgical History:  Procedure Laterality Date   ANTERIOR CERVICAL DECOMP/DISCECTOMY FUSION N/A 01/16/2021   Procedure: Cervical five-six Anterior cervical decompression/discectomy/fusion;  Surgeon: Lisbeth Renshaw, MD;  Location: [facility] OR;  Service: Neurosurgery;  Laterality: N/A;   CARPAL TUNNEL RELEASE Right    around 2009   CESAREAN SECTION     x2, 2003 & 2008   CYSTOSCOPY N/A 08/12/2022   Procedure: CYSTOSCOPY;  Surgeon: Lorriane Shire, MD;  Location: [facility];  Service: Gynecology;  Laterality: N/A;   DILITATION & CURRETTAGE/HYSTROSCOPY WITH NOVASURE  ABLATION N/A 12/07/2017   Procedure: DILATATION & CURETTAGE/HYSTEROSCOPY WITH ATTEMPED NOVASURE ABLATION, FAILED/ENDOMETERIAL ABLATION WITH HTA SYSTEM;  Surgeon: Willodean Rosenthal, MD;  Location: [facility];  Service: Gynecology;  Laterality: N/A;   ELBOW SURGERY     "surgery for nerve pain" around 2009   ENDOMETRIAL BIOPSY  07/23/2022   negative for hyperplasia or malignancy   GANGLION CYST EXCISION Right    right arm    RECTOVAGINAL FISTULA CLOSURE     in [id number],[id number], 2006   ROBOTIC ASSISTED TOTAL HYSTERECTOMY Bilateral 08/12/2022   Procedure: XI ROBOTIC ASSISTED TOTAL HYSTERECTOMY WITH SALPINGECTOMY;   Surgeon: Lorriane Shire, MD;  Location: [facility];  Service: Gynecology;  Laterality: Bilateral;   TONSILLECTOMY     childhood   TUBAL LIGATION  2008   XI ROBOT ASSISTED RECTOPEXY N/A 08/12/2022   Procedure: XI robot repair serosal repair of rectum;  Surgeon: Karie Soda, MD;  Location: [facility];  Service: General;  Laterality: N/A;    OB History     Gravida  7   Para  5   Term  4   Preterm  1   AB  2   Living  5      SAB  2   IAB      Ectopic      Multiple      Live Births  5        Obstetric Comments  Vag x 2>c/s emergency>vbac>c/s and BTL 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 Authorizing Provider  albuterol (PROVENTIL) (2.5 MG/3ML) 0.083% nebulizer solution Take 3 mLs (2.5 mg total) by nebulization every 6 (six) hours as needed for wheezing or shortness of breath. 10/21/22  Yes Rinaldo Ratel, [place] N, FNP  albuterol (VENTOLIN HFA) 108 (90 Base) MCG/ACT inhaler Inhale into the lungs. 05/03/16  Yes [provider]  cetirizine (ZYRTEC ALLERGY) 10 MG tablet Take 1 tablet (10 mg total) by mouth daily. 10/04/22 01/31/23 Yes Rinaldo Ratel, [place] N, FNP  fluticasone [facility]) 50 MCG/ACT nasal spray Place 1 spray into both nostrils daily. 10/04/22  Yes Rinaldo Ratel, [place] N, FNP  hydrOXYzine (ATARAX) 25 MG tablet Take 1 tablet (25 mg total) by mouth 3 (three) times daily as needed. 07/13/22  Yes Georganna Skeans, MD  ibuprofen (ADVIL) 800 MG tablet Take 1 tablet (800 mg total) by mouth 3 (three) times daily. 01/31/23  Yes Cyndee Giammarco, Denny Peon K, PA-C  mupirocin ointment (BACTROBAN) 2 % Apply 1 Application topically 2 (two) times daily. 01/31/23  Yes Esley Brooking K, PA-C  OLANZapine (ZYPREXA) 5 MG tablet Take 1 tablet (5 mg total) by mouth at bedtime. 08/10/22  Yes Georganna Skeans, MD  Semaglutide,0.25 or 0.5MG /DOS, (OZEMPIC, 0.25  OR 0.5 MG/DOSE,) 2 MG/3ML SOPN Inject 0.5 mg into the skin once a week. 01/19/23  Yes Hoy Register, MD  sulfamethoxazole-trimethoprim (BACTRIM DS) 800-160 MG tablet Take 1 tablet by mouth 2 (two) times daily for 7 days. 01/31/23 02/07/23 Yes Stepheni Cameron, Noberto Retort, PA-C  traMADol (ULTRAM) 50 MG tablet Take by mouth. 11/11/16  Yes [provider]  Continuous Glucose Receiver (DEXCOM G7 RECEIVER) DEVI Use to check blood sugar three times a day . 10/27/22   Georganna Skeans, MD  Continuous Glucose Sensor (DEXCOM G7 SENSOR) MISC Use to check blood sugar three time daily. Change sensors once every 10 days. 01/26/23  04/30/23  Georganna Skeans, MD  fluticasone (FLOVENT HFA) 110 MCG/ACT inhaler Inhale into the lungs. 11/11/16   [provider]  montelukast (SINGULAIR) 10 MG tablet Take by mouth. 11/11/16   [provider]  nystatin cream (MYCOSTATIN) Apply 1 Application topically 2 (two) times daily. 11/27/22   [name], PA-C    Family History Family History  Problem Relation Age of Onset   Thyroid disease Mother    Diabetes Daughter    Diabetes Son    Breast cancer Neg Hx     Social History Social History   Tobacco Use   Smoking status: Every Day    Current packs/day: 0.25    Types: Cigarettes   Smokeless tobacco: Never   Tobacco comments:    Per patient, she only smokes 2 to 3 cigarettes daily as of 08/03/22.  Vaping Use   Vaping status: Never Used  Substance Use Topics   Alcohol use: Not Currently   Drug use: Yes    Types: Marijuana    Comment: Occasional use only     Allergies   Metformin and related, Penicillins, and Victoza [liraglutide]   Review of Systems Review of Systems  Constitutional:  Positive for activity change. Negative for appetite change, fatigue and fever.  Gastrointestinal:  Negative for abdominal pain, diarrhea, nausea and vomiting.  Musculoskeletal:  Negative for arthralgias and myalgias.  Skin:  Positive for color change and wound.      Physical Exam Triage Vital Signs ED Triage Vitals  Encounter Vitals Group     BP 01/31/23 1011 137/88     Systolic BP Percentile --      Diastolic BP Percentile --      Pulse Rate 01/31/23 1011 89     Resp 01/31/23 1011 16     Temp 01/31/23 1011 98.1 F (36.7 C)     Temp Source 01/31/23 1011 Oral     SpO2 01/31/23 1011 96 %     Weight 01/31/23 1018 250 lb (113.4 kg)     Height --      Head Circumference --      Peak Flow --      Pain Score 01/31/23 1010 8     Pain Loc --      Pain Education --      Exclude from Growth Chart --    No data found.  Updated Vital Signs BP 137/88 (BP Location: Right Arm)   Pulse 89   Temp 98.1 F (36.7 C) (Oral)   Resp 16   Wt 250 lb (113.4 kg)   LMP 07/05/2022   SpO2 96%   BMI 40.35 kg/m   Visual Acuity Right Eye Distance:   Left Eye Distance:   Bilateral Distance:    Right Eye Near:   Left Eye Near:    Bilateral Near:     Physical Exam Vitals reviewed.  Constitutional:      General: She is awake. She is not in acute distress.    Appearance: Normal appearance. She is well-developed. She is not ill-appearing.     Comments: Very pleasant female appears stated age in no acute distress sitting comfortably in exam room  HENT:     Head: Normocephalic and atraumatic.  Pulmonary:     Effort: Pulmonary effort is normal. No accessory muscle usage or respiratory distress.     Breath sounds: Normal breath sounds.  Abdominal:     Tenderness: There is no abdominal tenderness.  Skin:    Findings: Abscess  present.       Psychiatric:        Behavior: Behavior is cooperative.      UC Treatments / Results  Labs (all labs ordered are listed, but only abnormal results are displayed) Labs Reviewed - No data to display  EKG   Radiology No results found.  Procedures Incision and Drainage  Date/Time: 01/31/2023 10:46 AM  Performed by: Jeani Hawking, PA-C Authorized by: Jeani Hawking, PA-C   Consent:    Consent  obtained:  Verbal   Consent given by:  Patient   Risks discussed:  Bleeding, incomplete drainage, infection and pain   Alternatives discussed:  Observation, alternative treatment and referral Universal protocol:    Procedure explained and questions answered to patient or proxy's satisfaction: yes   Location:    Type:  Abscess   Size:  4 cm x 2 cm   Location:  Anogenital   Anogenital location: left inguinal crease. Pre-procedure details:    Skin preparation:  Chlorhexidine with alcohol Sedation:    Sedation type:  None Anesthesia:    Anesthesia method:  Local infiltration   Local anesthetic:  Lidocaine 1% WITH epi Procedure type:    Complexity:  Complex Procedure details:    Incision types:  Stab incision   Incision depth:  Dermal   Wound management:  Irrigated with saline and probed and deloculated   Drainage:  Bloody and purulent   Drainage amount:  Moderate   Wound treatment:  Wound left open   Packing materials:  None Post-procedure details:    Procedure completion:  Tolerated well, no immediate complications  (including critical care time)  Medications Ordered in UC Medications - No data to display  Initial Impression / Assessment and Plan / UC Course  I have reviewed the triage vital signs and the nursing notes.  Pertinent labs & imaging results that were available during my care of the patient were reviewed by me and considered in my medical decision making (see chart for details).     Patient is well-appearing, afebrile, nontoxic, nontachycardic.  Abscess was successfully drained in clinic.  See procedure note above.  Patient had improvement of symptoms following this procedure.  Will start Bactrim DS twice daily for 7 days.  She was also encouraged to keep area clean with soap and water and apply Bactroban ointment twice daily with dressing changes.  She is to do warm compresses a few times per day to encourage drainage and to help manage symptoms.  Recommended  over-the-counter analgesics for pain relief and prescription for ibuprofen 800 with sent to pharmacy.  We discussed that she should not take additional NSAIDs with this medication.  If she has any worsening or changing symptoms including recurrent pain, enlarging lesion, fever, nausea, vomiting she needs to be seen emergently.  Strict return precautions given.  Work excuse note provided.  Final Clinical Impressions(s) / UC Diagnoses   Final diagnoses:  Abscess of left groin     Discharge Instructions      We drained this abscess today.  Please keep this area clean and apply Bactroban ointment with dressing changes.  Start Bactrim DS twice daily.  If you have any rash or oral lesions stop the medication to be seen immediately.  I have sent in ibuprofen 800 to the pharmacy.  Do not take NSAIDs with this medication due to risk of GI bleeding.  This includes additional ibuprofen/Advil/Motrin, naproxen/Aleve, aspirin.  You can use Tylenol for breakthrough pain.  If you  have any recurrent pain, enlarging lesion, change in drainage, fever, nausea, vomiting you need to be seen immediately.     ED Prescriptions     Medication Sig Dispense Auth. Provider   sulfamethoxazole-trimethoprim (BACTRIM DS) 800-160 MG tablet Take 1 tablet by mouth 2 (two) times daily for 7 days. 14 tablet Veasna Santibanez K, PA-C   mupirocin ointment (BACTROBAN) 2 % Apply 1 Application topically 2 (two) times daily. 22 g Louvina Cleary K, PA-C   ibuprofen (ADVIL) 800 MG tablet Take 1 tablet (800 mg total) by mouth 3 (three) times daily. 21 tablet Akirah Storck, Noberto Retort, PA-C      PDMP not reviewed this encounter.   Jeani Hawking, PA-C 01/31/23 1050

## 2023-01-31 NOTE — ED Triage Notes (Signed)
Patient here today with c/o bump on the left groin area. She noticed it yesterday.

## 2023-01-31 NOTE — Discharge Instructions (Signed)
We drained this abscess today.  Please keep this area clean and apply Bactroban ointment with dressing changes.  Start Bactrim DS twice daily.  If you have any rash or oral lesions stop the medication to be seen immediately.  I have sent in ibuprofen 800 to the pharmacy.  Do not take NSAIDs with this medication due to risk of GI bleeding.  This includes additional ibuprofen/Advil/Motrin, naproxen/Aleve, aspirin.  You can use Tylenol for breakthrough pain.  If you have any recurrent pain, enlarging lesion, change in drainage, fever, nausea, vomiting you need to be seen immediately.

## 2023-02-01 ENCOUNTER — Encounter: Payer: Self-pay | Admitting: Family Medicine

## 2023-02-01 ENCOUNTER — Ambulatory Visit (INDEPENDENT_AMBULATORY_CARE_PROVIDER_SITE_OTHER): Payer: Medicaid Other | Admitting: Family Medicine

## 2023-02-01 VITALS — BP 126/85 | HR 77 | Temp 97.9°F | Resp 16 | Wt 254.0 lb

## 2023-02-01 DIAGNOSIS — F3161 Bipolar disorder, current episode mixed, mild: Secondary | ICD-10-CM

## 2023-02-01 DIAGNOSIS — I10 Essential (primary) hypertension: Secondary | ICD-10-CM

## 2023-02-01 DIAGNOSIS — E1169 Type 2 diabetes mellitus with other specified complication: Secondary | ICD-10-CM

## 2023-02-01 DIAGNOSIS — F17200 Nicotine dependence, unspecified, uncomplicated: Secondary | ICD-10-CM | POA: Diagnosis not present

## 2023-02-01 DIAGNOSIS — J4540 Moderate persistent asthma, uncomplicated: Secondary | ICD-10-CM

## 2023-02-01 MED ORDER — FLUTICASONE PROPIONATE HFA 110 MCG/ACT IN AERO: 2.0000 | INHALATION_SPRAY | Freq: Two times a day (BID) | RESPIRATORY_TRACT | 5 refills | Status: DC

## 2023-02-01 MED ORDER — HYDROXYZINE HCL 25 MG PO TABS: 25.0000 mg | ORAL_TABLET | Freq: Three times a day (TID) | ORAL | 0 refills | Status: DC | PRN

## 2023-02-01 MED ORDER — OLANZAPINE 5 MG PO TABS: 5.0000 mg | ORAL_TABLET | Freq: Every day | ORAL | 0 refills | Status: DC

## 2023-02-01 MED ORDER — ALBUTEROL SULFATE HFA 108 (90 BASE) MCG/ACT IN AERS: 2.0000 | INHALATION_SPRAY | RESPIRATORY_TRACT | 5 refills | Status: DC | PRN

## 2023-02-01 MED ORDER — ALBUTEROL SULFATE (2.5 MG/3ML) 0.083% IN NEBU: 2.5000 mg | INHALATION_SOLUTION | Freq: Four times a day (QID) | RESPIRATORY_TRACT | 12 refills | Status: DC | PRN

## 2023-02-01 MED ORDER — MONTELUKAST SODIUM 10 MG PO TABS: 10.0000 mg | ORAL_TABLET | Freq: Every day | ORAL | 1 refills | Status: DC

## 2023-02-01 NOTE — Progress Notes (Signed)
Patient is her for medication refill. Patient has an abscess remove the day prior and is on ABX

## 2023-02-01 NOTE — Progress Notes (Signed)
Established Patient Office Visit  Subjective    Patient ID: Ruth [name], female    DOB: 09-Nov-1975  Age: 47 y.o. MRN: [id number]  CC: No chief complaint on file.   HPI Ruth [name] presents for review of chronic med issues. Patient denies acute complaints.    Outpatient Encounter Medications as of 02/01/2023  Medication Sig   albuterol (PROVENTIL) (2.5 MG/3ML) 0.083% nebulizer solution Take 3 mLs (2.5 mg total) by nebulization every 6 (six) hours as needed for wheezing or shortness of breath.   albuterol (VENTOLIN HFA) 108 (90 Base) MCG/ACT inhaler Inhale into the lungs.   cetirizine (ZYRTEC ALLERGY) 10 MG tablet Take 1 tablet (10 mg total) by mouth daily.   Continuous Glucose Receiver (DEXCOM G7 RECEIVER) DEVI Use to check blood sugar three times a day .   Continuous Glucose Sensor (DEXCOM G7 SENSOR) MISC Use to check blood sugar three time daily. Change sensors once every 10 days.   fluticasone (FLONASE) 50 MCG/ACT nasal spray Place 1 spray into both nostrils daily.   fluticasone (FLOVENT HFA) 110 MCG/ACT inhaler Inhale into the lungs.   hydrOXYzine (ATARAX) 25 MG tablet Take 1 tablet (25 mg total) by mouth 3 (three) times daily as needed.   ibuprofen (ADVIL) 800 MG tablet Take 1 tablet (800 mg total) by mouth 3 (three) times daily.   montelukast (SINGULAIR) 10 MG tablet Take by mouth.   mupirocin ointment (BACTROBAN) 2 % Apply 1 Application topically 2 (two) times daily.   nystatin cream (MYCOSTATIN) Apply 1 Application topically 2 (two) times daily.   OLANZapine (ZYPREXA) 5 MG tablet Take 1 tablet (5 mg total) by mouth at bedtime.   Semaglutide,0.25 or 0.5MG /DOS, (OZEMPIC, 0.25 OR 0.5 MG/DOSE,) 2 MG/3ML SOPN Inject 0.5 mg into the skin once a week.   sulfamethoxazole-trimethoprim (BACTRIM DS) 800-160 MG tablet Take 1 tablet by mouth 2 (two) times daily for 7 days.   traMADol (ULTRAM) 50 MG tablet Take by mouth.   No facility-administered encounter  medications on file as of 02/01/2023.    Past Medical History:  Diagnosis Date   Abnormal uterine bleeding 2023   Anemia 2023   Anxiety    Arthritis    chronic back pain    Asthma    rarely uses inhaler   Bipolar 1 disorder (HCC)    Follows w/ PCP Dr. Georganna Skeans.   Fibromyalgia    GERD (gastroesophageal reflux disease)    Hidradenitis    multiple boils, axilla and groin (As of 08/03/22, patient has a boil in her left axilla.)   HLD (hyperlipidemia)    diet controlled   Hx of migraines    occ   Hypertension    no meds   Hypothyroidism    Intentional drug overdose (HCC) 03/28/2021   Obesity    Sleep apnea    Patient states that she had a sleep study in the past and was told she did not need a CPAP.   Thrombocytosis 01/26/2020   Type 2 diabetes mellitus (HCC)    Type 2, Follows w/ PCP Dr. Georganna Skeans.   Wears glasses     Past Surgical History:  Procedure Laterality Date   ANTERIOR CERVICAL DECOMP/DISCECTOMY FUSION N/A 01/16/2021   Procedure: Cervical five-six Anterior cervical decompression/discectomy/fusion;  Surgeon: Lisbeth Renshaw, MD;  Location: [facility] OR;  Service: Neurosurgery;  Laterality: N/A;   CARPAL TUNNEL RELEASE Right    around 2009   CESAREAN SECTION     x2, 2003 & 2008  CYSTOSCOPY N/A 08/12/2022   Procedure: CYSTOSCOPY;  Surgeon: Lorriane Shire, MD;  Location: [facility];  Service: Gynecology;  Laterality: N/A;   DILITATION & CURRETTAGE/HYSTROSCOPY WITH NOVASURE ABLATION N/A 12/07/2017   Procedure: DILATATION & CURETTAGE/HYSTEROSCOPY WITH ATTEMPED NOVASURE ABLATION, FAILED/ENDOMETERIAL ABLATION WITH HTA SYSTEM;  Surgeon: Willodean Rosenthal, MD;  Location: [facility];  Service: Gynecology;  Laterality: N/A;   ELBOW SURGERY     "surgery for nerve pain" around 2009   ENDOMETRIAL BIOPSY  07/23/2022   negative for hyperplasia or malignancy   GANGLION CYST EXCISION Right    right arm    RECTOVAGINAL FISTULA CLOSURE     in 0981,1914,  2006   ROBOTIC ASSISTED TOTAL HYSTERECTOMY Bilateral 08/12/2022   Procedure: XI ROBOTIC ASSISTED TOTAL HYSTERECTOMY WITH SALPINGECTOMY;  Surgeon: Lorriane Shire, MD;  Location: [facility];  Service: Gynecology;  Laterality: Bilateral;   TONSILLECTOMY     childhood   TUBAL LIGATION  2008   XI ROBOT ASSISTED RECTOPEXY N/A 08/12/2022   Procedure: XI robot repair serosal repair of rectum;  Surgeon: Karie Soda, MD;  Location: [facility];  Service: General;  Laterality: N/A;    Family History  Problem Relation Age of Onset   Thyroid disease Mother    Diabetes Daughter    Diabetes Son    Breast cancer Neg Hx     Social History   Socioeconomic History   Marital status: Married    Spouse name: Not on file   Number of children: Not on file   Years of education: Not on file   Highest education level: Not on file  Occupational History   Not on file  Tobacco Use   Smoking status: Every Day    Current packs/day: 0.25    Types: Cigarettes   Smokeless tobacco: Never   Tobacco comments:    Per patient, she only smokes 2 to 3 cigarettes daily as of 08/03/22.  Vaping Use   Vaping status: Never Used  Substance and Sexual Activity   Alcohol use: Not Currently   Drug use: Yes    Types: Marijuana    Comment: Occasional use only   Sexual activity: Yes    Birth control/protection: Surgical    Comment: tubal ligation  Other Topics Concern   Not on file  Social History Narrative   Not on file   Social Determinants of Health   Financial Resource Strain: Not on file  Food Insecurity: Not on file  Transportation Needs: Not on file  Physical Activity: Not on file  Stress: Not on file  Social Connections: Not on file  Intimate Partner Violence: Not on file    Review of Systems  All other systems reviewed and are negative.       Objective    BP 126/85   Pulse 77   Temp 97.9 F (36.6 C) (Oral)   Resp 16   Wt 254 lb (115.2 kg)   LMP  07/05/2022   SpO2 97%   BMI 41.00 kg/m   Physical Exam Vitals and nursing note reviewed.  Constitutional:      General: She is not in acute distress. Cardiovascular:     Rate and Rhythm: Normal rate and regular rhythm.  Pulmonary:     Effort: Pulmonary effort is normal.     Breath sounds: Normal breath sounds.  Abdominal:     Palpations: Abdomen is soft.     Tenderness: There is no abdominal tenderness.  Neurological:  General: No focal deficit present.     Mental Status: She is alert and oriented to person, place, and time.  Psychiatric:        Mood and Affect: Affect normal. Mood is anxious.         Assessment & Plan:   1. Type 2 diabetes mellitus with other specified complication, without long-term current use of insulin (HCC) Most recent A1c is at [place]. Continue   2. Essential hypertension Appears stable. Continue   3. Moderate persistent asthma without complication Meds refilled  4. Smoking Discussed reduction/cessation   5. Bipolar disorder, current episode mixed, mild (HCC) Appears stable. Meds refilled.    No follow-ups on file.   Tommie Raymond, MD

## 2023-02-10 ENCOUNTER — Encounter: Payer: Self-pay | Admitting: Physician Assistant

## 2023-02-10 ENCOUNTER — Ambulatory Visit: Payer: Self-pay

## 2023-02-10 ENCOUNTER — Ambulatory Visit: Payer: Medicaid Other | Admitting: Physician Assistant

## 2023-02-10 VITALS — BP 126/77 | HR 95 | Ht 66.0 in | Wt 256.0 lb

## 2023-02-10 DIAGNOSIS — F17210 Nicotine dependence, cigarettes, uncomplicated: Secondary | ICD-10-CM | POA: Diagnosis not present

## 2023-02-10 DIAGNOSIS — E1142 Type 2 diabetes mellitus with diabetic polyneuropathy: Secondary | ICD-10-CM | POA: Diagnosis not present

## 2023-02-10 DIAGNOSIS — M722 Plantar fascial fibromatosis: Secondary | ICD-10-CM | POA: Diagnosis not present

## 2023-02-10 DIAGNOSIS — Z7985 Long-term (current) use of injectable non-insulin antidiabetic drugs: Secondary | ICD-10-CM

## 2023-02-10 DIAGNOSIS — L84 Corns and callosities: Secondary | ICD-10-CM

## 2023-02-10 DIAGNOSIS — I10 Essential (primary) hypertension: Secondary | ICD-10-CM

## 2023-02-10 NOTE — Telephone Encounter (Signed)
  Chief Complaint: foot pain  Symptoms: pain starts on foot pad near great toe, radiates to thigh Frequency: this am  Pertinent Negatives: Patient denies numbness Disposition: [] ED /[] Urgent Care (no appt availability in office) / [] Appointment(In office/virtual)/ []  [place] [facility]/ [] Home Care/ [] Refused Recommended Disposition /[x] [place] [facility]/ []  Follow-up with PCP Additional Notes: no appt today- advised mobile bus- address given Reason for Disposition  [1] SEVERE pain (e.g., excruciating, unable to do any normal activities) AND [2] not improved after 2 hours of pain medicine  Answer Assessment - Initial Assessment Questions 1. ONSET: "When did the pain start?"      This am  2. LOCATION: "Where is the pain located?"      Bottom of foot under toes  3. PAIN: "How bad is the pain?"    (Scale 1-10; or mild, moderate, severe)  - MILD (1-3): doesn't interfere with normal activities.   - MODERATE (4-7): interferes with normal activities (e.g., work or school) or awakens from sleep, limping.   - SEVERE (8-10): excruciating pain, unable to do any normal activities, unable to walk.      9/10  6. OTHER SYMPTOMS: "Do you have any other symptoms?" (e.g., leg pain, rash, fever, numbness)     Shoot up to thigh  Protocols used: Foot Pain-A-AH

## 2023-02-10 NOTE — Patient Instructions (Addendum)
I encourage you to use a frozen water bottle to roll your foot on, you can also use ibuprofen every 8 hours to help with the inflammation and pain.  I encourage you to use supportive footwear with good arch support.  I started a referral for you to be seen by podiatry for removal of the callus.  Roney Jaffe, PA-C Physician Assistant [facility] [email or web address]   Plantar Fasciitis  Plantar fasciitis is a painful foot condition that affects the heel. It occurs when the band of tissue that connects the toes to the heel bone (plantar fascia) becomes irritated. This can happen as the result of exercising too much or doing other repetitive activities (overuse injury). Plantar fasciitis can cause mild irritation to severe pain that makes it difficult to walk or move. The pain is usually worse in the morning after sleeping, or after sitting or lying down for a period of time. Pain may also be worse after long periods of walking or standing. What are the causes? This condition may be caused by: Standing for long periods of time. Wearing shoes that do not have good arch support. Doing activities that put stress on joints (high-impact activities). This includes ballet and exercise that makes your heart beat faster (aerobic exercise), such as running. Being overweight. An abnormal way of walking (gait). Tight muscles in the back of your lower leg (calf). High arches in your feet or flat feet. Starting a new athletic activity. What are the signs or symptoms? The main symptom of this condition is heel pain. Pain may get worse after the following: Taking the first steps after a time of rest, especially in the morning after awakening, or after you have been sitting or lying down for a while. Long periods of standing still. Pain may decrease after 30-45 minutes of activity, such as gentle walking. How is this diagnosed? This condition may be  diagnosed based on your medical history, a physical exam, and your symptoms. Your health care provider will check for: A tender area on the bottom of your foot. A high arch in your foot or flat feet. Pain when you move your foot. Difficulty moving your foot. You may have imaging tests to confirm the diagnosis, such as: X-rays. Ultrasound. MRI. How is this treated? Treatment for plantar fasciitis depends on how severe your condition is. Treatment may include: Rest, ice, pressure (compression), and raising (elevating) the affected foot. This is called RICE therapy. Your health care provider may recommend RICE therapy along with over-the-counter pain medicines to manage your pain. Exercises to stretch your calves and your plantar fascia. A splint that holds your foot in a stretched, upward position while you sleep (night splint). Physical therapy to relieve symptoms and prevent problems in the future. Injections of steroid medicine (cortisone) to relieve pain and inflammation. Stimulating your plantar fascia with electrical impulses (extracorporeal shock wave therapy). This is usually the last treatment option before surgery. Surgery, if other treatments have not worked after 12 months. Follow these instructions at home: Managing pain, stiffness, and swelling  If directed, put ice on the painful area. To do this: Put ice in a plastic bag, or use a frozen bottle of water. Place a towel between your skin and the bag or bottle. Roll the bottom of your foot over the bag or bottle. Do this for 20 minutes, 2-3 times a day. Wear athletic shoes that have air-sole or gel-sole cushions, or try soft shoe inserts that are designed for plantar  fasciitis. Elevate your foot above the level of your heart while you are sitting or lying down. Activity Avoid activities that cause pain. Ask your health care provider what activities are safe for you. Do physical therapy exercises and stretches as told by your  health care provider. Try activities and forms of exercise that are easier on your joints (low impact). Examples include swimming, water aerobics, and biking. General instructions Take over-the-counter and prescription medicines only as told by your health care provider. Wear a night splint while sleeping, if told by your health care provider. Loosen the splint if your toes tingle, become numb, or turn cold and blue. Maintain a healthy weight, or work with your health care provider to lose weight as needed. Keep all follow-up visits. This is important. Contact a health care provider if you have: Symptoms that do not go away with home treatment. Pain that gets worse. Pain that affects your ability to move or do daily activities. Summary Plantar fasciitis is a painful foot condition that affects the heel. It occurs when the band of tissue that connects the toes to the heel bone (plantar fascia) becomes irritated. Heel pain is the main symptom of this condition. It may get worse after exercising too much or standing still for a long time. Treatment varies, but it usually starts with rest, ice, pressure (compression), and raising (elevating) the affected foot. This is called RICE therapy. Over-the-counter medicines can also be used to manage pain. This information is not intended to replace advice given to you by your health care provider. Make sure you discuss any questions you have with your health care provider. Document Revised: 10/09/2019 Document Reviewed: 10/09/2019 Elsevier Patient Education  2024 Elsevier Inc.  Corns and Calluses Corns are small areas of thickened skin that form on the top, sides, or tip of a toe. Corns have a cone-shaped core with a point that can press on a nerve below. This causes pain. Calluses are areas of thickened skin that can form anywhere on the body, including the hands, fingers, palms, soles of the feet, and heels. Calluses are usually larger than corns. What  are the causes? Corns and calluses are caused by rubbing (friction) or pressure, such as from shoes that are too tight or do not fit properly. What increases the risk? Corns are more likely to develop in people who have misshapen toes (toe deformities), such as hammer toes. Calluses can form with friction to any area of the skin. They are more likely to develop in people who: Work with their hands. Wear shoes that fit poorly, are too tight, or are high-heeled. Have toe deformities. What are the signs or symptoms? Symptoms of a corn or callus include: A hard growth on the skin. Pain or tenderness under the skin. Redness and swelling. Increased discomfort while wearing tight-fitting shoes, if your feet are affected. If a corn or callus becomes infected, symptoms may include: Redness and swelling that gets worse. Pain. Fluid, blood, or pus draining from the corn or callus. How is this diagnosed? Corns and calluses may be diagnosed based on your symptoms, your medical history, and a physical exam. How is this treated? Treatment for corns and calluses may include: Removing the cause of the friction or pressure. This may involve: Changing your shoes. Wearing shoe inserts (orthotics) or other protective layers in your shoes, such as a corn pad. Wearing gloves. Applying medicine to the skin (topical medicine) to help soften skin in the hardened, thickened areas. Removing layers  of dead skin with a file to reduce the size of the corn or callus. Removing the corn or callus with a scalpel or laser. Taking antibiotic medicines, if your corn or callus is infected. Having surgery, if a toe deformity is the cause. Follow these instructions at home:  Take over-the-counter and prescription medicines only as told by your health care provider. If you were prescribed an antibiotic medicine, take it as told by your health care provider. Do not stop taking it even if your condition improves. Wear shoes  that fit well. Avoid wearing high-heeled shoes and shoes that are too tight or too loose. Wear any padding, protective layers, gloves, or orthotics as told by your health care provider. Soak your hands or feet. Then use a file or pumice stone to soften your corn or callus. Do this as told by your health care provider. Check your corn or callus every day for signs of infection. Contact a health care provider if: Your symptoms do not improve with treatment. You have redness or swelling that gets worse. Your corn or callus becomes painful. You have fluid, blood, or pus coming from your corn or callus. You have new symptoms. Get help right away if: You develop severe pain with redness. Summary Corns are small areas of thickened skin that form on the top, sides, or tip of a toe. These can be painful. Calluses are areas of thickened skin that can form anywhere on the body, including the hands, fingers, palms, and soles of the feet. Calluses are usually larger than corns. Corns and calluses are caused by rubbing (friction) or pressure, such as from shoes that are too tight or do not fit properly. Treatment may include wearing padding, protective layers, gloves, or orthotics as told by your health care provider. This information is not intended to replace advice given to you by your health care provider. Make sure you discuss any questions you have with your health care provider. Document Revised: 10/19/2019 Document Reviewed: 10/19/2019 Elsevier Patient Education  2024 [employer].

## 2023-02-10 NOTE — Progress Notes (Unsigned)
Established Patient Office Visit  Subjective   Patient ID: Ruth [name], female    DOB: [birth year]  Age: 47 y.o. MRN: [id number]  Chief Complaint  Patient presents with  . Foot Pain    Sharp pain at sole of foot, radiates up leg and to outer thigh. 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 Abnormal uterine bleeding 2023  . Anemia 2023  . Anxiety   . Arthritis    chronic back pain   . Asthma    rarely uses inhaler  . Bipolar 1 disorder (HCC)    Follows w/ PCP Dr. Georganna Skeans.  . Fibromyalgia   . GERD (gastroesophageal reflux disease)   . Hidradenitis    multiple boils, axilla and groin (As of 08/03/22, patient has a boil in her left axilla.)  . HLD (hyperlipidemia)    diet controlled  . Hx of migraines    occ  . Hypertension    no meds  . Hypothyroidism   . Intentional drug overdose (HCC) 03/28/2021  . Obesity   . Sleep apnea    Patient states that she had a sleep study in the past and was told she did not need a CPAP.  Marland Kitchen Thrombocytosis 01/26/2020  . Type 2 diabetes mellitus (HCC)    Type 2, Follows w/ PCP Dr. Georganna Skeans.  . Wears glasses    Social History   Socioeconomic History  . Marital status: Married    Spouse name: Not on file  . Number of children: Not on file  . Years of education: Not on file  . Highest education level: Not on file  Occupational History  . Not on file  Tobacco Use  . Smoking status: Every Day    Current packs/day: 0.25    Types: Cigarettes  . Smokeless tobacco: Never  . Tobacco comments:    Per patient, she only smokes 2 to 3 cigarettes daily as of 08/03/22.  Vaping Use  . Vaping status: Never Used  Substance and Sexual Activity  . Alcohol use: Not Currently  . Drug use: Yes    Types: Marijuana    Comment: Occasional use only  . Sexual activity: Yes     Birth control/protection: Surgical    Comment: tubal ligation  Other Topics Concern  . Not on file  Social History Narrative  . Not on file   Social Determinants of Health   Financial Resource Strain: Not on file  Food Insecurity: Not on file  Transportation Needs: Not on file  Physical Activity: Not on file  Stress: Not on file  Social Connections: Not on file  Intimate Partner Violence: Not on file   Family History  Problem Relation Age of Onset  . Thyroid disease Mother   . Diabetes Daughter   . Diabetes Son   . Breast cancer Neg Hx    Allergies  Allergen Reactions  . Metformin And Related Diarrhea  . Penicillins Other (See Comments)    Unknown; from childhood Has patient had a PCN reaction causing immediate rash, facial/tongue/throat swelling, SOB or lightheadedness with hypotension: Unknow Has patient had a PCN reaction causing severe rash involving mucus membranes or skin necrosis: Unknown Has patient had a PCN reaction that required hospitalization: Unknown Has patient had  a PCN reaction occurring within the last 10 years: Unknown If all of the above answers are "NO", then may proceed with Cephalosporin use.   Verdis Prime [Liraglutide] Itching    ROS    Objective:     Ht 5\' 6"  (1.676 m)   Wt 256 lb (116.1 kg)   LMP 07/05/2022   BMI 41.32 kg/m  BP Readings from Last 3 Encounters:  02/01/23 126/85  01/31/23 137/88  01/27/23 (!) 158/98   Wt Readings from Last 3 Encounters:  02/10/23 256 lb (116.1 kg)  02/01/23 254 lb (115.2 kg)  01/31/23 250 lb (113.4 kg)      Physical Exam     Assessment & Plan:   Problem List Items Addressed This Visit   None   No follow-ups on file.    Ruth Knudsen Mayers, PA-C

## 2023-02-11 ENCOUNTER — Encounter: Payer: Self-pay | Admitting: Physician Assistant

## 2023-02-17 ENCOUNTER — Telehealth: Payer: Self-pay | Admitting: Family Medicine

## 2023-02-17 ENCOUNTER — Other Ambulatory Visit: Payer: Self-pay

## 2023-02-17 NOTE — Telephone Encounter (Signed)
Copied from CRM [id number]. Topic: Appointment Scheduling - Scheduling Inquiry for Clinic >> Feb 17, 2023  9:49 AM Marlow Baars wrote: Reason for CRM: The patient called in wanting to schedule an appt with [facility] ASAP. She states she would like to speak with him about upping her Ozempic. Please assist patient further

## 2023-02-23 ENCOUNTER — Ambulatory Visit (INDEPENDENT_AMBULATORY_CARE_PROVIDER_SITE_OTHER): Payer: Medicaid Other | Admitting: Podiatry

## 2023-02-23 ENCOUNTER — Ambulatory Visit: Payer: Medicaid Other | Attending: Internal Medicine | Admitting: Pharmacist

## 2023-02-23 ENCOUNTER — Encounter: Payer: Self-pay | Admitting: Podiatry

## 2023-02-23 ENCOUNTER — Ambulatory Visit (INDEPENDENT_AMBULATORY_CARE_PROVIDER_SITE_OTHER): Payer: Medicaid Other

## 2023-02-23 ENCOUNTER — Encounter: Payer: Self-pay | Admitting: Pharmacist

## 2023-02-23 ENCOUNTER — Other Ambulatory Visit: Payer: Self-pay

## 2023-02-23 DIAGNOSIS — E1142 Type 2 diabetes mellitus with diabetic polyneuropathy: Secondary | ICD-10-CM

## 2023-02-23 DIAGNOSIS — Z7985 Long-term (current) use of injectable non-insulin antidiabetic drugs: Secondary | ICD-10-CM

## 2023-02-23 DIAGNOSIS — M25571 Pain in right ankle and joints of right foot: Secondary | ICD-10-CM | POA: Diagnosis not present

## 2023-02-23 DIAGNOSIS — M7662 Achilles tendinitis, left leg: Secondary | ICD-10-CM

## 2023-02-23 DIAGNOSIS — I10 Essential (primary) hypertension: Secondary | ICD-10-CM

## 2023-02-23 DIAGNOSIS — M25572 Pain in left ankle and joints of left foot: Secondary | ICD-10-CM | POA: Diagnosis not present

## 2023-02-23 DIAGNOSIS — M7661 Achilles tendinitis, right leg: Secondary | ICD-10-CM

## 2023-02-23 MED ORDER — MELOXICAM 15 MG PO TABS: 15.0000 mg | ORAL_TABLET | Freq: Every day | ORAL | 0 refills | Status: DC

## 2023-02-23 MED ORDER — SEMAGLUTIDE (2 MG/DOSE) 8 MG/3ML ~~LOC~~ SOPN
2.0000 mg | PEN_INJECTOR | SUBCUTANEOUS | 6 refills | Status: DC
  Filled 2023-02-23: qty 3, fill #0
  Filled 2023-03-21: qty 3, 28d supply, fill #0
  Filled 2023-04-12: qty 3, 28d supply, fill #1

## 2023-02-23 MED ORDER — SEMAGLUTIDE (1 MG/DOSE) 4 MG/3ML ~~LOC~~ SOPN
1.0000 mg | PEN_INJECTOR | SUBCUTANEOUS | 0 refills | Status: DC
  Filled 2023-02-23: qty 3, 28d supply, fill #0

## 2023-02-23 NOTE — Patient Instructions (Signed)

## 2023-02-23 NOTE — Progress Notes (Signed)
  Subjective:  Patient ID: Ruth [name], female    DOB: 02/01/1976,   MRN: [id number]  Chief Complaint  Patient presents with   Callouses    new pt-callus on R foot in middle of foot near arch-non req-Cari Mayers, PA refer    Foot Pain    B/ l back of ankles are hurting , hard to walk sometimes    47 y.o. female presents for concern of right foot pain that started several weeks ago. 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 She does have a callus that is painful when she steps on it but has had pain with first steps in the morning and pain going up her leg  She has been stretching and taking ibuprofen and tried different shoes and has been alternating a boot on her feet. Denies any other pedal complaints. Denies n/v/f/c.   Past Medical History:  Diagnosis Date   Abnormal uterine bleeding 2023   Anemia 2023   Anxiety    Arthritis    chronic back pain    Asthma    rarely uses inhaler   Bipolar 1 disorder (HCC)    Follows w/ PCP Dr. Georganna Skeans.   Fibromyalgia    GERD (gastroesophageal reflux disease)    Hidradenitis    multiple boils, axilla and groin (As of 08/03/22, patient has a boil in her left axilla.)   HLD (hyperlipidemia)    diet controlled   Hx of migraines    occ   Hypertension    no meds   Hypothyroidism    Intentional drug overdose (HCC) 03/28/2021   Obesity    Sleep apnea    Patient states that she had a sleep study in the past and was told she did not need a CPAP.   Thrombocytosis 01/26/2020   Type 2 diabetes mellitus (HCC)    Type 2, Follows w/ PCP Dr. Georganna Skeans.   Wears glasses     Objective:  Physical Exam: Vascular: DP/PT pulses 2/4 bilateral. CFT <3 seconds. Normal hair growth on digits. No edema.  Skin. No lacerations or abrasions bilateral feet.  Musculoskeletal: MMT 5/5 bilateral lower extremities in DF, PF, Inversion and Eversion. Deceased ROM in DF of ankle joint. Most tender to proximal achilles tendon bilateral in  watershed area. Some pain to insertion site bilateral. Some pain along PT tendon and medial calcaneal tubercle bilateral  Neurological: Sensation intact to light touch.   Assessment:   1. Achilles tendonitis, bilateral      Plan:  Patient was evaluated and treated and all questions answered. -Xrays reviewed. Spurring noted to inferior and posterior calcaneus bilateral.  -Discussed Achilles insertional tendonitis and treatment options with patient.  -Discussed stretching exercises. -Rx Meloxicam provided  -Heel lifts provided and discussed proper shoewear.  -Discussed if no improvement will consider MRI/PT/EPAT/PRP injections.  -Patient to return to office  in 6 weeks for recheck.    Louann Sjogren, DPM

## 2023-02-23 NOTE — Progress Notes (Signed)
.     S:    I connected with  Ruth [name] on 02/23/23 by a video enabled telemedicine application and verified that I am speaking with the correct person using two identifiers.   I discussed the limitations of evaluation and management by telemedicine. The patient expressed understanding and agreed to proceed.  Ruth [name] is a 47 y.o. female who presents for diabetes evaluation, education, and management. PMH is significant for T2DM and HLD. Patient was referred and last seen by Primary Care Provider, Dr. Andrey Campanile, on 02/01/2023.  Today, patient is in good spirits She is doing okay since adding Ozempic. No side effects, however, she believes her weight loss has now plateaued with the medication. She continues with 0.5 mg weekly and has never tried the 1mg  or 2mg  doses.   Family/Social History:  DM- daughter, brother, sister  No family or personal history of thyroid cancer (mother has history of hyperthyroidism requiring radiation).   Current diabetes medications include: Ozempic 0.5 mg weekly  Patient reports adherence to all medications.  Insurance coverage: Medicaid, UHC  Patient denies polyuria. Patient denies neuropathy (nerve pain). Patient reports visual changes. Patient reports self foot exams.   Dietary habits:  -Appetite has decreased since addition of Ozempic.  -Has increased her water intake since last visit. Still will drink regular soda occasionally.  -Limits bread, pasta, potatoes.   Exercise:  -None  O:  Lab Results  Component Value Date   HGBA1C 6.7 (A) 11/11/2022   Lipid Panel     Component Value Date/Time   CHOL 227 (H) 03/13/2022 1020   TRIG 106 03/13/2022 1020   HDL 48 03/13/2022 1020   CHOLHDL 4.7 (H) 03/13/2022 1020   LDLCALC 160 (H) 03/13/2022 1020    Clinical Atherosclerotic Cardiovascular Disease (ASCVD): No  The 10-year ASCVD risk score (Arnett DK, et al., 2019) is: 10.2%   Values used to calculate the score:      Age: 21 years     Sex: Female     Is Non-Hispanic African American: Yes     Diabetic: Yes     Tobacco smoker: Yes     Systolic Blood Pressure: 126 mmHg     Is BP treated: No     HDL Cholesterol: 48 mg/dL     Total Cholesterol: 227 mg/dL   Patient is participating in a Managed Medicaid Plan:  Yes   A/P: Diabetes longstanding at goal based on A1c. Patient is able to verbalize appropriate hypoglycemia management plan. She is tolerating the Ozempic but desires additional weight loss. Will uptitrate dose before changing therapy.  -Increase Ozempic (semaglutide) to 1 mg weekly x4 weeks. Then, increase to 2 mg weekly thereafter.  -Patient educated on purpose, proper use, and potential adverse effects of Ozempic.  -Extensively discussed pathophysiology of diabetes, recommended lifestyle interventions, dietary effects on blood sugar control.  -Counseled on s/sx of and management of hypoglycemia.  -Next A1c anticipated 04/27/23 during appt with me.   Follow-up:  With me in October.  PCP clinic visit 05/14/2023.  Butch Penny, PharmD, Patsy Baltimore, CPP Clinical Pharmacist [facility] [phone]

## 2023-03-11 ENCOUNTER — Other Ambulatory Visit: Payer: Self-pay

## 2023-03-22 ENCOUNTER — Other Ambulatory Visit: Payer: Self-pay

## 2023-03-23 ENCOUNTER — Other Ambulatory Visit: Payer: Self-pay

## 2023-03-29 ENCOUNTER — Other Ambulatory Visit: Payer: Self-pay

## 2023-04-06 ENCOUNTER — Ambulatory Visit: Payer: Medicaid Other | Admitting: Podiatry

## 2023-04-06 ENCOUNTER — Encounter: Payer: Self-pay | Admitting: Podiatry

## 2023-04-06 DIAGNOSIS — M7661 Achilles tendinitis, right leg: Secondary | ICD-10-CM | POA: Diagnosis not present

## 2023-04-06 DIAGNOSIS — M7662 Achilles tendinitis, left leg: Secondary | ICD-10-CM

## 2023-04-06 NOTE — Progress Notes (Signed)
Subjective:  Patient ID: Ruth [name], female    DOB: 1975-09-08,   MRN: [id number]  No chief complaint on file.   47 y.o. female presents for follow-up of bilateral achilles tendonitis. Relates no improvement in pain and actually a little worse. Relates left side has been more painful. Relates she has been stretching and not finding any shoes comfortable. It has been difficult to work. Medications did not help.  Denies any other pedal complaints. Denies n/v/f/c.   Past Medical History:  Diagnosis Date   Abnormal uterine bleeding 2023   Anemia 2023   Anxiety    Arthritis    chronic back pain    Asthma    rarely uses inhaler   Bipolar 1 disorder (HCC)    Follows w/ PCP Dr. Georganna Skeans.   Fibromyalgia    GERD (gastroesophageal reflux disease)    Hidradenitis    multiple boils, axilla and groin (As of 08/03/22, patient has a boil in her left axilla.)   HLD (hyperlipidemia)    diet controlled   Hx of migraines    occ   Hypertension    no meds   Hypothyroidism    Intentional drug overdose (HCC) 03/28/2021   Obesity    Sleep apnea    Patient states that she had a sleep study in the past and was told she did not need a CPAP.   Thrombocytosis 01/26/2020   Type 2 diabetes mellitus (HCC)    Type 2, Follows w/ PCP Dr. Georganna Skeans.   Wears glasses     Objective:  Physical Exam: Vascular: DP/PT pulses 2/4 bilateral. CFT <3 seconds. Normal hair growth on digits. No edema.  Skin. No lacerations or abrasions bilateral feet.  Musculoskeletal: MMT 5/5 bilateral lower extremities in DF, PF, Inversion and Eversion. Deceased ROM in DF of ankle joint. Most tender to proximal achilles tendon bilateral in watershed area. Some pain to insertion site bilateral. Some pain along PT tendon and medial calcaneal tubercle bilateral  Neurological: Sensation intact to light touch.   Assessment:   1. Achilles tendonitis, bilateral      Plan:  Patient was evaluated and treated and  all questions answered. -Xrays reviewed. Spurring noted to inferior and posterior calcaneus bilateral.  -Discussed Achilles insertional tendonitis and treatment options with patient.  -Continue stretching  -Amb reft to PT  -CAM boot provided to offload area.  -MRI bilateral. ordered to further evaluated and consider surgical planning.  -Discussed if no improvement will consider surgery/EPAT/PRP injections.  -Patient to return to office  after MRI and PT.    Louann Sjogren, DPM

## 2023-04-12 ENCOUNTER — Other Ambulatory Visit: Payer: Self-pay

## 2023-04-23 ENCOUNTER — Other Ambulatory Visit: Payer: Self-pay

## 2023-04-23 ENCOUNTER — Ambulatory Visit: Payer: Medicaid Other

## 2023-04-23 ENCOUNTER — Ambulatory Visit: Payer: Medicaid Other | Attending: Podiatry

## 2023-04-23 DIAGNOSIS — M25572 Pain in left ankle and joints of left foot: Secondary | ICD-10-CM | POA: Diagnosis present

## 2023-04-23 DIAGNOSIS — R262 Difficulty in walking, not elsewhere classified: Secondary | ICD-10-CM | POA: Insufficient documentation

## 2023-04-23 DIAGNOSIS — M7662 Achilles tendinitis, left leg: Secondary | ICD-10-CM | POA: Insufficient documentation

## 2023-04-23 DIAGNOSIS — M25571 Pain in right ankle and joints of right foot: Secondary | ICD-10-CM | POA: Diagnosis present

## 2023-04-23 DIAGNOSIS — M7661 Achilles tendinitis, right leg: Secondary | ICD-10-CM | POA: Diagnosis not present

## 2023-04-23 NOTE — Therapy (Signed)
OUTPATIENT PHYSICAL THERAPY LOWER EXTREMITY EVALUATION   Patient Name: Ruth [name] MRN: [id number] DOB:[birth year], 47 y.o., female Today's Date: 04/23/2023  END OF SESSION:   Past Medical History:  Diagnosis Date   Abnormal uterine bleeding 2023   Anemia 2023   Anxiety    Arthritis    chronic back pain    Asthma    rarely uses inhaler   Bipolar 1 disorder (HCC)    Follows w/ PCP Dr. Georganna Skeans.   Fibromyalgia    GERD (gastroesophageal reflux disease)    Hidradenitis    multiple boils, axilla and groin (As of 08/03/22, patient has a boil in her left axilla.)   HLD (hyperlipidemia)    diet controlled   Hx of migraines    occ   Hypertension    no meds   Hypothyroidism    Intentional drug overdose (HCC) 03/28/2021   Obesity    Sleep apnea    Patient states that she had a sleep study in the past and was told she did not need a CPAP.   Thrombocytosis 01/26/2020   Type 2 diabetes mellitus (HCC)    Type 2, Follows w/ PCP Dr. Georganna Skeans.   Wears glasses    Past Surgical History:  Procedure Laterality Date   ANTERIOR CERVICAL DECOMP/DISCECTOMY FUSION N/A 01/16/2021   Procedure: Cervical five-six Anterior cervical decompression/discectomy/fusion;  Surgeon: Lisbeth Renshaw, MD;  Location: [facility] OR;  Service: Neurosurgery;  Laterality: N/A;   CARPAL TUNNEL RELEASE Right    around 2009   CESAREAN SECTION     x2, 2003 & 2008   CYSTOSCOPY N/A 08/12/2022   Procedure: CYSTOSCOPY;  Surgeon: Lorriane Shire, MD;  Location: [facility];  Service: Gynecology;  Laterality: N/A;   DILITATION & CURRETTAGE/HYSTROSCOPY WITH NOVASURE ABLATION N/A 12/07/2017   Procedure: DILATATION & CURETTAGE/HYSTEROSCOPY WITH ATTEMPED NOVASURE ABLATION, FAILED/ENDOMETERIAL ABLATION WITH HTA SYSTEM;  Surgeon: Willodean Rosenthal, MD;  Location: [facility] ORS;  Service: Gynecology;  Laterality: N/A;   ELBOW SURGERY     "surgery for nerve pain" around 2009   ENDOMETRIAL  BIOPSY  07/23/2022   negative for hyperplasia or malignancy   GANGLION CYST EXCISION Right    right arm    RECTOVAGINAL FISTULA CLOSURE     in 4098,1191, 2006   ROBOTIC ASSISTED TOTAL HYSTERECTOMY Bilateral 08/12/2022   Procedure: XI ROBOTIC ASSISTED TOTAL HYSTERECTOMY WITH SALPINGECTOMY;  Surgeon: Lorriane Shire, MD;  Location: [facility];  Service: Gynecology;  Laterality: Bilateral;   TONSILLECTOMY     childhood   TUBAL LIGATION  2008   XI ROBOT ASSISTED RECTOPEXY N/A 08/12/2022   Procedure: XI robot repair serosal repair of rectum;  Surgeon: Karie Soda, MD;  Location: [facility] [place];  Service: General;  Laterality: N/A;   Patient Active Problem List   Diagnosis Date Noted   Stenosis of cervical spine with myelopathy (HCC) 01/16/2021   BMI 45.0-49.9, adult (HCC) 09/04/2020   Right arm pain 02/14/2020   Hyperlipidemia associated with type 2 diabetes mellitus (HCC) 09/19/2019   Abnormal uterine bleeding (AUB) 12/07/2017   Abnormal mammogram of both breasts 11/02/2016   Hypothyroidism 06/21/2009   Type 2 diabetes mellitus with peripheral neuropathy (HCC) 06/21/2009   BIPOLAR AFFECTIVE DISORDER 06/21/2009   PERSISTENT DISORDER INITIATING/MAINTAINING SLEEP 06/21/2009   INADEQUATE SLEEP HYGIENE 06/21/2009   OBSTRUCTIVE SLEEP APNEA 06/21/2009   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   CARPAL TUNNEL SYNDROME 06/20/2009   MERALGIA PARESTHETICA 06/20/2009   Essential hypertension 06/20/2009   FIBROMYALGIA 06/20/2009  Headache 06/20/2009    PCP: Georganna Skeans, MD  REFERRING PROVIDER: Louann Sjogren, DPM   REFERRING DIAG: [phone] (ICD-10-CM) - Achilles tendonitis, bilateral   THERAPY DIAG:  No diagnosis found.  Rationale for Evaluation and Treatment: Rehabilitation  ONSET DATE: ***  SUBJECTIVE:   SUBJECTIVE STATEMENT: *** Patient reports to PT with persistent BIL achilles pain that has been present for many years, but started to get worse  sometime this year after losing a lot of weight. She is ambulating with CAM boot that she has been utilizing for 1-2 weeks prior to today's evaluation.   47 y.o. female presents for follow-up of bilateral achilles tendonitis. Relates no improvement in pain and actually a little worse. Relates left side has been more painful. Relates she has been stretching and not finding any shoes comfortable. It has been difficult to work. Medications did not help.  Denies any other pedal complaints. Denies n/v/f/c.   Discussed Achilles insertional tendonitis and treatment options with patient.  -Continue stretching  -Amb reft to PT  -CAM boot provided to offload area.  -MRI bilateral. ordered to further evaluated and consider surgical planning.  -Discussed if no improvement will consider surgery/EPAT/PRP injections.     PERTINENT HISTORY: ***  PAIN:  Are you having pain? Yes: NPRS scale: 2-10/10 Pain location: L>R posterior, plantar bil ankle/foot Pain description: throbbing, stabbing  Aggravating factors: Wearing sneaker, seated after prolonged activity  Relieving factors: none reported, heel raises, exercises before I get out of bed  PRECAUTIONS: None  RED FLAGS: None   WEIGHT BEARING RESTRICTIONS: Yes wearing CAM boot  FALLS:  Has patient fallen in last 6 months? No  LIVING ENVIRONMENT: Lives with: lives with their family Lives in: House/apartment Stairs: Yes: Internal: 13 steps; on right going up and External: 1 steps; none Has following equipment at home: None  OCCUPATION: Cashier   PLOF: Independent  PATIENT GOALS: ***  NEXT MD VISIT: ***  OBJECTIVE:  Note: Objective measures were completed at Evaluation unless otherwise noted.  DIAGNOSTIC FINDINGS: ***  PATIENT SURVEYS:  {rehab surveys:24030}  COGNITION: Overall cognitive status: {cognition:24006}     SENSATION: {sensation:27233}  EDEMA:  {edema:24020}  MUSCLE LENGTH: Hamstrings: Right *** deg; Left ***  deg Maisie Fus test: Right *** deg; Left *** deg  POSTURE: {posture:25561}  PALPATION: ***  LOWER EXTREMITY ROM:  Active ROM Right eval Left eval  Hip flexion    Hip extension    Hip abduction    Hip adduction    Hip internal rotation    Hip external rotation    Knee flexion    Knee extension    Ankle dorsiflexion    Ankle plantarflexion    Ankle inversion    Ankle eversion     (Blank rows = not tested)  LOWER EXTREMITY MMT:  MMT Right eval Left eval  Hip flexion    Hip extension    Hip abduction    Hip adduction    Hip internal rotation    Hip external rotation    Knee flexion    Knee extension    Ankle dorsiflexion    Ankle plantarflexion    Ankle inversion    Ankle eversion     (Blank rows = not tested)  LOWER EXTREMITY SPECIAL TESTS:  {LEspecialtests:26242}  FUNCTIONAL TESTS:  {Functional tests:24029}  GAIT: Distance walked: *** Assistive device utilized: {Assistive devices:23999} Level of assistance: {Levels of assistance:24026} Comments: ***   TODAY'S TREATMENT:  DATE: ***    PATIENT EDUCATION:  Education details: *** Person educated: {Person educated:25204} Education method: {Education Method:25205} Education comprehension: {Education Comprehension:25206}  HOME EXERCISE PROGRAM: ***  ASSESSMENT:  CLINICAL IMPRESSION: Patient is a *** y.o. *** who was seen today for physical therapy evaluation and treatment for ***.   OBJECTIVE IMPAIRMENTS: {opptimpairments:25111}.   ACTIVITY LIMITATIONS: {activitylimitations:27494}  PARTICIPATION LIMITATIONS: {participationrestrictions:25113}  PERSONAL FACTORS: {Personal factors:25162} are also affecting patient's functional outcome.   REHAB POTENTIAL: {rehabpotential:25112}  CLINICAL DECISION MAKING: {clinical decision making:25114}  EVALUATION COMPLEXITY: {Evaluation  complexity:25115}   GOALS: Goals reviewed with patient? {yes/no:20286}  SHORT TERM GOALS: Target date: *** *** Baseline: Goal status: INITIAL  2.  *** Baseline:  Goal status: INITIAL  3.  *** Baseline:  Goal status: INITIAL  4.  *** Baseline:  Goal status: INITIAL  5.  *** Baseline:  Goal status: INITIAL  6.  *** Baseline:  Goal status: INITIAL  LONG TERM GOALS: Target date: ***  *** Baseline:  Goal status: INITIAL  2.  *** Baseline:  Goal status: INITIAL  3.  *** Baseline:  Goal status: INITIAL  4.  *** Baseline:  Goal status: INITIAL  5.  *** Baseline:  Goal status: INITIAL  6.  *** Baseline:  Goal status: INITIAL   PLAN:  PT FREQUENCY: {rehab frequency:25116}  PT DURATION: {rehab duration:25117}  PLANNED INTERVENTIONS: {rehab planned interventions:25118::"97110-Therapeutic exercises","97530- Therapeutic [phone]- Neuromuscular re-education","97535- Self GEXB","28413- Manual therapy"}  PLAN FOR NEXT SESSION: Mauri Reading, PT, DPT   04/23/2023, 9:15 AM

## 2023-04-27 ENCOUNTER — Other Ambulatory Visit: Payer: Self-pay

## 2023-04-27 ENCOUNTER — Encounter: Payer: Self-pay | Admitting: Family Medicine

## 2023-04-27 ENCOUNTER — Ambulatory Visit: Payer: Medicaid Other | Attending: Family Medicine | Admitting: Pharmacist

## 2023-04-27 ENCOUNTER — Telehealth: Payer: Self-pay | Admitting: Pharmacist

## 2023-04-27 ENCOUNTER — Encounter: Payer: Self-pay | Admitting: Pharmacist

## 2023-04-27 VITALS — Wt 245.0 lb

## 2023-04-27 DIAGNOSIS — Z7985 Long-term (current) use of injectable non-insulin antidiabetic drugs: Secondary | ICD-10-CM | POA: Diagnosis not present

## 2023-04-27 DIAGNOSIS — E1142 Type 2 diabetes mellitus with diabetic polyneuropathy: Secondary | ICD-10-CM | POA: Diagnosis not present

## 2023-04-27 LAB — POCT GLYCOSYLATED HEMOGLOBIN (HGB A1C): HbA1c, POC (controlled diabetic range): 6.2 % (ref 0.0–7.0)

## 2023-04-27 MED ORDER — TIRZEPATIDE 2.5 MG/0.5ML ~~LOC~~ SOAJ
2.5000 mg | SUBCUTANEOUS | 1 refills | Status: DC
  Filled 2023-04-27: qty 2, 28d supply, fill #0
  Filled 2023-05-24: qty 2, 28d supply, fill #1

## 2023-04-27 NOTE — Progress Notes (Signed)
.     S:    Ruth [name] is a 47 y.o. female who presents for diabetes evaluation, education, and management. PMH is significant for T2DM and HLD. Patient was referred and last seen by Primary Care Provider, Dr. Andrey Campanile, on 02/01/2023. I last saw her in August and increased her Ozempic to assist in her weight loss goal.   Today, patient is in good spirits. She is doing okay since increasing Ozempic. However, she has developed some nausea and occasional vomiting with the 2mg  weekly dose. She notes that this occurs 1-2x weekly, specifically if she eats later in the evening. She has already tried and failed Victoza (pruritus).   Dates on Ozempic:  -03/16/2022 - present  Dates on Victoza:  -07/2018 - 09/16/2018  Family/Social History:  DM- daughter, brother, sister  No family or personal history of thyroid cancer (mother has history of hyperthyroidism requiring radiation).   Current diabetes medications include: Ozempic 2 mg weekly  Patient reports adherence to all medications.  Insurance coverage: Medicaid, [employer]  Patient denies polyuria. Patient denies neuropathy (nerve pain). Patient reports visual changes. Patient reports self foot exams.   Dietary habits:  -Appetite has decreased since addition of Ozempic.  -Has increased her water intake since last visit. Still will drink regular soda occasionally.  -Limits bread, pasta, potatoes.   Exercise:  -None  O:  Lab Results  Component Value Date   HGBA1C 6.2 04/27/2023   Lipid Panel     Component Value Date/Time   CHOL 227 (H) 03/13/2022 1020   TRIG 106 03/13/2022 1020   HDL 48 03/13/2022 1020   CHOLHDL 4.7 (H) 03/13/2022 1020   LDLCALC 160 (H) 03/13/2022 1020    Clinical Atherosclerotic Cardiovascular Disease (ASCVD): No  The 10-year ASCVD risk score (Arnett DK, et al., 2019) is: 10.2%   Values used to calculate the score:     Age: 23 years     Sex: Female     Is Non-Hispanic African American: Yes     Diabetic:  Yes     Tobacco smoker: Yes     Systolic Blood Pressure: 126 mmHg     Is BP treated: No     HDL Cholesterol: 48 mg/dL     Total Cholesterol: 227 mg/dL   Patient is participating in a Managed Medicaid Plan:  Yes   A/P: Diabetes longstanding at goal based on A1c. Patient is able to verbalize appropriate hypoglycemia management plan. Even though effective in achieving her A1c and weight loss goals, Ozempic has caused her to develop GI side effects. Will try to get PA approval to and switch to [facility]. If this is not approved, she tolerated the Ozempic 1mg  weekly dose and we can change back to that medication.  -Stop Ozempic due to GI side effects.  -Start Mounjaro 2.5 mg weekly. If intolerable or not covered, we can go back to Ozempic 1 mg weekly.  -Patient educated on purpose, proper use, and potential adverse effects of Ozempic.  -Extensively discussed pathophysiology of diabetes, recommended lifestyle interventions, dietary effects on blood sugar control.  -Counseled on s/sx of and management of hypoglycemia.  -Next A1c anticipated 07/2023  Follow-up:  With me in November.   Butch Penny, PharmD, P[place]y [place], CPP Clinical Pharmacist [facility] [phone]

## 2023-04-27 NOTE — Telephone Encounter (Signed)
Can we attempt a PA for this patient for [facility]? Has now tried and failed Ozempic and Victoza. I have listed the dates tried in my note from today's OV.

## 2023-04-28 ENCOUNTER — Ambulatory Visit
Admission: RE | Admit: 2023-04-28 | Discharge: 2023-04-28 | Disposition: A | Discharge status: Home / Self Care | Payer: Medicaid Other | Source: Ambulatory Visit | Attending: Podiatry | Admitting: Podiatry

## 2023-04-28 ENCOUNTER — Telehealth: Payer: Self-pay

## 2023-04-28 ENCOUNTER — Other Ambulatory Visit: Payer: Self-pay

## 2023-04-28 ENCOUNTER — Ambulatory Visit
Admission: RE | Admit: 2023-04-28 | Discharge: 2023-04-28 | Disposition: A | Discharge status: Home / Self Care | Payer: Medicaid Other | Source: Ambulatory Visit | Attending: Podiatry

## 2023-04-28 DIAGNOSIS — M7661 Achilles tendinitis, right leg: Secondary | ICD-10-CM

## 2023-04-28 NOTE — Telephone Encounter (Signed)
Pharmacy Patient Advocate Encounter  Received notification from [facility] that Prior Authorization for [facility] has been APPROVED from 04/28/2023 to 04/27/2024   PA #/Case ID/Reference #: [id number]

## 2023-05-07 ENCOUNTER — Ambulatory Visit: Payer: Medicaid Other | Attending: Podiatry

## 2023-05-07 DIAGNOSIS — M25571 Pain in right ankle and joints of right foot: Secondary | ICD-10-CM | POA: Diagnosis present

## 2023-05-07 DIAGNOSIS — R262 Difficulty in walking, not elsewhere classified: Secondary | ICD-10-CM | POA: Diagnosis present

## 2023-05-07 DIAGNOSIS — M25572 Pain in left ankle and joints of left foot: Secondary | ICD-10-CM | POA: Diagnosis present

## 2023-05-11 ENCOUNTER — Telehealth: Payer: Self-pay | Admitting: Podiatry

## 2023-05-11 ENCOUNTER — Other Ambulatory Visit: Payer: Self-pay

## 2023-05-11 ENCOUNTER — Other Ambulatory Visit: Payer: Self-pay | Admitting: Family Medicine

## 2023-05-11 DIAGNOSIS — E1169 Type 2 diabetes mellitus with other specified complication: Secondary | ICD-10-CM

## 2023-05-11 MED ORDER — DEXCOM G7 SENSOR MISC
2 refills | Status: DC
Start: 2023-05-11 — End: 2023-09-03
  Filled 2023-05-11: qty 3, 30d supply, fill #0
  Filled 2023-06-20: qty 3, 30d supply, fill #1
  Filled 2023-07-17: qty 3, 30d supply, fill #2

## 2023-05-11 NOTE — Telephone Encounter (Signed)
Pt called checking on an appt for mri results. She had it about 2 wks ago. I scheduled her to see you on 11/18 and told her if results were not in we would call her back to r/s

## 2023-05-13 ENCOUNTER — Other Ambulatory Visit: Payer: Self-pay

## 2023-05-13 ENCOUNTER — Encounter: Payer: Self-pay | Admitting: *Deleted

## 2023-05-13 ENCOUNTER — Telehealth: Payer: Self-pay

## 2023-05-13 NOTE — Therapy (Signed)
OUTPATIENT PHYSICAL THERAPY NOTE    Patient Name: Ruth [name] MRN: [id number] DOB:Jul 03, 1976, 47 y.o., female Today's Date:  05/14/2023   END OF SESSION:  PT End of Session - 05/14/23 0835     Visit Number 3    Number of Visits 9    Date for PT Re-Evaluation 06/19/23    Authorization Type [employer]    Authorization - Visit Number 3    Authorization - Number of Visits 27    Progress Note Due on Visit 9    PT Start Time 0831    PT Stop Time 0915    PT Time Calculation (min) 44 min    Activity Tolerance Patient tolerated treatment well    Behavior During Therapy [facility] for tasks assessed/performed               Past Medical History:  Diagnosis Date   Abnormal uterine bleeding 2023   Anemia 2023   Anxiety    Arthritis    chronic back pain    Asthma    rarely uses inhaler   Bipolar 1 disorder (HCC)    Follows w/ PCP Dr. Georganna Skeans.   Fibromyalgia    GERD (gastroesophageal reflux disease)    Hidradenitis    multiple boils, axilla and groin (As of 08/03/22, patient has a boil in her left axilla.)   HLD (hyperlipidemia)    diet controlled   Hx of migraines    occ   Hypertension    no meds   Hypothyroidism    Intentional drug overdose (HCC) 03/28/2021   Obesity    Sleep apnea    Patient states that she had a sleep study in the past and was told she did not need a CPAP.   Thrombocytosis 01/26/2020   Type 2 diabetes mellitus (HCC)    Type 2, Follows w/ PCP Dr. Georganna Skeans.   Wears glasses    Past Surgical History:  Procedure Laterality Date   ANTERIOR CERVICAL DECOMP/DISCECTOMY FUSION N/A 01/16/2021   Procedure: Cervical five-six Anterior cervical decompression/discectomy/fusion;  Surgeon: Lisbeth Renshaw, MD;  Location: [facility] OR;  Service: Neurosurgery;  Laterality: N/A;   CARPAL TUNNEL RELEASE Right    around 2009   CESAREAN SECTION     x2, 2003 & 2008   CYSTOSCOPY N/A 08/12/2022   Procedure: CYSTOSCOPY;  Surgeon: Lorriane Shire, MD;   Location: [facility];  Service: Gynecology;  Laterality: N/A;   DILITATION & CURRETTAGE/HYSTROSCOPY WITH NOVASURE ABLATION N/A 12/07/2017   Procedure: DILATATION & CURETTAGE/HYSTEROSCOPY WITH ATTEMPED NOVASURE ABLATION, FAILED/ENDOMETERIAL ABLATION WITH HTA SYSTEM;  Surgeon: Willodean Rosenthal, MD;  Location: [facility];  Service: Gynecology;  Laterality: N/A;   ELBOW SURGERY     "surgery for nerve pain" around 2009   ENDOMETRIAL BIOPSY  07/23/2022   negative for hyperplasia or malignancy   GANGLION CYST EXCISION Right    right arm    RECTOVAGINAL FISTULA CLOSURE     in [id number], 2006   ROBOTIC ASSISTED TOTAL HYSTERECTOMY Bilateral 08/12/2022   Procedure: XI ROBOTIC ASSISTED TOTAL HYSTERECTOMY WITH SALPINGECTOMY;  Surgeon: Lorriane Shire, MD;  Location: [facility];  Service: Gynecology;  Laterality: Bilateral;   TONSILLECTOMY     childhood   TUBAL LIGATION  2008   XI ROBOT ASSISTED RECTOPEXY N/A 08/12/2022   Procedure: XI robot repair serosal repair of rectum;  Surgeon: Karie Soda, MD;  Location: [name];  Service: General;  Laterality: N/A;   Patient Active Problem List  Diagnosis Date Noted   Stenosis of cervical spine with myelopathy (HCC) 01/16/2021   BMI 45.0-49.9, adult (HCC) 09/04/2020   Right arm pain 02/14/2020   Hyperlipidemia associated with type 2 diabetes mellitus (HCC) 09/19/2019   Abnormal uterine bleeding (AUB) 12/07/2017   Abnormal mammogram of both breasts 11/02/2016   Hypothyroidism 06/21/2009   Type 2 diabetes mellitus with peripheral neuropathy (HCC) 06/21/2009   BIPOLAR AFFECTIVE DISORDER 06/21/2009   PERSISTENT DISORDER INITIATING/MAINTAINING SLEEP 06/21/2009   INADEQUATE SLEEP HYGIENE 06/21/2009   OBSTRUCTIVE SLEEP APNEA 06/21/2009   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   CARPAL TUNNEL SYNDROME 06/20/2009   MERALGIA PARESTHETICA 06/20/2009   Essential hypertension 06/20/2009   FIBROMYALGIA 06/20/2009    Headache 06/20/2009    PCP: Georganna Skeans, MD  REFERRING PROVIDER: Louann Sjogren, DPM   REFERRING DIAG: [phone] (ICD-10-CM) - Achilles tendonitis, bilateral   THERAPY DIAG:  Pain in left ankle and joints of left foot  Pain in right ankle and joints of right foot  Difficulty in walking, not elsewhere classified  Rationale for Evaluation and Treatment: Rehabilitation  ONSET DATE: 04/06/2023 date of referral  SUBJECTIVE:   SUBJECTIVE STATEMENT: 05/14/2023: Patient states that she is hurting really badly today. She states "I think I did too much at work this week. I did not wear the boot because there are some areas where it could get slippery and there were only 2 of [place] working.   Eval: Patient reports to PT with persistent BIL achilles pain that has been present for many years, but started to get worse sometime this year after losing a lot of weight. She is ambulating with CAM boot that she has been utilizing for 1-2 weeks prior to today's evaluation.  She endorses that left side is more painful/limiting than right.   PERTINENT HISTORY: Relevant past medical history includes anxiety, arthritis, bipolar disorder, fibromyalgia, hypertension, obesity, type 2 diabetes, cervical spine stenosis with myelopathy, hypothyroidism, OSA  PAIN:  Are you having pain? Yes: NPRS scale: 2-10/10 Pain location: L>R posterior, plantar bil ankle/foot Pain description: throbbing, stabbing  Aggravating factors: Wearing sneaker, seated after prolonged activity  Relieving factors: none reported, heel raises, exercises before I get out of bed  PRECAUTIONS: None  RED FLAGS: None   WEIGHT BEARING RESTRICTIONS: Yes wearing CAM boot  FALLS:  Has patient fallen in last 6 months? No  LIVING ENVIRONMENT: Lives with: lives with their family Lives in: House/apartment Stairs: Yes: Internal: 13 steps; on right going up and External: 1 steps; none Has following equipment at home:  None  OCCUPATION: Cashier   PLOF: Independent  PATIENT GOALS: Patient would like to have less pain with standing, walking, wearing her sneakers  NEXT MD VISIT: 04/27/23  OBJECTIVE:  Note: Objective measures were completed at Evaluation unless otherwise noted.  DIAGNOSTIC FINDINGS:   Scheduled to receive bilateral heel MRI on 05/05/2023  BIL Ankle radiographs taken 02/23/2023  Per office note: "Xrays reviewed. Spurring noted to inferior and posterior calcaneus bilateral."     PATIENT SURVEYS:  FOTO to be completed at follow-up  COGNITION: Overall cognitive status: Within functional limits for tasks assessed     SENSATION: Not tested  EDEMA:  No notable findings  PALPATION: Some tenderness noted at distal attachment of Achilles tendon, distal gastroc/soleus, along calcaneal insertion of plantar fascia  LOWER EXTREMITY ROM:  Active ROM Right eval Left eval  Hip flexion    Hip extension    Hip abduction    Hip adduction    Hip internal rotation  Hip external rotation    Knee flexion    Knee extension    Ankle dorsiflexion 10 8  Ankle plantarflexion WNL WNL  Ankle inversion WNL WNL  Ankle eversion WNL WNL   (Blank rows = not tested)  LOWER EXTREMITY MMT:  MMT Right eval Left eval  Ankle dorsiflexion 4+ 4  Ankle plantarflexion 4 4-  Ankle inversion 4 4  Ankle eversion 4 4   (Blank rows = not tested)   GAIT: Distance walked: 100 ft  Assistive device utilized:  Left cam boot and None Level of assistance: Complete Independence Comments: Gait mechanics altered due to the cam boot, to be formally assist with after removal of cam boot  Gait assessed 05/07/23 without shoes or cam boot: Patient ambulates with excessive supination bilaterally, and is unable to maintain neutral position > 3 consecutive steps  TODAY'S TREATMENT:       [facility] Adult PT Treatment:                                                DATE: 05/14/2023  Therapeutic Exercise: NuStep x  5 minutes, level 4  Seated Ankle Pumps, x 15 Seated Ankle Alphabet x 1 round  Marble Pickups x 1 round on each LE  Inversion/Eversion towel slides  Plantar Fascia stretch seated, 3x 30 sec    Manual Therapy:  Soft Tissue Mobilization and IASTYM to L achilles tendon and Lt plantar fascia, laterally   Modalities: (unbilled)  Cold Pack applied to achilles and proximal plantarfascia at end of session                                                                                             [facility] Adult PT Treatment:                                                DATE: 05/07/2023  Therapeutic Exercise: NuStep x 5 minutes, level 4  Seated Ankle Pumps, 2 x 10  Seated Ankle Alphabet x 1 round  Towel Scrunches, 2 x 30 sec Marble Pickups x 1 round on each LE  Inversion/Eversion towel slides  Plantar Fascia stretch seated and standing  Created and reviewed initial HEP   Therapeutic Activity: Gait Assessment as noted above  Patient report outcome measure and review  [facility] Adult PT Treatment:                                                DATE: 04/24/2023   Initial evaluation: see patient education and home exercise program as noted below    Manual Therapy x 8 min:  STM to L achilles and plantarfascia   PATIENT EDUCATION:  Education details: reviewed initial home exercise program; discussion of POC, prognosis and goals for skilled PT   Person educated: Patient Education method: Explanation, Demonstration, and Handouts Education comprehension: verbalized understanding, returned demonstration, and needs further education  HOME EXERCISE PROGRAM: Access Code: [email or web address] URL: [email or web address] Date: 05/07/2023 Prepared by: Mauri Reading  Exercises - Seated Ankle Circles  - 2 x daily - 7 x weekly - 2 sets - 10 reps - Seated Ankle Pumps on Table  - 2 x  daily - 7 x weekly - 2 sets - 10 reps - Seated Ankle Alphabet  - 2 x daily - 7 x weekly - 1 sets - 10 reps - Ankle Inversion Eversion Towel Slide  - 2 x daily - 7 x weekly - 2 sets - 10 reps - Long Sitting Plantar Fascia Stretch with Towel  - 2 x daily - 7 x weekly - 2 sets - 20-30 sec hold  ASSESSMENT:  CLINICAL IMPRESSION: 05/14/2023:  Danise was more limited by pain today. Spent time providing manual therapy today to address pain modulation and TPR. She had fair tolerance of this, and was able to complete therapeutic exercises afterwards. Set up cold pack to achilles and proximal plantar fascia at end of session.  We will continue to progress exercises as tolerated at next visit.    Eval: Patient is a 47 y.o. female  who was seen today for physical therapy evaluation and treatment for bilateral Achilles tendon pain, with proximal plantar pain.She is demonstrating decreased ankle AROM, decreased ankle strength, altered gait mechanics. She has related pain and difficulty with prolonged sitting, prolonged standing, walking, stair climbing as related to daily activities and occupational duties. She requires skilled PT services at this time to address relevant deficits and improve overall function.     OBJECTIVE IMPAIRMENTS: Abnormal gait, decreased activity tolerance, decreased balance, decreased endurance, decreased mobility, difficulty walking, decreased strength, and pain.   ACTIVITY LIMITATIONS: carrying, lifting, standing, squatting, stairs, and locomotion level  PARTICIPATION LIMITATIONS: meal prep, cleaning, driving, shopping, community activity, and occupation  PERSONAL FACTORS: Past/current experiences, Profession, Time since onset of injury/illness/exacerbation, and 3+ comorbidities: Relevant past medical history includes anxiety, arthritis, bipolar disorder, fibromyalgia, hypertension, obesity, type 2 diabetes, cervical spine stenosis with myelopathy, hypothyroidism, OSA  are also  affecting patient's functional outcome.   REHAB POTENTIAL: Fair    CLINICAL DECISION MAKING: Stable/uncomplicated  EVALUATION COMPLEXITY: Low   GOALS: Goals reviewed with patient? Yes  SHORT TERM GOALS: Target date: 05/22/2023   Patient will be independent with initial home program for ankle mobility, strengthening, foot intrinsic exercises.  Baseline: provided at eval  Goal status: INITIAL   LONG TERM GOALS: Target date: 06/19/2023   Patient will report improved overall functional ability with FOTO score increased by 10 points or greater   Baseline: to be assessed at first f/u visit.  Goal status: INITIAL  2.  Patient will demonstrate at least 4+/5 MMT with resisted testing bilaterally Baseline:  MMT Right eval Left eval  Ankle dorsiflexion 4+ 4  Ankle plantarflexion 4 4-  Ankle inversion 4 4  Ankle eversion 4 4   Goal status: INITIAL  3.  Patient will demonstrate full ankle AROM, with dorsiflexion 12 degrees or greater Baseline:  Active ROM Right eval Left eval  Ankle dorsiflexion 10 8   Goal status: INITIAL  4.  Patient report ability to tolerate standing at work in appropriate shoes, without exacerbation of bilateral symptoms prior to scheduled rest breaks. Baseline: Moderate to severe pain with prolonged standing Goal status: INITIAL    PLAN:  PT FREQUENCY: 1x/week  PT DURATION: 8 weeks  PLANNED INTERVENTIONS: 97164- PT Re-evaluation, 97110-Therapeutic exercises, 97530- Therapeutic activity, 97112- Neuromuscular re-education, 97535- Self Care, 82956- Manual therapy, 97033- Ionotophoresis 4mg /ml Dexamethasone, Patient/Family education, Taping, Dry Needling, Joint mobilization, Joint manipulation, Cryotherapy, and Moist heat  PLAN FOR NEXT SESSION: At next visit plan to provide Foto assessment and provide HEP/review previous exercises from [facility]; Manual therapy and modalities as indicated, ankle AROM activities, ankle strengthening, foot intrinsic muscle  strengthening, weight shifting and progression of weightbearing tolerance   For all possible CPT codes, reference the Planned Interventions line above.     Check all conditions that are expected to impact treatment: {Conditions expected to impact treatment:Diabetes mellitus, Musculoskeletal disorders, Psychological or psychiatric disorders, and Social determinants of health   If treatment provided at initial evaluation, no treatment charged due to lack of authorization.        Mauri Reading, PT, DPT   05/14/2023, 9:15 AM

## 2023-05-13 NOTE — Telephone Encounter (Signed)
Pharmacy Patient Advocate Encounter  Received notification from [facility] that Prior Authorization for [facility] G7 SENSOR has been APPROVED from 05/11/2023 to 11/08/2023   PA #/Case ID/Reference #: [id number]

## 2023-05-14 ENCOUNTER — Ambulatory Visit: Payer: Medicaid Other

## 2023-05-14 ENCOUNTER — Ambulatory Visit: Payer: Medicaid Other | Admitting: Family Medicine

## 2023-05-14 ENCOUNTER — Encounter: Payer: Self-pay | Admitting: Family Medicine

## 2023-05-14 VITALS — BP 128/81 | HR 75 | Temp 97.6°F | Resp 18 | Ht 66.0 in | Wt 245.6 lb

## 2023-05-14 DIAGNOSIS — E1169 Type 2 diabetes mellitus with other specified complication: Secondary | ICD-10-CM

## 2023-05-14 DIAGNOSIS — Z7985 Long-term (current) use of injectable non-insulin antidiabetic drugs: Secondary | ICD-10-CM | POA: Diagnosis not present

## 2023-05-14 DIAGNOSIS — Z23 Encounter for immunization: Secondary | ICD-10-CM

## 2023-05-14 DIAGNOSIS — M25571 Pain in right ankle and joints of right foot: Secondary | ICD-10-CM

## 2023-05-14 DIAGNOSIS — D509 Iron deficiency anemia, unspecified: Secondary | ICD-10-CM | POA: Diagnosis not present

## 2023-05-14 DIAGNOSIS — N760 Acute vaginitis: Secondary | ICD-10-CM

## 2023-05-14 DIAGNOSIS — I10 Essential (primary) hypertension: Secondary | ICD-10-CM | POA: Diagnosis not present

## 2023-05-14 DIAGNOSIS — R262 Difficulty in walking, not elsewhere classified: Secondary | ICD-10-CM

## 2023-05-14 DIAGNOSIS — F17200 Nicotine dependence, unspecified, uncomplicated: Secondary | ICD-10-CM

## 2023-05-14 DIAGNOSIS — M25572 Pain in left ankle and joints of left foot: Secondary | ICD-10-CM | POA: Diagnosis not present

## 2023-05-14 MED ORDER — IRON (FERROUS SULFATE) 325 (65 FE) MG PO TABS: 325.0000 mg | ORAL_TABLET | Freq: Every day | ORAL | 1 refills | Status: DC

## 2023-05-14 MED ORDER — FLUCONAZOLE 150 MG PO TABS: 150.0000 mg | ORAL_TABLET | Freq: Once | ORAL | 0 refills | Status: AC

## 2023-05-15 LAB — IRON,TIBC AND FERRITIN PANEL
Ferritin: 29 ng/mL (ref 15–150)
Iron Saturation: 10 % — ABNORMAL LOW (ref 15–55)
Iron: 30 ug/dL (ref 27–159)
Total Iron Binding Capacity: 300 ug/dL (ref 250–450)
UIBC: 270 ug/dL (ref 131–425)

## 2023-05-15 LAB — CBC WITH DIFFERENTIAL/PLATELET
Basophils Absolute: 0 10*3/uL (ref 0.0–0.2)
Basos: 0 %
EOS (ABSOLUTE): 0.1 10*3/uL (ref 0.0–0.4)
Eos: 2 %
Hematocrit: 36 % (ref 34.0–46.6)
Hemoglobin: 11.4 g/dL (ref 11.1–15.9)
Immature Grans (Abs): 0 10*3/uL (ref 0.0–0.1)
Immature Granulocytes: 0 %
Lymphocytes Absolute: 1.7 10*3/uL (ref 0.7–3.1)
Lymphs: 35 %
MCH: 25.9 pg — ABNORMAL LOW (ref 26.6–33.0)
MCHC: 31.7 g/dL (ref 31.5–35.7)
MCV: 82 fL (ref 79–97)
Monocytes Absolute: 0.2 10*3/uL (ref 0.1–0.9)
Monocytes: 4 %
Neutrophils Absolute: 2.8 10*3/uL (ref 1.4–7.0)
Neutrophils: 59 %
Platelets: 387 10*3/uL (ref 150–450)
RBC: 4.41 x10E6/uL (ref 3.77–5.28)
RDW: 15 % (ref 11.7–15.4)
WBC: 4.8 10*3/uL (ref 3.4–10.8)

## 2023-05-15 LAB — BASIC METABOLIC PANEL
BUN/Creatinine Ratio: 12 (ref 9–23)
BUN: 9 mg/dL (ref 6–24)
CO2: 19 mmol/L — ABNORMAL LOW (ref 20–29)
Calcium: 9 mg/dL (ref 8.7–10.2)
Chloride: 105 mmol/L (ref 96–106)
Creatinine, Ser: 0.73 mg/dL (ref 0.57–1.00)
Glucose: 154 mg/dL — ABNORMAL HIGH (ref 70–99)
Potassium: 4.1 mmol/L (ref 3.5–5.2)
Sodium: 141 mmol/L (ref 134–144)
eGFR: 101 mL/min/{1.73_m2} (ref >59)

## 2023-05-15 LAB — LIPID PANEL
Chol/HDL Ratio: 5 ratio — ABNORMAL HIGH (ref 0.0–4.4)
Cholesterol, Total: 213 mg/dL — ABNORMAL HIGH (ref 100–199)
HDL: 43 mg/dL (ref >39)
LDL Chol Calc (NIH): 153 mg/dL — ABNORMAL HIGH (ref 0–99)
Triglycerides: 93 mg/dL (ref 0–149)
VLDL Cholesterol Cal: 17 mg/dL (ref 5–40)

## 2023-05-18 IMAGING — CT CT ABD-PELV W/ CM
2 of 5 series · 14 of 46 positions shown, 16 images · IV contrast (agent unspecified)
Comparison: No priors.

CLINICAL DATA: 45-year-old female with history of right lower
quadrant abdominal pain.

EXAM:
CT ABDOMEN AND PELVIS WITH CONTRAST
TECHNIQUE: Multidetector CT imaging of the abdomen and pelvis was performed
using the standard protocol following bolus administration of
intravenous contrast.

[Series 3: abdomen 5.0 · axial · 0.78mm/px · z∈[-580,-175]mm · 11 of 97 slices shown, 13 images]
[im 8/97  soft-tissue]
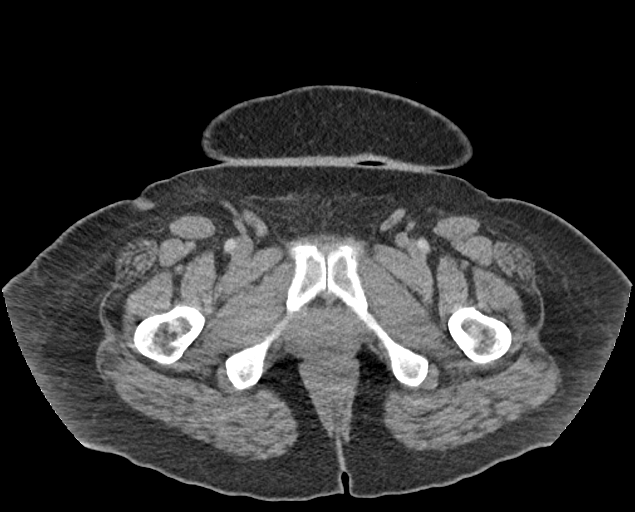
[im 8/97  bone]
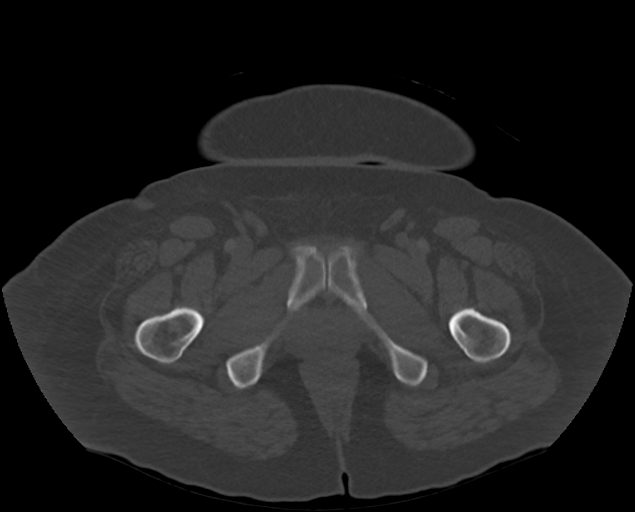
[im 15/97  soft-tissue]
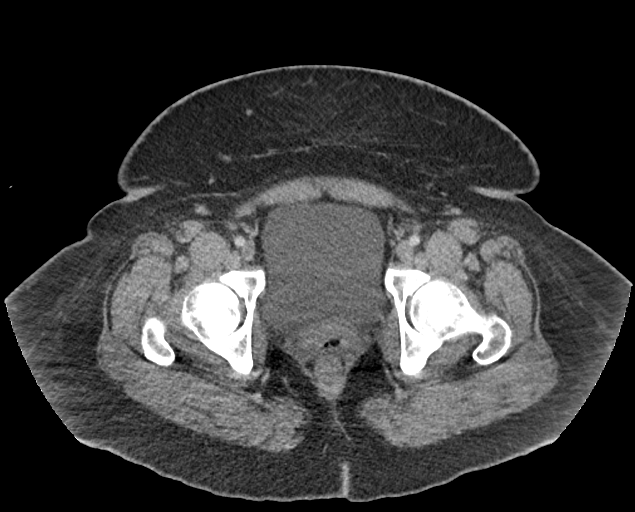
[im 23/97  soft-tissue]
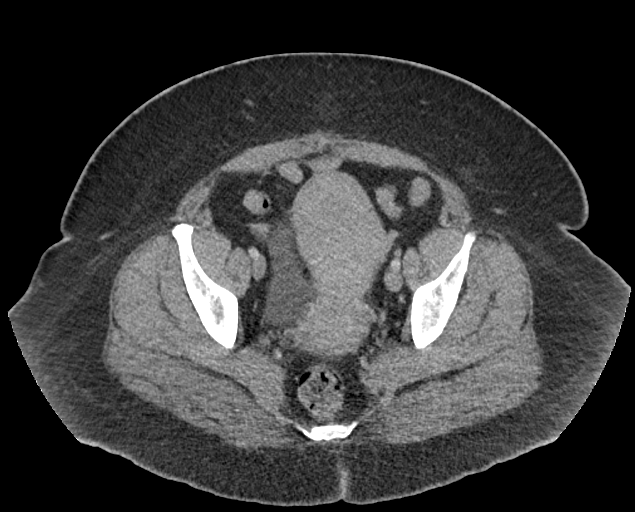
[im 30/97  soft-tissue]
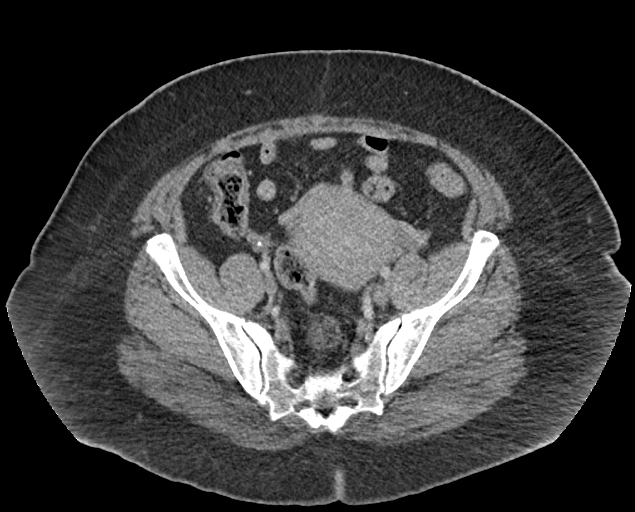
[im 37/97  soft-tissue]
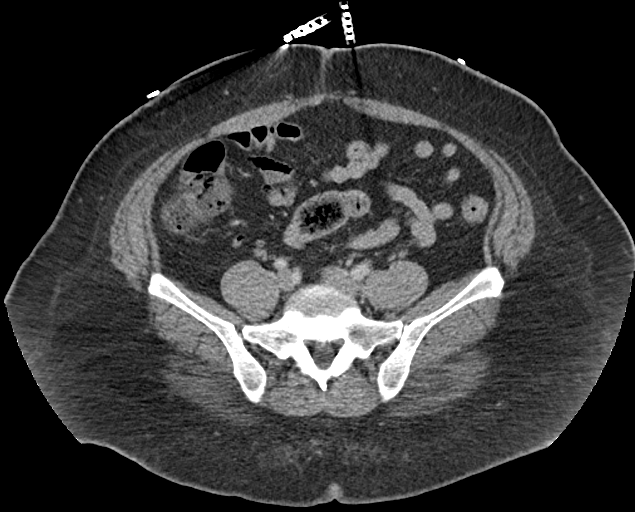
[im 52/97  soft-tissue]
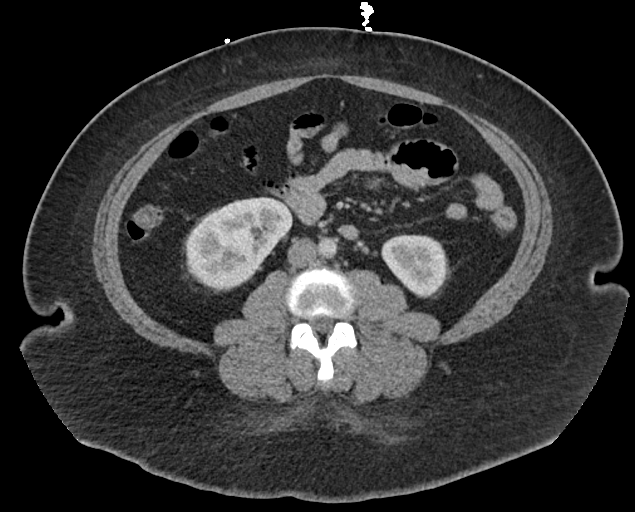
[im 60/97  soft-tissue]
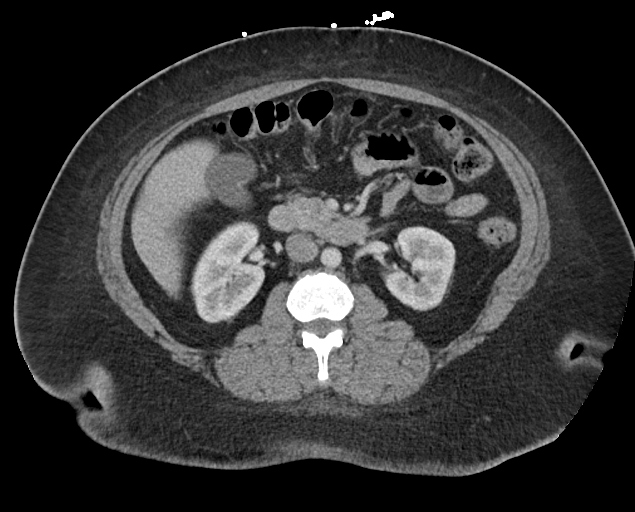
[im 67/97  soft-tissue]
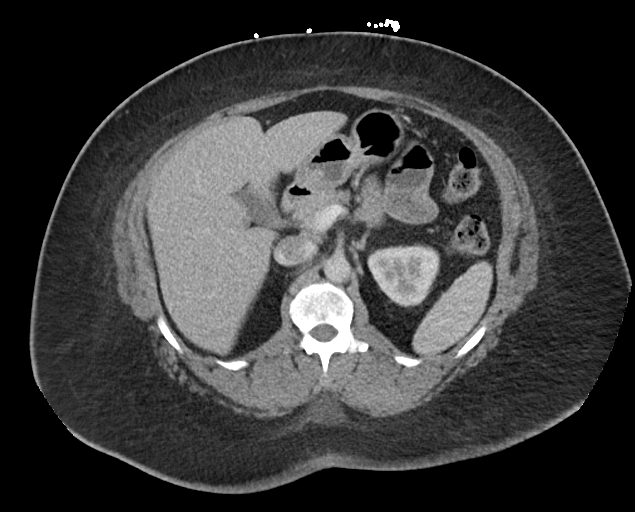
[im 74/97  soft-tissue]
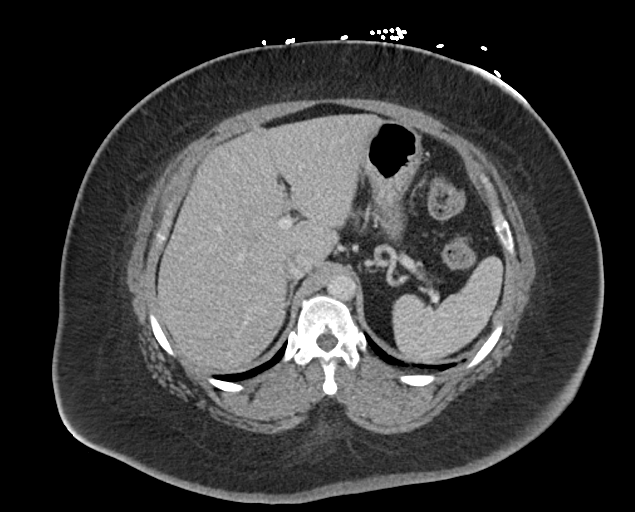
[im 74/97  bone]
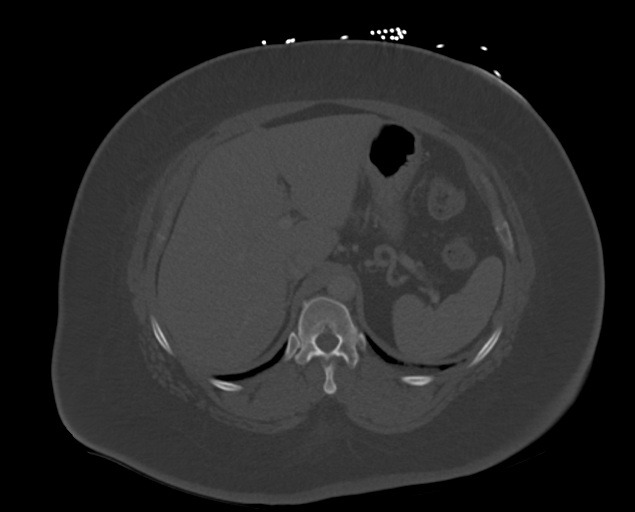
[im 82/97  soft-tissue]
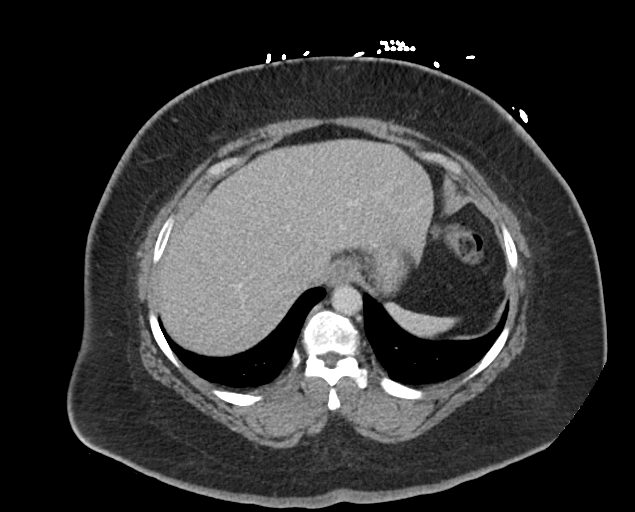
[im 89/97  soft-tissue]
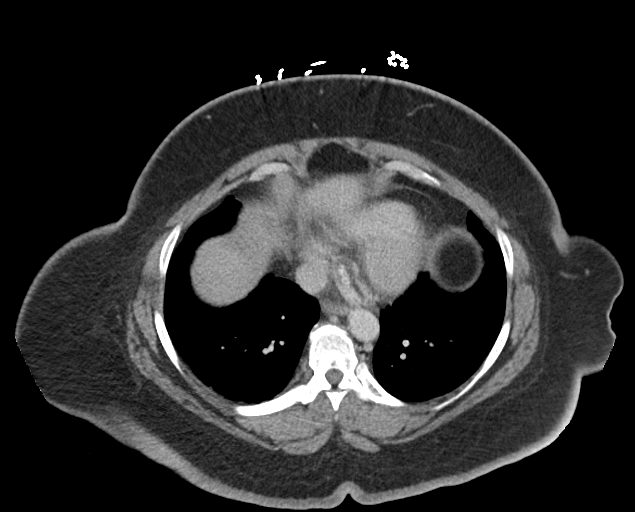

[Series 6: abdomen 3.0 mpr cor · coronal · 0.94mm/px · 3 of 132 slices shown]
[im 44/132  soft-tissue]
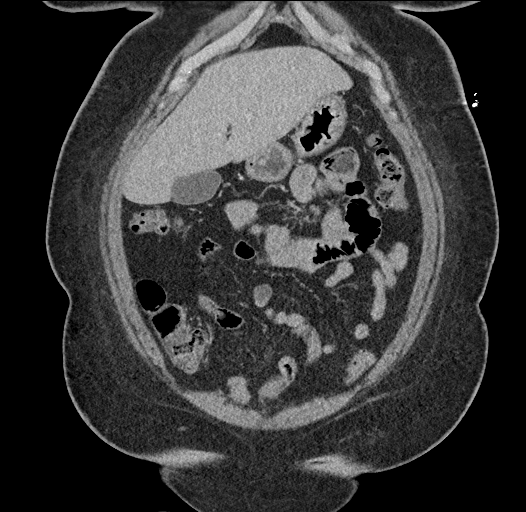
[im 59/132  soft-tissue]
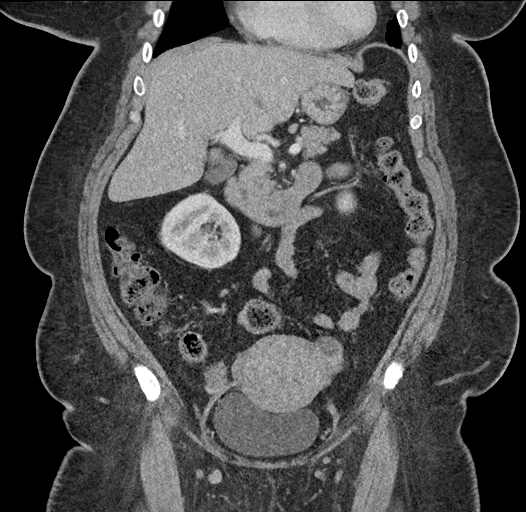
[im 73/132  soft-tissue]
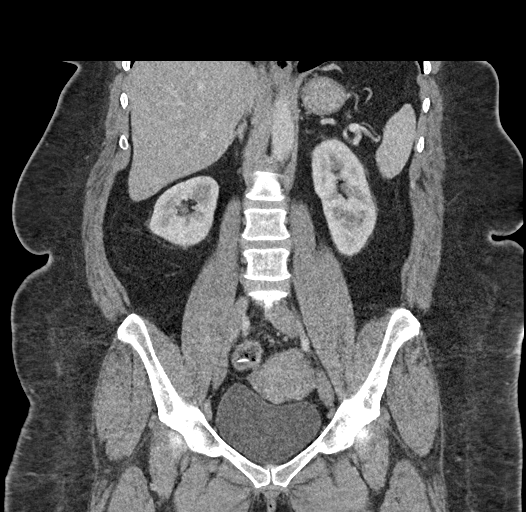

[14 of 46 positions shown; findings below may reference images not displayed]

RADIATION DOSE REDUCTION: This exam was performed according to the
departmental dose-optimization program which includes automated
exposure control, adjustment of the mA and/or kV according to
patient size and/or use of iterative reconstruction technique.

CONTRAST:  100mL OMNIPAQUE IOHEXOL 300 MG/ML  SOLN
FINDINGS: Lower chest: Unremarkable.

Hepatobiliary: No suspicious cystic or solid hepatic lesions. No
intra or extrahepatic biliary ductal dilatation. Gallbladder is
normal in appearance.

Pancreas: No pancreatic mass. No pancreatic ductal dilatation. No
pancreatic or peripancreatic fluid collections or inflammatory
changes.

Spleen: Unremarkable.

Adrenals/Urinary Tract: Bilateral kidneys and bilateral adrenal
glands are normal in appearance. No hydroureteronephrosis. Urinary
bladder is normal in appearance.

Stomach/Bowel: The appearance of the stomach is normal. There is no
pathologic dilatation of small bowel or colon. Normal appendix.

Vascular/Lymphatic: No significant atherosclerotic disease, aneurysm
or dissection noted in the abdominal or pelvic vasculature.
Circumaortic left renal vein (normal anatomical variant)
incidentally noted. No lymphadenopathy noted in the abdomen or
pelvis.

Reproductive: Uterus and ovaries are unremarkable in appearance.

Other: No significant volume of ascites. No pneumoperitoneum.

Musculoskeletal: There are no aggressive appearing lytic or blastic
lesions noted in the visualized portions of the skeleton.
IMPRESSION: 1. No acute findings are noted in the abdomen or pelvis to account
for the patient's symptoms. Specifically, the appendix is normal.

## 2023-05-19 ENCOUNTER — Encounter: Payer: Self-pay | Admitting: Family Medicine

## 2023-05-19 ENCOUNTER — Other Ambulatory Visit: Payer: Self-pay | Admitting: Family Medicine

## 2023-05-19 MED ORDER — ATORVASTATIN CALCIUM 20 MG PO TABS: 20.0000 mg | ORAL_TABLET | Freq: Every day | ORAL | 0 refills | Status: DC

## 2023-05-19 NOTE — Progress Notes (Signed)
Established Patient Office Visit  Subjective    Patient ID: Ruth [name], female    DOB: 09-Jun-1976  Age: 47 y.o. MRN: [id number]  CC:  Chief Complaint  Patient presents with   Follow-up    6 months    HPI Ruth [name] presents for routine follow up with med refills. Patient also reports that she has a vaginal yeast infection.   Outpatient Encounter Medications as of 05/14/2023  Medication Sig   albuterol (PROVENTIL) (2.5 MG/3ML) 0.083% nebulizer solution Take 3 mLs (2.5 mg total) by nebulization every 6 (six) hours as needed for wheezing or shortness of breath.   albuterol (VENTOLIN HFA) 108 (90 Base) MCG/ACT inhaler Inhale 2 puffs into the lungs every 4 (four) hours as needed for wheezing or shortness of breath.   Continuous Glucose Receiver (DEXCOM G7 RECEIVER) DEVI Use to check blood sugar three times a day .   Continuous Glucose Sensor (DEXCOM G7 SENSOR) MISC Use to check blood sugar three time daily. Change sensors once every 10 days.   [EXPIRED] fluconazole (DIFLUCAN) 150 MG tablet Take 1 tablet (150 mg total) by mouth once for 1 dose.   fluticasone (FLONASE) 50 MCG/ACT nasal spray Place 1 spray into both nostrils daily.   fluticasone (FLOVENT HFA) 110 MCG/ACT inhaler Inhale 2 puffs into the lungs 2 (two) times daily.   hydrOXYzine (ATARAX) 25 MG tablet Take 1 tablet (25 mg total) by mouth 3 (three) times daily as needed.   ibuprofen (ADVIL) 800 MG tablet Take 1 tablet (800 mg total) by mouth 3 (three) times daily.   Iron, Ferrous Sulfate, 325 (65 Fe) MG TABS Take 325 mg by mouth daily.   meloxicam (MOBIC) 15 MG tablet Take 1 tablet (15 mg total) by mouth daily.   montelukast (SINGULAIR) 10 MG tablet Take 1 tablet (10 mg total) by mouth at bedtime.   mupirocin ointment (BACTROBAN) 2 % Apply 1 Application topically 2 (two) times daily.   nystatin cream (MYCOSTATIN) Apply 1 Application topically 2 (two) times daily.   OLANZapine (ZYPREXA) 5 MG tablet  Take 1 tablet (5 mg total) by mouth at bedtime.   tirzepatide [facility]) 2.5 MG/0.5ML Pen Inject 2.5 mg into the skin once a week.   traMADol (ULTRAM) 50 MG tablet Take by mouth.   cetirizine (ZYRTEC ALLERGY) 10 MG tablet Take 1 tablet (10 mg total) by mouth daily.   No facility-administered encounter medications on file as of 05/14/2023.    Past Medical History:  Diagnosis Date   Abnormal uterine bleeding 2023   Anemia 2023   Anxiety    Arthritis    chronic back pain    Asthma    rarely uses inhaler   Bipolar 1 disorder (HCC)    Follows w/ PCP Dr. Georganna Skeans.   Fibromyalgia    GERD (gastroesophageal reflux disease)    Hidradenitis    multiple boils, axilla and groin (As of 08/03/22, patient has a boil in her left axilla.)   HLD (hyperlipidemia)    diet controlled   Hx of migraines    occ   Hypertension    no meds   Hypothyroidism    Intentional drug overdose (HCC) 03/28/2021   Obesity    Sleep apnea    Patient states that she had a sleep study in the past and was told she did not need a CPAP.   Thrombocytosis 01/26/2020   Type 2 diabetes mellitus (HCC)    Type 2, Follows w/ PCP Dr. Lauris Poag  Ruth [name].   Wears glasses     Past Surgical History:  Procedure Laterality Date   ANTERIOR CERVICAL DECOMP/DISCECTOMY FUSION N/A 01/16/2021   Procedure: Cervical five-six Anterior cervical decompression/discectomy/fusion;  Surgeon: Lisbeth Renshaw, MD;  Location: [facility] OR;  Service: Neurosurgery;  Laterality: N/A;   CARPAL TUNNEL RELEASE Right    around 2009   CESAREAN SECTION     x2, 2003 & 2008   CYSTOSCOPY N/A 08/12/2022   Procedure: CYSTOSCOPY;  Surgeon: Lorriane Shire, MD;  Location: [facility];  Service: Gynecology;  Laterality: N/A;   DILITATION & CURRETTAGE/HYSTROSCOPY WITH NOVASURE ABLATION N/A 12/07/2017   Procedure: DILATATION & CURETTAGE/HYSTEROSCOPY WITH ATTEMPED NOVASURE ABLATION, FAILED/ENDOMETERIAL ABLATION WITH HTA SYSTEM;  Surgeon:  Willodean Rosenthal, MD;  Location: [facility] ORS;  Service: Gynecology;  Laterality: N/A;   ELBOW SURGERY     "surgery for nerve pain" around 2009   ENDOMETRIAL BIOPSY  07/23/2022   negative for hyperplasia or malignancy   GANGLION CYST EXCISION Right    right arm    RECTOVAGINAL FISTULA CLOSURE     in 6213,0865, 2006   ROBOTIC ASSISTED TOTAL HYSTERECTOMY Bilateral 08/12/2022   Procedure: XI ROBOTIC ASSISTED TOTAL HYSTERECTOMY WITH SALPINGECTOMY;  Surgeon: Lorriane Shire, MD;  Location: [facility];  Service: Gynecology;  Laterality: Bilateral;   TONSILLECTOMY     childhood   TUBAL LIGATION  2008   XI ROBOT ASSISTED RECTOPEXY N/A 08/12/2022   Procedure: XI robot repair serosal repair of rectum;  Surgeon: Karie Soda, MD;  Location: [facility];  Service: General;  Laterality: N/A;    Family History  Problem Relation Age of Onset   Thyroid disease Mother    Diabetes Daughter    Diabetes Son    Breast cancer Neg Hx     Social History   Socioeconomic History   Marital status: Married    Spouse name: Not on file   Number of children: Not on file   Years of education: Not on file   Highest education level: GED or equivalent  Occupational History   Not on file  Tobacco Use   Smoking status: Every Day    Current packs/day: 0.25    Types: Cigarettes   Smokeless tobacco: Never   Tobacco comments:    Per patient, she only smokes 2 to 3 cigarettes daily as of 08/03/22.  Vaping Use   Vaping status: Never Used  Substance and Sexual Activity   Alcohol use: Not Currently   Drug use: Yes    Types: Marijuana    Comment: Occasional use only   Sexual activity: Yes    Birth control/protection: Surgical    Comment: tubal ligation  Other Topics Concern   Not on file  Social History Narrative   Not on file   Social Determinants of Health   Financial Resource Strain: Low Risk  (04/26/2023)   Overall Financial Resource Strain (CARDIA)    Difficulty of  Paying Living Expenses: Not very hard  Food Insecurity: No Food Insecurity (04/26/2023)   Hunger Vital Sign    Worried About Running Out of Food in the Last Year: Never true    Ran Out of Food in the Last Year: Never true  Transportation Needs: No Transportation Needs (04/26/2023)   PRAPARE - Administrator, Civil Service (Medical): No    Lack of Transportation (Non-Medical): No  Physical Activity: Unknown (04/26/2023)   Exercise Vital Sign    Days of Exercise per Week: Patient declined  Minutes of Exercise per Session: Not on file  Stress: No Stress Concern Present (04/26/2023)   [employer] of Occupational Health - Occupational Stress Questionnaire    Feeling of Stress : Only a little  Social Connections: Unknown (04/26/2023)   Social Connection and Isolation Panel [NHANES]    Frequency of Communication with Friends and Family: Patient declined    Frequency of Social Gatherings with Friends and Family: More than three times a week    Attends Religious Services: Patient declined    Database administrator or Organizations: No    Attends Engineer, structural: Not on file    Marital Status: Married  Catering manager Violence: Not At Risk (05/14/2023)   Humiliation, Afraid, Rape, and Kick questionnaire    Fear of Current or Ex-Partner: No    Emotionally Abused: No    Physically Abused: No    Sexually Abused: No    Review of Systems  All other systems reviewed and are negative.       Objective    BP 128/81 (BP Location: Right Arm, Patient Position: Sitting, Cuff Size: Large)   Pulse 75   Temp 97.6 F (36.4 C) (Oral)   Resp 18   Ht 5\' 6"  (1.676 m)   Wt 245 lb 9.6 oz (111.4 kg)   LMP 07/05/2022   SpO2 98%   BMI 39.64 kg/m   Physical Exam Vitals and nursing note reviewed.  Constitutional:      General: She is not in acute distress.    Appearance: She is obese.  Cardiovascular:     Rate and Rhythm: Normal rate and regular rhythm.   Pulmonary:     Effort: Pulmonary effort is normal.     Breath sounds: Normal breath sounds.  Abdominal:     Palpations: Abdomen is soft.     Tenderness: There is no abdominal tenderness.  Neurological:     General: No focal deficit present.     Mental Status: She is alert and oriented to person, place, and time.         Assessment & Plan:   Type 2 diabetes mellitus with other specified complication, without long-term current use of insulin (HCC)  Long-term current use of injectable noninsulin antidiabetic medication  Essential hypertension -     Lipid panel  Iron deficiency anemia, unspecified iron deficiency anemia type -     CBC with Differential/Platelet -     Iron, TIBC and Ferritin Panel -     Basic metabolic panel  Vaginitis and vulvovaginitis  Smoker  Immunization due -     Tdap vaccine greater than or equal to 7yo IM  Other orders -     Fluconazole; Take 1 tablet (150 mg total) by mouth once for 1 dose.  Dispense: 1 tablet; Refill: 0 -     Iron (Ferrous Sulfate); Take 325 mg by mouth daily.  Dispense: 90 tablet; Refill: 1     Return in about 3 months (around 08/14/2023).   Tommie Raymond, MD

## 2023-05-21 ENCOUNTER — Ambulatory Visit: Payer: Medicaid Other | Admitting: Physical Therapy

## 2023-05-24 ENCOUNTER — Ambulatory Visit (INDEPENDENT_AMBULATORY_CARE_PROVIDER_SITE_OTHER): Payer: Medicaid Other | Admitting: Podiatry

## 2023-05-24 ENCOUNTER — Encounter: Payer: Self-pay | Admitting: Podiatry

## 2023-05-24 DIAGNOSIS — M7662 Achilles tendinitis, left leg: Secondary | ICD-10-CM

## 2023-05-24 DIAGNOSIS — M7661 Achilles tendinitis, right leg: Secondary | ICD-10-CM

## 2023-05-24 MED ORDER — CELECOXIB 100 MG PO CAPS: 100.0000 mg | ORAL_CAPSULE | Freq: Two times a day (BID) | ORAL | 0 refills | Status: DC

## 2023-05-24 NOTE — Progress Notes (Signed)
Subjective:  Patient ID: Kary Kos, female    DOB: 1975-10-16,   MRN: [id number]  No chief complaint on file.   47 y.o. female presents for follow-up of bilateral achilles tendonitis. Relates no improvement in pain and no improvement whit PT. Relates left side has been more painful. Relates she has been stretching and not finding any shoes comfortable. It has been difficult to work. Medications did not help. She is ready to review MRI and potentially discuss surgery. She does smok and history of Type 2 diabetes. Relates last A1c was 6.2   PCP:  Georganna Skeans, MD     Denies any other pedal complaints. Denies n/v/f/c.   Past Medical History:  Diagnosis Date   Abnormal uterine bleeding 2023   Anemia 2023   Anxiety    Arthritis    chronic back pain    Asthma    rarely uses inhaler   Bipolar 1 disorder (HCC)    Follows w/ PCP Dr. Georganna Skeans.   Fibromyalgia    GERD (gastroesophageal reflux disease)    Hidradenitis    multiple boils, axilla and groin (As of 08/03/22, patient has a boil in her left axilla.)   HLD (hyperlipidemia)    diet controlled   Hx of migraines    occ   Hypertension    no meds   Hypothyroidism    Intentional drug overdose (HCC) 03/28/2021   Obesity    Sleep apnea    Patient states that she had a sleep study in the past and was told she did not need a CPAP.   Thrombocytosis 01/26/2020   Type 2 diabetes mellitus (HCC)    Type 2, Follows w/ PCP Dr. Georganna Skeans.   Wears glasses     Objective:  Physical Exam: Vascular: DP/PT pulses 2/4 bilateral. CFT <3 seconds. Normal hair growth on digits. No edema.  Skin. No lacerations or abrasions bilateral feet.  Musculoskeletal: MMT 5/5 bilateral lower extremities in DF, PF, Inversion and Eversion. Deceased ROM in DF of ankle joint. Most tender to proximal achilles tendon bilateral in watershed area. Some pain to insertion site bilateral. Some pain along PT tendon and medial calcaneal tubercle  bilateral  Neurological: Sensation intact to light touch.   MRI left  IMPRESSION: 1. High-grade marrow edema throughout a moderate chronic ossified enthesopathic spur posterior to the distal Achilles tendon insertion. This likely represents acute stress related change within the enthesopathic spur that likely formed as a result of chronic distal Achilles traction stress changes/tendinosis. Mild distal Achilles tendinosis. No tendon retraction. 2. Mild posterior tibial and flexor hallucis longus tenosynovitis. 3. Large plantar calcaneal heel spur. The plantar fascia origin is intact.  MRI right  IMPRESSION: 1. Moderate chronic ossified enthesopathic spur posterior distal to the Achilles tendon insertion with near fluid bright marrow edema within this enthesophyte. There is mild distal Achilles insertional tendinosis. Punctate 1 mm possible interstitial tear at the far distal medial Achilles tendon insertion. No tendon retraction. These findings likely reflect acute on chronic Achilles tendinosis. 2. Moderate-to-large plantar calcaneal heel spur. The plantar fascia origin is intact. 3. Mild cartilage thinning and subchondral marrow edema within the anterior aspect of the middle subtalar joint.   Assessment:   1. Achilles tendonitis, bilateral       Plan:  Patient was evaluated and treated and all questions answered. -Xrays reviewed. Spurring noted to inferior and posterior calcaneus bilateral.  -Discussed Achilles insertional tendonitis and treatment options with patient.  -Continue stretching  -Continue PT -  Celebrex to aid with pain -MRI reviewed in detail.  -Discussed options from now including surgical intervention vs PRP injections. Patient relates she is ready to discuss surgery.  -Discussed in detail perioperative course and non weightbearing period.  -Informed surgical risk consent was reviewed and read aloud to the patient.  I reviewed the films.  I have discussed  my findings with the patient in great detail.  I have discussed all risks including but not limited to infection, stiffness, scarring, limp, disability, deformity, damage to blood vessels and nerves, numbness, poor healing, need for braces, arthritis, chronic pain, amputation, death.  All benefits and realistic expectations discussed in great detail.  I have made no promises as to the outcome.  I have provided realistic expectations.  I have offered the patient a 2nd opinion, which they have declined and assured me they preferred to proceed despite the risks. -Discussed smoking cessation and needing to quit 4 weeks before hand and 4 weeks after. Discussed serious consequences of wound healing if continues to smoke. Also discussed risks with Type 2 diabetes and last A1c was well controlled at 6.2  -Will plan for left achilles tendon secondary repair with haglunds resection.  -Post-op: oxycodone, keflex, ASA bid, and zofran  Plan for surgery in beginning of year pending smoking cessation. Louann Sjogren, DPM

## 2023-05-25 ENCOUNTER — Other Ambulatory Visit: Payer: Self-pay

## 2023-05-28 ENCOUNTER — Ambulatory Visit: Payer: Medicaid Other

## 2023-05-28 DIAGNOSIS — M25572 Pain in left ankle and joints of left foot: Secondary | ICD-10-CM

## 2023-05-28 DIAGNOSIS — R262 Difficulty in walking, not elsewhere classified: Secondary | ICD-10-CM

## 2023-05-28 DIAGNOSIS — M25571 Pain in right ankle and joints of right foot: Secondary | ICD-10-CM

## 2023-05-28 NOTE — Therapy (Signed)
OUTPATIENT PHYSICAL THERAPY NOTE    Patient Name: Ruth [name] MRN: [id number] DOB:07-29-1975, 47 y.o., female Today's Date:  05/28/2023   END OF SESSION:  PT End of Session - 05/28/23 0834     Visit Number 4    Number of Visits 9    Date for PT Re-Evaluation 06/19/23    Authorization Type [employer]    Authorization - Visit Number 4    Authorization - Number of Visits 27    Progress Note Due on Visit 9    PT Start Time 0830    PT Stop Time 0910    PT Time Calculation (min) 40 min    Activity Tolerance Patient tolerated treatment well                Past Medical History:  Diagnosis Date   Abnormal uterine bleeding 2023   Anemia 2023   Anxiety    Arthritis    chronic back pain    Asthma    rarely uses inhaler   Bipolar 1 disorder (HCC)    Follows w/ PCP Dr. Georganna Skeans.   Fibromyalgia    GERD (gastroesophageal reflux disease)    Hidradenitis    multiple boils, axilla and groin (As of 08/03/22, patient has a boil in her left axilla.)   HLD (hyperlipidemia)    diet controlled   Hx of migraines    occ   Hypertension    no meds   Hypothyroidism    Intentional drug overdose (HCC) 03/28/2021   Obesity    Sleep apnea    Patient states that she had a sleep study in the past and was told she did not need a CPAP.   Thrombocytosis 01/26/2020   Type 2 diabetes mellitus (HCC)    Type 2, Follows w/ PCP Dr. Georganna Skeans.   Wears glasses    Past Surgical History:  Procedure Laterality Date   ANTERIOR CERVICAL DECOMP/DISCECTOMY FUSION N/A 01/16/2021   Procedure: Cervical five-six Anterior cervical decompression/discectomy/fusion;  Surgeon: Lisbeth Renshaw, MD;  Location: [facility] OR;  Service: Neurosurgery;  Laterality: N/A;   CARPAL TUNNEL RELEASE Right    around 2009   CESAREAN SECTION     x2, 2003 & 2008   CYSTOSCOPY N/A 08/12/2022   Procedure: CYSTOSCOPY;  Surgeon: Lorriane Shire, MD;  Location: [facility];  Service: Gynecology;   Laterality: N/A;   DILITATION & CURRETTAGE/HYSTROSCOPY WITH NOVASURE ABLATION N/A 12/07/2017   Procedure: DILATATION & CURETTAGE/HYSTEROSCOPY WITH ATTEMPED NOVASURE ABLATION, FAILED/ENDOMETERIAL ABLATION WITH HTA SYSTEM;  Surgeon: Willodean Rosenthal, MD;  Location: [facility] ORS;  Service: Gynecology;  Laterality: N/A;   ELBOW SURGERY     "surgery for nerve pain" around 2009   ENDOMETRIAL BIOPSY  07/23/2022   negative for hyperplasia or malignancy   GANGLION CYST EXCISION Right    right arm    RECTOVAGINAL FISTULA CLOSURE     in 2725,3664, 2006   ROBOTIC ASSISTED TOTAL HYSTERECTOMY Bilateral 08/12/2022   Procedure: XI ROBOTIC ASSISTED TOTAL HYSTERECTOMY WITH SALPINGECTOMY;  Surgeon: Lorriane Shire, MD;  Location: [facility];  Service: Gynecology;  Laterality: Bilateral;   TONSILLECTOMY     childhood   TUBAL LIGATION  2008   XI ROBOT ASSISTED RECTOPEXY N/A 08/12/2022   Procedure: XI robot repair serosal repair of rectum;  Surgeon: Karie Soda, MD;  Location: [facility];  Service: General;  Laterality: N/A;   Patient Active Problem List   Diagnosis Date Noted   Stenosis of cervical  spine with myelopathy (HCC) 01/16/2021   BMI 45.0-49.9, adult (HCC) 09/04/2020   Right arm pain 02/14/2020   Hyperlipidemia associated with type 2 diabetes mellitus (HCC) 09/19/2019   Abnormal uterine bleeding (AUB) 12/07/2017   Abnormal mammogram of both breasts 11/02/2016   Hypothyroidism 06/21/2009   Type 2 diabetes mellitus with peripheral neuropathy (HCC) 06/21/2009   BIPOLAR AFFECTIVE DISORDER 06/21/2009   PERSISTENT DISORDER INITIATING/MAINTAINING SLEEP 06/21/2009   INADEQUATE SLEEP HYGIENE 06/21/2009   OBSTRUCTIVE SLEEP APNEA 06/21/2009   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   CARPAL TUNNEL SYNDROME 06/20/2009   MERALGIA PARESTHETICA 06/20/2009   Essential hypertension 06/20/2009   FIBROMYALGIA 06/20/2009   Headache 06/20/2009    PCP: Georganna Skeans, MD  REFERRING  PROVIDER: Louann Sjogren, DPM   REFERRING DIAG: [id number] (ICD-10-CM) - Achilles tendonitis, bilateral   THERAPY DIAG:  Pain in left ankle and joints of left foot  Pain in right ankle and joints of right foot  Difficulty in walking, not elsewhere classified  Rationale for Evaluation and Treatment: Rehabilitation  ONSET DATE: 04/06/2023 date of referral  SUBJECTIVE:   SUBJECTIVE STATEMENT: 05/28/2023: Patient was recently seen by referring provider. She states that after that visit she is going to schedule surgery for her left foot/achilles, and would like to finish PT by the end of the year. She has some questions about why her Lt side is more painful in comparison to imaging results.    PERTINENT HISTORY: Relevant past medical history includes anxiety, arthritis, bipolar disorder, fibromyalgia, hypertension, obesity, type 2 diabetes, cervical spine stenosis with myelopathy, hypothyroidism, OSA  PAIN:  Are you having pain? Yes: NPRS scale: 2-10/10 Pain location: L>R posterior, plantar bil ankle/foot Pain description: throbbing, stabbing  Aggravating factors: Wearing sneaker, seated after prolonged activity  Relieving factors: none reported, heel raises, exercises before I get out of bed  PRECAUTIONS: None  RED FLAGS: None   WEIGHT BEARING RESTRICTIONS: Yes wearing CAM boot  FALLS:  Has patient fallen in last 6 months? No  LIVING ENVIRONMENT: Lives with: lives with their family Lives in: House/apartment Stairs: Yes: Internal: 13 steps; on right going up and External: 1 steps; none Has following equipment at home: None  OCCUPATION: Cashier   PLOF: Independent  PATIENT GOALS: Patient would like to have less pain with standing, walking, wearing her sneakers  NEXT MD VISIT: 04/27/23  OBJECTIVE:  Note: Objective measures were completed at Evaluation unless otherwise noted.  DIAGNOSTIC FINDINGS:   Scheduled to receive bilateral heel MRI on 05/05/2023  BIL  Ankle radiographs taken 02/23/2023  Per office note: "Xrays reviewed. Spurring noted to inferior and posterior calcaneus bilateral."     PATIENT SURVEYS:  FOTO to be completed at follow-up  COGNITION: Overall cognitive status: Within functional limits for tasks assessed     SENSATION: Not tested  EDEMA:  No notable findings  PALPATION: Some tenderness noted at distal attachment of Achilles tendon, distal gastroc/soleus, along calcaneal insertion of plantar fascia  LOWER EXTREMITY ROM:  Active ROM Right eval Left eval  Hip flexion    Hip extension    Hip abduction    Hip adduction    Hip internal rotation    Hip external rotation    Knee flexion    Knee extension    Ankle dorsiflexion 10 8  Ankle plantarflexion WNL WNL  Ankle inversion WNL WNL  Ankle eversion WNL WNL   (Blank rows = not tested)  LOWER EXTREMITY MMT:  MMT Right eval Left eval  Ankle dorsiflexion 4+ 4  Ankle  plantarflexion 4 4-  Ankle inversion 4 4  Ankle eversion 4 4   (Blank rows = not tested)   GAIT: Distance walked: 100 ft  Assistive device utilized:  Left cam boot and None Level of assistance: Complete Independence Comments: Gait mechanics altered due to the cam boot, to be formally assist with after removal of cam boot  Gait assessed 05/07/23 without shoes or cam boot: Patient ambulates with excessive supination bilaterally, and is unable to maintain neutral position > 3 consecutive steps  TODAY'S TREATMENT:       [facility] Adult PT Treatment:                                                DATE: 05/28/2023  Therapeutic Exercise: NuStep x 8 minutes, level 4  Marble Pickups x 1 round on each LE  Inversion/Eversion towel slides  Plantar Fascia stretch seated, 3x 30 sec  Resisted ankle 4-way with green TB, x 15  Therapeutic Activity:  Patient education regarding plan of remaining/updated POC.    [facility] Adult PT Treatment:                                                DATE:  05/14/2023  Therapeutic Exercise: NuStep x 5 minutes, level 4  Seated Ankle Pumps, x 15 Seated Ankle Alphabet x 1 round  Marble Pickups x 1 round on each LE  Inversion/Eversion towel slides  Plantar Fascia stretch seated, 3x 30 sec    Manual Therapy:  Soft Tissue Mobilization and IASTYM to L achilles tendon and Lt plantar fascia, laterally   Modalities: (unbilled)  Cold Pack applied to achilles and proximal plantarfascia at end of session                                                                                             [facility] Adult PT Treatment:                                                DATE: 05/07/2023  Therapeutic Exercise: NuStep x 5 minutes, level 4  Seated Ankle Pumps, 2 x 10  Seated Ankle Alphabet x 1 round  Towel Scrunches, 2 x 30 sec Marble Pickups x 1 round on each LE  Inversion/Eversion towel slides  Plantar Fascia stretch seated and standing  Created and reviewed initial HEP   Therapeutic Activity: Gait Assessment as noted above  Patient report outcome measure and review  [facility] Adult PT Treatment:                                                DATE: 04/24/2023   Initial evaluation: see patient education and home exercise program as noted below    Manual Therapy x 8 min:  STM to L achilles and plantarfascia   PATIENT EDUCATION:  Education details: reviewed initial home exercise program; discussion of POC, prognosis and goals for skilled PT   Person educated: Patient Education method: Explanation, Demonstration, and Handouts Education comprehension: verbalized understanding, returned demonstration, and needs further education  HOME EXERCISE PROGRAM: Access Code: [email or web address] URL: [email or web address] Date: 05/07/2023 Prepared by: Mauri Reading  Exercises - Seated Ankle Circles  - 2 x daily - 7 x weekly - 2 sets -  10 reps - Seated Ankle Pumps on Table  - 2 x daily - 7 x weekly - 2 sets - 10 reps - Seated Ankle Alphabet  - 2 x daily - 7 x weekly - 1 sets - 10 reps - Ankle Inversion Eversion Towel Slide  - 2 x daily - 7 x weekly - 2 sets - 10 reps - Long Sitting Plantar Fascia Stretch with Towel  - 2 x daily - 7 x weekly - 2 sets - 20-30 sec hold  ASSESSMENT:  CLINICAL IMPRESSION: 05/28/2023: Updating POC today to continue PT 1x/week for 4-5 additional weeks. Patient was able to begin ankle strengthening activities with resistance band, however, she does report pain with resisted ankle eversion.    Eval: Patient is a 47 y.o. female  who was seen today for physical therapy evaluation and treatment for bilateral Achilles tendon pain, with proximal plantar pain.She is demonstrating decreased ankle AROM, decreased ankle strength, altered gait mechanics. She has related pain and difficulty with prolonged sitting, prolonged standing, walking, stair climbing as related to daily activities and occupational duties. She requires skilled PT services at this time to address relevant deficits and improve overall function.     OBJECTIVE IMPAIRMENTS: Abnormal gait, decreased activity tolerance, decreased balance, decreased endurance, decreased mobility, difficulty walking, decreased strength, and pain.   ACTIVITY LIMITATIONS: carrying, lifting, standing, squatting, stairs, and locomotion level  PARTICIPATION LIMITATIONS: meal prep, cleaning, driving, shopping, community activity, and occupation  PERSONAL FACTORS: Past/current experiences, Profession, Time since onset of injury/illness/exacerbation, and 3+ comorbidities: Relevant past medical history includes anxiety, arthritis, bipolar disorder, fibromyalgia, hypertension, obesity, type 2 diabetes, cervical spine stenosis with myelopathy, hypothyroidism, OSA  are also affecting patient's functional outcome.   REHAB POTENTIAL: Fair    CLINICAL DECISION MAKING:  Stable/uncomplicated  EVALUATION COMPLEXITY: Low   GOALS: Goals reviewed with patient? Yes  SHORT TERM GOALS: Target date: 05/22/2023   Patient will be independent with initial home program for ankle mobility, strengthening, foot intrinsic exercises.  Baseline: provided at eval  Goal status: MET   LONG TERM GOALS: Target date: 07/06/2023, last updated 05/28/23    Patient will report improved overall functional ability with FOTO score increased by 10 points or greater   Baseline: to be assessed at first f/u visit.  Goal status: ONGOING  2.  Patient will demonstrate at least 4+/5 MMT with resisted testing bilaterally Baseline:  MMT Right eval Left eval  Ankle dorsiflexion 4+ 4  Ankle plantarflexion 4 4-  Ankle inversion 4 4  Ankle eversion 4 4   Goal status: ONGOING  3.  Patient will demonstrate full ankle AROM, with dorsiflexion 12 degrees or greater Baseline:  Active ROM Right eval Left eval  Ankle dorsiflexion 10 8   Goal status: ONGOING  4.  Patient report ability to tolerate standing at work in appropriate shoes, without exacerbation of bilateral symptoms prior to scheduled rest breaks. Baseline: Moderate to severe pain with prolonged standing Goal status: ONGOING     PLAN:  PT FREQUENCY: 1x/week  PT DURATION: 8 weeks  PLANNED INTERVENTIONS: 97164- PT Re-evaluation, 97110-Therapeutic exercises, 97530- Therapeutic activity, 97112- Neuromuscular re-education, 97535- Self Care, 16109- Manual therapy, 97033- Ionotophoresis 4mg /ml Dexamethasone, Patient/Family education, Taping, Dry Needling, Joint mobilization, Joint manipulation, Cryotherapy, and Moist heat  PLAN FOR NEXT SESSION:  Manual therapy and modalities as indicated, ankle AROM activities, ankle strengthening, foot intrinsic muscle strengthening, weight shifting and progression of weightbearing tolerance   For all possible CPT codes, reference the Planned Interventions line above.     Check all  conditions that are expected to impact treatment: {Conditions expected to impact treatment:Diabetes mellitus, Musculoskeletal disorders, Psychological or psychiatric disorders, and Social determinants of health   If treatment provided at initial evaluation, no treatment charged due to lack of authorization.        Mauri Reading, PT, DPT   05/28/2023, 10:30 AM

## 2023-06-01 ENCOUNTER — Other Ambulatory Visit: Payer: Self-pay

## 2023-06-01 ENCOUNTER — Ambulatory Visit: Payer: Medicaid Other | Attending: Family Medicine | Admitting: Pharmacist

## 2023-06-01 ENCOUNTER — Encounter: Payer: Self-pay | Admitting: Pharmacist

## 2023-06-01 DIAGNOSIS — Z7985 Long-term (current) use of injectable non-insulin antidiabetic drugs: Secondary | ICD-10-CM

## 2023-06-01 DIAGNOSIS — E1142 Type 2 diabetes mellitus with diabetic polyneuropathy: Secondary | ICD-10-CM

## 2023-06-01 MED ORDER — TIRZEPATIDE 10 MG/0.5ML ~~LOC~~ SOAJ
10.0000 mg | SUBCUTANEOUS | 0 refills | Status: DC
  Filled 2023-06-01 – 2023-07-17 (×2): qty 2, 28d supply, fill #0

## 2023-06-01 MED ORDER — TIRZEPATIDE 7.5 MG/0.5ML ~~LOC~~ SOAJ
7.5000 mg | SUBCUTANEOUS | 0 refills | Status: DC
  Filled 2023-06-01 – 2023-06-20 (×2): qty 2, 28d supply, fill #0

## 2023-06-01 MED ORDER — TIRZEPATIDE 5 MG/0.5ML ~~LOC~~ SOAJ
5.0000 mg | SUBCUTANEOUS | 0 refills | Status: DC
  Filled 2023-06-01: qty 2, 28d supply, fill #0

## 2023-06-01 NOTE — Progress Notes (Signed)
S:     No chief complaint on file.  47 y.o. female who presents for diabetes evaluation, education, and management. PMH is significant for T2DM and HLD.   Patient was referred and last seen by Primary Care Provider, Dr. Andrey Campanile, on 05/14/2023. Patient was last seen by Pharmacist, Butch Penny, on 04/27/2023. A1c at that visit was 6.2%. At that visit patient mentioned she is doing okay since increasing Ozempic. However, she developed some nausea and occasional vomiting with the 2mg  weekly dose. She noted that this occurred 1-2x weekly, specifically if she ate later in the evening. She has already tried and failed Victoza (pruritus). Ozempic was stopped due to GI side effects. Patient reported visual changes at that visit.  Today, patient arrives in  good spirits and presents without  any assistance. Patient denies N/V or abdominal pain with Mounjaro. Patient is tolerating diabetes medication well. Patient has no concerns today. Patient is doing well on glycemic control. Patient also desires weight loss due to which Mounjaro was increased to 5 mg x 4 weeks.  Family/Social History:  DM- daughter, brother, sister  No family or personal history of thyroid cancer (mother has history of hyperthyroidism requiring radiation).   Current diabetes medications include: Mounjaro 2.5 mg weekly  Current hypertension medications include: none Current hyperlipidemia medications include: Atorvastatin 20 mg daily  Patient reports adherence to taking all medications as prescribed.   Insurance coverage: Medicaid [employer]  Patient denies hypoglycemic events.  Patient denies nocturia (nighttime urination).  Patient reports neuropathy (nerve pain). Patient denies visual changes. Patient reports self foot exams.   Dietary habits: 2 meals a day -Appetite  -Has increased her water intake since last visit. Still will drink regular soda occasionally.  -snack: sandwiches, tacos   Exercise:  -Tries to do some  walking (limited due to pain) O:  Dexcom G7:  Last 30 days average: 123 GMI: 6.2% -Time in range 70-180: 98% -Time above range: 1 % high, 0 % very high -Time below range: <1% low, <1% very low  Lab Results  Component Value Date   HGBA1C 6.2 04/27/2023   There were no vitals filed for this visit.  Lipid Panel     Component Value Date/Time   CHOL 213 (H) 05/14/2023 1048   TRIG 93 05/14/2023 1048   HDL 43 05/14/2023 1048   CHOLHDL 5.0 (H) 05/14/2023 1048   LDLCALC 153 (H) 05/14/2023 1048    Clinical Atherosclerotic Cardiovascular Disease (ASCVD): No  The 10-year ASCVD risk score (Arnett DK, et al., 2019) is: 12%   Values used to calculate the score:     Age: 47 years     Sex: Female     Is Non-Hispanic African American: Yes     Diabetic: Yes     Tobacco smoker: Yes     Systolic Blood Pressure: 128 mmHg     Is BP treated: No     HDL Cholesterol: 43 mg/dL     Total Cholesterol: 213 mg/dL   Patient is participating in a Managed Medicaid Plan:  Yes   A/P: Diabetes longstanding currently controlled based on A1c of 6.2%. Patient is able to verbalize appropriate hypoglycemia management plan. Medication adherence appears optimal. Patient is doing great on glycemic control. Patient would also like weight loss due to which Mounjaro was increased to 5 mg x 4 weeks. Prescriptions were sent to pharmacy for the 5 mg x 4 weeks, 7.5 mg x 4 weeks and 10 mg x4 weeks to titrate  up. I explained to the patient if she starts to have intolerance as she titrates up she should call the clinic and let [facility] know.  -Adjusted dose of GLP-1 Mounjaro (tirzepatide) from 2.5 mg to 5 mg x4 weeks. -Patient educated on purpose, proper use, and potential adverse effects of medications.  -Extensively discussed pathophysiology of diabetes, recommended lifestyle interventions, dietary effects on blood sugar control.  -Counseled on s/sx of and management of hypoglycemia.  -Next A1c anticipated 07/2023.   ASCVD  risk - primary prevention in patient with diabetes. Last LDL is 153 not at goal of <16  mg/dL. ASCVD risk factors include T2DM, HLD and 10-year ASCVD risk score of 12%.  -Continued Atorvastatin 20 mg daily.   Written patient instructions provided. Patient verbalized understanding of treatment plan.  Total time in face to face counseling 30 minutes.    Follow-up:  Pharmacist: 07/29/2023 PCP clinic visit: 09/13/2023  Patient seen with: Erasmo Leventhal, PharmD Candidate  [employer] of [employer] SOP   Butch Penny, PharmD, [place], CPP Clinical Pharmacist [facility] [phone]

## 2023-06-08 ENCOUNTER — Encounter (HOSPITAL_COMMUNITY): Payer: Self-pay

## 2023-06-08 ENCOUNTER — Ambulatory Visit (HOSPITAL_COMMUNITY)
Admission: EM | Admit: 2023-06-08 | Discharge: 2023-06-08 | Disposition: A | Discharge status: Home / Self Care | Payer: Medicaid Other | Attending: Family Medicine | Admitting: Family Medicine

## 2023-06-08 DIAGNOSIS — R110 Nausea: Secondary | ICD-10-CM | POA: Diagnosis not present

## 2023-06-08 DIAGNOSIS — R197 Diarrhea, unspecified: Secondary | ICD-10-CM | POA: Diagnosis not present

## 2023-06-08 DIAGNOSIS — R1033 Periumbilical pain: Secondary | ICD-10-CM

## 2023-06-08 MED ORDER — ONDANSETRON 4 MG PO TBDP
ORAL_TABLET | ORAL | Status: AC
  Filled 2023-06-08: qty 1

## 2023-06-08 MED ORDER — ONDANSETRON 4 MG PO TBDP
4.0000 mg | ORAL_TABLET | Freq: Once | ORAL | Status: AC
  Administered 2023-06-08: 4 mg via ORAL

## 2023-06-08 MED ORDER — ONDANSETRON HCL 4 MG PO TABS: 4.0000 mg | ORAL_TABLET | Freq: Four times a day (QID) | ORAL | 0 refills | Status: DC

## 2023-06-08 NOTE — ED Triage Notes (Addendum)
Pt c/o abdominal pain all over with diarrhea and nausea that started today around 10 after eating at [employer]. Denies taking any meds. States diarrhea x4.

## 2023-06-08 NOTE — Discharge Instructions (Addendum)
You were seen today for abdominal pain, diarrhea and nausea.  I have given you zofran today to help with nausea, and sent a script to your pharmacy.  As discussed, this may be due to the breakfast this morning, or a combination of this with your increased dose of medication.  There is nothing emergent at this time.  I recommend you eat small amounts of bland foods, and drinks small amounts of clear liquids at a time.  If you have worsening pain, then please go to the ER for further evaluation.

## 2023-06-08 NOTE — ED Provider Notes (Signed)
[facility]    CSN: [id number] Arrival date & time: 06/08/23  1140      History   Chief Complaint Chief Complaint  Patient presents with   Abdominal Pain    HPI Ruth [name] is a 47 y.o. female.    Abdominal Pain Associated symptoms: diarrhea and nausea    Patient is here for stomach pain that started today.  7/10 in pain, all over her abdomen.  + nausea, no vomiting. Diarrhea x 4 today.  No blood in her stool.   No other sick contacts.  She did eat sausage, eggs and grits this morning, and pain started about afterward.  She got the food from [employer], and not sure if cooked all the way.  No fevers.        Past Medical History:  Diagnosis Date   Abnormal uterine bleeding 2023   Anemia 2023   Anxiety    Arthritis    chronic back pain    Asthma    rarely uses inhaler   Bipolar 1 disorder (HCC)    Follows w/ PCP Dr. Georganna Skeans.   Fibromyalgia    GERD (gastroesophageal reflux disease)    Hidradenitis    multiple boils, axilla and groin (As of 08/03/22, patient has a boil in her left axilla.)   HLD (hyperlipidemia)    diet controlled   Hx of migraines    occ   Hypertension    no meds   Hypothyroidism    Intentional drug overdose (HCC) 03/28/2021   Obesity    Sleep apnea    Patient states that she had a sleep study in the past and was told she did not need a CPAP.   Thrombocytosis 01/26/2020   Type 2 diabetes mellitus (HCC)    Type 2, Follows w/ PCP Dr. Georganna Skeans.   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   BMI 45.0-49.9, adult (HCC) 09/04/2020   Right arm pain 02/14/2020   Hyperlipidemia associated with type 2 diabetes mellitus (HCC) 09/19/2019   Abnormal uterine bleeding (AUB) 12/07/2017   Abnormal mammogram of both breasts 11/02/2016   Hypothyroidism 06/21/2009   Type 2 diabetes mellitus with peripheral neuropathy  (HCC) 06/21/2009   BIPOLAR AFFECTIVE DISORDER 06/21/2009   PERSISTENT DISORDER INITIATING/MAINTAINING SLEEP 06/21/2009   INADEQUATE SLEEP HYGIENE 06/21/2009   OBSTRUCTIVE SLEEP APNEA 06/21/2009   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   CARPAL TUNNEL SYNDROME 06/20/2009   MERALGIA PARESTHETICA 06/20/2009   Essential hypertension 06/20/2009   FIBROMYALGIA 06/20/2009   Headache 06/20/2009    Past Surgical History:  Procedure Laterality Date   ANTERIOR CERVICAL DECOMP/DISCECTOMY FUSION N/A 01/16/2021   Procedure: Cervical five-six Anterior cervical decompression/discectomy/fusion;  Surgeon: Lisbeth Renshaw, MD;  Location: [facility] OR;  Service: Neurosurgery;  Laterality: N/A;   CARPAL TUNNEL RELEASE Right    around 2009   CESAREAN SECTION     x2, 2003 & 2008   CYSTOSCOPY N/A 08/12/2022   Procedure: CYSTOSCOPY;  Surgeon: Lorriane Shire, MD;  Location: [facility];  Service: Gynecology;  Laterality: N/A;   DILITATION & CURRETTAGE/HYSTROSCOPY WITH NOVASURE ABLATION N/A 12/07/2017   Procedure: DILATATION & CURETTAGE/HYSTEROSCOPY WITH ATTEMPED NOVASURE ABLATION, FAILED/ENDOMETERIAL ABLATION WITH HTA SYSTEM;  Surgeon: Willodean Rosenthal, MD;  Location: [facility];  Service: Gynecology;  Laterality: N/A;   ELBOW SURGERY     "surgery for nerve pain" around 2009   ENDOMETRIAL  BIOPSY  07/23/2022   negative for hyperplasia or malignancy   GANGLION CYST EXCISION Right    right arm    RECTOVAGINAL FISTULA CLOSURE     in [id number], 2006   ROBOTIC ASSISTED TOTAL HYSTERECTOMY Bilateral 08/12/2022   Procedure: XI ROBOTIC ASSISTED TOTAL HYSTERECTOMY WITH SALPINGECTOMY;  Surgeon: Lorriane Shire, MD;  Location: [facility];  Service: Gynecology;  Laterality: Bilateral;   TONSILLECTOMY     childhood   TUBAL LIGATION  2008   XI ROBOT ASSISTED RECTOPEXY N/A 08/12/2022   Procedure: XI robot repair serosal repair of rectum;  Surgeon: Karie Soda, MD;  Location: [facility];  Service: General;  Laterality: N/A;    OB History     Gravida  7   Para  5   Term  4   Preterm  1   AB  2   Living  5      SAB  2   IAB      Ectopic      Multiple      Live Births  5        Obstetric Comments  Vag x 2>c/s emergency>vbac>c/s and BTL 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 Authorizing Provider  albuterol (PROVENTIL) (2.5 MG/3ML) 0.083% nebulizer solution Take 3 mLs (2.5 mg total) by nebulization every 6 (six) hours as needed for wheezing or shortness of breath. 02/01/23   Georganna Skeans, MD  albuterol (VENTOLIN HFA) 108 (90 Base) MCG/ACT inhaler Inhale 2 puffs into the lungs every 4 (four) hours as needed for wheezing or shortness of breath. 02/01/23   Georganna Skeans, MD  atorvastatin (LIPITOR) 20 MG tablet Take 1 tablet (20 mg total) by mouth daily. 05/19/23   Georganna Skeans, MD  celecoxib (CELEBREX) 100 MG capsule Take 1 capsule (100 mg total) by mouth 2 (two) times daily. 05/24/23 06/23/23  Louann Sjogren, DPM  cetirizine (ZYRTEC ALLERGY) 10 MG tablet Take 1 tablet (10 mg total) by mouth daily. 10/04/22 01/31/23  Garrison, [place] N, FNP  Continuous Glucose Receiver (DEXCOM G7 RECEIVER) DEVI Use to check blood sugar three times a day . 10/27/22   Georganna Skeans, MD  Continuous Glucose Sensor (DEXCOM G7 SENSOR) MISC Use to check blood sugar three time daily. Change sensors once every 10 days. 05/11/23 08/13/23  Georganna Skeans, MD  fluticasone [facility]) 50 MCG/ACT nasal spray Place 1 spray into both nostrils daily. 10/04/22   Garrison, [place] N, FNP  fluticasone (FLOVENT HFA) 110 MCG/ACT inhaler Inhale 2 puffs into the lungs 2 (two) times daily. 02/01/23   Georganna Skeans, MD  hydrOXYzine (ATARAX) 25 MG tablet Take 1 tablet (25 mg total) by mouth 3 (three) times daily as needed. 02/01/23   Georganna Skeans, MD  ibuprofen (ADVIL) 800 MG  tablet Take 1 tablet (800 mg total) by mouth 3 (three) times daily. 01/31/23   Raspet, Noberto Retort, PA-C  Iron, Ferrous Sulfate, 325 (65 Fe) MG TABS Take 325 mg by mouth daily. 05/14/23   Georganna Skeans, MD  meloxicam (MOBIC) 15 MG tablet Take 1 tablet (15 mg total) by mouth daily. 02/23/23   Louann Sjogren, DPM  montelukast (SINGULAIR) 10 MG tablet Take 1 tablet (10 mg total) by mouth at bedtime. 02/01/23   Georganna Skeans, MD  mupirocin ointment (BACTROBAN) 2 % Apply 1 Application topically 2 (  two) times daily. 01/31/23   Raspet, Noberto Retort, PA-C  nystatin cream (MYCOSTATIN) Apply 1 Application topically 2 (two) times daily. 11/27/22   [name], PA-C  OLANZapine (ZYPREXA) 5 MG tablet Take 1 tablet (5 mg total) by mouth at bedtime. 02/01/23   Georganna Skeans, MD  tirzepatide [facility]) 10 MG/0.5ML Pen Inject 10 mg into the skin once a week. 06/01/23   Hoy Register, MD  tirzepatide [facility]) 5 MG/0.5ML Pen Inject 5 mg into the skin once a week. For 4 weeks. Then, increase to the 7.5mg  dose. 06/01/23   Hoy Register, MD  tirzepatide [facility]) 7.5 MG/0.5ML Pen Inject 7.5 mg into the skin once a week. For 4 weeks. Then, increase to the 10mg  dose. 06/01/23   Hoy Register, MD    Family History Family History  Problem Relation Age of Onset   Thyroid disease Mother    Diabetes Daughter    Diabetes Son    Breast cancer Neg Hx     Social History Social History   Tobacco Use   Smoking status: Every Day    Current packs/day: 0.25    Types: Cigarettes   Smokeless tobacco: Never   Tobacco comments:    Per patient, she only smokes 2 to 3 cigarettes daily as of 08/03/22.  Vaping Use   Vaping status: Never Used  Substance Use Topics   Alcohol use: Not Currently   Drug use: Yes    Types: Marijuana    Comment: Occasional use only     Allergies   Metformin and related, Penicillins, and Victoza [liraglutide]   Review of Systems Review of Systems  Constitutional: Negative.   HENT:  Negative.    Respiratory: Negative.    Cardiovascular: Negative.   Gastrointestinal:  Positive for abdominal pain, diarrhea and nausea.  Musculoskeletal: Negative.   Skin: Negative.   Psychiatric/Behavioral: Negative.       Physical Exam Triage Vital Signs ED Triage Vitals  Encounter Vitals Group     BP 06/08/23 1223 121/87     Systolic BP Percentile --      Diastolic BP Percentile --      Pulse Rate 06/08/23 1223 85     Resp 06/08/23 1223 18     Temp 06/08/23 1223 97.7 F (36.5 C)     Temp Source 06/08/23 1223 Oral     SpO2 06/08/23 1223 98 %     Weight --      Height --      Head Circumference --      Peak Flow --      Pain Score 06/08/23 1225 7     Pain Loc --      Pain Education --      Exclude from Growth Chart --    No data found.  Updated Vital Signs BP 121/87 (BP Location: Left Arm)   Pulse 85   Temp 97.7 F (36.5 C) (Oral)   Resp 18   LMP 07/05/2022   SpO2 98%   Visual Acuity Right Eye Distance:   Left Eye Distance:   Bilateral Distance:    Right Eye Near:   Left Eye Near:    Bilateral Near:     Physical Exam Constitutional:      General: She is not in acute distress.    Appearance: She is well-developed. She is not ill-appearing.  Cardiovascular:     Rate and Rhythm: Normal rate and regular rhythm.  Pulmonary:     Effort: Pulmonary effort is normal.  Breath sounds: Normal breath sounds.  Abdominal:     General: Abdomen is flat. Bowel sounds are increased.     Palpations: Abdomen is soft.     Tenderness: There is abdominal tenderness in the epigastric area and periumbilical area. There is no guarding or rebound.  Skin:    General: Skin is warm.  Neurological:     Mental Status: She is alert.      UC Treatments / Results  Labs (all labs ordered are listed, but only abnormal results are displayed) Labs Reviewed - No data to display  EKG   Radiology No results found.  Procedures Procedures (including critical care  time)  Medications Ordered in UC Medications  ondansetron (ZOFRAN-ODT) disintegrating tablet 4 mg (4 mg Oral Given 06/08/23 1241)    Initial Impression / Assessment and Plan / UC Course  I have reviewed the triage vital signs and the nursing notes.  Pertinent labs & imaging results that were available during my care of the patient were reviewed by me and considered in my medical decision making (see chart for details).   Final Clinical Impressions(s) / UC Diagnoses   Final diagnoses:  Periumbilical abdominal pain  Diarrhea, unspecified type  Nausea without vomiting     Discharge Instructions      You were seen today for abdominal pain, diarrhea and nausea.  I have given you zofran today to help with nausea, and sent a script to your pharmacy.  As discussed, this may be due to the breakfast this morning, or a combination of this with your increased dose of medication.  There is nothing emergent at this time.  I recommend you eat small amounts of bland foods, and drinks small amounts of clear liquids at a time.  If you have worsening pain, then please go to the ER for further evaluation.     ED Prescriptions     Medication Sig Dispense Auth. Provider   ondansetron (ZOFRAN) 4 MG tablet Take 1 tablet (4 mg total) by mouth every 6 (six) hours. 12 tablet Jannifer Franklin, MD      PDMP not reviewed this encounter.   Jannifer Franklin, MD 06/08/23 1245

## 2023-06-11 ENCOUNTER — Ambulatory Visit: Payer: Medicaid Other | Admitting: Physical Therapy

## 2023-06-18 ENCOUNTER — Ambulatory Visit: Payer: Medicaid Other | Admitting: Physical Therapy

## 2023-06-21 ENCOUNTER — Other Ambulatory Visit: Payer: Self-pay | Admitting: Podiatry

## 2023-06-21 ENCOUNTER — Other Ambulatory Visit: Payer: Self-pay

## 2023-06-25 ENCOUNTER — Ambulatory Visit: Payer: Medicaid Other

## 2023-07-02 ENCOUNTER — Ambulatory Visit: Payer: Medicaid Other

## 2023-07-04 ENCOUNTER — Encounter (HOSPITAL_COMMUNITY): Payer: Self-pay

## 2023-07-04 ENCOUNTER — Ambulatory Visit (HOSPITAL_COMMUNITY)
Admission: EM | Admit: 2023-07-04 | Discharge: 2023-07-04 | Disposition: A | Discharge status: Home / Self Care | Payer: Medicaid Other | Attending: Internal Medicine | Admitting: Internal Medicine

## 2023-07-04 DIAGNOSIS — T23162A Burn of first degree of back of left hand, initial encounter: Secondary | ICD-10-CM | POA: Diagnosis not present

## 2023-07-04 MED ORDER — SILVER SULFADIAZINE 1 % EX CREA
TOPICAL_CREAM | CUTANEOUS | Status: AC
  Filled 2023-07-04: qty 85

## 2023-07-04 MED ORDER — SILVER SULFADIAZINE 1 % EX CREA: 1.0000 | TOPICAL_CREAM | Freq: Every day | CUTANEOUS | 0 refills | Status: DC

## 2023-07-04 NOTE — Discharge Instructions (Addendum)
1st degree burn of the left dorsal hand. Area does not appear to be infected and appears to be healing well.  We will treat with the following:  Silvadene cream: Apply 2-3 times daily to the left hand, cover when at work otherwise may leave open to air. After the area is completely healed, may start to use ScarAway silicone strips or gel as directed on the package.  Return to urgent care or PCP if symptoms worsen or fail to resolve.

## 2023-07-04 NOTE — ED Notes (Signed)
Silvadene applied to hand burn, non-adherent pad, & wrapped with Kling bandage.

## 2023-07-04 NOTE — ED Provider Notes (Signed)
[facility]    CSN: [id number] Arrival date & time: 07/04/23  1005      History   Chief Complaint Chief Complaint  Patient presents with   Hand Burn    HPI Ruth [name] is a 47 y.o. female.   47 year old female who presents to urgent care with complaints of a burn to the left dorsal hand.  This happened on December 23 at work.  She was pulling food off the grill and hit her hand on the area.  She initially was putting Aquaphor on the area but then switched to Neosporin as it had a element in it for pain.  The area was small at first but she does think she hit it and now the area is somewhat bigger.  She developed some swelling in the area as well which prompted her visit to urgent care.  She denies fevers or chills.  She has not had any purulent drainage from the area.     Past Medical History:  Diagnosis Date   Abnormal uterine bleeding 2023   Anemia 2023   Anxiety    Arthritis    chronic back pain    Asthma    rarely uses inhaler   Bipolar 1 disorder (HCC)    Follows w/ PCP Dr. Georganna Skeans.   Fibromyalgia    GERD (gastroesophageal reflux disease)    Hidradenitis    multiple boils, axilla and groin (As of 08/03/22, patient has a boil in her left axilla.)   HLD (hyperlipidemia)    diet controlled   Hx of migraines    occ   Hypertension    no meds   Hypothyroidism    Intentional drug overdose (HCC) 03/28/2021   Obesity    Sleep apnea    Patient states that she had a sleep study in the past and was told she did not need a CPAP.   Thrombocytosis 01/26/2020   Type 2 diabetes mellitus (HCC)    Type 2, Follows w/ PCP Dr. Georganna Skeans.   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   BMI 45.0-49.9, adult (HCC) 09/04/2020   Right arm pain 02/14/2020   Hyperlipidemia associated with type 2 diabetes mellitus (HCC) 09/19/2019   Abnormal uterine bleeding  (AUB) 12/07/2017   Abnormal mammogram of both breasts 11/02/2016   Hypothyroidism 06/21/2009   Type 2 diabetes mellitus with peripheral neuropathy (HCC) 06/21/2009   BIPOLAR AFFECTIVE DISORDER 06/21/2009   PERSISTENT DISORDER INITIATING/MAINTAINING SLEEP 06/21/2009   INADEQUATE SLEEP HYGIENE 06/21/2009   OBSTRUCTIVE SLEEP APNEA 06/21/2009   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   CARPAL TUNNEL SYNDROME 06/20/2009   MERALGIA PARESTHETICA 06/20/2009   Essential hypertension 06/20/2009   FIBROMYALGIA 06/20/2009   Headache 06/20/2009    Past Surgical History:  Procedure Laterality Date   ANTERIOR CERVICAL DECOMP/DISCECTOMY FUSION N/A 01/16/2021   Procedure: Cervical five-six Anterior cervical decompression/discectomy/fusion;  Surgeon: Lisbeth Renshaw, MD;  Location: [facility] OR;  Service: Neurosurgery;  Laterality: N/A;   CARPAL TUNNEL RELEASE Right    around 2009   CESAREAN SECTION     x2, 2003 & 2008   CYSTOSCOPY N/A 08/12/2022   Procedure: CYSTOSCOPY;  Surgeon: Lorriane Shire, MD;  Location: [facility];  Service: Gynecology;  Laterality: N/A;   DILITATION & CURRETTAGE/HYSTROSCOPY WITH NOVASURE ABLATION N/A 12/07/2017   Procedure: DILATATION & CURETTAGE/HYSTEROSCOPY WITH ATTEMPED NOVASURE ABLATION, FAILED/ENDOMETERIAL ABLATION WITH HTA SYSTEM;  Surgeon: Willodean Rosenthal, MD;  Location: [facility];  Service: Gynecology;  Laterality: N/A;   ELBOW SURGERY     "surgery for nerve pain" around 2009   ENDOMETRIAL BIOPSY  07/23/2022   negative for hyperplasia or malignancy   GANGLION CYST EXCISION Right    right arm    RECTOVAGINAL FISTULA CLOSURE     in [id number], 2006   ROBOTIC ASSISTED TOTAL HYSTERECTOMY Bilateral 08/12/2022   Procedure: XI ROBOTIC ASSISTED TOTAL HYSTERECTOMY WITH SALPINGECTOMY;  Surgeon: Lorriane Shire, MD;  Location:  [place];  Service: Gynecology;  Laterality: Bilateral;   TONSILLECTOMY     childhood   TUBAL LIGATION  2008   XI ROBOT  ASSISTED RECTOPEXY N/A 08/12/2022   Procedure: XI robot repair serosal repair of rectum;  Surgeon: Karie Soda, MD;  Location: [facility] ;  Service: General;  Laterality: N/A;    OB History     Gravida  7   Para  5   Term  4   Preterm  1   AB  2   Living  5      SAB  2   IAB      Ectopic      Multiple      Live Births  5        Obstetric Comments  Vag x 2>c/s emergency>vbac>c/s and BTL 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 Authorizing Provider  albuterol (PROVENTIL) (2.5 MG/3ML) 0.083% nebulizer solution Take 3 mLs (2.5 mg total) by nebulization every 6 (six) hours as needed for wheezing or shortness of breath. 02/01/23   Georganna Skeans, MD  albuterol (VENTOLIN HFA) 108 (90 Base) MCG/ACT inhaler Inhale 2 puffs into the lungs every 4 (four) hours as needed for wheezing or shortness of breath. 02/01/23   Georganna Skeans, MD  atorvastatin (LIPITOR) 20 MG tablet Take 1 tablet (20 mg total) by mouth daily. 05/19/23   Georganna Skeans, MD  celecoxib (CELEBREX) 100 MG capsule TAKE 1 CAPSULE BY MOUTH TWICE A DAY 06/21/23   Louann Sjogren, DPM  cetirizine (ZYRTEC ALLERGY) 10 MG tablet Take 1 tablet (10 mg total) by mouth daily. 10/04/22 01/31/23  Garrison, [place] N, FNP  Continuous Glucose Receiver (DEXCOM G7 RECEIVER) DEVI Use to check blood sugar three times a day . 10/27/22   Georganna Skeans, MD  Continuous Glucose Sensor (DEXCOM G7 SENSOR) MISC Use to check blood sugar three time daily. Change sensors once every 10 days. 05/11/23 08/13/23  Georganna Skeans, MD  fluticasone [facility]) 50 MCG/ACT nasal spray Place 1 spray into both nostrils daily. 10/04/22   Garrison, [place] N, FNP  fluticasone (FLOVENT HFA) 110 MCG/ACT inhaler Inhale 2 puffs into the lungs 2 (two) times daily. 02/01/23   Georganna Skeans, MD  hydrOXYzine (ATARAX) 25 MG tablet Take 1  tablet (25 mg total) by mouth 3 (three) times daily as needed. 02/01/23   Georganna Skeans, MD  ibuprofen (ADVIL) 800 MG tablet Take 1 tablet (800 mg total) by mouth 3 (three) times daily. 01/31/23   Raspet, Noberto Retort, PA-C  Iron, Ferrous Sulfate, 325 (65 Fe) MG TABS Take 325 mg by mouth daily. 05/14/23   Georganna Skeans, MD  meloxicam (MOBIC) 15 MG tablet Take 1 tablet (15 mg total) by mouth daily. 02/23/23   Louann Sjogren, DPM  montelukast (SINGULAIR) 10 MG tablet  Take 1 tablet (10 mg total) by mouth at bedtime. 02/01/23   Georganna Skeans, MD  mupirocin ointment (BACTROBAN) 2 % Apply 1 Application topically 2 (two) times daily. 01/31/23   Raspet, Noberto Retort, PA-C  nystatin cream (MYCOSTATIN) Apply 1 Application topically 2 (two) times daily. 11/27/22   [name], PA-C  OLANZapine (ZYPREXA) 5 MG tablet Take 1 tablet (5 mg total) by mouth at bedtime. 02/01/23   Georganna Skeans, MD  ondansetron (ZOFRAN) 4 MG tablet Take 1 tablet (4 mg total) by mouth every 6 (six) hours. 06/08/23   Piontek, Denny Peon, MD  tirzepatide [facility]) 10 MG/0.5ML Pen Inject 10 mg into the skin once a week. 06/01/23   Hoy Register, MD  tirzepatide [facility]) 5 MG/0.5ML Pen Inject 5 mg into the skin once a week. For 4 weeks. Then, increase to the 7.5mg  dose. 06/01/23   Hoy Register, MD  tirzepatide [facility]) 7.5 MG/0.5ML Pen Inject 7.5 mg into the skin once a week. For 4 weeks. Then, increase to the 10mg  dose. 06/01/23   Hoy Register, MD    Family History Family History  Problem Relation Age of Onset   Thyroid disease Mother    Diabetes Daughter    Diabetes Son    Breast cancer Neg Hx     Social History Social History   Tobacco Use   Smoking status: Every Day    Current packs/day: 0.25    Types: Cigarettes   Smokeless tobacco: Never   Tobacco comments:    Per patient, she only smokes 2 to 3 cigarettes daily as of 08/03/22.  Vaping Use   Vaping status: Never Used  Substance Use Topics   Alcohol use: Not  Currently   Drug use: Yes    Types: Marijuana    Comment: Occasional use only     Allergies   Metformin and related, Penicillins, and Victoza [liraglutide]   Review of Systems Review of Systems  Constitutional:  Negative for chills and fever.  HENT:  Negative for ear pain and sore throat.   Eyes:  Negative for pain and visual disturbance.  Respiratory:  Negative for cough and shortness of breath.   Cardiovascular:  Negative for chest pain and palpitations.  Gastrointestinal:  Negative for abdominal pain and vomiting.  Genitourinary:  Negative for dysuria and hematuria.  Musculoskeletal:  Negative for arthralgias and back pain.  Skin:  Positive for wound (burn on left hand). Negative for color change and rash.  Neurological:  Negative for seizures and syncope.  All other systems reviewed and are negative.    Physical Exam Triage Vital Signs ED Triage Vitals  Encounter Vitals Group     BP 07/04/23 1041 124/82     Systolic BP Percentile --      Diastolic BP Percentile --      Pulse Rate 07/04/23 1041 77     Resp 07/04/23 1041 18     Temp 07/04/23 1041 98.1 F (36.7 C)     Temp Source 07/04/23 1041 Oral     SpO2 07/04/23 1041 100 %     Weight 07/04/23 1039 240 lb (108.9 kg)     Height 07/04/23 1039 5\' 6"  (1.676 m)     Head Circumference --      Peak Flow --      Pain Score 07/04/23 1038 6     Pain Loc --      Pain Education --      Exclude from Growth Chart --    No  data found.  Updated Vital Signs BP 124/82 (BP Location: Left Arm)   Pulse 77   Temp 98.1 F (36.7 C) (Oral)   Resp 18   Ht 5\' 6"  (1.676 m)   Wt 240 lb (108.9 kg)   LMP 07/05/2022   SpO2 100%   BMI 38.74 kg/m   Visual Acuity Right Eye Distance:   Left Eye Distance:   Bilateral Distance:    Right Eye Near:   Left Eye Near:    Bilateral Near:     Physical Exam Vitals and nursing note reviewed.  Constitutional:      General: She is not in acute distress.    Appearance: She is  well-developed.  HENT:     Head: Normocephalic and atraumatic.  Eyes:     Conjunctiva/sclera: Conjunctivae normal.  Cardiovascular:     Rate and Rhythm: Normal rate and regular rhythm.     Heart sounds: No murmur heard. Pulmonary:     Effort: Pulmonary effort is normal. No respiratory distress.     Breath sounds: Normal breath sounds.  Abdominal:     Palpations: Abdomen is soft.     Tenderness: There is no abdominal tenderness.  Musculoskeletal:        General: No swelling.     Cervical back: Neck supple.  Skin:    General: Skin is warm and dry.     Capillary Refill: Capillary refill takes less than 2 seconds.       Neurological:     Mental Status: She is alert.  Psychiatric:        Mood and Affect: Mood normal.      UC Treatments / Results  Labs (all labs ordered are listed, but only abnormal results are displayed) Labs Reviewed - No data to display  EKG   Radiology No results found.  Procedures Procedures (including critical care time)  Medications Ordered in UC Medications - No data to display  Initial Impression / Assessment and Plan / UC Course  I have reviewed the triage vital signs and the nursing notes.  Pertinent labs & imaging results that were available during my care of the patient were reviewed by me and considered in my medical decision making (see chart for details).     Superficial burn of back of left hand, initial encounter   1st degree burn of the left dorsal hand. Area does not appear to be infected and appears to be healing well.  We will treat with the following:  Silvadene cream: Apply 2-3 times daily to the left hand, cover when at work otherwise may leave open to air. After the area is completely healed, may start to use ScarAway silicone strips or gel as directed on the package.  Return to urgent care or PCP if symptoms worsen or fail to resolve.    Final Clinical Impressions(s) / UC Diagnoses   Final diagnoses:  Superficial  burn of back of left hand, initial encounter     Discharge Instructions      1st degree burn of the left dorsal hand. Area does not appear to be infected and appears to be healing well.  We will treat with the following:  Silvadene cream: Apply 2-3 times daily to the left hand, cover when at work otherwise may leave open to air. After the area is completely healed, may start to use ScarAway silicone strips or gel as directed on the package.  Return to urgent care or PCP if symptoms worsen or fail to  resolve.     ED Prescriptions   None    PDMP not reviewed this encounter.   Landis Martins, [place] 07/04/23 1103

## 2023-07-04 NOTE — ED Triage Notes (Addendum)
Pt presents to urgent care with left hand burn (left fist area) "after taking food off of the grill and it hit my hand" on 12/24. Pt reports applying Aquaphor & Neosporin to area with no improvement. Pt currently rates her pain a 6/10.

## 2023-07-13 ENCOUNTER — Telehealth: Payer: Self-pay | Admitting: Podiatry

## 2023-07-13 ENCOUNTER — Encounter: Payer: Self-pay | Admitting: Podiatry

## 2023-07-13 NOTE — Telephone Encounter (Signed)
 Received call from [employer] ... She needed a letter for her employer for surgery and recovery times.  The surgery date was just scheduled for 08/10/2023 along with two POV appt's for 08/17/2023 and 08/31/2023, however, the approval for the surgery is not yet showing.  Provided a letter confirming the 08/10/2023 surgery date but not the estimated RTW date until Dr. Joya can evaluate the treated foot, post surgery.  E-mailed to [email or web address] .[email or web address]...       J. Abbott -- 07/13/2023

## 2023-07-14 ENCOUNTER — Telehealth: Payer: Self-pay | Admitting: Podiatry

## 2023-07-14 NOTE — Telephone Encounter (Signed)
 DOS-08/10/23  CALCANEAL OSTECTOMY [id number] SECONDARY ACHILLES TENDON REPAIR [id number]   [facility] EFFECTIVE DATE- 01/04/24  DEDUCTIBLE- $0.00 WITH REMAINING $0.00 OOP- Member's individual out-of-pocket maximum has no limit. COINSURANCE- 0%  PER THE [employer] WEBSITE, PRIOR AUTH HAS BEEN APPROVED FOR CPT CODES 71881 AND [phone]. GOOD FROM 08/10/23 - 09/03/23.  AUTH REF #: [id number]

## 2023-07-19 ENCOUNTER — Other Ambulatory Visit: Payer: Self-pay

## 2023-07-21 ENCOUNTER — Ambulatory Visit (HOSPITAL_COMMUNITY)
Admission: EM | Admit: 2023-07-21 | Discharge: 2023-07-21 | Disposition: A | Discharge status: Home / Self Care | Payer: Medicaid Other | Attending: Sports Medicine | Admitting: Sports Medicine

## 2023-07-21 ENCOUNTER — Other Ambulatory Visit: Payer: Self-pay

## 2023-07-21 ENCOUNTER — Encounter (HOSPITAL_COMMUNITY): Payer: Self-pay | Admitting: *Deleted

## 2023-07-21 DIAGNOSIS — B372 Candidiasis of skin and nail: Secondary | ICD-10-CM | POA: Diagnosis not present

## 2023-07-21 MED ORDER — MICONAZOLE NITRATE 2 % EX CREA: 1.0000 | TOPICAL_CREAM | Freq: Two times a day (BID) | CUTANEOUS | 0 refills | Status: DC

## 2023-07-21 MED ORDER — FLUCONAZOLE 200 MG PO TABS: 100.0000 mg | ORAL_TABLET | Freq: Every day | ORAL | 0 refills | Status: AC

## 2023-07-21 NOTE — ED Triage Notes (Signed)
 Pt presents with  itching under breast and ABD. Pt reports she has a yeast infection.

## 2023-07-21 NOTE — ED Provider Notes (Signed)
 [facility]    CSN: [id number] Arrival date & time: 07/21/23  0802      History   Chief Complaint Chief Complaint  Patient presents with   Skin Problem    HPI Ruth [name] is a 48 y.o. female.   Here for evaluation of skin rash present for the past few weeks. Describes it as itchy patches under skin folds of breasts and abdominal pannus. Has hx of cutaneous candidiasis that has difficulty clearing with topical antifungal therapy in the past, requiring diflucan  PO. Denies any spreading erythema or drainage from the skin lesions.     Past Medical History:  Diagnosis Date   Abnormal uterine bleeding 2023   Anemia 2023   Anxiety    Arthritis    chronic back pain    Asthma    rarely uses inhaler   Bipolar 1 disorder (HCC)    Follows w/ PCP Dr. Abraham Abo.   Fibromyalgia    GERD (gastroesophageal reflux disease)    Hidradenitis    multiple boils, axilla and groin (As of 08/03/22, patient has a boil in her left axilla.)   HLD (hyperlipidemia)    diet controlled   Hx of migraines    occ   Hypertension    no meds   Hypothyroidism    Intentional drug overdose (HCC) 03/28/2021   Obesity    Sleep apnea    Patient states that she had a sleep study in the past and was told she did not need a CPAP.   Thrombocytosis 01/26/2020   Type 2 diabetes mellitus (HCC)    Type 2, Follows w/ PCP Dr. Abraham Abo.   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   BMI 45.0-49.9, adult (HCC) 09/04/2020   Right arm pain 02/14/2020   Hyperlipidemia associated with type 2 diabetes mellitus (HCC) 09/19/2019   Abnormal uterine bleeding (AUB) 12/07/2017   Abnormal mammogram of both breasts 11/02/2016   Hypothyroidism 06/21/2009   Type 2 diabetes mellitus with peripheral neuropathy (HCC) 06/21/2009   BIPOLAR AFFECTIVE DISORDER 06/21/2009   PERSISTENT DISORDER  INITIATING/MAINTAINING SLEEP 06/21/2009   INADEQUATE SLEEP HYGIENE 06/21/2009   OBSTRUCTIVE SLEEP APNEA 06/21/2009   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   CARPAL TUNNEL SYNDROME 06/20/2009   MERALGIA PARESTHETICA 06/20/2009   Essential hypertension 06/20/2009   FIBROMYALGIA 06/20/2009   Headache 06/20/2009    Past Surgical History:  Procedure Laterality Date   ANTERIOR CERVICAL DECOMP/DISCECTOMY FUSION N/A 01/16/2021   Procedure: Cervical five-six Anterior cervical decompression/discectomy/fusion;  Surgeon: Augusto Blonder, MD;  Location: [facility] OR;  Service: Neurosurgery;  Laterality: N/A;   CARPAL TUNNEL RELEASE Right    around 2009   CESAREAN SECTION     x2, 2003 & 2008   CYSTOSCOPY N/A 08/12/2022   Procedure: CYSTOSCOPY;  Surgeon: Kiki Pelton, MD;  Location: [facility];  Service: Gynecology;  Laterality: N/A;   DILITATION & CURRETTAGE/HYSTROSCOPY WITH NOVASURE ABLATION N/A 12/07/2017   Procedure: DILATATION & CURETTAGE/HYSTEROSCOPY WITH ATTEMPED NOVASURE ABLATION, FAILED/ENDOMETERIAL ABLATION WITH HTA SYSTEM;  Surgeon: Lenord Radon, MD;  Location: [facility]S;  Service: Gynecology;  Laterality: N/A;   ELBOW SURGERY     "surgery for nerve pain" around 2009   ENDOMETRIAL BIOPSY  07/23/2022   negative for hyperplasia or malignancy   GANGLION CYST EXCISION Right    right arm    RECTOVAGINAL FISTULA CLOSURE     in [id number],  2006   ROBOTIC ASSISTED TOTAL HYSTERECTOMY Bilateral 08/12/2022   Procedure: XI ROBOTIC ASSISTED TOTAL HYSTERECTOMY WITH SALPINGECTOMY;  Surgeon: Kiki Pelton, MD;  Location: [facility];  Service: Gynecology;  Laterality: Bilateral;   TONSILLECTOMY     childhood   TUBAL LIGATION  2008   XI ROBOT ASSISTED RECTOPEXY N/A 08/12/2022   Procedure: XI robot repair serosal repair of rectum;  Surgeon: Candyce Champagne, MD;  Location: [facility];  Service: General;  Laterality: N/A;    OB History     Gravida  7    Para  5   Term  4   Preterm  1   AB  2   Living  5      SAB  2   IAB      Ectopic      Multiple      Live Births  5        Obstetric Comments  Vag x 2>c/s emergency>vbac>c/s and BTL 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 Authorizing Provider  albuterol  (PROVENTIL ) (2.5 MG/3ML) 0.083% nebulizer solution Take 3 mLs (2.5 mg total) by nebulization every 6 (six) hours as needed for wheezing or shortness of breath. 02/01/23  Yes Abraham Abo, MD  albuterol  (VENTOLIN  HFA) [phone] Base) MCG/ACT inhaler Inhale 2 puffs into the lungs every 4 (four) hours as needed for wheezing or shortness of breath. 02/01/23  Yes Abraham Abo, MD  atorvastatin  (LIPITOR ) 20 MG tablet Take 1 tablet (20 mg total) by mouth daily. 05/19/23  Yes Abraham Abo, MD  celecoxib  (CELEBREX ) 100 MG capsule TAKE 1 CAPSULE BY MOUTH TWICE A DAY 06/21/23  Yes Jennefer Moats, DPM  Continuous Glucose Receiver (DEXCOM G7 RECEIVER) DEVI Use to check blood sugar three times a day . 10/27/22  Yes Abraham Abo, MD  Continuous Glucose Sensor (DEXCOM G7 SENSOR) MISC Use to check blood sugar three time daily. Change sensors once every 10 days. 05/11/23 08/21/23 Yes Abraham Abo, MD  fluconazole  (DIFLUCAN ) 200 MG tablet Take 0.5 tablets (100 mg total) by mouth daily for 7 days. 07/21/23 07/28/23 Yes Caston Coopersmith D, MD  fluticasone  (FLONASE ) 50 MCG/ACT nasal spray Place 1 spray into both nostrils daily. 10/04/22  Yes Garrison, [place]  N, FNP  fluticasone  (FLOVENT  HFA) 110 MCG/ACT inhaler Inhale 2 puffs into the lungs 2 (two) times daily. 02/01/23  Yes Abraham Abo, MD  hydrOXYzine  (ATARAX ) 25 MG tablet Take 1 tablet (25 mg total) by mouth 3 (three) times daily as needed. 02/01/23  Yes Abraham Abo, MD  ibuprofen  (ADVIL ) 800 MG tablet Take 1 tablet (800 mg total) by mouth 3 (three) times daily. 01/31/23   Yes Raspet, Erin K, PA-C  Iron , Ferrous Sulfate , 325 (65 Fe) MG TABS Take 325 mg by mouth daily. 05/14/23  Yes Abraham Abo, MD  meloxicam  (MOBIC ) 15 MG tablet Take 1 tablet (15 mg total) by mouth daily. 02/23/23  Yes Jennefer Moats, DPM  miconazole  (MICOTIN) 2 % cream Apply 1 Application topically 2 (two) times daily. 07/21/23  Yes Kloey Cazarez D, MD  montelukast  (SINGULAIR ) 10 MG tablet Take 1 tablet (10 mg total) by mouth at bedtime. 02/01/23  Yes Abraham Abo, MD  mupirocin  ointment (BACTROBAN ) 2 % Apply 1 Application topically 2 (two) times daily. 01/31/23  Yes Raspet, Erin K, PA-C  nystatin  cream (MYCOSTATIN )  Apply 1 Application topically 2 (two) times daily. 11/27/22  Yes Angelia Kelp, PA-C  OLANZapine  (ZYPREXA ) 5 MG tablet Take 1 tablet (5 mg total) by mouth at bedtime. 02/01/23  Yes Abraham Abo, MD  ondansetron  (ZOFRAN ) 4 MG tablet Take 1 tablet (4 mg total) by mouth every 6 (six) hours. 06/08/23  Yes Piontek, Erin, MD  silver  sulfADIAZINE  (SILVADENE ) 1 % cream Apply 1 Application topically daily. Apply 2-3 times daily to the left hand, cover when at work otherwise may leave open to air. 07/04/23  Yes White, Elizabeth A, PA-C  tirzepatide  (MOUNJARO ) 10 MG/0.5ML Pen Inject 10 mg into the skin once a week. 06/01/23  Yes Newlin, Enobong, MD  tirzepatide  (MOUNJARO ) 5 MG/0.5ML Pen Inject 5 mg into the skin once a week. For 4 weeks. Then, increase to the 7.5mg  dose. 06/01/23  Yes Newlin, Enobong, MD  tirzepatide  (MOUNJARO ) 7.5 MG/0.5ML Pen Inject 7.5 mg into the skin once a week. For 4 weeks. Then, increase to the 10mg  dose. 06/01/23  Yes Newlin, Enobong, MD  cetirizine  (ZYRTEC  ALLERGY) 10 MG tablet Take 1 tablet (10 mg total) by mouth daily. 10/04/22 01/31/23  Coletta Davidson, FNP    Family History Family History  Problem Relation Age of Onset   Thyroid  disease Mother    Diabetes Daughter    Diabetes Son    Breast cancer Neg Hx     Social History Social History   Tobacco  Use   Smoking status: Every Day    Current packs/day: 0.25    Types: Cigarettes   Smokeless tobacco: Never   Tobacco comments:    Per patient, she only smokes 2 to 3 cigarettes daily as of 08/03/22.  Vaping Use   Vaping status: Never Used  Substance Use Topics   Alcohol use: Not Currently   Drug use: Yes    Types: Marijuana    Comment: Occasional use only     Allergies   Metformin  and related, Penicillins, and Victoza [liraglutide]   Review of Systems Review of Systems   Physical Exam Triage Vital Signs ED Triage Vitals  Encounter Vitals Group     BP 07/21/23 0828 134/86     Systolic BP Percentile --      Diastolic BP Percentile --      Pulse Rate 07/21/23 0828 79     Resp 07/21/23 0828 20     Temp 07/21/23 0828 97.8 F (36.6 C)     Temp src --      SpO2 07/21/23 0828 99 %     Weight --      Height --      Head Circumference --      Peak Flow --      Pain Score 07/21/23 0826 0     Pain Loc --      Pain Education --      Exclude from Growth Chart --    No data found.  Updated Vital Signs BP 134/86   Pulse 79   Temp 97.8 F (36.6 C)   Resp 20   LMP 07/05/2022   SpO2 99%   Visual Acuity Right Eye Distance:   Left Eye Distance:   Bilateral Distance:    Right Eye Near:   Left Eye Near:    Bilateral Near:     Physical Exam Constitutional:      General: She is not in acute distress.    Appearance: She is obese. She is not ill-appearing.  Cardiovascular:  Rate and Rhythm: Normal rate.     Pulses: Normal pulses.  Pulmonary:     Effort: Pulmonary effort is normal.  Abdominal:     General: There is no distension.     Tenderness: There is no abdominal tenderness. There is no guarding.  Skin:    General: Skin is warm.     Coloration: Skin is not jaundiced.     Findings: Rash present. No bruising.     Comments: Patches of erythematous, well-demarcated rash with some overlaying maceration under both breast folds and midline abdominal pannus. Some  hyperpigmented scarring of pannus.  Neurological:     General: No focal deficit present.     Mental Status: She is alert and oriented to person, place, and time. Mental status is at baseline.  Psychiatric:        Mood and Affect: Mood normal.        Behavior: Behavior normal.      UC Treatments / Results  Labs (all labs ordered are listed, but only abnormal results are displayed) Labs Reviewed - No data to display  EKG   Radiology No results found.  Procedures Procedures (including critical care time)  Medications Ordered in UC Medications - No data to display  Initial Impression / Assessment and Plan / UC Course  I have reviewed the triage vital signs and the nursing notes.  Pertinent labs & imaging results that were available during my care of the patient were reviewed by me and considered in my medical decision making (see chart for details).    Vitals and triage reviewed, patient is hemodynamically stable. Presentation consistent with cutaneous yeast infection. No complicating features. Recommended treatment with topical Miconazole  and PO Diflucan , Rx provided. Reviewed preventative measures. Patient's questions were answered and they are in agreement with this plan.  Final Clinical Impressions(s) / UC Diagnoses   Final diagnoses:  Cutaneous candidiasis     Discharge Instructions      You have a yeast infection of the skin underneath your breasts and abdominal folds.  Treatment Plan:  Miconazole  cream: Apply two to three times daily until the infection resolves. This antifungal cream is effective and well-tolerated.  Oral Fluconazole : Take a full tablet on day one, then half a tablet per day for 7 days  Preventions in the future:  Keep the affected area clean and dry.  Wear loose-fitting, breathable clothing.  Avoid using harsh soaps or irritants on the affected area.   ED Prescriptions     Medication Sig Dispense Auth. Provider   fluconazole   (DIFLUCAN ) 200 MG tablet Take 0.5 tablets (100 mg total) by mouth daily for 7 days. 4 tablet Paitlyn Mcclatchey D, MD   miconazole  (MICOTIN) 2 % cream Apply 1 Application topically 2 (two) times daily. 60 g Devine Dant D, MD      PDMP not reviewed this encounter.   Marliss Simple, MD 07/21/23 [phone]

## 2023-07-21 NOTE — Discharge Instructions (Addendum)
 You have a yeast infection of the skin underneath your breasts and abdominal folds.  Treatment Plan:  Miconazole  cream: Apply two to three times daily until the infection resolves. This antifungal cream is effective and well-tolerated.  Oral Fluconazole : Take a full tablet on day one, then half a tablet per day for 7 days  Preventions in the future:  Keep the affected area clean and dry.  Wear loose-fitting, breathable clothing.  Avoid using harsh soaps or irritants on the affected area.

## 2023-07-29 ENCOUNTER — Other Ambulatory Visit: Payer: Self-pay

## 2023-07-29 ENCOUNTER — Encounter: Payer: Self-pay | Admitting: Family Medicine

## 2023-07-29 ENCOUNTER — Ambulatory Visit: Payer: Medicaid Other | Attending: Family Medicine | Admitting: Pharmacist

## 2023-07-29 ENCOUNTER — Encounter: Payer: Self-pay | Admitting: Pharmacist

## 2023-07-29 DIAGNOSIS — E1142 Type 2 diabetes mellitus with diabetic polyneuropathy: Secondary | ICD-10-CM | POA: Diagnosis not present

## 2023-07-29 DIAGNOSIS — Z7985 Long-term (current) use of injectable non-insulin antidiabetic drugs: Secondary | ICD-10-CM | POA: Diagnosis not present

## 2023-07-29 LAB — POCT GLYCOSYLATED HEMOGLOBIN (HGB A1C): HbA1c, POC (controlled diabetic range): 6 % (ref 0.0–7.0)

## 2023-07-29 MED ORDER — TIRZEPATIDE 12.5 MG/0.5ML ~~LOC~~ SOAJ
12.5000 mg | SUBCUTANEOUS | 0 refills | Status: DC
  Filled 2023-07-29 – 2023-08-31 (×2): qty 2, 28d supply, fill #0

## 2023-07-29 MED ORDER — TIRZEPATIDE 15 MG/0.5ML ~~LOC~~ SOAJ
15.0000 mg | SUBCUTANEOUS | 1 refills | Status: DC
  Filled 2023-07-29: qty 2, 28d supply, fill #0

## 2023-07-29 NOTE — Progress Notes (Signed)
S:     No chief complaint on file.  48 y.o. female who presents for diabetes evaluation, education, and management. PMH is significant for T2DM and HLD.   Patient was referred and last seen by Primary Care Provider, Dr. Andrey Campanile, on 05/14/2023. Patient was last seen by pharmacy on 06/01/2023. At that visit, we continued to titrate her Mounjaro.    Today, patient arrives in good spirits and presents without  any assistance. Patient denies N/V or abdominal pain with Mounjaro. Her A1c today shows continued good glycemic control. Patient has no concerns today. Patient desires continued Mounjaro titration to help achieve weigh loss goals. Of note, she tells me today that she has a foot procedure coming up on 08/10/2023. She has been advised by the proceduralist to hold [facility] for 1 week before her operation.    Family/Social History:  DM- daughter, brother, sister  No family or personal history of thyroid cancer (mother has history of hyperthyroidism requiring radiation).   Current diabetes medications include: Mounjaro 10 mg weekly  Patient reports adherence to taking all medications as prescribed.   Insurance coverage: Medicaid   Patient denies hypoglycemic events.  Patient denies nocturia (nighttime urination).  Patient reports neuropathy (nerve pain). Patient denies visual changes. Patient reports self foot exams.   Dietary habits: 2 meals a day -Appetite  -Has increased her water intake since last visit. Still will drink regular soda occasionally.  -snack: sandwiches, tacos   Exercise:  -Tries to do some walking (limited due to pain)  O:   Lab Results  Component Value Date   HGBA1C 6.2 04/27/2023   There were no vitals filed for this visit.  Lipid Panel     Component Value Date/Time   CHOL 213 (H) 05/14/2023 1048   TRIG 93 05/14/2023 1048   HDL 43 05/14/2023 1048   CHOLHDL 5.0 (H) 05/14/2023 1048   LDLCALC 153 (H) 05/14/2023 1048    Clinical Atherosclerotic  Cardiovascular Disease (ASCVD): No  The 10-year ASCVD risk score (Arnett DK, et al., 2019) is: 14.3%   Values used to calculate the score:     Age: 36 years     Sex: Female     Is Non-Hispanic African American: Yes     Diabetic: Yes     Tobacco smoker: Yes     Systolic Blood Pressure: 134 mmHg     Is BP treated: No     HDL Cholesterol: 43 mg/dL     Total Cholesterol: 213 mg/dL   Patient is participating in a Managed Medicaid Plan:  Yes   A/P: Diabetes longstanding currently controlled based on A1c of 6.0% today. Commended her for this. We will continue to titrate [facility] for weight loss effect. She has tolerated this well up to this point. She has been instructed to skip her Mounjaro dose due on 08/03/23. She will start a week after her procedure on 08/17/2023.  Patient is able to verbalize appropriate hypoglycemia management plan. Medication adherence appears optimal.   -Continue Mounjaro 10 mg weekly for now. Skip next week's dose and the dose that you would have taken on 08/10/2023 (procedure day). Resume on 08/17/2023. Rxns sent for the 12.5 mg and 15 mg doses for her to complete and eventually transition to for weight loss effect. -Patient educated on purpose, proper use, and potential adverse effects of medications.  -Extensively discussed pathophysiology of diabetes, recommended lifestyle interventions, dietary effects on blood sugar control.  -Counseled on s/sx of and management of hypoglycemia.  -  Next A1c anticipated 10/2023.   Written patient instructions provided. Patient verbalized understanding of treatment plan.  Total time in face to face counseling 30 minutes.    Follow-up:  Pharmacist: 2 months.   Butch Penny, PharmD, Patsy Baltimore, CPP Clinical Pharmacist [facility] [phone]

## 2023-08-09 ENCOUNTER — Telehealth: Payer: Self-pay | Admitting: Family Medicine

## 2023-08-09 ENCOUNTER — Telehealth: Payer: Self-pay | Admitting: Podiatry

## 2023-08-09 NOTE — Patient Instructions (Signed)
SURGICAL WAITING ROOM VISITATION Patients having surgery or a procedure may have no more than 2 support people in the waiting area - these visitors may rotate in the visitor waiting room.   Due to an increase in RSV and influenza rates and associated hospitalizations, children ages 63 and under may not visit patients in [facility] hospitals. If the patient needs to stay at the hospital during part of their recovery, the visitor guidelines for inpatient rooms apply.  PRE-OP VISITATION  Pre-op nurse will coordinate an appropriate time for 1 support person to accompany the patient in pre-op.  This support person may not rotate.  This visitor will be contacted when the time is appropriate for the visitor to come back in the pre-op area.  Please refer to the [facility] website for the visitor guidelines for Inpatients (after your surgery is over and you are in a regular room).  You are not required to quarantine at this time prior to your surgery. However, you must do this: Hand Hygiene often Do NOT share personal items Notify your provider if you are in close contact with someone who has COVID or you develop fever 100.4 or greater, new onset of sneezing, cough, sore throat, shortness of breath or body aches.  If you test positive for Covid or have been in contact with anyone that has tested positive in the last 10 days please notify you surgeon.    Your procedure is scheduled on:  FRIDAY  August 13, 2023  Report to [facility] Main Entrance: Leota [facility]sen entrance where the [employer] is available.   Report to admitting at: 12:45  PM  Call this number if you have any questions or problems the morning of surgery [phone]  Do not eat food after Midnight the night prior to your surgery/procedure.  After Midnight you may have the following liquids until   ???? 12:00 PM DAY OF SURGERY  Clear Liquid Diet Water Black Coffee (sugar ok, NO MILK/CREAM OR CREAMERS)  Tea (sugar ok, NO  MILK/CREAM OR CREAMERS) regular and decaf                             Plain Jell-O  with no fruit (NO RED)                                           Fruit ices (not with fruit pulp, NO RED)                                     Popsicles (NO RED)                                                                  Juice: NO CITRUS JUICES: only apple, WHITE grape, WHITE cranberry Sports drinks like Gatorade or Powerade (NO RED)                   The day of surgery:  Drink ONE (1) Pre-Surgery G2 at  12:00  PM the morning of surgery. Drink  in one sitting. Do not sip.  This drink was given to you during your hospital pre-op appointment visit. Nothing else to drink after completing the Pre-Surgery G2 : No candy, chewing gum or throat lozenges.    FOLLOW ANY ADDITIONAL PRE OP INSTRUCTIONS YOU RECEIVED FROM YOUR SURGEON'S OFFICE!!!   Oral Hygiene is also important to reduce your risk of infection.        Remember - BRUSH YOUR TEETH THE MORNING OF SURGERY WITH YOUR REGULAR TOOTHPASTE  Do NOT smoke after Midnight the night before surgery.  STOP TAKING all Vitamins, Herbs and supplements 1 week before your surgery.   Take ONLY these medicines the morning of surgery with A SIP OF WATER: none,  You may use your Flonase nasal spray and your Albuterol inhaler/ nebulizer if needed.                    You may not have any metal on your body including hair pins, jewelry, and body piercing  Do not wear make-up, lotions, powders, perfumes or deodorant  Do not wear nail polish including gel and S&S, artificial / acrylic nails, or any other type of covering on natural nails including finger and toenails. If you have artificial nails, gel coating, etc., that needs to be removed by a nail salon, Please have this removed prior to surgery. Not doing so may mean that your surgery could be cancelled or delayed if the Surgeon or anesthesia staff feels like they are unable to monitor you safely.   Do not shave 48  hours prior to surgery to avoid nicks in your skin which may contribute to postoperative infections.   Contacts, Hearing Aids, dentures or bridgework may not be worn into surgery. DENTURES WILL BE REMOVED PRIOR TO SURGERY PLEASE DO NOT APPLY "Poly grip" OR ADHESIVES!!!  Patients discharged on the day of surgery will not be allowed to drive home.  Someone NEEDS to stay with you for the first 24 hours after anesthesia.  Do not bring your home medications to the hospital. The Pharmacy will dispense medications listed on your medication list to you during your admission in the Hospital.  Please read over the following fact sheets you were given: IF YOU HAVE QUESTIONS ABOUT YOUR PRE-OP INSTRUCTIONS, PLEASE CALL [phone]   [facility] - Preparing for Surgery Before surgery, you can play an important role.  Because skin is not sterile, your skin needs to be as free of germs as possible.  You can reduce the number of germs on your skin by washing with CHG (chlorahexidine gluconate) soap before surgery.  CHG is an antiseptic cleaner which kills germs and bonds with the skin to continue killing germs even after washing. Please DO NOT use if you have an allergy to CHG or antibacterial soaps.  If your skin becomes reddened/irritated stop using the CHG and inform your nurse when you arrive at Short Stay. Do not shave (including legs and underarms) for at least 48 hours prior to the first CHG shower.  You may shave your face/neck.  Please follow these instructions carefully:  1.  Shower with CHG Soap the night before surgery and the  morning of surgery.  2.  If you choose to wash your hair, wash your hair first as usual with your normal  shampoo.  3.  After you shampoo, rinse your hair and body thoroughly to remove the shampoo.  4.  Use CHG as you would any other liquid soap.  You can apply chg directly to the skin and wash.  Gently with a scrungie or clean washcloth.  5.  Apply  the CHG Soap to your body ONLY FROM THE NECK DOWN.   Do not use on face/ open                           Wound or open sores. Avoid contact with eyes, ears mouth and genitals (private parts).                       Wash face,  Genitals (private parts) with your normal soap.             6.  Wash thoroughly, paying special attention to the area where your  surgery  will be performed.  7.  Thoroughly rinse your body with warm water from the neck down.  8.  DO NOT shower/wash with your normal soap after using and rinsing off the CHG Soap.            9.  Pat yourself dry with a clean towel.            10.  Wear clean pajamas.            11.  Place clean sheets on your bed the night of your first shower and do not  sleep with pets.  ON THE DAY OF SURGERY : Do not apply any lotions/deodorants the morning of surgery.  Please wear clean clothes to the hospital/surgery center.     FAILURE TO FOLLOW THESE INSTRUCTIONS MAY RESULT IN THE CANCELLATION OF YOUR SURGERY  PATIENT SIGNATURE_________________________________  NURSE SIGNATURE__________________________________  ________________________________________________________________________

## 2023-08-09 NOTE — Telephone Encounter (Signed)
A document form has been faxed: Surgical Clearance, to be filled out by provider. Send document back via Fax within 5-days. Document is located in providers tray at front office.           Fax number:  [phone]    Appt scheduled: 02/04 2:40pm

## 2023-08-09 NOTE — Telephone Encounter (Signed)
Form at [facility] desk

## 2023-08-09 NOTE — Progress Notes (Addendum)
 COVID Vaccine received:  []  No []  Yes Date of any COVID positive Test in last 90 days:  PCP - Raguel Blush, MD  Cardiologist -   Chest x-ray - 01-27-2023  1v   Epic EKG -  01-27-2023  Epic Stress Test -  ECHO -  Cardiac Cath -   PCR screen: []  Ordered & Completed []   No Order but Needs PROFEND     [x]   N/A for this surgery  Surgery Plan:  [x]  Ambulatory   []  Outpatient in bed  []  Admit Anesthesia:    []  General  []  Spinal  [x]   Choice []   MAC  Pacemaker / ICD device [x]  No []  Yes   Spinal Cord Stimulator:[x]  No []  Yes       History of Sleep Apnea? []  No [x]  Yes   CPAP used?- [x]  No []  Yes    Does the patient monitor blood sugar?   []  N/A   []  No [x]  Yes  Patient has: []  NO Hx DM   []  Pre-DM   []  DM1  [x]   DM2 Last A1c was: 6.0  on    07-29-23  Does patient have a [employer] or Dexcom? []  No [x]  Yes   Fasting Blood Sugar Ranges- 100-120 Checks Blood Sugar _contnuous times a day  GLP1 agonist / usual dose - Mounjaro ,  already on hold  Blood Thinner / Instructions:  none Aspirin  Instructions:    none  ERAS Protocol Ordered: []  No  []  Yes PRE-SURGERY []  ENSURE  []  G2   []  No Drink Ordered  Patient is to be NPO after:    NO ORDERS    but patient will be NPO at 0900 per Harlene Ward PA-C since she's diabetic.   Dental hx: []  Dentures:  [x]  N/A      []  Bridge or Partial:                   []  Loose or Damaged teeth:   Activity level: Patient is able / unable to climb a flight of stairs without difficulty; []  No CP  []  No SOB, but would have ___   Patient can / can not perform ADLs without assistance.   Anesthesia review: OSA-no CPAP, DM2, s/p ACDF C5-6 (01-16-2021), HTN, anemia, Bipolar- anxiety, smoker.   Patient denies shortness of breath, fever, cough and chest pain at PAT appointment.  Patient verbalized understanding and agreement to the Pre-Surgical Instructions that were given to them at this PAT appointment. Patient was also educated of the need to review these  PAT instructions again prior to her surgery.I reviewed the appropriate phone numbers to call if they have any and questions or concerns.

## 2023-08-09 NOTE — Telephone Encounter (Signed)
DOS-08/13/23  CALCANEAL OSTECTOMY [id number] SECONDARY ACHILLES TENDON REPAIR [id number]  [facility] EFFECTIVE DATE-01/04/23  DEDUCTIBLE- $0.00 OOP- Member's individual out-of-pocket maximum has no limit. COINSURANCE- 0%  PER THE [employer] PORTAL,PRIOR AUTH HAS BEEN APPROVED FOR CPT CODES 25366 AND [phone]. GOOD FROM 08/13/23- 11/11/23  AUTH REF #: [id number]

## 2023-08-10 ENCOUNTER — Encounter (HOSPITAL_COMMUNITY): Payer: Self-pay

## 2023-08-10 ENCOUNTER — Ambulatory Visit (INDEPENDENT_AMBULATORY_CARE_PROVIDER_SITE_OTHER): Payer: Medicaid Other | Admitting: Family Medicine

## 2023-08-10 ENCOUNTER — Encounter (HOSPITAL_COMMUNITY)
Admission: RE | Admit: 2023-08-10 | Discharge: 2023-08-10 | Disposition: A | Discharge status: Home / Self Care | Payer: Medicaid Other | Source: Ambulatory Visit | Attending: Podiatry | Admitting: Podiatry

## 2023-08-10 ENCOUNTER — Other Ambulatory Visit: Payer: Self-pay

## 2023-08-10 ENCOUNTER — Encounter: Payer: Self-pay | Admitting: Family Medicine

## 2023-08-10 VITALS — BP 124/85 | HR 90 | Temp 97.7°F | Resp 16 | Ht 66.0 in | Wt 249.2 lb

## 2023-08-10 VITALS — BP 140/90 | HR 79 | Temp 97.9°F | Resp 22 | Ht 66.0 in | Wt 245.0 lb

## 2023-08-10 DIAGNOSIS — Z6841 Body Mass Index (BMI) 40.0 and over, adult: Secondary | ICD-10-CM

## 2023-08-10 DIAGNOSIS — E669 Obesity, unspecified: Secondary | ICD-10-CM

## 2023-08-10 DIAGNOSIS — Z5986 Financial insecurity: Secondary | ICD-10-CM

## 2023-08-10 DIAGNOSIS — I10 Essential (primary) hypertension: Secondary | ICD-10-CM | POA: Diagnosis not present

## 2023-08-10 DIAGNOSIS — E1142 Type 2 diabetes mellitus with diabetic polyneuropathy: Secondary | ICD-10-CM | POA: Diagnosis not present

## 2023-08-10 DIAGNOSIS — E66813 Obesity, class 3: Secondary | ICD-10-CM

## 2023-08-10 DIAGNOSIS — F17210 Nicotine dependence, cigarettes, uncomplicated: Secondary | ICD-10-CM

## 2023-08-10 DIAGNOSIS — Z7985 Long-term (current) use of injectable non-insulin antidiabetic drugs: Secondary | ICD-10-CM | POA: Diagnosis not present

## 2023-08-10 DIAGNOSIS — Z01812 Encounter for preprocedural laboratory examination: Secondary | ICD-10-CM | POA: Diagnosis not present

## 2023-08-10 DIAGNOSIS — Z01818 Encounter for other preprocedural examination: Secondary | ICD-10-CM | POA: Diagnosis present

## 2023-08-10 DIAGNOSIS — E1169 Type 2 diabetes mellitus with other specified complication: Secondary | ICD-10-CM | POA: Diagnosis not present

## 2023-08-10 DIAGNOSIS — Z5941 Food insecurity: Secondary | ICD-10-CM

## 2023-08-10 LAB — BASIC METABOLIC PANEL
Anion gap: 10 (ref 5–15)
BUN: 9 mg/dL (ref 6–20)
CO2: 24 mmol/L (ref 22–32)
Calcium: 8.8 mg/dL — ABNORMAL LOW (ref 8.9–10.3)
Chloride: 102 mmol/L (ref 98–111)
Creatinine, Ser: 0.67 mg/dL (ref 0.44–1.00)
GFR, Estimated: >60 mL/min (ref >60)
Glucose, Bld: 148 mg/dL — ABNORMAL HIGH (ref 70–99)
Potassium: 4.2 mmol/L (ref 3.5–5.1)
Sodium: 136 mmol/L (ref 135–145)

## 2023-08-10 LAB — CBC
HCT: 38.2 % (ref 36.0–46.0)
Hemoglobin: 11.9 g/dL — ABNORMAL LOW (ref 12.0–15.0)
MCH: 26.7 pg (ref 26.0–34.0)
MCHC: 31.2 g/dL (ref 30.0–36.0)
MCV: 85.8 fL (ref 80.0–100.0)
Platelets: 352 10*3/uL (ref 150–400)
RBC: 4.45 MIL/uL (ref 3.87–5.11)
RDW: 14.5 % (ref 11.5–15.5)
WBC: 5.5 10*3/uL (ref 4.0–10.5)
nRBC: 0 % (ref 0.0–0.2)

## 2023-08-10 LAB — GLUCOSE, CAPILLARY: Glucose-Capillary: 121 mg/dL — ABNORMAL HIGH (ref 70–99)

## 2023-08-11 ENCOUNTER — Encounter: Payer: Self-pay | Admitting: Family Medicine

## 2023-08-11 NOTE — Progress Notes (Addendum)
 Established Patient Office Visit  Subjective    Patient ID: Ruth [name], female    DOB: [birth year]  Age: 48 y.o. MRN: [id number]  CC:  Chief Complaint  Patient presents with   surgical cleaerance    HPI Ruth [name] presents for preoperative medical clearance for upcoming achilles repair.   Outpatient Encounter Medications as of 08/10/2023  Medication Sig   tirzepatide  (MOUNJARO ) 10 MG/0.5ML Pen Inject 10 mg into the skin once a week.   tirzepatide  (MOUNJARO ) 12.5 MG/0.5ML Pen Inject 12.5 mg into the skin once a week.   tirzepatide  (MOUNJARO ) 15 MG/0.5ML Pen Inject 15 mg into the skin once a week.   albuterol  (PROVENTIL ) (2.5 MG/3ML) 0.083% nebulizer solution Take 3 mLs (2.5 mg total) by nebulization every 6 (six) hours as needed for wheezing or shortness of breath.   albuterol  (VENTOLIN  HFA) 108 (90 Base) MCG/ACT inhaler Inhale 2 puffs into the lungs every 4 (four) hours as needed for wheezing or shortness of breath.   atorvastatin  (LIPITOR ) 20 MG tablet Take 1 tablet (20 mg total) by mouth daily.   celecoxib  (CELEBREX ) 100 MG capsule TAKE 1 CAPSULE BY MOUTH TWICE A DAY   cetirizine  (ZYRTEC  ALLERGY) 10 MG tablet Take 1 tablet (10 mg total) by mouth daily. (Patient taking differently: Take 10 mg by mouth daily as needed for allergies.)   Continuous Glucose Receiver (DEXCOM G7 RECEIVER) DEVI Use to check blood sugar three times a day .   Continuous Glucose Sensor (DEXCOM G7 SENSOR) MISC Use to check blood sugar three time daily. Change sensors once every 10 days.   fluticasone  (FLONASE ) 50 MCG/ACT nasal spray Place 1 spray into both nostrils daily. (Patient taking differently: Place 1 spray into both nostrils daily as needed for allergies or rhinitis.)   fluticasone  (FLOVENT  HFA) 110 MCG/ACT inhaler Inhale 2 puffs into the lungs 2 (two) times daily. (Patient not taking: Reported on 08/09/2023)   hydrOXYzine  (ATARAX ) 25 MG tablet Take 1 tablet (25 mg total) by  mouth 3 (three) times daily as needed. (Patient not taking: Reported on 08/10/2023)   ibuprofen  (ADVIL ) 800 MG tablet Take 1 tablet (800 mg total) by mouth 3 (three) times daily. (Patient not taking: Reported on 08/09/2023)   Iron , Ferrous Sulfate , 325 (65 Fe) MG TABS Take 325 mg by mouth daily. (Patient not taking: Reported on 08/09/2023)   meloxicam  (MOBIC ) 15 MG tablet Take 1 tablet (15 mg total) by mouth daily. (Patient not taking: Reported on 08/09/2023)   miconazole  (MICOTIN) 2 % cream Apply 1 Application topically 2 (two) times daily. (Patient not taking: Reported on 08/09/2023)   montelukast  (SINGULAIR ) 10 MG tablet Take 1 tablet (10 mg total) by mouth at bedtime.   mupirocin  ointment (BACTROBAN ) 2 % Apply 1 Application topically 2 (two) times daily. (Patient not taking: Reported on 08/09/2023)   nystatin  cream (MYCOSTATIN ) Apply 1 Application topically 2 (two) times daily. (Patient not taking: Reported on 08/09/2023)   OLANZapine  (ZYPREXA ) 5 MG tablet Take 1 tablet (5 mg total) by mouth at bedtime. (Patient not taking: Reported on 08/10/2023)   ondansetron  (ZOFRAN ) 4 MG tablet Take 1 tablet (4 mg total) by mouth every 6 (six) hours. (Patient not taking: Reported on 08/09/2023)   silver  sulfADIAZINE  (SILVADENE ) 1 % cream Apply 1 Application topically daily. Apply 2-3 times daily to the left hand, cover when at work otherwise may leave open to air. (Patient not taking: Reported on 08/09/2023)   No facility-administered encounter medications on file as of  08/10/2023.    Past Medical History:  Diagnosis Date   Abnormal uterine bleeding 2023   Anemia 2023   Anxiety    Arthritis    chronic back pain    Asthma    rarely uses inhaler   Bipolar 1 disorder (HCC)    Follows w/ PCP Dr. Raguel Blush.   Fibromyalgia    GERD (gastroesophageal reflux disease)    Headache    Hidradenitis    multiple boils, axilla and groin (As of 08/03/22, patient has a boil in her left axilla.)   HLD (hyperlipidemia)    diet  controlled   Hx of migraines    occ   Hypertension    no meds   Hypothyroidism    Intentional drug overdose (HCC) 03/28/2021   Obesity    Sleep apnea    Patient states that she had a sleep study in the past and was told she did not need a CPAP.   Thrombocytosis 01/26/2020   Type 2 diabetes mellitus (HCC)    Type 2, Follows w/ PCP Dr. Raguel Blush.   Wears glasses     Past Surgical History:  Procedure Laterality Date   ANTERIOR CERVICAL DECOMP/DISCECTOMY FUSION N/A 01/16/2021   Procedure: Cervical five-six Anterior cervical decompression/discectomy/fusion;  Surgeon: Lanis Pupa, MD;  Location: [facility] OR;  Service: Neurosurgery;  Laterality: N/A;   CARPAL TUNNEL RELEASE Right    around 2009   CESAREAN SECTION     x2, 2003 & 2008   CYSTOSCOPY N/A 08/12/2022   Procedure: CYSTOSCOPY;  Surgeon: Jeralyn Crutch, MD;  Location: [facility];  Service: Gynecology;  Laterality: N/A;   DILITATION & CURRETTAGE/HYSTROSCOPY WITH NOVASURE ABLATION N/A 12/07/2017   Procedure: DILATATION & CURETTAGE/HYSTEROSCOPY WITH ATTEMPED NOVASURE ABLATION, FAILED/ENDOMETERIAL ABLATION WITH HTA SYSTEM;  Surgeon: Corene Coy, MD;  Location: [facility]S;  Service: Gynecology;  Laterality: N/A;   ELBOW SURGERY Left    surgery for nerve pain around 2009   ENDOMETRIAL BIOPSY  07/23/2022   negative for hyperplasia or malignancy   GANGLION CYST EXCISION Right    right arm    RECTOVAGINAL FISTULA CLOSURE     in 8001,8000, 2006   ROBOTIC ASSISTED TOTAL HYSTERECTOMY Bilateral 08/12/2022   Procedure: XI ROBOTIC ASSISTED TOTAL HYSTERECTOMY WITH SALPINGECTOMY;  Surgeon: Jeralyn Crutch, MD;  Location: [facility];  Service: Gynecology;  Laterality: Bilateral;   TONSILLECTOMY     childhood   TUBAL LIGATION  2008   XI ROBOT ASSISTED RECTOPEXY N/A 08/12/2022   Procedure: XI robot repair serosal repair of rectum;  Surgeon: Sheldon Standing, MD;  Location: [facility];  Service: General;  Laterality: N/A;    Family History  Problem Relation Age of Onset   Thyroid  disease Mother    Diabetes Daughter    Diabetes Son    Breast cancer Neg Hx     Social History   Socioeconomic History   Marital status: Married    Spouse name: Not on file   Number of children: Not on file   Years of education: Not on file   Highest education level: GED or equivalent  Occupational History   Not on file  Tobacco Use   Smoking status: Every Day    Current packs/day: 0.25    Types: Cigarettes   Smokeless tobacco: Never   Tobacco comments:    Per patient, she only smokes 2 to 3 cigarettes daily as of 08/03/22.  Vaping Use   Vaping status: Never Used  Substance and Sexual Activity   Alcohol use: Not Currently   Drug use: Yes    Types: Marijuana    Comment: Occasional use only   Sexual activity: Yes    Birth control/protection: Surgical    Comment: tubal ligation  Other Topics Concern   Not on file  Social History Narrative   Not on file   Social Drivers of Health   Financial Resource Strain: Medium Risk (08/09/2023)   Overall Financial Resource Strain (CARDIA)    Difficulty of Paying Living Expenses: Somewhat hard  Food Insecurity: Food Insecurity Present (08/09/2023)   Hunger Vital Sign    Worried About Running Out of Food in the Last Year: Never true    Ran Out of Food in the Last Year: Sometimes true  Transportation Needs: No Transportation Needs (08/09/2023)   PRAPARE - Administrator, Civil Service (Medical): No    Lack of Transportation (Non-Medical): No  Physical Activity: Insufficiently Active (08/09/2023)   Exercise Vital Sign    Days of Exercise per Week: 3 days    Minutes of Exercise per Session: 30 min  Stress: No Stress Concern Present (08/09/2023)   [employer] of Occupational Health - Occupational Stress Questionnaire    Feeling of Stress : Only a little  Social Connections: Moderately Isolated (08/09/2023)   Social  Connection and Isolation Panel [NHANES]    Frequency of Communication with Friends and Family: More than three times a week    Frequency of Social Gatherings with Friends and Family: Twice a week    Attends Religious Services: Never    Database Administrator or Organizations: No    Attends Engineer, Structural: Not on file    Marital Status: Married  Catering Manager Violence: Not At Risk (05/14/2023)   Humiliation, Afraid, Rape, and Kick questionnaire    Fear of Current or Ex-Partner: No    Emotionally Abused: No    Physically Abused: No    Sexually Abused: No    Review of Systems  All other systems reviewed and are negative.       Objective    BP 124/85 (BP Location: Right Arm, Patient Position: Sitting, Cuff Size: Normal)   Pulse 90   Temp 97.7 F (36.5 C) (Oral)   Resp 16   Ht 5' 6 (1.676 m)   Wt 249 lb 3.2 oz (113 kg)   LMP 07/05/2022   SpO2 95%   BMI 40.22 kg/m   Physical Exam Vitals and nursing note reviewed.  Constitutional:      General: She is not in acute distress.    Appearance: She is obese.  Cardiovascular:     Rate and Rhythm: Normal rate and regular rhythm.  Pulmonary:     Effort: Pulmonary effort is normal.     Breath sounds: Normal breath sounds.  Abdominal:     Palpations: Abdomen is soft.     Tenderness: There is no abdominal tenderness.  Neurological:     General: No focal deficit present.     Mental Status: She is alert and oriented to person, place, and time.         Assessment & Plan:   1. Pre-op evaluation (Primary) Patient appears medically stable to undergo the stated procedure.   2. Type 2 diabetes mellitus with other specified complication, without long-term current use of insulin  (HCC) Recent A1c wnl. Patient has stopped her GLP-1 as recommended until after surgical procedure     No follow-ups on file.  Ruth [name] SQUIBB, MD

## 2023-08-13 ENCOUNTER — Ambulatory Visit (HOSPITAL_COMMUNITY)
Admission: RE | Admit: 2023-08-13 | Discharge: 2023-08-13 | Disposition: A | Discharge status: Home / Self Care | Payer: Medicaid Other | Attending: Podiatry | Admitting: Podiatry

## 2023-08-13 ENCOUNTER — Encounter (HOSPITAL_COMMUNITY): Payer: Self-pay | Admitting: Podiatry

## 2023-08-13 ENCOUNTER — Encounter (HOSPITAL_COMMUNITY)
Admission: RE | Disposition: A | Discharge status: Home / Self Care | Payer: Self-pay | Source: Home / Self Care | Attending: Podiatry

## 2023-08-13 ENCOUNTER — Ambulatory Visit (HOSPITAL_COMMUNITY): Payer: Medicaid Other

## 2023-08-13 ENCOUNTER — Ambulatory Visit (HOSPITAL_COMMUNITY): Payer: Medicaid Other | Admitting: Certified Registered"

## 2023-08-13 ENCOUNTER — Ambulatory Visit: Payer: Medicaid Other

## 2023-08-13 ENCOUNTER — Other Ambulatory Visit: Payer: Self-pay

## 2023-08-13 DIAGNOSIS — J45909 Unspecified asthma, uncomplicated: Secondary | ICD-10-CM | POA: Diagnosis not present

## 2023-08-13 DIAGNOSIS — E66813 Obesity, class 3: Secondary | ICD-10-CM | POA: Insufficient documentation

## 2023-08-13 DIAGNOSIS — E1142 Type 2 diabetes mellitus with diabetic polyneuropathy: Secondary | ICD-10-CM | POA: Insufficient documentation

## 2023-08-13 DIAGNOSIS — F17210 Nicotine dependence, cigarettes, uncomplicated: Secondary | ICD-10-CM

## 2023-08-13 DIAGNOSIS — M7662 Achilles tendinitis, left leg: Secondary | ICD-10-CM | POA: Insufficient documentation

## 2023-08-13 DIAGNOSIS — Z6841 Body Mass Index (BMI) 40.0 and over, adult: Secondary | ICD-10-CM | POA: Insufficient documentation

## 2023-08-13 DIAGNOSIS — Z01818 Encounter for other preprocedural examination: Secondary | ICD-10-CM | POA: Diagnosis not present

## 2023-08-13 DIAGNOSIS — I10 Essential (primary) hypertension: Secondary | ICD-10-CM

## 2023-08-13 DIAGNOSIS — M797 Fibromyalgia: Secondary | ICD-10-CM | POA: Insufficient documentation

## 2023-08-13 DIAGNOSIS — G4730 Sleep apnea, unspecified: Secondary | ICD-10-CM | POA: Diagnosis not present

## 2023-08-13 DIAGNOSIS — F17200 Nicotine dependence, unspecified, uncomplicated: Secondary | ICD-10-CM | POA: Diagnosis not present

## 2023-08-13 DIAGNOSIS — M7661 Achilles tendinitis, right leg: Secondary | ICD-10-CM | POA: Diagnosis not present

## 2023-08-13 HISTORY — PX: ACHILLES TENDON SURGERY: SHX542

## 2023-08-13 HISTORY — PX: OSTECTOMY: SHX6439

## 2023-08-13 LAB — GLUCOSE, CAPILLARY
Glucose-Capillary: 90 mg/dL (ref 70–99)
Glucose-Capillary: 82 mg/dL (ref 70–99)
Glucose-Capillary: 87 mg/dL (ref 70–99)

## 2023-08-13 SURGERY — OSTECTOMY
Anesthesia: Regional | Laterality: Left

## 2023-08-13 MED ORDER — ACETAMINOPHEN 500 MG PO TABS
1000.0000 mg | ORAL_TABLET | Freq: Once | ORAL | Status: AC
  Administered 2023-08-13: 1000 mg via ORAL
  Filled 2023-08-13: qty 2

## 2023-08-13 MED ORDER — SUGAMMADEX SODIUM 200 MG/2ML IV SOLN
INTRAVENOUS | Status: DC | PRN
  Administered 2023-08-13: 200 mg via INTRAVENOUS

## 2023-08-13 MED ORDER — CLINDAMYCIN HCL 300 MG PO CAPS: 300.0000 mg | ORAL_CAPSULE | Freq: Four times a day (QID) | ORAL | Status: DC

## 2023-08-13 MED ORDER — PROPOFOL 10 MG/ML IV BOLUS
INTRAVENOUS | Status: DC | PRN
  Administered 2023-08-13: 230 mg via INTRAVENOUS

## 2023-08-13 MED ORDER — CLONIDINE HCL (ANALGESIA) 100 MCG/ML EP SOLN
EPIDURAL | Status: DC | PRN
  Administered 2023-08-13: 50 ug
  Administered 2023-08-13: 100 ug

## 2023-08-13 MED ORDER — ONDANSETRON HCL 4 MG/2ML IJ SOLN
INTRAMUSCULAR | Status: AC
  Filled 2023-08-13: qty 2

## 2023-08-13 MED ORDER — CHLORHEXIDINE GLUCONATE CLOTH 2 % EX PADS
6.0000 | MEDICATED_PAD | Freq: Once | CUTANEOUS | Status: DC
Start: 2023-08-13 — End: 2023-08-13

## 2023-08-13 MED ORDER — DEXAMETHASONE SODIUM PHOSPHATE 10 MG/ML IJ SOLN
INTRAMUSCULAR | Status: AC
  Filled 2023-08-13: qty 1

## 2023-08-13 MED ORDER — PHENYLEPHRINE 80 MCG/ML (10ML) SYRINGE FOR IV PUSH (FOR BLOOD PRESSURE SUPPORT)
PREFILLED_SYRINGE | INTRAVENOUS | Status: AC
  Filled 2023-08-13: qty 10

## 2023-08-13 MED ORDER — GENTAMICIN SULFATE 40 MG/ML IJ SOLN
5.0000 mg/kg | INTRAVENOUS | Status: DC
  Filled 2023-08-13: qty 10

## 2023-08-13 MED ORDER — FENTANYL CITRATE PF 50 MCG/ML IJ SOSY
50.0000 ug | PREFILLED_SYRINGE | INTRAMUSCULAR | Status: DC
  Filled 2023-08-13: qty 2

## 2023-08-13 MED ORDER — STERILE WATER FOR IRRIGATION IR SOLN
Status: DC | PRN
  Administered 2023-08-13: 1000 mL

## 2023-08-13 MED ORDER — ROPIVACAINE HCL 5 MG/ML IJ SOLN
INTRAMUSCULAR | Status: DC | PRN
  Administered 2023-08-13: 30 mL via PERINEURAL

## 2023-08-13 MED ORDER — ORAL CARE MOUTH RINSE: 15.0000 mL | Freq: Once | OROMUCOSAL | Status: AC

## 2023-08-13 MED ORDER — CLINDAMYCIN PHOSPHATE 900 MG/50ML IV SOLN
900.0000 mg | INTRAVENOUS | Status: AC
  Administered 2023-08-13: 900 mg via INTRAVENOUS
  Filled 2023-08-13: qty 50

## 2023-08-13 MED ORDER — CLINDAMYCIN PHOSPHATE 900 MG/50ML IV SOLN: 900.0000 mg | INTRAVENOUS | Status: DC

## 2023-08-13 MED ORDER — PHENYLEPHRINE HCL (PRESSORS) 10 MG/ML IV SOLN
INTRAVENOUS | Status: DC | PRN
  Administered 2023-08-13 (×2): 80 ug via INTRAVENOUS

## 2023-08-13 MED ORDER — BUPIVACAINE HCL (PF) 0.5 % IJ SOLN
INTRAMUSCULAR | Status: DC | PRN
  Administered 2023-08-13: 20 mL

## 2023-08-13 MED ORDER — FENTANYL CITRATE PF 50 MCG/ML IJ SOSY
PREFILLED_SYRINGE | INTRAMUSCULAR | Status: AC
  Filled 2023-08-13: qty 1

## 2023-08-13 MED ORDER — GENTAMICIN SULFATE 40 MG/ML IJ SOLN
5.0000 mg/kg | INTRAVENOUS | Status: AC
  Administered 2023-08-13: 400 mg via INTRAVENOUS
  Filled 2023-08-13: qty 10

## 2023-08-13 MED ORDER — BUPIVACAINE HCL (PF) 0.5 % IJ SOLN
INTRAMUSCULAR | Status: AC
  Filled 2023-08-13: qty 30

## 2023-08-13 MED ORDER — ROCURONIUM BROMIDE 100 MG/10ML IV SOLN
INTRAVENOUS | Status: DC | PRN
  Administered 2023-08-13: 70 mg via INTRAVENOUS

## 2023-08-13 MED ORDER — CHLORHEXIDINE GLUCONATE 0.12 % MT SOLN
15.0000 mL | Freq: Once | OROMUCOSAL | Status: AC
  Administered 2023-08-13: 15 mL via OROMUCOSAL

## 2023-08-13 MED ORDER — CLINDAMYCIN HCL 300 MG PO CAPS: 300.0000 mg | ORAL_CAPSULE | Freq: Three times a day (TID) | ORAL | 0 refills | Status: DC

## 2023-08-13 MED ORDER — FENTANYL CITRATE PF 50 MCG/ML IJ SOSY
25.0000 ug | PREFILLED_SYRINGE | INTRAMUSCULAR | Status: DC | PRN
  Administered 2023-08-13 (×2): 50 ug via INTRAVENOUS

## 2023-08-13 MED ORDER — 0.9 % SODIUM CHLORIDE (POUR BTL) OPTIME
TOPICAL | Status: DC | PRN
  Administered 2023-08-13: 1000 mL

## 2023-08-13 MED ORDER — LACTATED RINGERS IV SOLN: INTRAVENOUS | Status: DC

## 2023-08-13 MED ORDER — MIDAZOLAM HCL 2 MG/2ML IJ SOLN
1.0000 mg | INTRAMUSCULAR | Status: DC
Start: 2023-08-13 — End: 2023-08-13
  Administered 2023-08-13: 2 mg via INTRAVENOUS
  Filled 2023-08-13: qty 2

## 2023-08-13 MED ORDER — FENTANYL CITRATE (PF) 100 MCG/2ML IJ SOLN
INTRAMUSCULAR | Status: AC
  Filled 2023-08-13: qty 2

## 2023-08-13 MED ORDER — DEXAMETHASONE SODIUM PHOSPHATE 10 MG/ML IJ SOLN
INTRAMUSCULAR | Status: DC | PRN
  Administered 2023-08-13: 5 mg via INTRAVENOUS

## 2023-08-13 MED ORDER — CHLORHEXIDINE GLUCONATE CLOTH 2 % EX PADS: 6.0000 | MEDICATED_PAD | Freq: Once | CUTANEOUS | Status: DC

## 2023-08-13 MED ORDER — PROPOFOL 10 MG/ML IV BOLUS
INTRAVENOUS | Status: AC
  Filled 2023-08-13: qty 20

## 2023-08-13 MED ORDER — INSULIN ASPART 100 UNIT/ML IJ SOLN: 0.0000 [IU] | INTRAMUSCULAR | Status: DC | PRN

## 2023-08-13 MED ORDER — FENTANYL CITRATE (PF) 100 MCG/2ML IJ SOLN
INTRAMUSCULAR | Status: DC | PRN
  Administered 2023-08-13: 100 ug via INTRAVENOUS

## 2023-08-13 MED ORDER — MIDAZOLAM HCL 2 MG/2ML IJ SOLN
INTRAMUSCULAR | Status: AC
  Filled 2023-08-13: qty 2

## 2023-08-13 MED ORDER — LIDOCAINE HCL (CARDIAC) PF 100 MG/5ML IV SOSY
PREFILLED_SYRINGE | INTRAVENOUS | Status: DC | PRN
Start: 2023-08-13 — End: 2023-08-13
  Administered 2023-08-13: 100 mg via INTRATRACHEAL

## 2023-08-13 MED ORDER — ASPIRIN 81 MG PO TBEC: 81.0000 mg | DELAYED_RELEASE_TABLET | Freq: Two times a day (BID) | ORAL | 0 refills | Status: AC

## 2023-08-13 MED ORDER — OXYCODONE HCL 5 MG PO TABS: 5.0000 mg | ORAL_TABLET | ORAL | 0 refills | Status: DC | PRN

## 2023-08-13 MED ORDER — ONDANSETRON HCL 4 MG/2ML IJ SOLN
INTRAMUSCULAR | Status: DC | PRN
  Administered 2023-08-13: 4 mg via INTRAVENOUS

## 2023-08-13 SURGICAL SUPPLY — 85 items
ANCH SWIVELOCK KTLS 4.75 (Anchor) IMPLANT
ANCHOR SYS DX KL 4.75 (Anchor) IMPLANT
BAG COUNTER SPONGE SURGICOUNT (BAG) IMPLANT
BAG ZIPLOCK 12X15 (MISCELLANEOUS) ×1 IMPLANT
BANDAGE ESMARK 6X9 LF (GAUZE/BANDAGES/DRESSINGS) ×1 IMPLANT
BLADE AVERAGE 25X9 (BLADE) IMPLANT
BLADE HEX COATED 2.75 (ELECTRODE) ×1 IMPLANT
BLADE MICRO SAGITTAL (BLADE) ×1 IMPLANT
BLADE SAW SGTL 14.8X61X.97 HD (BLADE) ×1 IMPLANT
BLADE SURG 15 STRL LF DISP TIS (BLADE) ×3 IMPLANT
BLADE SURG SZ10 CARB STEEL (BLADE) ×1 IMPLANT
BNDG COHESIVE 6X5 TAN ST LF (GAUZE/BANDAGES/DRESSINGS) IMPLANT
BNDG ELASTIC 3INX 5YD STR LF (GAUZE/BANDAGES/DRESSINGS) ×1 IMPLANT
BNDG ELASTIC 4INX 5YD STR LF (GAUZE/BANDAGES/DRESSINGS) IMPLANT
BNDG ELASTIC 6INX 5YD STR LF (GAUZE/BANDAGES/DRESSINGS) ×1 IMPLANT
BNDG ESMARK 6X9 LF (GAUZE/BANDAGES/DRESSINGS)
BNDG GAUZE DERMACEA FLUFF 4 (GAUZE/BANDAGES/DRESSINGS) ×1 IMPLANT
CHLORAPREP W/TINT 26 (MISCELLANEOUS) ×1 IMPLANT
COVER BACK TABLE 60X90IN (DRAPES) ×1 IMPLANT
COVER MAYO STAND STRL (DRAPES) IMPLANT
COVER SURGICAL LIGHT HANDLE (MISCELLANEOUS) ×1 IMPLANT
CUFF TRNQT CYL 24X4X16.5-23 (TOURNIQUET CUFF) IMPLANT
CUFF TRNQT CYL 34X4.125X (TOURNIQUET CUFF) IMPLANT
DRAPE C-ARM 42X120 X-RAY (DRAPES) IMPLANT
DRAPE C-ARMOR (DRAPES) ×1 IMPLANT
DRAPE EXTREMITY T 121X128X90 (DISPOSABLE) ×1 IMPLANT
DRAPE IMP U-DRAPE 54X76 (DRAPES) ×1 IMPLANT
DRAPE U-SHAPE 47X51 STRL (DRAPES) ×1 IMPLANT
DRSG ADAPTIC 3X8 NADH LF (GAUZE/BANDAGES/DRESSINGS) IMPLANT
DRSG EMULSION OIL 3X3 NADH (GAUZE/BANDAGES/DRESSINGS) IMPLANT
DRSG TEGADERM 4X4.75 (GAUZE/BANDAGES/DRESSINGS) IMPLANT
DURAPREP 26ML APPLICATOR (WOUND CARE) ×1 IMPLANT
ELECT REM PT RETURN 15FT ADLT (MISCELLANEOUS) ×1 IMPLANT
ELECT REM PT RETURN 9FT ADLT (ELECTROSURGICAL) ×1
ELECTRODE REM PT RTRN 9FT ADLT (ELECTROSURGICAL) ×1 IMPLANT
GAUZE 4X4 16PLY ~~LOC~~+RFID DBL (SPONGE) IMPLANT
GAUZE PAD ABD 8X10 STRL (GAUZE/BANDAGES/DRESSINGS) ×1 IMPLANT
GAUZE SPONGE 4X4 12PLY STRL (GAUZE/BANDAGES/DRESSINGS) ×1 IMPLANT
GAUZE XEROFORM 1X8 LF (GAUZE/BANDAGES/DRESSINGS) ×1 IMPLANT
GLOVE BIO SURGEON STRL SZ7 (GLOVE) ×1 IMPLANT
GLOVE BIO SURGEON STRL SZ7.5 (GLOVE) ×1 IMPLANT
GLOVE BIOGEL PI IND STRL 7.5 (GLOVE) ×1 IMPLANT
GOWN STRL REUS W/ TWL LRG LVL3 (GOWN DISPOSABLE) ×1 IMPLANT
KIT BASIN OR (CUSTOM PROCEDURE TRAY) ×1 IMPLANT
KIT TURNOVER KIT A (KITS) IMPLANT
NDL HYPO 25X1 1.5 SAFETY (NEEDLE) ×1 IMPLANT
NEEDLE HYPO 25X1 1.5 SAFETY (NEEDLE)
NS IRRIG 1000ML POUR BTL (IV SOLUTION) ×1 IMPLANT
PACK ORTHO EXTREMITY (CUSTOM PROCEDURE TRAY) IMPLANT
PAD CAST 4YDX4 CTTN HI CHSV (CAST SUPPLIES) ×1 IMPLANT
PADDING CAST ABS COTTON 4X4 ST (CAST SUPPLIES) ×1 IMPLANT
PADDING CAST COTTON 6X4 STRL (CAST SUPPLIES) ×1 IMPLANT
PASSER SUT SWANSON 36MM LOOP (INSTRUMENTS) ×1 IMPLANT
PENCIL SMOKE EVACUATOR (MISCELLANEOUS) ×1 IMPLANT
PROTECTOR NERVE ULNAR (MISCELLANEOUS) ×1 IMPLANT
SHEET MEDIUM DRAPE 40X70 STRL (DRAPES) ×1 IMPLANT
SLEEVE SCD COMPRESS KNEE MED (STOCKING) ×1 IMPLANT
SPLINT FIBERGLASS 4X30 (CAST SUPPLIES) ×1 IMPLANT
SPONGE T-LAP 4X18 ~~LOC~~+RFID (SPONGE) ×1 IMPLANT
STAPLER SKIN PROX WIDE 3.9 (STAPLE) IMPLANT
STAPLER VISISTAT (STAPLE) ×1 IMPLANT
STOCKINETTE 4X48 STRL (DRAPES) ×1 IMPLANT
STOCKINETTE 6 STRL (DRAPES) ×1 IMPLANT
SUCTION TUBE FRAZIER 10FR DISP (SUCTIONS) ×1 IMPLANT
SUT ETHILON 3 0 PS 1 (SUTURE) IMPLANT
SUT ETHILON 4 0 PS 2 18 (SUTURE) IMPLANT
SUT FIBERWIRE #2 38 T-5 BLUE (SUTURE)
SUT MERSILENE 4 0 P 3 (SUTURE) IMPLANT
SUT MNCRL AB 3-0 PS2 18 (SUTURE) ×1 IMPLANT
SUT MNCRL AB 4-0 PS2 18 (SUTURE) ×1 IMPLANT
SUT PROLENE 4 0 PS 2 18 (SUTURE) ×1 IMPLANT
SUT VIC AB 0 CT2 27 (SUTURE) ×1 IMPLANT
SUT VIC AB 2-0 CT1 TAPERPNT 27 (SUTURE) ×2 IMPLANT
SUT VIC AB 2-0 SH 27XBRD (SUTURE) ×1 IMPLANT
SUT VIC AB 4-0 PS2 18 (SUTURE) ×1 IMPLANT
SUTURE FIBERWR #2 38 T-5 BLUE (SUTURE) ×2 IMPLANT
SYR BULB EAR ULCER 3OZ GRN STR (SYRINGE) ×1 IMPLANT
SYR BULB IRRIG 60ML STRL (SYRINGE) ×1 IMPLANT
SYR CONTROL 10ML LL (SYRINGE) ×1 IMPLANT
TAPE STRIPS DRAPE STRL (GAUZE/BANDAGES/DRESSINGS) ×1 IMPLANT
TOWEL GREEN STERILE FF (TOWEL DISPOSABLE) ×1 IMPLANT
TOWEL OR 17X26 10 PK STRL BLUE (TOWEL DISPOSABLE) ×2 IMPLANT
UNDERPAD 30X36 HEAVY ABSORB (UNDERPADS AND DIAPERS) ×1 IMPLANT
WATER STERILE IRR 1000ML POUR (IV SOLUTION) ×1 IMPLANT
YANKAUER SUCT BULB TIP NO VENT (SUCTIONS) ×1 IMPLANT

## 2023-08-13 NOTE — H&P (Signed)
 Subjective:  Patient ID: Ruth [name], female    DOB: 06-14-1976,   MRN: [id number]  No chief complaint on file.   48 y.o. female presents for follow-up of bilateral achilles tendonitis. Relates no improvement in pain and no improvement whit PT. Relates left side has been more painful. Relates she has been stretching and not finding any shoes comfortable. It has been difficult to work. Medications did not help. She is ready to review MRI and potentially discuss surgery. She does smok and history of Type 2 diabetes. Relates last A1c was 6.2   PCP:  Tanda Bleacher, MD     Denies any other pedal complaints. Denies n/v/f/c.   Past Medical History:  Diagnosis Date   Abnormal uterine bleeding 2023   Anemia 2023   Anxiety    Arthritis    chronic back pain    Asthma    rarely uses inhaler   Bipolar 1 disorder (HCC)    Follows w/ PCP Dr. Bleacher Tanda.   Fibromyalgia    GERD (gastroesophageal reflux disease)    Headache    Hidradenitis    multiple boils, axilla and groin (As of 08/03/22, patient has a boil in her left axilla.)   HLD (hyperlipidemia)    diet controlled   Hx of migraines    occ   Hypertension    no meds   Hypothyroidism    Intentional drug overdose (HCC) 03/28/2021   Obesity    Sleep apnea    Patient states that she had a sleep study in the past and was told she did not need a CPAP.   Thrombocytosis 01/26/2020   Type 2 diabetes mellitus (HCC)    Type 2, Follows w/ PCP Dr. Bleacher Tanda.   Wears glasses     Objective:  Physical Exam: Vascular: DP/PT pulses 2/4 bilateral. CFT <3 seconds. Normal hair growth on digits. No edema.  Skin. No lacerations or abrasions bilateral feet.  Musculoskeletal: MMT 5/5 bilateral lower extremities in DF, PF, Inversion and Eversion. Deceased ROM in DF of ankle joint. Most tender to proximal achilles tendon bilateral in watershed area. Some pain to insertion site bilateral. Some pain along PT tendon and medial calcaneal  tubercle bilateral  Neurological: Sensation intact to light touch.   MRI left  IMPRESSION: 1. High-grade marrow edema throughout a moderate chronic ossified enthesopathic spur posterior to the distal Achilles tendon insertion. This likely represents acute stress related change within the enthesopathic spur that likely formed as a result of chronic distal Achilles traction stress changes/tendinosis. Mild distal Achilles tendinosis. No tendon retraction. 2. Mild posterior tibial and flexor hallucis longus tenosynovitis. 3. Large plantar calcaneal heel spur. The plantar fascia origin is intact.  MRI right  IMPRESSION: 1. Moderate chronic ossified enthesopathic spur posterior distal to the Achilles tendon insertion with near fluid bright marrow edema within this enthesophyte. There is mild distal Achilles insertional tendinosis. Punctate 1 mm possible interstitial tear at the far distal medial Achilles tendon insertion. No tendon retraction. These findings likely reflect acute on chronic Achilles tendinosis. 2. Moderate-to-large plantar calcaneal heel spur. The plantar fascia origin is intact. 3. Mild cartilage thinning and subchondral marrow edema within the anterior aspect of the middle subtalar joint.   Assessment:   1. Type 2 diabetes mellitus with peripheral neuropathy (HCC)   2. Pre-op testing       Plan:  Patient was evaluated and treated and all questions answered. -Xrays reviewed. Spurring noted to inferior and posterior calcaneus bilateral.  -Discussed  Achilles insertional tendonitis and treatment options with patient.  -Continue stretching  -Continue PT -Celebrex  to aid with pain -MRI reviewed in detail.  -Discussed options from now including surgical intervention vs PRP injections. Patient relates she is ready to discuss surgery.  -Discussed in detail perioperative course and non weightbearing period.  -Informed surgical risk consent was reviewed and read aloud  to the patient.  I reviewed the films.  I have discussed my findings with the patient in great detail.  I have discussed all risks including but not limited to infection, stiffness, scarring, limp, disability, deformity, damage to blood vessels and nerves, numbness, poor healing, need for braces, arthritis, chronic pain, amputation, death.  All benefits and realistic expectations discussed in great detail.  I have made no promises as to the outcome.  I have provided realistic expectations.  I have offered the patient a 2nd opinion, which they have declined and assured me they preferred to proceed despite the risks. -Discussed smoking cessation and needing to quit 4 weeks before hand and 4 weeks after. Discussed serious consequences of wound healing if continues to smoke. Also discussed risks with Type 2 diabetes and last A1c was well controlled at 6.2  -Will plan for left achilles tendon secondary repair with haglunds resection.  -Post-op: oxycodone , keflex , ASA bid, and zofran   Plan for surgery in beginning of year pending smoking cessation. .    Granvel Proudfoot, DPM

## 2023-08-13 NOTE — Transfer of Care (Signed)
 Immediate Anesthesia Transfer of Care Note  Patient: [name]  Procedure(s) Performed: CALCANEAL OSTECTOMY (Left) ACHILLES TENDON REPAIR (Left)  Patient Location: PACU  Anesthesia Type:General  Level of Consciousness: awake and alert   Airway & Oxygen Therapy: Patient Spontanous Breathing  Post-op Assessment: Report given to RN and Post -op Vital signs reviewed and stable  Post vital signs: Reviewed and stable  Last Vitals:  Vitals Value Taken Time  BP 130/82 08/13/23 1648  Temp    Pulse 71 08/13/23 1652  Resp 17 08/13/23 1652  SpO2 97 % 08/13/23 1652  Vitals shown include unfiled device data.  Last Pain:  Vitals:   08/13/23 1330  TempSrc:   PainSc: 9          Complications: No notable events documented.

## 2023-08-13 NOTE — Anesthesia Procedure Notes (Signed)
 Anesthesia Regional Block: Adductor canal block   Pre-Anesthetic Checklist: , timeout performed,  Correct Patient, Correct Site, Correct Laterality,  Correct Procedure, Correct Position, site marked,  Risks and benefits discussed,  Surgical consent,  Pre-op evaluation,  At surgeon's request and post-op pain management  Laterality: Lower and Left  Prep: chloraprep       Needles:  Injection technique: Single-shot  Needle Type: Echogenic Needle     Needle Length: 9cm  Needle Gauge: 22     Additional Needles:   Procedures:,,,, ultrasound used (permanent image in chart),,    Narrative:  Start time: 08/13/2023 3:26 PM End time: 08/13/2023 3:29 PM Injection made incrementally with aspirations every 5 mL.  Performed by: Personally  Anesthesiologist: Jefm Garnette LABOR, MD  Additional Notes: Block assessed prior to surgery. Pt tolerated procedure well.

## 2023-08-13 NOTE — Anesthesia Preprocedure Evaluation (Addendum)
 Anesthesia Evaluation  Patient identified by MRN, date of birth, ID band Patient awake    Reviewed: Allergy & Precautions, NPO status , Patient's Chart, lab work & pertinent test results  Airway Mallampati: III  TM Distance: >3 FB Neck ROM: Full  Mouth opening: Limited Mouth Opening  Dental no notable dental hx. (+) Teeth Intact, Dental Advisory Given   Pulmonary asthma , sleep apnea , Current Smoker and Patient abstained from smoking.   Pulmonary exam normal breath sounds clear to auscultation       Cardiovascular hypertension, Normal cardiovascular exam Rhythm:Regular Rate:Normal     Neuro/Psych  Headaches PSYCHIATRIC DISORDERS Anxiety  Bipolar Disorder      GI/Hepatic Neg liver ROS,GERD  ,,  Endo/Other  diabetes, Type 2, Oral Hypoglycemic AgentsHypothyroidism  Class 3 obesity (BMI 40)  Renal/GU negative Renal ROS  negative genitourinary   Musculoskeletal  (+) Arthritis ,  Fibromyalgia -  Abdominal   Peds  Hematology negative hematology ROS (+)   Anesthesia Other Findings   Reproductive/Obstetrics                             Anesthesia Physical Anesthesia Plan  ASA: 3  Anesthesia Plan: General and Regional   Post-op Pain Management: Tylenol  PO (pre-op)* and Regional block*   Induction: Intravenous  PONV Risk Score and Plan: 2 and Midazolam , Dexamethasone  and Ondansetron   Airway Management Planned: Oral ETT  Additional Equipment:   Intra-op Plan:   Post-operative Plan: Extubation in OR  Informed Consent: I have reviewed the patients History and Physical, chart, labs and discussed the procedure including the risks, benefits and alternatives for the proposed anesthesia with the patient or authorized representative who has indicated his/her understanding and acceptance.     Dental advisory given  Plan Discussed with: CRNA  Anesthesia Plan Comments: (L pop + L adductor block  +GA)       Anesthesia Quick Evaluation

## 2023-08-13 NOTE — Interval H&P Note (Signed)
 History and Physical Interval Note:  08/13/2023 3:11 PM  Ruth [name]  has presented today for surgery, with the diagnosis of Achilles tendonitis.  The various methods of treatment have been discussed with the patient and family. After consideration of risks, benefits and other options for treatment, the patient has consented to  Procedure(s) with comments: CALCANEAL OSTECTOMY (Left) - POPLITEAL/SAPHENOUS BLOCK ACHILLES TENDON REPAIR (Left) as a surgical intervention.  The patient's history has been reviewed, patient examined, no change in status, stable for surgery.  I have reviewed the patient's chart and labs.  Questions were answered to the patient's satisfaction.     [name]

## 2023-08-13 NOTE — Op Note (Signed)
 OPERATIVE REPORT Patient name: Ruth [name] MRN: [id number] DOB: May 19, 1976  DOS:  08/13/23  Preop Dx: Type 2 diabetes mellitus with peripheral neuropathy (HCC) - Plan: CBG per Guidelines for Diabetes Management for Patients Undergoing Surgery ([facility], [facility], and [facility] only), CBG per Guidelines for Diabetes Management for Patients Undergoing Surgery ([facility], [facility], and [facility] only)  Pre-op testing - Plan: CBG per Guidelines for Diabetes Management for Patients Undergoing Surgery ([facility], [facility], and [facility] only), CBG per Guidelines for Diabetes Management for Patients Undergoing Surgery ([facility], [facility], and [facility] only) Postop Dx: same  Procedure:  1. Left calcaneal haglunds ostectomy with secondary achilles tendon repair   Surgeon: [name], DPM  Anesthesia: General and regional pop-saph block   Hemostasis: Thigh TQ at .   EBL: minimal Materials: Arthrex Achilles speed bridge with four absorbale Peek screws with Jumpstart  Injectables: n/a Pathology: n/a  Condition: The patient tolerated the procedure and anesthesia well. No complications noted or reported   Justification for procedure: The patient is a 48 y.o. female who presents today for surgical correction of achilles haglund deformity and achilles tendontisi. All conservative modalities of been unsuccessful in providing any sort of satisfactory alleviation of symptoms with the patient. The patient was told benefits as well as possible side effects of the surgery. The patient consented for surgical correction. The patient consent form was reviewed. All patient questions were answered. No guarantees were expressed or implied. The patient and the surgeon both signed the patient consent form with the witness present and placed in the patient's chart.   Procedure in Detail: The patient was brought to the operating room, placed in the operating table in the supine position at which time an aseptic scrub and drape were performed about the patient's  respective lower extremity after anesthesia was induced as described above. Attention was then directed to the surgical area where procedure number one commenced.  Procedure #1:   The left lower extremity was elevated, examined and the thigh tourniquet was inflated to 350  mmHg.  I then directed attention to the distal Achilles tendon and the posterior calcaneus where a linear incision was made extending from the glabrous junction of the heel to the midsubstance of the Achilles tendon. This was carried through subcutaneous tissue, cauterizing bleeding vessels as necessary. The paratenon was identified and incised and reflected, and the distal Achilles tendon was dissected medially and laterally. It attached to a large spur distally and this was resected using an osteotome and sagittal saw and smoothed with a power rasp. The Achilles tendon was reflected from the posterior calcaneus superiorly as well and the Haglund's deformity was identified. This was then resected using an osteotome and sagittal saw and smoothed with a power rasp. The Achilles tendon exhibited significant degenerative changes with fatty infiltration and intratendinous calcifications. These were debrided from the healthy tendon and it was reduced in bulk and thickness with a scalpel.  The wound was thoroughly irrigated with saline. Arthrex achilles speed bridge was then incorporated in the repair site. Repair was tested and felt to be strong  The wound was then closed in layers using 3-0 monocryl, and 3-0 nylon .   Dry sterile compressive dressings were then applied to all previously mentioned incision sites about the patient's lower extremity. The patient was then transferred from the operating room to the recovery room having tolerated the procedure and anesthesia well. All vital signs are stable. After a brief stay in the recovery room the patient was discharged  Disposition:  Patient will be non weightbearing to the left lower  extremity. Will keep dressing clean dry and intact until follow-up with me. Post-op pain and nausea medication provided. Will take keflex  and ASA as prescribed. Follow-up with me in one week for post-op check    [name], DPM [facility]  Dr. [name], DPM    [address] [place], [place] [address]                Office [phone]  Fax [phone]

## 2023-08-13 NOTE — Anesthesia Procedure Notes (Signed)
 Anesthesia Regional Block: Popliteal block   Pre-Anesthetic Checklist: , timeout performed,  Correct Patient, Correct Site, Correct Laterality,  Correct Procedure, Correct Position, site marked,  Risks and benefits discussed,  Pre-op evaluation,  At surgeon's request and post-op pain management  Laterality: Lower and Left  Prep: Maximum Sterile Barrier Precautions used, chloraprep       Needles:  Injection technique: Single-shot  Needle Type: Echogenic Needle     Needle Length: 9cm  Needle Gauge: 21     Additional Needles:   Procedures:,,,, ultrasound used (permanent image in chart),,    Narrative:  Start time: 08/13/2023 3:20 PM End time: 08/13/2023 3:25 PM Injection made incrementally with aspirations every 5 mL.  Performed by: Personally  Anesthesiologist: Jefm Garnette LABOR, MD  Additional Notes: Block assessed. Patient tolerated procedure well.

## 2023-08-13 NOTE — Anesthesia Procedure Notes (Signed)
 Procedure Name: Intubation Date/Time: 08/13/2023 3:45 PM  Performed by: Vaughn Zebedee HERO, CRNAPre-anesthesia Checklist: Patient identified, Emergency Drugs available, Suction available and Patient being monitored Patient Re-evaluated:Patient Re-evaluated prior to induction Oxygen Delivery Method: Circle system utilized Preoxygenation: Pre-oxygenation with 100% oxygen Induction Type: IV induction Ventilation: Mask ventilation without difficulty Laryngoscope Size: Miller and 2 Grade View: Grade II Tube size: 7.5 mm Number of attempts: 1 Airway Equipment and Method: Stylet Placement Confirmation: ETT inserted through vocal cords under direct vision, positive ETCO2 and breath sounds checked- equal and bilateral Secured at: 22 cm Tube secured with: Tape Dental Injury: Teeth and Oropharynx as per pre-operative assessment

## 2023-08-13 NOTE — Progress Notes (Signed)
 Orthopedic Tech Progress Note Patient Details:  Luca Burston 01-06-1976 [id number]  Ortho Devices Type of Ortho Device: CAM walker, Crutches Ortho Device/Splint Location: LLE Ortho Device/Splint Interventions: Application   Post Interventions Patient Tolerated: Well Instructions Provided: Poper ambulation with device, Adjustment of device  Cuong Moorman E Wacey Zieger 08/13/2023, 6:28 PM

## 2023-08-13 NOTE — Brief Op Note (Signed)
 08/13/2023  3:13 PM  PATIENT:  Ruth [name]  48 y.o. female  PRE-OPERATIVE DIAGNOSIS:  Achilles tendonitis  POST-OPERATIVE DIAGNOSIS:  Achilles tendonitis  PROCEDURE:  Procedure(s) with comments: CALCANEAL OSTECTOMY (Left) - POPLITEAL/SAPHENOUS BLOCK ACHILLES TENDON REPAIR (Left)  SURGEON:  Surgeons and Role:    * Joya Stabs, DPM - Primary  PHYSICIAN ASSISTANT:   ASSISTANTS: none   ANESTHESIA:   regional and general  EBL:  Minimal   BLOOD ADMINISTERED:none  DRAINS: none   LOCAL MEDICATIONS USED:  NONE  SPECIMEN:  No Specimen  DISPOSITION OF SPECIMEN:  N/A  COUNTS:  YES  TOURNIQUET:  * Missing tourniquet times found for documented tourniquets in log: [id number] *  DICTATION: .Note written in EPIC  PLAN OF CARE: Discharge to home after PACU  PATIENT DISPOSITION:  PACU - hemodynamically stable.   Delay start of Pharmacological VTE agent (>24hrs) due to surgical blood loss or risk of bleeding: no

## 2023-08-14 ENCOUNTER — Other Ambulatory Visit: Payer: Self-pay | Admitting: Podiatry

## 2023-08-14 MED ORDER — CLINDAMYCIN HCL 300 MG PO CAPS: 300.0000 mg | ORAL_CAPSULE | Freq: Three times a day (TID) | ORAL | 0 refills | Status: AC

## 2023-08-14 MED ORDER — ONDANSETRON HCL 4 MG PO TABS: 4.0000 mg | ORAL_TABLET | Freq: Three times a day (TID) | ORAL | 0 refills | Status: DC | PRN

## 2023-08-15 NOTE — Anesthesia Postprocedure Evaluation (Addendum)
 Anesthesia Post Note  Patient: Tywana Robotham  Procedure(s) Performed: CALCANEAL OSTECTOMY (Left) ACHILLES TENDON REPAIR (Left)     Patient location during evaluation: PACU Anesthesia Type: Regional Level of consciousness: awake and alert Pain management: pain level controlled Vital Signs Assessment: post-procedure vital signs reviewed and stable Respiratory status: spontaneous breathing, nonlabored ventilation and respiratory function stable Cardiovascular status: blood pressure returned to baseline Postop Assessment: no apparent nausea or vomiting Anesthetic complications: no   No notable events documented.  Last Vitals:  Vitals:   08/13/23 1800 08/13/23 1816  BP: 120/74 131/87  Pulse: 69 64  Resp: 15 20  Temp: (!) 36.4 C (!) 36.4 C  SpO2: 98% 100%    Last Pain:  Vitals:   08/13/23 1816  TempSrc: Oral  PainSc: 3                  Vertell Row

## 2023-08-15 NOTE — Addendum Note (Signed)
 Addendum  created 08/15/23 0904 by Vernadine Golas, MD   Clinical Note Signed

## 2023-08-17 ENCOUNTER — Ambulatory Visit (INDEPENDENT_AMBULATORY_CARE_PROVIDER_SITE_OTHER): Payer: Medicaid Other | Admitting: Podiatry

## 2023-08-17 ENCOUNTER — Telehealth: Payer: Self-pay | Admitting: Podiatry

## 2023-08-17 ENCOUNTER — Encounter: Payer: Self-pay | Admitting: Podiatry

## 2023-08-17 ENCOUNTER — Encounter: Payer: Medicaid Other | Admitting: Podiatry

## 2023-08-17 DIAGNOSIS — M7661 Achilles tendinitis, right leg: Secondary | ICD-10-CM

## 2023-08-17 DIAGNOSIS — M7662 Achilles tendinitis, left leg: Secondary | ICD-10-CM

## 2023-08-17 NOTE — Telephone Encounter (Signed)
Patient called and would like her bandage to be changed today if possible. She has a post op visit next week, however she would like to come in today, she says that she is in a lot of pain. Thank you.

## 2023-08-17 NOTE — Progress Notes (Signed)
  Subjective:  Patient ID: Ruth [name], female    DOB: Jan 17, 1976,  MRN: [id number]  Chief Complaint  Patient presents with   Routine Post Op    Patient states that something felt like it was pulling and that she just wanted to make sure everything was ok and to get it looked at     DOS: 08/13/2023 Procedure: Haglund resection secondary Achilles repair  48 y.o. female returns for post-op check.  Pain was significant when the block wore off but is improving she felt like the incision was tight and rubbing  Review of Systems: Negative except as noted in the HPI. Denies N/V/F/Ch.   Objective:  There were no vitals filed for this visit. There is no height or weight on file to calculate BMI. Constitutional Well developed. Well nourished.  Vascular Foot warm and well perfused. Capillary refill normal to all digits.  Calf is soft and supple, no posterior calf or knee pain, negative Homans' sign  Neurologic Normal speech. Oriented to person, place, and time. Epicritic sensation to light touch grossly present bilaterally.  Dermatologic Skin healing well without signs of infection. Skin edges well coapted without signs of infection.  Slight periwound maceration  Orthopedic: Tenderness to palpation noted about the surgical site.   Assessment:   1. Achilles tendonitis, bilateral    Plan:  Patient was evaluated and treated and all questions answered.  S/p foot surgery left -Progressing as expected post-operatively.  Slight maceration but no dehiscence.  Betadine applied to periwound and sterile dry dressing was applied.  Ace wrap applied.  Continue boot immobilization.  Return as scheduled in 1 week to see Dr. Ralene Cork.  Continue nonweightbearing with knee scooter    No follow-ups on file.

## 2023-08-23 ENCOUNTER — Ambulatory Visit (INDEPENDENT_AMBULATORY_CARE_PROVIDER_SITE_OTHER): Payer: Medicaid Other

## 2023-08-23 ENCOUNTER — Ambulatory Visit (INDEPENDENT_AMBULATORY_CARE_PROVIDER_SITE_OTHER): Payer: Medicaid Other | Admitting: Podiatry

## 2023-08-23 DIAGNOSIS — Z9889 Other specified postprocedural states: Secondary | ICD-10-CM

## 2023-08-23 DIAGNOSIS — M7662 Achilles tendinitis, left leg: Secondary | ICD-10-CM | POA: Diagnosis not present

## 2023-08-23 NOTE — Progress Notes (Signed)
Subjective:  Patient ID: Ruth [name], female    DOB: [birth year],  MRN: [id number]  No chief complaint on file.   DOS: 08/13/23   Procedure: Left haglunds resection with secondary achilles tendon repair    48 y.o. female returns for POV#2. Relates doing well and managing with pain. Has seen Dr. Lilian Kapur last week for concern of drainage through dressing and pain   Review of Systems: Negative except as noted in the HPI. Denies N/V/F/Ch.  Past Medical History:  Diagnosis Date   Abnormal uterine bleeding 2023   Anemia 2023   Anxiety    Arthritis    chronic back pain    Asthma    rarely uses inhaler   Bipolar 1 disorder (HCC)    Follows w/ PCP Dr. Georganna Skeans.   Fibromyalgia    GERD (gastroesophageal reflux disease)    Headache    Hidradenitis    multiple boils, axilla and groin (As of 08/03/22, patient has a boil in her left axilla.)   HLD (hyperlipidemia)    diet controlled   Hx of migraines    occ   Hypertension    no meds   Hypothyroidism    Intentional drug overdose (HCC) 03/28/2021   Obesity    Sleep apnea    Patient states that she had a sleep study in the past and was told she did not need a CPAP.   Thrombocytosis 01/26/2020   Type 2 diabetes mellitus (HCC)    Type 2, Follows w/ PCP Dr. Georganna Skeans.   Wears glasses     Current Outpatient Medications:    albuterol (PROVENTIL) (2.5 MG/3ML) 0.083% nebulizer solution, Take 3 mLs (2.5 mg total) by nebulization every 6 (six) hours as needed for wheezing or shortness of breath., Disp: 75 mL, Rfl: 12   albuterol (VENTOLIN HFA) 108 (90 Base) MCG/ACT inhaler, Inhale 2 puffs into the lungs every 4 (four) hours as needed for wheezing or shortness of breath., Disp: 18 g, Rfl: 5   aspirin EC 81 MG tablet, Take 1 tablet (81 mg total) by mouth in the morning and at bedtime. Swallow whole., Disp: 60 tablet, Rfl: 0   atorvastatin (LIPITOR) 20 MG tablet, Take 1 tablet (20 mg total) by mouth daily., Disp: 90 tablet,  Rfl: 0   celecoxib (CELEBREX) 100 MG capsule, TAKE 1 CAPSULE BY MOUTH TWICE A DAY, Disp: 60 capsule, Rfl: 0   cetirizine (ZYRTEC ALLERGY) 10 MG tablet, Take 1 tablet (10 mg total) by mouth daily. (Patient taking differently: Take 10 mg by mouth daily as needed for allergies.), Disp: 30 tablet, Rfl: 2   Continuous Glucose Receiver (DEXCOM G7 RECEIVER) DEVI, Use to check blood sugar three times a day ., Disp: 1 each, Rfl: 0   fluticasone (FLONASE) 50 MCG/ACT nasal spray, Place 1 spray into both nostrils daily. (Patient taking differently: Place 1 spray into both nostrils daily as needed for allergies or rhinitis.), Disp: 9.9 mL, Rfl: 2   fluticasone (FLOVENT HFA) 110 MCG/ACT inhaler, Inhale 2 puffs into the lungs 2 (two) times daily., Disp: 1 each, Rfl: 5   hydrOXYzine (ATARAX) 25 MG tablet, Take 1 tablet (25 mg total) by mouth 3 (three) times daily as needed., Disp: 30 tablet, Rfl: 0   ibuprofen (ADVIL) 800 MG tablet, Take 1 tablet (800 mg total) by mouth 3 (three) times daily., Disp: 21 tablet, Rfl: 0   Iron, Ferrous Sulfate, 325 (65 Fe) MG TABS, Take 325 mg by mouth daily., Disp: 90 tablet, Rfl:  1   meloxicam (MOBIC) 15 MG tablet, Take 1 tablet (15 mg total) by mouth daily., Disp: 30 tablet, Rfl: 0   miconazole (MICOTIN) 2 % cream, Apply 1 Application topically 2 (two) times daily., Disp: 60 g, Rfl: 0   montelukast (SINGULAIR) 10 MG tablet, Take 1 tablet (10 mg total) by mouth at bedtime., Disp: 90 tablet, Rfl: 1   Multiple Vitamin (MULTIVITAMIN) capsule, Take 2 capsules by mouth daily., Disp: , Rfl:    mupirocin ointment (BACTROBAN) 2 %, Apply 1 Application topically 2 (two) times daily., Disp: 22 g, Rfl: 0   nystatin cream (MYCOSTATIN), Apply 1 Application topically 2 (two) times daily., Disp: 60 g, Rfl: 0   OLANZapine (ZYPREXA) 5 MG tablet, Take 1 tablet (5 mg total) by mouth at bedtime., Disp: 90 tablet, Rfl: 0   ondansetron (ZOFRAN) 4 MG tablet, Take 1 tablet (4 mg total) by mouth every 6 (six)  hours., Disp: 12 tablet, Rfl: 0   ondansetron (ZOFRAN) 4 MG tablet, Take 1 tablet (4 mg total) by mouth every 8 (eight) hours as needed for nausea or vomiting., Disp: 20 tablet, Rfl: 0   oxyCODONE (ROXICODONE) 5 MG immediate release tablet, Take 1 tablet (5 mg total) by mouth every 4 (four) hours as needed for severe pain (pain score 7-10)., Disp: 30 tablet, Rfl: 0   silver sulfADIAZINE (SILVADENE) 1 % cream, Apply 1 Application topically daily. Apply 2-3 times daily to the left hand, cover when at work otherwise may leave open to air., Disp: 50 g, Rfl: 0   tirzepatide (MOUNJARO) 10 MG/0.5ML Pen, Inject 10 mg into the skin once a week., Disp: 2 mL, Rfl: 0   tirzepatide (MOUNJARO) 12.5 MG/0.5ML Pen, Inject 12.5 mg into the skin once a week., Disp: 2 mL, Rfl: 0   tirzepatide (MOUNJARO) 15 MG/0.5ML Pen, Inject 15 mg into the skin once a week., Disp: 6 mL, Rfl: 1  Social History   Tobacco Use  Smoking Status Every Day   Current packs/day: 0.25   Types: Cigarettes  Smokeless Tobacco Never  Tobacco Comments   Per patient, she only smokes 2 to 3 cigarettes daily as of 08/03/22.    Allergies  Allergen Reactions   Metformin And Related Diarrhea   Penicillins Other (See Comments)    Unknown; from childhood Has patient had a PCN reaction causing immediate rash, facial/tongue/throat swelling, SOB or lightheadedness with hypotension: Unknow Has patient had a PCN reaction causing severe rash involving mucus membranes or skin necrosis: Unknown Has patient had a PCN reaction that required hospitalization: Unknown Has patient had a PCN reaction occurring within the last 10 years: Unknown If all of the above answers are "NO", then may proceed with Cephalosporin use.    Victoza [Liraglutide] Itching   Objective:  There were no vitals filed for this visit. There is no height or weight on file to calculate BMI. Constitutional Well developed. Well nourished.  Vascular Foot warm and well  perfused. Capillary refill normal to all digits.   Neurologic Normal speech. Oriented to person, place, and time. Epicritic sensation to light touch grossly present bilaterally.  Dermatologic Skin healing well without signs of infection. Skin edges well coapted without signs of infection.  Orthopedic: Tenderness to palpation noted about the surgical site.   Radiographs: Interval resection of spurring and haglunds deformity posterior calcaneus  Assessment:   1. Status post surgery    Plan:  Patient was evaluated and treated and all questions answered.  S/p foot surgery left -Progressing  as expected post-operatively. -WB Status: NWB in CAM boot -Sutures: intact. -Medications: n/a -Foot redressed  Return in 2 weeks for suture removal.  No follow-ups on file.

## 2023-08-24 ENCOUNTER — Emergency Department (HOSPITAL_BASED_OUTPATIENT_CLINIC_OR_DEPARTMENT_OTHER): Payer: Medicaid Other

## 2023-08-24 ENCOUNTER — Other Ambulatory Visit: Payer: Self-pay

## 2023-08-24 ENCOUNTER — Encounter (HOSPITAL_COMMUNITY): Payer: Self-pay

## 2023-08-24 ENCOUNTER — Emergency Department (HOSPITAL_COMMUNITY)
Admission: EM | Admit: 2023-08-24 | Discharge: 2023-08-25 | Discharge status: Against Medical Advice | Payer: Medicaid Other | Attending: Emergency Medicine | Admitting: Emergency Medicine

## 2023-08-24 DIAGNOSIS — M79605 Pain in left leg: Secondary | ICD-10-CM | POA: Insufficient documentation

## 2023-08-24 DIAGNOSIS — Z5321 Procedure and treatment not carried out due to patient leaving prior to being seen by health care provider: Secondary | ICD-10-CM | POA: Insufficient documentation

## 2023-08-24 DIAGNOSIS — M79662 Pain in left lower leg: Secondary | ICD-10-CM

## 2023-08-24 LAB — CBC
HCT: 38.5 % (ref 36.0–46.0)
Hemoglobin: 12.7 g/dL (ref 12.0–15.0)
MCH: 27.9 pg (ref 26.0–34.0)
MCHC: 33 g/dL (ref 30.0–36.0)
MCV: 84.4 fL (ref 80.0–100.0)
Platelets: 435 10*3/uL — ABNORMAL HIGH (ref 150–400)
RBC: 4.56 MIL/uL (ref 3.87–5.11)
RDW: 14.1 % (ref 11.5–15.5)
WBC: 6.3 10*3/uL (ref 4.0–10.5)
nRBC: 0 % (ref 0.0–0.2)

## 2023-08-24 LAB — BASIC METABOLIC PANEL
Anion gap: 9 (ref 5–15)
BUN: 17 mg/dL (ref 6–20)
CO2: 25 mmol/L (ref 22–32)
Calcium: 8.9 mg/dL (ref 8.9–10.3)
Chloride: 100 mmol/L (ref 98–111)
Creatinine, Ser: 0.86 mg/dL (ref 0.44–1.00)
GFR, Estimated: >60 mL/min (ref >60)
Glucose, Bld: 99 mg/dL (ref 70–99)
Potassium: 4.1 mmol/L (ref 3.5–5.1)
Sodium: 134 mmol/L — ABNORMAL LOW (ref 135–145)

## 2023-08-24 NOTE — ED Triage Notes (Signed)
Pt c/o severe pain left leg from posterior of calf to posterior of knee. Pt states no matter what position she has her leg in, it hurts. Pt has 2+ left pedal pulse, warm to touch, pt able to wiggle toes.

## 2023-08-24 NOTE — ED Provider Triage Note (Signed)
Emergency Medicine Provider Triage Evaluation Note  Ruth [name] , a 48 y.o. female  was evaluated in triage.  Pt complains of left leg pain.  Had recent left Achilles tendon repair surgery on February 7.  Since then she has had pain in the left leg seems to be migrating up the leg.  Does endorse calf tenderness.  Denies chest pain or shortness of breath.  States she does take a daily aspirin but no other blood thinners.  Review of Systems  Positive: See above Negative: See above  Physical Exam  BP 123/89   Pulse 89   Temp 98.1 F (36.7 C) (Oral)   Resp 20   Ht 5\' 6"  (1.676 m)   Wt 113 kg   LMP 07/05/2022   SpO2 100%   BMI 40.21 kg/m  Gen:   Awake, no distress   Resp:  Normal effort  MSK:   Moves extremities without difficulty  Other:    Medical Decision Making  Medically screening exam initiated at 6:25 PM.  Appropriate orders placed.  Ruth [name] was informed that the remainder of the evaluation will be completed by another provider, this initial triage assessment does not replace that evaluation, and the importance of remaining in the ED until their evaluation is complete.  Work up started   Gareth Eagle, PA-C 08/24/23 1825

## 2023-08-25 ENCOUNTER — Encounter (HOSPITAL_COMMUNITY): Payer: Self-pay | Admitting: Podiatry

## 2023-08-25 NOTE — ED Notes (Signed)
Patient decided to leave AMA

## 2023-08-31 ENCOUNTER — Other Ambulatory Visit: Payer: Self-pay

## 2023-08-31 ENCOUNTER — Encounter: Payer: Medicaid Other | Admitting: Podiatry

## 2023-09-03 ENCOUNTER — Other Ambulatory Visit: Payer: Self-pay

## 2023-09-03 ENCOUNTER — Telehealth: Payer: Self-pay | Admitting: Podiatry

## 2023-09-03 ENCOUNTER — Other Ambulatory Visit: Payer: Self-pay | Admitting: Family Medicine

## 2023-09-03 DIAGNOSIS — E1169 Type 2 diabetes mellitus with other specified complication: Secondary | ICD-10-CM

## 2023-09-03 MED ORDER — DEXCOM G7 SENSOR MISC
2 refills | Status: DC
  Filled 2023-09-03: qty 3, 30d supply, fill #0
  Filled 2023-10-09: qty 3, 30d supply, fill #1
  Filled 2023-10-27 – 2023-11-09 (×2): qty 3, 30d supply, fill #2

## 2023-09-03 NOTE — Telephone Encounter (Signed)
 Pt states look like stitches are imbedded and tearing at the site. States she is having a lot of irritation especially when trying to wear boot. Wanted to be seen today but Ralene Cork is in [place]. She does have appt on 3/3 (#2 PO) in [place]. She wanted to know if she will be ok to wait til Monday and if there is anything to do in the meantime.

## 2023-09-06 ENCOUNTER — Encounter: Payer: Self-pay | Admitting: Podiatry

## 2023-09-06 ENCOUNTER — Ambulatory Visit (INDEPENDENT_AMBULATORY_CARE_PROVIDER_SITE_OTHER): Payer: Medicaid Other | Admitting: Podiatry

## 2023-09-06 ENCOUNTER — Other Ambulatory Visit: Payer: Self-pay

## 2023-09-06 DIAGNOSIS — M7661 Achilles tendinitis, right leg: Secondary | ICD-10-CM

## 2023-09-06 DIAGNOSIS — M7662 Achilles tendinitis, left leg: Secondary | ICD-10-CM

## 2023-09-06 DIAGNOSIS — Z9889 Other specified postprocedural states: Secondary | ICD-10-CM

## 2023-09-06 NOTE — Progress Notes (Signed)
 Subjective:  Patient ID: Ruth [name], female    DOB: 06/11/1976,  MRN: [id number]  Chief Complaint  Patient presents with   Routine Post Op    POV #2, DOS 08/13/23, LEFT SECONDARY ACHILLES TENDON REPAIR WITH HAGLUNDS RESECTION " I feel like the stitches are growing into my skin"    DOS: 08/13/23   Procedure: Left haglunds resection with secondary achilles tendon repair    48 y.o. female returns for POV#3. Relates doing well and managing with pain. Ready for stiches to come out.   Review of Systems: Negative except as noted in the HPI. Denies N/V/F/Ch.  Past Medical History:  Diagnosis Date   Abnormal uterine bleeding 2023   Anemia 2023   Anxiety    Arthritis    chronic back pain    Asthma    rarely uses inhaler   Bipolar 1 disorder (HCC)    Follows w/ PCP Dr. Georganna Skeans.   Fibromyalgia    GERD (gastroesophageal reflux disease)    Headache    Hidradenitis    multiple boils, axilla and groin (As of 08/03/22, patient has a boil in her left axilla.)   HLD (hyperlipidemia)    diet controlled   Hx of migraines    occ   Hypertension    no meds   Hypothyroidism    Intentional drug overdose (HCC) 03/28/2021   Obesity    Sleep apnea    Patient states that she had a sleep study in the past and was told she did not need a CPAP.   Thrombocytosis 01/26/2020   Type 2 diabetes mellitus (HCC)    Type 2, Follows w/ PCP Dr. Georganna Skeans.   Wears glasses     Current Outpatient Medications:    albuterol (PROVENTIL) (2.5 MG/3ML) 0.083% nebulizer solution, Take 3 mLs (2.5 mg total) by nebulization every 6 (six) hours as needed for wheezing or shortness of breath., Disp: 75 mL, Rfl: 12   albuterol (VENTOLIN HFA) 108 (90 Base) MCG/ACT inhaler, Inhale 2 puffs into the lungs every 4 (four) hours as needed for wheezing or shortness of breath., Disp: 18 g, Rfl: 5   aspirin EC 81 MG tablet, Take 1 tablet (81 mg total) by mouth in the morning and at bedtime. Swallow whole., Disp:  60 tablet, Rfl: 0   atorvastatin (LIPITOR) 20 MG tablet, Take 1 tablet (20 mg total) by mouth daily., Disp: 90 tablet, Rfl: 0   celecoxib (CELEBREX) 100 MG capsule, TAKE 1 CAPSULE BY MOUTH TWICE A DAY, Disp: 60 capsule, Rfl: 0   Continuous Glucose Receiver (DEXCOM G7 RECEIVER) DEVI, Use to check blood sugar three times a day ., Disp: 1 each, Rfl: 0   Continuous Glucose Sensor (DEXCOM G7 SENSOR) MISC, Use to check blood sugar three time daily. Change sensors once every 10 days., Disp: 3 each, Rfl: 2   fluticasone (FLONASE) 50 MCG/ACT nasal spray, Place 1 spray into both nostrils daily. (Patient taking differently: Place 1 spray into both nostrils daily as needed for allergies or rhinitis.), Disp: 9.9 mL, Rfl: 2   fluticasone (FLOVENT HFA) 110 MCG/ACT inhaler, Inhale 2 puffs into the lungs 2 (two) times daily., Disp: 1 each, Rfl: 5   hydrOXYzine (ATARAX) 25 MG tablet, Take 1 tablet (25 mg total) by mouth 3 (three) times daily as needed., Disp: 30 tablet, Rfl: 0   ibuprofen (ADVIL) 800 MG tablet, Take 1 tablet (800 mg total) by mouth 3 (three) times daily., Disp: 21 tablet, Rfl: 0  Iron, Ferrous Sulfate, 325 (65 Fe) MG TABS, Take 325 mg by mouth daily., Disp: 90 tablet, Rfl: 1   meloxicam (MOBIC) 15 MG tablet, Take 1 tablet (15 mg total) by mouth daily., Disp: 30 tablet, Rfl: 0   miconazole (MICOTIN) 2 % cream, Apply 1 Application topically 2 (two) times daily., Disp: 60 g, Rfl: 0   montelukast (SINGULAIR) 10 MG tablet, Take 1 tablet (10 mg total) by mouth at bedtime., Disp: 90 tablet, Rfl: 1   Multiple Vitamin (MULTIVITAMIN) capsule, Take 2 capsules by mouth daily., Disp: , Rfl:    mupirocin ointment (BACTROBAN) 2 %, Apply 1 Application topically 2 (two) times daily., Disp: 22 g, Rfl: 0   nystatin cream (MYCOSTATIN), Apply 1 Application topically 2 (two) times daily., Disp: 60 g, Rfl: 0   OLANZapine (ZYPREXA) 5 MG tablet, Take 1 tablet (5 mg total) by mouth at bedtime., Disp: 90 tablet, Rfl: 0    ondansetron (ZOFRAN) 4 MG tablet, Take 1 tablet (4 mg total) by mouth every 6 (six) hours., Disp: 12 tablet, Rfl: 0   ondansetron (ZOFRAN) 4 MG tablet, Take 1 tablet (4 mg total) by mouth every 8 (eight) hours as needed for nausea or vomiting., Disp: 20 tablet, Rfl: 0   oxyCODONE (ROXICODONE) 5 MG immediate release tablet, Take 1 tablet (5 mg total) by mouth every 4 (four) hours as needed for severe pain (pain score 7-10)., Disp: 30 tablet, Rfl: 0   silver sulfADIAZINE (SILVADENE) 1 % cream, Apply 1 Application topically daily. Apply 2-3 times daily to the left hand, cover when at work otherwise may leave open to air., Disp: 50 g, Rfl: 0   tirzepatide (MOUNJARO) 10 MG/0.5ML Pen, Inject 10 mg into the skin once a week., Disp: 2 mL, Rfl: 0   tirzepatide (MOUNJARO) 12.5 MG/0.5ML Pen, Inject 12.5 mg into the skin once a week., Disp: 2 mL, Rfl: 0   tirzepatide (MOUNJARO) 15 MG/0.5ML Pen, Inject 15 mg into the skin once a week., Disp: 6 mL, Rfl: 1   cetirizine (ZYRTEC ALLERGY) 10 MG tablet, Take 1 tablet (10 mg total) by mouth daily. (Patient taking differently: Take 10 mg by mouth daily as needed for allergies.), Disp: 30 tablet, Rfl: 2  Social History   Tobacco Use  Smoking Status Every Day   Current packs/day: 0.25   Types: Cigarettes  Smokeless Tobacco Never  Tobacco Comments   Per patient, she only smokes 2 to 3 cigarettes daily as of 08/03/22.    Allergies  Allergen Reactions   Metformin And Related Diarrhea   Penicillins Other (See Comments)    Unknown; from childhood Has patient had a PCN reaction causing immediate rash, facial/tongue/throat swelling, SOB or lightheadedness with hypotension: Unknow Has patient had a PCN reaction causing severe rash involving mucus membranes or skin necrosis: Unknown Has patient had a PCN reaction that required hospitalization: Unknown Has patient had a PCN reaction occurring within the last 10 years: Unknown If all of the above answers are "NO", then  may proceed with Cephalosporin use.    Victoza [Liraglutide] Itching   Objective:  There were no vitals filed for this visit. There is no height or weight on file to calculate BMI. Constitutional Well developed. Well nourished.  Vascular Foot warm and well perfused. Capillary refill normal to all digits.   Neurologic Normal speech. Oriented to person, place, and time. Epicritic sensation to light touch grossly present bilaterally.  Dermatologic Skin healing well without signs of infection. Skin edges well coapted without  signs of infection.  Orthopedic: Tenderness to palpation noted about the surgical site.   Radiographs: Interval resection of spurring and haglunds deformity posterior calcaneus  Assessment:   No diagnosis found.  Plan:  Patient was evaluated and treated and all questions answered.  S/p foot surgery left -Progressing as expected post-operatively. -WB Status: NWB in CAM boot for one more week then may begin WBAT in CAM boot for next two weeks.  -Sutures: removed today without incident. . -Medications: n/a -Foot redressed  Return in 3 weeks for recheck  No follow-ups on file.

## 2023-09-13 ENCOUNTER — Ambulatory Visit (INDEPENDENT_AMBULATORY_CARE_PROVIDER_SITE_OTHER): Payer: Medicaid Other | Admitting: Family Medicine

## 2023-09-13 ENCOUNTER — Encounter: Payer: Self-pay | Admitting: Family Medicine

## 2023-09-13 VITALS — BP 125/86 | HR 75 | Temp 97.4°F | Resp 16 | Ht 66.0 in | Wt 253.4 lb

## 2023-09-13 DIAGNOSIS — F419 Anxiety disorder, unspecified: Secondary | ICD-10-CM

## 2023-09-13 DIAGNOSIS — Z7985 Long-term (current) use of injectable non-insulin antidiabetic drugs: Secondary | ICD-10-CM

## 2023-09-13 DIAGNOSIS — E1169 Type 2 diabetes mellitus with other specified complication: Secondary | ICD-10-CM

## 2023-09-13 DIAGNOSIS — F17200 Nicotine dependence, unspecified, uncomplicated: Secondary | ICD-10-CM

## 2023-09-13 DIAGNOSIS — J4540 Moderate persistent asthma, uncomplicated: Secondary | ICD-10-CM | POA: Diagnosis not present

## 2023-09-13 DIAGNOSIS — F32A Depression, unspecified: Secondary | ICD-10-CM

## 2023-09-13 MED ORDER — ALBUTEROL SULFATE (2.5 MG/3ML) 0.083% IN NEBU: 2.5000 mg | INHALATION_SOLUTION | Freq: Four times a day (QID) | RESPIRATORY_TRACT | 12 refills | Status: AC | PRN

## 2023-09-13 MED ORDER — CETIRIZINE HCL 10 MG PO TABS: 10.0000 mg | ORAL_TABLET | Freq: Every day | ORAL | 1 refills | Status: DC

## 2023-09-13 MED ORDER — FLUTICASONE PROPIONATE 50 MCG/ACT NA SUSP: 1.0000 | Freq: Every day | NASAL | 2 refills | Status: AC

## 2023-09-13 MED ORDER — ALBUTEROL SULFATE HFA 108 (90 BASE) MCG/ACT IN AERS
2.0000 | INHALATION_SPRAY | RESPIRATORY_TRACT | 5 refills | Status: AC | PRN
Start: 2023-09-13 — End: ?

## 2023-09-13 MED ORDER — SERTRALINE HCL 50 MG PO TABS
50.0000 mg | ORAL_TABLET | Freq: Every day | ORAL | 0 refills | Status: DC
Start: 2023-09-13 — End: 2024-01-12

## 2023-09-13 MED ORDER — FLUTICASONE PROPIONATE HFA 110 MCG/ACT IN AERO: 2.0000 | INHALATION_SPRAY | Freq: Two times a day (BID) | RESPIRATORY_TRACT | 5 refills | Status: DC

## 2023-09-13 MED ORDER — MONTELUKAST SODIUM 10 MG PO TABS: 10.0000 mg | ORAL_TABLET | Freq: Every day | ORAL | 1 refills | Status: DC

## 2023-09-13 NOTE — Progress Notes (Unsigned)
 Established Patient Office Visit  Subjective    Patient ID: Ruth [name], female    DOB: 1975/09/26  Age: 48 y.o. MRN: [id number]  CC:  Chief Complaint  Patient presents with   Follow-up    4 month    HPI Ruth [name] presents for routine follow up of chronic med issues including diabetes, asthma, ad anxiety/depression. Patient reports med compliance and denies acute complaints.   Outpatient Encounter Medications as of 09/13/2023  Medication Sig   atorvastatin (LIPITOR) 20 MG tablet Take 1 tablet (20 mg total) by mouth daily.   celecoxib (CELEBREX) 100 MG capsule TAKE 1 CAPSULE BY MOUTH TWICE A DAY   Continuous Glucose Receiver (DEXCOM G7 RECEIVER) DEVI Use to check blood sugar three times a day .   Continuous Glucose Sensor (DEXCOM G7 SENSOR) MISC Use to check blood sugar three time daily. Change sensors once every 10 days.   ibuprofen (ADVIL) 800 MG tablet Take 1 tablet (800 mg total) by mouth 3 (three) times daily.   Iron, Ferrous Sulfate, 325 (65 Fe) MG TABS Take 325 mg by mouth daily.   meloxicam (MOBIC) 15 MG tablet Take 1 tablet (15 mg total) by mouth daily.   miconazole (MICOTIN) 2 % cream Apply 1 Application topically 2 (two) times daily.   Multiple Vitamin (MULTIVITAMIN) capsule Take 2 capsules by mouth daily.   mupirocin ointment (BACTROBAN) 2 % Apply 1 Application topically 2 (two) times daily.   nystatin cream (MYCOSTATIN) Apply 1 Application topically 2 (two) times daily.   OLANZapine (ZYPREXA) 5 MG tablet Take 1 tablet (5 mg total) by mouth at bedtime.   ondansetron (ZOFRAN) 4 MG tablet Take 1 tablet (4 mg total) by mouth every 6 (six) hours.   ondansetron (ZOFRAN) 4 MG tablet Take 1 tablet (4 mg total) by mouth every 8 (eight) hours as needed for nausea or vomiting.   oxyCODONE (ROXICODONE) 5 MG immediate release tablet Take 1 tablet (5 mg total) by mouth every 4 (four) hours as needed for severe pain (pain score 7-10).   sertraline (ZOLOFT)  50 MG tablet Take 1 tablet (50 mg total) by mouth daily.   silver sulfADIAZINE (SILVADENE) 1 % cream Apply 1 Application topically daily. Apply 2-3 times daily to the left hand, cover when at work otherwise may leave open to air.   tirzepatide [facility]) 10 MG/0.5ML Pen Inject 10 mg into the skin once a week.   tirzepatide (MOUNJARO) 12.5 MG/0.5ML Pen Inject 12.5 mg into the skin once a week.   tirzepatide (MOUNJARO) 15 MG/0.5ML Pen Inject 15 mg into the skin once a week.   [DISCONTINUED] albuterol (PROVENTIL) (2.5 MG/3ML) 0.083% nebulizer solution Take 3 mLs (2.5 mg total) by nebulization every 6 (six) hours as needed for wheezing or shortness of breath.   [DISCONTINUED] albuterol (VENTOLIN HFA) 108 (90 Base) MCG/ACT inhaler Inhale 2 puffs into the lungs every 4 (four) hours as needed for wheezing or shortness of breath.   [DISCONTINUED] fluticasone (FLONASE) 50 MCG/ACT nasal spray Place 1 spray into both nostrils daily. (Patient taking differently: Place 1 spray into both nostrils daily as needed for allergies or rhinitis.)   [DISCONTINUED] fluticasone (FLOVENT HFA) 110 MCG/ACT inhaler Inhale 2 puffs into the lungs 2 (two) times daily.   [DISCONTINUED] montelukast (SINGULAIR) 10 MG tablet Take 1 tablet (10 mg total) by mouth at bedtime.   albuterol (PROVENTIL) (2.5 MG/3ML) 0.083% nebulizer solution Take 3 mLs (2.5 mg total) by nebulization every 6 (six) hours as needed  for wheezing or shortness of breath.   albuterol (VENTOLIN HFA) 108 (90 Base) MCG/ACT inhaler Inhale 2 puffs into the lungs every 4 (four) hours as needed for wheezing or shortness of breath.   cetirizine (ZYRTEC ALLERGY) 10 MG tablet Take 1 tablet (10 mg total) by mouth daily.   fluticasone (FLONASE) 50 MCG/ACT nasal spray Place 1 spray into both nostrils daily.   fluticasone (FLOVENT HFA) 110 MCG/ACT inhaler Inhale 2 puffs into the lungs 2 (two) times daily.   hydrOXYzine (ATARAX) 25 MG tablet Take 1 tablet (25 mg total) by mouth 3  (three) times daily as needed. (Patient not taking: Reported on 09/13/2023)   montelukast (SINGULAIR) 10 MG tablet Take 1 tablet (10 mg total) by mouth at bedtime.   [DISCONTINUED] cetirizine (ZYRTEC ALLERGY) 10 MG tablet Take 1 tablet (10 mg total) by mouth daily. (Patient taking differently: Take 10 mg by mouth daily as needed for allergies.)   No facility-administered encounter medications on file as of 09/13/2023.    Past Medical History:  Diagnosis Date   Abnormal uterine bleeding 2023   Anemia 2023   Anxiety    Arthritis    chronic back pain    Asthma    rarely uses inhaler   Bipolar 1 disorder (HCC)    Follows w/ PCP Dr. Georganna Skeans.   Fibromyalgia    GERD (gastroesophageal reflux disease)    Headache    Hidradenitis    multiple boils, axilla and groin (As of 08/03/22, patient has a boil in her left axilla.)   HLD (hyperlipidemia)    diet controlled   Hx of migraines    occ   Hypertension    no meds   Hypothyroidism    Intentional drug overdose (HCC) 03/28/2021   Obesity    Sleep apnea    Patient states that she had a sleep study in the past and was told she did not need a CPAP.   Thrombocytosis 01/26/2020   Type 2 diabetes mellitus (HCC)    Type 2, Follows w/ PCP Dr. Georganna Skeans.   Wears glasses     Past Surgical History:  Procedure Laterality Date   ACHILLES TENDON SURGERY Left 08/13/2023   Procedure: ACHILLES TENDON REPAIR;  Surgeon: Louann Sjogren, DPM;  Location: [facility];  Service: Orthopedics/Podiatry;  Laterality: Left;   ANTERIOR CERVICAL DECOMP/DISCECTOMY FUSION N/A 01/16/2021   Procedure: Cervical five-six Anterior cervical decompression/discectomy/fusion;  Surgeon: Lisbeth Renshaw, MD;  Location: [facility];  Service: Neurosurgery;  Laterality: N/A;   CARPAL TUNNEL RELEASE Right    around 2009   CESAREAN SECTION     x2, 2003 & 2008   CYSTOSCOPY N/A 08/12/2022   Procedure: CYSTOSCOPY;  Surgeon: Lorriane Shire, MD;  Location: [facility];  Service: Gynecology;  Laterality: N/A;   DILITATION & CURRETTAGE/HYSTROSCOPY WITH NOVASURE ABLATION N/A 12/07/2017   Procedure: DILATATION & CURETTAGE/HYSTEROSCOPY WITH ATTEMPED NOVASURE ABLATION, FAILED/ENDOMETERIAL ABLATION WITH HTA SYSTEM;  Surgeon: Willodean Rosenthal, MD;  Location: [facility];  Service: Gynecology;  Laterality: N/A;   ELBOW SURGERY Left    "surgery for nerve pain" around 2009   ENDOMETRIAL BIOPSY  07/23/2022   negative for hyperplasia or malignancy   GANGLION CYST EXCISION Right    right arm    OSTECTOMY Left 08/13/2023   Procedure: CALCANEAL OSTECTOMY;  Surgeon: Louann Sjogren, DPM;  Location: [facility];  Service: Orthopedics/Podiatry;  Laterality: Left;  POPLITEAL/SAPHENOUS BLOCK   RECTOVAGINAL FISTULA CLOSURE     in 1308,6578, 2006  ROBOTIC ASSISTED TOTAL HYSTERECTOMY Bilateral 08/12/2022   Procedure: XI ROBOTIC ASSISTED TOTAL HYSTERECTOMY WITH SALPINGECTOMY;  Surgeon: Lorriane Shire, MD;  Location:  SURGERY CENTER;  Service: Gynecology;  Laterality: Bilateral;   TONSILLECTOMY     childhood   TUBAL LIGATION  2008   XI ROBOT ASSISTED RECTOPEXY N/A 08/12/2022   Procedure: XI robot repair serosal repair of rectum;  Surgeon: Karie Soda, MD;  Location: [facility];  Service: General;  Laterality: N/A;    Family History  Problem Relation Age of Onset   Thyroid disease Mother    Diabetes Daughter    Diabetes Son    Breast cancer Neg Hx     Social History   Socioeconomic History   Marital status: Married    Spouse name: Not on file   Number of children: Not on file   Years of education: Not on file   Highest education level: GED or equivalent  Occupational History   Not on file  Tobacco Use   Smoking status: Every Day    Current packs/day: 0.25    Types: Cigarettes   Smokeless tobacco: Never   Tobacco comments:    Per patient, she only smokes 2 to 3 cigarettes daily as of 08/03/22.  Vaping Use   Vaping status:  Never Used  Substance and Sexual Activity   Alcohol use: Not Currently   Drug use: Yes    Types: Marijuana    Comment: Occasional use only   Sexual activity: Yes    Birth control/protection: Surgical    Comment: tubal ligation  Other Topics Concern   Not on file  Social History Narrative   Not on file   Social Drivers of Health   Financial Resource Strain: Medium Risk (08/09/2023)   Overall Financial Resource Strain (CARDIA)    Difficulty of Paying Living Expenses: Somewhat hard  Food Insecurity: Food Insecurity Present (08/09/2023)   Hunger Vital Sign    Worried About Running Out of Food in the Last Year: Never true    Ran Out of Food in the Last Year: Sometimes true  Transportation Needs: No Transportation Needs (08/09/2023)   PRAPARE - Administrator, Civil Service (Medical): No    Lack of Transportation (Non-Medical): No  Physical Activity: Insufficiently Active (08/09/2023)   Exercise Vital Sign    Days of Exercise per Week: 3 days    Minutes of Exercise per Session: 30 min  Stress: No Stress Concern Present (08/09/2023)   [employer] of Occupational Health - Occupational Stress Questionnaire    Feeling of Stress : Only a little  Social Connections: Moderately Isolated (08/09/2023)   Social Connection and Isolation Panel [NHANES]    Frequency of Communication with Friends and Family: More than three times a week    Frequency of Social Gatherings with Friends and Family: Twice a week    Attends Religious Services: Never    Database administrator or Organizations: No    Attends Engineer, structural: Not on file    Marital Status: Married  Catering manager Violence: Not At Risk (05/14/2023)   Humiliation, Afraid, Rape, and Kick questionnaire    Fear of Current or Ex-Partner: No    Emotionally Abused: No    Physically Abused: No    Sexually Abused: No    Review of Systems  All other systems reviewed and are negative.       Objective    BP  125/86   Pulse 75  Temp (!) 97.4 F (36.3 C) (Oral)   Resp 16   Ht 5\' 6"  (1.676 m)   Wt 253 lb 6.4 oz (114.9 kg) Comment: patient has a boot from surgery  LMP 07/05/2022   SpO2 97%   BMI 40.90 kg/m   Physical Exam Vitals and nursing note reviewed.  Constitutional:      General: She is not in acute distress.    Appearance: She is obese.  Cardiovascular:     Rate and Rhythm: Normal rate and regular rhythm.  Pulmonary:     Effort: Pulmonary effort is normal.     Breath sounds: Normal breath sounds.  Abdominal:     Palpations: Abdomen is soft.     Tenderness: There is no abdominal tenderness.  Neurological:     General: No focal deficit present.     Mental Status: She is alert and oriented to person, place, and time.         Assessment & Plan:   1. Type 2 diabetes mellitus with other specified complication, without long-term current use of insulin (HCC) (Primary) Recent A1c at goal. continue  2. Long-term current use of injectable noninsulin antidiabetic medication   3. Anxiety and depression Appears stable. Continue present management  4. Moderate persistent reactive airway disease without complication Appears stable. Meds refilled.   5. Smoking Discussed reduction/cessation   Return in about 4 weeks (around 10/11/2023) for follow up, chronic med issues.   Tommie Raymond, MD

## 2023-09-14 ENCOUNTER — Encounter: Payer: Self-pay | Admitting: Family Medicine

## 2023-09-26 NOTE — Progress Notes (Unsigned)
 S:     No chief complaint on file.  48 y.o. female who presents for diabetes evaluation, education, and management. PMH is significant for T2DM and HLD.   Patient was referred by Primary Care Provider, Dr. Andrey Campanile, on 05/14/2023. Patient was last seen by pharmacy on 07/29/23. At that visit, we continued to titrate her [facility] and sent prescriptions for the 12.5 mg and 15 mg doses for further weight loss, as A1C was controlled at 6.1%.  Patient had achilles tendon repair in Feb 2025. Last seen by Dr. Andrey Campanile on 09/13/23 - no medication changes made.   Today, patient arrives in good spirits and presents without  any assistance. Patient denies N/V or abdominal pain with Mounjaro.*** Her A1c today shows continued good glycemic control.   Family/Social History:  DM- daughter, brother, sister  No family or personal history of thyroid cancer (mother has history of hyperthyroidism requiring radiation).   Current diabetes medications include: Mounjaro 12.5 mg weekly  Patient reports adherence to taking all medications as prescribed.   Insurance coverage: Medicaid   Patient denies hypoglycemic events.***  Patient denies nocturia (nighttime urination). *** Patient reports neuropathy (nerve pain). Patient denies visual changes. Patient reports self foot exams.   Dietary habits: 2 meals a day*** -Appetite  -Has increased her water intake since last visit. Still will drink regular soda occasionally.  -snack: sandwiches, tacos   Exercise:  -Tries to do some walking (limited due to pain) - currently in a boot s/p achilles tendon surgery ***  O:  BP Readings from Last 3 Encounters:  09/13/23 125/86  08/24/23 (!) 141/96  08/13/23 131/87    Lab Results  Component Value Date   HGBA1C 6.0 07/29/2023   There were no vitals filed for this visit.  Lipid Panel     Component Value Date/Time   CHOL 213 (H) 05/14/2023 1048   TRIG 93 05/14/2023 1048   HDL 43 05/14/2023 1048   CHOLHDL 5.0  (H) 05/14/2023 1048   LDLCALC 153 (H) 05/14/2023 1048    Clinical Atherosclerotic Cardiovascular Disease (ASCVD): No  The 10-year ASCVD risk score (Arnett DK, et al., 2019) is: 10.9%   Values used to calculate the score:     Age: 21 years     Sex: Female     Is Non-Hispanic African American: Yes     Diabetic: Yes     Tobacco smoker: Yes     Systolic Blood Pressure: 125 mmHg     Is BP treated: No     HDL Cholesterol: 43 mg/dL     Total Cholesterol: 213 mg/dL   Patient is participating in a Managed Medicaid Plan:  Yes   Statin adherence - atorva 20 filled 90 ds on 05/19/23 Dexcom G7 Filling Mounjaro 12.5 mg (previously on insulin) LDL-C high - switch to high intensity statin today Due for UACR - 6 one year ago, Scr good Check BP?    A/P: Diabetes longstanding currently controlled based on A1c of 6.0% today. Commended her for this. We will continue to titrate [facility] for weight loss effect. She has tolerated this well up to this point. She has been instructed to skip her Mounjaro dose due on 08/03/23. She will start a week after her procedure on 08/17/2023.  Patient is able to verbalize appropriate hypoglycemia management plan. Medication adherence appears optimal.   -Continue Mounjaro 10 mg weekly for now. Skip next week's dose and the dose that you would have taken on 08/10/2023 (procedure day). Resume on  08/17/2023. Rxns sent for the 12.5 mg and 15 mg doses for her to complete and eventually transition to for weight loss effect. -Patient educated on purpose, proper use, and potential adverse effects of medications.  -Extensively discussed pathophysiology of diabetes, recommended lifestyle interventions, dietary effects on blood sugar control.  -Counseled on s/sx of and management of hypoglycemia.  -Next A1c anticipated 10/2023.   Written patient instructions provided. Patient verbalized understanding of treatment plan.  Total time in face to face counseling 30 minutes.    Follow-up:   Pharmacist: 2 months.***  Nils Pyle, PharmD PGY1 Pharmacy Resident  Butch Penny, PharmD, BCACP, CPP Clinical Pharmacist [facility] [phone]

## 2023-09-27 ENCOUNTER — Ambulatory Visit (INDEPENDENT_AMBULATORY_CARE_PROVIDER_SITE_OTHER): Admitting: Podiatry

## 2023-09-27 ENCOUNTER — Ambulatory Visit: Payer: Medicaid Other | Attending: Family Medicine | Admitting: Pharmacist

## 2023-09-27 ENCOUNTER — Other Ambulatory Visit: Payer: Self-pay

## 2023-09-27 ENCOUNTER — Encounter: Payer: Self-pay | Admitting: Podiatry

## 2023-09-27 ENCOUNTER — Encounter: Payer: Self-pay | Admitting: Pharmacist

## 2023-09-27 DIAGNOSIS — E1142 Type 2 diabetes mellitus with diabetic polyneuropathy: Secondary | ICD-10-CM | POA: Diagnosis not present

## 2023-09-27 DIAGNOSIS — E785 Hyperlipidemia, unspecified: Secondary | ICD-10-CM

## 2023-09-27 DIAGNOSIS — Z9889 Other specified postprocedural states: Secondary | ICD-10-CM

## 2023-09-27 DIAGNOSIS — M7662 Achilles tendinitis, left leg: Secondary | ICD-10-CM

## 2023-09-27 DIAGNOSIS — E1169 Type 2 diabetes mellitus with other specified complication: Secondary | ICD-10-CM | POA: Diagnosis not present

## 2023-09-27 DIAGNOSIS — Z7985 Long-term (current) use of injectable non-insulin antidiabetic drugs: Secondary | ICD-10-CM | POA: Diagnosis not present

## 2023-09-27 DIAGNOSIS — M7661 Achilles tendinitis, right leg: Secondary | ICD-10-CM

## 2023-09-27 MED ORDER — TIRZEPATIDE 12.5 MG/0.5ML ~~LOC~~ SOAJ
12.5000 mg | SUBCUTANEOUS | 1 refills | Status: DC
  Filled 2023-09-27: qty 2, 28d supply, fill #0

## 2023-09-27 MED ORDER — OMEPRAZOLE 40 MG PO CPDR
40.0000 mg | DELAYED_RELEASE_CAPSULE | Freq: Every day | ORAL | 1 refills | Status: DC
  Filled 2023-09-27: qty 90, 90d supply, fill #0
  Filled 2023-12-24: qty 90, 90d supply, fill #1

## 2023-09-27 MED ORDER — ATORVASTATIN CALCIUM 40 MG PO TABS
40.0000 mg | ORAL_TABLET | Freq: Every day | ORAL | 3 refills | Status: DC
  Filled 2023-09-27: qty 90, 90d supply, fill #0
  Filled 2023-12-24: qty 90, 90d supply, fill #1

## 2023-09-27 NOTE — Patient Instructions (Signed)
 It was great to see you today!!  We sent a refill of Mounjaro 12.5 mg weekly in case you want to stay on this dose a little longer before increasing to 15 mg weekly.   We also sent a prescription for Omeprazole (Prilosec) 40 mg once daily for you to take to help with belching and stomach acid with Mounjaro. Try taking this for a couple weeks as you transition to the higher dose, and then start to wean yourself off by taking 1 tablet every other day, then every couple days.   Start taking atorvastatin 40 mg daily for cholesterol. We will recheck your labs when you come back.   We will see you in a couple months!

## 2023-09-27 NOTE — Progress Notes (Signed)
 Subjective:  Patient ID: Ruth [name], female    DOB: 1976-03-04,  MRN: [id number]  No chief complaint on file.   DOS: 08/13/23   Procedure: Left haglunds resection with secondary achilles tendon repair    48 y.o. female returns for POV#4. Relates doing well and managing with pain.  Review of Systems: Negative except as noted in the HPI. Denies N/V/F/Ch.  Past Medical History:  Diagnosis Date   Abnormal uterine bleeding 2023   Anemia 2023   Anxiety    Arthritis    chronic back pain    Asthma    rarely uses inhaler   Bipolar 1 disorder (HCC)    Follows w/ PCP Dr. Georganna Skeans.   Fibromyalgia    GERD (gastroesophageal reflux disease)    Headache    Hidradenitis    multiple boils, axilla and groin (As of 08/03/22, patient has a boil in her left axilla.)   HLD (hyperlipidemia)    diet controlled   Hx of migraines    occ   Hypertension    no meds   Hypothyroidism    Intentional drug overdose (HCC) 03/28/2021   Obesity    Sleep apnea    Patient states that she had a sleep study in the past and was told she did not need a CPAP.   Thrombocytosis 01/26/2020   Type 2 diabetes mellitus (HCC)    Type 2, Follows w/ PCP Dr. Georganna Skeans.   Wears glasses     Current Outpatient Medications:    albuterol (PROVENTIL) (2.5 MG/3ML) 0.083% nebulizer solution, Take 3 mLs (2.5 mg total) by nebulization every 6 (six) hours as needed for wheezing or shortness of breath., Disp: 75 mL, Rfl: 12   albuterol (VENTOLIN HFA) 108 (90 Base) MCG/ACT inhaler, Inhale 2 puffs into the lungs every 4 (four) hours as needed for wheezing or shortness of breath., Disp: 18 g, Rfl: 5   atorvastatin (LIPITOR) 40 MG tablet, Take 1 tablet (40 mg total) by mouth daily., Disp: 90 tablet, Rfl: 3   celecoxib (CELEBREX) 100 MG capsule, TAKE 1 CAPSULE BY MOUTH TWICE A DAY, Disp: 60 capsule, Rfl: 0   cetirizine (ZYRTEC ALLERGY) 10 MG tablet, Take 1 tablet (10 mg total) by mouth daily., Disp: 90 tablet, Rfl:  1   Continuous Glucose Sensor (DEXCOM G7 SENSOR) MISC, Use to check blood sugar three time daily. Change sensors once every 10 days., Disp: 3 each, Rfl: 2   fluticasone (FLONASE) 50 MCG/ACT nasal spray, Place 1 spray into both nostrils daily., Disp: 9.9 mL, Rfl: 2   fluticasone (FLOVENT HFA) 110 MCG/ACT inhaler, Inhale 2 puffs into the lungs 2 (two) times daily., Disp: 1 each, Rfl: 5   hydrOXYzine (ATARAX) 25 MG tablet, Take 1 tablet (25 mg total) by mouth 3 (three) times daily as needed. (Patient not taking: Reported on 09/13/2023), Disp: 30 tablet, Rfl: 0   ibuprofen (ADVIL) 800 MG tablet, Take 1 tablet (800 mg total) by mouth 3 (three) times daily., Disp: 21 tablet, Rfl: 0   Iron, Ferrous Sulfate, 325 (65 Fe) MG TABS, Take 325 mg by mouth daily., Disp: 90 tablet, Rfl: 1   meloxicam (MOBIC) 15 MG tablet, Take 1 tablet (15 mg total) by mouth daily., Disp: 30 tablet, Rfl: 0   miconazole (MICOTIN) 2 % cream, Apply 1 Application topically 2 (two) times daily., Disp: 60 g, Rfl: 0   montelukast (SINGULAIR) 10 MG tablet, Take 1 tablet (10 mg total) by mouth at bedtime., Disp: 90 tablet,  Rfl: 1   Multiple Vitamin (MULTIVITAMIN) capsule, Take 2 capsules by mouth daily., Disp: , Rfl:    mupirocin ointment (BACTROBAN) 2 %, Apply 1 Application topically 2 (two) times daily., Disp: 22 g, Rfl: 0   nystatin cream (MYCOSTATIN), Apply 1 Application topically 2 (two) times daily., Disp: 60 g, Rfl: 0   OLANZapine (ZYPREXA) 5 MG tablet, Take 1 tablet (5 mg total) by mouth at bedtime., Disp: 90 tablet, Rfl: 0   omeprazole (PRILOSEC) 40 MG capsule, Take 1 capsule (40 mg total) by mouth daily., Disp: 90 capsule, Rfl: 1   ondansetron (ZOFRAN) 4 MG tablet, Take 1 tablet (4 mg total) by mouth every 6 (six) hours., Disp: 12 tablet, Rfl: 0   ondansetron (ZOFRAN) 4 MG tablet, Take 1 tablet (4 mg total) by mouth every 8 (eight) hours as needed for nausea or vomiting., Disp: 20 tablet, Rfl: 0   oxyCODONE (ROXICODONE) 5 MG  immediate release tablet, Take 1 tablet (5 mg total) by mouth every 4 (four) hours as needed for severe pain (pain score 7-10)., Disp: 30 tablet, Rfl: 0   sertraline (ZOLOFT) 50 MG tablet, Take 1 tablet (50 mg total) by mouth daily., Disp: 90 tablet, Rfl: 0   silver sulfADIAZINE (SILVADENE) 1 % cream, Apply 1 Application topically daily. Apply 2-3 times daily to the left hand, cover when at work otherwise may leave open to air., Disp: 50 g, Rfl: 0   tirzepatide (MOUNJARO) 12.5 MG/0.5ML Pen, Inject 12.5 mg into the skin once a week., Disp: 2 mL, Rfl: 1   tirzepatide (MOUNJARO) 15 MG/0.5ML Pen, Inject 15 mg into the skin once a week., Disp: 6 mL, Rfl: 1  Social History   Tobacco Use  Smoking Status Every Day   Current packs/day: 0.25   Types: Cigarettes  Smokeless Tobacco Never  Tobacco Comments   Per patient, she only smokes 2 to 3 cigarettes daily as of 08/03/22.    Allergies  Allergen Reactions   Metformin And Related Diarrhea   Penicillins Other (See Comments)    Unknown; from childhood Has patient had a PCN reaction causing immediate rash, facial/tongue/throat swelling, SOB or lightheadedness with hypotension: Unknow Has patient had a PCN reaction causing severe rash involving mucus membranes or skin necrosis: Unknown Has patient had a PCN reaction that required hospitalization: Unknown Has patient had a PCN reaction occurring within the last 10 years: Unknown If all of the above answers are "NO", then may proceed with Cephalosporin use.    Victoza [Liraglutide] Itching   Objective:  There were no vitals filed for this visit. There is no height or weight on file to calculate BMI. Constitutional Well developed. Well nourished.  Vascular Foot warm and well perfused. Capillary refill normal to all digits.   Neurologic Normal speech. Oriented to person, place, and time. Epicritic sensation to light touch grossly present bilaterally.  Dermatologic Skin healing well without signs  of infection. Skin edges well coapted without signs of infection.  Orthopedic: Tenderness to palpation noted about the surgical site.   Radiographs: Interval resection of spurring and haglunds deformity posterior calcaneus  Assessment:   1. Status post surgery   2. Achilles tendonitis, bilateral     Plan:  Patient was evaluated and treated and all questions answered.  S/p foot surgery left -Progressing as expected post-operatively. -WB Status: May begin WBAT in regular shoes. Short CAM boot dispensed to aid in transition at patient request.  -Medications: n/a -Will consider planning for surgery toward end of May  for left foot.    Return in 6 weeks for recheck  Return in about 6 weeks (around 11/08/2023) for post op.

## 2023-10-08 ENCOUNTER — Other Ambulatory Visit: Payer: Self-pay

## 2023-10-08 ENCOUNTER — Telehealth: Payer: Self-pay | Admitting: Family Medicine

## 2023-10-08 ENCOUNTER — Other Ambulatory Visit: Payer: Self-pay | Admitting: Pharmacist

## 2023-10-08 ENCOUNTER — Ambulatory Visit: Payer: Self-pay

## 2023-10-08 MED ORDER — ONDANSETRON HCL 4 MG PO TABS
4.0000 mg | ORAL_TABLET | Freq: Three times a day (TID) | ORAL | 0 refills | Status: AC | PRN
Start: 2023-10-08 — End: ?

## 2023-10-08 MED ORDER — TIRZEPATIDE 7.5 MG/0.5ML ~~LOC~~ SOAJ: 7.5000 mg | SUBCUTANEOUS | 0 refills | Status: DC

## 2023-10-08 NOTE — Telephone Encounter (Signed)
 The patient was requesting to speak to the Pharmacist [facility] regarding her [facility]. I attempted to reach the office but they were assisting other patients at this time. However, the patient advised me that she is throwing up everything she eats. Her call back number is [phone].    Please see notes from today

## 2023-10-08 NOTE — Telephone Encounter (Signed)
 Chief Complaint: vomiting Symptoms: nausea and vomiting after taking tirzepatide  Frequency: since Tuesday when she took tirzepatide Pertinent Negatives: Patient denies diarrhea, dizziness, weakness, abdominal pain, dark urine, bloody stools, fever Disposition: [] ED /[] Urgent Care (no appt availability in office) / [] Appointment(In office/virtual)/ []  [place] [facility]/ [] Home Care/ [] Refused Recommended Disposition /[] [place] [facility]/ [x]  Follow-up with PCP Additional Notes: Pt reports nausea and vomiting for 2 wks with taking tirzepatide. This time, pt took tirzepatide on Tuesday 4/1. Pt did not eat on Wednesday. On Thursday PM pt vomited after eating and vomited after eating today. Pt has vomited 2x in the last 24 hours. States she can keep down fluids but not food. Denies diarrhea, fever, abdominal pain, dizziness, weakness. Pt would like a prescription for Zofran, states she has taken it before for nausea and it has really helped. RN advised pt RN would relay symptoms to the office for follow-up. RN also advised pt if she develops abdominal pain, diarrhea, blood in her vomit, fever, dizziness/weakness, she needs to go to the ED. Pt verbalized understanding.   Pt states she has been trying to get in touch with Franky Macho from the [employer]. RN states Franky Macho prescribes her medications. RN unable to get in touch with that person.  Please follow-up with the pt about side effects from tirzepatide and Zofran. Next Tuesday is going to take another 12.5mg  dose.   Copied From CRM [id number].  Reason for Triage: The patient was requesting to speak to the Pharmacist [facility] regarding her [facility]. I attempted to reach the office but they were assisting other patients at this time. However, the patient advised me that she is throwing up everything she eats. Her call back number is [phone].  Reason for Disposition  MILD or MODERATE vomiting (e.g., 1 - 5 times / day)  Answer Assessment - Initial  Assessment Questions 1. VOMITING SEVERITY: "How many times have you vomited in the past 24 hours?"     - MILD:  1 - 2 times/day    - MODERATE: 3 - 5 times/day, decreased oral intake without significant weight loss or symptoms of dehydration    - SEVERE: 6 or more times/day, vomits everything or nearly everything, with significant weight loss, symptoms of dehydration      2x/vomiting 2. ONSET: "When did the vomiting begin?"      Started vomiting with Mounjaro 2 weeks ago 3. FLUIDS: "What fluids or food have you vomited up today?" "Have you been able to keep any fluids down?"     States she can keep liquids down but can't keep food down, not even soup. 4. ABDOMEN PAIN: "Are your having any abdomen pain?" If Yes : "How bad is it and what does it feel like?" (e.g., crampy, dull, intermittent, constant)      No 5. DIARRHEA: "Is there any diarrhea?" If Yes, ask: "How many times today?"      No 6. CONTACTS: "Is there anyone else in the family with the same symptoms?"      No 7. CAUSE: "What do you think is causing your vomiting?"     Mounjaro  8. HYDRATION STATUS: "Any signs of dehydration?" (e.g., dry mouth [not only dry lips], too weak to stand) "When did you last urinate?"     Endorses dry mouth, denies everything else 9. OTHER SYMPTOMS: "Do you have any other symptoms?" (e.g., fever, headache, vertigo, vomiting blood or coffee grounds, recent head injury)     No blood in her vomit. Endorses  regular Bms. Denies fever. Endorses headache - hx of migraines, does not know if it is Mounjaro or a migraine. Denies dizziness or weakness. 10. PREGNANCY: "Is there any chance you are pregnant?" "When was your last menstrual period?"        12.5mg  [facility] Tuesday 4/1. Wednesday did not eat. Ate late night - threw up. Today she ate and threw up. Had surgery 2/7, was prescribed Zofran, states that helped. Pt states she is out of Zofran, would like it prescribed to her again to help with nausea. Would like to  keep taking 12.5mg  a few more weeks before jumping to 15mg  to give her body time to adjust.  Protocols used: Vomiting-A-AH

## 2023-10-08 NOTE — Telephone Encounter (Signed)
 Call returned. Patient confirms history taken by triage earlier today. We will have her decrease Mounjaro to 7.5 mg weekly starting Tuesday (10/12/23). I have instructed her to try to rehydrate as tolerable. I also sent in refills for Zofran in for her. Lastly, we discussed symptomatology that should prompt ED/UC visit. Patient verbalizes understanding of the plan.

## 2023-10-08 NOTE — Telephone Encounter (Signed)
 Patient returning call.  Copied from CRM [id number]. Topic: Clinical - Prescription Issue >> Oct 08, 2023  4:48 PM Tiffany S wrote: Reason for CRM: Patient stated tirzepatide [facility]) 7.5 MG/0.5ML Pen [[id number]] wen t to the wrong pharmacy patient stated it suppose to go to the pharmacy down [place] pharmacy please follow up with patient

## 2023-10-09 ENCOUNTER — Other Ambulatory Visit: Payer: Self-pay | Admitting: Family Medicine

## 2023-10-11 ENCOUNTER — Other Ambulatory Visit: Payer: Self-pay | Admitting: Pharmacist

## 2023-10-11 ENCOUNTER — Other Ambulatory Visit: Payer: Self-pay

## 2023-10-11 MED ORDER — TIRZEPATIDE 7.5 MG/0.5ML ~~LOC~~ SOAJ
7.5000 mg | SUBCUTANEOUS | 0 refills | Status: DC
  Filled 2023-10-11 (×2): qty 2, 28d supply, fill #0
  Filled ????-??-??: fill #0

## 2023-10-11 NOTE — Telephone Encounter (Signed)
 Rxn sent to our pharmacy instead.

## 2023-10-12 ENCOUNTER — Ambulatory Visit: Admitting: Family Medicine

## 2023-10-18 ENCOUNTER — Encounter (HOSPITAL_COMMUNITY): Payer: Self-pay

## 2023-10-18 ENCOUNTER — Ambulatory Visit (HOSPITAL_COMMUNITY): Admission: EM | Admit: 2023-10-18 | Discharge: 2023-10-18 | Disposition: A | Discharge status: Home / Self Care

## 2023-10-18 ENCOUNTER — Other Ambulatory Visit: Payer: Self-pay

## 2023-10-18 DIAGNOSIS — M25511 Pain in right shoulder: Secondary | ICD-10-CM | POA: Diagnosis not present

## 2023-10-18 DIAGNOSIS — T148XXA Other injury of unspecified body region, initial encounter: Secondary | ICD-10-CM

## 2023-10-18 MED ORDER — CYCLOBENZAPRINE HCL 10 MG PO TABS
10.0000 mg | ORAL_TABLET | Freq: Two times a day (BID) | ORAL | 0 refills | Status: DC | PRN
  Filled 2023-10-18: qty 20, 10d supply, fill #0

## 2023-10-18 MED ORDER — IBUPROFEN 800 MG PO TABS
800.0000 mg | ORAL_TABLET | Freq: Once | ORAL | Status: AC
  Administered 2023-10-18: 800 mg via ORAL

## 2023-10-18 MED ORDER — IBUPROFEN 800 MG PO TABS
ORAL_TABLET | ORAL | Status: AC
  Filled 2023-10-18: qty 1

## 2023-10-18 MED ORDER — IBUPROFEN 800 MG PO TABS
800.0000 mg | ORAL_TABLET | Freq: Three times a day (TID) | ORAL | 0 refills | Status: DC
  Filled 2023-10-18: qty 21, 7d supply, fill #0

## 2023-10-18 NOTE — Discharge Instructions (Addendum)
 You can take the muscle relaxer Flexeril twice daily. If the medication makes you drowsy, take only at bed time. Ibuprofen can be used every 6 hours Avoid heavy lifting and strenuous activity It may take several days to a week for symptoms to improve. Unfortunately you may have worse symptoms the next 2 days.  Please go to the emergency department if symptoms are severe.
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
## 2023-10-28 ENCOUNTER — Other Ambulatory Visit: Payer: Self-pay

## 2023-11-08 ENCOUNTER — Encounter: Payer: Self-pay | Admitting: Podiatry

## 2023-11-08 ENCOUNTER — Ambulatory Visit (INDEPENDENT_AMBULATORY_CARE_PROVIDER_SITE_OTHER): Admitting: Podiatry

## 2023-11-08 DIAGNOSIS — M7661 Achilles tendinitis, right leg: Secondary | ICD-10-CM

## 2023-11-08 DIAGNOSIS — Z9889 Other specified postprocedural states: Secondary | ICD-10-CM

## 2023-11-08 DIAGNOSIS — M7662 Achilles tendinitis, left leg: Secondary | ICD-10-CM | POA: Diagnosis not present

## 2023-11-08 NOTE — Progress Notes (Signed)
 Subjective:  Patient ID: Ruth [name], female    DOB: February 18, 1976,  MRN: [id number]  Chief Complaint  Patient presents with   Routine Post Op    DOS: 08/13/23              Procedure: Left haglunds resection with secondary achilles tendon repair   "It does still bother me some when I'm walking"    DOS: 08/13/23   Procedure: Left haglunds resection with secondary achilles tendon repair    48 y.o. female returns for POV#5. Relates doing well and managing with pain. Left side well healed   Here also today to discuss plans for right foot surgery. Relates this side has been painful as well and ready to discuss treatments options and surgery for this.   Review of Systems: Negative except as noted in the HPI. Denies N/V/F/Ch.  Past Medical History:  Diagnosis Date   Abnormal uterine bleeding 2023   Anemia 2023   Anxiety    Arthritis    chronic back pain    Asthma    rarely uses inhaler   Bipolar 1 disorder (HCC)    Follows w/ PCP Dr. Abraham Abo.   Fibromyalgia    GERD (gastroesophageal reflux disease)    Headache    Hidradenitis    multiple boils, axilla and groin (As of 08/03/22, patient has a boil in her left axilla.)   HLD (hyperlipidemia)    diet controlled   Hx of migraines    occ   Hypertension    no meds   Hypothyroidism    Intentional drug overdose (HCC) 03/28/2021   Obesity    Sleep apnea    Patient states that she had a sleep study in the past and was told she did not need a CPAP.   Thrombocytosis 01/26/2020   Type 2 diabetes mellitus (HCC)    Type 2, Follows w/ PCP Dr. Abraham Abo.   Wears glasses     Current Outpatient Medications:    albuterol  (PROVENTIL ) (2.5 MG/3ML) 0.083% nebulizer solution, Take 3 mLs (2.5 mg total) by nebulization every 6 (six) hours as needed for wheezing or shortness of breath., Disp: 75 mL, Rfl: 12   albuterol  (VENTOLIN  HFA) 108 (90 Base) MCG/ACT inhaler, Inhale 2 puffs into the lungs every 4 (four) hours as needed for  wheezing or shortness of breath., Disp: 18 g, Rfl: 5   atorvastatin  (LIPITOR ) 40 MG tablet, Take 1 tablet (40 mg total) by mouth daily., Disp: 90 tablet, Rfl: 3   cetirizine  (ZYRTEC  ALLERGY) 10 MG tablet, Take 1 tablet (10 mg total) by mouth daily., Disp: 90 tablet, Rfl: 1   Continuous Glucose Sensor (DEXCOM G7 SENSOR) MISC, Use to check blood sugar three time daily. Change sensors once every 10 days., Disp: 3 each, Rfl: 2   cromolyn (OPTICROM) 4 % ophthalmic solution, Place 1 drop into both eyes in the morning and at bedtime., Disp: , Rfl:    cyclobenzaprine  (FLEXERIL ) 10 MG tablet, Take 1 tablet (10 mg total) by mouth 2 (two) times daily as needed for muscle spasms., Disp: 20 tablet, Rfl: 0   fluticasone  (FLONASE ) 50 MCG/ACT nasal spray, Place 1 spray into both nostrils daily., Disp: 9.9 mL, Rfl: 2   fluticasone  (FLOVENT  HFA) 110 MCG/ACT inhaler, Inhale 2 puffs into the lungs 2 (two) times daily., Disp: 1 each, Rfl: 5   hydrOXYzine  (ATARAX ) 25 MG tablet, Take 1 tablet (25 mg total) by mouth 3 (three) times daily as needed. (Patient not taking: Reported  on 09/13/2023), Disp: 30 tablet, Rfl: 0   ibuprofen  (ADVIL ) 800 MG tablet, Take 1 tablet (800 mg total) by mouth 3 (three) times daily., Disp: 21 tablet, Rfl: 0   montelukast  (SINGULAIR ) 10 MG tablet, Take 1 tablet (10 mg total) by mouth at bedtime., Disp: 90 tablet, Rfl: 1   Multiple Vitamin (MULTIVITAMIN) capsule, Take 2 capsules by mouth daily., Disp: , Rfl:    OLANZapine  (ZYPREXA ) 5 MG tablet, Take 1 tablet (5 mg total) by mouth at bedtime., Disp: 90 tablet, Rfl: 0   omeprazole  (PRILOSEC) 40 MG capsule, Take 1 capsule (40 mg total) by mouth daily., Disp: 90 capsule, Rfl: 1   ondansetron  (ZOFRAN ) 4 MG tablet, Take 1 tablet (4 mg total) by mouth every 8 (eight) hours as needed for nausea or vomiting., Disp: 20 tablet, Rfl: 0   sertraline  (ZOLOFT ) 50 MG tablet, Take 1 tablet (50 mg total) by mouth daily., Disp: 90 tablet, Rfl: 0   tirzepatide   (MOUNJARO ) 7.5 MG/0.5ML Pen, Inject 7.5 mg into the skin once a week., Disp: 2 mL, Rfl: 0   XIIDRA 5 % SOLN, Apply 1 drop to eye 2 (two) times daily., Disp: , Rfl:   Social History   Tobacco Use  Smoking Status Every Day   Current packs/day: 0.25   Types: Cigarettes  Smokeless Tobacco Never  Tobacco Comments   Per patient, she only smokes 2 to 3 cigarettes daily as of 08/03/22.    Allergies  Allergen Reactions   Metformin  And Related Diarrhea   Penicillins Other (See Comments)    Unknown; from childhood Has patient had a PCN reaction causing immediate rash, facial/tongue/throat swelling, SOB or lightheadedness with hypotension: Unknow Has patient had a PCN reaction causing severe rash involving mucus membranes or skin necrosis: Unknown Has patient had a PCN reaction that required hospitalization: Unknown Has patient had a PCN reaction occurring within the last 10 years: Unknown If all of the above answers are "NO", then may proceed with Cephalosporin use.    Victoza [Liraglutide] Itching   Objective:  There were no vitals filed for this visit. There is no height or weight on file to calculate BMI. Constitutional Well developed. Well nourished.  Vascular Foot warm and well perfused. Capillary refill normal to all digits.   Neurologic Normal speech. Oriented to person, place, and time. Epicritic sensation to light touch grossly present bilaterally.  Dermatologic Skin healing well without signs of infection. Skin edges well coapted without signs of infection.  Orthopedic: Tenderness to palpation noted about the surgical site. Tender on the right to insertion osite of tendon and pain with ROM of the ankle. Palpable spurring noted in area of pain.      IMPRESSION: 1. Moderate chronic ossified enthesopathic spur posterior distal to the Achilles tendon insertion with near fluid bright marrow edema within this enthesophyte. There is mild distal Achilles insertional tendinosis.  Punctate 1 mm possible interstitial tear at the far distal medial Achilles tendon insertion. No tendon retraction. These findings likely reflect acute on chronic Achilles tendinosis. 2. Moderate-to-large plantar calcaneal heel spur. The plantar fascia origin is intact. 3. Mild cartilage thinning and subchondral marrow edema within the anterior aspect of the middle subtalar joint.    Radiographs: Interval resection of spurring and haglunds deformity posterior calcaneus  Assessment:   1. Status post surgery   2. Achilles tendonitis, bilateral     Plan:  Patient was evaluated and treated and all questions answered.  S/p foot surgery left -Progressing as expected post-operatively. -  WB Status: May begin WBAT in regular shoes.  -Medications: n/a Discharged from surgical standpoint on left   -Xrays reviewed and MRI reviewed for right foot  -Discussed Achilles insertional tendonitis and treatment options with patient.  -Continue stretching and anti-inflammatories  -Patient has exhausted conservative treatment on right and has healed on left and now ready to discuss surgery for this side.   -Discussed perioperative course in detail and plan for right secondary achilles repeari with resection of haglunds deformity.  -Informed surgical risk consent was reviewed and read aloud to the patient.  I reviewed the films.  I have discussed my findings with the patient in great detail.  I have discussed all risks including but not limited to infection, stiffness, scarring, limp, disability, deformity, damage to blood vessels and nerves, numbness, poor healing, need for braces, arthritis, chronic pain, amputation, death.  All benefits and realistic expectations discussed in great detail.  I have made no promises as to the outcome.  I have provided realistic expectations.  I have offered the patient a 2nd opinion, which they have declined and assured me they preferred to proceed despite the risks. Will plan  for surgery end of May or early June  at hosptial.  Post-op: Keflex , ASA BID, Oxyc 5/325, zofran .    Return if symptoms worsen or fail to improve.

## 2023-11-09 ENCOUNTER — Other Ambulatory Visit: Payer: Self-pay | Admitting: Family Medicine

## 2023-11-09 ENCOUNTER — Other Ambulatory Visit: Payer: Self-pay

## 2023-11-09 MED ORDER — MOUNJARO 7.5 MG/0.5ML ~~LOC~~ SOAJ
7.5000 mg | SUBCUTANEOUS | 0 refills | Status: DC
  Filled 2023-11-09: qty 2, 28d supply, fill #0

## 2023-11-10 ENCOUNTER — Other Ambulatory Visit: Payer: Self-pay

## 2023-11-10 ENCOUNTER — Telehealth: Payer: Self-pay | Admitting: Family Medicine

## 2023-11-10 DIAGNOSIS — E1169 Type 2 diabetes mellitus with other specified complication: Secondary | ICD-10-CM

## 2023-11-10 NOTE — Telephone Encounter (Signed)
 Copied from CRM [id number]. Topic: Clinical - Prescription Issue >> Nov 10, 2023  2:22 PM Star [place] wrote:  Reason for CRM: tirzepatide  (MOUNJARO ) 7.5 MG/0.5ML Pen still not filled and Dexcom needs prior auth- missed does of Mounjaro - would like to go back up to 10 mg- please call [phone]  >> Nov 10, 2023  5:03 PM Ethelle Herb L wrote: Patient requesting to speak to [facility] in regards to mounjaro  and dexcom.   Patient requesting call back as soon as possible, [phone]

## 2023-11-11 ENCOUNTER — Other Ambulatory Visit: Payer: Self-pay

## 2023-11-11 MED ORDER — DEXCOM G7 SENSOR MISC
2 refills | Status: DC
Start: 2023-11-11 — End: 2024-03-02
  Filled 2023-11-11: qty 3, 30d supply, fill #0
  Filled 2023-12-09: qty 3, 30d supply, fill #1
  Filled 2024-01-13: qty 3, 30d supply, fill #2

## 2023-11-11 MED ORDER — TIRZEPATIDE 10 MG/0.5ML ~~LOC~~ SOAJ
10.0000 mg | SUBCUTANEOUS | 1 refills | Status: DC
Start: 2023-11-11 — End: 2024-05-02
  Filled 2023-11-11: qty 2, 28d supply, fill #0
  Filled 2023-12-04: qty 2, 28d supply, fill #1
  Filled 2024-01-04: qty 2, 28d supply, fill #2
  Filled 2024-02-09 (×2): qty 2, 28d supply, fill #3
  Filled 2024-03-28: qty 2, 28d supply, fill #4
  Filled 2024-05-01: qty 2, 28d supply, fill #5

## 2023-11-11 NOTE — Addendum Note (Signed)
 Addended by: Freada Jacobs, Mara Seminole L on: 11/11/2023 12:55 PM   Modules accepted: Orders

## 2023-11-11 NOTE — Telephone Encounter (Signed)
 Hey friend,   I sent in Mounjaro  10 mg weekly for this patient per request. She had side effects to the 12.5 and higher doses but not with 10mg .   Additionally, I sent a refill for Dexcom but she is telling me she needs another PA approved to fill. Can we process a PA for Dexcom for her?

## 2023-11-12 ENCOUNTER — Encounter (HOSPITAL_COMMUNITY): Payer: Self-pay

## 2023-11-12 ENCOUNTER — Ambulatory Visit (HOSPITAL_COMMUNITY)
Admission: EM | Admit: 2023-11-12 | Discharge: 2023-11-12 | Disposition: A | Discharge status: Home / Self Care | Attending: Family Medicine | Admitting: Family Medicine

## 2023-11-12 ENCOUNTER — Other Ambulatory Visit: Payer: Self-pay

## 2023-11-12 DIAGNOSIS — M7918 Myalgia, other site: Secondary | ICD-10-CM | POA: Insufficient documentation

## 2023-11-12 DIAGNOSIS — R21 Rash and other nonspecific skin eruption: Secondary | ICD-10-CM | POA: Insufficient documentation

## 2023-11-12 LAB — COMPREHENSIVE METABOLIC PANEL WITH GFR
ALT: 15 U/L (ref 0–44)
AST: 15 U/L (ref 15–41)
Albumin: 3.4 g/dL — ABNORMAL LOW (ref 3.5–5.0)
Alkaline Phosphatase: 59 U/L (ref 38–126)
Anion gap: 12 (ref 5–15)
BUN: 8 mg/dL (ref 6–20)
CO2: 22 mmol/L (ref 22–32)
Calcium: 8.7 mg/dL — ABNORMAL LOW (ref 8.9–10.3)
Chloride: 103 mmol/L (ref 98–111)
Creatinine, Ser: 0.7 mg/dL (ref 0.44–1.00)
GFR, Estimated: >60 mL/min (ref >60)
Glucose, Bld: 136 mg/dL — ABNORMAL HIGH (ref 70–99)
Potassium: 3.4 mmol/L — ABNORMAL LOW (ref 3.5–5.1)
Sodium: 137 mmol/L (ref 135–145)
Total Bilirubin: 0.4 mg/dL (ref 0.0–1.2)
Total Protein: 6.9 g/dL (ref 6.5–8.1)

## 2023-11-12 LAB — CBC WITH DIFFERENTIAL/PLATELET
Abs Immature Granulocytes: 0.01 10*3/uL (ref 0.00–0.07)
Basophils Absolute: 0 10*3/uL (ref 0.0–0.1)
Basophils Relative: 0 %
Eosinophils Absolute: 0.1 10*3/uL (ref 0.0–0.5)
Eosinophils Relative: 4 %
HCT: 32.4 % — ABNORMAL LOW (ref 36.0–46.0)
Hemoglobin: 10.9 g/dL — ABNORMAL LOW (ref 12.0–15.0)
Immature Granulocytes: 0 %
Lymphocytes Relative: 25 %
Lymphs Abs: 0.9 10*3/uL (ref 0.7–4.0)
MCH: 27.8 pg (ref 26.0–34.0)
MCHC: 33.6 g/dL (ref 30.0–36.0)
MCV: 82.7 fL (ref 80.0–100.0)
Monocytes Absolute: 0.2 10*3/uL (ref 0.1–1.0)
Monocytes Relative: 5 %
Neutro Abs: 2.4 10*3/uL (ref 1.7–7.7)
Neutrophils Relative %: 66 %
Platelets: 345 10*3/uL (ref 150–400)
RBC: 3.92 MIL/uL (ref 3.87–5.11)
RDW: 13.3 % (ref 11.5–15.5)
WBC: 3.6 10*3/uL — ABNORMAL LOW (ref 4.0–10.5)
nRBC: 0 % (ref 0.0–0.2)

## 2023-11-12 LAB — POC COVID19/FLU A&B COMBO
Covid Antigen, POC: NEGATIVE
Influenza A Antigen, POC: NEGATIVE
Influenza B Antigen, POC: NEGATIVE

## 2023-11-12 LAB — SEDIMENTATION RATE: Sed Rate: 37 mm/h — ABNORMAL HIGH (ref 0–22)

## 2023-11-12 MED ORDER — KETOROLAC TROMETHAMINE 30 MG/ML IJ SOLN
INTRAMUSCULAR | Status: AC
  Filled 2023-11-12: qty 1

## 2023-11-12 MED ORDER — KETOROLAC TROMETHAMINE 30 MG/ML IJ SOLN
30.0000 mg | Freq: Once | INTRAMUSCULAR | Status: AC
  Administered 2023-11-12: 30 mg via INTRAMUSCULAR

## 2023-11-12 MED ORDER — PREDNISONE 20 MG PO TABS
40.0000 mg | ORAL_TABLET | Freq: Every day | ORAL | 0 refills | Status: AC
  Filled 2023-11-12: qty 10, 5d supply, fill #0

## 2023-11-12 NOTE — ED Provider Notes (Signed)
 [facility]    CSN: [id number] Arrival date & time: 11/12/23  1116      History   Chief Complaint Chief Complaint  Patient presents with   Generalized Body Aches   Rash    HPI Ruth [name] is a 48 y.o. female.    Rash Associated symptoms: joint pain, myalgias and sore throat    Pain is here for "pain all over" x 3 days.  In her joints, muscles, etc.   She took a muscle relaxer and epson salt bath.  She continues to be in pain all over.   She also notes that her whole body is "red" with a  rash.  This is not itchy, her skin just looks different.  Pain is mostly at the neck, back, arms.  Her legs are not as painful as they were.  She has chills, no fevers.  No n/v.  No insect or bug bites.  No one else is sick.  She has a mild sore throat, no other URI symptoms.  No h/o rheumatological issues.  No new medications or changes.         Past Medical History:  Diagnosis Date   Abnormal uterine bleeding 2023   Anemia 2023   Anxiety    Arthritis    chronic back pain    Asthma    rarely uses inhaler   Bipolar 1 disorder (HCC)    Follows w/ PCP Dr. Abraham Abo.   Fibromyalgia    GERD (gastroesophageal reflux disease)    Headache    Hidradenitis    multiple boils, axilla and groin (As of 08/03/22, patient has a boil in her left axilla.)   HLD (hyperlipidemia)    diet controlled   Hx of migraines    occ   Hypertension    no meds   Hypothyroidism    Intentional drug overdose (HCC) 03/28/2021   Obesity    Sleep apnea    Patient states that she had a sleep study in the past and was told she did not need a CPAP.   Thrombocytosis 01/26/2020   Type 2 diabetes mellitus (HCC)    Type 2, Follows w/ PCP Dr. Abraham Abo.   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   BMI 45.0-49.9, adult (HCC) 09/04/2020   Right arm pain 02/14/2020   Hyperlipidemia  associated with type 2 diabetes mellitus (HCC) 09/19/2019   Abnormal uterine bleeding (AUB) 12/07/2017   Abnormal mammogram of both breasts 11/02/2016   Hypothyroidism 06/21/2009   Type 2 diabetes mellitus with peripheral neuropathy (HCC) 06/21/2009   BIPOLAR AFFECTIVE DISORDER 06/21/2009   PERSISTENT DISORDER INITIATING/MAINTAINING SLEEP 06/21/2009   INADEQUATE SLEEP HYGIENE 06/21/2009   OBSTRUCTIVE SLEEP APNEA 06/21/2009   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   CARPAL TUNNEL SYNDROME 06/20/2009   MERALGIA PARESTHETICA 06/20/2009   Essential hypertension 06/20/2009   FIBROMYALGIA 06/20/2009   Headache 06/20/2009    Past Surgical History:  Procedure Laterality Date   ACHILLES TENDON SURGERY Left 08/13/2023   Procedure: ACHILLES TENDON REPAIR;  Surgeon: Jennefer Moats, DPM;  Location: [facility];  Service: Orthopedics/Podiatry;  Laterality: Left;   ANTERIOR CERVICAL DECOMP/DISCECTOMY FUSION N/A 01/16/2021   Procedure: Cervical five-six Anterior cervical decompression/discectomy/fusion;  Surgeon: Augusto Blonder, MD;  Location: [facility];  Service: Neurosurgery;  Laterality: N/A;   CARPAL TUNNEL RELEASE Right    around 2009   CESAREAN SECTION  x2, 2003 & 2008   CYSTOSCOPY N/A 08/12/2022   Procedure: CYSTOSCOPY;  Surgeon: Kiki Pelton, MD;  Location: [facility];  Service: Gynecology;  Laterality: N/A;   DILITATION & CURRETTAGE/HYSTROSCOPY WITH NOVASURE ABLATION N/A 12/07/2017   Procedure: DILATATION & CURETTAGE/HYSTEROSCOPY WITH ATTEMPED NOVASURE ABLATION, FAILED/ENDOMETERIAL ABLATION WITH HTA SYSTEM;  Surgeon: Lenord Radon, MD;  Location: [facility];  Service: Gynecology;  Laterality: N/A;   ELBOW SURGERY Left    "surgery for nerve pain" around 2009   ENDOMETRIAL BIOPSY  07/23/2022   negative for hyperplasia or malignancy   GANGLION CYST EXCISION Right    right arm    OSTECTOMY Left 08/13/2023   Procedure: CALCANEAL OSTECTOMY;  Surgeon: Jennefer Moats, DPM;   Location: [facility];  Service: Orthopedics/Podiatry;  Laterality: Left;  POPLITEAL/SAPHENOUS BLOCK   RECTOVAGINAL FISTULA CLOSURE     in [id number], 2006   ROBOTIC ASSISTED TOTAL HYSTERECTOMY Bilateral 08/12/2022   Procedure: XI ROBOTIC ASSISTED TOTAL HYSTERECTOMY WITH SALPINGECTOMY;  Surgeon: Kiki Pelton, MD;  Location: [facility];  Service: Gynecology;  Laterality: Bilateral;   TONSILLECTOMY     childhood   TUBAL LIGATION  2008   XI ROBOT ASSISTED RECTOPEXY N/A 08/12/2022   Procedure: XI robot repair serosal repair of rectum;  Surgeon: Candyce Champagne, MD;  Location: [facility] [place];  Service: General;  Laterality: N/A;    OB History     Gravida  7   Para  5   Term  4   Preterm  1   AB  2   Living  5      SAB  2   IAB      Ectopic      Multiple      Live Births  5        Obstetric Comments  Vag x 2>c/s emergency>vbac>c/s and BTL 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 Authorizing Provider  albuterol  (PROVENTIL ) (2.5 MG/3ML) 0.083% nebulizer solution Take 3 mLs (2.5 mg total) by nebulization every 6 (six) hours as needed for wheezing or shortness of breath. 09/13/23  Yes Abraham Abo, MD  albuterol  (VENTOLIN  HFA) 108 [phone] Base) MCG/ACT inhaler Inhale 2 puffs into the lungs every 4 (four) hours as needed for wheezing or shortness of breath. 09/13/23  Yes Abraham Abo, MD  atorvastatin  (LIPITOR ) 40 MG tablet Take 1 tablet (40 mg total) by mouth daily. 09/27/23  Yes Newlin, Enobong, MD  cetirizine  (ZYRTEC  ALLERGY) 10 MG tablet Take 1 tablet (10 mg total) by mouth daily. 09/13/23 12/12/23 Yes Abraham Abo, MD  cromolyn (OPTICROM) 4 % ophthalmic solution Place 1 drop into both eyes in the morning and at bedtime. 09/17/23  Yes [provider]  cyclobenzaprine  (FLEXERIL ) 10 MG tablet Take 1 tablet (10 mg total) by  mouth 2 (two) times daily as needed for muscle spasms. 10/18/23  Yes Rising, Ivette Marks, PA-C  fluticasone  (FLONASE ) 50 MCG/ACT nasal spray Place 1 spray into both nostrils daily. 09/13/23  Yes Abraham Abo, MD  fluticasone  (FLOVENT  HFA) 110 MCG/ACT inhaler Inhale 2 puffs into the lungs 2 (two) times daily. 09/13/23  Yes Abraham Abo, MD  hydrOXYzine  (ATARAX ) 25 MG tablet Take 1 tablet (25 mg total) by mouth 3 (three) times daily as needed. 02/01/23  Yes Abraham Abo, MD  ibuprofen  (ADVIL ) 800 MG tablet Take 1 tablet (  800 mg total) by mouth 3 (three) times daily. 10/18/23  Yes Rising, Ivette Marks, PA-C  montelukast  (SINGULAIR ) 10 MG tablet Take 1 tablet (10 mg total) by mouth at bedtime. 09/13/23  Yes Abraham Abo, MD  Multiple Vitamin (MULTIVITAMIN) capsule Take 2 capsules by mouth daily.   Yes [provider]  OLANZapine  (ZYPREXA ) 5 MG tablet Take 1 tablet (5 mg total) by mouth at bedtime. 02/01/23  Yes Abraham Abo, MD  omeprazole  (PRILOSEC) 40 MG capsule Take 1 capsule (40 mg total) by mouth daily. 09/27/23  Yes Newlin, Enobong, MD  ondansetron  (ZOFRAN ) 4 MG tablet Take 1 tablet (4 mg total) by mouth every 8 (eight) hours as needed for nausea or vomiting. 10/08/23  Yes Newlin, Enobong, MD  sertraline  (ZOLOFT ) 50 MG tablet Take 1 tablet (50 mg total) by mouth daily. 09/13/23  Yes Abraham Abo, MD  tirzepatide  (MOUNJARO ) 10 MG/0.5ML Pen Inject 10 mg into the skin once a week. 11/11/23  Yes Newlin, Lavelle Posey, MD  XIIDRA 5 % SOLN Apply 1 drop to eye 2 (two) times daily. 09/17/23  Yes [provider]  Continuous Glucose Sensor (DEXCOM G7 SENSOR) MISC Use to check blood sugar three time daily. Change sensors once every 10 days. 11/11/23 02/13/24  Joaquin Mulberry, MD    Family History Family History  Problem Relation Age of Onset   Thyroid  disease Mother    Diabetes Daughter    Diabetes Son    Breast cancer Neg Hx     Social History Social History   Tobacco Use   Smoking status: Every  Day    Current packs/day: 0.25    Types: Cigarettes   Smokeless tobacco: Never   Tobacco comments:    Per patient, she only smokes 2 to 3 cigarettes daily as of 08/03/22.  Vaping Use   Vaping status: Never Used  Substance Use Topics   Alcohol use: Not Currently   Drug use: Yes    Types: Marijuana    Comment: Occasional use only     Allergies   Metformin  and related, Penicillins, and Victoza [liraglutide]   Review of Systems Review of Systems  Constitutional: Negative.   HENT:  Positive for sore throat.   Respiratory: Negative.    Cardiovascular: Negative.   Gastrointestinal: Negative.   Genitourinary: Negative.   Musculoskeletal:  Positive for arthralgias and myalgias.  Skin:  Positive for color change.  Psychiatric/Behavioral: Negative.       Physical Exam Triage Vital Signs ED Triage Vitals  Encounter Vitals Group     BP 11/12/23 1148 115/75     Systolic BP Percentile --      Diastolic BP Percentile --      Pulse Rate 11/12/23 1148 87     Resp 11/12/23 1148 20     Temp 11/12/23 1148 98.2 F (36.8 C)     Temp Source 11/12/23 1148 Oral     SpO2 11/12/23 1148 100 %     Weight --      Height --      Head Circumference --      Peak Flow --      Pain Score 11/12/23 1146 10     Pain Loc --      Pain Education --      Exclude from Growth Chart --    No data found.  Updated Vital Signs BP 115/75 (BP Location: Right Arm)   Pulse 87   Temp 98.2 F (36.8 C) (Oral)   Resp 20  LMP 07/05/2022   SpO2 100%   Visual Acuity Right Eye Distance:   Left Eye Distance:   Bilateral Distance:    Right Eye Near:   Left Eye Near:    Bilateral Near:     Physical Exam Constitutional:      Appearance: Normal appearance. She is normal weight.  Cardiovascular:     Rate and Rhythm: Normal rate and regular rhythm.  Pulmonary:     Effort: Pulmonary effort is normal.     Breath sounds: Normal breath sounds.  Musculoskeletal:     Comments: TTP to the cervical spine to  the mid back;  very tender to the trapezius bilaterally/paraspinally;  very TTP to the upper chest wall;  TTP to the arms and hands She is able to raise the arms mid way, but not above her head;  hand grip strength is equal but decreased due to pain  Skin:    General: Skin is warm.     Comments: Her cheeks are flushed;  slight red hue to the arms bilaterally; no obvious rash noted  Neurological:     General: No focal deficit present.     Mental Status: She is alert.  Psychiatric:        Mood and Affect: Mood normal.      UC Treatments / Results  Labs (all labs ordered are listed, but only abnormal results are displayed) Labs Reviewed  POC COVID19/FLU A&B COMBO - Normal  CBC WITH DIFFERENTIAL/PLATELET  COMPREHENSIVE METABOLIC PANEL WITH GFR  SEDIMENTATION RATE    EKG   Radiology No results found.  Procedures Procedures (including critical care time)  Medications Ordered in UC Medications  ketorolac  (TORADOL ) 30 MG/ML injection 30 mg (30 mg Intramuscular Given 11/12/23 1213)    Initial Impression / Assessment and Plan / UC Course  I have reviewed the triage vital signs and the nursing notes.  Pertinent labs & imaging results that were available during my care of the patient were reviewed by me and considered in my medical decision making (see chart for details).   Final Clinical Impressions(s) / UC Diagnoses   Final diagnoses:  Myalgia, multiple sites  Rash     Discharge Instructions      You were seen today for body aches and rash.  Your flu/covid swab were negative today.  As discussed, I'm not sure what is causing your symptoms.  I have ordered blood work today.  This will be resulted over the weekend, and you will get a call if anything is abnormal/concerning.   You will see these results on mychart and you may call if you have any questions.  In the meantime I have sent out oral prednisone  to see if helpful for your symptoms.  If this is not helpful, if you  have worsening pain, rash, or develop fevers, chills, nausea or vomiting then please go to the ER for further evaluation.  You may use tylenol  or motrin  for pain as well.   ED Prescriptions     Medication Sig Dispense Auth. Provider   predniSONE  (DELTASONE ) 20 MG tablet Take 2 tablets (40 mg total) by mouth daily. 10 tablet Lesle Ras, MD      PDMP not reviewed this encounter.   Lesle Ras, MD 11/12/23 1234

## 2023-11-12 NOTE — ED Triage Notes (Signed)
 Chief Complaint: body aches and rash all over the body. Having pain in the hands, arms, and chest causing stiffness and reduced ROM.   Sick exposure: No  Onset: This past Wednesday night.   Prescriptions or OTC medications tried: Yes- Muscle relaxer, Epson salt bath   with no relief  New foods, medications, or products: No  Recent Travel: No

## 2023-11-12 NOTE — Discharge Instructions (Signed)
 You were seen today for body aches and rash.  Your flu/covid swab were negative today.  As discussed, I'm not sure what is causing your symptoms.  I have ordered blood work today.  This will be resulted over the weekend, and you will get a call if anything is abnormal/concerning.   You will see these results on mychart and you may call if you have any questions.  In the meantime I have sent out oral prednisone  to see if helpful for your symptoms.  If this is not helpful, if you have worsening pain, rash, or develop fevers, chills, nausea or vomiting then please go to the ER for further evaluation.  You may use tylenol  or motrin  for pain as well.

## 2023-11-16 ENCOUNTER — Ambulatory Visit: Payer: Self-pay

## 2023-11-19 ENCOUNTER — Ambulatory Visit: Admitting: Family Medicine

## 2023-11-19 ENCOUNTER — Encounter: Payer: Self-pay | Admitting: Family Medicine

## 2023-11-19 ENCOUNTER — Other Ambulatory Visit: Payer: Self-pay

## 2023-11-19 VITALS — BP 141/77 | HR 92 | Temp 97.6°F | Resp 18 | Ht 67.0 in | Wt 248.8 lb

## 2023-11-19 DIAGNOSIS — Z01818 Encounter for other preprocedural examination: Secondary | ICD-10-CM

## 2023-11-19 DIAGNOSIS — Z0289 Encounter for other administrative examinations: Secondary | ICD-10-CM

## 2023-11-19 MED ORDER — IRON (FERROUS SULFATE) 325 (65 FE) MG PO TABS
325.0000 mg | ORAL_TABLET | Freq: Every day | ORAL | 3 refills | Status: DC
  Filled 2023-11-19: qty 30, 30d supply, fill #0
  Filled 2023-12-09 – 2023-12-24 (×3): qty 30, 30d supply, fill #1
  Filled 2024-01-22 (×2): qty 30, 30d supply, fill #2
  Filled 2024-02-21: qty 30, 30d supply, fill #3

## 2023-11-19 NOTE — Progress Notes (Signed)
 Established Patient Office Visit  Subjective    Patient ID: Ruth [name], female    DOB: 1976-02-03  Age: 47 y.o. MRN: [id number]  CC:  Chief Complaint  Patient presents with   pain all over    Lab work in chart for what is going on, form filled out    HPI [employer] presents for completion of form for preop for right achilles repair.   Outpatient Encounter Medications as of 11/19/2023  Medication Sig   albuterol  (PROVENTIL ) (2.5 MG/3ML) 0.083% nebulizer solution Take 3 mLs (2.5 mg total) by nebulization every 6 (six) hours as needed for wheezing or shortness of breath.   albuterol  (VENTOLIN  HFA) 108 (90 Base) MCG/ACT inhaler Inhale 2 puffs into the lungs every 4 (four) hours as needed for wheezing or shortness of breath.   atorvastatin  (LIPITOR ) 40 MG tablet Take 1 tablet (40 mg total) by mouth daily.   cetirizine  (ZYRTEC  ALLERGY) 10 MG tablet Take 1 tablet (10 mg total) by mouth daily.   Continuous Glucose Sensor (DEXCOM G7 SENSOR) MISC Use to check blood sugar three time daily. Change sensors once every 10 days.   cromolyn (OPTICROM) 4 % ophthalmic solution Place 1 drop into both eyes in the morning and at bedtime.   cyclobenzaprine  (FLEXERIL ) 10 MG tablet Take 1 tablet (10 mg total) by mouth 2 (two) times daily as needed for muscle spasms.   fluticasone  (FLONASE ) 50 MCG/ACT nasal spray Place 1 spray into both nostrils daily.   fluticasone  (FLOVENT  HFA) 110 MCG/ACT inhaler Inhale 2 puffs into the lungs 2 (two) times daily.   hydrOXYzine  (ATARAX ) 25 MG tablet Take 1 tablet (25 mg total) by mouth 3 (three) times daily as needed.   ibuprofen  (ADVIL ) 800 MG tablet Take 1 tablet (800 mg total) by mouth 3 (three) times daily.   montelukast  (SINGULAIR ) 10 MG tablet Take 1 tablet (10 mg total) by mouth at bedtime.   Multiple Vitamin (MULTIVITAMIN) capsule Take 2 capsules by mouth daily.   OLANZapine  (ZYPREXA ) 5 MG tablet Take 1 tablet (5 mg total) by mouth at  bedtime.   omeprazole  (PRILOSEC) 40 MG capsule Take 1 capsule (40 mg total) by mouth daily.   ondansetron  (ZOFRAN ) 4 MG tablet Take 1 tablet (4 mg total) by mouth every 8 (eight) hours as needed for nausea or vomiting.   sertraline  (ZOLOFT ) 50 MG tablet Take 1 tablet (50 mg total) by mouth daily.   tirzepatide  (MOUNJARO ) 10 MG/0.5ML Pen Inject 10 mg into the skin once a week.   XIIDRA 5 % SOLN Apply 1 drop to eye 2 (two) times daily.   No facility-administered encounter medications on file as of 11/19/2023.    Past Medical History:  Diagnosis Date   Abnormal uterine bleeding 2023   Anemia 2023   Anxiety    Arthritis    chronic back pain    Asthma    rarely uses inhaler   Bipolar 1 disorder (HCC)    Follows w/ PCP Dr. Abraham Abo.   Fibromyalgia    GERD (gastroesophageal reflux disease)    Headache    Hidradenitis    multiple boils, axilla and groin (As of 08/03/22, patient has a boil in her left axilla.)   HLD (hyperlipidemia)    diet controlled   Hx of migraines    occ   Hypertension    no meds   Hypothyroidism    Intentional drug overdose (HCC) 03/28/2021   Obesity    Sleep apnea  Patient states that she had a sleep study in the past and was told she did not need a CPAP.   Thrombocytosis 01/26/2020   Type 2 diabetes mellitus (HCC)    Type 2, Follows w/ PCP Dr. Abraham Abo.   Wears glasses     Past Surgical History:  Procedure Laterality Date   ACHILLES TENDON SURGERY Left 08/13/2023   Procedure: ACHILLES TENDON REPAIR;  Surgeon: Jennefer Moats, DPM;  Location: [facility];  Service: Orthopedics/Podiatry;  Laterality: Left;   ANTERIOR CERVICAL DECOMP/DISCECTOMY FUSION N/A 01/16/2021   Procedure: Cervical five-six Anterior cervical decompression/discectomy/fusion;  Surgeon: Augusto Blonder, MD;  [facility];  Service: Neurosurgery;  Laterality: N/A;   CARPAL TUNNEL RELEASE Right    around 2009   CESAREAN SECTION     x2, 2003 & 2008   CYSTOSCOPY N/A 08/12/2022    Procedure: CYSTOSCOPY;  Surgeon: Kiki Pelton, MD;  Location: [facility];  Service: Gynecology;  Laterality: N/A;   DILITATION & CURRETTAGE/HYSTROSCOPY WITH NOVASURE ABLATION N/A 12/07/2017   Procedure: DILATATION & CURETTAGE/HYSTEROSCOPY WITH ATTEMPED NOVASURE ABLATION, FAILED/ENDOMETERIAL ABLATION WITH HTA SYSTEM;  Surgeon: Lenord Radon, MD;  Location: [facility];  Service: Gynecology;  Laterality: N/A;   ELBOW SURGERY Left    "surgery for nerve pain" around 2009   ENDOMETRIAL BIOPSY  07/23/2022   negative for hyperplasia or malignancy   GANGLION CYST EXCISION Right    right arm    OSTECTOMY Left 08/13/2023   Procedure: CALCANEAL OSTECTOMY;  Surgeon: Jennefer Moats, DPM;  Location: [facility];  Service: Orthopedics/Podiatry;  Laterality: Left;  POPLITEAL/SAPHENOUS BLOCK   RECTOVAGINAL FISTULA CLOSURE     in [id number], 2006   ROBOTIC ASSISTED TOTAL HYSTERECTOMY Bilateral 08/12/2022   Procedure: XI ROBOTIC ASSISTED TOTAL HYSTERECTOMY WITH SALPINGECTOMY;  Surgeon: Kiki Pelton, MD;  Location: [facility];  Service: Gynecology;  Laterality: Bilateral;   TONSILLECTOMY     childhood   TUBAL LIGATION  2008   XI ROBOT ASSISTED RECTOPEXY N/A 08/12/2022   Procedure: XI robot repair serosal repair of rectum;  Surgeon: Candyce Champagne, MD;  Location: [facility];  Service: General;  Laterality: N/A;    Family History  Problem Relation Age of Onset   Thyroid  disease Mother    Diabetes Daughter    Diabetes Son    Breast cancer Neg Hx     Social History   Socioeconomic History   Marital status: Married    Spouse name: Not on file   Number of children: Not on file   Years of education: Not on file   Highest education level: GED or equivalent  Occupational History   Not on file  Tobacco Use   Smoking status: Every Day    Current packs/day: 0.25    Types: Cigarettes   Smokeless tobacco: Never   Tobacco comments:    Per  patient, she only smokes 2 to 3 cigarettes daily as of 08/03/22.  Vaping Use   Vaping status: Never Used  Substance and Sexual Activity   Alcohol use: Not Currently   Drug use: Yes    Types: Marijuana    Comment: Occasional use only   Sexual activity: Yes    Birth control/protection: Surgical    Comment: tubal ligation  Other Topics Concern   Not on file  Social History Narrative   Not on file   Social Drivers of Health   Financial Resource Strain: Medium Risk (08/09/2023)   Overall Financial Resource Strain (CARDIA)    Difficulty  of Paying Living Expenses: Somewhat hard  Food Insecurity: Food Insecurity Present (08/09/2023)   Hunger Vital Sign    Worried About Running Out of Food in the Last Year: Never true    Ran Out of Food in the Last Year: Sometimes true  Transportation Needs: No Transportation Needs (08/09/2023)   PRAPARE - Administrator, Civil Service (Medical): No    Lack of Transportation (Non-Medical): No  Physical Activity: Insufficiently Active (08/09/2023)   Exercise Vital Sign    Days of Exercise per Week: 3 days    Minutes of Exercise per Session: 30 min  Stress: No Stress Concern Present (08/09/2023)   [employer] of Occupational Health - Occupational Stress Questionnaire    Feeling of Stress : Only a little  Social Connections: Moderately Isolated (08/09/2023)   Social Connection and Isolation Panel [NHANES]    Frequency of Communication with Friends and Family: More than three times a week    Frequency of Social Gatherings with Friends and Family: Twice a week    Attends Religious Services: Never    Database administrator or Organizations: No    Attends Engineer, structural: Not on file    Marital Status: Married  Catering manager Violence: Not At Risk (05/14/2023)   Humiliation, Afraid, Rape, and Kick questionnaire    Fear of Current or Ex-Partner: No    Emotionally Abused: No    Physically Abused: No    Sexually Abused: No     Review of Systems  Musculoskeletal:  Positive for joint pain.  All other systems reviewed and are negative.       Objective    BP (!) 141/77   Pulse 92   Temp 97.6 F (36.4 C) (Oral)   Resp 18   Ht 5\' 7"  (1.702 m)   Wt 248 lb 12.8 oz (112.9 kg)   LMP 07/05/2022   SpO2 95%   BMI 38.97 kg/m   Physical Exam Vitals and nursing note reviewed.  Constitutional:      General: She is not in acute distress.    Appearance: She is obese.  HENT:     Head: Normocephalic and atraumatic.     Mouth/Throat:     Mouth: Mucous membranes are moist.     Pharynx: Oropharynx is clear.  Cardiovascular:     Rate and Rhythm: Normal rate and regular rhythm.  Pulmonary:     Effort: Pulmonary effort is normal.     Breath sounds: Normal breath sounds.  Abdominal:     Palpations: Abdomen is soft.     Tenderness: There is no abdominal tenderness.  Musculoskeletal:     Cervical back: Normal range of motion and neck supple.  Neurological:     General: No focal deficit present.     Mental Status: She is alert and oriented to person, place, and time.  Psychiatric:        Mood and Affect: Mood normal.        Behavior: Behavior normal.         Assessment & Plan:   Preop examination  Encounter for completion of form with patient   Patient appears medically stable to undergo the stated procedure.   No follow-ups on file.   Arlo Lama, MD

## 2023-11-21 NOTE — Progress Notes (Signed)
 S:     Chief Complaint  Patient presents with   Diabetes   Hyperlipidemia   48 y.o. female who presents for diabetes evaluation, education, and management. PMH is significant for T2DM and HLD.   Patient was referred by Primary Care Provider, Dr. Elvan Hamel, on 05/14/2023. Patient was seen by pharmacy on 07/29/23. At that visit, we continued to titrate her Mounjaro  and sent prescriptions for the 12.5 mg and 15 mg doses for further weight loss, as A1C was controlled at 6.1%.  Patient had achilles tendon repair in Feb 2025. Last seen by Dr. Elvan Hamel for diabetes on 09/13/23 - no medication changes made. Patient called in after last pharmacist visit on 09/27/23 stating she began to have severe nausea/vomiting after restarting Mounjaro  12.5 mg. We instructed her to return to Mounjaro  7.5 mg dose, which she has since requested to titrate back to the 10 mg dose. She was seen in urgent care on 11/12/23 for joint pain, myalgias, and sore throat. She was negative for COVID and flu, but her ESR was slightly elevated at 37.  Today, patient arrives in good spirits and presents without  any assistance. She reports that she recently increased Mounjaro  from 7.5 mg to 10 mg. She denies nausea, vomiting, abdominal pain since increasing the dose. She is using ondansetron  infrequently to prevent nausea on the day of her dose increase. She is using omeprazole  1-2 times per week as needed for heartburn. She denies s/sx of hyper or hypoglycemia. She is wearing her Dexcom sensor which continues to show excellent glycemic control. She continues to have muscle pain in her upper arms.  The pain is bilateral. She is also having bilateral weakness and is having difficulty lifting her arms above her head.   Family/Social History:  DM- daughter, brother, sister  No family or personal history of thyroid  cancer (mother has history of hyperthyroidism requiring radiation).   Current diabetes medications include: Mounjaro  12.5 mg weekly   Patient reports adherence to taking all medications as prescribed.   Current hyperlipidemia medications: atorvastatin  40 mg daily  Insurance coverage: Medicaid   Patient denies hypoglycemic events.  Patient denies nocturia (nighttime urination).  Patient reports neuropathy (nerve pain). Patient denies visual changes. Patient reports self foot exams.   Dietary habits: 2 meals a day -Has increased her water  intake since last visit. Still will drink regular soda occasionally. Unable to provide estimate of water  intake.  -snack: sandwiches, tacos  Exercise:  -Tries to do some walking (limited due to pain) - currently in a boot s/p achilles tendon surgery. Has R achilles surgery planned for mid-July.  O:  Date of Download: 11/22/23 - 30d Average Glucose: 131 mg/dL Glucose Management Indicator: 6.4 Time in Goal:  - Time in range 70-180: 95% - Time above range: <1% - Time below range: <1%  BP Readings from Last 3 Encounters:  11/19/23 (!) 141/77  11/12/23 115/75  10/18/23 125/82    Lab Results  Component Value Date   HGBA1C 6.0 11/22/2023   There were no vitals filed for this visit.  Lipid Panel     Component Value Date/Time   CHOL 213 (H) 05/14/2023 1048   TRIG 93 05/14/2023 1048   HDL 43 05/14/2023 1048   CHOLHDL 5.0 (H) 05/14/2023 1048   LDLCALC 153 (H) 05/14/2023 1048    Clinical Atherosclerotic Cardiovascular Disease (ASCVD): No  The 10-year ASCVD risk score (Arnett DK, et al., 2019) is: 17.4%   Values used to calculate the score:  Age: 48 years     Sex: Female     Is Non-Hispanic African American: Yes     Diabetic: Yes     Tobacco smoker: Yes     Systolic Blood Pressure: 141 mmHg     Is BP treated: No     HDL Cholesterol: 43 mg/dL     Total Cholesterol: 213 mg/dL   Patient is participating in a Managed Medicaid Plan:  Yes   A/P: Type 2 Diabetes longstanding currently controlled based on last A1c of 6.0% and TIR ~95%. Commended her for this.  Patient is tolerating Mounjaro  10 mg well and would like to remain on this dose for a couple months before reconsidering titration to Mounjaro  12.5 mg given previous experience with intolerability. Patient is able to verbalize appropriate hypoglycemia management plan. Medication adherence appears optimal.   -Continue Mounjaro  10 mg weekly  -Continue omeprazole  40 mg daily as needed to help with symptoms of belching and acid reflux. -Patient educated on purpose, proper use, and potential adverse effects of medications.  -Extensively discussed pathophysiology of diabetes, recommended lifestyle interventions, dietary effects on blood sugar control.  -Counseled on s/sx of and management of hypoglycemia.  -Due for repeat UACR - will obtain at follow-up  -Next A1c anticipated 05/2024.   Hyperlipidemia/ASCVD Risk Reduction: - Currently uncontrolled with last LDL-C of 153 mg/dL above goal < 70 mg/dL with [id number] and ASCVD 10 year risk score > 10%. High intensity statin indicated. Patient reports adherence to atorvastatin  40 mg daily, however, given new-onset bilateral muscle pain and weakness in her arms, suggested holding statin for 2 weeks to monitor for improvement and rule-out statin induced myalagias/myopathy.  - Reviewed long term complications of uncontrolled cholesterol - Recommend to hold atorvastatin  for 2 weeks. If she does not have any symptom improvement, resume atorvastatin  40 mg daily. If she does have improvement, instructed patient to reach out via MyChart so we can initiate an alternative statin therapy like rosuvastatin, that may be less likely to cause myalgias.   Written patient instructions provided. Patient verbalized understanding of treatment plan.  Total time in face to face counseling 30 minutes.    Follow-up:  Pharmacist: 01/03/24 PCP: needs to be scheduled  Arthea Larsson, PharmD PGY1 Pharmacy Resident  Marene Shape, PharmD, BCACP, CPP Clinical Pharmacist [facility] &  [facility] [phone]

## 2023-11-22 ENCOUNTER — Ambulatory Visit: Attending: Family Medicine | Admitting: Pharmacist

## 2023-11-22 ENCOUNTER — Encounter: Payer: Self-pay | Admitting: Pharmacist

## 2023-11-22 ENCOUNTER — Telehealth: Payer: Self-pay | Admitting: Podiatry

## 2023-11-22 ENCOUNTER — Other Ambulatory Visit: Payer: Self-pay

## 2023-11-22 DIAGNOSIS — Z7985 Long-term (current) use of injectable non-insulin antidiabetic drugs: Secondary | ICD-10-CM

## 2023-11-22 DIAGNOSIS — E1142 Type 2 diabetes mellitus with diabetic polyneuropathy: Secondary | ICD-10-CM | POA: Diagnosis not present

## 2023-11-22 LAB — POCT GLYCOSYLATED HEMOGLOBIN (HGB A1C): HbA1c, POC (controlled diabetic range): 6 % (ref 0.0–7.0)

## 2023-11-22 NOTE — Telephone Encounter (Signed)
 Pt called to get her surgery scheduled for [facility].   I explained to the pt that I had to discuss further with Dr Alvah Auerbach to get the time she is available and pt stated they talked about 7/8 or 7/11 a Tuesday or a Friday.   I explained to pt that I was aware of that but had to get permission from Dr Alvah Auerbach who is out of the office this week to get the time she is wanting to do the surgery.Dr Alvah Auerbach has surgery at the surgery center on Tuesdays and clinic on Fridays. She stated she needs to know this week for her job. I told pt I would talk to my administrator and let her know as soon as I could.   After checking  the days she is requesting and the Fridays she is requesting is not available as of now at [facility] and th Tuesdays time is only available at 5pm.  Per Admininistrator I will have to get authorization from Dr Sikora to do the procedure at 5pm.

## 2023-11-22 NOTE — Patient Instructions (Addendum)
 It was great to see you today! Please hold your atorvastatin  for 2 weeks. If your muscle weakness and pain improves, let us  know and we can try a different cholesterol medication. If you do not notice any change in your symptoms, it is safe to restart the atorvastatin .   Continue Mounjaro  as prescribed and let us  know if you have any issues with tolerability.   Have a great week!

## 2023-11-23 NOTE — Telephone Encounter (Signed)
 Called pt after checking with the scheduling at [facility] or and the only available date is 12/14/23 at 415pm. She did not want to do it on June 10th. I told her I would have to discuss further with Dr Alvah Auerbach as the other days she wanted 7/8 was only available around 5pm and 7/11 there is no availability.

## 2023-12-06 NOTE — Telephone Encounter (Signed)
 Discussed with Dr Alvah Auerbach and only available on a Tuesday or Friday was 7/18 at 130. Pt has been notified.

## 2023-12-09 ENCOUNTER — Other Ambulatory Visit: Payer: Self-pay

## 2023-12-16 ENCOUNTER — Ambulatory Visit (HOSPITAL_COMMUNITY)
Admission: EM | Admit: 2023-12-16 | Discharge: 2023-12-16 | Disposition: A | Discharge status: Home / Self Care | Attending: Family Medicine | Admitting: Family Medicine

## 2023-12-16 ENCOUNTER — Encounter (HOSPITAL_COMMUNITY): Payer: Self-pay | Admitting: Family Medicine

## 2023-12-16 DIAGNOSIS — L245 Irritant contact dermatitis due to other chemical products: Secondary | ICD-10-CM | POA: Diagnosis not present

## 2023-12-16 NOTE — ED Triage Notes (Signed)
 Pt c/o red itchy rash to face and back x1wk. States using benadryl  cream with little relief. Denies any new products.

## 2023-12-16 NOTE — ED Provider Notes (Signed)
 [facility]    CSN: [id number] Arrival date & time: 12/16/23  0802      History   Chief Complaint Chief Complaint  Patient presents with   Rash    HPI Ruth [name] is a 48 y.o. female.   The patient reports 1 week of rash on her cheeks and itching on her back.  She states that her husband reported no visible rash on the back but she feels like there are ants crawling in her skin that is worsened with being out in the heat.  She has had a skin rash to her face previously with sun exposure.  She denies any new lotions, foods, or exposures to pets.  She did stop using soap in favor of alcohol swabs for cleaning her face just prior to worsening of her symptoms.  She denies any itching of her mouth or throat, difficulty breathing, wheezing, nausea, vomiting, fever or chills.  The history is provided by the patient.  Rash Associated symptoms: no diarrhea, no fever, no joint pain, no myalgias, no nausea, no shortness of breath, not vomiting and not wheezing     Past Medical History:  Diagnosis Date   Abnormal uterine bleeding 2023   Anemia 2023   Anxiety    Arthritis    chronic back pain    Asthma    rarely uses inhaler   Bipolar 1 disorder (HCC)    Follows w/ PCP Dr. Abraham Abo.   Fibromyalgia    GERD (gastroesophageal reflux disease)    Headache    Hidradenitis    multiple boils, axilla and groin (As of 08/03/22, patient has a boil in her left axilla.)   HLD (hyperlipidemia)    diet controlled   Hx of migraines    occ   Hypertension    no meds   Hypothyroidism    Intentional drug overdose (HCC) 03/28/2021   Obesity    Sleep apnea    Patient states that she had a sleep study in the past and was told she did not need a CPAP.   Thrombocytosis 01/26/2020   Type 2 diabetes mellitus (HCC)    Type 2, Follows w/ PCP Dr. Abraham Abo.   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   BMI 45.0-49.9, adult (HCC) 09/04/2020   Right arm pain 02/14/2020   Hyperlipidemia associated with type 2 diabetes mellitus (HCC) 09/19/2019   Abnormal uterine bleeding (AUB) 12/07/2017   Abnormal mammogram of both breasts 11/02/2016   Hypothyroidism 06/21/2009   Type 2 diabetes mellitus with peripheral neuropathy (HCC) 06/21/2009   BIPOLAR AFFECTIVE DISORDER 06/21/2009   PERSISTENT DISORDER INITIATING/MAINTAINING SLEEP 06/21/2009   INADEQUATE SLEEP HYGIENE 06/21/2009   OBSTRUCTIVE SLEEP APNEA 06/21/2009   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   CARPAL TUNNEL SYNDROME 06/20/2009   MERALGIA PARESTHETICA 06/20/2009   Essential hypertension 06/20/2009   FIBROMYALGIA 06/20/2009   Headache 06/20/2009    Past Surgical History:  Procedure Laterality Date   ACHILLES TENDON SURGERY Left 08/13/2023   Procedure: ACHILLES TENDON REPAIR;  Surgeon: Jennefer Moats, DPM;  Location: [facility];  Service: Orthopedics/Podiatry;  Laterality: Left;   ANTERIOR CERVICAL DECOMP/DISCECTOMY FUSION N/A 01/16/2021   Procedure: Cervical five-six Anterior cervical decompression/discectomy/fusion;  Surgeon: Augusto Blonder, MD;  Location: [facility];  Service: Neurosurgery;  Laterality: N/A;   CARPAL TUNNEL RELEASE Right    around 2009   CESAREAN SECTION  x2, 2003 & 2008   CYSTOSCOPY N/A 08/12/2022   Procedure: CYSTOSCOPY;  Surgeon: Kiki Pelton, MD;  Location: [facility];  Service: Gynecology;  Laterality: N/A;   DILITATION & CURRETTAGE/HYSTROSCOPY WITH NOVASURE ABLATION N/A 12/07/2017   Procedure: DILATATION & CURETTAGE/HYSTEROSCOPY WITH ATTEMPED NOVASURE ABLATION, FAILED/ENDOMETERIAL ABLATION WITH HTA SYSTEM;  Surgeon: Lenord Radon, MD;  Location: [facility];  Service: Gynecology;  Laterality: N/A;   ELBOW SURGERY Left    surgery for nerve pain around 2009   ENDOMETRIAL BIOPSY  07/23/2022   negative for hyperplasia or malignancy   GANGLION CYST EXCISION Right     right arm    OSTECTOMY Left 08/13/2023   Procedure: CALCANEAL OSTECTOMY;  Surgeon: Jennefer Moats, DPM;  Location: [facility];  Service: Orthopedics/Podiatry;  Laterality: Left;  POPLITEAL/SAPHENOUS BLOCK   RECTOVAGINAL FISTULA CLOSURE     in [id number], 2006   ROBOTIC ASSISTED TOTAL HYSTERECTOMY Bilateral 08/12/2022   Procedure: XI ROBOTIC ASSISTED TOTAL HYSTERECTOMY WITH SALPINGECTOMY;  Surgeon: Kiki Pelton, MD;  Location: [facility];  Service: Gynecology;  Laterality: Bilateral;   TONSILLECTOMY     childhood   TUBAL LIGATION  2008   XI ROBOT ASSISTED RECTOPEXY N/A 08/12/2022   Procedure: XI robot repair serosal repair of rectum;  Surgeon: Candyce Champagne, MD;  Location: [facility];  Service: General;  Laterality: N/A;    OB History     Gravida  7   Para  5   Term  4   Preterm  1   AB  2   Living  5      SAB  2   IAB      Ectopic      Multiple      Live Births  5        Obstetric Comments  Vag x 2>c/s emergency>vbac>c/s and BTL 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 Authorizing Provider  albuterol  (PROVENTIL ) (2.5 MG/3ML) 0.083% nebulizer solution Take 3 mLs (2.5 mg total) by nebulization every 6 (six) hours as needed for wheezing or shortness of breath. 09/13/23   Abraham Abo, MD  albuterol  (VENTOLIN  HFA) [phone] Base) MCG/ACT inhaler Inhale 2 puffs into the lungs every 4 (four) hours as needed for wheezing or shortness of breath. 09/13/23   Abraham Abo, MD  atorvastatin  (LIPITOR ) 40 MG tablet Take 1 tablet (40 mg total) by mouth daily. 09/27/23   Newlin, Enobong, MD  cetirizine  (ZYRTEC  ALLERGY) 10 MG tablet Take 1 tablet (10 mg total) by mouth daily. 09/13/23 12/12/23  Abraham Abo, MD  Continuous Glucose Sensor (DEXCOM G7 SENSOR) MISC Use to check blood sugar three time daily. Change sensors once  every 10 days. 11/11/23 02/13/24  Newlin, Enobong, MD  cromolyn (OPTICROM) 4 % ophthalmic solution Place 1 drop into both eyes in the morning and at bedtime. 09/17/23   [provider]  cyclobenzaprine  (FLEXERIL ) 10 MG tablet Take 1 tablet (10 mg total) by mouth 2 (two) times daily as needed for muscle spasms. 10/18/23   Rising, Ivette Marks, PA-C  fluticasone  (FLONASE ) 50 MCG/ACT nasal spray Place 1 spray into both nostrils daily. 09/13/23   Abraham Abo, MD  fluticasone  (FLOVENT  HFA) 110 MCG/ACT inhaler Inhale 2 puffs into the lungs 2 (two) times daily. 09/13/23   Abraham Abo, MD  hydrOXYzine  (ATARAX ) 25 MG tablet Take  1 tablet (25 mg total) by mouth 3 (three) times daily as needed. 02/01/23   Abraham Abo, MD  ibuprofen  (ADVIL ) 800 MG tablet Take 1 tablet (800 mg total) by mouth 3 (three) times daily. 10/18/23   Rising, Rebecca, PA-C  Iron , Ferrous Sulfate , 325 (65 Fe) MG TABS Take 325 mg (1 tablet) by mouth daily. 11/19/23   Abraham Abo, MD  montelukast  (SINGULAIR ) 10 MG tablet Take 1 tablet (10 mg total) by mouth at bedtime. 09/13/23   Abraham Abo, MD  Multiple Vitamin (MULTIVITAMIN) capsule Take 2 capsules by mouth daily.    [provider]  OLANZapine  (ZYPREXA ) 5 MG tablet Take 1 tablet (5 mg total) by mouth at bedtime. 02/01/23   Abraham Abo, MD  omeprazole  (PRILOSEC) 40 MG capsule Take 1 capsule (40 mg total) by mouth daily. 09/27/23   Newlin, Enobong, MD  ondansetron  (ZOFRAN ) 4 MG tablet Take 1 tablet (4 mg total) by mouth every 8 (eight) hours as needed for nausea or vomiting. 10/08/23   Newlin, Enobong, MD  sertraline  (ZOLOFT ) 50 MG tablet Take 1 tablet (50 mg total) by mouth daily. 09/13/23   Abraham Abo, MD  tirzepatide  (MOUNJARO ) 10 MG/0.5ML Pen Inject 10 mg into the skin once a week. 11/11/23   Newlin, Enobong, MD  XIIDRA 5 % SOLN Apply 1 drop to eye 2 (two) times daily. 09/17/23   [provider]    Family History Family History  Problem Relation Age of  Onset   Thyroid  disease Mother    Diabetes Daughter    Diabetes Son    Breast cancer Neg Hx     Social History Social History   Tobacco Use   Smoking status: Every Day    Current packs/day: 0.25    Types: Cigarettes   Smokeless tobacco: Never   Tobacco comments:    Per patient, she only smokes 2 to 3 cigarettes daily as of 08/03/22.  Vaping Use   Vaping status: Never Used  Substance Use Topics   Alcohol use: Not Currently   Drug use: Yes    Types: Marijuana    Comment: Occasional use only     Allergies   Metformin  and related, Penicillins, and Victoza [liraglutide]   Review of Systems Review of Systems  Constitutional:  Negative for appetite change, chills and fever.  HENT:  Negative for congestion, drooling, facial swelling, trouble swallowing and voice change.   Eyes:  Negative for itching and visual disturbance.  Respiratory:  Negative for shortness of breath, wheezing and stridor.   Cardiovascular:  Negative for chest pain and leg swelling.  Gastrointestinal:  Negative for diarrhea, nausea and vomiting.  Musculoskeletal:  Negative for arthralgias and myalgias.  Skin:  Positive for rash.  Neurological:  Negative for dizziness and light-headedness.     Physical Exam Triage Vital Signs ED Triage Vitals [12/16/23 0819]  Encounter Vitals Group     BP 111/79     Girls Systolic BP Percentile      Girls Diastolic BP Percentile      Boys Systolic BP Percentile      Boys Diastolic BP Percentile      Pulse Rate 76     Resp 18     Temp 98.2 F (36.8 C)     Temp Source Oral     SpO2 95 %     Weight      Height      Head Circumference      Peak Flow  Pain Score 0     Pain Loc      Pain Education      Exclude from Growth Chart    No data found.  Updated Vital Signs BP 111/79 (BP Location: Right Arm)   Pulse 76   Temp 98.2 F (36.8 C) (Oral)   Resp 18   LMP 07/05/2022   SpO2 95%   Visual Acuity Right Eye Distance:   Left Eye Distance:    Bilateral Distance:    Right Eye Near:   Left Eye Near:    Bilateral Near:     Physical Exam Vitals reviewed.  Constitutional:      General: She is not in acute distress.    Appearance: She is not ill-appearing, toxic-appearing or diaphoretic.   Eyes:     General: No scleral icterus.    Extraocular Movements: Extraocular movements intact.     Pupils: Pupils are equal, round, and reactive to light.    Cardiovascular:     Pulses: Normal pulses.  Pulmonary:     Effort: Pulmonary effort is normal.  Abdominal:     Tenderness: There is no abdominal tenderness.   Musculoskeletal:     Right lower leg: No edema.     Left lower leg: No edema.   Skin:    Capillary Refill: Capillary refill takes 2 to 3 seconds.     Coloration: Skin is not jaundiced.     Findings: No lesion.     Comments: Inspection of the back shows no abnormalities.  There is no change in skin tone or sign of rash There is a confluent, macular, erythematous rash on both cheeks with some trace excoriations and no surrounding erythema, warmth, or pain to the touch    Neurological:     General: No focal deficit present.     Mental Status: She is alert.      UC Treatments / Results  Labs (all labs ordered are listed, but only abnormal results are displayed) Labs Reviewed - No data to display  EKG   Radiology No results found.  Procedures Procedures (including critical care time)  Medications Ordered in UC Medications - No data to display  Initial Impression / Assessment and Plan / UC Course  I have reviewed the triage vital signs and the nursing notes.  Pertinent labs & imaging results that were available during my care of the patient were reviewed by me and considered in my medical decision making (see chart for details).     Irritant contact dermatitis - The patient has a history of seasonal allergies and some redness/irritation with exposure to sunlight. - Ddx: heat rash, contact  dermatitis, or SLE - She recently started using alcohol swabs on her face for cleaning which has likely irritated the process - We discussed cessation of alcohol swabs and use of very low potency hydrocortisone cream on the area, sparingly.  I did discuss the possible side effects of skin discoloration - She will restart using unscented, soap for washing her face which she has been using for 30 years without issue. - She will also continue using emollient lotions for protection and sunscreen - The patient voiced understanding and agreement to plan.  All questions were answered.  Final Clinical Impressions(s) / UC Diagnoses   Final diagnoses:  Irritant contact dermatitis due to other chemical products     Discharge Instructions      Apply low potency steroid cream over the area very sparingly for the next several days. You  should wash the lotion off after a couple hours. Stop the use of alcohol on the face for cleaning Continue to use regular, Fragranced soap for washing the face Continue to use the emollient lotion for good hydration     ED Prescriptions   None    PDMP not reviewed this encounter.   Claybon Cuna, MD 12/16/23 1327

## 2023-12-16 NOTE — Discharge Instructions (Signed)
 Apply low potency steroid cream over the area very sparingly for the next several days. You should wash the lotion off after a couple hours. Stop the use of alcohol on the face for cleaning Continue to use regular, Fragranced soap for washing the face Continue to use the emollient lotion for good hydration

## 2023-12-17 ENCOUNTER — Other Ambulatory Visit: Payer: Self-pay

## 2023-12-24 ENCOUNTER — Other Ambulatory Visit: Payer: Self-pay

## 2023-12-27 ENCOUNTER — Telehealth: Payer: Self-pay | Admitting: Family Medicine

## 2023-12-27 NOTE — Telephone Encounter (Signed)
 A document form from [facility] has been faxed: New History and Physical form with 30 days of Surgery, to be filled out by provider. Send document back via Fax within 7-days. Document is located in providers tray at front office.          Fax number: [phone]

## 2023-12-28 ENCOUNTER — Encounter: Payer: Self-pay | Admitting: Podiatry

## 2023-12-28 ENCOUNTER — Other Ambulatory Visit: Payer: Self-pay

## 2023-12-28 ENCOUNTER — Ambulatory Visit (INDEPENDENT_AMBULATORY_CARE_PROVIDER_SITE_OTHER): Admitting: Podiatry

## 2023-12-28 DIAGNOSIS — M7662 Achilles tendinitis, left leg: Secondary | ICD-10-CM | POA: Diagnosis not present

## 2023-12-28 DIAGNOSIS — M7661 Achilles tendinitis, right leg: Secondary | ICD-10-CM

## 2023-12-28 NOTE — H&P (View-Only) (Signed)
 Subjective:  Patient ID: Ruth [name], female    DOB: [birth year],  MRN: [id number]  Chief Complaint  Patient presents with   Surgery Scheduling    Patient states that she is here to talk about surgery     DOS: 08/13/23   Procedure: Left haglunds resection with secondary achilles tendon repair    48 y.o. female returns for POV#6. Relates doing well and managing with pain. Left side well healed   Here also today to discuss plans for right foot surgery.Has some additional questions.   Review of Systems: Negative except as noted in the HPI. Denies N/V/F/Ch.  Past Medical History:  Diagnosis Date   Abnormal uterine bleeding 2023   Anemia 2023   Anxiety    Arthritis    chronic back pain    Asthma    rarely uses inhaler   Bipolar 1 disorder (HCC)    Follows w/ PCP Dr. Raguel Blush.   Fibromyalgia    GERD (gastroesophageal reflux disease)    Headache    Hidradenitis    multiple boils, axilla and groin (As of 08/03/22, patient has a boil in her left axilla.)   HLD (hyperlipidemia)    diet controlled   Hx of migraines    occ   Hypertension    no meds   Hypothyroidism    Intentional drug overdose (HCC) 03/28/2021   Obesity    Sleep apnea    Patient states that she had a sleep study in the past and was told she did not need a CPAP.   Thrombocytosis 01/26/2020   Type 2 diabetes mellitus (HCC)    Type 2, Follows w/ PCP Dr. Raguel Blush.   Wears glasses     Current Outpatient Medications:    albuterol  (PROVENTIL ) (2.5 MG/3ML) 0.083% nebulizer solution, Take 3 mLs (2.5 mg total) by nebulization every 6 (six) hours as needed for wheezing or shortness of breath., Disp: 75 mL, Rfl: 12   albuterol  (VENTOLIN  HFA) 108 (90 Base) MCG/ACT inhaler, Inhale 2 puffs into the lungs every 4 (four) hours as needed for wheezing or shortness of breath., Disp: 18 g, Rfl: 5   atorvastatin  (LIPITOR ) 40 MG tablet, Take 1 tablet (40 mg total) by mouth daily., Disp: 90 tablet, Rfl: 3    Continuous Glucose Sensor (DEXCOM G7 SENSOR) MISC, Use to check blood sugar three time daily. Change sensors once every 10 days., Disp: 3 each, Rfl: 2   cromolyn (OPTICROM) 4 % ophthalmic solution, Place 1 drop into both eyes in the morning and at bedtime., Disp: , Rfl:    cyclobenzaprine  (FLEXERIL ) 10 MG tablet, Take 1 tablet (10 mg total) by mouth 2 (two) times daily as needed for muscle spasms., Disp: 20 tablet, Rfl: 0   fluticasone  (FLONASE ) 50 MCG/ACT nasal spray, Place 1 spray into both nostrils daily., Disp: 9.9 mL, Rfl: 2   fluticasone  (FLOVENT  HFA) 110 MCG/ACT inhaler, Inhale 2 puffs into the lungs 2 (two) times daily., Disp: 1 each, Rfl: 5   hydrOXYzine  (ATARAX ) 25 MG tablet, Take 1 tablet (25 mg total) by mouth 3 (three) times daily as needed., Disp: 30 tablet, Rfl: 0   ibuprofen  (ADVIL ) 800 MG tablet, Take 1 tablet (800 mg total) by mouth 3 (three) times daily., Disp: 21 tablet, Rfl: 0   Iron , Ferrous Sulfate , 325 (65 Fe) MG TABS, Take 325 mg (1 tablet) by mouth daily., Disp: 30 tablet, Rfl: 3   montelukast  (SINGULAIR ) 10 MG tablet, Take 1 tablet (10 mg total) by  mouth at bedtime., Disp: 90 tablet, Rfl: 1   Multiple Vitamin (MULTIVITAMIN) capsule, Take 2 capsules by mouth daily., Disp: , Rfl:    OLANZapine  (ZYPREXA ) 5 MG tablet, Take 1 tablet (5 mg total) by mouth at bedtime., Disp: 90 tablet, Rfl: 0   omeprazole  (PRILOSEC) 40 MG capsule, Take 1 capsule (40 mg total) by mouth daily., Disp: 90 capsule, Rfl: 1   ondansetron  (ZOFRAN ) 4 MG tablet, Take 1 tablet (4 mg total) by mouth every 8 (eight) hours as needed for nausea or vomiting., Disp: 20 tablet, Rfl: 0   sertraline  (ZOLOFT ) 50 MG tablet, Take 1 tablet (50 mg total) by mouth daily., Disp: 90 tablet, Rfl: 0   tirzepatide  (MOUNJARO ) 10 MG/0.5ML Pen, Inject 10 mg into the skin once a week., Disp: 6 mL, Rfl: 1   XIIDRA 5 % SOLN, Apply 1 drop to eye 2 (two) times daily., Disp: , Rfl:    cetirizine  (ZYRTEC  ALLERGY) 10 MG tablet, Take 1  tablet (10 mg total) by mouth daily., Disp: 90 tablet, Rfl: 1  Social History   Tobacco Use  Smoking Status Every Day   Current packs/day: 0.25   Types: Cigarettes  Smokeless Tobacco Never  Tobacco Comments   Per patient, she only smokes 2 to 3 cigarettes daily as of 08/03/22.    Allergies  Allergen Reactions   Metformin  And Related Diarrhea   Penicillins Other (See Comments)    Unknown; from childhood Has patient had a PCN reaction causing immediate rash, facial/tongue/throat swelling, SOB or lightheadedness with hypotension: Unknow Has patient had a PCN reaction causing severe rash involving mucus membranes or skin necrosis: Unknown Has patient had a PCN reaction that required hospitalization: Unknown Has patient had a PCN reaction occurring within the last 10 years: Unknown If all of the above answers are NO, then may proceed with Cephalosporin use.    Victoza [Liraglutide] Itching   Objective:  There were no vitals filed for this visit. There is no height or weight on file to calculate BMI. Constitutional Well developed. Well nourished.  Vascular Foot warm and well perfused. Capillary refill normal to all digits.   Neurologic Normal speech. Oriented to person, place, and time. Epicritic sensation to light touch grossly present bilaterally.  Dermatologic Skin healing well without signs of infection. Skin edges well coapted without signs of infection.  Orthopedic: Tenderness to palpation noted about the surgical site. Tender on the right to insertion osite of tendon and pain with ROM of the ankle. Palpable spurring noted in area of pain.      IMPRESSION: 1. Moderate chronic ossified enthesopathic spur posterior distal to the Achilles tendon insertion with near fluid bright marrow edema within this enthesophyte. There is mild distal Achilles insertional tendinosis. Punctate 1 mm possible interstitial tear at the far distal medial Achilles tendon insertion. No tendon  retraction. These findings likely reflect acute on chronic Achilles tendinosis. 2. Moderate-to-large plantar calcaneal heel spur. The plantar fascia origin is intact. 3. Mild cartilage thinning and subchondral marrow edema within the anterior aspect of the middle subtalar joint.    Radiographs: Interval resection of spurring and haglunds deformity posterior calcaneus  Assessment:   1. Achilles tendonitis, bilateral      Plan:  Patient was evaluated and treated and all questions answered.  S/p foot surgery left -Progressing as expected post-operatively. -WB Status: May begin WBAT in regular shoes.  -Medications: n/a Discharged from surgical standpoint on left    -Xrays reviewed and MRI reviewed for right foot  -  Discussed Achilles insertional tendonitis and treatment options with patient.  -Discussed surgery last visit and answered all of patient questions  Scheduled for surgery July 18th.  Post-op: Keflex , ASA BID, Oxyc 5/325, zofran .    No follow-ups on file.

## 2023-12-28 NOTE — Telephone Encounter (Signed)
 Noted

## 2023-12-28 NOTE — Progress Notes (Signed)
 Subjective:  Patient ID: Ruth [name], female    DOB: [birth year],  MRN: [id number]  Chief Complaint  Patient presents with   Surgery Scheduling    Patient states that she is here to talk about surgery     DOS: 08/13/23   Procedure: Left haglunds resection with secondary achilles tendon repair    48 y.o. female returns for POV#6. Relates doing well and managing with pain. Left side well healed   Here also today to discuss plans for right foot surgery.Has some additional questions.   Review of Systems: Negative except as noted in the HPI. Denies N/V/F/Ch.  Past Medical History:  Diagnosis Date   Abnormal uterine bleeding 2023   Anemia 2023   Anxiety    Arthritis    chronic back pain    Asthma    rarely uses inhaler   Bipolar 1 disorder (HCC)    Follows w/ PCP Dr. Raguel Blush.   Fibromyalgia    GERD (gastroesophageal reflux disease)    Headache    Hidradenitis    multiple boils, axilla and groin (As of 08/03/22, patient has a boil in her left axilla.)   HLD (hyperlipidemia)    diet controlled   Hx of migraines    occ   Hypertension    no meds   Hypothyroidism    Intentional drug overdose (HCC) 03/28/2021   Obesity    Sleep apnea    Patient states that she had a sleep study in the past and was told she did not need a CPAP.   Thrombocytosis 01/26/2020   Type 2 diabetes mellitus (HCC)    Type 2, Follows w/ PCP Dr. Raguel Blush.   Wears glasses     Current Outpatient Medications:    albuterol  (PROVENTIL ) (2.5 MG/3ML) 0.083% nebulizer solution, Take 3 mLs (2.5 mg total) by nebulization every 6 (six) hours as needed for wheezing or shortness of breath., Disp: 75 mL, Rfl: 12   albuterol  (VENTOLIN  HFA) 108 (90 Base) MCG/ACT inhaler, Inhale 2 puffs into the lungs every 4 (four) hours as needed for wheezing or shortness of breath., Disp: 18 g, Rfl: 5   atorvastatin  (LIPITOR ) 40 MG tablet, Take 1 tablet (40 mg total) by mouth daily., Disp: 90 tablet, Rfl: 3    Continuous Glucose Sensor (DEXCOM G7 SENSOR) MISC, Use to check blood sugar three time daily. Change sensors once every 10 days., Disp: 3 each, Rfl: 2   cromolyn (OPTICROM) 4 % ophthalmic solution, Place 1 drop into both eyes in the morning and at bedtime., Disp: , Rfl:    cyclobenzaprine  (FLEXERIL ) 10 MG tablet, Take 1 tablet (10 mg total) by mouth 2 (two) times daily as needed for muscle spasms., Disp: 20 tablet, Rfl: 0   fluticasone  (FLONASE ) 50 MCG/ACT nasal spray, Place 1 spray into both nostrils daily., Disp: 9.9 mL, Rfl: 2   fluticasone  (FLOVENT  HFA) 110 MCG/ACT inhaler, Inhale 2 puffs into the lungs 2 (two) times daily., Disp: 1 each, Rfl: 5   hydrOXYzine  (ATARAX ) 25 MG tablet, Take 1 tablet (25 mg total) by mouth 3 (three) times daily as needed., Disp: 30 tablet, Rfl: 0   ibuprofen  (ADVIL ) 800 MG tablet, Take 1 tablet (800 mg total) by mouth 3 (three) times daily., Disp: 21 tablet, Rfl: 0   Iron , Ferrous Sulfate , 325 (65 Fe) MG TABS, Take 325 mg (1 tablet) by mouth daily., Disp: 30 tablet, Rfl: 3   montelukast  (SINGULAIR ) 10 MG tablet, Take 1 tablet (10 mg total) by  mouth at bedtime., Disp: 90 tablet, Rfl: 1   Multiple Vitamin (MULTIVITAMIN) capsule, Take 2 capsules by mouth daily., Disp: , Rfl:    OLANZapine  (ZYPREXA ) 5 MG tablet, Take 1 tablet (5 mg total) by mouth at bedtime., Disp: 90 tablet, Rfl: 0   omeprazole  (PRILOSEC) 40 MG capsule, Take 1 capsule (40 mg total) by mouth daily., Disp: 90 capsule, Rfl: 1   ondansetron  (ZOFRAN ) 4 MG tablet, Take 1 tablet (4 mg total) by mouth every 8 (eight) hours as needed for nausea or vomiting., Disp: 20 tablet, Rfl: 0   sertraline  (ZOLOFT ) 50 MG tablet, Take 1 tablet (50 mg total) by mouth daily., Disp: 90 tablet, Rfl: 0   tirzepatide  (MOUNJARO ) 10 MG/0.5ML Pen, Inject 10 mg into the skin once a week., Disp: 6 mL, Rfl: 1   XIIDRA 5 % SOLN, Apply 1 drop to eye 2 (two) times daily., Disp: , Rfl:    cetirizine  (ZYRTEC  ALLERGY) 10 MG tablet, Take 1  tablet (10 mg total) by mouth daily., Disp: 90 tablet, Rfl: 1  Social History   Tobacco Use  Smoking Status Every Day   Current packs/day: 0.25   Types: Cigarettes  Smokeless Tobacco Never  Tobacco Comments   Per patient, she only smokes 2 to 3 cigarettes daily as of 08/03/22.    Allergies  Allergen Reactions   Metformin  And Related Diarrhea   Penicillins Other (See Comments)    Unknown; from childhood Has patient had a PCN reaction causing immediate rash, facial/tongue/throat swelling, SOB or lightheadedness with hypotension: Unknow Has patient had a PCN reaction causing severe rash involving mucus membranes or skin necrosis: Unknown Has patient had a PCN reaction that required hospitalization: Unknown Has patient had a PCN reaction occurring within the last 10 years: Unknown If all of the above answers are NO, then may proceed with Cephalosporin use.    Victoza [Liraglutide] Itching   Objective:  There were no vitals filed for this visit. There is no height or weight on file to calculate BMI. Constitutional Well developed. Well nourished.  Vascular Foot warm and well perfused. Capillary refill normal to all digits.   Neurologic Normal speech. Oriented to person, place, and time. Epicritic sensation to light touch grossly present bilaterally.  Dermatologic Skin healing well without signs of infection. Skin edges well coapted without signs of infection.  Orthopedic: Tenderness to palpation noted about the surgical site. Tender on the right to insertion osite of tendon and pain with ROM of the ankle. Palpable spurring noted in area of pain.      IMPRESSION: 1. Moderate chronic ossified enthesopathic spur posterior distal to the Achilles tendon insertion with near fluid bright marrow edema within this enthesophyte. There is mild distal Achilles insertional tendinosis. Punctate 1 mm possible interstitial tear at the far distal medial Achilles tendon insertion. No tendon  retraction. These findings likely reflect acute on chronic Achilles tendinosis. 2. Moderate-to-large plantar calcaneal heel spur. The plantar fascia origin is intact. 3. Mild cartilage thinning and subchondral marrow edema within the anterior aspect of the middle subtalar joint.    Radiographs: Interval resection of spurring and haglunds deformity posterior calcaneus  Assessment:   1. Achilles tendonitis, bilateral      Plan:  Patient was evaluated and treated and all questions answered.  S/p foot surgery left -Progressing as expected post-operatively. -WB Status: May begin WBAT in regular shoes.  -Medications: n/a Discharged from surgical standpoint on left    -Xrays reviewed and MRI reviewed for right foot  -  Discussed Achilles insertional tendonitis and treatment options with patient.  -Discussed surgery last visit and answered all of patient questions  Scheduled for surgery July 18th.  Post-op: Keflex , ASA BID, Oxyc 5/325, zofran .    No follow-ups on file.

## 2024-01-03 ENCOUNTER — Ambulatory Visit: Attending: Family Medicine | Admitting: Pharmacist

## 2024-01-03 ENCOUNTER — Other Ambulatory Visit: Payer: Self-pay

## 2024-01-03 VITALS — Wt 238.6 lb

## 2024-01-03 DIAGNOSIS — E1142 Type 2 diabetes mellitus with diabetic polyneuropathy: Secondary | ICD-10-CM | POA: Diagnosis not present

## 2024-01-03 DIAGNOSIS — Z7985 Long-term (current) use of injectable non-insulin antidiabetic drugs: Secondary | ICD-10-CM | POA: Diagnosis not present

## 2024-01-03 MED ORDER — ROSUVASTATIN CALCIUM 20 MG PO TABS
20.0000 mg | ORAL_TABLET | Freq: Every day | ORAL | 1 refills | Status: DC
  Filled 2024-01-03: qty 90, 90d supply, fill #0
  Filled 2024-04-10: qty 90, 90d supply, fill #1

## 2024-01-03 NOTE — Progress Notes (Signed)
 S:     No chief complaint on file.  48 y.o. female who presents for diabetes evaluation, education, and management. PMH is significant for T2DM and HLD.   I have seen her consistently since 2021. Most recently, patient was referred by Primary Care Provider, Dr. Tanda, on 05/14/2023. Since then, I saw her on 06/01/23, 07/29/23, 09/27/23, 11/22/23.   When she was last seen by pharmacy on 11/22/23. We continued her Mounjaro  at the 10mg  weekly dose given hx of intolerance to the high doses. We also recommended that she continue with omeprazole  for acid reflux and eructation. In addition to her DM, pt was noted to be on high intensity statin therapy for hyperlipidemia, however, she endorsed new onset bilateral muscle pain and weakness in her arms. We instructed her to hold her atorvastatin  and let us  know via Mychart if symptoms improved or persisted. I reviewed her chart but I do not see any report from her since then.   Today, patient arrives in good spirits and presents without  any assistance. She reports that she is tolerating the Mounjaro  10 mg well. She denies nausea, vomiting, abdominal pain. In fact, she is no longer using ondansetron . She is using omeprazole  1-2 times per week as needed for heartburn. She denies s/sx of hyper or hypoglycemia. She is wearing her Dexcom sensor which continues to show excellent glycemic control. Of note, she stopped the atorvastatin  and is not having any muscle pain or weakness in her upper arms. The paint she describes today is more chronic in nature and she relates it more to her cervical spine stenosis, resulting neck surgeries, and carpal tunnel syndrome.    Family/Social History:  DM- daughter, brother, sister  No family or personal history of thyroid  cancer (mother has history of hyperthyroidism requiring radiation).   Current diabetes medications include: Mounjaro  10 mg weekly  Patient reports adherence to taking all medications as prescribed.  Current  hyperlipidemia medications: atorvastatin  40 mg daily (stopped last mont)  Insurance coverage: Medicaid   Patient denies hypoglycemic events.  Patient denies nocturia (nighttime urination).  Patient reports neuropathy (nerve pain). Patient denies visual changes. Patient reports self foot exams.   Dietary habits: 2 meals a day -Has increased her water  intake since last visit. Still will drink regular soda occasionally. Unable to provide estimate of water  intake.  -snack: sandwiches, tacos  Exercise:  -Tries to do some walking (limited due to pain) - currently in a boot s/p achilles tendon surgery. Has R achilles surgery planned for mid-July.  O:  BP Readings from Last 3 Encounters:  12/16/23 111/79  11/19/23 (!) 141/77  11/12/23 115/75    Lab Results  Component Value Date   HGBA1C 6.0 11/22/2023   There were no vitals filed for this visit.  Lipid Panel     Component Value Date/Time   CHOL 213 (H) 05/14/2023 1048   TRIG 93 05/14/2023 1048   HDL 43 05/14/2023 1048   CHOLHDL 5.0 (H) 05/14/2023 1048   LDLCALC 153 (H) 05/14/2023 1048    Clinical Atherosclerotic Cardiovascular Disease (ASCVD): No  The 10-year ASCVD risk score (Arnett DK, et al., 2019) is: 7.6%   Values used to calculate the score:     Age: 31 years     Clincally relevant sex: Female     Is Non-Hispanic African American: Yes     Diabetic: Yes     Tobacco smoker: Yes     Systolic Blood Pressure: 111 mmHg  Is BP treated: No     HDL Cholesterol: 43 mg/dL     Total Cholesterol: 213 mg/dL   Patient is participating in a Managed Medicaid Plan:  Yes   A/P: Type 2 Diabetes longstanding currently controlled based on last A1c of 6.0%. She does not have her CGM supplies present today as she needs to replace her sensor. Patient is tolerating Mounjaro  10 mg well,  perhaps better than last month even. I recommend to continue Mounjaro  10mg  weekly for now given her upcoming surgery. She has surgery planned on  01/21/24. She has been instructed to hold this 1 week prior to surgery and her last 10 mg pen on her current rxn will be used next Tuesday (01/11/24). Therefore, I recommend she stay on the 10 mg for better tolerability. I will plan to see her in ~6 weeks to make sure she is tolerating okay. As long as she is tolerating, we can increase to the 12.5 mg weekly dose later. She is amenable to this.  -Continue Mounjaro  10 mg weekly  -Continue omeprazole  40 mg daily as needed. -Patient educated on purpose, proper use, and potential adverse effects of medications.  -Extensively discussed pathophysiology of diabetes, recommended lifestyle interventions, dietary effects on blood sugar control.  -Counseled on s/sx of and management of hypoglycemia.  -Next A1c anticipated 05/2024.   Hyperlipidemia/ASCVD Risk Reduction: - Currently uncontrolled with last LDL-C of 153 mg/dL, above goal < 70 mg/dL. High intensity statin indicated. Will stop atorvastatin  given her clinical improvement off of it and start rosuvastatin 20 mg daily instead.  - Reviewed long term complications of uncontrolled cholesterol - Recommend to stop atorvastatin . - Start rosuvastatin 20 mg daily. Will reassess for any continued symptoms in 6 weeks via telephone.  Written patient instructions provided. Patient verbalized understanding of treatment plan.  Total time in face to face counseling 30 minutes.    Follow-up:  Pharmacist: 6 weeks telephone PCP: 01/12/2024  Herlene Fleeta Morris, PharmD, BCACP, CPP Clinical Pharmacist [facility] [phone]

## 2024-01-05 NOTE — Progress Notes (Signed)
 Sent message, via epic in basket, requesting orders in epic from Careers adviser.

## 2024-01-06 ENCOUNTER — Other Ambulatory Visit: Payer: Self-pay

## 2024-01-12 ENCOUNTER — Other Ambulatory Visit: Payer: Self-pay

## 2024-01-12 ENCOUNTER — Encounter: Payer: Self-pay | Admitting: Family Medicine

## 2024-01-12 ENCOUNTER — Ambulatory Visit (INDEPENDENT_AMBULATORY_CARE_PROVIDER_SITE_OTHER): Admitting: Family Medicine

## 2024-01-12 VITALS — BP 130/86 | HR 76 | Ht 67.0 in | Wt 236.2 lb

## 2024-01-12 DIAGNOSIS — Z01818 Encounter for other preprocedural examination: Secondary | ICD-10-CM

## 2024-01-12 DIAGNOSIS — Z0289 Encounter for other administrative examinations: Secondary | ICD-10-CM

## 2024-01-12 MED ORDER — HYDROXYZINE PAMOATE 25 MG PO CAPS
25.0000 mg | ORAL_CAPSULE | Freq: Three times a day (TID) | ORAL | 3 refills | Status: AC | PRN
  Filled 2024-01-12: qty 30, 10d supply, fill #0
  Filled 2024-02-21: qty 30, 10d supply, fill #1
  Filled 2024-05-02: qty 30, 10d supply, fill #2

## 2024-01-12 MED ORDER — SERTRALINE HCL 50 MG PO TABS
50.0000 mg | ORAL_TABLET | Freq: Every day | ORAL | 0 refills | Status: DC
  Filled 2024-01-12: qty 90, 90d supply, fill #0

## 2024-01-12 NOTE — Progress Notes (Signed)
 COVID Vaccine Completed:  Date of COVID positive in last 90 days:  PCP - Raguel Blush, MD Cardiologist - n/a  Chest x-ray - 01/27/23 Epic EKG - 01/27/23 Epic Stress Test - n/a ECHO - n/a Cardiac Cath - n/a Pacemaker/ICD device last checked: n/a Spinal Cord Stimulator: n/a  Bowel Prep - no  Sleep Study - yes CPAP - no  Fasting Blood Sugar -100 Checks Blood Sugar has CGM to left   Last dose of GLP1 agonist-  Ozempic , takes on Tuesdays GLP1 instructions:  Do not take after 01/13/24     Last dose of SGLT-2 inhibitors-  N/A SGLT-2 instructions:  Do not take after     Blood Thinner Instructions:  Last dose: n/a  Time: Aspirin  Instructions: Last Dose:  Activity level: Can go up a flight of stairs and perform activities of daily living without stopping and without symptoms of chest pain or shortness of breath.   Anesthesia review:   Patient denies shortness of breath, fever, cough and chest pain at PAT appointment  Patient verbalized understanding of instructions that were given to them at the PAT appointment. Patient was also instructed that they will need to review over the PAT instructions again at home before surgery.

## 2024-01-12 NOTE — Patient Instructions (Signed)
 SURGICAL WAITING ROOM VISITATION  Patients having surgery or a procedure may have no more than 2 support people in the waiting area - these visitors may rotate.    Children under the age of 72 must have an adult with them who is not the patient.  Visitors with respiratory illnesses are discouraged from visiting and should remain at home.  If the patient needs to stay at the hospital during part of their recovery, the visitor guidelines for inpatient rooms apply. Pre-op nurse will coordinate an appropriate time for 1 support person to accompany patient in pre-op.  This support person may not rotate.    Please refer to the [facility] website for the visitor guidelines for Inpatients (after your surgery is over and you are in a regular room).    Your procedure is scheduled on: 01/21/24   Report to [facility] Main Entrance    Report to admitting at 11:15 AM   Call this number if you have problems the morning of surgery [phone]   Do not eat food :After Midnight.   After Midnight you may have the following liquids until 10:30 AM DAY OF SURGERY  Water  Non-Citrus Juices (without pulp, NO RED-Apple, White grape, White cranberry) Black Coffee (NO MILK/CREAM OR CREAMERS, sugar ok)  Clear Tea (NO MILK/CREAM OR CREAMERS, sugar ok) regular and decaf                             Plain Jell-O (NO RED)                                           Fruit ices (not with fruit pulp, NO RED)                                     Popsicles (NO RED)                                                               Sports drinks like Gatorade (NO RED)                      If you have questions, please contact your surgeon's office.   FOLLOW BOWEL PREP AND ANY ADDITIONAL PRE OP INSTRUCTIONS YOU RECEIVED FROM YOUR SURGEON'S OFFICE!!!     Oral Hygiene is also important to reduce your risk of infection.                                    Remember - BRUSH YOUR TEETH THE MORNING OF SURGERY WITH YOUR  REGULAR TOOTHPASTE  DENTURES WILL BE REMOVED PRIOR TO SURGERY PLEASE DO NOT APPLY Poly grip OR ADHESIVES!!!   Do NOT smoke after Midnight   Stop all vitamins and herbal supplements 7 days before surgery.   Take these medicines the morning of surgery with A SIP OF WATER : Albuterol , Omeprazole , Zofran   DO NOT TAKE ANY ORAL DIABETIC MEDICATIONS DAY OF YOUR SURGERY  How to Manage Your  Diabetes Before and After Surgery  Why is it important to control my blood sugar before and after surgery? Improving blood sugar levels before and after surgery helps healing and can limit problems. A way of improving blood sugar control is eating a healthy diet by:  Eating less sugar and carbohydrates  Increasing activity/exercise  Talking with your doctor about reaching your blood sugar goals High blood sugars (greater than 180 mg/dL) can raise your risk of infections and slow your recovery, so you will need to focus on controlling your diabetes during the weeks before surgery. Make sure that the doctor who takes care of your diabetes knows about your planned surgery including the date and location.  How do I manage my blood sugar before surgery? Check your blood sugar at least 4 times a day, starting 2 days before surgery, to make sure that the level is not too high or low. Check your blood sugar the morning of your surgery when you wake up and every 2 hours until you get to the Short Stay unit. If your blood sugar is less than 70 mg/dL, you will need to treat for low blood sugar: Do not take insulin . Treat a low blood sugar (less than 70 mg/dL) with  cup of clear juice (cranberry or apple), 4 glucose tablets, OR glucose gel. Recheck blood sugar in 15 minutes after treatment (to make sure it is greater than 70 mg/dL). If your blood sugar is not greater than 70 mg/dL on recheck, call [phone] for further instructions. Report your blood sugar to the short stay nurse when you get to Short Stay.  If  you are admitted to the hospital after surgery: Your blood sugar will be checked by the staff and you will probably be given insulin  after surgery (instead of oral diabetes medicines) to make sure you have good blood sugar levels. The goal for blood sugar control after surgery is 80-180 mg/dL.   WHAT DO I DO ABOUT MY DIABETES MEDICATION?  Do not take oral diabetes medicines (pills) the morning of surgery.  THE NIGHT BEFORE SURGERY, take     units of       insulin .       THE MORNING OF SURGERY, take   units of         insulin .  DO NOT TAKE THE FOLLOWING 7 DAYS PRIOR TO SURGERY: Ozempic , Wegovy , Rybelsus  (Semaglutide ), Byetta (exenatide), Bydureon (exenatide ER), Victoza, Saxenda (liraglutide), or Trulicity (dulaglutide) Mounjaro  (Tirzepatide ) Adlyxin (Lixisenatide), Polyethylene Glycol Loxenatide.  Reviewed and Endorsed by [facility] Patient Education Committee, August 2015  Bring CPAP mask and tubing day of surgery.                              You may not have any metal on your body including hair pins, jewelry, and body piercing             Do not wear make-up, lotions, powders, perfumes, or deodorant  Do not wear nail polish including gel and S&S, artificial/acrylic nails, or any other type of covering on natural nails including finger and toenails. If you have artificial nails, gel coating, etc. that needs to be removed by a nail salon please have this removed prior to surgery or surgery may need to be canceled/ delayed if the surgeon/ anesthesia feels like they are unable to be safely monitored.   Do not shave  48 hours prior to surgery.  Men may shave face and neck.   Do not bring valuables to the hospital. [facility] IS NOT             RESPONSIBLE   FOR VALUABLES.   Contacts, glasses, dentures or bridgework may not be worn into surgery.  DO NOT BRING YOUR HOME MEDICATIONS TO THE HOSPITAL. PHARMACY WILL DISPENSE MEDICATIONS LISTED ON YOUR MEDICATION LIST TO  YOU DURING YOUR ADMISSION IN THE HOSPITAL!    Patients discharged on the day of surgery will not be allowed to drive home.  Someone NEEDS to stay with you for the first 24 hours after anesthesia.   Special Instructions: Bring a copy of your healthcare power of attorney and living will documents the day of surgery if you haven't scanned them before.              Please read over the following fact sheets you were given: IF YOU HAVE QUESTIONS ABOUT YOUR PRE-OP INSTRUCTIONS PLEASE CALL [phone] [place]   If you received a COVID test during your pre-op visit  it is requested that you wear a mask when out in public, stay away from anyone that may not be feeling well and notify your surgeon if you develop symptoms. If you test positive for Covid or have been in contact with anyone that has tested positive in the last 10 days please notify you surgeon.    [facility] - Preparing for Surgery Before surgery, you can play an important role.  Because skin is not sterile, your skin needs to be as free of germs as possible.  You can reduce the number of germs on your skin by washing with CHG (chlorahexidine gluconate) soap before surgery.  CHG is an antiseptic cleaner which kills germs and bonds with the skin to continue killing germs even after washing. Please DO NOT use if you have an allergy to CHG or antibacterial soaps.  If your skin becomes reddened/irritated stop using the CHG and inform your nurse when you arrive at Short Stay. Do not shave (including legs and underarms) for at least 48 hours prior to the first CHG shower.  You may shave your face/neck.  Please follow these instructions carefully:  1.  Shower with CHG Soap the night before surgery and the  morning of surgery.  2.  If you choose to wash your hair, wash your hair first as usual with your normal  shampoo.  3.  After you shampoo, rinse your hair and body thoroughly to remove the shampoo.                             4.  Use CHG as you  would any other liquid soap.  You can apply chg directly to the skin and wash.  Gently with a scrungie or clean washcloth.  5.  Apply the CHG Soap to your body ONLY FROM THE NECK DOWN.   Do   not use on face/ open                           Wound or open sores. Avoid contact with eyes, ears mouth and   genitals (private parts).                       Wash face,  Genitals (private parts) with your normal soap.  6.  Wash thoroughly, paying special attention to the area where your    surgery  will be performed.  7.  Thoroughly rinse your body with warm water  from the neck down.  8.  DO NOT shower/wash with your normal soap after using and rinsing off the CHG Soap.                9.  Pat yourself dry with a clean towel.            10.  Wear clean pajamas.            11.  Place clean sheets on your bed the night of your first shower and do not  sleep with pets. Day of Surgery : Do not apply any lotions/deodorants the morning of surgery.  Please wear clean clothes to the hospital/surgery center.  FAILURE TO FOLLOW THESE INSTRUCTIONS MAY RESULT IN THE CANCELLATION OF YOUR SURGERY  PATIENT SIGNATURE_________________________________  NURSE SIGNATURE__________________________________  ________________________________________________________________________

## 2024-01-12 NOTE — Progress Notes (Addendum)
 Established Patient Office Visit  Subjective    Patient ID: Ruth [name], female    DOB: 1976/05/01  Age: 48 y.o. MRN: [id number]  CC:  Chief Complaint  Patient presents with   Pre-op Exam    HPI Ruth [name] presents Presurgical evaluation for rt foot achilles repair scheduled for 01/21/2024 by [facility].   Outpatient Encounter Medications as of 01/12/2024  Medication Sig   albuterol  (PROVENTIL ) (2.5 MG/3ML) 0.083% nebulizer solution Take 3 mLs (2.5 mg total) by nebulization every 6 (six) hours as needed for wheezing or shortness of breath.   albuterol  (VENTOLIN  HFA) 108 (90 Base) MCG/ACT inhaler Inhale 2 puffs into the lungs every 4 (four) hours as needed for wheezing or shortness of breath.   cetirizine  (ZYRTEC  ALLERGY) 10 MG tablet Take 1 tablet (10 mg total) by mouth daily. (Patient taking differently: Take 10 mg by mouth at bedtime.)   Continuous Glucose Sensor (DEXCOM G7 SENSOR) MISC Use to check blood sugar three time daily. Change sensors once every 10 days.   cromolyn (OPTICROM) 4 % ophthalmic solution Place 1 drop into both eyes in the morning and at bedtime.   fluticasone  (FLONASE ) 50 MCG/ACT nasal spray Place 1 spray into both nostrils daily. (Patient taking differently: Place 1 spray into both nostrils at bedtime.)   fluticasone  (FLOVENT  HFA) 110 MCG/ACT inhaler Inhale 2 puffs into the lungs 2 (two) times daily.   Iron , Ferrous Sulfate , 325 (65 Fe) MG TABS Take 325 mg (1 tablet) by mouth daily. (Patient taking differently: Take 325 mg by mouth at bedtime.)   montelukast  (SINGULAIR ) 10 MG tablet Take 1 tablet (10 mg total) by mouth at bedtime.   omeprazole  (PRILOSEC) 40 MG capsule Take 1 capsule (40 mg total) by mouth daily. (Patient taking differently: Take 40 mg by mouth daily as needed (indigestion/heartburn.).)   ondansetron  (ZOFRAN ) 4 MG tablet Take 1 tablet (4 mg total) by mouth every 8 (eight) hours as needed for nausea or  vomiting.   rosuvastatin  (CRESTOR ) 20 MG tablet Take 1 tablet (20 mg total) by mouth daily. (Patient taking differently: Take 20 mg by mouth every evening.)   sertraline  (ZOLOFT ) 50 MG tablet Take 1 tablet (50 mg total) by mouth daily. (Patient taking differently: Take 50 mg by mouth at bedtime.)   tirzepatide  (MOUNJARO ) 10 MG/0.5ML Pen Inject 10 mg into the skin once a week. (Patient taking differently: Inject 10 mg into the skin every Tuesday.)   XIIDRA 5 % SOLN Place 1 drop into both eyes 2 (two) times daily.   No facility-administered encounter medications on file as of 01/12/2024.    Past Medical History:  Diagnosis Date   Abnormal uterine bleeding 2023   Anemia 2023   Anxiety    Arthritis    chronic back pain    Asthma    rarely uses inhaler   Bipolar 1 disorder (HCC)    Follows w/ PCP Dr. Raguel Blush.   Fibromyalgia    GERD (gastroesophageal reflux disease)    Headache    Hidradenitis    multiple boils, axilla and groin (As of 08/03/22, patient has a boil in her left axilla.)   HLD (hyperlipidemia)    diet controlled   Hx of migraines    occ   Hypertension    no meds   Hypothyroidism    Intentional drug overdose (HCC) 03/28/2021   Obesity    Sleep apnea    Patient states that she had a sleep  study in the past and was told she did not need a CPAP.   Thrombocytosis 01/26/2020   Type 2 diabetes mellitus (HCC)    Type 2, Follows w/ PCP Dr. Raguel Blush.   Wears glasses     Past Surgical History:  Procedure Laterality Date   ACHILLES TENDON SURGERY Left 08/13/2023   Procedure: ACHILLES TENDON REPAIR;  Surgeon: Joya Stabs, DPM;  Location: [facility];  Service: Orthopedics/Podiatry;  Laterality: Left;   ANTERIOR CERVICAL DECOMP/DISCECTOMY FUSION N/A 01/16/2021   Procedure: Cervical five-six Anterior cervical decompression/discectomy/fusion;  Surgeon: Lanis Pupa, MD;  Location: [facility];  Service: Neurosurgery;  Laterality: N/A;   CARPAL TUNNEL RELEASE Right     around 2009   CESAREAN SECTION     x2, 2003 & 2008   CYSTOSCOPY N/A 08/12/2022   Procedure: CYSTOSCOPY;  Surgeon: Jeralyn Crutch, MD;  Location: [facility];  Service: Gynecology;  Laterality: N/A;   DILITATION & CURRETTAGE/HYSTROSCOPY WITH NOVASURE ABLATION N/A 12/07/2017   Procedure: DILATATION & CURETTAGE/HYSTEROSCOPY WITH ATTEMPED NOVASURE ABLATION, FAILED/ENDOMETERIAL ABLATION WITH HTA SYSTEM;  Surgeon: Corene Coy, MD;  Location: [facility];  Service: Gynecology;  Laterality: N/A;   ELBOW SURGERY Left    surgery for nerve pain around 2009   ENDOMETRIAL BIOPSY  07/23/2022   negative for hyperplasia or malignancy   GANGLION CYST EXCISION Right    right arm    OSTECTOMY Left 08/13/2023   Procedure: CALCANEAL OSTECTOMY;  Surgeon: Joya Stabs, DPM;  Location: [facility];  Service: Orthopedics/Podiatry;  Laterality: Left;  POPLITEAL/SAPHENOUS BLOCK   RECTOVAGINAL FISTULA CLOSURE     in 8001,8000, 2006   ROBOTIC ASSISTED TOTAL HYSTERECTOMY Bilateral 08/12/2022   Procedure: XI ROBOTIC ASSISTED TOTAL HYSTERECTOMY WITH SALPINGECTOMY;  Surgeon: Jeralyn Crutch, MD;  Location: [facility];  Service: Gynecology;  Laterality: Bilateral;   TONSILLECTOMY     childhood   TUBAL LIGATION  2008   XI ROBOT ASSISTED RECTOPEXY N/A 08/12/2022   Procedure: XI robot repair serosal repair of rectum;  Surgeon: Sheldon Standing, MD;  Location: [facility] ;  Service: General;  Laterality: N/A;    Family History  Problem Relation Age of Onset   Thyroid  disease Mother    Diabetes Daughter    Diabetes Son    Breast cancer Neg Hx     Social History   Socioeconomic History   Marital status: Married    Spouse name: Not on file   Number of children: Not on file   Years of education: Not on file   Highest education level: GED or equivalent  Occupational History   Not on file  Tobacco Use   Smoking status: Every Day    Current packs/day: 0.25     Types: Cigarettes   Smokeless tobacco: Never   Tobacco comments:    Per patient, she only smokes 2 to 3 cigarettes daily as of 08/03/22.  Vaping Use   Vaping status: Never Used  Substance and Sexual Activity   Alcohol use: Not Currently   Drug use: Yes    Types: Marijuana    Comment: Occasional use only   Sexual activity: Yes    Birth control/protection: Surgical    Comment: tubal ligation  Other Topics Concern   Not on file  Social History Narrative   Not on file   Social Drivers of Health   Financial Resource Strain: Low Risk  (01/03/2024)   Overall Financial Resource Strain (CARDIA)    Difficulty of Paying Living Expenses: Not very  hard  Food Insecurity: No Food Insecurity (01/03/2024)   Hunger Vital Sign    Worried About Running Out of Food in the Last Year: Never true    Ran Out of Food in the Last Year: Never true  Transportation Needs: No Transportation Needs (01/03/2024)   PRAPARE - Administrator, Civil Service (Medical): No    Lack of Transportation (Non-Medical): No  Physical Activity: Inactive (01/03/2024)   Exercise Vital Sign    Days of Exercise per Week: 3 days    Minutes of Exercise per Session: 0 min  Stress: No Stress Concern Present (01/03/2024)   [employer] of Occupational Health - Occupational Stress Questionnaire    Feeling of Stress: Not at all  Social Connections: Moderately Isolated (01/03/2024)   Social Connection and Isolation Panel    Frequency of Communication with Friends and Family: More than three times a week    Frequency of Social Gatherings with Friends and Family: Three times a week    Attends Religious Services: Never    Active Member of Clubs or Organizations: No    Attends Banker Meetings: Not on file    Marital Status: Married  Intimate Partner Violence: Not At Risk (05/14/2023)   Humiliation, Afraid, Rape, and Kick questionnaire    Fear of Current or Ex-Partner: No    Emotionally Abused: No     Physically Abused: No    Sexually Abused: No    Review of Systems  All other systems reviewed and are negative.       Objective    BP 130/86   Pulse 76   Ht 5' 7 (1.702 m)   Wt 236 lb 3.2 oz (107.1 kg)   LMP 07/05/2022   SpO2 99%   BMI 36.99 kg/m   Physical Exam Vitals and nursing note reviewed.  Constitutional:      General: She is not in acute distress.    Appearance: She is obese.  HENT:     Head: Normocephalic and atraumatic.     Mouth/Throat:     Mouth: Mucous membranes are moist.     Pharynx: Oropharynx is clear.  Cardiovascular:     Rate and Rhythm: Normal rate and regular rhythm.  Pulmonary:     Effort: Pulmonary effort is normal.     Breath sounds: Normal breath sounds.  Abdominal:     Palpations: Abdomen is soft.     Tenderness: There is no abdominal tenderness.  Musculoskeletal:     Cervical back: Normal range of motion and neck supple.  Neurological:     General: No focal deficit present.     Mental Status: She is alert and oriented to person, place, and time.  Psychiatric:        Mood and Affect: Mood normal.        Behavior: Behavior normal.         Assessment & Plan:   1. Pre-op evaluation (Primary) Patient examined. Appears medically stable to undergo the stated procedure.    2. Encounter for completion of form with patient Pre-op form competed  No follow-ups on file.   Tanda Raguel SQUIBB, MD

## 2024-01-13 ENCOUNTER — Encounter (HOSPITAL_COMMUNITY): Payer: Self-pay

## 2024-01-13 ENCOUNTER — Other Ambulatory Visit: Payer: Self-pay

## 2024-01-13 ENCOUNTER — Encounter (HOSPITAL_COMMUNITY)
Admission: RE | Admit: 2024-01-13 | Discharge: 2024-01-13 | Disposition: A | Discharge status: Home / Self Care | Source: Ambulatory Visit | Attending: Podiatry | Admitting: Podiatry

## 2024-01-13 ENCOUNTER — Telehealth: Payer: Self-pay | Admitting: Podiatry

## 2024-01-13 VITALS — BP 145/99 | HR 73 | Temp 97.6°F | Resp 16 | Ht 67.0 in | Wt 238.0 lb

## 2024-01-13 DIAGNOSIS — Z01818 Encounter for other preprocedural examination: Secondary | ICD-10-CM | POA: Diagnosis present

## 2024-01-13 DIAGNOSIS — Z01812 Encounter for preprocedural laboratory examination: Secondary | ICD-10-CM | POA: Insufficient documentation

## 2024-01-13 DIAGNOSIS — E1142 Type 2 diabetes mellitus with diabetic polyneuropathy: Secondary | ICD-10-CM | POA: Insufficient documentation

## 2024-01-13 HISTORY — DX: Attention-deficit hyperactivity disorder, unspecified type: F90.9

## 2024-01-13 LAB — BASIC METABOLIC PANEL WITH GFR
Anion gap: 9 (ref 5–15)
BUN: 15 mg/dL (ref 6–20)
CO2: 24 mmol/L (ref 22–32)
Calcium: 8.9 mg/dL (ref 8.9–10.3)
Chloride: 102 mmol/L (ref 98–111)
Creatinine, Ser: 0.61 mg/dL (ref 0.44–1.00)
GFR, Estimated: >60 mL/min (ref >60)
Glucose, Bld: 141 mg/dL — ABNORMAL HIGH (ref 70–99)
Potassium: 4 mmol/L (ref 3.5–5.1)
Sodium: 135 mmol/L (ref 135–145)

## 2024-01-13 LAB — CBC
HCT: 39 % (ref 36.0–46.0)
Hemoglobin: 12.3 g/dL (ref 12.0–15.0)
MCH: 27.8 pg (ref 26.0–34.0)
MCHC: 31.5 g/dL (ref 30.0–36.0)
MCV: 88 fL (ref 80.0–100.0)
Platelets: 299 K/uL (ref 150–400)
RBC: 4.43 MIL/uL (ref 3.87–5.11)
RDW: 14 % (ref 11.5–15.5)
WBC: 4.1 K/uL (ref 4.0–10.5)
nRBC: 0 % (ref 0.0–0.2)

## 2024-01-13 LAB — GLUCOSE, CAPILLARY: Glucose-Capillary: 156 mg/dL — ABNORMAL HIGH (ref 70–99)

## 2024-01-13 LAB — HEMOGLOBIN A1C
Hgb A1c MFr Bld: 5.8 % — ABNORMAL HIGH (ref 4.8–5.6)
Mean Plasma Glucose: 120 mg/dL

## 2024-01-13 NOTE — Telephone Encounter (Signed)
 PER [employer] WEBSITE AUTH FOR CPT [id number] AND CPT [phone] ARE AUTHORIZED  DOS 01/21/24  AUTH # [id number]

## 2024-01-21 ENCOUNTER — Encounter (HOSPITAL_COMMUNITY)
Admission: RE | Disposition: A | Discharge status: Home / Self Care | Payer: Self-pay | Source: Ambulatory Visit | Attending: Podiatry

## 2024-01-21 ENCOUNTER — Ambulatory Visit (HOSPITAL_COMMUNITY): Payer: Self-pay | Admitting: Anesthesiology

## 2024-01-21 ENCOUNTER — Encounter (HOSPITAL_COMMUNITY): Payer: Self-pay | Admitting: Anesthesiology

## 2024-01-21 ENCOUNTER — Ambulatory Visit (HOSPITAL_COMMUNITY)
Admission: RE | Admit: 2024-01-21 | Discharge: 2024-01-21 | Disposition: A | Discharge status: Home / Self Care | Source: Ambulatory Visit | Attending: Podiatry | Admitting: Podiatry

## 2024-01-21 ENCOUNTER — Other Ambulatory Visit: Payer: Self-pay

## 2024-01-21 ENCOUNTER — Encounter (HOSPITAL_COMMUNITY): Payer: Self-pay | Admitting: Podiatry

## 2024-01-21 ENCOUNTER — Other Ambulatory Visit: Payer: Self-pay | Admitting: Podiatry

## 2024-01-21 DIAGNOSIS — M7661 Achilles tendinitis, right leg: Secondary | ICD-10-CM

## 2024-01-21 DIAGNOSIS — Z7985 Long-term (current) use of injectable non-insulin antidiabetic drugs: Secondary | ICD-10-CM | POA: Insufficient documentation

## 2024-01-21 DIAGNOSIS — M797 Fibromyalgia: Secondary | ICD-10-CM | POA: Insufficient documentation

## 2024-01-21 DIAGNOSIS — F419 Anxiety disorder, unspecified: Secondary | ICD-10-CM | POA: Insufficient documentation

## 2024-01-21 DIAGNOSIS — E119 Type 2 diabetes mellitus without complications: Secondary | ICD-10-CM | POA: Diagnosis not present

## 2024-01-21 DIAGNOSIS — D759 Disease of blood and blood-forming organs, unspecified: Secondary | ICD-10-CM | POA: Diagnosis not present

## 2024-01-21 DIAGNOSIS — I10 Essential (primary) hypertension: Secondary | ICD-10-CM | POA: Diagnosis not present

## 2024-01-21 DIAGNOSIS — E1142 Type 2 diabetes mellitus with diabetic polyneuropathy: Secondary | ICD-10-CM | POA: Diagnosis not present

## 2024-01-21 DIAGNOSIS — F319 Bipolar disorder, unspecified: Secondary | ICD-10-CM | POA: Diagnosis not present

## 2024-01-21 DIAGNOSIS — K219 Gastro-esophageal reflux disease without esophagitis: Secondary | ICD-10-CM | POA: Insufficient documentation

## 2024-01-21 DIAGNOSIS — D649 Anemia, unspecified: Secondary | ICD-10-CM | POA: Diagnosis not present

## 2024-01-21 DIAGNOSIS — G4730 Sleep apnea, unspecified: Secondary | ICD-10-CM | POA: Diagnosis not present

## 2024-01-21 DIAGNOSIS — F17210 Nicotine dependence, cigarettes, uncomplicated: Secondary | ICD-10-CM | POA: Insufficient documentation

## 2024-01-21 HISTORY — PX: CALCANEAL OSTEOTOMY: SHX1281

## 2024-01-21 HISTORY — PX: TENDON REPAIR: SHX5111

## 2024-01-21 LAB — GLUCOSE, CAPILLARY
Glucose-Capillary: 114 mg/dL — ABNORMAL HIGH (ref 70–99)
Glucose-Capillary: 124 mg/dL — ABNORMAL HIGH (ref 70–99)

## 2024-01-21 SURGERY — OSTEOTOMY, CALCANEUS
Anesthesia: General | Site: Heel | Laterality: Right

## 2024-01-21 MED ORDER — CELECOXIB 200 MG PO CAPS
200.0000 mg | ORAL_CAPSULE | Freq: Once | ORAL | Status: AC
Start: 2024-01-21 — End: 2024-01-21
  Administered 2024-01-21: 200 mg via ORAL
  Filled 2024-01-21: qty 1

## 2024-01-21 MED ORDER — BUPIVACAINE-EPINEPHRINE (PF) 0.5% -1:200000 IJ SOLN
INTRAMUSCULAR | Status: DC | PRN
  Administered 2024-01-21 (×2): 20 mL via PERINEURAL

## 2024-01-21 MED ORDER — MIDAZOLAM HCL 2 MG/2ML IJ SOLN
1.0000 mg | Freq: Once | INTRAMUSCULAR | Status: AC
  Administered 2024-01-21: 2 mg via INTRAVENOUS
  Filled 2024-01-21: qty 2

## 2024-01-21 MED ORDER — ASPIRIN 81 MG PO TBEC
81.0000 mg | DELAYED_RELEASE_TABLET | Freq: Two times a day (BID) | ORAL | 0 refills | Status: AC
  Filled 2024-01-21: qty 60, 30d supply, fill #0

## 2024-01-21 MED ORDER — ACETAMINOPHEN 500 MG PO TABS
1000.0000 mg | ORAL_TABLET | Freq: Once | ORAL | Status: AC
  Administered 2024-01-21: 1000 mg via ORAL
  Filled 2024-01-21: qty 2

## 2024-01-21 MED ORDER — OXYCODONE HCL 5 MG PO TABS: 5.0000 mg | ORAL_TABLET | Freq: Once | ORAL | Status: DC | PRN

## 2024-01-21 MED ORDER — FENTANYL CITRATE PF 50 MCG/ML IJ SOSY: 25.0000 ug | PREFILLED_SYRINGE | INTRAMUSCULAR | Status: DC | PRN

## 2024-01-21 MED ORDER — CHLORHEXIDINE GLUCONATE CLOTH 2 % EX PADS: 6.0000 | MEDICATED_PAD | Freq: Once | CUTANEOUS | Status: DC

## 2024-01-21 MED ORDER — DEXAMETHASONE SODIUM PHOSPHATE 10 MG/ML IJ SOLN
INTRAMUSCULAR | Status: AC
  Filled 2024-01-21: qty 1

## 2024-01-21 MED ORDER — LACTATED RINGERS IV SOLN: INTRAVENOUS | Status: DC

## 2024-01-21 MED ORDER — CLONIDINE HCL (ANALGESIA) 100 MCG/ML EP SOLN
EPIDURAL | Status: DC | PRN
  Administered 2024-01-21 (×2): 50 ug

## 2024-01-21 MED ORDER — PROPOFOL 10 MG/ML IV BOLUS
INTRAVENOUS | Status: DC | PRN
  Administered 2024-01-21: 150 mg via INTRAVENOUS

## 2024-01-21 MED ORDER — CLINDAMYCIN HCL 300 MG PO CAPS: 300.0000 mg | ORAL_CAPSULE | Freq: Three times a day (TID) | ORAL | 0 refills | Status: AC

## 2024-01-21 MED ORDER — BUPIVACAINE LIPOSOME 1.3 % IJ SUSP
INTRAMUSCULAR | Status: AC
  Filled 2024-01-21: qty 10

## 2024-01-21 MED ORDER — ORAL CARE MOUTH RINSE: 15.0000 mL | Freq: Once | OROMUCOSAL | Status: AC

## 2024-01-21 MED ORDER — BUPIVACAINE LIPOSOME 1.3 % IJ SUSP
INTRAMUSCULAR | Status: DC | PRN
Start: 2024-01-21 — End: 2024-01-21

## 2024-01-21 MED ORDER — FENTANYL CITRATE (PF) 100 MCG/2ML IJ SOLN
INTRAMUSCULAR | Status: DC | PRN
  Administered 2024-01-21: 100 ug via INTRAVENOUS

## 2024-01-21 MED ORDER — EPHEDRINE SULFATE-NACL 50-0.9 MG/10ML-% IV SOSY
PREFILLED_SYRINGE | INTRAVENOUS | Status: DC | PRN
  Administered 2024-01-21 (×2): 10 mg via INTRAVENOUS

## 2024-01-21 MED ORDER — FENTANYL CITRATE PF 50 MCG/ML IJ SOSY
50.0000 ug | PREFILLED_SYRINGE | Freq: Once | INTRAMUSCULAR | Status: AC
  Administered 2024-01-21: 100 ug via INTRAVENOUS
  Filled 2024-01-21: qty 2

## 2024-01-21 MED ORDER — 0.9 % SODIUM CHLORIDE (POUR BTL) OPTIME
TOPICAL | Status: DC | PRN
  Administered 2024-01-21: 1000 mL

## 2024-01-21 MED ORDER — ROCURONIUM BROMIDE 10 MG/ML (PF) SYRINGE
PREFILLED_SYRINGE | INTRAVENOUS | Status: DC | PRN
  Administered 2024-01-21: 60 mg via INTRAVENOUS

## 2024-01-21 MED ORDER — ONDANSETRON HCL 4 MG/2ML IJ SOLN
INTRAMUSCULAR | Status: AC
  Filled 2024-01-21: qty 2

## 2024-01-21 MED ORDER — CLINDAMYCIN PHOSPHATE 900 MG/50ML IV SOLN
900.0000 mg | INTRAVENOUS | Status: AC
  Administered 2024-01-21: 900 mg via INTRAVENOUS
  Filled 2024-01-21: qty 50

## 2024-01-21 MED ORDER — OXYCODONE-ACETAMINOPHEN 5-325 MG PO TABS
1.0000 | ORAL_TABLET | Freq: Four times a day (QID) | ORAL | 0 refills | Status: DC | PRN
  Filled 2024-01-21: qty 30, 4d supply, fill #0

## 2024-01-21 MED ORDER — CHLORHEXIDINE GLUCONATE 0.12 % MT SOLN
15.0000 mL | Freq: Once | OROMUCOSAL | Status: AC
  Administered 2024-01-21: 15 mL via OROMUCOSAL

## 2024-01-21 MED ORDER — PROPOFOL 10 MG/ML IV BOLUS
INTRAVENOUS | Status: AC
  Filled 2024-01-21: qty 20

## 2024-01-21 MED ORDER — FENTANYL CITRATE (PF) 100 MCG/2ML IJ SOLN
INTRAMUSCULAR | Status: AC
  Filled 2024-01-21: qty 2

## 2024-01-21 MED ORDER — INSULIN ASPART 100 UNIT/ML IJ SOLN: 0.0000 [IU] | INTRAMUSCULAR | Status: DC | PRN

## 2024-01-21 MED ORDER — OXYCODONE HCL 5 MG/5ML PO SOLN: 5.0000 mg | Freq: Once | ORAL | Status: DC | PRN

## 2024-01-21 MED ORDER — CLINDAMYCIN HCL 300 MG PO CAPS: 300.0000 mg | ORAL_CAPSULE | Freq: Three times a day (TID) | ORAL | 0 refills | Status: DC

## 2024-01-21 MED ORDER — MEPERIDINE HCL 25 MG/ML IJ SOLN: 6.2500 mg | INTRAMUSCULAR | Status: DC | PRN

## 2024-01-21 MED ORDER — LIDOCAINE HCL (PF) 2 % IJ SOLN
INTRAMUSCULAR | Status: DC | PRN
Start: 2024-01-21 — End: 2024-01-21
  Administered 2024-01-21: 100 mg via INTRADERMAL

## 2024-01-21 MED ORDER — EPHEDRINE 5 MG/ML INJ
INTRAVENOUS | Status: AC
  Filled 2024-01-21: qty 5

## 2024-01-21 MED ORDER — ROCURONIUM BROMIDE 10 MG/ML (PF) SYRINGE
PREFILLED_SYRINGE | INTRAVENOUS | Status: AC
  Filled 2024-01-21: qty 10

## 2024-01-21 MED ORDER — ONDANSETRON HCL 4 MG/2ML IJ SOLN: 4.0000 mg | Freq: Once | INTRAMUSCULAR | Status: DC | PRN

## 2024-01-21 MED ORDER — LIDOCAINE HCL (PF) 2 % IJ SOLN
INTRAMUSCULAR | Status: AC
  Filled 2024-01-21: qty 5

## 2024-01-21 MED ORDER — ONDANSETRON HCL 4 MG/2ML IJ SOLN
INTRAMUSCULAR | Status: DC | PRN
Start: 2024-01-21 — End: 2024-01-21
  Administered 2024-01-21: 4 mg via INTRAVENOUS

## 2024-01-21 MED ORDER — SUGAMMADEX SODIUM 200 MG/2ML IV SOLN
INTRAVENOUS | Status: DC | PRN
  Administered 2024-01-21: 200 mg via INTRAVENOUS

## 2024-01-21 MED ORDER — BUPIVACAINE HCL (PF) 0.5 % IJ SOLN
INTRAMUSCULAR | Status: AC
  Filled 2024-01-21: qty 30

## 2024-01-21 MED ORDER — BUPIVACAINE HCL (PF) 0.5 % IJ SOLN: INTRAMUSCULAR | Status: DC | PRN

## 2024-01-21 MED ORDER — DEXAMETHASONE SODIUM PHOSPHATE 10 MG/ML IJ SOLN
INTRAMUSCULAR | Status: DC | PRN
  Administered 2024-01-21: 10 mg via INTRAVENOUS

## 2024-01-21 SURGICAL SUPPLY — 58 items
BANDAGE ESMARK 6X9 LF (GAUZE/BANDAGES/DRESSINGS) ×1 IMPLANT
BLADE AVERAGE 25X9 (BLADE) IMPLANT
BLADE HEX COATED 2.75 (ELECTRODE) ×1 IMPLANT
BLADE MICRO SAGITTAL (BLADE) ×1 IMPLANT
BLADE SAW SGTL 14.8X61X.97 HD (BLADE) ×1 IMPLANT
BLADE SURG 15 STRL LF DISP TIS (BLADE) ×3 IMPLANT
BNDG ELASTIC 3INX 5YD STR LF (GAUZE/BANDAGES/DRESSINGS) ×1 IMPLANT
BNDG ELASTIC 4INX 5YD STR LF (GAUZE/BANDAGES/DRESSINGS) IMPLANT
BNDG ELASTIC 6INX 5YD STR LF (GAUZE/BANDAGES/DRESSINGS) ×1 IMPLANT
BNDG GAUZE DERMACEA FLUFF 4 (GAUZE/BANDAGES/DRESSINGS) ×1 IMPLANT
CHLORAPREP W/TINT 26 (MISCELLANEOUS) ×1 IMPLANT
COVER BACK TABLE 60X90IN (DRAPES) ×1 IMPLANT
COVER MAYO STAND STRL (DRAPES) IMPLANT
CUFF TRNQT CYL 24X4X16.5-23 (TOURNIQUET CUFF) IMPLANT
CUFF TRNQT CYL 34X4.125X (TOURNIQUET CUFF) IMPLANT
DRAPE C-ARM 42X120 X-RAY (DRAPES) IMPLANT
DRAPE C-ARMOR (DRAPES) ×1 IMPLANT
DRAPE EXTREMITY T 121X128X90 (DISPOSABLE) ×1 IMPLANT
DRAPE IMP U-DRAPE 54X76 (DRAPES) ×1 IMPLANT
DRAPE U-SHAPE 47X51 STRL (DRAPES) ×1 IMPLANT
DRSG ADAPTIC 3X8 NADH LF (GAUZE/BANDAGES/DRESSINGS) IMPLANT
DRSG EMULSION OIL 3X3 NADH (GAUZE/BANDAGES/DRESSINGS) IMPLANT
ELECTRODE REM PT RTRN 9FT ADLT (ELECTROSURGICAL) ×1 IMPLANT
GAUZE 4X4 16PLY ~~LOC~~+RFID DBL (SPONGE) IMPLANT
GAUZE PAD ABD 8X10 STRL (GAUZE/BANDAGES/DRESSINGS) IMPLANT
GAUZE SPONGE 4X4 12PLY STRL (GAUZE/BANDAGES/DRESSINGS) ×1 IMPLANT
GAUZE XEROFORM 1X8 LF (GAUZE/BANDAGES/DRESSINGS) ×1 IMPLANT
GLOVE BIO SURGEON STRL SZ7 (GLOVE) ×1 IMPLANT
GLOVE BIOGEL PI IND STRL 7.5 (GLOVE) ×1 IMPLANT
GOWN STRL REUS W/ TWL LRG LVL3 (GOWN DISPOSABLE) ×1 IMPLANT
IMPL SYS BIOCOMP ACH SPEED (Anchor) IMPLANT
KIT BASIN OR (CUSTOM PROCEDURE TRAY) ×1 IMPLANT
NDL HYPO 25X1 1.5 SAFETY (NEEDLE) ×1 IMPLANT
NEEDLE HYPO 25X1 1.5 SAFETY (NEEDLE) ×1 IMPLANT
NS IRRIG 1000ML POUR BTL (IV SOLUTION) IMPLANT
PAD CAST 4YDX4 CTTN HI CHSV (CAST SUPPLIES) ×1 IMPLANT
PADDING CAST ABS COTTON 4X4 ST (CAST SUPPLIES) ×1 IMPLANT
PENCIL SMOKE EVACUATOR (MISCELLANEOUS) ×1 IMPLANT
SHEET MEDIUM DRAPE 40X70 STRL (DRAPES) ×1 IMPLANT
SLEEVE SCD COMPRESS KNEE MED (STOCKING) ×1 IMPLANT
SPLINT FIBERGLASS 4X30 (CAST SUPPLIES) ×1 IMPLANT
SPONGE T-LAP 4X18 ~~LOC~~+RFID (SPONGE) ×1 IMPLANT
STAPLER SKIN PROX 35W (STAPLE) IMPLANT
STOCKINETTE 4X48 STRL (DRAPES) ×1 IMPLANT
STOCKINETTE 6 STRL (DRAPES) ×1 IMPLANT
SUCTION TUBE FRAZIER 10FR DISP (SUCTIONS) ×1 IMPLANT
SUT ETHILON 4 0 PS 2 18 (SUTURE) IMPLANT
SUT MERSILENE 4 0 P 3 (SUTURE) IMPLANT
SUT MNCRL AB 3-0 PS2 18 (SUTURE) ×1 IMPLANT
SUT MNCRL AB 4-0 PS2 18 (SUTURE) ×1 IMPLANT
SUT PROLENE 4 0 PS 2 18 (SUTURE) ×1 IMPLANT
SUT VIC AB 2-0 SH 27XBRD (SUTURE) ×1 IMPLANT
SUT VIC AB 4-0 PS2 18 (SUTURE) ×1 IMPLANT
SYR BULB EAR ULCER 3OZ GRN STR (SYRINGE) ×1 IMPLANT
SYR CONTROL 10ML LL (SYRINGE) ×1 IMPLANT
TOWEL GREEN STERILE FF (TOWEL DISPOSABLE) ×1 IMPLANT
UNDERPAD 30X36 HEAVY ABSORB (UNDERPADS AND DIAPERS) ×1 IMPLANT
YANKAUER SUCT BULB TIP NO VENT (SUCTIONS) ×1 IMPLANT

## 2024-01-21 NOTE — Interval H&P Note (Signed)
 History and Physical Interval Note:  01/21/2024 11:22 AM  Ruth [name]  has presented today for surgery, with the diagnosis of RIGHT ACHILIES TENDINITIS.  The various methods of treatment have been discussed with the patient and family. After consideration of risks, benefits and other options for treatment, the patient has consented to  Procedure(s) with comments: OSTEOTOMY, CALCANEUS (Right) - POSTERIOR POP BLOCK TENDON REPAIR (Right) as a surgical intervention.  The patient's history has been reviewed, patient examined, no change in status, stable for surgery.  I have reviewed the patient's chart and labs.  Questions were answered to the patient's satisfaction.     [name]

## 2024-01-21 NOTE — Anesthesia Postprocedure Evaluation (Signed)
 Anesthesia Post Note  Patient: Ruth [name]  Procedure(s) Performed: OSTEOTOMY, CALCANEUS (Right: Heel) TENDON REPAIR (Right: Heel)     Patient location during evaluation: PACU Anesthesia Type: General and Regional Level of consciousness: awake and alert, oriented and patient cooperative Pain management: pain level controlled Vital Signs Assessment: post-procedure vital signs reviewed and stable Respiratory status: spontaneous breathing, nonlabored ventilation and respiratory function stable Cardiovascular status: blood pressure returned to baseline and stable Postop Assessment: no apparent nausea or vomiting Anesthetic complications: no   No notable events documented.  Last Vitals:  Vitals:   01/21/24 1340 01/21/24 1508  BP: (!) 132/92 126/76  Pulse: 69 73  Resp: 16 19  Temp:  36.5 C  SpO2: 100% 99%    Last Pain:  Vitals:   01/21/24 1508  TempSrc:   PainSc: 0-No pain                 Almarie CHRISTELLA Marchi

## 2024-01-21 NOTE — Anesthesia Procedure Notes (Signed)
 Anesthesia Regional Block: Adductor canal block   Pre-Anesthetic Checklist: , timeout performed,  Correct Patient, Correct Site, Correct Laterality,  Correct Procedure, Correct Position, site marked,  Risks and benefits discussed,  Surgical consent,  Pre-op evaluation,  At surgeon's request and post-op pain management  Laterality: Right  Prep: chloraprep       Needles:  Injection technique: Single-shot  Needle Type: Echogenic Stimulator Needle     Needle Length: 5cm  Needle Gauge: 22     Additional Needles:   Procedures:, nerve stimulator,,, ultrasound used (permanent image in chart),,    Narrative:  Start time: 01/21/2024 1:40 PM End time: 01/21/2024 1:45 PM Injection made incrementally with aspirations every 5 mL.  Performed by: Personally  Anesthesiologist: Mallory Manus, MD  Additional Notes: Functioning IV was confirmed and monitors were applied.  A 50mm 22ga Arrow echogenic stimulator needle was used. Sterile prep and drape,hand hygiene and sterile gloves were used. Ultrasound guidance: relevant anatomy identified, needle position confirmed, local anesthetic spread visualized around nerve(s)., vascular puncture avoided.  Image printed for medical record. Negative aspiration and negative test dose prior to incremental administration of local anesthetic. The patient tolerated the procedure well.

## 2024-01-21 NOTE — Interval H&P Note (Signed)
 History and Physical Interval Note:  01/21/2024 1:21 PM  Ruth [name]  has presented today for surgery, with the diagnosis of RIGHT ACHILIES TENDINITIS.  The various methods of treatment have been discussed with the patient and family. After consideration of risks, benefits and other options for treatment, the patient has consented to  Procedure(s) with comments: OSTEOTOMY, CALCANEUS (Right) - POSTERIOR POP BLOCK TENDON REPAIR (Right) as a surgical intervention.  The patient's history has been reviewed, patient examined, no change in status, stable for surgery.  I have reviewed the patient's chart and labs.  Questions were answered to the patient's satisfaction.     [name]

## 2024-01-21 NOTE — Transfer of Care (Signed)
 Immediate Anesthesia Transfer of Care Note  Patient: Ruth [name]  Procedure(s) Performed: OSTEOTOMY, CALCANEUS (Right: Heel) TENDON REPAIR (Right: Heel)  Patient Location: PACU  Anesthesia Type:General  Level of Consciousness: awake, alert , and oriented  Airway & Oxygen Therapy: Patient Spontanous Breathing and Patient connected to nasal cannula oxygen  Post-op Assessment: Report given to RN and Post -op Vital signs reviewed and stable  Post vital signs: Reviewed and stable  Last Vitals:  Vitals Value Taken Time  BP 126/76 01/21/24 15:05  Temp    Pulse 73 01/21/24 15:08  Resp 19 01/21/24 15:08  SpO2 99 % 01/21/24 15:08  Vitals shown include unfiled device data.  Last Pain:  Vitals:   01/21/24 1340  TempSrc:   PainSc: 0-No pain         Complications: No notable events documented.

## 2024-01-21 NOTE — Anesthesia Procedure Notes (Signed)
 Anesthesia Regional Block: Popliteal block   Pre-Anesthetic Checklist: , timeout performed,  Correct Patient, Correct Site, Correct Laterality,  Correct Procedure, Correct Position, site marked,  Risks and benefits discussed,  Surgical consent,  Pre-op evaluation,  At surgeon's request and post-op pain management  Laterality: Right  Prep: chloraprep       Needles:  Injection technique: Single-shot  Needle Type: Echogenic Stimulator Needle     Needle Length: 5cm  Needle Gauge: 22     Additional Needles:   Procedures:,,,, ultrasound used (permanent image in chart),,     Nerve Stimulator or Paresthesia:  Response: foot  Additional Responses:   Narrative:  Start time: 01/21/2024 1:35 PM End time: 01/21/2024 1:40 PM Injection made incrementally with aspirations every 5 mL.  Performed by: Personally  Anesthesiologist: Mallory Manus, MD  Additional Notes: Functioning IV was confirmed and monitors were applied.  A 50mm 22ga Arrow echogenic stimulator needle was used. Sterile prep and drape,hand hygiene and sterile gloves were used. Ultrasound guidance: relevant anatomy identified, needle position confirmed, local anesthetic spread visualized around nerve(s)., vascular puncture avoided.  Image printed for medical record. Negative aspiration and negative test dose prior to incremental administration of local anesthetic. The patient tolerated the procedure well.

## 2024-01-21 NOTE — Brief Op Note (Signed)
 01/21/2024  2:50 PM  PATIENT:  Ruth [name]  48 y.o. female  PRE-OPERATIVE DIAGNOSIS:  RIGHT ACHILIES TENDINITIS  POST-OPERATIVE DIAGNOSIS:  * No post-op diagnosis entered *  PROCEDURE:  Procedure(s) with comments: OSTEOTOMY, CALCANEUS (Right) - POSTERIOR POP BLOCK TENDON REPAIR (Right)  SURGEON:  Surgeons and Role:    * Joya Stabs, DPM - Primary  PHYSICIAN ASSISTANT:   ASSISTANTS: none   ANESTHESIA:   regional and MAC  EBL:  minimal   BLOOD ADMINISTERED:none  DRAINS: none   LOCAL MEDICATIONS USED:  NONE  SPECIMEN:  No Specimen  DISPOSITION OF SPECIMEN:  N/A  COUNTS:  YES  TOURNIQUET:   Total Tourniquet Time Documented: Calf (Right) - 39 minutes Total: Calf (Right) - 39 minutes   DICTATION: .Note written in EPIC  PLAN OF CARE: Discharge to home after PACU  PATIENT DISPOSITION:  PACU - hemodynamically stable.   Delay start of Pharmacological VTE agent (>24hrs) due to surgical blood loss or risk of bleeding: not applicable

## 2024-01-21 NOTE — Op Note (Signed)
 OPERATIVE REPORT Patient name: Ruth [name] MRN: [id number] DOB: 08/18/1975  DOS:  01/21/24  Preop Dx: Type 2 diabetes mellitus with peripheral neuropathy (HCC) - Plan: CBG per Guidelines for Diabetes Management for Patients Undergoing Surgery ([facility], [facility], and [facility] only), CBG per Guidelines for Diabetes Management for Patients Undergoing Surgery ([facility], [facility], and [facility] only) Postop Dx: same  Procedure:  1. Right calcaneal haglunds resection with secondary achilles tendon repair   Surgeon: [name], DPM  Anesthesia: Pre-op Pop/saph block infiltrated in the patient's right  lower extremity  Hemostasis: Thigh TQ at 250 mmHg.   EBL: <10 mL Materials: Arthrex achilles speed bridge.  Injectables: n/a Pathology: n/a   Condition: The patient tolerated the procedure and anesthesia well. No complications noted or reported    Justification for procedure: The patient is a 48 y.o. female who presents today for surgical correction of achilles haglund deformity and achilles tendonitis. All conservative modalities of been unsuccessful in providing any sort of satisfactory alleviation of symptoms with the patient. The patient was told benefits as well as possible side effects of the surgery. The patient consented for surgical correction. The patient consent form was reviewed. All patient questions were answered. No guarantees were expressed or implied. The patient and the surgeon both signed the patient consent form with the witness present and placed in the patient's chart.     Procedure in Detail: The patient was brought to the operating room, placed in the operating table in the supine position at which time an aseptic scrub and drape were performed about the patient's respective lower extremity after anesthesia was induced as described above. Attention was then directed to the surgical area where procedure number one commenced.   Procedure #1:   The right lower extremity was elevated,  examined and the thigh tourniquet was inflated to 250  mmHg.   I then directed attention to the distal Achilles tendon and the posterior calcaneus where a linear incision was made extending from the glabrous junction of the heel to the midsubstance of the Achilles tendon. This was carried through subcutaneous tissue, cauterizing bleeding vessels as necessary. The paratenon was identified and incised and reflected, and the distal Achilles tendon was dissected medially and laterally. It attached to a large spur distally and this was resected using an osteotome and sagittal saw and smoothed with a power rasp. The Achilles tendon was reflected from the posterior calcaneus superiorly as well and the Haglund's deformity was identified. This was then resected using an osteotome and sagittal saw and smoothed with a power rasp. The Achilles tendon exhibited significant degenerative changes with fatty infiltration and intratendinous calcifications. These were debrided from the healthy tendon and it was reduced in bulk and thickness with a scalpel.   The wound was thoroughly irrigated with saline. Arthrex achilles speed bridge was then incorporated in the repair site. Repair was tested and felt to be strong  The wound was then closed in layers using 3-0 monocryl, and 3-0 nylon .     Dry sterile compressive dressings were then applied to all previously mentioned incision sites about the patient's lower extremity. The patient was then transferred from the operating room to the recovery room having tolerated the procedure and anesthesia well. All vital signs are stable. After a brief stay in the recovery room the patient was discharged     Disposition:  Patient will be non weightbearing to the right lower extremity. Will keep dressing clean dry and intact until follow-up with me.  Post-op pain and nausea medication provided. Will take keflex  and ASA as prescribed. Follow-up with me in one week for post-op check    [name], DPM [facility]  Dr. [name], DPM    [address] [place], [place] [address]                Office [phone]  Fax [phone]

## 2024-01-21 NOTE — Anesthesia Procedure Notes (Signed)
 Procedure Name: Intubation Date/Time: 01/21/2024 1:53 PM  Performed by: Carleton Garnette SAUNDERS, CRNAPre-anesthesia Checklist: Patient identified, Emergency Drugs available, Suction available, Patient being monitored and Timeout performed Patient Re-evaluated:Patient Re-evaluated prior to induction Oxygen Delivery Method: Circle system utilized Preoxygenation: Pre-oxygenation with 100% oxygen Induction Type: IV induction Ventilation: Mask ventilation without difficulty Laryngoscope Size: Mac and 4 Grade View: Grade II Tube type: Oral Tube size: 7.0 mm Number of attempts: 1 Airway Equipment and Method: Stylet Placement Confirmation: ETT inserted through vocal cords under direct vision, positive ETCO2 and breath sounds checked- equal and bilateral Secured at: 22 cm Tube secured with: Tape Dental Injury: Teeth and Oropharynx as per pre-operative assessment

## 2024-01-21 NOTE — Anesthesia Preprocedure Evaluation (Signed)
 Anesthesia Evaluation  Patient identified by MRN, date of birth, ID band Patient awake    Reviewed: Allergy & Precautions, H&P , NPO status , Patient's Chart, lab work & pertinent test results  Airway Mallampati: III  TM Distance: >3 FB Neck ROM: Full  Mouth opening: Limited Mouth Opening  Dental no notable dental hx. (+) Teeth Intact, Dental Advisory Given   Pulmonary asthma , sleep apnea , Current Smoker and Patient abstained from smoking.   Pulmonary exam normal breath sounds clear to auscultation       Cardiovascular Exercise Tolerance: Good hypertension, Normal cardiovascular exam Rhythm:Regular Rate:Normal     Neuro/Psych  Headaches PSYCHIATRIC DISORDERS Anxiety  Bipolar Disorder    Neuromuscular disease    GI/Hepatic Neg liver ROS,GERD  ,,  Endo/Other  diabetes, Type 2, Oral Hypoglycemic AgentsHypothyroidism    Renal/GU negative Renal ROS  negative genitourinary   Musculoskeletal  (+) Arthritis ,  Fibromyalgia -  Abdominal   Peds  Hematology  (+) Blood dyscrasia, anemia   Anesthesia Other Findings   Reproductive/Obstetrics negative OB ROS                              Anesthesia Physical Anesthesia Plan  ASA: 2  Anesthesia Plan: General   Post-op Pain Management: Tylenol  PO (pre-op)*, Regional block* and Celebrex  PO (pre-op)*   Induction: Intravenous  PONV Risk Score and Plan: 2 and Midazolam , Dexamethasone  and Ondansetron   Airway Management Planned: Oral ETT  Additional Equipment:   Intra-op Plan:   Post-operative Plan: Extubation in OR  Informed Consent: I have reviewed the patients History and Physical, chart, labs and discussed the procedure including the risks, benefits and alternatives for the proposed anesthesia with the patient or authorized representative who has indicated his/her understanding and acceptance.     Dental advisory given  Plan Discussed with:  CRNA and Anesthesiologist  Anesthesia Plan Comments:         Anesthesia Quick Evaluation

## 2024-01-21 NOTE — Progress Notes (Signed)
 Orthopedic Tech Progress Note Patient Details:  Ruth [name] 1976-01-04 [id number]  Ortho Devices Type of Ortho Device: CAM walker Ortho Device/Splint Location: RLE Ortho Device/Splint Interventions: Ordered, Application, Adjustment   Post Interventions Patient Tolerated: Well Instructions Provided: Adjustment of device  [name] 01/21/2024, 6:15 PM

## 2024-01-22 ENCOUNTER — Other Ambulatory Visit (HOSPITAL_COMMUNITY): Payer: Self-pay

## 2024-01-24 ENCOUNTER — Encounter (HOSPITAL_COMMUNITY): Payer: Self-pay | Admitting: Podiatry

## 2024-01-26 ENCOUNTER — Ambulatory Visit (INDEPENDENT_AMBULATORY_CARE_PROVIDER_SITE_OTHER)

## 2024-01-26 ENCOUNTER — Encounter: Payer: Self-pay | Admitting: Podiatry

## 2024-01-26 ENCOUNTER — Other Ambulatory Visit: Payer: Self-pay

## 2024-01-26 ENCOUNTER — Ambulatory Visit (INDEPENDENT_AMBULATORY_CARE_PROVIDER_SITE_OTHER): Admitting: Podiatry

## 2024-01-26 VITALS — BP 142/90 | HR 76

## 2024-01-26 DIAGNOSIS — M7662 Achilles tendinitis, left leg: Secondary | ICD-10-CM

## 2024-01-26 DIAGNOSIS — M7661 Achilles tendinitis, right leg: Secondary | ICD-10-CM | POA: Diagnosis not present

## 2024-01-26 MED ORDER — OXYCODONE-ACETAMINOPHEN 5-325 MG PO TABS
1.0000 | ORAL_TABLET | Freq: Four times a day (QID) | ORAL | 0 refills | Status: DC | PRN
  Filled 2024-01-26: qty 30, 4d supply, fill #0

## 2024-01-26 NOTE — Progress Notes (Signed)
 Subjective:  Patient ID: Ruth [name], female    DOB: March 08, 1976,  MRN: [id number]  Chief Complaint  Patient presents with   Routine Post Op    POV # 1 DOS 01/21/24 RT HAGLUNDS RESECTION W/ 2NDARY ACHILLES REPAIR     DOS: 01/21/24  Procedure: Right halgunds resection with secondary achilles tendon repair  48 y.o. female returns for POV#1. Relates doing well still getting a lot of pain. Harder to get around this time.   Review of Systems: Negative except as noted in the HPI. Denies N/V/F/Ch.  Past Medical History:  Diagnosis Date   Abnormal uterine bleeding 2023   ADHD (attention deficit hyperactivity disorder)    Anemia 2023   Anxiety    Arthritis    chronic back pain    Asthma    rarely uses inhaler   Bipolar 1 disorder (HCC)    Follows w/ PCP Dr. Raguel [name].   Fibromyalgia    GERD (gastroesophageal reflux disease)    Headache    Hidradenitis    multiple boils, axilla and groin (As of 08/03/22, patient has a boil in her left axilla.)   HLD (hyperlipidemia)    diet controlled   Hx of migraines    occ   Hypertension    no meds   Hypothyroidism    Intentional drug overdose (HCC) 03/28/2021   Obesity    Sleep apnea    Patient states that she had a sleep study in the past and was told she did not need a CPAP.   Thrombocytosis 01/26/2020   Type 2 diabetes mellitus (HCC)    Type 2, Follows w/ PCP Dr. Raguel [name].   Wears glasses     Current Outpatient Medications:    albuterol  (PROVENTIL ) (2.5 MG/3ML) 0.083% nebulizer solution, Take 3 mLs (2.5 mg total) by nebulization every 6 (six) hours as needed for wheezing or shortness of breath., Disp: 75 mL, Rfl: 12   albuterol  (VENTOLIN  HFA) 108 (90 Base) MCG/ACT inhaler, Inhale 2 puffs into the lungs every 4 (four) hours as needed for wheezing or shortness of breath., Disp: 18 g, Rfl: 5   aspirin  EC 81 MG tablet, Take 1 tablet (81 mg total) by mouth in the morning and at bedtime. Swallow whole., Disp: 60  tablet, Rfl: 0   cetirizine  (ZYRTEC  ALLERGY) 10 MG tablet, Take 1 tablet (10 mg total) by mouth daily. (Patient taking differently: Take 10 mg by mouth at bedtime.), Disp: 90 tablet, Rfl: 1   clindamycin  (CLEOCIN ) 300 MG capsule, Take 1 capsule (300 mg total) by mouth 3 (three) times daily for 5 days., Disp: 15 capsule, Rfl: 0   Continuous Glucose Sensor (DEXCOM G7 SENSOR) MISC, Use to check blood sugar three time daily. Change sensors once every 10 days., Disp: 3 each, Rfl: 2   cromolyn (OPTICROM) 4 % ophthalmic solution, Place 1 drop into both eyes in the morning and at bedtime., Disp: , Rfl:    fluticasone  (FLONASE ) 50 MCG/ACT nasal spray, Place 1 spray into both nostrils daily. (Patient taking differently: Place 1 spray into both nostrils at bedtime.), Disp: 9.9 mL, Rfl: 2   fluticasone  (FLOVENT  HFA) 110 MCG/ACT inhaler, Inhale 2 puffs into the lungs 2 (two) times daily., Disp: 1 each, Rfl: 5   hydrOXYzine  (VISTARIL ) 25 MG capsule, Take 1 capsule (25 mg total) by mouth every 8 (eight) hours as needed for anxiety., Disp: 30 capsule, Rfl: 3   Iron , Ferrous Sulfate , 325 (65 Fe) MG TABS, Take 325 mg (1  tablet) by mouth daily. (Patient taking differently: Take 325 mg by mouth at bedtime.), Disp: 30 tablet, Rfl: 3   montelukast  (SINGULAIR ) 10 MG tablet, Take 1 tablet (10 mg total) by mouth at bedtime., Disp: 90 tablet, Rfl: 1   omeprazole  (PRILOSEC) 40 MG capsule, Take 1 capsule (40 mg total) by mouth daily. (Patient taking differently: Take 40 mg by mouth daily as needed (indigestion/heartburn.).), Disp: 90 capsule, Rfl: 1   ondansetron  (ZOFRAN ) 4 MG tablet, Take 1 tablet (4 mg total) by mouth every 8 (eight) hours as needed for nausea or vomiting., Disp: 20 tablet, Rfl: 0   oxyCODONE -acetaminophen  (PERCOCET/ROXICET) 5-325 MG tablet, Take 1-2 tablets by mouth every 6 (six) hours as needed for severe pain (pain score 7-10)., Disp: 30 tablet, Rfl: 0   rosuvastatin  (CRESTOR ) 20 MG tablet, Take 1 tablet (20  mg total) by mouth daily. (Patient taking differently: Take 20 mg by mouth every evening.), Disp: 90 tablet, Rfl: 1   sertraline  (ZOLOFT ) 50 MG tablet, Take 1 tablet (50 mg total) by mouth daily., Disp: 90 tablet, Rfl: 0   tirzepatide  (MOUNJARO ) 10 MG/0.5ML Pen, Inject 10 mg into the skin once a week. (Patient taking differently: Inject 10 mg into the skin every Tuesday.), Disp: 6 mL, Rfl: 1   XIIDRA 5 % SOLN, Place 1 drop into both eyes 2 (two) times daily., Disp: , Rfl:   Social History   Tobacco Use  Smoking Status Every Day   Current packs/day: 0.25   Types: Cigarettes  Smokeless Tobacco Never  Tobacco Comments   Per patient, she only smokes 2 to 3 cigarettes daily as of 08/03/22.    Allergies  Allergen Reactions   Metformin  And Related Diarrhea   Penicillins Other (See Comments)    Unknown; from childhood Has patient had a PCN reaction causing immediate rash, facial/tongue/throat swelling, SOB or lightheadedness with hypotension: Unknow Has patient had a PCN reaction causing severe rash involving mucus membranes or skin necrosis: Unknown Has patient had a PCN reaction that required hospitalization: Unknown Has patient had a PCN reaction occurring within the last 10 years: Unknown If all of the above answers are NO, then may proceed with Cephalosporin use.    Victoza [Liraglutide] Itching   Objective:  There were no vitals filed for this visit. There is no height or weight on file to calculate BMI. Constitutional Well developed. Well nourished.  Vascular Foot warm and well perfused. Capillary refill normal to all digits.   Neurologic Normal speech. Oriented to person, place, and time. Epicritic sensation to light touch grossly present bilaterally.  Dermatologic Skin healing well without signs of infection. Skin edges well coapted without signs of infection.  Orthopedic: Tenderness to palpation noted about the surgical site.   Radiographs: Interval resection of spurring  and halgund deformity noted Assessment:   1. Achilles tendonitis, bilateral    Plan:  Patient was evaluated and treated and all questions answered.  S/p foot surgery right -Progressing as expected post-operatively. -WB Status: NWB in CAM boot -Sutures: intact. -Medications: Oxycodone  refilled for pain.  -Foot redressed.  Return in 2 weeks for suture removal.  No follow-ups on file.

## 2024-02-09 ENCOUNTER — Encounter: Payer: Self-pay | Admitting: Podiatry

## 2024-02-09 ENCOUNTER — Ambulatory Visit (INDEPENDENT_AMBULATORY_CARE_PROVIDER_SITE_OTHER): Admitting: Podiatry

## 2024-02-09 ENCOUNTER — Other Ambulatory Visit: Payer: Self-pay

## 2024-02-09 VITALS — BP 122/84 | HR 78 | Temp 97.7°F

## 2024-02-09 DIAGNOSIS — M7662 Achilles tendinitis, left leg: Secondary | ICD-10-CM

## 2024-02-09 DIAGNOSIS — Z9889 Other specified postprocedural states: Secondary | ICD-10-CM

## 2024-02-09 DIAGNOSIS — M7661 Achilles tendinitis, right leg: Secondary | ICD-10-CM

## 2024-02-09 MED ORDER — IBUPROFEN 600 MG PO TABS
600.0000 mg | ORAL_TABLET | Freq: Three times a day (TID) | ORAL | 0 refills | Status: DC | PRN
  Filled 2024-02-09: qty 30, 10d supply, fill #0

## 2024-02-09 NOTE — Progress Notes (Signed)
 Subjective:  Patient ID: Ruth [name], female    DOB: Jun 18, 1976,  MRN: [id number]  Chief Complaint  Patient presents with   Routine Post Op    POV # 2 DOS 01/21/24 RT HAGLUNDS RESECTION W/ 2NDARY ACHILLES REPAIR It's good.  My pain level is a six.      DOS: 01/21/24  Procedure: Right halgunds resection with secondary achilles tendon repair  48 y.o. female returns for POV#2. Relates doing well and pain improving.   Review of Systems: Negative except as noted in the HPI. Denies N/V/F/Ch.  Past Medical History:  Diagnosis Date   Abnormal uterine bleeding 2023   ADHD (attention deficit hyperactivity disorder)    Anemia 2023   Anxiety    Arthritis    chronic back pain    Asthma    rarely uses inhaler   Bipolar 1 disorder (HCC)    Follows w/ PCP Dr. Raguel Blush.   Fibromyalgia    GERD (gastroesophageal reflux disease)    Headache    Hidradenitis    multiple boils, axilla and groin (As of 08/03/22, patient has a boil in her left axilla.)   HLD (hyperlipidemia)    diet controlled   Hx of migraines    occ   Hypertension    no meds   Hypothyroidism    Intentional drug overdose (HCC) 03/28/2021   Obesity    Sleep apnea    Patient states that she had a sleep study in the past and was told she did not need a CPAP.   Thrombocytosis 01/26/2020   Type 2 diabetes mellitus (HCC)    Type 2, Follows w/ PCP Dr. Raguel Blush.   Wears glasses     Current Outpatient Medications:    albuterol  (PROVENTIL ) (2.5 MG/3ML) 0.083% nebulizer solution, Take 3 mLs (2.5 mg total) by nebulization every 6 (six) hours as needed for wheezing or shortness of breath., Disp: 75 mL, Rfl: 12   albuterol  (VENTOLIN  HFA) 108 (90 Base) MCG/ACT inhaler, Inhale 2 puffs into the lungs every 4 (four) hours as needed for wheezing or shortness of breath., Disp: 18 g, Rfl: 5   aspirin  EC 81 MG tablet, Take 1 tablet (81 mg total) by mouth in the morning and at bedtime. Swallow whole., Disp: 60 tablet,  Rfl: 0   cetirizine  (ZYRTEC  ALLERGY) 10 MG tablet, Take 1 tablet (10 mg total) by mouth daily. (Patient taking differently: Take 10 mg by mouth at bedtime.), Disp: 90 tablet, Rfl: 1   Continuous Glucose Sensor (DEXCOM G7 SENSOR) MISC, Use to check blood sugar three time daily. Change sensors once every 10 days., Disp: 3 each, Rfl: 2   cromolyn (OPTICROM) 4 % ophthalmic solution, Place 1 drop into both eyes in the morning and at bedtime., Disp: , Rfl:    fluticasone  (FLONASE ) 50 MCG/ACT nasal spray, Place 1 spray into both nostrils daily. (Patient taking differently: Place 1 spray into both nostrils at bedtime.), Disp: 9.9 mL, Rfl: 2   fluticasone  (FLOVENT  HFA) 110 MCG/ACT inhaler, Inhale 2 puffs into the lungs 2 (two) times daily., Disp: 1 each, Rfl: 5   hydrOXYzine  (VISTARIL ) 25 MG capsule, Take 1 capsule (25 mg total) by mouth every 8 (eight) hours as needed for anxiety., Disp: 30 capsule, Rfl: 3   Iron , Ferrous Sulfate , 325 (65 Fe) MG TABS, Take 325 mg (1 tablet) by mouth daily. (Patient taking differently: Take 325 mg by mouth at bedtime.), Disp: 30 tablet, Rfl: 3   montelukast  (SINGULAIR ) 10 MG tablet,  Take 1 tablet (10 mg total) by mouth at bedtime., Disp: 90 tablet, Rfl: 1   omeprazole  (PRILOSEC) 40 MG capsule, Take 1 capsule (40 mg total) by mouth daily. (Patient taking differently: Take 40 mg by mouth daily as needed (indigestion/heartburn.).), Disp: 90 capsule, Rfl: 1   ondansetron  (ZOFRAN ) 4 MG tablet, Take 1 tablet (4 mg total) by mouth every 8 (eight) hours as needed for nausea or vomiting., Disp: 20 tablet, Rfl: 0   oxyCODONE -acetaminophen  (PERCOCET/ROXICET) 5-325 MG tablet, Take 1-2 tablets by mouth every 6 (six) hours as needed for severe pain (pain score 7-10)., Disp: 30 tablet, Rfl: 0   rosuvastatin  (CRESTOR ) 20 MG tablet, Take 1 tablet (20 mg total) by mouth daily. (Patient taking differently: Take 20 mg by mouth every evening.), Disp: 90 tablet, Rfl: 1   sertraline  (ZOLOFT ) 50 MG  tablet, Take 1 tablet (50 mg total) by mouth daily., Disp: 90 tablet, Rfl: 0   tirzepatide  (MOUNJARO ) 10 MG/0.5ML Pen, Inject 10 mg into the skin once a week. (Patient taking differently: Inject 10 mg into the skin every Tuesday.), Disp: 6 mL, Rfl: 1   XIIDRA 5 % SOLN, Place 1 drop into both eyes 2 (two) times daily., Disp: , Rfl:   Social History   Tobacco Use  Smoking Status Every Day   Current packs/day: 0.25   Types: Cigarettes  Smokeless Tobacco Never  Tobacco Comments   Per patient, she only smokes 2 to 3 cigarettes daily as of 08/03/22.    Allergies  Allergen Reactions   Metformin  And Related Diarrhea   Penicillins Other (See Comments)    Unknown; from childhood Has patient had a PCN reaction causing immediate rash, facial/tongue/throat swelling, SOB or lightheadedness with hypotension: Unknow Has patient had a PCN reaction causing severe rash involving mucus membranes or skin necrosis: Unknown Has patient had a PCN reaction that required hospitalization: Unknown Has patient had a PCN reaction occurring within the last 10 years: Unknown If all of the above answers are NO, then may proceed with Cephalosporin use.    Victoza [Liraglutide] Itching   Objective:   Vitals:   02/09/24 0944  BP: 122/84  Pulse: 78  Temp: 97.7 F (36.5 C)   There is no height or weight on file to calculate BMI. Constitutional Well developed. Well nourished.  Vascular Foot warm and well perfused. Capillary refill normal to all digits.   Neurologic Normal speech. Oriented to person, place, and time. Epicritic sensation to light touch grossly present bilaterally.  Dermatologic Skin healing well without signs of infection. Skin edges well coapted without signs of infection.  Orthopedic: Tenderness to palpation noted about the surgical site.   Radiographs: Interval resection of spurring and halgund deformity noted Assessment:   1. Status post surgery   2. Achilles tendonitis, bilateral     Plan:  Patient was evaluated and treated and all questions answered.  S/p foot surgery right -Progressing as expected post-operatively. -WB Status: NWB in CAM boot one more week then may begin WBAT.  -Sutures: removed today without incident.  -Medications: ibuprofen  600 mg sent to pharmacy for pain.  -Foot redressed.  Return in 3 weeks for recheck   No follow-ups on file.

## 2024-02-11 ENCOUNTER — Other Ambulatory Visit (HOSPITAL_COMMUNITY): Payer: Self-pay

## 2024-02-11 ENCOUNTER — Telehealth: Payer: Self-pay | Admitting: Pharmacist

## 2024-02-11 ENCOUNTER — Telehealth: Payer: Self-pay | Admitting: Podiatry

## 2024-02-11 NOTE — Telephone Encounter (Signed)
 Patient called requesting to speak with Dr. Joya. Patient is out of town. She would like to get provider's opinion on what she needs to do, patient fell, when stepping out of the shower on right foot. Contact telephone number, [phone]

## 2024-02-11 NOTE — Telephone Encounter (Signed)
 Called pt to confirm appt. Pt will be present.

## 2024-02-14 ENCOUNTER — Ambulatory Visit: Admitting: Podiatry

## 2024-02-14 ENCOUNTER — Encounter: Payer: Self-pay | Admitting: Pharmacist

## 2024-02-14 ENCOUNTER — Ambulatory Visit (INDEPENDENT_AMBULATORY_CARE_PROVIDER_SITE_OTHER)

## 2024-02-14 ENCOUNTER — Encounter: Payer: Self-pay | Admitting: Podiatry

## 2024-02-14 ENCOUNTER — Ambulatory Visit: Attending: Family Medicine | Admitting: Pharmacist

## 2024-02-14 DIAGNOSIS — M779 Enthesopathy, unspecified: Secondary | ICD-10-CM

## 2024-02-14 DIAGNOSIS — S86012A Strain of left Achilles tendon, initial encounter: Secondary | ICD-10-CM

## 2024-02-14 DIAGNOSIS — Z7985 Long-term (current) use of injectable non-insulin antidiabetic drugs: Secondary | ICD-10-CM

## 2024-02-14 DIAGNOSIS — E1142 Type 2 diabetes mellitus with diabetic polyneuropathy: Secondary | ICD-10-CM

## 2024-02-14 NOTE — Progress Notes (Signed)
 I connected with  Ruth [name] on 02/14/24 by a video enabled telemedicine application and verified that I am speaking with the correct person using two identifiers.   I discussed the limitations of evaluation and management by telemedicine. The patient expressed understanding and agreed to proceed.  Location of patient: home   Location of myself: clinic   Persons participating in the call: myself and the patient   S:     No chief complaint on file.  48 y.o. female who presents for diabetes evaluation, education, and management. PMH is significant for T2DM and HLD.   I have seen her consistently since 2021. Most recently, patient was referred by Primary Care Provider, Dr. Tanda, on 01/12/2024. Underwent successful Right halgunds resection with secondary achilles tendon repair  01/21/2024. A1c pre-op was 5.8%.   When she was last seen by pharmacy on 01/03/2024, we continued her on Mounjaro  10 mg weekly. I gave her hold parameters for her surgery. She help Mounjaro  for ~2 weeks prior to surgery and resumed after. We also changed her atorvastatin  to rosuvastatin .  Today, patient is in good spirits. She reports that she is tolerating the Mounjaro  10 mg okay. She denies vomiting, abdominal pain. She endorses some sour stomach and belching but is tolerating this okay. She denies s/sx of hyper or hypoglycemia. She is using her Dexcom sensor which continues to show excellent glycemic control.   Family/Social History:  DM- daughter, brother, sister  No family or personal history of thyroid  cancer (mother has history of hyperthyroidism requiring radiation).   Current diabetes medications include: Mounjaro  10 mg weekly  Patient reports adherence to taking all medications as prescribed.  Current hyperlipidemia medications: rosuvastatin  20 mg daily  Insurance coverage: Medicaid   Patient denies hypoglycemic events.  Patient denies nocturia (nighttime urination).  Patient reports  neuropathy (nerve pain). Patient denies visual changes. Patient reports self foot exams.   Dietary habits: 2 meals a day -Has increased her water  intake since last visit. Still will drink regular soda occasionally. Unable to provide estimate of water  intake.  -snack: sandwiches, tacos  Exercise:  - None S/p surgery last month.   O:  BP Readings from Last 3 Encounters:  02/09/24 122/84  01/26/24 (!) 142/90  01/21/24 124/68    Lab Results  Component Value Date   HGBA1C 5.8 (H) 01/13/2024   There were no vitals filed for this visit.  Lipid Panel     Component Value Date/Time   CHOL 213 (H) 05/14/2023 1048   TRIG 93 05/14/2023 1048   HDL 43 05/14/2023 1048   CHOLHDL 5.0 (H) 05/14/2023 1048   LDLCALC 153 (H) 05/14/2023 1048    Clinical Atherosclerotic Cardiovascular Disease (ASCVD): No  The 10-year ASCVD risk score (Arnett DK, et al., 2019) is: 10.9%   Values used to calculate the score:     Age: 16 years     Clincally relevant sex: Female     Is Non-Hispanic African American: Yes     Diabetic: Yes     Tobacco smoker: Yes     Systolic Blood Pressure: 122 mmHg     Is BP treated: No     HDL Cholesterol: 43 mg/dL     Total Cholesterol: 213 mg/dL   Patient is participating in a Managed Medicaid Plan:  Yes   A/P: Type 2 Diabetes longstanding currently controlled based on last A1c of 5.8%. Patient is tolerating Mounjaro  10 mg okay. She has some dyspepsia and eructation since resuming after surgery  but is able to tolerate. I recommend to continue Mounjaro  10mg  weekly for now. As long as she is tolerating, we can increase to the 12.5 mg weekly dose in the future. She is amenable to this.  -Continue Mounjaro  10 mg weekly  -Continue omeprazole  40 mg daily as needed. -Patient educated on purpose, proper use, and potential adverse effects of medications.  -Extensively discussed pathophysiology of diabetes, recommended lifestyle interventions, dietary effects on blood sugar  control.  -Counseled on s/sx of and management of hypoglycemia.  -Next A1c anticipated 05/2024.   Hyperlipidemia/ASCVD Risk Reduction: - Currently uncontrolled with last LDL-C of 153 mg/dL, above goal < 70 mg/dL. High intensity statin indicated. Will continue rosuvastatin  20 mg daily. No muscle pains or fatigue since changing from atorvastatin . - Reviewed long term complications of uncontrolled cholesterol - Continue rosuvastatin  20 mg daily. Will plan on lipid panel when I see her next.  Written patient instructions provided. Patient verbalized understanding of treatment plan.  Total time in face to face counseling 30 minutes.    Follow-up:  Pharmacist: 6 weeks in-person  Herlene Fleeta Morris, PharmD, [place], CPP Clinical Pharmacist [facility] [phone]

## 2024-02-15 ENCOUNTER — Encounter: Payer: Self-pay | Admitting: Podiatry

## 2024-02-16 NOTE — Progress Notes (Signed)
 Chief Complaint  Patient presents with   Post-op Problem    POV # 2 DOS 01/21/24 RT HAGLUNDS RESECTION W/ 2NDARY ACHILLES REPAIR I fell getting out of the tub.  Now, my whole left ankle hurts all the way around.  It hurts to put weight on it.  It hurts to put the boot on it.  I just had surgery on January 21, 2024.  My pain level is a 10.    HPI: 48 y.o. female presenting today for above complaint  Past Medical History:  Diagnosis Date   Abnormal uterine bleeding 2023   ADHD (attention deficit hyperactivity disorder)    Anemia 2023   Anxiety    Arthritis    chronic back pain    Asthma    rarely uses inhaler   Bipolar 1 disorder (HCC)    Follows w/ PCP Dr. Raguel Blush.   Fibromyalgia    GERD (gastroesophageal reflux disease)    Headache    Hidradenitis    multiple boils, axilla and groin (As of 08/03/22, patient has a boil in her left axilla.)   HLD (hyperlipidemia)    diet controlled   Hx of migraines    occ   Hypertension    no meds   Hypothyroidism    Intentional drug overdose (HCC) 03/28/2021   Obesity    Sleep apnea    Patient states that she had a sleep study in the past and was told she did not need a CPAP.   Thrombocytosis 01/26/2020   Type 2 diabetes mellitus (HCC)    Type 2, Follows w/ PCP Dr. Raguel Blush.   Wears glasses     Past Surgical History:  Procedure Laterality Date   ACHILLES TENDON SURGERY Left 08/13/2023   Procedure: ACHILLES TENDON REPAIR;  Surgeon: Joya Stabs, DPM;  Location: [facility];  Service: Orthopedics/Podiatry;  Laterality: Left;   ANTERIOR CERVICAL DECOMP/DISCECTOMY FUSION N/A 01/16/2021   Procedure: Cervical five-six Anterior cervical decompression/discectomy/fusion;  Surgeon: Lanis Pupa, MD;  Location: [facility];  Service: Neurosurgery;  Laterality: N/A;   CALCANEAL OSTEOTOMY Right 01/21/2024   Procedure: OSTEOTOMY, CALCANEUS;  Surgeon: Joya Stabs, DPM;  Location: [facility];  Service: Orthopedics/Podiatry;  Laterality:  Right;  POSTERIOR POP BLOCK   CARPAL TUNNEL RELEASE Right    around 2009   CESAREAN SECTION     x2, 2003 & 2008   CYSTOSCOPY N/A 08/12/2022   Procedure: CYSTOSCOPY;  Surgeon: Jeralyn Crutch, MD;  Location: [facility];  Service: Gynecology;  Laterality: N/A;   DILITATION & CURRETTAGE/HYSTROSCOPY WITH NOVASURE ABLATION N/A 12/07/2017   Procedure: DILATATION & CURETTAGE/HYSTEROSCOPY WITH ATTEMPED NOVASURE ABLATION, FAILED/ENDOMETERIAL ABLATION WITH HTA SYSTEM;  Surgeon: Corene Coy, MD;  Location: [facility];  Service: Gynecology;  Laterality: N/A;   ELBOW SURGERY Left    surgery for nerve pain around 2009   ENDOMETRIAL BIOPSY  07/23/2022   negative for hyperplasia or malignancy   GANGLION CYST EXCISION Right    right arm    OSTECTOMY Left 08/13/2023   Procedure: CALCANEAL OSTECTOMY;  Surgeon: Joya Stabs, DPM;  Location: [facility];  Service: Orthopedics/Podiatry;  Laterality: Left;  POPLITEAL/SAPHENOUS BLOCK   RECTOVAGINAL FISTULA CLOSURE     in 8001,8000, 2006   ROBOTIC ASSISTED TOTAL HYSTERECTOMY Bilateral 08/12/2022   Procedure: XI ROBOTIC ASSISTED TOTAL HYSTERECTOMY WITH SALPINGECTOMY;  Surgeon: Jeralyn Crutch, MD;  Location:  SURGERY CENTER;  Service: Gynecology;  Laterality: Bilateral;   TENDON REPAIR Right 01/21/2024   Procedure: TENDON REPAIR;  Surgeon:  Joya Stabs, DPM;  Location: [facility];  Service: Orthopedics/Podiatry;  Laterality: Right;   TONSILLECTOMY     childhood   TUBAL LIGATION  2008   XI ROBOT ASSISTED RECTOPEXY N/A 08/12/2022   Procedure: XI robot repair serosal repair of rectum;  Surgeon: Sheldon Standing, MD;  Location: [facility];  Service: General;  Laterality: N/A;    Allergies  Allergen Reactions   Metformin  And Related Diarrhea   Penicillins Other (See Comments)    Unknown; from childhood Has patient had a PCN reaction causing immediate rash, facial/tongue/throat swelling, SOB or lightheadedness  with hypotension: Unknow Has patient had a PCN reaction causing severe rash involving mucus membranes or skin necrosis: Unknown Has patient had a PCN reaction that required hospitalization: Unknown Has patient had a PCN reaction occurring within the last 10 years: Unknown If all of the above answers are NO, then may proceed with Cephalosporin use.    Victoza [Liraglutide] Itching     Physical Exam: General: The patient is alert and oriented x3 in no acute distress.  Dermatology: Skin is warm, dry and supple bilateral lower extremities.   Vascular: Palpable pedal pulses bilaterally. Capillary refill within normal limits.  No appreciable edema.  No erythema.  Neurological: Grossly intact via light touch  Musculoskeletal Exam: Significant pain and tenderness throughout palpation through the posterior Achilles.  Loss of ankle joint plantarflexion against light resistance.  Palpable dell also noted concerning for rerupture of the Achilles  Radiographic Exam LT ankle 02/14/2024 No acute fracture identified.  There is some bony fragmentation within the mid substance of the Achilles tendon approximately 4 cm proximal to the insertion.  Concerning for possible rupture and retraction of the Achilles  Assessment/Plan of Care: 1. DOS 01/21/24 RT HAGLUNDS RESECTION W/ 2NDARY ACHILLES REPAIR Dr. Sikora 2.  Suspected rerupture of the Achilles tendon RT secondary to fall injury  -Patient evaluated.  X-rays reviewed -Continue nonweightbearing in the cam boot -Order placed for MRI LT ankle to rule out rerupture of the Achilles which I suspect she has -Follow-up with Dr. Joya after MRI is complete to review the results and discuss further options which may include revision of the Achilles rupture       Thresa EMERSON Sar, DPM [facility]  Dr. Thresa EMERSON Sar, DPM    [address] [place], [place] [address]                Office [phone]   Fax [phone]

## 2024-02-17 ENCOUNTER — Ambulatory Visit
Admission: RE | Admit: 2024-02-17 | Discharge: 2024-02-17 | Disposition: A | Discharge status: Home / Self Care | Source: Ambulatory Visit | Attending: Podiatry | Admitting: Podiatry

## 2024-02-17 DIAGNOSIS — S86012A Strain of left Achilles tendon, initial encounter: Secondary | ICD-10-CM

## 2024-02-21 ENCOUNTER — Telehealth: Payer: Self-pay | Admitting: Podiatry

## 2024-02-21 ENCOUNTER — Encounter: Payer: Self-pay | Admitting: Podiatry

## 2024-02-21 ENCOUNTER — Other Ambulatory Visit: Payer: Self-pay

## 2024-02-21 ENCOUNTER — Ambulatory Visit (INDEPENDENT_AMBULATORY_CARE_PROVIDER_SITE_OTHER): Admitting: Podiatry

## 2024-02-21 DIAGNOSIS — S86011D Strain of right Achilles tendon, subsequent encounter: Secondary | ICD-10-CM

## 2024-02-21 DIAGNOSIS — S86012A Strain of left Achilles tendon, initial encounter: Secondary | ICD-10-CM

## 2024-02-21 MED ORDER — IBUPROFEN 600 MG PO TABS
600.0000 mg | ORAL_TABLET | Freq: Three times a day (TID) | ORAL | 0 refills | Status: DC | PRN
  Filled 2024-02-21: qty 30, 10d supply, fill #0

## 2024-02-21 MED ORDER — OXYCODONE-ACETAMINOPHEN 5-325 MG PO TABS
1.0000 | ORAL_TABLET | Freq: Four times a day (QID) | ORAL | 0 refills | Status: DC | PRN
  Filled 2024-02-21: qty 30, 4d supply, fill #0

## 2024-02-21 NOTE — Progress Notes (Signed)
 Subjective:  Patient ID: Ruth [name], female    DOB: Apr 18, 1976,  MRN: [id number]  Chief Complaint  Patient presents with   Foot Pain    Read my MRI results.    DOS: 01/21/24  Procedure: Right halgunds resection with secondary achilles tendon repair  48 y.o. female returns for POV#4.  She was seen 8/11 by Dr. Janit after concern for falling out of her tub. Relates sever pain and had MRI ordered. Here to review results of MRI.   Review of Systems: Negative except as noted in the HPI. Denies N/V/F/Ch.  Past Medical History:  Diagnosis Date   Abnormal uterine bleeding 2023   ADHD (attention deficit hyperactivity disorder)    Anemia 2023   Anxiety    Arthritis    chronic back pain    Asthma    rarely uses inhaler   Bipolar 1 disorder (HCC)    Follows w/ PCP Dr. Raguel Blush.   Fibromyalgia    GERD (gastroesophageal reflux disease)    Headache    Hidradenitis    multiple boils, axilla and groin (As of 08/03/22, patient has a boil in her left axilla.)   HLD (hyperlipidemia)    diet controlled   Hx of migraines    occ   Hypertension    no meds   Hypothyroidism    Intentional drug overdose (HCC) 03/28/2021   Obesity    Sleep apnea    Patient states that she had a sleep study in the past and was told she did not need a CPAP.   Thrombocytosis 01/26/2020   Type 2 diabetes mellitus (HCC)    Type 2, Follows w/ PCP Dr. Raguel Blush.   Wears glasses     Current Outpatient Medications:    albuterol  (PROVENTIL ) (2.5 MG/3ML) 0.083% nebulizer solution, Take 3 mLs (2.5 mg total) by nebulization every 6 (six) hours as needed for wheezing or shortness of breath., Disp: 75 mL, Rfl: 12   albuterol  (VENTOLIN  HFA) 108 (90 Base) MCG/ACT inhaler, Inhale 2 puffs into the lungs every 4 (four) hours as needed for wheezing or shortness of breath., Disp: 18 g, Rfl: 5   cetirizine  (ZYRTEC  ALLERGY) 10 MG tablet, Take 1 tablet (10 mg total) by mouth daily. (Patient taking  differently: Take 10 mg by mouth at bedtime.), Disp: 90 tablet, Rfl: 1   cromolyn (OPTICROM) 4 % ophthalmic solution, Place 1 drop into both eyes in the morning and at bedtime., Disp: , Rfl:    fluticasone  (FLONASE ) 50 MCG/ACT nasal spray, Place 1 spray into both nostrils daily. (Patient taking differently: Place 1 spray into both nostrils at bedtime.), Disp: 9.9 mL, Rfl: 2   fluticasone  (FLOVENT  HFA) 110 MCG/ACT inhaler, Inhale 2 puffs into the lungs 2 (two) times daily., Disp: 1 each, Rfl: 5   hydrOXYzine  (VISTARIL ) 25 MG capsule, Take 1 capsule (25 mg total) by mouth every 8 (eight) hours as needed for anxiety., Disp: 30 capsule, Rfl: 3   ibuprofen  (ADVIL ) 600 MG tablet, Take 1 tablet (600 mg total) by mouth every 8 (eight) hours as needed., Disp: 30 tablet, Rfl: 0   Iron , Ferrous Sulfate , 325 (65 Fe) MG TABS, Take 325 mg (1 tablet) by mouth daily. (Patient taking differently: Take 325 mg by mouth at bedtime.), Disp: 30 tablet, Rfl: 3   montelukast  (SINGULAIR ) 10 MG tablet, Take 1 tablet (10 mg total) by mouth at bedtime., Disp: 90 tablet, Rfl: 1   omeprazole  (PRILOSEC) 40 MG capsule, Take 1 capsule (40 mg  total) by mouth daily. (Patient taking differently: Take 40 mg by mouth daily as needed (indigestion/heartburn.).), Disp: 90 capsule, Rfl: 1   ondansetron  (ZOFRAN ) 4 MG tablet, Take 1 tablet (4 mg total) by mouth every 8 (eight) hours as needed for nausea or vomiting., Disp: 20 tablet, Rfl: 0   oxyCODONE -acetaminophen  (PERCOCET/ROXICET) 5-325 MG tablet, Take 1-2 tablets by mouth every 6 (six) hours as needed for severe pain (pain score 7-10)., Disp: 30 tablet, Rfl: 0   rosuvastatin  (CRESTOR ) 20 MG tablet, Take 1 tablet (20 mg total) by mouth daily. (Patient taking differently: Take 20 mg by mouth every evening.), Disp: 90 tablet, Rfl: 1   sertraline  (ZOLOFT ) 50 MG tablet, Take 1 tablet (50 mg total) by mouth daily., Disp: 90 tablet, Rfl: 0   tirzepatide  (MOUNJARO ) 10 MG/0.5ML Pen, Inject 10 mg into  the skin once a week. (Patient taking differently: Inject 10 mg into the skin every Tuesday.), Disp: 6 mL, Rfl: 1   XIIDRA 5 % SOLN, Place 1 drop into both eyes 2 (two) times daily., Disp: , Rfl:   Social History   Tobacco Use  Smoking Status Every Day   Current packs/day: 0.25   Types: Cigarettes  Smokeless Tobacco Never  Tobacco Comments   Per patient, she only smokes 2 to 3 cigarettes daily as of 08/03/22.    Allergies  Allergen Reactions   Metformin  And Related Diarrhea   Penicillins Other (See Comments)    Unknown; from childhood Has patient had a PCN reaction causing immediate rash, facial/tongue/throat swelling, SOB or lightheadedness with hypotension: Unknow Has patient had a PCN reaction causing severe rash involving mucus membranes or skin necrosis: Unknown Has patient had a PCN reaction that required hospitalization: Unknown Has patient had a PCN reaction occurring within the last 10 years: Unknown If all of the above answers are NO, then may proceed with Cephalosporin use.    Victoza [Liraglutide] Itching   Objective:   There were no vitals filed for this visit.  There is no height or weight on file to calculate BMI. Constitutional Well developed. Well nourished.  Vascular Foot warm and well perfused. Capillary refill normal to all digits.   Neurologic Normal speech. Oriented to person, place, and time. Epicritic sensation to light touch grossly present bilaterally.  Dermatologic Skin healing well without signs of infection. Skin edges well coapted without signs of infection.  Orthopedic: Tenderness to palpation noted about the surgical site.   Radiographs: Interval resection of spurring and halgund deformity noted  IMPRESSION: Postsurgical changes in the posterior calcaneus with several osseous suture anchors and significant reactive edema in the posterior calcaneus. The posttraumatic edema. Infection should be excluded on clinical basis. 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 There is thickening of the distal Achilles fibers.   Incidental peroneus quartus accessory muscle in the lateral compartment. This is likely of no clinical significance.    Assessment:   No diagnosis found.  Plan:  Patient was evaluated and treated and all questions answered.  S/p foot surgery right -Progressing as expected post-operatively. -WB Status: NWB in CAM boot  -Reviewed MRI with patient and concern for rupture of surgical site. Discuss retraction of achilles and trauma to repair site and need for surgical repair. Discussed going back in and reattaching achilles as well as flexor hallucis transfer to support repair site. Patient in agreement with plan.  -Foot redressed. -Informed surgical risk consent was reviewed and read aloud to the patient.  I reviewed  the films.  I have discussed my findings with the patient in great detail.  I have discussed all risks including but not limited to infection, stiffness, scarring, limp, disability, deformity, damage to blood vessels and nerves, numbness, poor healing, need for braces, arthritis, chronic pain, amputation, death.  All benefits and realistic expectations discussed in great detail.  I have made no promises as to the outcome.  I have provided realistic expectations.  I have offered the patient a 2nd opinion, which they have declined and assured me they preferred to proceed despite the risks. Will plan for surgery sometime next week.  Post-op: Oxycodone  5/325, ASA BID, Keflex , zofran .     No follow-ups on file.

## 2024-02-21 NOTE — H&P (View-Only) (Signed)
 Subjective:  Patient ID: Ruth [name], female    DOB: Apr 18, 1976,  MRN: [id number]  Chief Complaint  Patient presents with   Foot Pain    Read my MRI results.    DOS: 01/21/24  Procedure: Right halgunds resection with secondary achilles tendon repair  48 y.o. female returns for POV#4.  She was seen 8/11 by Dr. Janit after concern for falling out of her tub. Relates sever pain and had MRI ordered. Here to review results of MRI.   Review of Systems: Negative except as noted in the HPI. Denies N/V/F/Ch.  Past Medical History:  Diagnosis Date   Abnormal uterine bleeding 2023   ADHD (attention deficit hyperactivity disorder)    Anemia 2023   Anxiety    Arthritis    chronic back pain    Asthma    rarely uses inhaler   Bipolar 1 disorder (HCC)    Follows w/ PCP Dr. Raguel Blush.   Fibromyalgia    GERD (gastroesophageal reflux disease)    Headache    Hidradenitis    multiple boils, axilla and groin (As of 08/03/22, patient has a boil in her left axilla.)   HLD (hyperlipidemia)    diet controlled   Hx of migraines    occ   Hypertension    no meds   Hypothyroidism    Intentional drug overdose (HCC) 03/28/2021   Obesity    Sleep apnea    Patient states that she had a sleep study in the past and was told she did not need a CPAP.   Thrombocytosis 01/26/2020   Type 2 diabetes mellitus (HCC)    Type 2, Follows w/ PCP Dr. Raguel Blush.   Wears glasses     Current Outpatient Medications:    albuterol  (PROVENTIL ) (2.5 MG/3ML) 0.083% nebulizer solution, Take 3 mLs (2.5 mg total) by nebulization every 6 (six) hours as needed for wheezing or shortness of breath., Disp: 75 mL, Rfl: 12   albuterol  (VENTOLIN  HFA) 108 (90 Base) MCG/ACT inhaler, Inhale 2 puffs into the lungs every 4 (four) hours as needed for wheezing or shortness of breath., Disp: 18 g, Rfl: 5   cetirizine  (ZYRTEC  ALLERGY) 10 MG tablet, Take 1 tablet (10 mg total) by mouth daily. (Patient taking  differently: Take 10 mg by mouth at bedtime.), Disp: 90 tablet, Rfl: 1   cromolyn (OPTICROM) 4 % ophthalmic solution, Place 1 drop into both eyes in the morning and at bedtime., Disp: , Rfl:    fluticasone  (FLONASE ) 50 MCG/ACT nasal spray, Place 1 spray into both nostrils daily. (Patient taking differently: Place 1 spray into both nostrils at bedtime.), Disp: 9.9 mL, Rfl: 2   fluticasone  (FLOVENT  HFA) 110 MCG/ACT inhaler, Inhale 2 puffs into the lungs 2 (two) times daily., Disp: 1 each, Rfl: 5   hydrOXYzine  (VISTARIL ) 25 MG capsule, Take 1 capsule (25 mg total) by mouth every 8 (eight) hours as needed for anxiety., Disp: 30 capsule, Rfl: 3   ibuprofen  (ADVIL ) 600 MG tablet, Take 1 tablet (600 mg total) by mouth every 8 (eight) hours as needed., Disp: 30 tablet, Rfl: 0   Iron , Ferrous Sulfate , 325 (65 Fe) MG TABS, Take 325 mg (1 tablet) by mouth daily. (Patient taking differently: Take 325 mg by mouth at bedtime.), Disp: 30 tablet, Rfl: 3   montelukast  (SINGULAIR ) 10 MG tablet, Take 1 tablet (10 mg total) by mouth at bedtime., Disp: 90 tablet, Rfl: 1   omeprazole  (PRILOSEC) 40 MG capsule, Take 1 capsule (40 mg  total) by mouth daily. (Patient taking differently: Take 40 mg by mouth daily as needed (indigestion/heartburn.).), Disp: 90 capsule, Rfl: 1   ondansetron  (ZOFRAN ) 4 MG tablet, Take 1 tablet (4 mg total) by mouth every 8 (eight) hours as needed for nausea or vomiting., Disp: 20 tablet, Rfl: 0   oxyCODONE -acetaminophen  (PERCOCET/ROXICET) 5-325 MG tablet, Take 1-2 tablets by mouth every 6 (six) hours as needed for severe pain (pain score 7-10)., Disp: 30 tablet, Rfl: 0   rosuvastatin  (CRESTOR ) 20 MG tablet, Take 1 tablet (20 mg total) by mouth daily. (Patient taking differently: Take 20 mg by mouth every evening.), Disp: 90 tablet, Rfl: 1   sertraline  (ZOLOFT ) 50 MG tablet, Take 1 tablet (50 mg total) by mouth daily., Disp: 90 tablet, Rfl: 0   tirzepatide  (MOUNJARO ) 10 MG/0.5ML Pen, Inject 10 mg into  the skin once a week. (Patient taking differently: Inject 10 mg into the skin every Tuesday.), Disp: 6 mL, Rfl: 1   XIIDRA 5 % SOLN, Place 1 drop into both eyes 2 (two) times daily., Disp: , Rfl:   Social History   Tobacco Use  Smoking Status Every Day   Current packs/day: 0.25   Types: Cigarettes  Smokeless Tobacco Never  Tobacco Comments   Per patient, she only smokes 2 to 3 cigarettes daily as of 08/03/22.    Allergies  Allergen Reactions   Metformin  And Related Diarrhea   Penicillins Other (See Comments)    Unknown; from childhood Has patient had a PCN reaction causing immediate rash, facial/tongue/throat swelling, SOB or lightheadedness with hypotension: Unknow Has patient had a PCN reaction causing severe rash involving mucus membranes or skin necrosis: Unknown Has patient had a PCN reaction that required hospitalization: Unknown Has patient had a PCN reaction occurring within the last 10 years: Unknown If all of the above answers are NO, then may proceed with Cephalosporin use.    Victoza [Liraglutide] Itching   Objective:   There were no vitals filed for this visit.  There is no height or weight on file to calculate BMI. Constitutional Well developed. Well nourished.  Vascular Foot warm and well perfused. Capillary refill normal to all digits.   Neurologic Normal speech. Oriented to person, place, and time. Epicritic sensation to light touch grossly present bilaterally.  Dermatologic Skin healing well without signs of infection. Skin edges well coapted without signs of infection.  Orthopedic: Tenderness to palpation noted about the surgical site.   Radiographs: Interval resection of spurring and halgund deformity noted  IMPRESSION: Postsurgical changes in the posterior calcaneus with several osseous suture anchors and significant reactive edema in the posterior calcaneus. The posttraumatic edema. Infection should be excluded on clinical basis. 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 There is thickening of the distal Achilles fibers.   Incidental peroneus quartus accessory muscle in the lateral compartment. This is likely of no clinical significance.    Assessment:   No diagnosis found.  Plan:  Patient was evaluated and treated and all questions answered.  S/p foot surgery right -Progressing as expected post-operatively. -WB Status: NWB in CAM boot  -Reviewed MRI with patient and concern for rupture of surgical site. Discuss retraction of achilles and trauma to repair site and need for surgical repair. Discussed going back in and reattaching achilles as well as flexor hallucis transfer to support repair site. Patient in agreement with plan.  -Foot redressed. -Informed surgical risk consent was reviewed and read aloud to the patient.  I reviewed  the films.  I have discussed my findings with the patient in great detail.  I have discussed all risks including but not limited to infection, stiffness, scarring, limp, disability, deformity, damage to blood vessels and nerves, numbness, poor healing, need for braces, arthritis, chronic pain, amputation, death.  All benefits and realistic expectations discussed in great detail.  I have made no promises as to the outcome.  I have provided realistic expectations.  I have offered the patient a 2nd opinion, which they have declined and assured me they preferred to proceed despite the risks. Will plan for surgery sometime next week.  Post-op: Oxycodone  5/325, ASA BID, Keflex , zofran .     No follow-ups on file.

## 2024-02-21 NOTE — Telephone Encounter (Signed)
 Patient is requesting a call or a message via MyChart regarding her MRI results.

## 2024-02-22 ENCOUNTER — Other Ambulatory Visit: Payer: Self-pay

## 2024-02-22 ENCOUNTER — Telehealth: Payer: Self-pay | Admitting: Podiatry

## 2024-02-22 ENCOUNTER — Encounter: Payer: Self-pay | Admitting: Podiatry

## 2024-02-22 ENCOUNTER — Other Ambulatory Visit (HOSPITAL_COMMUNITY): Payer: Self-pay

## 2024-02-22 ENCOUNTER — Ambulatory Visit: Admitting: Physician Assistant

## 2024-02-22 ENCOUNTER — Encounter: Payer: Self-pay | Admitting: Physician Assistant

## 2024-02-22 VITALS — BP 130/89 | HR 76 | Ht 66.0 in | Wt 237.0 lb

## 2024-02-22 DIAGNOSIS — F17210 Nicotine dependence, cigarettes, uncomplicated: Secondary | ICD-10-CM

## 2024-02-22 DIAGNOSIS — R300 Dysuria: Secondary | ICD-10-CM

## 2024-02-22 DIAGNOSIS — R399 Unspecified symptoms and signs involving the genitourinary system: Secondary | ICD-10-CM

## 2024-02-22 LAB — POCT URINALYSIS DIP (CLINITEK)
Bilirubin, UA: NEGATIVE
Blood, UA: NEGATIVE
Glucose, UA: NEGATIVE mg/dL
Ketones, POC UA: NEGATIVE mg/dL
Leukocytes, UA: NEGATIVE
Nitrite, UA: NEGATIVE
POC PROTEIN,UA: 30 — AB
Spec Grav, UA: >=1.03 — AB (ref 1.010–1.025)
Urobilinogen, UA: 0.2 U/dL
pH, UA: 6 (ref 5.0–8.0)

## 2024-02-22 NOTE — Telephone Encounter (Signed)
 DOS- 02/29/2024  REPAIR OF RUPTURED ACHILLES TENDON PRIMARY OPEN, PERCUTANEOUS RT- 72349 TRANSFER OF SINGLE TENDON WITH MUSCLE REDIRECTION RT- 27691 GASTROCNEMIUS RECESS RT- 72312  [facility] EFFECTIVE DATE- 01/04/2024  PER [employer] WEBSITE, NO PRIOR AUTHS ARE REQUIRED FOR CPT CODES 72349, [id number], AND [phone]. DECISION ID# [id number]

## 2024-02-22 NOTE — Telephone Encounter (Signed)
 Pt called and was not able to get in to see her pcp again until October but needs to have this surgery 8/26 for a torn tendon. The H&P is not valid from 7/9. I called [facility] same day surgery and there protocol is has to be within 30 days.   I spoke to [place] at [facility] or same day and she called the office of the pcp as well and they were not willing to get pt in sooner.Ruth [name] was extremely helpful and pt is going to the mobile clinic today to be seen so we can get an updated H&P.    Pt also asked for a letter for her housing stating she had surgery on 7/18.  Ok per Dr Joya and letter is in [place] for pt.

## 2024-02-22 NOTE — Patient Instructions (Signed)
 VISIT SUMMARY:  Today, you came in for surgical clearance following a re-rupture of your foot after a recent surgery. You also mentioned experiencing a burning sensation after urination, which you suspect might be a yeast infection. Additionally, we reviewed your diabetes management, which is well controlled.  YOUR PLAN:  -RECURRENT FOOT TENDON RUPTURE: A recurrent foot tendon rupture means that the tendon in your foot has torn again after the initial surgery. We will provide surgical clearance for your upcoming foot surgery.   Dysuria Dysuria is pain or discomfort when you pee. The pain may be felt in your urethra, which is the part of your body that drains pee (urine) from your bladder. The pain may also be felt near your genitals, groin, or in your lower belly or back. You may have to pee often or have the sudden feeling that you need to pee. This condition can affect anyone, but it's more common in females. It can be caused by: A urinary tract infection (UTI). Kidney stones or bladder stones. Some sexually transmitted infections (STIs). Dehydration. This is when there's not enough water  in your body. Irritation and swelling in the vagina. The use of some medicines. The use of some soaps or products with a scent. Follow these instructions at home: Medicines  Take your medicines only as told. Take your antibiotics as told. Do not stop taking them even if you start to feel better. Eating and drinking Drink enough fluid to keep your pee pale yellow. Certain drinks can make the pain worse. Avoid: Drinks with caffeine in them. Tea. Alcohol. In males, alcohol may irritate the prostate. General instructions Watch your condition for any changes, such as color changes in your pee. Pee often. Do not hold your pee for a long time. If you're female, wipe from front to back after you pee or poop. Use each tissue only once when you wipe. Pee after you have sex. If you've had any tests done,  it's up to you to get your test results. Ask your health care provider, or the department doing the test, when your results will be ready. Contact a health care provider if: You have a fever or chills. You have pain in your back or sides. You throw up or feel like you may throw up. You have blood in your pee. You're not peeing as often as normal. You feel very weak. Get help right away if: You have very bad pain that doesn't get better with medicine. You're confused. You have a fast heartbeat while resting. This information is not intended to replace advice given to you by your health care provider. Make sure you discuss any questions you have with your health care provider. Document Revised: 10/27/2022 Document Reviewed: 10/27/2022 Elsevier Patient Education  2024 [employer].

## 2024-02-22 NOTE — Progress Notes (Unsigned)
 Established Patient Office Visit  Subjective   Patient ID: Ruth [name], female    DOB: 17-Aug-1975  Age: 48 y.o. MRN: [id number]  Chief Complaint  Patient presents with   Medical Clearance    Patient is having surgery on right ankle.    Discussed the use of AI scribe software for clinical note transcription with the patient, who gave verbal consent to proceed.  History of Present Illness  Ruth [name] is a 48 year old female who presents for surgical clearance following a re-rupture of her foot post-surgery.  She underwent foot surgery on July 18th and experienced a re-rupture on August 9th after a fall. She is preparing for another surgery and requires updated surgical clearance.  Her blood sugars are stable with a recent A1c of 5.8%.  She experiences a burning sensation after urination since Saturday. There is no discharge, abdominal or lower back pain, fever, chills, or nausea. She took antibiotics in the past 30 days post-surgery.   Results LABS   Hemoglobin A1c: 5.8%     Past Medical History:  Diagnosis Date   Abnormal uterine bleeding 2023   ADHD (attention deficit hyperactivity disorder)    Anemia 2023   Anxiety    Arthritis    chronic back pain    Asthma    rarely uses inhaler   Bipolar 1 disorder (HCC)    Follows w/ PCP Dr. Raguel Blush.   Fibromyalgia    GERD (gastroesophageal reflux disease)    Headache    Hidradenitis    multiple boils, axilla and groin (As of 08/03/22, patient has a boil in her left axilla.)   HLD (hyperlipidemia)    diet controlled   Hx of migraines    occ   Hypertension    no meds   Hypothyroidism    Intentional drug overdose (HCC) 03/28/2021   Obesity    Sleep apnea    Patient states that she had a sleep study in the past and was told she did not need a CPAP.   Thrombocytosis 01/26/2020   Type 2 diabetes mellitus (HCC)    Type 2, Follows w/ PCP Dr. Raguel Blush.   Wears glasses    Social  History   Socioeconomic History   Marital status: Married    Spouse name: Not on file   Number of children: Not on file   Years of education: Not on file   Highest education level: GED or equivalent  Occupational History   Not on file  Tobacco Use   Smoking status: Every Day    Current packs/day: 0.25    Types: Cigarettes   Smokeless tobacco: Never   Tobacco comments:    Per patient, she only smokes 2 to 3 cigarettes daily as of 08/03/22.  Vaping Use   Vaping status: Never Used  Substance and Sexual Activity   Alcohol use: Not Currently   Drug use: Yes    Types: Marijuana    Comment: Occasional use only   Sexual activity: Yes    Birth control/protection: Surgical    Comment: tubal ligation  Other Topics Concern   Not on file  Social History Narrative   Not on file   Social Drivers of Health   Financial Resource Strain: Low Risk  (01/03/2024)   Overall Financial Resource Strain (CARDIA)    Difficulty of Paying Living Expenses: Not very hard  Food Insecurity: No Food Insecurity (01/03/2024)   Hunger Vital Sign    Worried About Running Out  of Food in the Last Year: Never true    Ran Out of Food in the Last Year: Never true  Transportation Needs: No Transportation Needs (01/03/2024)   PRAPARE - Administrator, Civil Service (Medical): No    Lack of Transportation (Non-Medical): No  Physical Activity: Inactive (01/03/2024)   Exercise Vital Sign    Days of Exercise per Week: 3 days    Minutes of Exercise per Session: 0 min  Stress: No Stress Concern Present (01/03/2024)   [employer] of Occupational Health - Occupational Stress Questionnaire    Feeling of Stress: Not at all  Social Connections: Moderately Isolated (01/03/2024)   Social Connection and Isolation Panel    Frequency of Communication with Friends and Family: More than three times a week    Frequency of Social Gatherings with Friends and Family: Three times a week    Attends Religious Services:  Never    Active Member of Clubs or Organizations: No    Attends Banker Meetings: Not on file    Marital Status: Married  Catering manager Violence: Not At Risk (05/14/2023)   Humiliation, Afraid, Rape, and Kick questionnaire    Fear of Current or Ex-Partner: No    Emotionally Abused: No    Physically Abused: No    Sexually Abused: No   Family History  Problem Relation Age of Onset   Thyroid  disease Mother    Diabetes Daughter    Diabetes Son    Breast cancer Neg Hx    Allergies  Allergen Reactions   Metformin  And Related Diarrhea   Penicillins Other (See Comments)    Unknown; from childhood Has patient had a PCN reaction causing immediate rash, facial/tongue/throat swelling, SOB or lightheadedness with hypotension: Unknow Has patient had a PCN reaction causing severe rash involving mucus membranes or skin necrosis: Unknown Has patient had a PCN reaction that required hospitalization: Unknown Has patient had a PCN reaction occurring within the last 10 years: Unknown If all of the above answers are NO, then may proceed with Cephalosporin use.    Victoza [Liraglutide] Itching    Review of Systems  Constitutional:  Negative for chills and fever.  HENT: Negative.    Eyes: Negative.   Respiratory:  Negative for shortness of breath.   Cardiovascular:  Negative for chest pain.  Gastrointestinal:  Negative for abdominal pain, nausea and vomiting.  Genitourinary:  Positive for dysuria. Negative for frequency and hematuria.  Skin: Negative.   Neurological: Negative.   Endo/Heme/Allergies: Negative.   Psychiatric/Behavioral: Negative.        Objective:     BP 130/89 (BP Location: Left Arm, Patient Position: Sitting, Cuff Size: Large)   Pulse 76   Ht 5' 6 (1.676 m)   Wt 237 lb (107.5 kg)   LMP 07/05/2022   SpO2 100%   BMI 38.25 kg/m  BP Readings from Last 3 Encounters:  02/22/24 130/89  02/09/24 122/84  01/26/24 (!) 142/90   Wt Readings from Last 3  Encounters:  02/22/24 237 lb (107.5 kg)  01/21/24 238 lb (108 kg)  01/13/24 238 lb (108 kg)    Physical Exam Vitals and nursing note reviewed.  Constitutional:      Appearance: Normal appearance.  HENT:     Head: Normocephalic and atraumatic.     Right Ear: External ear normal.     Left Ear: External ear normal.     Nose: Nose normal.     Mouth/Throat:     Mouth:  Mucous membranes are moist.     Pharynx: Oropharynx is clear.  Eyes:     Extraocular Movements: Extraocular movements intact.     Conjunctiva/sclera: Conjunctivae normal.     Pupils: Pupils are equal, round, and reactive to light.  Cardiovascular:     Rate and Rhythm: Normal rate and regular rhythm.     Pulses: Normal pulses.     Heart sounds: Normal heart sounds.  Pulmonary:     Effort: Pulmonary effort is normal.     Breath sounds: Normal breath sounds.  Abdominal:     Tenderness: There is no abdominal tenderness. There is no right CVA tenderness or left CVA tenderness.  Musculoskeletal:        General: Normal range of motion.     Cervical back: Normal range of motion and neck supple.  Skin:    General: Skin is warm and dry.  Neurological:     General: No focal deficit present.     Mental Status: She is alert and oriented to person, place, and time.  Psychiatric:        Mood and Affect: Mood normal.        Behavior: Behavior normal.        Thought Content: Thought content normal.        Judgment: Judgment normal.        Assessment & Plan:   Problem List Items Addressed This Visit   None Visit Diagnoses       Urinary tract infection symptoms    -  Primary   Relevant Orders   POCT URINALYSIS DIP (CLINITEK) (Completed)   Urine Culture (Completed)      Assessment and Plan Recurrent foot tendon rupture Recurrent rupture post initial surgery. Surgical clearance needed for upcoming procedure. - Provide surgical clearance for upcoming foot surgery. - Paperwork completed on patient's behalf,  medically stable  Dysuria UA negative for urinary tract infection, will wait on urine culture prior to treatment.  Patient understands and agrees  Patient education given on supportive care, red flags given for prompt reevaluation    I have reviewed the patient's medical history (PMH, PSH, Social History, Family History, Medications, and allergies) , and have been updated if relevant. I spent 30 minutes reviewing chart and  face to face time with patient.    Return if symptoms worsen or fail to improve.    Kirk RAMAN Mayers, PA-C

## 2024-02-23 NOTE — Progress Notes (Signed)
Pt. Needs orders for surgery. 

## 2024-02-23 NOTE — Patient Instructions (Signed)
 SURGICAL WAITING ROOM VISITATION Patients having surgery or a procedure may have no more than 2 support people in the waiting area - these visitors may rotate in the visitor waiting room.   Due to an increase in RSV and influenza rates and associated hospitalizations, children ages 66 and under may not visit patients in [facility] hospitals. If the patient needs to stay at the hospital during part of their recovery, the visitor guidelines for inpatient rooms apply.  PRE-OP VISITATION  Pre-op nurse will coordinate an appropriate time for 1 support person to accompany the patient in pre-op.  This support person may not rotate.  This visitor will be contacted when the time is appropriate for the visitor to come back in the pre-op area.  Please refer to the [facility] website for the visitor guidelines for Inpatients (after your surgery is over and you are in a regular room).  You are not required to quarantine at this time prior to your surgery. However, you must do this: Hand Hygiene often Do NOT share personal items Notify your provider if you are in close contact with someone who has COVID or you develop fever 100.4 or greater, new onset of sneezing, cough, sore throat, shortness of breath or body aches.  If you test positive for Covid or have been in contact with anyone that has tested positive in the last 10 days please notify you surgeon.    Your procedure is scheduled on:  02/29/24  Report to [facility] Main Entrance: [place] entrance where the [employer] is available.   Report to admitting at:11:45 AM  Call this number if you have any questions or problems the morning of surgery [phone]  FOLLOW ANY ADDITIONAL PRE OP INSTRUCTIONS YOU RECEIVED FROM YOUR SURGEON'S OFFICE!!!  Do not eat food after Midnight the night prior to your surgery/procedure.  After Midnight you may have the following liquids until: 11:00 AM DAY OF SURGERY  Clear Liquid Diet Water  Black  Coffee (sugar ok, NO MILK/CREAM OR CREAMERS)  Tea (sugar ok, NO MILK/CREAM OR CREAMERS) regular and decaf                             Plain Jell-O  with no fruit (NO RED)                                           Fruit ices (not with fruit pulp, NO RED)                                     Popsicles (NO RED)                                                                  Juice: NO CITRUS JUICES: only apple, WHITE grape, WHITE cranberry Sports drinks like Gatorade or Powerade (NO RED)   Oral Hygiene is also important to reduce your risk of infection.        Remember - BRUSH YOUR TEETH THE MORNING OF SURGERY WITH YOUR REGULAR TOOTHPASTE  Do NOT smoke after Midnight the night before surgery.  STOP TAKING all Vitamins, Herbs and supplements 1 week before your surgery.   HOLD Mounjaro  after: 02/21/24  Take ONLY these medicines the morning of surgery with A SIP OF WATER : sertraline .Use eye drops and inhalers as usual and bring them.Hydroxyzine ,omeprazole  as needed.  If You have been diagnosed with Sleep Apnea - Bring CPAP mask and tubing day of surgery. We will provide you with a CPAP machine on the day of your surgery.                   You may not have any metal on your body including hair pins, jewelry, and body piercing  Do not wear make-up, lotions, powders, perfumes / cologne, or deodorant  Do not wear nail polish including gel and S&S, artificial / acrylic nails, or any other type of covering on natural nails including finger and toenails. If you have artificial nails, gel coating, etc., that needs to be removed by a nail salon, Please have this removed prior to surgery. Not doing so may mean that your surgery could be cancelled or delayed if the Surgeon or anesthesia staff feels like they are unable to monitor you safely.   Do not shave 48 hours prior to surgery to avoid nicks in your skin which may contribute to postoperative infections.   Contacts, Hearing Aids, dentures or bridgework  may not be worn into surgery. DENTURES WILL BE REMOVED PRIOR TO SURGERY PLEASE DO NOT APPLY Poly grip OR ADHESIVES!!!  You may bring a small overnight bag with you on the day of surgery, only pack items that are not valuable. [facility] IS NOT RESPONSIBLE   FOR VALUABLES THAT ARE LOST OR STOLEN.   Patients discharged on the day of surgery will not be allowed to drive home.  Someone NEEDS to stay with you for the first 24 hours after anesthesia.  Do not bring your home medications to the hospital. The Pharmacy will dispense medications listed on your medication list to you during your admission in the Hospital.  Special Instructions: Bring a copy of your healthcare power of attorney and living will documents the day of surgery, if you wish to have them scanned into your [facility] Records- EPIC  Please read over the following fact sheets you were given: IF YOU HAVE QUESTIONS ABOUT YOUR PRE-OP INSTRUCTIONS, PLEASE CALL [phone]   [facility] - Preparing for Surgery Before surgery, you can play an important role.  Because skin is not sterile, your skin needs to be as free of germs as possible.  You can reduce the number of germs on your skin by washing with CHG (chlorahexidine gluconate) soap before surgery.  CHG is an antiseptic cleaner which kills germs and bonds with the skin to continue killing germs even after washing. Please DO NOT use if you have an allergy to CHG or antibacterial soaps.  If your skin becomes reddened/irritated stop using the CHG and inform your nurse when you arrive at Short Stay. Do not shave (including legs and underarms) for at least 48 hours prior to the first CHG shower.  You may shave your face/neck.  Please follow these instructions carefully:  1.  Shower with CHG Soap the night before surgery and the  morning of surgery.  2.  If you choose to wash your hair, wash your hair first as usual with your normal  shampoo.  3.  After you shampoo, rinse your  hair and body  thoroughly to remove the shampoo.                             4.  Use CHG as you would any other liquid soap.  You can apply chg directly to the skin and wash.  Gently with a scrungie or clean washcloth.  5.  Apply the CHG Soap to your body ONLY FROM THE NECK DOWN.   Do not use on face/ open                           Wound or open sores. Avoid contact with eyes, ears mouth and genitals (private parts).                       Wash face,  Genitals (private parts) with your normal soap.             6.  Wash thoroughly, paying special attention to the area where your  surgery  will be performed.  7.  Thoroughly rinse your body with warm water  from the neck down.  8.  DO NOT shower/wash with your normal soap after using and rinsing off the CHG Soap.            9.  Pat yourself dry with a clean towel.            10.  Wear clean pajamas.            11.  Place clean sheets on your bed the night of your first shower and do not  sleep with pets.  ON THE DAY OF SURGERY : Do not apply any lotions/deodorants the morning of surgery.  Please wear clean clothes to the hospital/surgery center.     FAILURE TO FOLLOW THESE INSTRUCTIONS MAY RESULT IN THE CANCELLATION OF YOUR SURGERY  PATIENT SIGNATURE_________________________________  NURSE SIGNATURE__________________________________  ________________________________________________________________________

## 2024-02-24 ENCOUNTER — Ambulatory Visit: Payer: Self-pay | Admitting: Physician Assistant

## 2024-02-24 ENCOUNTER — Encounter (HOSPITAL_COMMUNITY)
Admission: RE | Admit: 2024-02-24 | Discharge: 2024-02-24 | Disposition: A | Discharge status: Home / Self Care | Source: Ambulatory Visit | Attending: Podiatry | Admitting: Podiatry

## 2024-02-24 ENCOUNTER — Other Ambulatory Visit: Payer: Self-pay

## 2024-02-24 ENCOUNTER — Encounter: Payer: Self-pay | Admitting: Podiatry

## 2024-02-24 ENCOUNTER — Encounter: Payer: Self-pay | Admitting: Physician Assistant

## 2024-02-24 ENCOUNTER — Encounter (HOSPITAL_COMMUNITY): Payer: Self-pay

## 2024-02-24 VITALS — BP 129/88 | HR 72 | Temp 98.0°F | Ht 66.0 in | Wt 235.0 lb

## 2024-02-24 DIAGNOSIS — Z01812 Encounter for preprocedural laboratory examination: Secondary | ICD-10-CM | POA: Diagnosis present

## 2024-02-24 DIAGNOSIS — E1142 Type 2 diabetes mellitus with diabetic polyneuropathy: Secondary | ICD-10-CM | POA: Insufficient documentation

## 2024-02-24 DIAGNOSIS — Z01818 Encounter for other preprocedural examination: Secondary | ICD-10-CM | POA: Insufficient documentation

## 2024-02-24 DIAGNOSIS — N3000 Acute cystitis without hematuria: Secondary | ICD-10-CM

## 2024-02-24 DIAGNOSIS — R9431 Abnormal electrocardiogram [ECG] [EKG]: Secondary | ICD-10-CM | POA: Insufficient documentation

## 2024-02-24 DIAGNOSIS — I10 Essential (primary) hypertension: Secondary | ICD-10-CM | POA: Insufficient documentation

## 2024-02-24 DIAGNOSIS — Z01810 Encounter for preprocedural cardiovascular examination: Secondary | ICD-10-CM | POA: Diagnosis present

## 2024-02-24 HISTORY — DX: Depression, unspecified: F32.A

## 2024-02-24 LAB — CBC
HCT: 37 % (ref 36.0–46.0)
Hemoglobin: 12.1 g/dL (ref 12.0–15.0)
MCH: 28.1 pg (ref 26.0–34.0)
MCHC: 32.7 g/dL (ref 30.0–36.0)
MCV: 85.8 fL (ref 80.0–100.0)
Platelets: 326 K/uL (ref 150–400)
RBC: 4.31 MIL/uL (ref 3.87–5.11)
RDW: 13.2 % (ref 11.5–15.5)
WBC: 4.4 K/uL (ref 4.0–10.5)
nRBC: 0 % (ref 0.0–0.2)

## 2024-02-24 LAB — BASIC METABOLIC PANEL WITH GFR
Anion gap: 9 (ref 5–15)
BUN: 12 mg/dL (ref 6–20)
CO2: 24 mmol/L (ref 22–32)
Calcium: 8.9 mg/dL (ref 8.9–10.3)
Chloride: 104 mmol/L (ref 98–111)
Creatinine, Ser: 0.69 mg/dL (ref 0.44–1.00)
GFR, Estimated: >60 mL/min (ref >60)
Glucose, Bld: 95 mg/dL (ref 70–99)
Potassium: 4.1 mmol/L (ref 3.5–5.1)
Sodium: 137 mmol/L (ref 135–145)

## 2024-02-24 LAB — URINE CULTURE

## 2024-02-24 LAB — GLUCOSE, CAPILLARY: Glucose-Capillary: 104 mg/dL — ABNORMAL HIGH (ref 70–99)

## 2024-02-24 MED ORDER — NITROFURANTOIN MONOHYD MACRO 100 MG PO CAPS
100.0000 mg | ORAL_CAPSULE | Freq: Two times a day (BID) | ORAL | 0 refills | Status: AC
  Filled 2024-02-24: qty 10, 5d supply, fill #0

## 2024-02-24 NOTE — Progress Notes (Addendum)
 For Anesthesia: PCP - Raguel Blush, MD  Cardiologist - N/A Clearance:Mayers, Cari S, PA-C  : 8?19/25 Bowel Prep reminder:  Chest x-ray -  EKG - 02/24/24 Stress Test -  ECHO -  Cardiac Cath -  Pacemaker/ICD device last checked: Pacemaker orders received: Device Rep notified:  Spinal Cord Stimulator:  Sleep Study - Yes CPAP - NO  Fasting Blood Sugar - 90's - 100's Checks Blood Sugar : continuous monitor dexcom 7 Date and result of last Hgb A1c-5.8: 01/13/24  Last dose of GLP1 agonist- Mounjaro  GLP1 instructions: Last dose: 02/15/24  Last dose of SGLT-2 inhibitors- N/A SGLT-2 instructions:   Blood Thinner Instructions: Aspirin  Instructions:to continue it. Last Dose:  Activity level:    Unable to go up a flight of stairs due to achilles injure.   Anesthesia review: Hx: smoker,HTN,OSA(NO CPAP),DIA 2  Patient denies shortness of breath, fever, cough and chest pain at PAT appointment   Patient verbalized understanding of instructions that were reviewed over the telephone.

## 2024-02-29 ENCOUNTER — Ambulatory Visit (HOSPITAL_BASED_OUTPATIENT_CLINIC_OR_DEPARTMENT_OTHER): Payer: Self-pay | Admitting: Anesthesiology

## 2024-02-29 ENCOUNTER — Encounter (HOSPITAL_COMMUNITY)
Admission: RE | Disposition: A | Discharge status: Home / Self Care | Payer: Self-pay | Source: Home / Self Care | Attending: Podiatry

## 2024-02-29 ENCOUNTER — Ambulatory Visit (HOSPITAL_COMMUNITY): Payer: Self-pay | Admitting: Medical

## 2024-02-29 ENCOUNTER — Encounter (HOSPITAL_COMMUNITY): Payer: Self-pay | Admitting: Podiatry

## 2024-02-29 ENCOUNTER — Ambulatory Visit (HOSPITAL_COMMUNITY)
Admission: RE | Admit: 2024-02-29 | Discharge: 2024-02-29 | Disposition: A | Discharge status: Home / Self Care | Attending: Podiatry | Admitting: Podiatry

## 2024-02-29 ENCOUNTER — Other Ambulatory Visit: Payer: Self-pay

## 2024-02-29 DIAGNOSIS — F319 Bipolar disorder, unspecified: Secondary | ICD-10-CM | POA: Insufficient documentation

## 2024-02-29 DIAGNOSIS — Z79899 Other long term (current) drug therapy: Secondary | ICD-10-CM | POA: Insufficient documentation

## 2024-02-29 DIAGNOSIS — F418 Other specified anxiety disorders: Secondary | ICD-10-CM | POA: Insufficient documentation

## 2024-02-29 DIAGNOSIS — S86011A Strain of right Achilles tendon, initial encounter: Secondary | ICD-10-CM | POA: Insufficient documentation

## 2024-02-29 DIAGNOSIS — J45909 Unspecified asthma, uncomplicated: Secondary | ICD-10-CM | POA: Diagnosis not present

## 2024-02-29 DIAGNOSIS — W19XXXA Unspecified fall, initial encounter: Secondary | ICD-10-CM | POA: Diagnosis not present

## 2024-02-29 DIAGNOSIS — E119 Type 2 diabetes mellitus without complications: Secondary | ICD-10-CM | POA: Diagnosis not present

## 2024-02-29 DIAGNOSIS — M797 Fibromyalgia: Secondary | ICD-10-CM | POA: Insufficient documentation

## 2024-02-29 DIAGNOSIS — I10 Essential (primary) hypertension: Secondary | ICD-10-CM | POA: Insufficient documentation

## 2024-02-29 DIAGNOSIS — Z7985 Long-term (current) use of injectable non-insulin antidiabetic drugs: Secondary | ICD-10-CM | POA: Diagnosis not present

## 2024-02-29 DIAGNOSIS — E039 Hypothyroidism, unspecified: Secondary | ICD-10-CM | POA: Diagnosis not present

## 2024-02-29 DIAGNOSIS — F17210 Nicotine dependence, cigarettes, uncomplicated: Secondary | ICD-10-CM | POA: Insufficient documentation

## 2024-02-29 DIAGNOSIS — S86011D Strain of right Achilles tendon, subsequent encounter: Secondary | ICD-10-CM | POA: Diagnosis present

## 2024-02-29 DIAGNOSIS — M1990 Unspecified osteoarthritis, unspecified site: Secondary | ICD-10-CM | POA: Insufficient documentation

## 2024-02-29 DIAGNOSIS — K219 Gastro-esophageal reflux disease without esophagitis: Secondary | ICD-10-CM | POA: Insufficient documentation

## 2024-02-29 HISTORY — PX: GASTROC RECESSION EXTREMITY: SHX6262

## 2024-02-29 HISTORY — PX: ACHILLES TENDON SURGERY: SHX542

## 2024-02-29 HISTORY — PX: TENDON TRANSFER: SHX6109

## 2024-02-29 LAB — GLUCOSE, CAPILLARY
Glucose-Capillary: 103 mg/dL — ABNORMAL HIGH (ref 70–99)
Glucose-Capillary: 103 mg/dL — ABNORMAL HIGH (ref 70–99)
Glucose-Capillary: 131 mg/dL — ABNORMAL HIGH (ref 70–99)

## 2024-02-29 SURGERY — REPAIR, TENDON, ACHILLES
Anesthesia: General | Laterality: Right

## 2024-02-29 MED ORDER — BUPIVACAINE-EPINEPHRINE (PF) 0.5% -1:200000 IJ SOLN
INTRAMUSCULAR | Status: AC
  Filled 2024-02-29: qty 30

## 2024-02-29 MED ORDER — OXYCODONE-ACETAMINOPHEN 5-325 MG PO TABS
1.0000 | ORAL_TABLET | Freq: Four times a day (QID) | ORAL | 0 refills | Status: DC | PRN
  Filled 2024-02-29: qty 30, 4d supply, fill #0

## 2024-02-29 MED ORDER — LIDOCAINE HCL 1 % IJ SOLN
INTRAMUSCULAR | Status: AC
  Filled 2024-02-29: qty 20

## 2024-02-29 MED ORDER — PROPOFOL 10 MG/ML IV BOLUS
INTRAVENOUS | Status: AC
  Filled 2024-02-29: qty 20

## 2024-02-29 MED ORDER — MIDAZOLAM HCL 2 MG/2ML IJ SOLN
INTRAMUSCULAR | Status: AC
  Filled 2024-02-29: qty 2

## 2024-02-29 MED ORDER — ASPIRIN 81 MG PO TBEC
81.0000 mg | DELAYED_RELEASE_TABLET | Freq: Two times a day (BID) | ORAL | 0 refills | Status: AC
  Filled 2024-02-29: qty 80, 40d supply, fill #0

## 2024-02-29 MED ORDER — CHLORHEXIDINE GLUCONATE 0.12 % MT SOLN
15.0000 mL | Freq: Once | OROMUCOSAL | Status: AC
  Administered 2024-02-29: 15 mL via OROMUCOSAL

## 2024-02-29 MED ORDER — LACTATED RINGERS IV SOLN: INTRAVENOUS | Status: DC

## 2024-02-29 MED ORDER — OXYCODONE HCL 5 MG PO TABS
5.0000 mg | ORAL_TABLET | Freq: Once | ORAL | Status: AC | PRN
  Administered 2024-02-29: 5 mg via ORAL

## 2024-02-29 MED ORDER — OXYCODONE HCL 5 MG PO TABS
ORAL_TABLET | ORAL | Status: AC
  Filled 2024-02-29: qty 1

## 2024-02-29 MED ORDER — DEXAMETHASONE SODIUM PHOSPHATE 10 MG/ML IJ SOLN
INTRAMUSCULAR | Status: DC | PRN
  Administered 2024-02-29: 10 mg via INTRAVENOUS

## 2024-02-29 MED ORDER — MIDAZOLAM HCL 5 MG/5ML IJ SOLN
INTRAMUSCULAR | Status: DC | PRN
  Administered 2024-02-29: 2 mg via INTRAVENOUS

## 2024-02-29 MED ORDER — LIDOCAINE HCL (PF) 2 % IJ SOLN
INTRAMUSCULAR | Status: AC
  Filled 2024-02-29: qty 5

## 2024-02-29 MED ORDER — FENTANYL CITRATE (PF) 100 MCG/2ML IJ SOLN
INTRAMUSCULAR | Status: AC
  Filled 2024-02-29: qty 2

## 2024-02-29 MED ORDER — SUGAMMADEX SODIUM 200 MG/2ML IV SOLN
INTRAVENOUS | Status: DC | PRN
  Administered 2024-02-29: 200 mg via INTRAVENOUS

## 2024-02-29 MED ORDER — INSULIN ASPART 100 UNIT/ML IJ SOLN: 0.0000 [IU] | INTRAMUSCULAR | Status: DC | PRN

## 2024-02-29 MED ORDER — CLONIDINE HCL (ANALGESIA) 100 MCG/ML EP SOLN
EPIDURAL | Status: DC | PRN
  Administered 2024-02-29: 33 ug
  Administered 2024-02-29: 67 ug

## 2024-02-29 MED ORDER — ACETAMINOPHEN 500 MG PO TABS
1000.0000 mg | ORAL_TABLET | Freq: Once | ORAL | Status: AC
  Administered 2024-02-29: 1000 mg via ORAL
  Filled 2024-02-29: qty 2

## 2024-02-29 MED ORDER — FENTANYL CITRATE (PF) 100 MCG/2ML IJ SOLN
INTRAMUSCULAR | Status: DC | PRN
  Administered 2024-02-29: 100 ug via INTRAVENOUS
  Administered 2024-02-29 (×2): 50 ug via INTRAVENOUS

## 2024-02-29 MED ORDER — DROPERIDOL 2.5 MG/ML IJ SOLN: 0.6250 mg | Freq: Once | INTRAMUSCULAR | Status: DC | PRN

## 2024-02-29 MED ORDER — CHLORHEXIDINE GLUCONATE CLOTH 2 % EX PADS: 6.0000 | MEDICATED_PAD | Freq: Once | CUTANEOUS | Status: DC

## 2024-02-29 MED ORDER — OXYCODONE HCL 5 MG/5ML PO SOLN: 5.0000 mg | Freq: Once | ORAL | Status: AC | PRN

## 2024-02-29 MED ORDER — CLINDAMYCIN HCL 300 MG PO CAPS
300.0000 mg | ORAL_CAPSULE | Freq: Three times a day (TID) | ORAL | 2 refills | Status: AC
Start: 2024-02-29 — End: ?
  Filled 2024-02-29: qty 30, 10d supply, fill #0

## 2024-02-29 MED ORDER — ROCURONIUM BROMIDE 100 MG/10ML IV SOLN
INTRAVENOUS | Status: DC | PRN
  Administered 2024-02-29: 60 mg via INTRAVENOUS

## 2024-02-29 MED ORDER — FENTANYL CITRATE PF 50 MCG/ML IJ SOSY
50.0000 ug | PREFILLED_SYRINGE | INTRAMUSCULAR | Status: DC
  Administered 2024-02-29: 100 ug via INTRAVENOUS
  Filled 2024-02-29: qty 2

## 2024-02-29 MED ORDER — MIDAZOLAM HCL 2 MG/2ML IJ SOLN
1.0000 mg | INTRAMUSCULAR | Status: DC
  Administered 2024-02-29: 2 mg via INTRAVENOUS
  Filled 2024-02-29: qty 2

## 2024-02-29 MED ORDER — FENTANYL CITRATE PF 50 MCG/ML IJ SOSY: 25.0000 ug | PREFILLED_SYRINGE | INTRAMUSCULAR | Status: DC | PRN

## 2024-02-29 MED ORDER — ONDANSETRON HCL 4 MG/2ML IJ SOLN
INTRAMUSCULAR | Status: DC | PRN
  Administered 2024-02-29: 4 mg via INTRAVENOUS

## 2024-02-29 MED ORDER — ONDANSETRON HCL 4 MG PO TABS
4.0000 mg | ORAL_TABLET | Freq: Three times a day (TID) | ORAL | 0 refills | Status: DC | PRN
  Filled 2024-02-29: qty 20, 7d supply, fill #0

## 2024-02-29 MED ORDER — CLINDAMYCIN PHOSPHATE 900 MG/50ML IV SOLN
900.0000 mg | INTRAVENOUS | Status: AC
  Administered 2024-02-29: 900 mg via INTRAVENOUS
  Filled 2024-02-29: qty 50

## 2024-02-29 MED ORDER — PROPOFOL 10 MG/ML IV BOLUS
INTRAVENOUS | Status: DC | PRN
  Administered 2024-02-29: 200 mg via INTRAVENOUS

## 2024-02-29 MED ORDER — LIDOCAINE HCL (CARDIAC) PF 100 MG/5ML IV SOSY
PREFILLED_SYRINGE | INTRAVENOUS | Status: DC | PRN
  Administered 2024-02-29: 100 mg via INTRAVENOUS

## 2024-02-29 MED ORDER — ORAL CARE MOUTH RINSE: 15.0000 mL | Freq: Once | OROMUCOSAL | Status: AC

## 2024-02-29 MED ORDER — BUPIVACAINE-EPINEPHRINE (PF) 0.5% -1:200000 IJ SOLN
INTRAMUSCULAR | Status: DC | PRN
  Administered 2024-02-29: 20 mL via PERINEURAL
  Administered 2024-02-29: 10 mL via PERINEURAL

## 2024-02-29 SURGICAL SUPPLY — 71 items
ANCH SWIVELOCK KTLS 4.75 (Anchor) IMPLANT
ANCHOR SYS DX KL 4.75 (Anchor) IMPLANT
BAG COUNTER SPONGE SURGICOUNT (BAG) IMPLANT
BAG ZIPLOCK 12X15 (MISCELLANEOUS) ×1 IMPLANT
BLADE AVERAGE 25X9 (BLADE) IMPLANT
BLADE SURG 15 STRL LF DISP TIS (BLADE) ×1 IMPLANT
BLADE SURG SZ10 CARB STEEL (BLADE) ×1 IMPLANT
BNDG COHESIVE 3X5 TAN ST LF (GAUZE/BANDAGES/DRESSINGS) ×1 IMPLANT
BNDG COHESIVE 6X5 TAN ST LF (GAUZE/BANDAGES/DRESSINGS) IMPLANT
BNDG COMPR ESMARK 4X3 LF (GAUZE/BANDAGES/DRESSINGS) ×1 IMPLANT
BNDG ELASTIC 4INX 5YD STR LF (GAUZE/BANDAGES/DRESSINGS) IMPLANT
BNDG GAUZE DERMACEA FLUFF 4 (GAUZE/BANDAGES/DRESSINGS) IMPLANT
BNDG STRETCH GAUZE 3IN X12FT (GAUZE/BANDAGES/DRESSINGS) ×1 IMPLANT
CLOTH BEACON ORANGE TIMEOUT ST (SAFETY) ×1 IMPLANT
COVER BACK TABLE 60X90IN (DRAPES) ×1 IMPLANT
COVER SURGICAL LIGHT HANDLE (MISCELLANEOUS) ×1 IMPLANT
DRAPE EXTREMITY T 121X128X90 (DISPOSABLE) ×1 IMPLANT
DRAPE SHEET LG 3/4 BI-LAMINATE (DRAPES) ×1 IMPLANT
DRSG ADAPTIC 3X8 NADH LF (GAUZE/BANDAGES/DRESSINGS) IMPLANT
DURAPREP 26ML APPLICATOR (WOUND CARE) ×1 IMPLANT
ELECT REM PT RETURN 15FT ADLT (MISCELLANEOUS) ×1 IMPLANT
ELECTRODE REM PT RTRN 9FT ADLT (ELECTROSURGICAL) ×1 IMPLANT
GAUZE 4X4 16PLY ~~LOC~~+RFID DBL (SPONGE) ×1 IMPLANT
GAUZE PAD ABD 8X10 STRL (GAUZE/BANDAGES/DRESSINGS) ×1 IMPLANT
GAUZE SPONGE 4X4 12PLY STRL (GAUZE/BANDAGES/DRESSINGS) ×1 IMPLANT
GAUZE XEROFORM 1X8 LF (GAUZE/BANDAGES/DRESSINGS) ×1 IMPLANT
GLOVE BIO SURGEON STRL SZ7.5 (GLOVE) ×1 IMPLANT
GLOVE BIOGEL PI IND STRL 7.5 (GLOVE) ×1 IMPLANT
GLOVE SURG SS PI 8.0 STRL IVOR (GLOVE) ×1 IMPLANT
GOWN STRL REUS W/ TWL LRG LVL3 (GOWN DISPOSABLE) ×1 IMPLANT
GOWN STRL REUS W/TWL LRG LVL3 (GOWN DISPOSABLE) ×1 IMPLANT
KIT BASIN OR (CUSTOM PROCEDURE TRAY) ×1 IMPLANT
KIT SIZER GRAFT TENODESIS SUTR (KITS) IMPLANT
KIT TURNOVER CYSTO (KITS) ×1 IMPLANT
KIT TURNOVER KIT A (KITS) ×1 IMPLANT
MANIFOLD NEPTUNE II (INSTRUMENTS) IMPLANT
NDL HYPO 25X1 1.5 SAFETY (NEEDLE) IMPLANT
NEEDLE HYPO 25X1 1.5 SAFETY (NEEDLE) IMPLANT
NS IRRIG 1000ML POUR BTL (IV SOLUTION) ×1 IMPLANT
NS IRRIG 500ML POUR BTL (IV SOLUTION) ×1 IMPLANT
PACK BASIN DAY SURGERY FS (CUSTOM PROCEDURE TRAY) ×1 IMPLANT
PAD CAST 4YDX4 CTTN HI CHSV (CAST SUPPLIES) ×1 IMPLANT
PADDING CAST ABS COTTON 4X4 ST (CAST SUPPLIES) ×1 IMPLANT
PADDING CAST COTTON 6X4 STRL (CAST SUPPLIES) ×1 IMPLANT
PASSER SUT SWANSON 36MM LOOP (INSTRUMENTS) ×1 IMPLANT
PENCIL SMOKE EVACUATOR (MISCELLANEOUS) ×1 IMPLANT
PROTECTOR NERVE ULNAR (MISCELLANEOUS) ×1 IMPLANT
SLEEVE SCD COMPRESS KNEE MED (STOCKING) ×1 IMPLANT
SPONGE T-LAP 4X18 ~~LOC~~+RFID (SPONGE) ×1 IMPLANT
STAPLER VISISTAT (STAPLE) ×1 IMPLANT
STOCKINETTE 4X48 STRL (DRAPES) ×1 IMPLANT
SUCTION TUBE FRAZIER 10FR DISP (SUCTIONS) IMPLANT
SUT ETHILON 4 0 PS 2 18 (SUTURE) ×1 IMPLANT
SUT MNCRL AB 4-0 PS2 18 (SUTURE) IMPLANT
SUT VIC AB 0 CT1 36 (SUTURE) ×1 IMPLANT
SUT VIC AB 0 CT2 27 (SUTURE) ×1 IMPLANT
SUT VIC AB 2-0 CT1 TAPERPNT 27 (SUTURE) ×2 IMPLANT
SUT VIC AB 3-0 SH 27X BRD (SUTURE) IMPLANT
SUT VIC AB 4-0 PS2 18 (SUTURE) ×1 IMPLANT
SUTURE FIBERWR #2 38 T-5 BLUE (SUTURE) ×2 IMPLANT
SYR BULB EAR ULCER 3OZ GRN STR (SYRINGE) ×1 IMPLANT
SYR BULB IRRIG 60ML STRL (SYRINGE) ×1 IMPLANT
SYR CONTROL 10ML LL (SYRINGE) ×1 IMPLANT
SYSTEM IMPLANT FHL 6.25 (Anchor) IMPLANT
TAPE STRIPS DRAPE STRL (GAUZE/BANDAGES/DRESSINGS) ×1 IMPLANT
TOWEL OR 17X24 6PK STRL BLUE (TOWEL DISPOSABLE) ×2 IMPLANT
TOWEL OR 17X26 10 PK STRL BLUE (TOWEL DISPOSABLE) ×2 IMPLANT
TUBE CONNECTING 12X1/4 (SUCTIONS) IMPLANT
UNDERPAD 30X36 HEAVY ABSORB (UNDERPADS AND DIAPERS) ×1 IMPLANT
WATER STERILE IRR 1000ML POUR (IV SOLUTION) ×1 IMPLANT
WATER STERILE IRR 500ML POUR (IV SOLUTION) ×1 IMPLANT

## 2024-02-29 NOTE — Brief Op Note (Signed)
 02/29/2024  1:52 PM  PATIENT:  Ruth [name]  48 y.o. female  PRE-OPERATIVE DIAGNOSIS:  achilles tendon rupture right  POST-OPERATIVE DIAGNOSIS:  achilles tendon rupture right  PROCEDURE:  Procedure(s) with comments: REPAIR, TENDON, ACHILLES (Right) TRANSFER, TENDON (Right) - with muscle redirection RECESSION, TENDON, GASTROCNEMIUS (Right)  SURGEON:  Surgeons and Role:    * Joya Stabs, DPM - Primary  PHYSICIAN ASSISTANT:   ASSISTANTS: none   ANESTHESIA:   general and pop/saph block   EBL:  Minimal   BLOOD ADMINISTERED:none  DRAINS: none   LOCAL MEDICATIONS USED:  NONE  SPECIMEN:  No Specimen  DISPOSITION OF SPECIMEN:  N/A  COUNTS:  YES  TOURNIQUET:  * Missing tourniquet times found for documented tourniquets in log: [id number] *  DICTATION: .Note written in EPIC  PLAN OF CARE: Discharge to home after PACU  PATIENT DISPOSITION:  PACU - hemodynamically stable.   Delay start of Pharmacological VTE agent (>24hrs) due to surgical blood loss or risk of bleeding: not applicable

## 2024-02-29 NOTE — Anesthesia Procedure Notes (Signed)
 Anesthesia Regional Block: Adductor canal block   Pre-Anesthetic Checklist: , timeout performed,  Correct Patient, Correct Site, Correct Laterality,  Correct Procedure, Correct Position, site marked,  Risks and benefits discussed,  Pre-op evaluation,  At surgeon's request and post-op pain management  Laterality: Right  Prep: Maximum Sterile Barrier Precautions used, chloraprep       Needles:  Injection technique: Single-shot  Needle Type: Echogenic Stimulator Needle     Needle Length: 9cm  Needle Gauge: 22     Additional Needles:   Procedures:,,,, ultrasound used (permanent image in chart),,    Narrative:  Start time: 02/29/2024 1:28 PM End time: 02/29/2024 1:31 PM Injection made incrementally with aspirations every 5 mL.  Performed by: Personally  Anesthesiologist: Paul Lamarr BRAVO, MD  Additional Notes: Risks, benefits, and alternative discussed. Patient gave consent for procedure. Patient prepped and draped in sterile fashion. Sedation administered, patient remains easily responsive to voice. Relevant anatomy identified with ultrasound guidance. Local anesthetic given in 5cc increments with no signs or symptoms of intravascular injection. No pain or paraesthesias with injection. Patient monitored throughout procedure with signs of LAST or immediate complications. Tolerated well. Ultrasound image placed in chart.  LANEY Paul, MD

## 2024-02-29 NOTE — Op Note (Signed)
 OPERATIVE REPORT Patient name: Ruth [name] MRN: [id number] DOB: 07/02/1976  DOS:  02/29/24  Preop Dx: Achilles tendon rupture, right, subsequent encounter - Plan: Diet Carb Modified, PR PNEUMAT WALKING BOOT PRE CST Postop Dx: same  Procedure:  1. Right achilles tendon rupture repair  2. Right flexor hallucis longus transfer  3. V-Y lengthening of Achilles tendon right  Surgeon: [name], DPM  Anesthesia: Pop-saph block pre-operatively   Hemostasis: Thigh TQ at 300 mmHg  EBL: <10 mL Materials: Arthrex achilles speed bridge system and FHL transfer kit Injectables: n/a Pathology: n/a  Condition: The patient tolerated the procedure and anesthesia well. No complications noted or reported   Justification for procedure: The patient is a 48 y.o. Female who presents today for surgical correction of achilles tendon rupture. All conservative modalities of been unsuccessful in providing any sort of satisfactory alleviation of symptoms with the patient. The patient was told benefits as well as possible side effects of the surgery. The patient consented for surgical correction. The patient consent form was reviewed. All patient questions were answered. No guarantees were expressed or implied. The patient and the surgeon both signed the patient consent form with the witness present and placed in the patient's chart.   Procedure in Detail: The patient was brought to the operating room, placed in the operating table in the prone position at which time an aseptic scrub and drape were performed about the patient's respective lower extremity after anesthesia was induced as described above. Attention was then directed to the surgical area where procedure number one commenced.  Procedure #1:   A pneumatic tourniquet was placed around the right thigh. The right lower extremity was then prepped and draped in the usual sterile fashion. A timeout for safety per protocol was then taken.  The right lower extremity was elevated, examined and the thigh tourniquet was inflated to 300  mmHg.  I then directed attention to the distal Achilles tendon and the posterior calcaneus where a linear incision was made extending from the glabrous junction of the heel to the midsubstance of the Achilles tendon in area of previous incision. This was carried through subcutaneous tissue, cauterizing bleeding vessels as necessary. The paratenon was identified and incised and reflected, and the distal Achilles tendon was identified  Tendon noted to have frayed ends and retracted about 5 cm off proximal insertion. The Achilles tendon exhibited significant degenerative changes with fatty infiltration and intratendinous calcifications. These were debrided from the healthy tendon and it was reduced in bulk and thickness with a scalpel.  Attention was then directed in the retrocalcaneal space and the FHL tendon was identified by dorsiflexing the great toe. The distal aspect was exposed and resected. A whip stitch was then used to secure the tendon and sized. A wire was placed in the posterior achilles from superior to plantar lateral. A drill hole was placed sized to the tendon and tendon passed through the hole. A 6.5 anchor was inserted into the calcaneous over the flexor hallucis tendon and adequate fixation was noted.   There was ten found to be about 4-5 cm of retraction still of the achilles and elected to perform a V-Y lengthening of the achilles. The incision was extended proximally to the gastrocnemius aponeurosis and a v incision was made in the tendon. This was then retracted and distal achilles was able to extend to posterior calcaneus.   Next drill holes were then made into the posterior calcaneus for the Achilles speed bridge careful to  avoid FHL and previous anchors. Achilles was anchored down to the posterior calcaneus utilized the achilles speed bridge system and good fixation was noted. Additional  suture was used to ensure fixation of achilles.  The incision was then flushed with copious amounts of saline.  The wound was then closed in layers using.  4-0 monocryl, 4-0 nylon and skin staples.  Dry sterile compressive dressings were then applied to all previously mentioned incision sites about the patient's lower extremity. Bulky dressing placed under inferior calcaneus to keep leg in plantar flexed position The patient was then transferred from the operating room to the recovery room having tolerated the procedure and anesthesia well. All vital signs are stable. After a brief stay in the recovery room patient will discharge home.    Disposition:  Patient will be NWB in plantar flexion in CAM boot until follow-up in about 1 week. Pain medication and nausea medication as needed. ASA BID to be started tonight. Clindamycin  to be taken as well.    [name], DPM [facility]  Dr. [name], DPM    [address] [place], [place] [address]                Office [phone]  Fax [phone]

## 2024-02-29 NOTE — Anesthesia Procedure Notes (Signed)
 Anesthesia Regional Block: Popliteal block   Pre-Anesthetic Checklist: , timeout performed,  Correct Patient, Correct Site, Correct Laterality,  Correct Procedure, Correct Position, site marked,  Risks and benefits discussed,  Pre-op evaluation,  At surgeon's request and post-op pain management  Laterality: Right  Prep: Maximum Sterile Barrier Precautions used, chloraprep       Needles:  Injection technique: Single-shot  Needle Type: Echogenic Stimulator Needle     Needle Length: 9cm  Needle Gauge: 22     Additional Needles:   Procedures:,,,, ultrasound used (permanent image in chart),,    Narrative:  Start time: 02/29/2024 1:31 PM End time: 02/29/2024 1:34 PM Injection made incrementally with aspirations every 5 mL.  Performed by: Personally  Anesthesiologist: Paul Lamarr BRAVO, MD  Additional Notes: Risks, benefits, and alternative discussed. Patient gave consent for procedure. Patient prepped and draped in sterile fashion. Sedation administered, patient remains easily responsive to voice. Relevant anatomy identified with ultrasound guidance. Local anesthetic given in 5cc increments with no signs or symptoms of intravascular injection. No pain or paraesthesias with injection. Patient monitored throughout procedure with signs of LAST or immediate complications. Tolerated well. Ultrasound image placed in chart.  LANEY Paul, MD

## 2024-02-29 NOTE — Anesthesia Preprocedure Evaluation (Addendum)
 Anesthesia Evaluation  Patient identified by MRN, date of birth, ID band Patient awake    Reviewed: Allergy & Precautions, NPO status , Patient's Chart, lab work & pertinent test results  History of Anesthesia Complications Negative for: history of anesthetic complications  Airway Mallampati: II  TM Distance: >3 FB Neck ROM: Full    Dental  (+)    Pulmonary asthma , Current Smoker and Patient abstained from smoking.   Pulmonary exam normal        Cardiovascular hypertension, Normal cardiovascular exam     Neuro/Psych  Headaches  Anxiety Depression Bipolar Disorder      GI/Hepatic ,GERD  Medicated and Controlled,,  Endo/Other  diabetes (on Mounjaro ), Type 2Hypothyroidism    Renal/GU      Musculoskeletal  (+) Arthritis ,  Fibromyalgia - achilles tendon rupture right   Abdominal   Peds  Hematology   Anesthesia Other Findings   Reproductive/Obstetrics                              Anesthesia Physical Anesthesia Plan  ASA: 2  Anesthesia Plan: General   Post-op Pain Management: Tylenol  PO (pre-op)* and Regional block*   Induction: Intravenous  PONV Risk Score and Plan: 3 and Treatment may vary due to age or medical condition, Ondansetron , Dexamethasone  and Midazolam   Airway Management Planned: Oral ETT  Additional Equipment: None  Intra-op Plan:   Post-operative Plan: Extubation in OR  Informed Consent: I have reviewed the patients History and Physical, chart, labs and discussed the procedure including the risks, benefits and alternatives for the proposed anesthesia with the patient or authorized representative who has indicated his/her understanding and acceptance.     Dental advisory given  Plan Discussed with: CRNA  Anesthesia Plan Comments: (Patient unwilling to take out plastic facial piercings. She has been informed of risk of foreign body aspiration and understands these  risks. Lawence, MD)         Anesthesia Quick Evaluation

## 2024-02-29 NOTE — Interval H&P Note (Signed)
 History and Physical Interval Note:  02/29/2024 1:48 PM  Ruth [name]  has presented today for surgery, with the diagnosis of achilles tendon rupture right.  The various methods of treatment have been discussed with the patient and family. After consideration of risks, benefits and other options for treatment, the patient has consented to  Procedure(s) with comments: REPAIR, TENDON, ACHILLES (Right) TRANSFER, TENDON (Right) - with muscle redirection RECESSION, TENDON, GASTROCNEMIUS (Right) as a surgical intervention.  The patient's history has been reviewed, patient examined, no change in status, stable for surgery.  I have reviewed the patient's chart and labs.  Questions were answered to the patient's satisfaction.     [name]

## 2024-02-29 NOTE — Anesthesia Procedure Notes (Signed)
 Procedure Name: Intubation Date/Time: 02/29/2024 2:08 PM  Performed by: Delores Duwaine SAUNDERS, CRNAPre-anesthesia Checklist: Patient identified, Emergency Drugs available, Suction available and Patient being monitored Patient Re-evaluated:Patient Re-evaluated prior to induction Oxygen Delivery Method: Circle System Utilized Preoxygenation: Pre-oxygenation with 100% oxygen Induction Type: IV induction Ventilation: Mask ventilation without difficulty Laryngoscope Size: Mac and 3 Grade View: Grade II Tube type: Oral Number of attempts: 1 Airway Equipment and Method: Stylet and Oral airway Placement Confirmation: ETT inserted through vocal cords under direct vision, positive ETCO2 and breath sounds checked- equal and bilateral Secured at: 22 cm Tube secured with: Tape Dental Injury: Teeth and Oropharynx as per pre-operative assessment

## 2024-02-29 NOTE — Transfer of Care (Signed)
 Immediate Anesthesia Transfer of Care Note  Patient: Ruth [name]  Procedure(s) Performed: REPAIR, TENDON, ACHILLES (Right) TRANSFER, TENDON (Right) RECESSION, TENDON, GASTROCNEMIUS (Right)  Patient Location: PACU  Anesthesia Type:General  Level of Consciousness: awake and drowsy  Airway & Oxygen Therapy: Patient Spontanous Breathing  Post-op Assessment: Report given to RN  Post vital signs: Reviewed and stable  Last Vitals:  Vitals Value Taken Time  BP 119/70 02/29/24 15:49  Temp    Pulse 72 02/29/24 15:52  Resp 14 02/29/24 15:52  SpO2 100 % 02/29/24 15:52  Vitals shown include unfiled device data.  Last Pain:  Vitals:   02/29/24 1149  TempSrc: Oral  PainSc: 0-No pain         Complications: No notable events documented.

## 2024-02-29 NOTE — Progress Notes (Signed)
 Orthopedic Tech Progress Note Patient Details:  Ruth [name] 1975/10/13 [id number]  Ortho Devices Type of Ortho Device: CAM walker Ortho Device/Splint Interventions: Application, Ordered, Adjustment   Post Interventions Patient Tolerated: Well Instructions Provided: Care of device, Adjustment of device  [name] 02/29/2024, 4:41 PM

## 2024-02-29 NOTE — Anesthesia Postprocedure Evaluation (Signed)
 Anesthesia Post Note  Patient: Ruth [name]  Procedure(s) Performed: REPAIR, TENDON, ACHILLES (Right) TRANSFER, TENDON (Right) RECESSION, TENDON, GASTROCNEMIUS (Right)     Patient location during evaluation: PACU Anesthesia Type: General Level of consciousness: awake and alert Pain management: pain level controlled Vital Signs Assessment: post-procedure vital signs reviewed and stable Respiratory status: spontaneous breathing, nonlabored ventilation and respiratory function stable Cardiovascular status: blood pressure returned to baseline Postop Assessment: no apparent nausea or vomiting Anesthetic complications: no   No notable events documented.  Last Vitals:  Vitals:   02/29/24 1549 02/29/24 1600  BP: 119/70 110/67  Pulse: 70 69  Resp: 15 17  Temp: 36.5 C   SpO2: 100% 99%    Last Pain:  Vitals:   02/29/24 1600  TempSrc:   PainSc: 0-No pain                 Vertell Row

## 2024-03-01 ENCOUNTER — Encounter: Admitting: Podiatry

## 2024-03-01 ENCOUNTER — Encounter (HOSPITAL_COMMUNITY): Payer: Self-pay | Admitting: Podiatry

## 2024-03-02 ENCOUNTER — Other Ambulatory Visit: Payer: Self-pay | Admitting: Podiatry

## 2024-03-02 ENCOUNTER — Telehealth: Payer: Self-pay

## 2024-03-02 ENCOUNTER — Other Ambulatory Visit: Payer: Self-pay | Admitting: Family Medicine

## 2024-03-02 ENCOUNTER — Other Ambulatory Visit: Payer: Self-pay

## 2024-03-02 DIAGNOSIS — E1169 Type 2 diabetes mellitus with other specified complication: Secondary | ICD-10-CM

## 2024-03-02 MED ORDER — OXYCODONE-ACETAMINOPHEN 10-325 MG PO TABS
1.0000 | ORAL_TABLET | Freq: Three times a day (TID) | ORAL | 0 refills | Status: DC | PRN
  Filled 2024-03-02 (×2): qty 15, 5d supply, fill #0

## 2024-03-02 NOTE — Telephone Encounter (Addendum)
 PA request received from [employer] at [facility] center for Oxycodone -Acetaminophen  10/325mg  tablet. PA submitted through CoverMyMEds and waiting on response.  Ruth [name]  (Key: [id number]) Rx #: [id number]

## 2024-03-03 ENCOUNTER — Other Ambulatory Visit: Payer: Self-pay

## 2024-03-03 MED ORDER — DEXCOM G7 SENSOR MISC
2 refills | Status: DC
  Filled 2024-03-03: qty 3, 30d supply, fill #0
  Filled 2024-03-28 (×2): qty 3, 30d supply, fill #1
  Filled 2024-05-01: qty 3, 30d supply, fill #2

## 2024-03-07 ENCOUNTER — Ambulatory Visit (INDEPENDENT_AMBULATORY_CARE_PROVIDER_SITE_OTHER): Admitting: Podiatry

## 2024-03-07 DIAGNOSIS — Z91199 Patient's noncompliance with other medical treatment and regimen due to unspecified reason: Secondary | ICD-10-CM

## 2024-03-07 NOTE — Progress Notes (Signed)
 Cancel 24 hours-headache

## 2024-03-08 ENCOUNTER — Ambulatory Visit (INDEPENDENT_AMBULATORY_CARE_PROVIDER_SITE_OTHER): Admitting: Podiatry

## 2024-03-08 ENCOUNTER — Encounter: Payer: Self-pay | Admitting: Podiatry

## 2024-03-08 ENCOUNTER — Other Ambulatory Visit: Payer: Self-pay

## 2024-03-08 VITALS — BP 152/85 | HR 78 | Temp 98.1°F

## 2024-03-08 DIAGNOSIS — S86011D Strain of right Achilles tendon, subsequent encounter: Secondary | ICD-10-CM | POA: Diagnosis not present

## 2024-03-08 DIAGNOSIS — Z9889 Other specified postprocedural states: Secondary | ICD-10-CM | POA: Diagnosis not present

## 2024-03-08 MED ORDER — OXYCODONE-ACETAMINOPHEN 10-325 MG PO TABS
1.0000 | ORAL_TABLET | Freq: Three times a day (TID) | ORAL | 0 refills | Status: AC | PRN
  Filled 2024-03-08: qty 21, 7d supply, fill #0

## 2024-03-08 MED ORDER — GABAPENTIN 300 MG PO CAPS
300.0000 mg | ORAL_CAPSULE | Freq: Every day | ORAL | 3 refills | Status: AC
  Filled 2024-03-08: qty 90, 90d supply, fill #0
  Filled 2024-05-01 – 2024-06-05 (×2): qty 90, 90d supply, fill #1

## 2024-03-08 NOTE — Progress Notes (Signed)
 Subjective:  Patient ID: Ruth [name], female    DOB: [birth year],  MRN: [id number]  Chief Complaint  Patient presents with   Routine Post Op    POV # 1 DOS 02/29/24 RT ACHILLES TENDON REPAIR W/ FLEXOR HALLUCIS LONGUS TRANSFER, POSSIBLE V-Y LENGTHENING It's hurting real bad.  It's been hurting bad since I had it done.  My pain level is about an eight.  (No n/v/f/c)    DOS: 02/29/24 Procedure: Right achilles tendon repair with FHL transfer and V-Y lengthening   48 y.o. female returns for POV#1. Relates having a lot of pain.   Review of Systems: Negative except as noted in the HPI. Denies N/V/F/Ch.  Past Medical History:  Diagnosis Date   Abnormal uterine bleeding 2023   ADHD (attention deficit hyperactivity disorder)    Anemia 2023   Anxiety    Arthritis    chronic back pain    Asthma    rarely uses inhaler   Bipolar 1 disorder (HCC)    Follows w/ PCP Dr. Raguel Blush.   Depression    Fibromyalgia    GERD (gastroesophageal reflux disease)    Headache    Hidradenitis    multiple boils, axilla and groin (As of 08/03/22, patient has a boil in her left axilla.)   HLD (hyperlipidemia)    diet controlled   Hx of migraines    occ   Hypertension    no meds   Hypothyroidism    Intentional drug overdose (HCC) 03/28/2021   Obesity    Sleep apnea    Patient states that she had a sleep study in the past and was told she did not need a CPAP.   Thrombocytosis 01/26/2020   Type 2 diabetes mellitus (HCC)    Type 2, Follows w/ PCP Dr. Raguel Blush.   Wears glasses     Current Outpatient Medications:    albuterol  (PROVENTIL ) (2.5 MG/3ML) 0.083% nebulizer solution, Take 3 mLs (2.5 mg total) by nebulization every 6 (six) hours as needed for wheezing or shortness of breath., Disp: 75 mL, Rfl: 12   albuterol  (VENTOLIN  HFA) 108 (90 Base) MCG/ACT inhaler, Inhale 2 puffs into the lungs every 4 (four) hours as needed for wheezing or shortness of breath., Disp: 18 g, Rfl: 5    aspirin  EC 81 MG tablet, Take 81 mg by mouth daily. Swallow whole., Disp: , Rfl:    aspirin  EC 81 MG tablet, Take 1 tablet (81 mg total) by mouth in the morning and at bedtime. Swallow whole., Disp: 80 tablet, Rfl: 0   cetirizine  (ZYRTEC  ALLERGY) 10 MG tablet, Take 1 tablet (10 mg total) by mouth daily. (Patient taking differently: Take 10 mg by mouth at bedtime.), Disp: 90 tablet, Rfl: 1   clindamycin  (CLEOCIN ) 300 MG capsule, Take 1 capsule (300 mg total) by mouth 3 (three) times daily., Disp: 30 capsule, Rfl: 2   Continuous Glucose Sensor (DEXCOM G7 SENSOR) MISC, Use to check blood sugar three time daily. Change sensors once every 10 days., Disp: 3 each, Rfl: 2   cromolyn (OPTICROM) 4 % ophthalmic solution, Place 1 drop into both eyes in the morning and at bedtime., Disp: , Rfl:    fluticasone  (FLONASE ) 50 MCG/ACT nasal spray, Place 1 spray into both nostrils daily. (Patient taking differently: Place 1 spray into both nostrils at bedtime.), Disp: 9.9 mL, Rfl: 2   fluticasone  (FLOVENT  HFA) 110 MCG/ACT inhaler, Inhale 2 puffs into the lungs 2 (two) times daily., Disp: 1 each, Rfl: 5  hydrOXYzine  (VISTARIL ) 25 MG capsule, Take 1 capsule (25 mg total) by mouth every 8 (eight) hours as needed for anxiety., Disp: 30 capsule, Rfl: 3   ibuprofen  (ADVIL ) 600 MG tablet, Take 1 tablet (600 mg total) by mouth every 8 (eight) hours as needed., Disp: 30 tablet, Rfl: 0   Iron , Ferrous Sulfate , 325 (65 Fe) MG TABS, Take 325 mg (1 tablet) by mouth daily. (Patient taking differently: Take 325 mg by mouth at bedtime.), Disp: 30 tablet, Rfl: 3   montelukast  (SINGULAIR ) 10 MG tablet, Take 1 tablet (10 mg total) by mouth at bedtime., Disp: 90 tablet, Rfl: 1   omeprazole  (PRILOSEC) 40 MG capsule, Take 1 capsule (40 mg total) by mouth daily. (Patient taking differently: Take 40 mg by mouth daily as needed (indigestion/heartburn.).), Disp: 90 capsule, Rfl: 1   ondansetron  (ZOFRAN ) 4 MG tablet, Take 1 tablet (4 mg total) by  mouth every 8 (eight) hours as needed for nausea or vomiting., Disp: 20 tablet, Rfl: 0   ondansetron  (ZOFRAN ) 4 MG tablet, Take 1 tablet (4 mg total) by mouth every 8 (eight) hours as needed for nausea or vomiting., Disp: 20 tablet, Rfl: 0   oxyCODONE -acetaminophen  (PERCOCET) 10-325 MG tablet, Take 1 tablet by mouth every 8 (eight) hours as needed for up to 5 days for pain., Disp: 15 tablet, Rfl: 0   rosuvastatin  (CRESTOR ) 20 MG tablet, Take 1 tablet (20 mg total) by mouth daily. (Patient taking differently: Take 20 mg by mouth every evening.), Disp: 90 tablet, Rfl: 1   sertraline  (ZOLOFT ) 50 MG tablet, Take 1 tablet (50 mg total) by mouth daily., Disp: 90 tablet, Rfl: 0   tirzepatide  (MOUNJARO ) 10 MG/0.5ML Pen, Inject 10 mg into the skin once a week. (Patient taking differently: Inject 10 mg into the skin every Tuesday.), Disp: 6 mL, Rfl: 1   XIIDRA 5 % SOLN, Place 1 drop into both eyes 2 (two) times daily., Disp: , Rfl:   Social History   Tobacco Use  Smoking Status Every Day   Current packs/day: 0.25   Types: Cigarettes  Smokeless Tobacco Never  Tobacco Comments   Per patient, she only smokes 2 to 3 cigarettes daily as of 08/03/22.    Allergies  Allergen Reactions   Metformin  And Related Diarrhea   Penicillins Other (See Comments)    Unknown; from childhood Has patient had a PCN reaction causing immediate rash, facial/tongue/throat swelling, SOB or lightheadedness with hypotension: Unknow Has patient had a PCN reaction causing severe rash involving mucus membranes or skin necrosis: Unknown Has patient had a PCN reaction that required hospitalization: Unknown Has patient had a PCN reaction occurring within the last 10 years: Unknown If all of the above answers are NO, then may proceed with Cephalosporin use.    Victoza [Liraglutide] Itching   Objective:   Vitals:   03/08/24 1004  BP: (!) 152/85  Pulse: 78  Temp: 98.1 F (36.7 C)   There is no height or weight on file to  calculate BMI. Constitutional Well developed. Well nourished.  Vascular Foot warm and well perfused. Capillary refill normal to all digits.   Neurologic Normal speech. Oriented to person, place, and time. Epicritic sensation to light touch grossly present bilaterally.  Dermatologic Skin healing well without signs of infection. Skin edges well coapted without signs of infection.  Orthopedic: Tenderness to palpation noted about the surgical site.   Assessment:  No diagnosis found. Plan:  Patient was evaluated and treated and all questions answered.  S/p  foot surgery right -Progressing as expected post-operatively. -WB Status: NWB in plantarflexed boot.  -Sutures: intact. -Medications: gabapentin  and oxycodone  10 mg/325 sent to pharmacy  -New boot provided as previous not providing adequate support and irritating her.  -Foot redressed.   Return in 2 weeks for suture removal.   No follow-ups on file.

## 2024-03-21 ENCOUNTER — Encounter: Payer: Self-pay | Admitting: Podiatry

## 2024-03-21 ENCOUNTER — Other Ambulatory Visit: Payer: Self-pay

## 2024-03-21 ENCOUNTER — Ambulatory Visit (INDEPENDENT_AMBULATORY_CARE_PROVIDER_SITE_OTHER): Payer: Self-pay | Admitting: Podiatry

## 2024-03-21 VITALS — BP 142/85 | HR 77 | Temp 97.2°F

## 2024-03-21 DIAGNOSIS — S86011D Strain of right Achilles tendon, subsequent encounter: Secondary | ICD-10-CM

## 2024-03-21 DIAGNOSIS — Z9889 Other specified postprocedural states: Secondary | ICD-10-CM

## 2024-03-21 MED ORDER — IBUPROFEN 600 MG PO TABS
600.0000 mg | ORAL_TABLET | Freq: Three times a day (TID) | ORAL | 0 refills | Status: DC | PRN
  Filled 2024-03-21: qty 30, 10d supply, fill #0

## 2024-03-21 MED ORDER — OXYCODONE-ACETAMINOPHEN 10-325 MG PO TABS
1.0000 | ORAL_TABLET | Freq: Three times a day (TID) | ORAL | 0 refills | Status: AC | PRN
  Filled 2024-03-21: qty 15, 7d supply, fill #0

## 2024-03-21 NOTE — Progress Notes (Signed)
 Subjective:  Patient ID: Ruth [name], female    DOB: 03-24-1976,  MRN: [id number]  Chief Complaint  Patient presents with   Routine Post Op    POV # 2 DOS 02/29/24 RT ACHILLES TENDON REPAIR W/ FLEXOR HALLUCIS LONGUS TRANSFER, POSSIBLE V-Y LENGTHENING It's still in a little pain.  My pain level is about an eight on bad days.  These staples are embedded.    DOS: 02/29/24 Procedure: Right achilles tendon repair with FHL transfer and V-Y lengthening   48 y.o. female returns for POV#2. Relates having a lot of pain.   Review of Systems: Negative except as noted in the HPI. Denies N/V/F/Ch.  Past Medical History:  Diagnosis Date   Abnormal uterine bleeding 2023   ADHD (attention deficit hyperactivity disorder)    Anemia 2023   Anxiety    Arthritis    chronic back pain    Asthma    rarely uses inhaler   Bipolar 1 disorder (HCC)    Follows w/ PCP Dr. Raguel Blush.   Depression    Fibromyalgia    GERD (gastroesophageal reflux disease)    Headache    Hidradenitis    multiple boils, axilla and groin (As of 08/03/22, patient has a boil in her left axilla.)   HLD (hyperlipidemia)    diet controlled   Hx of migraines    occ   Hypertension    no meds   Hypothyroidism    Intentional drug overdose (HCC) 03/28/2021   Obesity    Sleep apnea    Patient states that she had a sleep study in the past and was told she did not need a CPAP.   Thrombocytosis 01/26/2020   Type 2 diabetes mellitus (HCC)    Type 2, Follows w/ PCP Dr. Raguel Blush.   Wears glasses     Current Outpatient Medications:    albuterol  (PROVENTIL ) (2.5 MG/3ML) 0.083% nebulizer solution, Take 3 mLs (2.5 mg total) by nebulization every 6 (six) hours as needed for wheezing or shortness of breath., Disp: 75 mL, Rfl: 12   albuterol  (VENTOLIN  HFA) 108 (90 Base) MCG/ACT inhaler, Inhale 2 puffs into the lungs every 4 (four) hours as needed for wheezing or shortness of breath., Disp: 18 g, Rfl: 5   aspirin  EC  81 MG tablet, Take 81 mg by mouth daily. Swallow whole., Disp: , Rfl:    aspirin  EC 81 MG tablet, Take 1 tablet (81 mg total) by mouth in the morning and at bedtime. Swallow whole., Disp: 80 tablet, Rfl: 0   cetirizine  (ZYRTEC  ALLERGY) 10 MG tablet, Take 1 tablet (10 mg total) by mouth daily. (Patient taking differently: Take 10 mg by mouth at bedtime.), Disp: 90 tablet, Rfl: 1   clindamycin  (CLEOCIN ) 300 MG capsule, Take 1 capsule (300 mg total) by mouth 3 (three) times daily., Disp: 30 capsule, Rfl: 2   Continuous Glucose Sensor (DEXCOM G7 SENSOR) MISC, Use to check blood sugar three time daily. Change sensors once every 10 days., Disp: 3 each, Rfl: 2   cromolyn (OPTICROM) 4 % ophthalmic solution, Place 1 drop into both eyes in the morning and at bedtime., Disp: , Rfl:    fluticasone  (FLONASE ) 50 MCG/ACT nasal spray, Place 1 spray into both nostrils daily. (Patient taking differently: Place 1 spray into both nostrils at bedtime.), Disp: 9.9 mL, Rfl: 2   fluticasone  (FLOVENT  HFA) 110 MCG/ACT inhaler, Inhale 2 puffs into the lungs 2 (two) times daily., Disp: 1 each, Rfl: 5   gabapentin  (  NEURONTIN ) 300 MG capsule, Take 1 capsule (300 mg total) by mouth at bedtime., Disp: 90 capsule, Rfl: 3   hydrOXYzine  (VISTARIL ) 25 MG capsule, Take 1 capsule (25 mg total) by mouth every 8 (eight) hours as needed for anxiety., Disp: 30 capsule, Rfl: 3   ibuprofen  (ADVIL ) 600 MG tablet, Take 1 tablet (600 mg total) by mouth every 8 (eight) hours as needed., Disp: 30 tablet, Rfl: 0   Iron , Ferrous Sulfate , 325 (65 Fe) MG TABS, Take 325 mg (1 tablet) by mouth daily. (Patient taking differently: Take 325 mg by mouth at bedtime.), Disp: 30 tablet, Rfl: 3   montelukast  (SINGULAIR ) 10 MG tablet, Take 1 tablet (10 mg total) by mouth at bedtime., Disp: 90 tablet, Rfl: 1   omeprazole  (PRILOSEC) 40 MG capsule, Take 1 capsule (40 mg total) by mouth daily. (Patient taking differently: Take 40 mg by mouth daily as needed  (indigestion/heartburn.).), Disp: 90 capsule, Rfl: 1   ondansetron  (ZOFRAN ) 4 MG tablet, Take 1 tablet (4 mg total) by mouth every 8 (eight) hours as needed for nausea or vomiting., Disp: 20 tablet, Rfl: 0   ondansetron  (ZOFRAN ) 4 MG tablet, Take 1 tablet (4 mg total) by mouth every 8 (eight) hours as needed for nausea or vomiting., Disp: 20 tablet, Rfl: 0   rosuvastatin  (CRESTOR ) 20 MG tablet, Take 1 tablet (20 mg total) by mouth daily. (Patient taking differently: Take 20 mg by mouth every evening.), Disp: 90 tablet, Rfl: 1   sertraline  (ZOLOFT ) 50 MG tablet, Take 1 tablet (50 mg total) by mouth daily., Disp: 90 tablet, Rfl: 0   tirzepatide  (MOUNJARO ) 10 MG/0.5ML Pen, Inject 10 mg into the skin once a week. (Patient taking differently: Inject 10 mg into the skin every Tuesday.), Disp: 6 mL, Rfl: 1   XIIDRA 5 % SOLN, Place 1 drop into both eyes 2 (two) times daily., Disp: , Rfl:   Social History   Tobacco Use  Smoking Status Every Day   Current packs/day: 0.25   Types: Cigarettes  Smokeless Tobacco Never  Tobacco Comments   Per patient, she only smokes 2 to 3 cigarettes daily as of 08/03/22.    Allergies  Allergen Reactions   Metformin  And Related Diarrhea   Penicillins Other (See Comments)    Unknown; from childhood Has patient had a PCN reaction causing immediate rash, facial/tongue/throat swelling, SOB or lightheadedness with hypotension: Unknow Has patient had a PCN reaction causing severe rash involving mucus membranes or skin necrosis: Unknown Has patient had a PCN reaction that required hospitalization: Unknown Has patient had a PCN reaction occurring within the last 10 years: Unknown If all of the above answers are NO, then may proceed with Cephalosporin use.    Victoza [Liraglutide] Itching   Objective:   Vitals:   03/21/24 1025  BP: (!) 142/85  Pulse: 77  Temp: (!) 97.2 F (36.2 C)    There is no height or weight on file to calculate BMI. Constitutional Well  developed. Well nourished.  Vascular Foot warm and well perfused. Capillary refill normal to all digits.   Neurologic Normal speech. Oriented to person, place, and time. Epicritic sensation to light touch grossly present bilaterally.  Dermatologic Skin healing well without signs of infection. Skin edges well coapted without signs of infection. Small scabbing area noted to distal incision.   Orthopedic: Tenderness to palpation noted about the surgical site.   Assessment:   1. Status post surgery   2. Rupture of right Achilles tendon, subsequent encounter  Plan:  Patient was evaluated and treated and all questions answered.  S/p foot surgery right -Progressing as expected post-operatively. -WB Status: NWB in plantarflexed boot. For three more weeks.  -Sutures: removed today without incident. Advised to keep bandaid over questionable area on incision.  -Medications: ibuprofen  and oxycodone  10 mg/325 sent to pharmacy  -Foot redressed.   Return in 3 weeks for recheck.   No follow-ups on file.

## 2024-03-27 ENCOUNTER — Telehealth: Payer: Self-pay | Admitting: Family Medicine

## 2024-03-27 NOTE — Telephone Encounter (Signed)
 Confimred appt for  9/23

## 2024-03-28 ENCOUNTER — Other Ambulatory Visit: Payer: Self-pay

## 2024-03-28 ENCOUNTER — Telehealth: Payer: Self-pay | Admitting: Family Medicine

## 2024-03-28 ENCOUNTER — Ambulatory Visit: Admitting: Pharmacist

## 2024-03-28 ENCOUNTER — Other Ambulatory Visit: Payer: Self-pay | Admitting: Family Medicine

## 2024-03-28 MED ORDER — IRON (FERROUS SULFATE) 325 (65 FE) MG PO TABS
325.0000 mg | ORAL_TABLET | Freq: Every day | ORAL | 3 refills | Status: DC
  Filled 2024-03-28: qty 30, 30d supply, fill #0
  Filled 2024-05-01: qty 30, 30d supply, fill #1
  Filled 2024-06-05: qty 30, 30d supply, fill #2
  Filled 2024-07-03: qty 30, 30d supply, fill #3

## 2024-03-28 MED ORDER — OMEPRAZOLE 40 MG PO CPDR
40.0000 mg | DELAYED_RELEASE_CAPSULE | Freq: Every day | ORAL | 1 refills | Status: AC
  Filled 2024-03-28 – 2024-03-29 (×2): qty 90, 90d supply, fill #0
  Filled 2024-05-01 – 2024-07-03 (×2): qty 90, 90d supply, fill #1

## 2024-03-28 NOTE — Telephone Encounter (Signed)
 Copied from CRM [id number]. Topic: General - Other >> Mar 28, 2024  9:08 AM Berwyn MATSU wrote: Reason for CRM: patient request that [facility] call her instead of coming in. Patient just had surgery on foot and is requesting a phone call better.

## 2024-03-28 NOTE — Telephone Encounter (Signed)
 Appointment scheduled for 05/02/2024 at 10:00 am.

## 2024-03-29 ENCOUNTER — Other Ambulatory Visit: Payer: Self-pay

## 2024-04-03 ENCOUNTER — Telehealth: Payer: Self-pay

## 2024-04-03 NOTE — Telephone Encounter (Signed)
 Pharmacy Patient Advocate Encounter  Received notification from [facility] MEDICAID that Prior Authorization for MOUNJARO  has been APPROVED from 03/30/2024 to 03/30/2025   PA #/Case ID/Reference #: [id number]

## 2024-04-10 ENCOUNTER — Other Ambulatory Visit: Payer: Self-pay

## 2024-04-10 ENCOUNTER — Encounter: Payer: Self-pay | Admitting: Podiatry

## 2024-04-10 ENCOUNTER — Ambulatory Visit (INDEPENDENT_AMBULATORY_CARE_PROVIDER_SITE_OTHER): Admitting: Podiatry

## 2024-04-10 ENCOUNTER — Other Ambulatory Visit: Payer: Self-pay | Admitting: Family Medicine

## 2024-04-10 DIAGNOSIS — M7662 Achilles tendinitis, left leg: Secondary | ICD-10-CM

## 2024-04-10 DIAGNOSIS — Z9889 Other specified postprocedural states: Secondary | ICD-10-CM

## 2024-04-10 DIAGNOSIS — M7661 Achilles tendinitis, right leg: Secondary | ICD-10-CM

## 2024-04-10 DIAGNOSIS — S86011D Strain of right Achilles tendon, subsequent encounter: Secondary | ICD-10-CM

## 2024-04-10 MED ORDER — OXYCODONE-ACETAMINOPHEN 5-325 MG PO TABS
1.0000 | ORAL_TABLET | ORAL | 0 refills | Status: AC | PRN
  Filled 2024-04-10: qty 30, 5d supply, fill #0

## 2024-04-10 NOTE — Progress Notes (Signed)
 Subjective:  Patient ID: Ruth [name], female    DOB: 30-Apr-1976,  MRN: [id number]  No chief complaint on file.   DOS: 02/29/24 Procedure: Right achilles tendon repair with FHL transfer and V-Y lengthening   48 y.o. female returns for POV#3. Relates doing better. Has been non weight bearing. She is still tender on both heels.   Review of Systems: Negative except as noted in the HPI. Denies N/V/F/Ch.  Past Medical History:  Diagnosis Date   Abnormal uterine bleeding 2023   ADHD (attention deficit hyperactivity disorder)    Anemia 2023   Anxiety    Arthritis    chronic back pain    Asthma    rarely uses inhaler   Bipolar 1 disorder (HCC)    Follows w/ PCP Dr. Raguel Blush.   Depression    Fibromyalgia    GERD (gastroesophageal reflux disease)    Headache    Hidradenitis    multiple boils, axilla and groin (As of 08/03/22, patient has a boil in her left axilla.)   HLD (hyperlipidemia)    diet controlled   Hx of migraines    occ   Hypertension    no meds   Hypothyroidism    Intentional drug overdose (HCC) 03/28/2021   Obesity    Sleep apnea    Patient states that she had a sleep study in the past and was told she did not need a CPAP.   Thrombocytosis 01/26/2020   Type 2 diabetes mellitus (HCC)    Type 2, Follows w/ PCP Dr. Raguel Blush.   Wears glasses     Current Outpatient Medications:    albuterol  (PROVENTIL ) (2.5 MG/3ML) 0.083% nebulizer solution, Take 3 mLs (2.5 mg total) by nebulization every 6 (six) hours as needed for wheezing or shortness of breath., Disp: 75 mL, Rfl: 12   albuterol  (VENTOLIN  HFA) 108 (90 Base) MCG/ACT inhaler, Inhale 2 puffs into the lungs every 4 (four) hours as needed for wheezing or shortness of breath., Disp: 18 g, Rfl: 5   aspirin  EC 81 MG tablet, Take 81 mg by mouth daily. Swallow whole., Disp: , Rfl:    cetirizine  (ZYRTEC  ALLERGY) 10 MG tablet, Take 1 tablet (10 mg total) by mouth daily. (Patient taking differently: Take  10 mg by mouth at bedtime.), Disp: 90 tablet, Rfl: 1   clindamycin  (CLEOCIN ) 300 MG capsule, Take 1 capsule (300 mg total) by mouth 3 (three) times daily., Disp: 30 capsule, Rfl: 2   Continuous Glucose Sensor (DEXCOM G7 SENSOR) MISC, Use to check blood sugar three time daily. Change sensors once every 10 days., Disp: 3 each, Rfl: 2   cromolyn (OPTICROM) 4 % ophthalmic solution, Place 1 drop into both eyes in the morning and at bedtime., Disp: , Rfl:    fluticasone  (FLONASE ) 50 MCG/ACT nasal spray, Place 1 spray into both nostrils daily. (Patient taking differently: Place 1 spray into both nostrils at bedtime.), Disp: 9.9 mL, Rfl: 2   fluticasone  (FLOVENT  HFA) 110 MCG/ACT inhaler, Inhale 2 puffs into the lungs 2 (two) times daily., Disp: 1 each, Rfl: 5   gabapentin  (NEURONTIN ) 300 MG capsule, Take 1 capsule (300 mg total) by mouth at bedtime., Disp: 90 capsule, Rfl: 3   hydrOXYzine  (VISTARIL ) 25 MG capsule, Take 1 capsule (25 mg total) by mouth every 8 (eight) hours as needed for anxiety., Disp: 30 capsule, Rfl: 3   ibuprofen  (ADVIL ) 600 MG tablet, Take 1 tablet (600 mg total) by mouth every 8 (eight) hours as needed.,  Disp: 30 tablet, Rfl: 0   Iron , Ferrous Sulfate , 325 (65 Fe) MG TABS, Take 325 mg (1 tablet) by mouth daily., Disp: 30 tablet, Rfl: 3   montelukast  (SINGULAIR ) 10 MG tablet, Take 1 tablet (10 mg total) by mouth at bedtime., Disp: 90 tablet, Rfl: 1   omeprazole  (PRILOSEC) 40 MG capsule, Take 1 capsule (40 mg total) by mouth daily., Disp: 90 capsule, Rfl: 1   ondansetron  (ZOFRAN ) 4 MG tablet, Take 1 tablet (4 mg total) by mouth every 8 (eight) hours as needed for nausea or vomiting., Disp: 20 tablet, Rfl: 0   ondansetron  (ZOFRAN ) 4 MG tablet, Take 1 tablet (4 mg total) by mouth every 8 (eight) hours as needed for nausea or vomiting., Disp: 20 tablet, Rfl: 0   rosuvastatin  (CRESTOR ) 20 MG tablet, Take 1 tablet (20 mg total) by mouth daily. (Patient taking differently: Take 20 mg by mouth  every evening.), Disp: 90 tablet, Rfl: 1   sertraline  (ZOLOFT ) 50 MG tablet, Take 1 tablet (50 mg total) by mouth daily., Disp: 90 tablet, Rfl: 0   tirzepatide  (MOUNJARO ) 10 MG/0.5ML Pen, Inject 10 mg into the skin once a week. (Patient taking differently: Inject 10 mg into the skin every Tuesday.), Disp: 6 mL, Rfl: 1   XIIDRA 5 % SOLN, Place 1 drop into both eyes 2 (two) times daily., Disp: , Rfl:   Social History   Tobacco Use  Smoking Status Every Day   Current packs/day: 0.25   Types: Cigarettes  Smokeless Tobacco Never  Tobacco Comments   Per patient, she only smokes 2 to 3 cigarettes daily as of 08/03/22.    Allergies  Allergen Reactions   Metformin  And Related Diarrhea   Penicillins Other (See Comments)    Unknown; from childhood Has patient had a PCN reaction causing immediate rash, facial/tongue/throat swelling, SOB or lightheadedness with hypotension: Unknow Has patient had a PCN reaction causing severe rash involving mucus membranes or skin necrosis: Unknown Has patient had a PCN reaction that required hospitalization: Unknown Has patient had a PCN reaction occurring within the last 10 years: Unknown If all of the above answers are NO, then may proceed with Cephalosporin use.    Victoza [Liraglutide] Itching   Objective:   There were no vitals filed for this visit.   There is no height or weight on file to calculate BMI. Constitutional Well developed. Well nourished.  Vascular Foot warm and well perfused. Capillary refill normal to all digits.   Neurologic Normal speech. Oriented to person, place, and time. Epicritic sensation to light touch grossly present bilaterally.  Dermatologic Skin healing well without signs of infection. Skin edges well coapted without signs of infection. Small scabbing area noted to distal incision.   Orthopedic: Tenderness to palpation noted about the surgical site.   Assessment:   1. Status post surgery   2. Rupture of right  Achilles tendon, subsequent encounter    Plan:  Patient was evaluated and treated and all questions answered.  S/p foot surgery right -Progressing as expected post-operatively. -WB Status: May begin WBAT in CAM boot for two weeks then may transition to regular shoes.  -PT to work on transition out of boot and into regular shoe.  -Medications: Percocet refilled. 5 mg /325 x30.  -Foot redressed.   Return in 6 weeks for recheck.   No follow-ups on file.

## 2024-04-11 ENCOUNTER — Other Ambulatory Visit: Payer: Self-pay

## 2024-04-11 MED ORDER — SERTRALINE HCL 50 MG PO TABS
50.0000 mg | ORAL_TABLET | Freq: Every day | ORAL | 0 refills | Status: DC
  Filled 2024-04-11 (×2): qty 90, 90d supply, fill #0

## 2024-04-17 ENCOUNTER — Other Ambulatory Visit: Payer: Self-pay

## 2024-04-21 ENCOUNTER — Ambulatory Visit: Attending: Podiatry

## 2024-04-21 DIAGNOSIS — M6281 Muscle weakness (generalized): Secondary | ICD-10-CM | POA: Diagnosis present

## 2024-04-21 DIAGNOSIS — M7661 Achilles tendinitis, right leg: Secondary | ICD-10-CM | POA: Diagnosis not present

## 2024-04-21 DIAGNOSIS — M7662 Achilles tendinitis, left leg: Secondary | ICD-10-CM | POA: Insufficient documentation

## 2024-04-21 DIAGNOSIS — M25571 Pain in right ankle and joints of right foot: Secondary | ICD-10-CM | POA: Insufficient documentation

## 2024-04-21 DIAGNOSIS — R2689 Other abnormalities of gait and mobility: Secondary | ICD-10-CM | POA: Insufficient documentation

## 2024-04-21 DIAGNOSIS — S86011D Strain of right Achilles tendon, subsequent encounter: Secondary | ICD-10-CM | POA: Diagnosis not present

## 2024-04-21 DIAGNOSIS — Z9889 Other specified postprocedural states: Secondary | ICD-10-CM | POA: Insufficient documentation

## 2024-04-21 DIAGNOSIS — M25572 Pain in left ankle and joints of left foot: Secondary | ICD-10-CM | POA: Insufficient documentation

## 2024-04-21 NOTE — Therapy (Addendum)
 OUTPATIENT PHYSICAL THERAPY LOWER EXTREMITY EVALUATION/ DISCHARGE    Patient discharged due to not returning after her evaluation.   Joneen Fresh PT, DPT, LAT, ATC  05/03/24  8:40 AM       Patient Name: Ruth [name] MRN: [id number] DOB:08/30/1975, 48 y.o., female Today's Date: 04/24/2024  END OF SESSION:  PT End of Session - 04/24/24 0750     Visit Number 1    Number of Visits 24    Date for Recertification  07/17/24    Authorization Type [employer]    Authorization - Number of Visits 27    PT Start Time 0845    PT Stop Time 0928    PT Time Calculation (min) 43 min    Activity Tolerance Patient tolerated treatment well    Behavior During Therapy [facility] -  for tasks assessed/performed          Past Medical History:  Diagnosis Date   Abnormal uterine bleeding 2023   ADHD (attention deficit hyperactivity disorder)    Anemia 2023   Anxiety    Arthritis    chronic back pain    Asthma    rarely uses inhaler   Bipolar 1 disorder (HCC)    Follows w/ PCP Dr. Raguel Blush.   Depression    Fibromyalgia    GERD (gastroesophageal reflux disease)    Headache    Hidradenitis    multiple boils, axilla and groin (As of 08/03/22, patient has a boil in her left axilla.)   HLD (hyperlipidemia)    diet controlled   Hx of migraines    occ   Hypertension    no meds   Hypothyroidism    Intentional drug overdose (HCC) 03/28/2021   Obesity    Sleep apnea    Patient states that she had a sleep study in the past and was told she did not need a CPAP.   Thrombocytosis 01/26/2020   Type 2 diabetes mellitus (HCC)    Type 2, Follows w/ PCP Dr. Raguel Blush.   Wears glasses    Past Surgical History:  Procedure Laterality Date   ACHILLES TENDON SURGERY Left 08/13/2023   Procedure: ACHILLES TENDON REPAIR;  Surgeon: Joya Stabs, DPM;  Location: [facility];  Service: Orthopedics/Podiatry;  Laterality: Left;   ACHILLES TENDON SURGERY Right 02/29/2024   Procedure: REPAIR,  TENDON, ACHILLES;  Surgeon: Joya Stabs, DPM;  Location: [facility];  Service: Orthopedics/Podiatry;  Laterality: Right;   ANTERIOR CERVICAL DECOMP/DISCECTOMY FUSION N/A 01/16/2021   Procedure: Cervical five-six Anterior cervical decompression/discectomy/fusion;  Surgeon: Lanis Pupa, MD;  Location: [facility];  Service: Neurosurgery;  Laterality: N/A;   CALCANEAL OSTEOTOMY Right 01/21/2024   Procedure: OSTEOTOMY, CALCANEUS;  Surgeon: Joya Stabs, DPM;  Location: [facility];  Service: Orthopedics/Podiatry;  Laterality: Right;  POSTERIOR POP BLOCK   CARPAL TUNNEL RELEASE Right    around 2009   CESAREAN SECTION     x2, 2003 & 2008   CYSTOSCOPY N/A 08/12/2022   Procedure: CYSTOSCOPY;  Surgeon: Jeralyn Crutch, MD;  Location: [facility];  Service: Gynecology;  Laterality: N/A;   DILITATION & CURRETTAGE/HYSTROSCOPY WITH NOVASURE ABLATION N/A 12/07/2017   Procedure: DILATATION & CURETTAGE/HYSTEROSCOPY WITH ATTEMPED NOVASURE ABLATION, FAILED/ENDOMETERIAL ABLATION WITH HTA SYSTEM;  Surgeon: Corene Coy, MD;  Location: [facility];  Service: Gynecology;  Laterality: N/A;   ELBOW SURGERY Left    surgery for nerve pain around 2009   ENDOMETRIAL BIOPSY  07/23/2022   negative for hyperplasia or malignancy   GANGLION CYST EXCISION Right  right arm    GASTROC RECESSION EXTREMITY Right 02/29/2024   Procedure: RECESSION, TENDON, GASTROCNEMIUS;  Surgeon: Joya Stabs, DPM;  Location: [facility];  Service: Orthopedics/Podiatry;  Laterality: Right;   OSTECTOMY Left 08/13/2023   Procedure: CALCANEAL OSTECTOMY;  Surgeon: Joya Stabs, DPM;  Location: [facility];  Service: Orthopedics/Podiatry;  Laterality: Left;  POPLITEAL/SAPHENOUS BLOCK   RECTOVAGINAL FISTULA CLOSURE     in 8001,8000, 2006   ROBOTIC ASSISTED TOTAL HYSTERECTOMY Bilateral 08/12/2022   Procedure: XI ROBOTIC ASSISTED TOTAL HYSTERECTOMY WITH SALPINGECTOMY;  Surgeon: Jeralyn Crutch, MD;  Location: [facility];  Service: Gynecology;  Laterality: Bilateral;   TENDON REPAIR Right 01/21/2024   Procedure: TENDON REPAIR;  Surgeon: Joya Stabs, DPM;  Location: [facility];  Service: Orthopedics/Podiatry;  Laterality: Right;   TENDON TRANSFER Right 02/29/2024   Procedure: TRANSFER, TENDON;  Surgeon: Joya Stabs, DPM;  Location: [facility];  Service: Orthopedics/Podiatry;  Laterality: Right;  with muscle redirection   TONSILLECTOMY     childhood   TUBAL LIGATION  2008   XI ROBOT ASSISTED RECTOPEXY N/A 08/12/2022   Procedure: XI robot repair serosal repair of rectum;  Surgeon: Sheldon Standing, MD;  Location: [facility];  Service: General;  Laterality: N/A;   Patient Active Problem List   Diagnosis Date Noted   Achilles tendon rupture, right, subsequent encounter 02/29/2024   Tendonitis, Achilles, right 01/21/2024   Stenosis of cervical spine with myelopathy (HCC) 01/16/2021   BMI 45.0-49.9, adult (HCC) 09/04/2020   Right arm pain 02/14/2020   Hyperlipidemia associated with type 2 diabetes mellitus (HCC) 09/19/2019   Abnormal uterine bleeding (AUB) 12/07/2017   Abnormal mammogram of both breasts 11/02/2016   Hypothyroidism 06/21/2009   Type 2 diabetes mellitus with peripheral neuropathy (HCC) 06/21/2009   BIPOLAR AFFECTIVE DISORDER 06/21/2009   PERSISTENT DISORDER INITIATING/MAINTAINING SLEEP 06/21/2009   INADEQUATE SLEEP HYGIENE 06/21/2009   OBSTRUCTIVE SLEEP APNEA 06/21/2009   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   CARPAL TUNNEL SYNDROME 06/20/2009   MERALGIA PARESTHETICA 06/20/2009   Essential hypertension 06/20/2009   FIBROMYALGIA 06/20/2009   Headache 06/20/2009    PCP: Tanda Bleacher, MD  REFERRING PROVIDER: Joya Stabs, DPM  REFERRING DIAG:  [phone] (ICD-10-CM) - Rupture of right Achilles tendon, subsequent encounter Z98.890 (ICD-10-CM) - Status post surgery M76.61,M76.62 (ICD-10-CM) - Achilles tendonitis, bilateral  THERAPY DIAG:  Pain in right ankle and joints of  right foot  Other abnormalities of gait and mobility  Muscle weakness (generalized)  Pain in left ankle and joints of left foot  Rationale for Evaluation and Treatment: Rehabilitation  ONSET DATE: 01/21/2024 and 02/29/2024  SUBJECTIVE:   SUBJECTIVE STATEMENT: Pt presents to PT s/p R achilles tendon repair with RFHL transfer performed on 02/29/2024. She arrives in slides ambulating with axillary crutches, left CAM boot at home as she had to drive herself. Same surgery earlier this year on L side on 08/13/2023. L side is feeling better but is still very tight. Had a fall while stepping out of shower a few weeks ago.   PERTINENT HISTORY: Bilateral achilles sx, Fibromyalgia, HTN  PAIN:  Are you having pain?  Yes: NPRS scale: 5/10 Worst: 7/10 Pain location: R heel, medial ankle Pain description: sharp, sore, swollen Aggravating factors: stairs,  Relieving factors: medication, rest  PRECAUTIONS: None  RED FLAGS: None   WEIGHT BEARING RESTRICTIONS: Yes - WBAT in CAM boot until 04/24/2024 then transition to tennis shoes  FALLS:  Has patient fallen in last 6 months? Yes. Number of falls - fall while stepping  out of the shower  LIVING ENVIRONMENT: Lives with: lives with their family Lives in: House/apartment Stairs: Yes: Internal: 14 steps; on right going up Has following equipment at home: Single point cane, Crutches, and CAM boot  OCCUPATION: Advertising Copywriter   PLOF: Independent  PATIENT GOALS: decrease foot pain bilaterally, improve comfort with home ADLs and mobility, be able to work with decreased pain  NEXT MD VISIT: 05/22/2024  OBJECTIVE:  Note: Objective measures were completed at Evaluation unless otherwise noted.  DIAGNOSTIC FINDINGS: See imaging   PATIENT SURVEYS:  LEFS  Extreme difficulty/unable (0), Quite a bit of difficulty (1), Moderate difficulty (2), Little difficulty (3), No difficulty (4) Survey date:  04/21/2024  Any of your usual work, housework or  school activities 3  2. Usual hobbies, recreational or sporting activities 3  3. Getting into/out of the bath 2  4. Walking between rooms 3  5. Putting on socks/shoes 3  6. Squatting  2  7. Lifting an object, like a bag of groceries from the floor 3  8. Performing light activities around your home 3  9. Performing heavy activities around your home 1  10. Getting into/out of a car 2  11. Walking 2 blocks 1  12. Walking 1 mile 1  13. Going up/down 10 stairs (1 flight) 1  14. Standing for 1 hour 0  15. Sitting for 1 hour 4  16. Running on even ground 0  17. Running on uneven ground 0  18. Making sharp turns while running fast 0  19. Hopping  0  20. Rolling over in bed 1  Score total:  33/80     COGNITION: Overall cognitive status: Within functional limits for tasks assessed     SENSATION: WFL  POSTURE: rounded shoulders and forward head  PALPATION: Slight TTP to bilateral heels, keloided scar L ankle sx incision  LOWER EXTREMITY ROM:  Active ROM Right eval Left eval  Hip flexion    Hip extension    Hip abduction    Hip adduction    Hip internal rotation    Hip external rotation    Knee flexion    Knee extension    Ankle dorsiflexion -5 2  Ankle plantarflexion 35 45  Ankle inversion    Ankle eversion     (Blank rows = not tested)  LOWER EXTREMITY MMT:  MMT Right eval Left eval  Hip flexion    Hip extension    Hip abduction    Hip adduction    Hip internal rotation    Hip external rotation    Knee flexion    Knee extension    Ankle dorsiflexion    Ankle plantarflexion    Ankle inversion    Ankle eversion     (Blank rows = not tested)  LOWER EXTREMITY SPECIAL TESTS:  DNT  FUNCTIONAL TESTS:  SLS: 0 seconds bilaterally  GAIT: Distance walked: 83ft Assistive device utilized: Crutches Level of assistance: Modified independence Comments: antalgic gait R  TREATMENT: [facility] Adult PT Treatment:                                                DATE:  04/21/2024 Therapeutic Exercise: Ankle pumps x 20 ea Ankle circles x 20 R Long sitting L ankle in/ev AROM x 10 Seated heel raise x 10  PATIENT EDUCATION:  Education details: eval  findings, LEFS, heel wedges, HEP, POC Person educated: Patient Education method: Explanation, Demonstration, and Handouts Education comprehension: verbalized understanding and returned demonstration  HOME EXERCISE PROGRAM: Access Code: [email or web address] URL: [email or web address] Date: 04/21/2024 Prepared by: Alm Kingdom  Exercises - Supine Single Leg Ankle Pumps  - 2-3 x daily - 7 x weekly - 2-3 sets - 20 reps - Supine Ankle Circles  - 2-3 x daily - 7 x weekly - 2-3 sets - 20 reps - Supine Ankle Eversion AROM  - 2-3 x daily - 7 x weekly - 3 sets - 10 reps - Supine Ankle Inversion AROM  - 2-3 x daily - 7 x weekly - 3 sets - 10 reps - Seated Heel Raise  - 2-3 x daily - 7 x weekly - 3 sets - 10 reps  ASSESSMENT:  CLINICAL IMPRESSION: Patient is a 48 y.o. F who was seen today for physical therapy evaluation and treatment s/p R achilles tendon repair with RFHL transfer performed on 02/29/2024. Physical findings are consistent with surgery and recovery timeline as pt demonstrates decrease in bilateral ankle ROM, R>L, as well as general balance and functional mobility deficits. LEFS scores shows severe disability in performance of home ADLs and higher level community activities. Pt requires skilled PT services in order to progress post operatively, will follow protocol and progress as tolerated.   OBJECTIVE IMPAIRMENTS: Abnormal gait, decreased activity tolerance, decreased balance, decreased endurance, decreased mobility, difficulty walking, decreased ROM, decreased strength, and pain  ACTIVITY LIMITATIONS: carrying, lifting, sitting, standing, squatting, sleeping, stairs, transfers, and locomotion level  PARTICIPATION LIMITATIONS: meal prep, cleaning, driving, shopping, community activity, occupation, and  yard work  PERSONAL FACTORS: Time since onset of injury/illness/exacerbation and 3+ comorbidities: Bilateral achilles sx, Fibromyalgia, HTN are also affecting patient's functional outcome.   REHAB POTENTIAL: Good  CLINICAL DECISION MAKING: Evolving/moderate complexity  EVALUATION COMPLEXITY: Moderate   GOALS: Goals reviewed with patient? No  SHORT TERM GOALS: Target date: 05/12/2024   Pt will be compliant and knowledgeable with initial HEP for improved comfort and carryover Baseline: initial HEP given  Goal status: INITIAL  2.  Pt will self report bilateral foot/ankle pain no greater than 5/10 for improved comfort and functional ability Baseline: 7/10 at worst Goal status: INITIAL   LONG TERM GOALS: Target date: 07/17/2024   Pt will improve LEFS to no less than 45/80 as proxy for functional improvement with home ADLs and higher level community activity Baseline: 33/80 Goal status: INITIAL   2.  Pt will self report bilateral foot/ankle pain no greater than 3/10 for improved comfort and functional ability Baseline: 7/10 at worst Goal status: INITIAL   3.  Pt will be able to hold SLS to no less than 30 seconds bilaterally for improved ankle stability post surgery Baseline: 0 seconds Goal status: INITIAL  4.  Pt will improve bilateral ankle DF to at least 10 degrees for improved functional mobility and gait Baseline: see chart Goal status: INITIAL     PLAN:  PT FREQUENCY: 2x/week  PT DURATION: 12 weeks  PLANNED INTERVENTIONS: 97164- PT Re-evaluation, 97110-Therapeutic exercises, 97530- Therapeutic activity, [id number]- Neuromuscular re-education, 97535- Self Care, [id number]- Manual therapy, [id number]- Gait training, [phone]- Electrical stimulation (unattended), [id number]- Electrical stimulation (manual), 20560 (1-2 muscles), 20561 (3+ muscles)- Dry Needling, Patient/Family education, Cryotherapy, and Moist heat  PLAN FOR NEXT SESSION: assess HEP response, progression through achilles  repair protocol    Alm JAYSON Kingdom, PT 04/24/2024, 9:17 AM

## 2024-04-24 ENCOUNTER — Other Ambulatory Visit: Payer: Self-pay

## 2024-05-01 ENCOUNTER — Other Ambulatory Visit: Payer: Self-pay | Admitting: Podiatry

## 2024-05-01 ENCOUNTER — Other Ambulatory Visit: Payer: Self-pay | Admitting: Family Medicine

## 2024-05-02 ENCOUNTER — Ambulatory Visit: Attending: Family Medicine | Admitting: Pharmacist

## 2024-05-02 ENCOUNTER — Other Ambulatory Visit: Payer: Self-pay

## 2024-05-02 ENCOUNTER — Encounter: Payer: Self-pay | Admitting: Pharmacist

## 2024-05-02 VITALS — BP 124/81 | HR 78 | Wt 243.2 lb

## 2024-05-02 DIAGNOSIS — E1169 Type 2 diabetes mellitus with other specified complication: Secondary | ICD-10-CM

## 2024-05-02 DIAGNOSIS — I10 Essential (primary) hypertension: Secondary | ICD-10-CM | POA: Diagnosis not present

## 2024-05-02 DIAGNOSIS — E1142 Type 2 diabetes mellitus with diabetic polyneuropathy: Secondary | ICD-10-CM | POA: Diagnosis not present

## 2024-05-02 DIAGNOSIS — Z7985 Long-term (current) use of injectable non-insulin antidiabetic drugs: Secondary | ICD-10-CM | POA: Diagnosis not present

## 2024-05-02 DIAGNOSIS — E785 Hyperlipidemia, unspecified: Secondary | ICD-10-CM

## 2024-05-02 LAB — POCT GLYCOSYLATED HEMOGLOBIN (HGB A1C): HbA1c, POC (controlled diabetic range): 5.7 % (ref 0.0–7.0)

## 2024-05-02 MED ORDER — IBUPROFEN 600 MG PO TABS
600.0000 mg | ORAL_TABLET | Freq: Three times a day (TID) | ORAL | 0 refills | Status: AC | PRN
  Filled 2024-05-02: qty 30, 10d supply, fill #0

## 2024-05-02 MED ORDER — TIRZEPATIDE 12.5 MG/0.5ML ~~LOC~~ SOAJ
12.5000 mg | SUBCUTANEOUS | 0 refills | Status: DC
  Filled 2024-05-02: qty 2, 28d supply, fill #0
  Filled 2024-06-05: qty 2, 28d supply, fill #1

## 2024-05-02 MED ORDER — SERTRALINE HCL 50 MG PO TABS
50.0000 mg | ORAL_TABLET | Freq: Every day | ORAL | 0 refills | Status: AC
  Filled 2024-05-02 – 2024-07-11 (×2): qty 90, 90d supply, fill #0

## 2024-05-02 NOTE — Progress Notes (Signed)
 S:     No chief complaint on file.  48 y.o. female who presents for diabetes evaluation, education, and management. PMH is significant for T2DM and HLD.   I have seen her consistently since 2021. Most recently, patient was referred by Primary Care Provider, Dr. Tanda, on 01/12/2024. Underwent successful right halgunds resection with secondary achilles tendon repair  01/21/2024. A1c pre-op was 5.8%.   When she was last seen by pharmacy on 02/14/2024, we continued her on Mounjaro  10 mg weekly. Of note, I changed her atorvastatin  to rosuvastatin  on 01/03/24.   Today, patient is in good spirits. She reports that she is tolerating the Mounjaro  10 mg okay. She denies vomiting, abdominal pain. She denies s/sx of hyper or hypoglycemia. She is using her Dexcom sensor which continues to show excellent glycemic control. She is interested in increasing Mounjaro  for weight loss benefit.   Family/Social History:  DM- daughter, brother, sister  No family or personal history of thyroid  cancer (mother has history of hyperthyroidism requiring radiation).   Current diabetes medications include: Mounjaro  10 mg weekly  Patient reports adherence to taking all medications as prescribed.  Current hyperlipidemia medications: rosuvastatin  20 mg daily  Insurance coverage: Medicaid   Patient denies hypoglycemic events.  Patient denies nocturia (nighttime urination).  Patient reports neuropathy (nerve pain). Patient denies visual changes. Patient reports self foot exams.   Dietary habits: 2 meals a day -Has increased her water  intake since last visit. Still will drink regular soda occasionally. Unable to provide estimate of water  intake.  -snack: sandwiches, tacos  Exercise:  - None S/p surgery last month.   O:  BP Readings from Last 3 Encounters:  05/02/24 124/81  03/21/24 (!) 142/85  03/08/24 (!) 152/85   Lab Results  Component Value Date   HGBA1C 5.7 05/02/2024   Vitals:   05/02/24 1004  BP:  124/81  Pulse: 78   Lipid Panel     Component Value Date/Time   CHOL 213 (H) 05/14/2023 1048   TRIG 93 05/14/2023 1048   HDL 43 05/14/2023 1048   CHOLHDL 5.0 (H) 05/14/2023 1048   LDLCALC 153 (H) 05/14/2023 1048   Clinical Atherosclerotic Cardiovascular Disease (ASCVD): No  The 10-year ASCVD risk score (Arnett DK, et al., 2019) is: 11.6%   Values used to calculate the score:     Age: 60 years     Clincally relevant sex: Female     Is Non-Hispanic African American: Yes     Diabetic: Yes     Tobacco smoker: Yes     Systolic Blood Pressure: 124 mmHg     Is BP treated: No     HDL Cholesterol: 43 mg/dL     Total Cholesterol: 213 mg/dL   Patient is participating in a Managed Medicaid Plan:  Yes   A/P: Type 2 Diabetes longstanding currently controlled based on A1c of 5.7% today. Commended her for this. Patient is tolerating Mounjaro  10 mg okay. I recommend to increase Mounjaro  to 12.5 mg weekly for now to see how she tolerates. I will see her in 6 weeks to reassess.  -INCREASE Mounjaro  to 12.5 mg weekly  -Patient educated on purpose, proper use, and potential adverse effects of medications.  -Extensively discussed pathophysiology of diabetes, recommended lifestyle interventions, dietary effects on blood sugar control.  -Counseled on s/sx of and management of hypoglycemia.  -Next A1c anticipated 07/2024.   Hyperlipidemia/ASCVD Risk Reduction: - Currently uncontrolled with last LDL-C of 153 mg/dL, above goal < 70 mg/dL. High  intensity statin indicated. We changed her to rosuvastatin  in June.  - Continue rosuvastatin  20 mg daily.  - Lipid panel   Written patient instructions provided. Patient verbalized understanding of treatment plan.  Total time in face to face counseling 30 minutes.    Follow-up:  Pharmacist: 6 weeks in-person  Herlene Fleeta Morris, PharmD, [place], CPP Clinical Pharmacist [facility] [phone]

## 2024-05-03 LAB — LIPID PANEL
Chol/HDL Ratio: 4.4 ratio (ref 0.0–4.4)
Cholesterol, Total: 219 mg/dL — ABNORMAL HIGH (ref 100–199)
HDL: 50 mg/dL (ref >39)
LDL Chol Calc (NIH): 145 mg/dL — ABNORMAL HIGH (ref 0–99)
Triglycerides: 136 mg/dL (ref 0–149)
VLDL Cholesterol Cal: 24 mg/dL (ref 5–40)

## 2024-05-03 NOTE — Therapy (Unsigned)
 OUTPATIENT PHYSICAL THERAPY TREATMENT NOTE  Patient Name: Ruth [name] MRN: [id number] DOB:10-19-1975, 48 y.o., female Today's Date: 05/05/2024  END OF SESSION:  PT End of Session - 05/05/24 0957     Visit Number 2    Number of Visits 24    Date for Recertification  07/17/24    Authorization Type [employer]    Authorization Time Period 27 VL    Authorization - Visit Number 2    Authorization - Number of Visits 27    PT Start Time 1005    PT Stop Time 1045    PT Time Calculation (min) 40 min    Activity Tolerance Patient tolerated treatment well    Behavior During Therapy WFL for tasks assessed/performed           Past Medical History:  Diagnosis Date   Abnormal uterine bleeding 2023   ADHD (attention deficit hyperactivity disorder)    Anemia 2023   Anxiety    Arthritis    chronic back pain    Asthma    rarely uses inhaler   Bipolar 1 disorder (HCC)    Follows w/ PCP Dr. Raguel Blush.   Depression    Fibromyalgia    GERD (gastroesophageal reflux disease)    Headache    Hidradenitis    multiple boils, axilla and groin (As of 08/03/22, patient has a boil in her left axilla.)   HLD (hyperlipidemia)    diet controlled   Hx of migraines    occ   Hypertension    no meds   Hypothyroidism    Intentional drug overdose (HCC) 03/28/2021   Obesity    Sleep apnea    Patient states that she had a sleep study in the past and was told she did not need a CPAP.   Thrombocytosis 01/26/2020   Type 2 diabetes mellitus (HCC)    Type 2, Follows w/ PCP Dr. Raguel Blush.   Wears glasses    Past Surgical History:  Procedure Laterality Date   ACHILLES TENDON SURGERY Left 08/13/2023   Procedure: ACHILLES TENDON REPAIR;  Surgeon: Joya Stabs, DPM;  Location: [facility];  Service: Orthopedics/Podiatry;  Laterality: Left;   ACHILLES TENDON SURGERY Right 02/29/2024   Procedure: REPAIR, TENDON, ACHILLES;  Surgeon: Joya Stabs, DPM;  Location: [facility];  Service:  Orthopedics/Podiatry;  Laterality: Right;   ANTERIOR CERVICAL DECOMP/DISCECTOMY FUSION N/A 01/16/2021   Procedure: Cervical five-six Anterior cervical decompression/discectomy/fusion;  Surgeon: Lanis Pupa, MD;  Location: [facility];  Service: Neurosurgery;  Laterality: N/A;   CALCANEAL OSTEOTOMY Right 01/21/2024   Procedure: OSTEOTOMY, CALCANEUS;  Surgeon: Joya Stabs, DPM;  Location: [facility];  Service: Orthopedics/Podiatry;  Laterality: Right;  POSTERIOR POP BLOCK   CARPAL TUNNEL RELEASE Right    around 2009   CESAREAN SECTION     x2, 2003 & 2008   CYSTOSCOPY N/A 08/12/2022   Procedure: CYSTOSCOPY;  Surgeon: Jeralyn Crutch, MD;  Location: [facility];  Service: Gynecology;  Laterality: N/A;   DILITATION & CURRETTAGE/HYSTROSCOPY WITH NOVASURE ABLATION N/A 12/07/2017   Procedure: DILATATION & CURETTAGE/HYSTEROSCOPY WITH ATTEMPED NOVASURE ABLATION, FAILED/ENDOMETERIAL ABLATION WITH HTA SYSTEM;  Surgeon: Corene Coy, MD;  Location: [facility];  Service: Gynecology;  Laterality: N/A;   ELBOW SURGERY Left    surgery for nerve pain around 2009   ENDOMETRIAL BIOPSY  07/23/2022   negative for hyperplasia or malignancy   GANGLION CYST EXCISION Right    right arm    GASTROC RECESSION EXTREMITY Right 02/29/2024  Procedure: RECESSION, TENDON, GASTROCNEMIUS;  Surgeon: Joya Stabs, DPM;  Location: [facility];  Service: Orthopedics/Podiatry;  Laterality: Right;   OSTECTOMY Left 08/13/2023   Procedure: CALCANEAL OSTECTOMY;  Surgeon: Joya Stabs, DPM;  Location: [facility];  Service: Orthopedics/Podiatry;  Laterality: Left;  POPLITEAL/SAPHENOUS BLOCK   RECTOVAGINAL FISTULA CLOSURE     in 8001,8000, 2006   ROBOTIC ASSISTED TOTAL HYSTERECTOMY Bilateral 08/12/2022   Procedure: XI ROBOTIC ASSISTED TOTAL HYSTERECTOMY WITH SALPINGECTOMY;  Surgeon: Jeralyn Crutch, MD;  Location: [facility];  Service: Gynecology;  Laterality: Bilateral;   TENDON REPAIR Right  01/21/2024   Procedure: TENDON REPAIR;  Surgeon: Joya Stabs, DPM;  Location: [facility];  Service: Orthopedics/Podiatry;  Laterality: Right;   TENDON TRANSFER Right 02/29/2024   Procedure: TRANSFER, TENDON;  Surgeon: Joya Stabs, DPM;  Location: [facility];  Service: Orthopedics/Podiatry;  Laterality: Right;  with muscle redirection   TONSILLECTOMY     childhood   TUBAL LIGATION  2008   XI ROBOT ASSISTED RECTOPEXY N/A 08/12/2022   Procedure: XI robot repair serosal repair of rectum;  Surgeon: Sheldon Standing, MD;  Location: [facility];  Service: General;  Laterality: N/A;   Patient Active Problem List   Diagnosis Date Noted   Achilles tendon rupture, right, subsequent encounter 02/29/2024   Tendonitis, Achilles, right 01/21/2024   Stenosis of cervical spine with myelopathy (HCC) 01/16/2021   BMI 45.0-49.9, adult (HCC) 09/04/2020   Right arm pain 02/14/2020   Hyperlipidemia associated with type 2 diabetes mellitus (HCC) 09/19/2019   Abnormal uterine bleeding (AUB) 12/07/2017   Abnormal mammogram of both breasts 11/02/2016   Hypothyroidism 06/21/2009   Type 2 diabetes mellitus with peripheral neuropathy (HCC) 06/21/2009   BIPOLAR AFFECTIVE DISORDER 06/21/2009   PERSISTENT DISORDER INITIATING/MAINTAINING SLEEP 06/21/2009   INADEQUATE SLEEP HYGIENE 06/21/2009   OBSTRUCTIVE SLEEP APNEA 06/21/2009   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   CARPAL TUNNEL SYNDROME 06/20/2009   MERALGIA PARESTHETICA 06/20/2009   Essential hypertension 06/20/2009   FIBROMYALGIA 06/20/2009   Headache 06/20/2009    PCP: Tanda Bleacher, MD  REFERRING PROVIDER: Joya Stabs, DPM  REFERRING DIAG:  [id number] (ICD-10-CM) - Rupture of right Achilles tendon, subsequent encounter Z98.890 (ICD-10-CM) - Status post surgery M76.61,M76.62 (ICD-10-CM) - Achilles tendonitis, bilateral  THERAPY DIAG:  Pain in right ankle and joints of right foot  Other abnormalities of gait and mobility  Muscle weakness  (generalized)  Rationale for Evaluation and Treatment: Rehabilitation  ONSET DATE: 01/21/2024 and 02/29/2024  SUBJECTIVE:   SUBJECTIVE STATEMENT: Pt presents to PT s/p R achilles tendon repair with RFHL transfer performed on 02/29/2024. She arrives in slides ambulating with axillary crutches, left CAM boot at home as she had to drive herself. Same surgery earlier this year on L side on 08/13/2023. L side is feeling better but is still very tight. Had a fall while stepping out of shower a few weeks ago.   PERTINENT HISTORY: Bilateral achilles sx, Fibromyalgia, HTN  PAIN:  Are you having pain?  Yes: NPRS scale: 5/10 Worst: 7/10 Pain location: R heel, medial ankle Pain description: sharp, sore, swollen Aggravating factors: stairs,  Relieving factors: medication, rest  PRECAUTIONS: None  RED FLAGS: None   WEIGHT BEARING RESTRICTIONS: Yes - WBAT in CAM boot until 04/24/2024 then transition to tennis shoes  FALLS:  Has patient fallen in last 6 months? Yes. Number of falls - fall while stepping out of the shower  LIVING ENVIRONMENT: Lives with: lives with their family Lives in: House/apartment Stairs: Yes: Internal: 14 steps; on  right going up Has following equipment at home: Single point cane, Crutches, and CAM boot  OCCUPATION: Advertising Copywriter   PLOF: Independent  PATIENT GOALS: decrease foot pain bilaterally, improve comfort with home ADLs and mobility, be able to work with decreased pain  NEXT MD VISIT: 05/22/2024  OBJECTIVE:  Note: Objective measures were completed at Evaluation unless otherwise noted.  DIAGNOSTIC FINDINGS: See imaging   PATIENT SURVEYS:  LEFS  Extreme difficulty/unable (0), Quite a bit of difficulty (1), Moderate difficulty (2), Little difficulty (3), No difficulty (4) Survey date:  04/21/2024  Any of your usual work, housework or school activities 3  2. Usual hobbies, recreational or sporting activities 3  3. Getting into/out of the bath 2  4.  Walking between rooms 3  5. Putting on socks/shoes 3  6. Squatting  2  7. Lifting an object, like a bag of groceries from the floor 3  8. Performing light activities around your home 3  9. Performing heavy activities around your home 1  10. Getting into/out of a car 2  11. Walking 2 blocks 1  12. Walking 1 mile 1  13. Going up/down 10 stairs (1 flight) 1  14. Standing for 1 hour 0  15. Sitting for 1 hour 4  16. Running on even ground 0  17. Running on uneven ground 0  18. Making sharp turns while running fast 0  19. Hopping  0  20. Rolling over in bed 1  Score total:  33/80     COGNITION: Overall cognitive status: Within functional limits for tasks assessed     SENSATION: WFL  POSTURE: rounded shoulders and forward head  PALPATION: Slight TTP to bilateral heels, keloided scar L ankle sx incision  LOWER EXTREMITY ROM:  Active ROM Right eval Left eval  Hip flexion    Hip extension    Hip abduction    Hip adduction    Hip internal rotation    Hip external rotation    Knee flexion    Knee extension    Ankle dorsiflexion -5 2  Ankle plantarflexion 35 45  Ankle inversion    Ankle eversion     (Blank rows = not tested)  LOWER EXTREMITY MMT:  MMT Right eval Left eval  Hip flexion    Hip extension    Hip abduction    Hip adduction    Hip internal rotation    Hip external rotation    Knee flexion    Knee extension    Ankle dorsiflexion    Ankle plantarflexion    Ankle inversion    Ankle eversion     (Blank rows = not tested)  LOWER EXTREMITY SPECIAL TESTS:  DNT  FUNCTIONAL TESTS:  SLS: 0 seconds bilaterally  GAIT: Distance walked: 35ft Assistive device utilized: Crutches Level of assistance: Modified independence Comments: antalgic gait R  TREATMENT: [facility] Adult PT Treatment:                                                DATE: 05/05/24 Therapeutic Exercise: Nustep  Neuromuscular re-ed: 4 way ankle YTB 15x2 Soleus heel raise 7 1/2#  50x Therapeutic Activity: Seated R hamstring stretch 30s x2 Plantar fascia stretch 60s x2 Seated INV/EV 30x over towel  [facility] Adult PT Treatment:  DATE: 04/21/2024 Therapeutic Exercise: Ankle pumps x 20 ea Ankle circles x 20 R Long sitting L ankle in/ev AROM x 10 Seated heel raise x 10  PATIENT EDUCATION:  Education details: eval findings, LEFS, heel wedges, HEP, POC Person educated: Patient Education method: Explanation, Demonstration, and Handouts Education comprehension: verbalized understanding and returned demonstration  HOME EXERCISE PROGRAM: Access Code: [email or web address] URL: [email or web address] Date: 05/05/2024 Prepared by: Nyia Tsao  Exercises - Supine Single Leg Ankle Pumps  - 2-3 x daily - 7 x weekly - 2-3 sets - 20 reps - Seated Heel Raise  - 2-3 x daily - 7 x weekly - 3 sets - 10 reps - Ankle Inversion with Resistance  - 1 x daily - 5 x weekly - 2 sets - 15 reps - Ankle Eversion with Resistance  - 1 x daily - 5 x weekly - 2 sets - 15 reps - Ankle Dorsiflexion with Resistance  - 1 x daily - 5 x weekly - 2 sets - 15 reps - Medial Ankle Scar Massage  - 1 x daily - 5 x weekly  ASSESSMENT:  CLINICAL IMPRESSION:  patient returns for first f/u session.  Has been compliant with HEP.  Ambulating in slides as they are the most comfortable and do not irritate her scar.  Session focused on PROM and gradually introduced isotonic strengthening tasks to load tendon.  Has concerns over L ankle as she had same procedure but did not have OPPT.  Good tolerance to today's activities and will progress as tolerated.   Patient is a 48 y.o. F who was seen today for physical therapy evaluation and treatment s/p R achilles tendon repair with RFHL transfer performed on 02/29/2024. Physical findings are consistent with surgery and recovery timeline as pt demonstrates decrease in bilateral ankle ROM, R>L, as well as general balance and  functional mobility deficits. LEFS scores shows severe disability in performance of home ADLs and higher level community activities. Pt requires skilled PT services in order to progress post operatively, will follow protocol and progress as tolerated.   OBJECTIVE IMPAIRMENTS: Abnormal gait, decreased activity tolerance, decreased balance, decreased endurance, decreased mobility, difficulty walking, decreased ROM, decreased strength, and pain  ACTIVITY LIMITATIONS: carrying, lifting, sitting, standing, squatting, sleeping, stairs, transfers, and locomotion level  PARTICIPATION LIMITATIONS: meal prep, cleaning, driving, shopping, community activity, occupation, and yard work  PERSONAL FACTORS: Time since onset of injury/illness/exacerbation and 3+ comorbidities: Bilateral achilles sx, Fibromyalgia, HTN are also affecting patient's functional outcome.   REHAB POTENTIAL: Good  CLINICAL DECISION MAKING: Evolving/moderate complexity  EVALUATION COMPLEXITY: Moderate   GOALS: Goals reviewed with patient? No  SHORT TERM GOALS: Target date: 05/12/2024   Pt will be compliant and knowledgeable with initial HEP for improved comfort and carryover Baseline: initial HEP given  Goal status: INITIAL  2.  Pt will self report bilateral foot/ankle pain no greater than 5/10 for improved comfort and functional ability Baseline: 7/10 at worst Goal status: INITIAL   LONG TERM GOALS: Target date: 07/17/2024   Pt will improve LEFS to no less than 45/80 as proxy for functional improvement with home ADLs and higher level community activity Baseline: 33/80 Goal status: INITIAL   2.  Pt will self report bilateral foot/ankle pain no greater than 3/10 for improved comfort and functional ability Baseline: 7/10 at worst Goal status: INITIAL   3.  Pt will be able to hold SLS to no less than 30 seconds bilaterally for improved ankle stability post surgery Baseline: 0 seconds Goal status:  INITIAL  4.  Pt will  improve bilateral ankle DF to at least 10 degrees for improved functional mobility and gait Baseline: see chart Goal status: INITIAL     PLAN:  PT FREQUENCY: 2x/week  PT DURATION: 12 weeks  PLANNED INTERVENTIONS: [id number]- PT Re-evaluation, 97110-Therapeutic exercises, 97530- Therapeutic activity, [id number]- Neuromuscular re-education, 97535- Self Care, [id number]- Manual therapy, [id number]- Gait training, [phone]- Electrical stimulation (unattended), [id number]- Electrical stimulation (manual), 20560 (1-2 muscles), 20561 (3+ muscles)- Dry Needling, Patient/Family education, Cryotherapy, and Moist heat  PLAN FOR NEXT SESSION: assess HEP response, progression through achilles repair protocol    Reyes CHRISTELLA Kohut, PT 05/05/2024, 11:18 AM

## 2024-05-04 ENCOUNTER — Ambulatory Visit: Payer: Self-pay | Admitting: Family Medicine

## 2024-05-05 ENCOUNTER — Ambulatory Visit

## 2024-05-05 DIAGNOSIS — M25571 Pain in right ankle and joints of right foot: Secondary | ICD-10-CM | POA: Diagnosis not present

## 2024-05-05 DIAGNOSIS — M6281 Muscle weakness (generalized): Secondary | ICD-10-CM

## 2024-05-05 DIAGNOSIS — R2689 Other abnormalities of gait and mobility: Secondary | ICD-10-CM

## 2024-05-11 NOTE — Therapy (Deleted)
 OUTPATIENT PHYSICAL THERAPY TREATMENT NOTE  Patient Name: Danikah Budzik MRN: [id number] DOB:07-15-1975, 48 y.o., female Today's Date: 05/11/2024  END OF SESSION:     Past Medical History:  Diagnosis Date   Abnormal uterine bleeding 2023   ADHD (attention deficit hyperactivity disorder)    Anemia 2023   Anxiety    Arthritis    chronic back pain    Asthma    rarely uses inhaler   Bipolar 1 disorder (HCC)    Follows w/ PCP Dr. Raguel Blush.   Depression    Fibromyalgia    GERD (gastroesophageal reflux disease)    Headache    Hidradenitis    multiple boils, axilla and groin (As of 08/03/22, patient has a boil in her left axilla.)   HLD (hyperlipidemia)    diet controlled   Hx of migraines    occ   Hypertension    no meds   Hypothyroidism    Intentional drug overdose (HCC) 03/28/2021   Obesity    Sleep apnea    Patient states that she had a sleep study in the past and was told she did not need a CPAP.   Thrombocytosis 01/26/2020   Type 2 diabetes mellitus (HCC)    Type 2, Follows w/ PCP Dr. Raguel Blush.   Wears glasses    Past Surgical History:  Procedure Laterality Date   ACHILLES TENDON SURGERY Left 08/13/2023   Procedure: ACHILLES TENDON REPAIR;  Surgeon: Joya Stabs, DPM;  Location: [facility];  Service: Orthopedics/Podiatry;  Laterality: Left;   ACHILLES TENDON SURGERY Right 02/29/2024   Procedure: REPAIR, TENDON, ACHILLES;  Surgeon: Joya Stabs, DPM;  Location: [facility];  Service: Orthopedics/Podiatry;  Laterality: Right;   ANTERIOR CERVICAL DECOMP/DISCECTOMY FUSION N/A 01/16/2021   Procedure: Cervical five-six Anterior cervical decompression/discectomy/fusion;  Surgeon: Lanis Pupa, MD;  Location: [facility];  Service: Neurosurgery;  Laterality: N/A;   CALCANEAL OSTEOTOMY Right 01/21/2024   Procedure: OSTEOTOMY, CALCANEUS;  Surgeon: Joya Stabs, DPM;  Location: [facility];  Service: Orthopedics/Podiatry;  Laterality: Right;  POSTERIOR POP BLOCK    CARPAL TUNNEL RELEASE Right    around 2009   CESAREAN SECTION     x2, 2003 & 2008   CYSTOSCOPY N/A 08/12/2022   Procedure: CYSTOSCOPY;  Surgeon: Jeralyn Crutch, MD;  Location: [facility];  Service: Gynecology;  Laterality: N/A;   DILITATION & CURRETTAGE/HYSTROSCOPY WITH NOVASURE ABLATION N/A 12/07/2017   Procedure: DILATATION & CURETTAGE/HYSTEROSCOPY WITH ATTEMPED NOVASURE ABLATION, FAILED/ENDOMETERIAL ABLATION WITH HTA SYSTEM;  Surgeon: Corene Coy, MD;  Location: [facility];  Service: Gynecology;  Laterality: N/A;   ELBOW SURGERY Left    surgery for nerve pain around 2009   ENDOMETRIAL BIOPSY  07/23/2022   negative for hyperplasia or malignancy   GANGLION CYST EXCISION Right    right arm    GASTROC RECESSION EXTREMITY Right 02/29/2024   Procedure: RECESSION, TENDON, GASTROCNEMIUS;  Surgeon: Joya Stabs, DPM;  Location: [facility];  Service: Orthopedics/Podiatry;  Laterality: Right;   OSTECTOMY Left 08/13/2023   Procedure: CALCANEAL OSTECTOMY;  Surgeon: Joya Stabs, DPM;  Location: [facility];  Service: Orthopedics/Podiatry;  Laterality: Left;  POPLITEAL/SAPHENOUS BLOCK   RECTOVAGINAL FISTULA CLOSURE     in 8001,8000, 2006   ROBOTIC ASSISTED TOTAL HYSTERECTOMY Bilateral 08/12/2022   Procedure: XI ROBOTIC ASSISTED TOTAL HYSTERECTOMY WITH SALPINGECTOMY;  Surgeon: Jeralyn Crutch, MD;  Location: [facility];  Service: Gynecology;  Laterality: Bilateral;   TENDON REPAIR Right 01/21/2024   Procedure: TENDON REPAIR;  Surgeon: Joya Stabs, DPM;  Location: [facility];  Service: Orthopedics/Podiatry;  Laterality: Right;   TENDON TRANSFER Right 02/29/2024   Procedure: TRANSFER, TENDON;  Surgeon: Joya Stabs, DPM;  Location: [facility];  Service: Orthopedics/Podiatry;  Laterality: Right;  with muscle redirection   TONSILLECTOMY     childhood   TUBAL LIGATION  2008   XI ROBOT ASSISTED RECTOPEXY N/A 08/12/2022   Procedure: XI robot repair serosal repair  of rectum;  Surgeon: Sheldon Standing, MD;  Location: [facility];  Service: General;  Laterality: N/A;   Patient Active Problem List   Diagnosis Date Noted   Achilles tendon rupture, right, subsequent encounter 02/29/2024   Tendonitis, Achilles, right 01/21/2024   Stenosis of cervical spine with myelopathy (HCC) 01/16/2021   BMI 45.0-49.9, adult (HCC) 09/04/2020   Right arm pain 02/14/2020   Hyperlipidemia associated with type 2 diabetes mellitus (HCC) 09/19/2019   Abnormal uterine bleeding (AUB) 12/07/2017   Abnormal mammogram of both breasts 11/02/2016   Hypothyroidism 06/21/2009   Type 2 diabetes mellitus with peripheral neuropathy (HCC) 06/21/2009   BIPOLAR AFFECTIVE DISORDER 06/21/2009   PERSISTENT DISORDER INITIATING/MAINTAINING SLEEP 06/21/2009   INADEQUATE SLEEP HYGIENE 06/21/2009   OBSTRUCTIVE SLEEP APNEA 06/21/2009   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   CARPAL TUNNEL SYNDROME 06/20/2009   MERALGIA PARESTHETICA 06/20/2009   Essential hypertension 06/20/2009   FIBROMYALGIA 06/20/2009   Headache 06/20/2009    PCP: Tanda Bleacher, MD  REFERRING PROVIDER: Joya Stabs, DPM  REFERRING DIAG:  [phone] (ICD-10-CM) - Rupture of right Achilles tendon, subsequent encounter Z98.890 (ICD-10-CM) - Status post surgery M76.61,M76.62 (ICD-10-CM) - Achilles tendonitis, bilateral  THERAPY DIAG:  No diagnosis found.  Rationale for Evaluation and Treatment: Rehabilitation  ONSET DATE: 01/21/2024 and 02/29/2024  SUBJECTIVE:   SUBJECTIVE STATEMENT: Pt presents to PT s/p R achilles tendon repair with RFHL transfer performed on 02/29/2024. She arrives in slides ambulating with axillary crutches, left CAM boot at home as she had to drive herself. Same surgery earlier this year on L side on 08/13/2023. L side is feeling better but is still very tight. Had a fall while stepping out of shower a few weeks ago.   PERTINENT HISTORY: Bilateral achilles sx, Fibromyalgia, HTN  PAIN:   Are you having pain?  Yes: NPRS scale: 5/10 Worst: 7/10 Pain location: R heel, medial ankle Pain description: sharp, sore, swollen Aggravating factors: stairs,  Relieving factors: medication, rest  PRECAUTIONS: None  RED FLAGS: None   WEIGHT BEARING RESTRICTIONS: Yes - WBAT in CAM boot until 04/24/2024 then transition to tennis shoes  FALLS:  Has patient fallen in last 6 months? Yes. Number of falls - fall while stepping out of the shower  LIVING ENVIRONMENT: Lives with: lives with their family Lives in: House/apartment Stairs: Yes: Internal: 14 steps; on right going up Has following equipment at home: Single point cane, Crutches, and CAM boot  OCCUPATION: Advertising Copywriter   PLOF: Independent  PATIENT GOALS: decrease foot pain bilaterally, improve comfort with home ADLs and mobility, be able to work with decreased pain  NEXT MD VISIT: 05/22/2024  OBJECTIVE:  Note: Objective measures were completed at Evaluation unless otherwise noted.  DIAGNOSTIC FINDINGS: See imaging   PATIENT SURVEYS:  LEFS  Extreme difficulty/unable (0), Quite a bit of difficulty (1), Moderate difficulty (2), Little difficulty (3), No difficulty (4) Survey date:  04/21/2024  Any of your usual work, housework or school activities 3  2. Usual hobbies, recreational or sporting activities 3  3. Getting into/out of the bath 2  4. Walking  between rooms 3  5. Putting on socks/shoes 3  6. Squatting  2  7. Lifting an object, like a bag of groceries from the floor 3  8. Performing light activities around your home 3  9. Performing heavy activities around your home 1  10. Getting into/out of a car 2  11. Walking 2 blocks 1  12. Walking 1 mile 1  13. Going up/down 10 stairs (1 flight) 1  14. Standing for 1 hour 0  15. Sitting for 1 hour 4  16. Running on even ground 0  17. Running on uneven ground 0  18. Making sharp turns while running fast 0  19. Hopping  0  20. Rolling over in bed 1  Score  total:  33/80     COGNITION: Overall cognitive status: Within functional limits for tasks assessed     SENSATION: WFL  POSTURE: rounded shoulders and forward head  PALPATION: Slight TTP to bilateral heels, keloided scar L ankle sx incision  LOWER EXTREMITY ROM:  Active ROM Right eval Left eval  Hip flexion    Hip extension    Hip abduction    Hip adduction    Hip internal rotation    Hip external rotation    Knee flexion    Knee extension    Ankle dorsiflexion -5 2  Ankle plantarflexion 35 45  Ankle inversion    Ankle eversion     (Blank rows = not tested)  LOWER EXTREMITY MMT:  MMT Right eval Left eval  Hip flexion    Hip extension    Hip abduction    Hip adduction    Hip internal rotation    Hip external rotation    Knee flexion    Knee extension    Ankle dorsiflexion    Ankle plantarflexion    Ankle inversion    Ankle eversion     (Blank rows = not tested)  LOWER EXTREMITY SPECIAL TESTS:  DNT  FUNCTIONAL TESTS:  SLS: 0 seconds bilaterally  GAIT: Distance walked: 23ft Assistive device utilized: Crutches Level of assistance: Modified independence Comments: antalgic gait R  TREATMENT: [facility] Adult PT Treatment:                                                DATE: 05/05/24 Therapeutic Exercise: Nustep  Neuromuscular re-ed: 4 way ankle YTB 15x2 Soleus heel raise 7 1/2# 50x Therapeutic Activity: Seated R hamstring stretch 30s x2 Plantar fascia stretch 60s x2 Seated INV/EV 30x over towel  [facility] Adult PT Treatment:                                                DATE: 04/21/2024 Therapeutic Exercise: Ankle pumps x 20 ea Ankle circles x 20 R Long sitting L ankle in/ev AROM x 10 Seated heel raise x 10  PATIENT EDUCATION:  Education details: eval findings, LEFS, heel wedges, HEP, POC Person educated: Patient Education method: Explanation, Demonstration, and Handouts Education comprehension: verbalized understanding and returned  demonstration  HOME EXERCISE PROGRAM: Access Code: [email or web address] URL: [email or web address] Date: 05/05/2024 Prepared by: Reyes Kohut  Exercises - Supine Single Leg Ankle Pumps  - 2-3 x daily - 7 x weekly - 2-3 sets - 20 reps -  Seated Heel Raise  - 2-3 x daily - 7 x weekly - 3 sets - 10 reps - Ankle Inversion with Resistance  - 1 x daily - 5 x weekly - 2 sets - 15 reps - Ankle Eversion with Resistance  - 1 x daily - 5 x weekly - 2 sets - 15 reps - Ankle Dorsiflexion with Resistance  - 1 x daily - 5 x weekly - 2 sets - 15 reps - Medial Ankle Scar Massage  - 1 x daily - 5 x weekly  ASSESSMENT:  CLINICAL IMPRESSION:  patient returns for first f/u session.  Has been compliant with HEP.  Ambulating in slides as they are the most comfortable and do not irritate her scar.  Session focused on PROM and gradually introduced isotonic strengthening tasks to load tendon.  Has concerns over L ankle as she had same procedure but did not have OPPT.  Good tolerance to today's activities and will progress as tolerated.   Patient is a 48 y.o. F who was seen today for physical therapy evaluation and treatment s/p R achilles tendon repair with RFHL transfer performed on 02/29/2024. Physical findings are consistent with surgery and recovery timeline as pt demonstrates decrease in bilateral ankle ROM, R>L, as well as general balance and functional mobility deficits. LEFS scores shows severe disability in performance of home ADLs and higher level community activities. Pt requires skilled PT services in order to progress post operatively, will follow protocol and progress as tolerated.   OBJECTIVE IMPAIRMENTS: Abnormal gait, decreased activity tolerance, decreased balance, decreased endurance, decreased mobility, difficulty walking, decreased ROM, decreased strength, and pain  ACTIVITY LIMITATIONS: carrying, lifting, sitting, standing, squatting, sleeping, stairs, transfers, and locomotion  level  PARTICIPATION LIMITATIONS: meal prep, cleaning, driving, shopping, community activity, occupation, and yard work  PERSONAL FACTORS: Time since onset of injury/illness/exacerbation and 3+ comorbidities: Bilateral achilles sx, Fibromyalgia, HTN are also affecting patient's functional outcome.   REHAB POTENTIAL: Good  CLINICAL DECISION MAKING: Evolving/moderate complexity  EVALUATION COMPLEXITY: Moderate   GOALS: Goals reviewed with patient? No  SHORT TERM GOALS: Target date: 05/12/2024   Pt will be compliant and knowledgeable with initial HEP for improved comfort and carryover Baseline: initial HEP given  Goal status: INITIAL  2.  Pt will self report bilateral foot/ankle pain no greater than 5/10 for improved comfort and functional ability Baseline: 7/10 at worst Goal status: INITIAL   LONG TERM GOALS: Target date: 07/17/2024   Pt will improve LEFS to no less than 45/80 as proxy for functional improvement with home ADLs and higher level community activity Baseline: 33/80 Goal status: INITIAL   2.  Pt will self report bilateral foot/ankle pain no greater than 3/10 for improved comfort and functional ability Baseline: 7/10 at worst Goal status: INITIAL   3.  Pt will be able to hold SLS to no less than 30 seconds bilaterally for improved ankle stability post surgery Baseline: 0 seconds Goal status: INITIAL  4.  Pt will improve bilateral ankle DF to at least 10 degrees for improved functional mobility and gait Baseline: see chart Goal status: INITIAL     PLAN:  PT FREQUENCY: 2x/week  PT DURATION: 12 weeks  PLANNED INTERVENTIONS: [id number]- PT Re-evaluation, 97110-Therapeutic exercises, 97530- Therapeutic activity, [id number]- Neuromuscular re-education, 97535- Self Care, [id number]- Manual therapy, [id number]- Gait training, [id number]- Electrical stimulation (unattended), [id number]- Electrical stimulation (manual), 20560 (1-2 muscles), 20561 (3+ muscles)- Dry Needling, Patient/Family  education, Cryotherapy, and Moist heat  PLAN FOR NEXT SESSION: assess HEP response,  progression through achilles repair protocol    Bonnie Overdorf M Len Azeez, PT 05/11/2024, 2:59 PM

## 2024-05-12 ENCOUNTER — Ambulatory Visit

## 2024-05-18 ENCOUNTER — Ambulatory Visit: Admitting: Allergy & Immunology

## 2024-05-18 ENCOUNTER — Other Ambulatory Visit: Payer: Self-pay

## 2024-05-18 ENCOUNTER — Encounter: Payer: Self-pay | Admitting: Allergy & Immunology

## 2024-05-18 VITALS — BP 132/88 | HR 78 | Temp 97.5°F | Ht 63.0 in | Wt 237.0 lb

## 2024-05-18 DIAGNOSIS — J310 Chronic rhinitis: Secondary | ICD-10-CM

## 2024-05-18 DIAGNOSIS — L508 Other urticaria: Secondary | ICD-10-CM

## 2024-05-18 DIAGNOSIS — J4540 Moderate persistent asthma, uncomplicated: Secondary | ICD-10-CM | POA: Diagnosis not present

## 2024-05-18 DIAGNOSIS — L732 Hidradenitis suppurativa: Secondary | ICD-10-CM

## 2024-05-18 MED ORDER — FLUTICASONE PROPIONATE HFA 110 MCG/ACT IN AERO
2.0000 | INHALATION_SPRAY | Freq: Two times a day (BID) | RESPIRATORY_TRACT | 5 refills | Status: AC
  Filled 2024-05-18: qty 12, 30d supply, fill #0
  Filled 2024-07-03: qty 12, 30d supply, fill #1
  Filled 2024-08-05: qty 12, 30d supply, fill #2

## 2024-05-18 MED ORDER — MONTELUKAST SODIUM 10 MG PO TABS
10.0000 mg | ORAL_TABLET | Freq: Every day | ORAL | 1 refills | Status: AC
  Filled 2024-05-18: qty 90, 90d supply, fill #0

## 2024-05-18 MED ORDER — FAMOTIDINE 20 MG PO TABS
20.0000 mg | ORAL_TABLET | Freq: Two times a day (BID) | ORAL | 0 refills | Status: AC
  Filled 2024-05-18: qty 180, 90d supply, fill #0

## 2024-05-18 MED ORDER — LEVOCETIRIZINE DIHYDROCHLORIDE 5 MG PO TABS
10.0000 mg | ORAL_TABLET | Freq: Every morning | ORAL | 0 refills | Status: AC
  Filled 2024-05-18: qty 60, 30d supply, fill #0
  Filled 2024-07-03 – 2024-07-11 (×2): qty 60, 30d supply, fill #1

## 2024-05-18 MED ORDER — CETIRIZINE HCL 10 MG PO TABS
20.0000 mg | ORAL_TABLET | Freq: Every evening | ORAL | 0 refills | Status: AC
  Filled 2024-05-18: qty 60, 30d supply, fill #0
  Filled 2024-07-03: qty 60, 30d supply, fill #1
  Filled 2024-08-05: qty 60, 30d supply, fill #2

## 2024-05-18 NOTE — Therapy (Incomplete)
 OUTPATIENT PHYSICAL THERAPY TREATMENT NOTE  Patient Name: Ruth [name] MRN: [id number] DOB:09/08/1975, 48 y.o., female Today's Date: 05/18/2024  END OF SESSION:     Past Medical History:  Diagnosis Date   Abnormal uterine bleeding 2023   ADHD (attention deficit hyperactivity disorder)    Anemia 2023   Anxiety    Arthritis    chronic back pain    Asthma    rarely uses inhaler   Bipolar 1 disorder (HCC)    Follows w/ PCP Dr. Raguel Blush.   Depression    Fibromyalgia    GERD (gastroesophageal reflux disease)    Headache    Hidradenitis    multiple boils, axilla and groin (As of 08/03/22, patient has a boil in her left axilla.)   HLD (hyperlipidemia)    diet controlled   Hx of migraines    occ   Hypertension    no meds   Hypothyroidism    Intentional drug overdose (HCC) 03/28/2021   Obesity    Sleep apnea    Patient states that she had a sleep study in the past and was told she did not need a CPAP.   Thrombocytosis 01/26/2020   Type 2 diabetes mellitus (HCC)    Type 2, Follows w/ PCP Dr. Raguel Blush.   Wears glasses    Past Surgical History:  Procedure Laterality Date   ACHILLES TENDON SURGERY Left 08/13/2023   Procedure: ACHILLES TENDON REPAIR;  Surgeon: Joya Stabs, DPM;  Location: [facility];  Service: Orthopedics/Podiatry;  Laterality: Left;   ACHILLES TENDON SURGERY Right 02/29/2024   Procedure: REPAIR, TENDON, ACHILLES;  Surgeon: Joya Stabs, DPM;  Location: [facility];  Service: Orthopedics/Podiatry;  Laterality: Right;   ANTERIOR CERVICAL DECOMP/DISCECTOMY FUSION N/A 01/16/2021   Procedure: Cervical five-six Anterior cervical decompression/discectomy/fusion;  Surgeon: Lanis Pupa, MD;  Location: [facility];  Service: Neurosurgery;  Laterality: N/A;   CALCANEAL OSTEOTOMY Right 01/21/2024   Procedure: OSTEOTOMY, CALCANEUS;  Surgeon: Joya Stabs, DPM;  Location: [facility];  Service: Orthopedics/Podiatry;  Laterality: Right;  POSTERIOR POP BLOCK    CARPAL TUNNEL RELEASE Right    around 2009   CESAREAN SECTION     x2, 2003 & 2008   CYSTOSCOPY N/A 08/12/2022   Procedure: CYSTOSCOPY;  Surgeon: Jeralyn Crutch, MD;  Location: [facility];  Service: Gynecology;  Laterality: N/A;   DILITATION & CURRETTAGE/HYSTROSCOPY WITH NOVASURE ABLATION N/A 12/07/2017   Procedure: DILATATION & CURETTAGE/HYSTEROSCOPY WITH ATTEMPED NOVASURE ABLATION, FAILED/ENDOMETERIAL ABLATION WITH HTA SYSTEM;  Surgeon: Corene Coy, MD;  Location: [facility];  Service: Gynecology;  Laterality: N/A;   ELBOW SURGERY Left    surgery for nerve pain around 2009   ENDOMETRIAL BIOPSY  07/23/2022   negative for hyperplasia or malignancy   GANGLION CYST EXCISION Right    right arm    GASTROC RECESSION EXTREMITY Right 02/29/2024   Procedure: RECESSION, TENDON, GASTROCNEMIUS;  Surgeon: Joya Stabs, DPM;  Location: [facility];  Service: Orthopedics/Podiatry;  Laterality: Right;   OSTECTOMY Left 08/13/2023   Procedure: CALCANEAL OSTECTOMY;  Surgeon: Joya Stabs, DPM;  Location: [facility];  Service: Orthopedics/Podiatry;  Laterality: Left;  POPLITEAL/SAPHENOUS BLOCK   RECTOVAGINAL FISTULA CLOSURE     in 8001,8000, 2006   ROBOTIC ASSISTED TOTAL HYSTERECTOMY Bilateral 08/12/2022   Procedure: XI ROBOTIC ASSISTED TOTAL HYSTERECTOMY WITH SALPINGECTOMY;  Surgeon: Jeralyn Crutch, MD;  Location:  SURGERY CENTER;  Service: Gynecology;  Laterality: Bilateral;   TENDON REPAIR Right 01/21/2024   Procedure: TENDON REPAIR;  Surgeon: Joya Stabs, DPM;  Location: [facility];  Service: Orthopedics/Podiatry;  Laterality: Right;   TENDON TRANSFER Right 02/29/2024   Procedure: TRANSFER, TENDON;  Surgeon: Joya Stabs, DPM;  Location: [facility];  Service: Orthopedics/Podiatry;  Laterality: Right;  with muscle redirection   TONSILLECTOMY     childhood   TUBAL LIGATION  2008   XI ROBOT ASSISTED RECTOPEXY N/A 08/12/2022   Procedure: XI robot repair serosal repair  of rectum;  Surgeon: Sheldon Standing, MD;  Location: [facility];  Service: General;  Laterality: N/A;   Patient Active Problem List   Diagnosis Date Noted   Achilles tendon rupture, right, subsequent encounter 02/29/2024   Tendonitis, Achilles, right 01/21/2024   Stenosis of cervical spine with myelopathy (HCC) 01/16/2021   BMI 45.0-49.9, adult (HCC) 09/04/2020   Right arm pain 02/14/2020   Hyperlipidemia associated with type 2 diabetes mellitus (HCC) 09/19/2019   Abnormal uterine bleeding (AUB) 12/07/2017   Abnormal mammogram of both breasts 11/02/2016   Hypothyroidism 06/21/2009   Type 2 diabetes mellitus with peripheral neuropathy (HCC) 06/21/2009   BIPOLAR AFFECTIVE DISORDER 06/21/2009   PERSISTENT DISORDER INITIATING/MAINTAINING SLEEP 06/21/2009   INADEQUATE SLEEP HYGIENE 06/21/2009   OBSTRUCTIVE SLEEP APNEA 06/21/2009   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   CARPAL TUNNEL SYNDROME 06/20/2009   MERALGIA PARESTHETICA 06/20/2009   Essential hypertension 06/20/2009   FIBROMYALGIA 06/20/2009   Headache 06/20/2009    PCP: Tanda Bleacher, MD  REFERRING PROVIDER: Joya Stabs, DPM  REFERRING DIAG:  [phone] (ICD-10-CM) - Rupture of right Achilles tendon, subsequent encounter Z98.890 (ICD-10-CM) - Status post surgery M76.61,M76.62 (ICD-10-CM) - Achilles tendonitis, bilateral  THERAPY DIAG:  No diagnosis found.  Rationale for Evaluation and Treatment: Rehabilitation  ONSET DATE: 01/21/2024 and 02/29/2024  SUBJECTIVE:   SUBJECTIVE STATEMENT: Pt presents to PT s/p R achilles tendon repair with RFHL transfer performed on 02/29/2024. She arrives in slides ambulating with axillary crutches, left CAM boot at home as she had to drive herself. Same surgery earlier this year on L side on 08/13/2023. L side is feeling better but is still very tight. Had a fall while stepping out of shower a few weeks ago.   PERTINENT HISTORY: Bilateral achilles sx, Fibromyalgia, HTN  PAIN:   Are you having pain?  Yes: NPRS scale: 5/10 Worst: 7/10 Pain location: R heel, medial ankle Pain description: sharp, sore, swollen Aggravating factors: stairs,  Relieving factors: medication, rest  PRECAUTIONS:  Phase IV (7 wks-12 wks): Wean to regular shoe Weight bearing: Full Brace: Regular shoe with bilateral single heel lift. Can remove heel lifts at  10 weeks. Barefoot activities after 12 weeks. ROM: No limitation on active ROM to comfort. Exercises: Active ankle exercises for ROM. Gait training. Standing heel  rise (50% on injured side only). Continue above exercises.   Phase V (12 wks to 6 mo): Advanced activities Weight bearing: Full Brace: None ROM: No limitation Exercises: Intensify exercises above. Increase the load gradually from  two leg standing heel rises to one leg. Start gentle jogging at 5 months in  controlled settings, ideally treadmill.        RED FLAGS: None   WEIGHT BEARING RESTRICTIONS: Yes - WBAT in CAM boot until 04/24/2024 then transition to tennis shoes  FALLS:  Has patient fallen in last 6 months? Yes. Number of falls - fall while stepping out of the shower  LIVING ENVIRONMENT: Lives with: lives with their family Lives in: House/apartment Stairs: Yes: Internal: 14 steps; on right going up Has following equipment at home: Single point cane, Crutches,  and CAM boot  OCCUPATION: Advertising Copywriter   PLOF: Independent  PATIENT GOALS: decrease foot pain bilaterally, improve comfort with home ADLs and mobility, be able to work with decreased pain  NEXT MD VISIT: 05/22/2024  OBJECTIVE:  Note: Objective measures were completed at Evaluation unless otherwise noted.  DIAGNOSTIC FINDINGS: See imaging   PATIENT SURVEYS:  LEFS  Extreme difficulty/unable (0), Quite a bit of difficulty (1), Moderate difficulty (2), Little difficulty (3), No difficulty (4) Survey date:  04/21/2024  Any of your usual work, housework or school activities 3  2.  Usual hobbies, recreational or sporting activities 3  3. Getting into/out of the bath 2  4. Walking between rooms 3  5. Putting on socks/shoes 3  6. Squatting  2  7. Lifting an object, like a bag of groceries from the floor 3  8. Performing light activities around your home 3  9. Performing heavy activities around your home 1  10. Getting into/out of a car 2  11. Walking 2 blocks 1  12. Walking 1 mile 1  13. Going up/down 10 stairs (1 flight) 1  14. Standing for 1 hour 0  15. Sitting for 1 hour 4  16. Running on even ground 0  17. Running on uneven ground 0  18. Making sharp turns while running fast 0  19. Hopping  0  20. Rolling over in bed 1  Score total:  33/80     COGNITION: Overall cognitive status: Within functional limits for tasks assessed     SENSATION: WFL  POSTURE: rounded shoulders and forward head  PALPATION: Slight TTP to bilateral heels, keloided scar L ankle sx incision  LOWER EXTREMITY ROM:  Active ROM Right eval Left eval  Hip flexion    Hip extension    Hip abduction    Hip adduction    Hip internal rotation    Hip external rotation    Knee flexion    Knee extension    Ankle dorsiflexion -5 2  Ankle plantarflexion 35 45  Ankle inversion    Ankle eversion     (Blank rows = not tested)  LOWER EXTREMITY MMT:  MMT Right eval Left eval  Hip flexion    Hip extension    Hip abduction    Hip adduction    Hip internal rotation    Hip external rotation    Knee flexion    Knee extension    Ankle dorsiflexion    Ankle plantarflexion    Ankle inversion    Ankle eversion     (Blank rows = not tested)  LOWER EXTREMITY SPECIAL TESTS:  DNT  FUNCTIONAL TESTS:  SLS: 0 seconds bilaterally  GAIT: Distance walked: 9ft Assistive device utilized: Crutches Level of assistance: Modified independence Comments: antalgic gait R  TREATMENT: [facility] Adult PT Treatment:                                                DATE: 05/05/24 Therapeutic  Exercise: Nustep  Neuromuscular re-ed: 4 way ankle YTB 15x2 Soleus heel raise 7 1/2# 50x Therapeutic Activity: Seated R hamstring stretch 30s x2 Plantar fascia stretch 60s x2 Seated INV/EV 30x over towel  [facility] Adult PT Treatment:  DATE: 04/21/2024 Therapeutic Exercise: Ankle pumps x 20 ea Ankle circles x 20 R Long sitting L ankle in/ev AROM x 10 Seated heel raise x 10  PATIENT EDUCATION:  Education details: eval findings, LEFS, heel wedges, HEP, POC Person educated: Patient Education method: Explanation, Demonstration, and Handouts Education comprehension: verbalized understanding and returned demonstration  HOME EXERCISE PROGRAM: Access Code: [email or web address] URL: [email or web address] Date: 05/05/2024 Prepared by: Jeffrey Ziemba  Exercises - Supine Single Leg Ankle Pumps  - 2-3 x daily - 7 x weekly - 2-3 sets - 20 reps - Seated Heel Raise  - 2-3 x daily - 7 x weekly - 3 sets - 10 reps - Ankle Inversion with Resistance  - 1 x daily - 5 x weekly - 2 sets - 15 reps - Ankle Eversion with Resistance  - 1 x daily - 5 x weekly - 2 sets - 15 reps - Ankle Dorsiflexion with Resistance  - 1 x daily - 5 x weekly - 2 sets - 15 reps - Medial Ankle Scar Massage  - 1 x daily - 5 x weekly  ASSESSMENT:  CLINICAL IMPRESSION:  patient returns for first f/u session.  Has been compliant with HEP.  Ambulating in slides as they are the most comfortable and do not irritate her scar.  Session focused on PROM and gradually introduced isotonic strengthening tasks to load tendon.  Has concerns over L ankle as she had same procedure but did not have OPPT.  Good tolerance to today's activities and will progress as tolerated.   Patient is a 48 y.o. F who was seen today for physical therapy evaluation and treatment s/p R achilles tendon repair with RFHL transfer performed on 02/29/2024. Physical findings are consistent with surgery and recovery timeline  as pt demonstrates decrease in bilateral ankle ROM, R>L, as well as general balance and functional mobility deficits. LEFS scores shows severe disability in performance of home ADLs and higher level community activities. Pt requires skilled PT services in order to progress post operatively, will follow protocol and progress as tolerated.   OBJECTIVE IMPAIRMENTS: Abnormal gait, decreased activity tolerance, decreased balance, decreased endurance, decreased mobility, difficulty walking, decreased ROM, decreased strength, and pain  ACTIVITY LIMITATIONS: carrying, lifting, sitting, standing, squatting, sleeping, stairs, transfers, and locomotion level  PARTICIPATION LIMITATIONS: meal prep, cleaning, driving, shopping, community activity, occupation, and yard work  PERSONAL FACTORS: Time since onset of injury/illness/exacerbation and 3+ comorbidities: Bilateral achilles sx, Fibromyalgia, HTN are also affecting patient's functional outcome.   REHAB POTENTIAL: Good  CLINICAL DECISION MAKING: Evolving/moderate complexity  EVALUATION COMPLEXITY: Moderate   GOALS: Goals reviewed with patient? No  SHORT TERM GOALS: Target date: 05/12/2024   Pt will be compliant and knowledgeable with initial HEP for improved comfort and carryover Baseline: initial HEP given  Goal status: INITIAL  2.  Pt will self report bilateral foot/ankle pain no greater than 5/10 for improved comfort and functional ability Baseline: 7/10 at worst Goal status: INITIAL   LONG TERM GOALS: Target date: 07/17/2024   Pt will improve LEFS to no less than 45/80 as proxy for functional improvement with home ADLs and higher level community activity Baseline: 33/80 Goal status: INITIAL   2.  Pt will self report bilateral foot/ankle pain no greater than 3/10 for improved comfort and functional ability Baseline: 7/10 at worst Goal status: INITIAL   3.  Pt will be able to hold SLS to no less than 30 seconds bilaterally for improved  ankle stability post surgery Baseline: 0 seconds Goal  status: INITIAL  4.  Pt will improve bilateral ankle DF to at least 10 degrees for improved functional mobility and gait Baseline: see chart Goal status: INITIAL     PLAN:  PT FREQUENCY: 2x/week  PT DURATION: 12 weeks  PLANNED INTERVENTIONS: [id number]- PT Re-evaluation, 97110-Therapeutic exercises, 97530- Therapeutic activity, [id number]- Neuromuscular re-education, 97535- Self Care, [id number]- Manual therapy, [id number]- Gait training, [phone]- Electrical stimulation (unattended), [id number]- Electrical stimulation (manual), 20560 (1-2 muscles), [id number] (3+ muscles)- Dry Needling, Patient/Family education, Cryotherapy, and Moist heat  PLAN FOR NEXT SESSION: assess HEP response, progression through achilles repair protocol    [place] Greener  Jeremian Whitby, PT 05/18/2024, 11:14 AM

## 2024-05-18 NOTE — Progress Notes (Signed)
 NEW PATIENT  Date of Service/Encounter:  05/18/24  Consult requested by: Tanda Bleacher, MD   Assessment:   Chronic rhinitis - planning for skin testing at the next visit  Moderate persistent asthma, uncomplicated  Chronic urticaria   Hidradenitis suppurativa  Plan/Recommendations:   1. Moderate persistent asthma, uncomplicated (Primary) - Lung testing looked decent today. - We are not going to make any changes today. - If you are needing to use your albuterol  frequently, we might need to consider making changes. - Daily controller medication(s): Singulair  10mg  daily and Flovent  110mcg 20 puffs twice daily with spacer - Prior to physical activity: albuterol  2 puffs 10-15 minutes before physical activity. - Rescue medications: albuterol  4 puffs every 4-6 hours as needed and albuterol  nebulizer one vial every 4-6 hours as needed - Changes during respiratory infections or worsening symptoms: Increase Flovent  to 4 puffs twice daily for TWO WEEKS. - Asthma control goals:  * Full participation in all desired activities (may need albuterol  before activity) * Albuterol  use two time or less a week on average (not counting use with activity) * Cough interfering with sleep two time or less a month * Oral steroids no more than once a year * No hospitalizations  2. Chronic rhinitis - Because of insurance stipulations, we cannot do skin testing on the same day as your first visit. - We are all working to fight this, but for now we need to do two separate visits.  - We will know more after we do testing at the next visit.  - The skin testing visit can be squeezed in at your convenience.  - Then we can make a more full plan to address all of your symptoms. - Be sure to stop your antihistamines for 3 days before this appointment.   3. Chronic urticaria - Your history does not have any red flags such as fevers, joint pains, or permanent skin changes that would be concerning for a more  serious cause of hives.  - We will get some labs to rule out serious causes of hives: alpha gal panel, complete blood count, tryptase level, chronic urticaria panel, CMP, ESR, and CRP. - Chronic hives are often times a self limited process and will burn themselves out over 6-12 months, although this is not always the case.  - In the meantime, start suppressive dosing of antihistamines:   - Morning: Xyzal (levocetirizine) 10mg  (two tablets) + Pepcid (famotidine) 20mg   - Evening: Zyrtec  (cetirizine ) 20mg  (two tablets) + Pepcid (famotidine) 20mg  - You can change this dosing at home, decreasing the dose as needed or increasing the dosing as needed.  - If you are not tolerating the medications or are tired of taking them every day, we can start treatment with a monthly injectable medication called Xolair or Dupixent.   4. Hidradenitis suppurativa - We could send you to see a Dermatologist who specializes in this condition if you are interested.  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   5. Return in about 1 week (around 05/25/2024) for SKIN TESTING (1-68). You can have the follow up appointment with Dr. Iva or a Nurse Practicioner (our Nurse Practitioners are excellent and always have Physician oversight!).   This note in its entirety was forwarded to the Provider who requested this consultation.  Subjective:   Ruth [name] is a 48 y.o. female presenting today for evaluation of  Chief Complaint  Patient presents with   Rash    Patient  in room #12 with her son. Pt stated she has hives head to toe.    Ruth [name] has a history of the following: Patient Active Problem List   Diagnosis Date Noted   Achilles tendon rupture, right, subsequent encounter 02/29/2024   Tendonitis, Achilles, right 01/21/2024   Stenosis of cervical spine with myelopathy (HCC) 01/16/2021   BMI 45.0-49.9, adult (HCC) 09/04/2020   Right arm pain 02/14/2020    Hyperlipidemia associated with type 2 diabetes mellitus (HCC) 09/19/2019   Abnormal uterine bleeding (AUB) 12/07/2017   Abnormal mammogram of both breasts 11/02/2016   Hypothyroidism 06/21/2009   Type 2 diabetes mellitus with peripheral neuropathy (HCC) 06/21/2009   BIPOLAR AFFECTIVE DISORDER 06/21/2009   PERSISTENT DISORDER INITIATING/MAINTAINING SLEEP 06/21/2009   INADEQUATE SLEEP HYGIENE 06/21/2009   OBSTRUCTIVE SLEEP APNEA 06/21/2009   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  Discussed the use of AI scribe software for clinical note transcription with the patient and/or guardian, who gave verbal consent to proceed.  Ruth [name] was referred by Tanda Bleacher, MD.     Ruth [name] is a 48 y.o. female presenting for an evaluation of urticaria and a lot of other allergic conditions as well.  Asthma/Respiratory Symptom History: She has asthma, using albuterol  as needed in both nebulizer and inhaler forms, along with a steroid inhaler, though the specific name is not recalled. Ruth [name]'s asthma has been well controlled. She has not required rescue medication, experienced nocturnal awakenings due to lower respiratory symptoms, nor have activities of daily living been limited. She has required no Emergency Department or Urgent Care visits for her asthma. She has required zero courses of systemic steroids for asthma exacerbations since the last visit. ACT score today is 25, indicating excellent asthma symptom control.   Allergic Rhinitis Symptom History: She reports seasonal allergies, managed with Zyrtec  and Flonase  for several years, experiencing sneezing, postnasal drip, and throat clearing associated with her allergies. She has never been allergy tested.   Food Allergy Symptom History:  She seems to tolerate all the major food allergens without any adverse event.  Skin Symptom History: She is getting head to toe hives.  This has been going on for a year.  They mostly happen at night, most commonly between 1 AM and 3 AM.  She will take some hydroxyzine  which she has for anxiety.  This does help a little bit.  Rashes resolve within a few hours.  She denies any fevers.  She denies any joint pain or swelling.  She has no history of rheumatoid arthritis or lupus.  She denies changes to her environment including her laundry detergent, soaps, shampoo.  She has a history of hidradenitis suppurativa, diagnosed a couple of years ago, with lesions on the stomach, private areas, and underarms, sometimes requiring surgical drainage. Treatment has included antibiotics, but she has not been on immunosuppressants.  Otherwise, there is no history of other atopic diseases, including drug allergies, stinging insect allergies, or contact dermatitis. There is no significant infectious history. Vaccinations are up to date.    Past Medical History: Patient Active Problem List   Diagnosis Date Noted   Achilles tendon rupture, right, subsequent encounter 02/29/2024   Tendonitis, Achilles, right 01/21/2024   Stenosis of cervical spine with myelopathy (HCC) 01/16/2021   BMI 45.0-49.9, adult (HCC) 09/04/2020   Right  arm pain 02/14/2020   Hyperlipidemia associated with type 2 diabetes mellitus (HCC) 09/19/2019   Abnormal uterine bleeding (AUB) 12/07/2017   Abnormal mammogram of both breasts 11/02/2016   Hypothyroidism 06/21/2009   Type 2 diabetes mellitus with peripheral neuropathy (HCC) 06/21/2009   BIPOLAR AFFECTIVE DISORDER 06/21/2009   PERSISTENT DISORDER INITIATING/MAINTAINING SLEEP 06/21/2009   INADEQUATE SLEEP HYGIENE 06/21/2009   OBSTRUCTIVE SLEEP APNEA 06/21/2009   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   CARPAL TUNNEL SYNDROME 06/20/2009   MERALGIA PARESTHETICA 06/20/2009   Essential hypertension  06/20/2009   FIBROMYALGIA 06/20/2009   Headache 06/20/2009    Medication List:  Allergies as of 05/18/2024       Reactions   Metformin  And Related Diarrhea   Penicillins Other (See Comments)   Unknown; from childhood Has patient had a PCN reaction causing immediate rash, facial/tongue/throat swelling, SOB or lightheadedness with hypotension: Unknow Has patient had a PCN reaction causing severe rash involving mucus membranes or skin necrosis: Unknown Has patient had a PCN reaction that required hospitalization: Unknown Has patient had a PCN reaction occurring within the last 10 years: Unknown If all of the above answers are NO, then may proceed with Cephalosporin use.   Victoza [liraglutide] Itching        Medication List        Accurate as of May 18, 2024 10:24 AM. If you have any questions, ask your nurse or doctor.          albuterol  108 (90 Base) MCG/ACT inhaler Commonly known as: Ventolin  HFA Inhale 2 puffs into the lungs every 4 (four) hours as needed for wheezing or shortness of breath.   albuterol  (2.5 MG/3ML) 0.083% nebulizer solution Commonly known as: PROVENTIL  Take 3 mLs (2.5 mg total) by nebulization every 6 (six) hours as needed for wheezing or shortness of breath.   aspirin  EC 81 MG tablet Take 81 mg by mouth daily. Swallow whole.   clindamycin  300 MG capsule Commonly known as: Cleocin  Take 1 capsule (300 mg total) by mouth 3 (three) times daily.   cromolyn 4 % ophthalmic solution Commonly known as: OPTICROM Place 1 drop into both eyes in the morning and at bedtime.   Dexcom G7 Sensor Misc Use to check blood sugar three time daily. Change sensors once every 10 days.   FeroSul 325 (65 Fe) MG tablet Generic drug: ferrous sulfate  Take 325 mg (1 tablet) by mouth daily.   fluticasone  110 MCG/ACT inhaler Commonly known as: Flovent  HFA Inhale 2 puffs into the lungs 2 (two) times daily.   fluticasone  50 MCG/ACT nasal spray Commonly known as:  FLONASE  Place 1 spray into both nostrils daily. What changed: when to take this   gabapentin  300 MG capsule Commonly known as: NEURONTIN  Take 1 capsule (300 mg total) by mouth at bedtime.   hydrOXYzine  25 MG capsule Commonly known as: VISTARIL  Take 1 capsule (25 mg total) by mouth every 8 (eight) hours as needed for anxiety.   ibuprofen  600 MG tablet Commonly known as: ADVIL  Take 1 tablet (600 mg total) by mouth every 8 (eight) hours as needed.   montelukast  10 MG tablet Commonly known as: SINGULAIR  Take 1 tablet (10 mg total) by mouth at bedtime.   Mounjaro  12.5 MG/0.5ML Pen Generic drug: tirzepatide  Inject 12.5 mg into the skin once a week.   omeprazole  40 MG capsule Commonly known as: PRILOSEC Take 1 capsule (40 mg total) by mouth daily.   ondansetron  4 MG tablet Commonly known as: Zofran  Take 1 tablet (4 mg  total) by mouth every 8 (eight) hours as needed for nausea or vomiting.   ondansetron  4 MG tablet Commonly known as: Zofran  Take 1 tablet (4 mg total) by mouth every 8 (eight) hours as needed for nausea or vomiting.   rosuvastatin  20 MG tablet Commonly known as: Crestor  Take 1 tablet (20 mg total) by mouth daily. What changed: when to take this   sertraline  50 MG tablet Commonly known as: Zoloft  Take 1 tablet (50 mg total) by mouth daily.   Xiidra 5 % Soln Generic drug: Lifitegrast Place 1 drop into both eyes 2 (two) times daily.        Birth History: non-contributory  Developmental History: non-contributory  Past Surgical History: Past Surgical History:  Procedure Laterality Date   ACHILLES TENDON SURGERY Left 08/13/2023   Procedure: ACHILLES TENDON REPAIR;  Surgeon: Joya Stabs, DPM;  Location: [facility];  Service: Orthopedics/Podiatry;  Laterality: Left;   ACHILLES TENDON SURGERY Right 02/29/2024   Procedure: REPAIR, TENDON, ACHILLES;  Surgeon: Joya Stabs, DPM;  Location: [facility];  Service: Orthopedics/Podiatry;  Laterality: Right;    ANTERIOR CERVICAL DECOMP/DISCECTOMY FUSION N/A 01/16/2021   Procedure: Cervical five-six Anterior cervical decompression/discectomy/fusion;  Surgeon: Lanis Pupa, MD;  [facility];  Service: Neurosurgery;  Laterality: N/A;   CALCANEAL OSTEOTOMY Right 01/21/2024   Procedure: OSTEOTOMY, CALCANEUS;  Surgeon: Joya Stabs, DPM;  Location: [facility];  Service: Orthopedics/Podiatry;  Laterality: Right;  POSTERIOR POP BLOCK   CARPAL TUNNEL RELEASE Right    around 2009   CESAREAN SECTION     x2, 2003 & 2008   CYSTOSCOPY N/A 08/12/2022   Procedure: CYSTOSCOPY;  Surgeon: Jeralyn Crutch, MD;  Location: [facility];  Service: Gynecology;  Laterality: N/A;   DILITATION & CURRETTAGE/HYSTROSCOPY WITH NOVASURE ABLATION N/A 12/07/2017   Procedure: DILATATION & CURETTAGE/HYSTEROSCOPY WITH ATTEMPED NOVASURE ABLATION, FAILED/ENDOMETERIAL ABLATION WITH HTA SYSTEM;  Surgeon: Corene Coy, MD;  Location: [facility];  Service: Gynecology;  Laterality: N/A;   ELBOW SURGERY Left    surgery for nerve pain around 2009   ENDOMETRIAL BIOPSY  07/23/2022   negative for hyperplasia or malignancy   GANGLION CYST EXCISION Right    right arm    GASTROC RECESSION EXTREMITY Right 02/29/2024   Procedure: RECESSION, TENDON, GASTROCNEMIUS;  Surgeon: Joya Stabs, DPM;  Location: [facility];  Service: Orthopedics/Podiatry;  Laterality: Right;   OSTECTOMY Left 08/13/2023   Procedure: CALCANEAL OSTECTOMY;  Surgeon: Joya Stabs, DPM;  Location: [facility];  Service: Orthopedics/Podiatry;  Laterality: Left;  POPLITEAL/SAPHENOUS BLOCK   RECTOVAGINAL FISTULA CLOSURE     in 8001,8000, 2006   ROBOTIC ASSISTED TOTAL HYSTERECTOMY Bilateral 08/12/2022   Procedure: XI ROBOTIC ASSISTED TOTAL HYSTERECTOMY WITH SALPINGECTOMY;  Surgeon: Jeralyn Crutch, MD;  Location: [facility];  Service: Gynecology;  Laterality: Bilateral;   TENDON REPAIR Right 01/21/2024   Procedure: TENDON REPAIR;  Surgeon:  Joya Stabs, DPM;  Location: [facility];  Service: Orthopedics/Podiatry;  Laterality: Right;   TENDON TRANSFER Right 02/29/2024   Procedure: TRANSFER, TENDON;  Surgeon: Joya Stabs, DPM;  Location: [facility];  Service: Orthopedics/Podiatry;  Laterality: Right;  with muscle redirection   TONSILLECTOMY     childhood   TUBAL LIGATION  2008   XI ROBOT ASSISTED RECTOPEXY N/A 08/12/2022   Procedure: XI robot repair serosal repair of rectum;  Surgeon: Sheldon Standing, MD;  Location: [facility];  Service: General;  Laterality: N/A;     Family History: Family History  Problem Relation Age of Onset  Thyroid  disease Mother    Diabetes Daughter    Diabetes Son    Breast cancer Neg Hx      Social History: Ruth [name] lives at home with her family. She has five children ages 62 up to 71. They live in an apartment.  There are wood floors throughout the apartment.  They have gas heating and central cooling.  There are 2 dogs and 1 cat in the home.  There are no dust mite covers on the bedding.  There is tobacco exposure in the house in the car.  There are no fume, chemical, or dust.  They do not live near an interstate or industrial area. There is no fume, chemical, or dust exposure.  There is no HEPA filter in the home.  [place] do not [place] an interstate on [place].  In terms of social history, she has a cat that sometimes sleeps on her bed and recently had a dog that gave birth to nine puppies, totaling eleven dogs and one cat. She grew up in [place]  and has five children, with ages ranging from 21 to 45 years old.   Review of systems otherwise negative other than that mentioned in the HPI.    Objective:   Blood pressure 132/88, pulse 78, temperature (!) 97.5 F (36.4 C), height 5' 3 (1.6 m), weight 237 lb (107.5 kg), last menstrual period 07/05/2022. Body mass index is 41.98 kg/m.     Physical Exam Vitals reviewed.  Constitutional:      Appearance: She is  well-developed.     Comments: Friendly. Talkative.   HENT:     Head: Normocephalic and atraumatic.     Right Ear: Tympanic membrane, ear canal and external ear normal. No drainage, swelling or tenderness. Tympanic membrane is not injected, scarred, erythematous, retracted or bulging.     Left Ear: Tympanic membrane, ear canal and external ear normal. No drainage, swelling or tenderness. Tympanic membrane is not injected, scarred, erythematous, retracted or bulging.     Nose: No nasal deformity, septal deviation, mucosal edema or rhinorrhea.     Right Turbinates: Enlarged and swollen.     Left Turbinates: Enlarged and swollen.     Right Sinus: No maxillary sinus tenderness or frontal sinus tenderness.     Left Sinus: No maxillary sinus tenderness or frontal sinus tenderness.     Mouth/Throat:     Lips: Pink.     Mouth: Mucous membranes are moist. Mucous membranes are not pale and not dry.     Pharynx: Uvula midline.     Comments: Mild cobblestoning present.  Eyes:     General:        Right eye: No discharge.        Left eye: No discharge.     Conjunctiva/sclera: Conjunctivae normal.     Right eye: Right conjunctiva is not injected. No chemosis.    Left eye: Left conjunctiva is not injected. No chemosis.    Pupils: Pupils are equal, round, and reactive to light.  Cardiovascular:     Rate and Rhythm: Normal rate and regular rhythm.     Heart sounds: Normal heart sounds.  Pulmonary:     Effort: Pulmonary effort is normal. No tachypnea, accessory muscle usage or respiratory distress.     Breath sounds: Normal breath sounds. No wheezing, rhonchi or rales.     Comments: Moving air well in all lung fields. No increased work of breathing noted.  Chest:     Chest wall:  No tenderness.  Abdominal:     Tenderness: There is no abdominal tenderness. There is no guarding or rebound.  Lymphadenopathy:     Head:     Right side of head: No submandibular, tonsillar or occipital adenopathy.     Left  side of head: No submandibular, tonsillar or occipital adenopathy.     Cervical: No cervical adenopathy.  Skin:    General: Skin is warm.     Capillary Refill: Capillary refill takes less than 2 seconds.     Coloration: Skin is not pale.     Findings: No abrasion, erythema, petechiae or rash. Rash is not papular, urticarial or vesicular.     Comments: There are some excoriations present.   Neurological:     Mental Status: She is alert.  Psychiatric:        Behavior: Behavior is cooperative.      Diagnostic studies:    Spirometry: results normal (FEV1: 2.36/99%, FVC: 2.91/99%, FEV1/FVC: 81%).    Spirometry consistent with normal pattern. The FVL looks a bit stunted, but this is likely secondary to technique.   Allergy Studies: deferred due to insurance stipulations that require a separate visit for testing          Marty Shaggy, MD [facility] 

## 2024-05-18 NOTE — Patient Instructions (Addendum)
 1. Moderate persistent asthma, uncomplicated (Primary) - Lung testing looked decent today. - We are not going to make any changes today. - If you are needing to use your albuterol  frequently, we might need to consider making changes. - Daily controller medication(s): Singulair  10mg  daily and Flovent  110mcg 20 puffs twice daily with spacer - Prior to physical activity: albuterol  2 puffs 10-15 minutes before physical activity. - Rescue medications: albuterol  4 puffs every 4-6 hours as needed and albuterol  nebulizer one vial every 4-6 hours as needed - Changes during respiratory infections or worsening symptoms: Increase Flovent  to 4 puffs twice daily for TWO WEEKS. - Asthma control goals:  * Full participation in all desired activities (may need albuterol  before activity) * Albuterol  use two time or less a week on average (not counting use with activity) * Cough interfering with sleep two time or less a month * Oral steroids no more than once a year * No hospitalizations  2. Chronic rhinitis - Because of insurance stipulations, we cannot do skin testing on the same day as your first visit. - We are all working to fight this, but for now we need to do two separate visits.  - We will know more after we do testing at the next visit.  - The skin testing visit can be squeezed in at your convenience.  - Then we can make a more full plan to address all of your symptoms. - Be sure to stop your antihistamines for 3 days before this appointment.   3. Chronic urticaria - Your history does not have any red flags such as fevers, joint pains, or permanent skin changes that would be concerning for a more serious cause of hives.  - We will get some labs to rule out serious causes of hives: alpha gal panel, complete blood count, tryptase level, chronic urticaria panel, CMP, ESR, and CRP. - Chronic hives are often times a self limited process and will burn themselves out over 6-12 months, although this is  not always the case.  - In the meantime, start suppressive dosing of antihistamines:   - Morning: Xyzal (levocetirizine) 10mg  (two tablets) + Pepcid (famotidine) 20mg   - Evening: Zyrtec  (cetirizine ) 20mg  (two tablets) + Pepcid (famotidine) 20mg  - You can change this dosing at home, decreasing the dose as needed or increasing the dosing as needed.  - If you are not tolerating the medications or are tired of taking them every day, we can start treatment with a monthly injectable medication called Xolair or Dupixent.   4. Hidradenitis suppurativa - We could send you to see a Dermatologist who specializes in this condition if you are interested.  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   5. Return in about 1 week (around 05/25/2024) for SKIN TESTING (1-68). You can have the follow up appointment with Dr. Iva or a Nurse Practicioner (our Nurse Practitioners are excellent and always have Physician oversight!).    Please inform us  of any Emergency Department visits, hospitalizations, or changes in symptoms. Call us  before going to the ED for breathing or allergy symptoms since we might be able to fit you in for a sick visit. Feel free to contact us  anytime with any questions, problems, or concerns.  It was a pleasure to see you again today!  Websites that have reliable patient information: 1. American Academy of Asthma, Allergy, and Immunology: www.aaaai.org 2. Food Allergy Research and Education (FARE): foodallergy.org 3. Mothers of Asthmatics: http://www.asthmacommunitynetwork.org 4. [employer]  of Allergy, Asthma, and Immunology: www.acaai.org      "Like" us  on Facebook and Instagram for our latest updates!      A healthy democracy works best when [employer] participate! Make sure you are registered to vote! If you have moved or changed any of your contact information, you will need to get this updated before voting! Scan the QR codes below to learn  more!

## 2024-05-19 ENCOUNTER — Ambulatory Visit

## 2024-05-22 ENCOUNTER — Other Ambulatory Visit: Payer: Self-pay | Admitting: Podiatry

## 2024-05-22 ENCOUNTER — Ambulatory Visit (INDEPENDENT_AMBULATORY_CARE_PROVIDER_SITE_OTHER): Admitting: Podiatry

## 2024-05-22 ENCOUNTER — Encounter: Payer: Self-pay | Admitting: Podiatry

## 2024-05-22 DIAGNOSIS — Z9889 Other specified postprocedural states: Secondary | ICD-10-CM

## 2024-05-22 DIAGNOSIS — S86011D Strain of right Achilles tendon, subsequent encounter: Secondary | ICD-10-CM | POA: Diagnosis not present

## 2024-05-22 NOTE — Progress Notes (Signed)
 Subjective:  Patient ID: Ruth [name], female    DOB: 02/02/1976,  MRN: [id number]  Chief Complaint  Patient presents with   Routine Post Op    POV #3 DOS 02/29/24 RT ACHILLES TENDON REPAIR W/ FLEXOR HALLUCIS LONGUS TRANSFER, POSSIBLE V-Y LENGTHENING It's alright, tender.  I don't know why my other foot is still hurting.:    DOS: 02/29/24 Procedure: Right achilles tendon repair with FHL transfer and V-Y lengthening   48 y.o. female returns for POV#4. Relates tender on both feet still hurting.  She stopped going to PT because she feels like she was doing her exercises at home.  She has continued in the boot just when at work.  Review of Systems: Negative except as noted in the HPI. Denies N/V/F/Ch.  Past Medical History:  Diagnosis Date   Abnormal uterine bleeding 2023   ADHD (attention deficit hyperactivity disorder)    Anemia 2023   Anxiety    Arthritis    chronic back pain    Asthma    rarely uses inhaler   Bipolar 1 disorder (HCC)    Follows w/ PCP Dr. Raguel Blush.   Depression    Fibromyalgia    GERD (gastroesophageal reflux disease)    Headache    Hidradenitis    multiple boils, axilla and groin (As of 08/03/22, patient has a boil in her left axilla.)   HLD (hyperlipidemia)    diet controlled   Hx of migraines    occ   Hypertension    no meds   Hypothyroidism    Intentional drug overdose (HCC) 03/28/2021   Obesity    Sleep apnea    Patient states that she had a sleep study in the past and was told she did not need a CPAP.   Thrombocytosis 01/26/2020   Type 2 diabetes mellitus (HCC)    Type 2, Follows w/ PCP Dr. Raguel Blush.   Wears glasses     Current Outpatient Medications:    albuterol  (PROVENTIL ) (2.5 MG/3ML) 0.083% nebulizer solution, Take 3 mLs (2.5 mg total) by nebulization every 6 (six) hours as needed for wheezing or shortness of breath., Disp: 75 mL, Rfl: 12   albuterol  (VENTOLIN  HFA) 108 (90 Base) MCG/ACT inhaler, Inhale 2 puffs  into the lungs every 4 (four) hours as needed for wheezing or shortness of breath., Disp: 18 g, Rfl: 5   aspirin  EC 81 MG tablet, Take 81 mg by mouth daily. Swallow whole., Disp: , Rfl:    cetirizine  (ZYRTEC ) 10 MG tablet, Take 2 tablets (20 mg total) by mouth every evening., Disp: 180 tablet, Rfl: 0   clindamycin  (CLEOCIN ) 300 MG capsule, Take 1 capsule (300 mg total) by mouth 3 (three) times daily., Disp: 30 capsule, Rfl: 2   Continuous Glucose Sensor (DEXCOM G7 SENSOR) MISC, Use to check blood sugar three time daily. Change sensors once every 10 days., Disp: 3 each, Rfl: 2   cromolyn (OPTICROM) 4 % ophthalmic solution, Place 1 drop into both eyes in the morning and at bedtime., Disp: , Rfl:    famotidine (PEPCID) 20 MG tablet, Take 1 tablet (20 mg total) by mouth 2 (two) times daily., Disp: 180 tablet, Rfl: 0   fluticasone  (FLONASE ) 50 MCG/ACT nasal spray, Place 1 spray into both nostrils daily. (Patient taking differently: Place 1 spray into both nostrils at bedtime.), Disp: 9.9 mL, Rfl: 2   fluticasone  (FLOVENT  HFA) 110 MCG/ACT inhaler, Inhale 2 puffs into the lungs 2 (two) times daily., Disp: 12 g,  Rfl: 5   gabapentin  (NEURONTIN ) 300 MG capsule, Take 1 capsule (300 mg total) by mouth at bedtime., Disp: 90 capsule, Rfl: 3   hydrOXYzine  (VISTARIL ) 25 MG capsule, Take 1 capsule (25 mg total) by mouth every 8 (eight) hours as needed for anxiety., Disp: 30 capsule, Rfl: 3   ibuprofen  (ADVIL ) 600 MG tablet, Take 1 tablet (600 mg total) by mouth every 8 (eight) hours as needed., Disp: 30 tablet, Rfl: 0   Iron , Ferrous Sulfate , 325 (65 Fe) MG TABS, Take 325 mg (1 tablet) by mouth daily., Disp: 30 tablet, Rfl: 3   levocetirizine (XYZAL) 5 MG tablet, Take 2 tablets (10 mg total) by mouth in the morning., Disp: 180 tablet, Rfl: 0   montelukast  (SINGULAIR ) 10 MG tablet, Take 1 tablet (10 mg total) by mouth at bedtime., Disp: 90 tablet, Rfl: 1   omeprazole  (PRILOSEC) 40 MG capsule, Take 1 capsule (40 mg  total) by mouth daily., Disp: 90 capsule, Rfl: 1   ondansetron  (ZOFRAN ) 4 MG tablet, Take 1 tablet (4 mg total) by mouth every 8 (eight) hours as needed for nausea or vomiting. (Patient not taking: Reported on 05/18/2024), Disp: 20 tablet, Rfl: 0   ondansetron  (ZOFRAN ) 4 MG tablet, Take 1 tablet (4 mg total) by mouth every 8 (eight) hours as needed for nausea or vomiting., Disp: 20 tablet, Rfl: 0   rosuvastatin  (CRESTOR ) 20 MG tablet, Take 1 tablet (20 mg total) by mouth daily. (Patient taking differently: Take 20 mg by mouth every evening.), Disp: 90 tablet, Rfl: 1   sertraline  (ZOLOFT ) 50 MG tablet, Take 1 tablet (50 mg total) by mouth daily., Disp: 90 tablet, Rfl: 0   tirzepatide  (MOUNJARO ) 12.5 MG/0.5ML Pen, Inject 12.5 mg into the skin once a week., Disp: 6 mL, Rfl: 0   XIIDRA 5 % SOLN, Place 1 drop into both eyes 2 (two) times daily., Disp: , Rfl:   Social History   Tobacco Use  Smoking Status Every Day   Current packs/day: 0.25   Types: Cigarettes  Smokeless Tobacco Never  Tobacco Comments   Per patient, she only smokes 2 to 3 cigarettes daily as of 08/03/22.    Allergies  Allergen Reactions   Metformin  And Related Diarrhea   Penicillins Other (See Comments)    Unknown; from childhood Has patient had a PCN reaction causing immediate rash, facial/tongue/throat swelling, SOB or lightheadedness with hypotension: Unknow Has patient had a PCN reaction causing severe rash involving mucus membranes or skin necrosis: Unknown Has patient had a PCN reaction that required hospitalization: Unknown Has patient had a PCN reaction occurring within the last 10 years: Unknown If all of the above answers are NO, then may proceed with Cephalosporin use.    Victoza [Liraglutide] Itching   Objective:   There were no vitals filed for this visit.   There is no height or weight on file to calculate BMI. Constitutional Well developed. Well nourished.  Vascular Foot warm and well  perfused. Capillary refill normal to all digits.   Neurologic Normal speech. Oriented to person, place, and time. Epicritic sensation to light touch grossly present bilaterally.  Dermatologic Skin healing well without signs of infection. Skin edges well coapted without signs of infection. Small scabbing area noted to distal incision.   Orthopedic: Tenderness to palpation noted about the surgical site.   Assessment:   1. Status post surgery   2. Rupture of right Achilles tendon, subsequent encounter     Plan:  Patient was evaluated and treated  and all questions answered.  S/p foot surgery right -Progressing as expected post-operatively. -WB Status: Weightbearing as tolerated in regular shoes - Continue working on stretching exercises.  And transitioning out of the boot -Medications: n/a - New boot dispensed as previous boot was destroyed by her dog.  Return in 3 months for recheck.   No follow-ups on file.

## 2024-05-23 ENCOUNTER — Other Ambulatory Visit: Payer: Self-pay

## 2024-05-26 ENCOUNTER — Ambulatory Visit

## 2024-05-30 ENCOUNTER — Ambulatory Visit: Payer: Self-pay | Admitting: Allergy & Immunology

## 2024-05-30 DIAGNOSIS — R7689 Other specified abnormal immunological findings in serum: Secondary | ICD-10-CM

## 2024-05-31 LAB — CMP14+EGFR
ALT: 8 IU/L (ref 0–32)
AST: 11 IU/L (ref 0–40)
Albumin: 4.4 g/dL (ref 3.9–4.9)
Alkaline Phosphatase: 74 IU/L (ref 41–116)
BUN/Creatinine Ratio: 15 (ref 9–23)
BUN: 11 mg/dL (ref 6–24)
Bilirubin Total: 0.3 mg/dL (ref 0.0–1.2)
CO2: 24 mmol/L (ref 20–29)
Calcium: 9.7 mg/dL (ref 8.7–10.2)
Chloride: 102 mmol/L (ref 96–106)
Creatinine, Ser: 0.72 mg/dL (ref 0.57–1.00)
Globulin, Total: 2.8 g/dL (ref 1.5–4.5)
Glucose: 106 mg/dL — ABNORMAL HIGH (ref 70–99)
Potassium: 3.7 mmol/L (ref 3.5–5.2)
Sodium: 143 mmol/L (ref 134–144)
Total Protein: 7.2 g/dL (ref 6.0–8.5)
eGFR: 102 mL/min/1.73 (ref >59)

## 2024-05-31 LAB — TRYPTASE: Tryptase: 3.5 ug/L (ref 2.2–13.2)

## 2024-05-31 LAB — CBC WITH DIFF/PLATELET
Basophils Absolute: 0 x10E3/uL (ref 0.0–0.2)
Basos: 0 %
EOS (ABSOLUTE): 0.1 x10E3/uL (ref 0.0–0.4)
Eos: 3 %
Hematocrit: 36.9 % (ref 34.0–46.6)
Hemoglobin: 12.1 g/dL (ref 11.1–15.9)
Immature Grans (Abs): 0 x10E3/uL (ref 0.0–0.1)
Immature Granulocytes: 0 %
Lymphocytes Absolute: 1.4 x10E3/uL (ref 0.7–3.1)
Lymphs: 43 %
MCH: 28.1 pg (ref 26.6–33.0)
MCHC: 32.8 g/dL (ref 31.5–35.7)
MCV: 86 fL (ref 79–97)
Monocytes Absolute: 0.2 x10E3/uL (ref 0.1–0.9)
Monocytes: 5 %
Neutrophils Absolute: 1.6 x10E3/uL (ref 1.4–7.0)
Neutrophils: 49 %
Platelets: 361 x10E3/uL (ref 150–450)
RBC: 4.31 x10E6/uL (ref 3.77–5.28)
RDW: 13.3 % (ref 11.7–15.4)
WBC: 3.3 x10E3/uL — ABNORMAL LOW (ref 3.4–10.8)

## 2024-05-31 LAB — ALPHA-GAL PANEL
Allergen Lamb IgE: <0.1 kU/L
Beef IgE: <0.1 kU/L
IgE (Immunoglobulin E), Serum: 100 [IU]/mL (ref 6–495)
O215-IgE Alpha-Gal: <0.1 kU/L
Pork IgE: <0.1 kU/L

## 2024-05-31 LAB — THYROID ANTIBODIES (THYROPEROXIDASE & THYROGLOBULIN)
Thyroglobulin Antibody: 1.2 [IU]/mL — ABNORMAL HIGH (ref 0.0–0.9)
Thyroperoxidase Ab SerPl-aCnc: 125 [IU]/mL — ABNORMAL HIGH (ref 0–34)

## 2024-05-31 LAB — SEDIMENTATION RATE: Sed Rate: 22 mm/h (ref 0–40)

## 2024-05-31 LAB — ANTINUCLEAR ANTIBODIES, IFA: ANA Titer 1: NEGATIVE

## 2024-05-31 LAB — C-REACTIVE PROTEIN: CRP: 4 mg/L (ref 0–10)

## 2024-05-31 LAB — CHRONIC URTICARIA PD-BAT: Pooled Donor- BAT CU: 7.29 % (ref 0.00–10.60)

## 2024-06-06 ENCOUNTER — Other Ambulatory Visit: Payer: Self-pay

## 2024-06-07 ENCOUNTER — Ambulatory Visit (HOSPITAL_COMMUNITY)
Admission: RE | Admit: 2024-06-07 | Discharge: 2024-06-07 | Disposition: A | Discharge status: Home / Self Care | Source: Ambulatory Visit

## 2024-06-07 ENCOUNTER — Encounter (HOSPITAL_COMMUNITY): Payer: Self-pay

## 2024-06-07 ENCOUNTER — Other Ambulatory Visit: Payer: Self-pay

## 2024-06-07 VITALS — BP 150/88 | HR 79 | Temp 97.7°F | Resp 18

## 2024-06-07 DIAGNOSIS — L732 Hidradenitis suppurativa: Secondary | ICD-10-CM

## 2024-06-07 MED ORDER — DOXYCYCLINE HYCLATE 100 MG PO CAPS
100.0000 mg | ORAL_CAPSULE | Freq: Two times a day (BID) | ORAL | 0 refills | Status: AC
  Filled 2024-06-07: qty 20, 10d supply, fill #0

## 2024-06-07 NOTE — ED Triage Notes (Signed)
 Pt reports she has boil under right breast and in her groin area. Been putting heat on areas to try to get to drain and think burned herself. Pt reports has HS.   Pt also c/o left lower back pain since yesterday. Took ibuprofen .

## 2024-06-07 NOTE — ED Provider Notes (Signed)
 [facility]  Note:  This document was prepared using Conservation officer, historic buildings and may include unintentional dictation errors.  MRN: [id number] DOB: 26-Jul-1975  Subjective:   Ruth [name] is a 48 y.o. female presenting for evaluation of a painful boil underneath her breast, in the bilateral groin and a burn to lower abdomen from a heating pad.  Patient reports that she has been treating painful boils with direct heat and believes that she may have burned her abdomen with the heating pad.  Patient denies any significant drainage from abscesses.  Has not used any over-the-counter ointment or pain reliever.  Patient has history of hidradenitis suppurativa.  No current facility-administered medications for this encounter.  Current Outpatient Medications:    doxycycline  (VIBRAMYCIN ) 100 MG capsule, Take 1 capsule (100 mg total) by mouth 2 (two) times daily., Disp: 20 capsule, Rfl: 0   albuterol  (PROVENTIL ) (2.5 MG/3ML) 0.083% nebulizer solution, Take 3 mLs (2.5 mg total) by nebulization every 6 (six) hours as needed for wheezing or shortness of breath., Disp: 75 mL, Rfl: 12   albuterol  (VENTOLIN  HFA) 108 (90 Base) MCG/ACT inhaler, Inhale 2 puffs into the lungs every 4 (four) hours as needed for wheezing or shortness of breath., Disp: 18 g, Rfl: 5   aspirin  EC 81 MG tablet, Take 81 mg by mouth daily. Swallow whole., Disp: , Rfl:    cetirizine  (ZYRTEC ) 10 MG tablet, Take 2 tablets (20 mg total) by mouth every evening., Disp: 180 tablet, Rfl: 0   clindamycin  (CLEOCIN ) 300 MG capsule, Take 1 capsule (300 mg total) by mouth 3 (three) times daily., Disp: 30 capsule, Rfl: 2   cromolyn (OPTICROM) 4 % ophthalmic solution, Place 1 drop into both eyes in the morning and at bedtime., Disp: , Rfl:    famotidine  (PEPCID ) 20 MG tablet, Take 1 tablet (20 mg total) by mouth 2 (two) times daily., Disp: 180 tablet, Rfl: 0   fluticasone  (FLONASE ) 50 MCG/ACT nasal spray, Place 1  spray into both nostrils daily. (Patient taking differently: Place 1 spray into both nostrils at bedtime.), Disp: 9.9 mL, Rfl: 2   fluticasone  (FLOVENT  HFA) 110 MCG/ACT inhaler, Inhale 2 puffs into the lungs 2 (two) times daily., Disp: 12 g, Rfl: 5   gabapentin  (NEURONTIN ) 300 MG capsule, Take 1 capsule (300 mg total) by mouth at bedtime., Disp: 90 capsule, Rfl: 3   hydrOXYzine  (VISTARIL ) 25 MG capsule, Take 1 capsule (25 mg total) by mouth every 8 (eight) hours as needed for anxiety., Disp: 30 capsule, Rfl: 3   ibuprofen  (ADVIL ) 600 MG tablet, Take 1 tablet (600 mg total) by mouth every 8 (eight) hours as needed., Disp: 30 tablet, Rfl: 0   Iron , Ferrous Sulfate , 325 (65 Fe) MG TABS, Take 325 mg (1 tablet) by mouth daily., Disp: 30 tablet, Rfl: 3   levocetirizine (XYZAL ) 5 MG tablet, Take 2 tablets (10 mg total) by mouth in the morning., Disp: 180 tablet, Rfl: 0   montelukast  (SINGULAIR ) 10 MG tablet, Take 1 tablet (10 mg total) by mouth at bedtime., Disp: 90 tablet, Rfl: 1   omeprazole  (PRILOSEC) 40 MG capsule, Take 1 capsule (40 mg total) by mouth daily., Disp: 90 capsule, Rfl: 1   ondansetron  (ZOFRAN ) 4 MG tablet, Take 1 tablet (4 mg total) by mouth every 8 (eight) hours as needed for nausea or vomiting. (Patient not taking: Reported on 05/18/2024), Disp: 20 tablet, Rfl: 0   ondansetron  (ZOFRAN ) 4 MG tablet, Take 1 tablet (4 mg total) by  mouth every 8 (eight) hours as needed for nausea or vomiting., Disp: 20 tablet, Rfl: 0   rosuvastatin  (CRESTOR ) 20 MG tablet, Take 1 tablet (20 mg total) by mouth daily. (Patient taking differently: Take 20 mg by mouth every evening.), Disp: 90 tablet, Rfl: 1   sertraline  (ZOLOFT ) 50 MG tablet, Take 1 tablet (50 mg total) by mouth daily., Disp: 90 tablet, Rfl: 0   tirzepatide  (MOUNJARO ) 12.5 MG/0.5ML Pen, Inject 12.5 mg into the skin once a week., Disp: 6 mL, Rfl: 0   XIIDRA 5 % SOLN, Place 1 drop into both eyes 2 (two) times daily., Disp: , Rfl:    Allergies   Allergen Reactions   Metformin  And Related Diarrhea   Penicillins Other (See Comments)    Unknown; from childhood Has patient had a PCN reaction causing immediate rash, facial/tongue/throat swelling, SOB or lightheadedness with hypotension: Unknow Has patient had a PCN reaction causing severe rash involving mucus membranes or skin necrosis: Unknown Has patient had a PCN reaction that required hospitalization: Unknown Has patient had a PCN reaction occurring within the last 10 years: Unknown If all of the above answers are NO, then may proceed with Cephalosporin use.    Victoza [Liraglutide] Itching    Past Medical History:  Diagnosis Date   Abnormal uterine bleeding 2023   ADHD (attention deficit hyperactivity disorder)    Anemia 2023   Anxiety    Arthritis    chronic back pain    Asthma    rarely uses inhaler   Bipolar 1 disorder (HCC)    Follows w/ PCP Dr. Raguel Blush.   Depression    Fibromyalgia    GERD (gastroesophageal reflux disease)    Headache    Hidradenitis    multiple boils, axilla and groin (As of 08/03/22, patient has a boil in her left axilla.)   HLD (hyperlipidemia)    diet controlled   Hx of migraines    occ   Hypertension    no meds   Hypothyroidism    Intentional drug overdose (HCC) 03/28/2021   Obesity    Sleep apnea    Patient states that she had a sleep study in the past and was told she did not need a CPAP.   Thrombocytosis 01/26/2020   Type 2 diabetes mellitus (HCC)    Type 2, Follows w/ PCP Dr. Raguel Blush.   Wears glasses      Past Surgical History:  Procedure Laterality Date   ACHILLES TENDON SURGERY Left 08/13/2023   Procedure: ACHILLES TENDON REPAIR;  Surgeon: Joya Stabs, DPM;  Location: [facility];  Service: Orthopedics/Podiatry;  Laterality: Left;   ACHILLES TENDON SURGERY Right 02/29/2024   Procedure: REPAIR, TENDON, ACHILLES;  Surgeon: Joya Stabs, DPM;  Location: [facility];  Service: Orthopedics/Podiatry;  Laterality: Right;    ANTERIOR CERVICAL DECOMP/DISCECTOMY FUSION N/A 01/16/2021   Procedure: Cervical five-six Anterior cervical decompression/discectomy/fusion;  Surgeon: Lanis Pupa, MD;  Location: [facility] OR;  Service: Neurosurgery;  Laterality: N/A;   CALCANEAL OSTEOTOMY Right 01/21/2024   Procedure: OSTEOTOMY, CALCANEUS;  Surgeon: Joya Stabs, DPM;  Location: [facility];  Service: Orthopedics/Podiatry;  Laterality: Right;  POSTERIOR POP BLOCK   CARPAL TUNNEL RELEASE Right    around 2009   CESAREAN SECTION     x2, 2003 & 2008   CYSTOSCOPY N/A 08/12/2022   Procedure: CYSTOSCOPY;  Surgeon: Jeralyn Crutch, MD;  Location: [facility];  Service: Gynecology;  Laterality: N/A;   DILITATION & CURRETTAGE/HYSTROSCOPY WITH NOVASURE ABLATION N/A 12/07/2017  Procedure: DILATATION & CURETTAGE/HYSTEROSCOPY WITH ATTEMPED NOVASURE ABLATION, FAILED/ENDOMETERIAL ABLATION WITH HTA SYSTEM;  Surgeon: Corene Coy, MD;  Location: [facility];  Service: Gynecology;  Laterality: N/A;   ELBOW SURGERY Left    surgery for nerve pain around 2009   ENDOMETRIAL BIOPSY  07/23/2022   negative for hyperplasia or malignancy   GANGLION CYST EXCISION Right    right arm    GASTROC RECESSION EXTREMITY Right 02/29/2024   Procedure: RECESSION, TENDON, GASTROCNEMIUS;  Surgeon: Joya Stabs, DPM;  Location: [facility];  Service: Orthopedics/Podiatry;  Laterality: Right;   OSTECTOMY Left 08/13/2023   Procedure: CALCANEAL OSTECTOMY;  Surgeon: Joya Stabs, DPM;  Location: [facility];  Service: Orthopedics/Podiatry;  Laterality: Left;  POPLITEAL/SAPHENOUS BLOCK   RECTOVAGINAL FISTULA CLOSURE     in 8001,8000, 2006   ROBOTIC ASSISTED TOTAL HYSTERECTOMY Bilateral 08/12/2022   Procedure: XI ROBOTIC ASSISTED TOTAL HYSTERECTOMY WITH SALPINGECTOMY;  Surgeon: Jeralyn Crutch, MD;  Location: [facility];  Service: Gynecology;  Laterality: Bilateral;   TENDON REPAIR Right 01/21/2024   Procedure: TENDON REPAIR;   Surgeon: Joya Stabs, DPM;  Location: [facility];  Service: Orthopedics/Podiatry;  Laterality: Right;   TENDON TRANSFER Right 02/29/2024   Procedure: TRANSFER, TENDON;  Surgeon: Joya Stabs, DPM;  Location: [facility];  Service: Orthopedics/Podiatry;  Laterality: Right;  with muscle redirection   TONSILLECTOMY     childhood   TUBAL LIGATION  2008   XI ROBOT ASSISTED RECTOPEXY N/A 08/12/2022   Procedure: XI robot repair serosal repair of rectum;  Surgeon: Sheldon Standing, MD;  Location: [facility];  Service: General;  Laterality: N/A;    Family History  Problem Relation Age of Onset   Thyroid  disease Mother    Diabetes Daughter    Diabetes Son    Breast cancer Neg Hx     Social History   Tobacco Use   Smoking status: Every Day    Current packs/day: 0.25    Types: Cigarettes   Smokeless tobacco: Never   Tobacco comments:    Per patient, she only smokes 2 to 3 cigarettes daily as of 08/03/22.  Vaping Use   Vaping status: Some Days   Substances: CBD  Substance Use Topics   Alcohol use: Not Currently   Drug use: Yes    Types: Marijuana    Comment: Occasional use only    ROS Refer to HPI for ROS details.  Objective:    Vitals: BP (!) 150/88 (BP Location: Left Arm)   Pulse 79   Temp 97.7 F (36.5 C) (Oral)   Resp 18   LMP 07/05/2022   SpO2 97%   Physical Exam Vitals and nursing note reviewed.  Constitutional:      General: She is not in acute distress.    Appearance: Normal appearance. She is well-developed. She is not ill-appearing or toxic-appearing.  HENT:     Head: Normocephalic and atraumatic.  Cardiovascular:     Rate and Rhythm: Normal rate.  Pulmonary:     Effort: Pulmonary effort is normal. No respiratory distress.     Breath sounds: No stridor. No wheezing.  Skin:    General: Skin is warm and dry.     Findings: Burn, erythema and rash present. Rash is papular and pustular.      Neurological:     General: No focal deficit present.      Mental Status: She is alert and oriented to person, place, and time.  Psychiatric:        Mood and  Affect: Mood normal.        Behavior: Behavior normal.     Procedures  No results found for this or any previous visit (from the past 24 hours).  Assessment and Plan :     Discharge Instructions       1. Hidradenitis suppurativa (Primary) - doxycycline  (VIBRAMYCIN ) 100 MG capsule; Take 1 capsule (100 mg total) by mouth 2 (two) times daily.  Dispense: 20 capsule; Refill: 0 - Ambulatory referral to Dermatology for evaluation of persistent hidradenitis suppurativa.  -Continue to monitor symptoms for any change in severity if there is any escalation of current symptoms or development of new symptoms follow-up in ER for further evaluation and management.     Eben Choinski B [place]   Addalee Kavanagh, [place], [place] 06/07/24 1329

## 2024-06-07 NOTE — Discharge Instructions (Addendum)
  1. Hidradenitis suppurativa (Primary) - doxycycline  (VIBRAMYCIN ) 100 MG capsule; Take 1 capsule (100 mg total) by mouth 2 (two) times daily.  Dispense: 20 capsule; Refill: 0 - Ambulatory referral to Dermatology for evaluation of persistent hidradenitis suppurativa.  -Continue to monitor symptoms for any change in severity if there is any escalation of current symptoms or development of new symptoms follow-up in ER for further evaluation and management.

## 2024-06-08 ENCOUNTER — Ambulatory Visit: Admitting: Allergy & Immunology

## 2024-06-08 ENCOUNTER — Encounter: Payer: Self-pay | Admitting: Allergy & Immunology

## 2024-06-08 DIAGNOSIS — J302 Other seasonal allergic rhinitis: Secondary | ICD-10-CM

## 2024-06-08 DIAGNOSIS — L508 Other urticaria: Secondary | ICD-10-CM | POA: Diagnosis not present

## 2024-06-08 DIAGNOSIS — J3089 Other allergic rhinitis: Secondary | ICD-10-CM | POA: Diagnosis not present

## 2024-06-08 NOTE — Progress Notes (Signed)
 FOLLOW UP  Date of Service/Encounter:  06/08/24   Assessment:   Perennial and seasonal allergic rhinitis (ragweed, weeds, indoor molds, outdoor molds, dust mites, and cat)   Moderate persistent asthma, uncomplicated   Chronic urticaria - with anti-thyroid  antibodies   Hidradenitis suppurativa  Plan/Recommendations:   1. Moderate persistent asthma, uncomplicated - Lung testing looked decent today. - We are not going to make any changes today. - If you are needing to use your albuterol  frequently, we might need to consider making changes. - Daily controller medication(s): Singulair  10mg  daily and Flovent  110mcg 20 puffs twice daily with spacer - Prior to physical activity: albuterol  2 puffs 10-15 minutes before physical activity. - Rescue medications: albuterol  4 puffs every 4-6 hours as needed and albuterol  nebulizer one vial every 4-6 hours as needed - Changes during respiratory infections or worsening symptoms: Increase Flovent  to 4 puffs twice daily for TWO WEEKS. - Asthma control goals:  * Full participation in all desired activities (may need albuterol  before activity) * Albuterol  use two time or less a week on average (not counting use with activity) * Cough interfering with sleep two time or less a month * Oral steroids no more than once a year * No hospitalizations  2. Chronic rhinitis - Testing today showed: ragweed, weeds, indoor molds, outdoor molds, dust mites, and cat - Copy of test results provided.  - Avoidance measures provided. - Continue with: current antihistamine regimen - You can use an extra dose of the antihistamine, if needed, for breakthrough symptoms.  - Consider nasal saline rinses 1-2 times daily to remove allergens from the nasal cavities as well as help with mucous clearance (this is especially helpful to do before the nasal sprays are given) - Consider allergy shots as a means of long-term control. - Allergy shots re-train and reset the  immune system to ignore environmental allergens and decrease the resulting immune response to those allergens (sneezing, itchy watery eyes, runny nose, nasal congestion, etc).    - Allergy shots improve symptoms in 75-85% of patients - Call us  when you make a decision about starting allergy shots.  3. Chronic urticaria - Testing was NEGATIVE to the most common food allergies. - This rules out more than 95% of all food allergies.  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 - We are getting thyroid  function to look into this more. - We will contact you in 1-2 weeks with the results of the testing,.  - Chronic hives are often times a self limited process and will burn themselves out over 6-12 months, although this is not always the case.  - In the meantime, continue with suppressive dosing of antihistamines:   - Morning: Xyzal  (levocetirizine) 10mg  (two tablets) + Pepcid  (famotidine ) 20mg   - Evening: Zyrtec  (cetirizine ) 20mg  (two tablets) + Pepcid  (famotidine ) 20mg  - You can change this dosing at home, decreasing the dose as needed or increasing the dosing as needed.  - If you are not tolerating the medications or are tired of taking them every day, we can start treatment with a monthly injectable medication called Xolair or Dupixent.   4. Hidradenitis suppurativa - We could send you to see a Dermatologist who specializes in this condition if you are interested.   5. Return in about 2 months (around 08/09/2024). You can have the follow up appointment with Dr. Iva or a Nurse Practicioner (our Nurse Practitioners are excellent and always have Physician oversight!).    Subjective:   Ruth [name] is  a 48 y.o. female presenting today for follow up of No chief complaint on file.   [name] has a history of the following: Patient Active Problem List   Diagnosis Date Noted   Achilles tendon rupture, right, subsequent encounter 02/29/2024   Tendonitis,  Achilles, right 01/21/2024   Stenosis of cervical spine with myelopathy (HCC) 01/16/2021   BMI 45.0-49.9, adult (HCC) 09/04/2020   Right arm pain 02/14/2020   Hyperlipidemia associated with type 2 diabetes mellitus (HCC) 09/19/2019   Abnormal uterine bleeding (AUB) 12/07/2017   Abnormal mammogram of both breasts 11/02/2016   Hypothyroidism 06/21/2009   Type 2 diabetes mellitus with peripheral neuropathy (HCC) 06/21/2009   BIPOLAR AFFECTIVE DISORDER 06/21/2009   PERSISTENT DISORDER INITIATING/MAINTAINING SLEEP 06/21/2009   INADEQUATE SLEEP HYGIENE 06/21/2009   OBSTRUCTIVE SLEEP APNEA 06/21/2009   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  Discussed the use of AI scribe software for clinical note transcription with the patient and/or guardian, who gave verbal consent to proceed.  Ruth [name] is a 48 y.o. female presenting for skin testing. She was last seen on November 13th. We could not do testing because her insurance company does not cover testing on the same day as a New Patient visit. She has been off of all antihistamines 3 days in anticipation of the testing.   She was last seen in November 2025.  At that time, lung testing looked decent.  We continue with Singulair  10 mg daily and Flovent  110 mcg 2 puffs twice daily with spacer.  For her rhinitis, we decided to do environmental allergy testing.  For her urticaria, we obtained some labs to look for serious causes of hives and swelling.  We started her on suppressive doses of antihistamine including Xyzal  and Pepcid  in the morning and Zyrtec  and Pepcid  at night.  Since her last visit, she has done well.  Otherwise, there have been no changes to her past medical history, surgical history, family history, or social history.    Review of  systems otherwise negative other than that mentioned in the HPI.    Objective:   Last menstrual period 07/05/2022. There is no height or weight on file to calculate BMI.    Physical exam deferred since this was a skin testing appointment only.   Diagnostic studies:   Allergy Studies:     Airborne Adult Perc - 06/08/24 1036     Time Antigen Placed 1036    Allergen Manufacturer Jestine    Location Back    Number of Test 55    1. Control-Buffer 50% Glycerol Negative    2. Control-Histamine 3+    3. Bahia Negative    4. Bermuda Negative    5. Johnson Negative    6. Kentucky  Blue Negative    7. Meadow Fescue Negative    8. Perennial Rye Negative    9. Timothy Negative    10. Ragweed Mix Negative    11. Cocklebur Negative    12. Plantain,  English Negative    13. Baccharis Negative    14. Dog Fennel Negative    15. Russian Thistle Negative    16. Lamb's Quarters Negative    17. Sheep Sorrell Negative    18. Rough Pigweed Negative    19. Marsh Elder, Rough Negative    20. Mugwort, Common Negative  21. Box, Elder Negative    22. Cedar, red Negative    23. Sweet Gum Negative    24. Pecan Pollen Negative    25. Pine Mix Negative    26. Walnut, Black Pollen Negative    27. Red Mulberry Negative    28. Ash Mix Negative    29. Birch Mix Negative    30. Beech American Negative    31. Cottonwood, Eastern Negative    32. Hickory, White Negative    33. Maple Mix Negative    34. Oak, Eastern Mix Negative    35. Sycamore Eastern Negative    36. Alternaria Alternata Negative    37. Cladosporium Herbarum Negative    38. Aspergillus Mix Negative    39. Penicillium Mix Negative    40. Bipolaris Sorokiniana (Helminthosporium) Negative    41. Drechslera Spicifera (Curvularia) Negative    42. Mucor Plumbeus Negative    43. Fusarium Moniliforme Negative    44. Aureobasidium Pullulans (pullulara) Negative    45. Rhizopus Oryzae Negative    46. Botrytis Cinera Negative    47.  Epicoccum Nigrum Negative    48. Phoma Betae Negative    49. Dust Mite Mix Negative    50. Cat Hair 10,000 BAU/ml Negative    51.  Dog Epithelia Negative    52. Mixed Feathers Negative    53. Horse Epithelia Negative    54. Cockroach, German Negative    55. Tobacco Leaf Negative          13 Food Perc - 06/08/24 1037       Test Information   Time Antigen Placed 1037    Allergen Manufacturer Greer    Location Back    Number of allergen test 13          Intradermal - 06/08/24 1110     Time Antigen Placed 1115    Allergen Manufacturer Jestine    Location Arm    Number of Test 16    Control Negative    Bahia Negative    Bermuda Negative    Johnson Negative    7 Grass Negative    Ragweed Mix 2+    Weed Mix 2+    Tree Mix Negative    Mold 1 2+    Mold 2 3+    Mold 3 2+    Mold 4 2+    Mite Mix 1+    Cat 1+    Dog Negative    Cockroach Negative          Food Adult Perc - 06/08/24 1100     Time Antigen Placed 1036    Allergen Manufacturer Greer    Location Back    Number of allergen test 20    1. Peanut Negative    2. Soybean Negative    3. Wheat Negative    4. Sesame Negative    5. Milk, Cow Negative    6. Casein Negative    7. Egg White, Chicken Negative    8. Shellfish Mix Negative    9. Fish Mix Negative    10. Cashew Negative    11. Walnut Food Negative    12. Almond Negative    13. Hazelnut Negative    14. Pecan Food Negative    15. Pistachio Negative    16. Brazil Nut Negative    17. Coconut Negative    18. Trout Negative    19. Tuna Negative    20. Salmon Negative  Allergy testing results were read and interpreted by myself, documented by clinical staff.      Marty Shaggy, MD  [facility] 

## 2024-06-08 NOTE — Patient Instructions (Addendum)
 1. Moderate persistent asthma, uncomplicated - Lung testing looked decent today. - We are not going to make any changes today. - If you are needing to use your albuterol  frequently, we might need to consider making changes. - Daily controller medication(s): Singulair  10mg  daily and Flovent  110mcg 20 puffs twice daily with spacer - Prior to physical activity: albuterol  2 puffs 10-15 minutes before physical activity. - Rescue medications: albuterol  4 puffs every 4-6 hours as needed and albuterol  nebulizer one vial every 4-6 hours as needed - Changes during respiratory infections or worsening symptoms: Increase Flovent  to 4 puffs twice daily for TWO WEEKS. - Asthma control goals:  * Full participation in all desired activities (may need albuterol  before activity) * Albuterol  use two time or less a week on average (not counting use with activity) * Cough interfering with sleep two time or less a month * Oral steroids no more than once a year * No hospitalizations  2. Chronic rhinitis - Testing today showed: ragweed, weeds, indoor molds, outdoor molds, dust mites, and cat - Copy of test results provided.  - Avoidance measures provided. - Continue with: current antihistamine regimen - You can use an extra dose of the antihistamine, if needed, for breakthrough symptoms.  - Consider nasal saline rinses 1-2 times daily to remove allergens from the nasal cavities as well as help with mucous clearance (this is especially helpful to do before the nasal sprays are given) - Consider allergy shots as a means of long-term control. - Allergy shots re-train and reset the immune system to ignore environmental allergens and decrease the resulting immune response to those allergens (sneezing, itchy watery eyes, runny nose, nasal congestion, etc).    - Allergy shots improve symptoms in 75-85% of patients - Call us  when you make a decision about starting allergy shots.  3. Chronic urticaria - Testing was  NEGATIVE to the most common food allergies. - This rules out more than 95% of all food allergies.  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 - We are getting thyroid  function to look into this more. - We will contact you in 1-2 weeks with the results of the testing,.  - Chronic hives are often times a self limited process and will burn themselves out over 6-12 months, although this is not always the case.  - In the meantime, continue with suppressive dosing of antihistamines:   - Morning: Xyzal  (levocetirizine) 10mg  (two tablets) + Pepcid  (famotidine ) 20mg   - Evening: Zyrtec  (cetirizine ) 20mg  (two tablets) + Pepcid  (famotidine ) 20mg  - You can change this dosing at home, decreasing the dose as needed or increasing the dosing as needed.  - If you are not tolerating the medications or are tired of taking them every day, we can start treatment with a monthly injectable medication called Xolair or Dupixent.   4. Hidradenitis suppurativa - We could send you to see a Dermatologist who specializes in this condition if you are interested.   5. Return in about 2 months (around 08/09/2024). You can have the follow up appointment with Dr. Iva or a Nurse Practicioner (our Nurse Practitioners are excellent and always have Physician oversight!).    Please inform us  of any Emergency Department visits, hospitalizations, or changes in symptoms. Call us  before going to the ED for breathing or allergy symptoms since we might be able to fit you in for a sick visit. Feel free to contact us  anytime with any questions, problems, or concerns.  It was a pleasure to see you  again today!  Websites that have reliable patient information: 1. American Academy of Asthma, Allergy, and Immunology: www.aaaai.org 2. Food Allergy Research and Education (FARE): foodallergy.org 3. Mothers of Asthmatics: http://www.asthmacommunitynetwork.org 4. American College of Allergy, Asthma, and Immunology:  www.acaai.org      "Like" us  on Facebook and Instagram for our latest updates!      A healthy democracy works best when [employer] participate! Make sure you are registered to vote! If you have moved or changed any of your contact information, you will need to get this updated before voting! Scan the QR codes below to learn more!         Airborne Adult Perc - 06/08/24 1036     Time Antigen Placed 1036    Allergen Manufacturer Jestine    Location Back    Number of Test 55    1. Control-Buffer 50% Glycerol Negative    2. Control-Histamine 3+    3. Bahia Negative    4. Bermuda Negative    5. Johnson Negative    6. [place]  Blue Negative    7. Meadow Fescue Negative    8. Perennial Rye Negative    9. Timothy Negative    10. Ragweed Mix Negative    11. Cocklebur Negative    12. Plantain,  English Negative    13. Baccharis Negative    14. Dog Fennel Negative    15. Russian Thistle Negative    16. Lamb's Quarters Negative    17. Sheep Sorrell Negative    18. Rough Pigweed Negative    19. Marsh Elder, Rough Negative    20. Mugwort, Common Negative    21. Box, Elder Negative    22. Cedar, red Negative    23. Sweet Gum Negative    24. Pecan Pollen Negative    25. Pine Mix Negative    26. Walnut, Black Pollen Negative    27. Red Mulberry Negative    28. Ash Mix Negative    29. Birch Mix Negative    30. Beech American Negative    31. Cottonwood, Eastern Negative    32. Hickory, White Negative    33. Maple Mix Negative    34. Oak, Eastern Mix Negative    35. Sycamore Eastern Negative    36. Alternaria Alternata Negative    37. Cladosporium Herbarum Negative    38. Aspergillus Mix Negative    39. Penicillium Mix Negative    40. Bipolaris Sorokiniana (Helminthosporium) Negative    41. Drechslera Spicifera (Curvularia) Negative    42. Mucor Plumbeus Negative    43. Fusarium Moniliforme Negative    44. Aureobasidium Pullulans (pullulara) Negative    45. Rhizopus Oryzae  Negative    46. Botrytis Cinera Negative    47. Epicoccum Nigrum Negative    48. Phoma Betae Negative    49. Dust Mite Mix Negative    50. Cat Hair 10,000 BAU/ml Negative    51.  Dog Epithelia Negative    52. Mixed Feathers Negative    53. Horse Epithelia Negative    54. Cockroach, German Negative    55. Tobacco Leaf Negative           Intradermal - 06/08/24 1110     Time Antigen Placed 1115    Allergen Manufacturer Jestine    Location Arm    Number of Test 16    Control Negative    Bahia Negative    Bermuda Negative    Johnson Negative    7  Grass Negative    Ragweed Mix 2+    Weed Mix 2+    Tree Mix Negative    Mold 1 2+    Mold 2 3+    Mold 3 2+    Mold 4 2+    Mite Mix 1+    Cat 1+    Dog Negative    Cockroach Negative          Food Adult Perc - 06/08/24 1100     Time Antigen Placed 1036    Allergen Manufacturer Greer    Location Back    Number of allergen test 20    1. Peanut Negative    2. Soybean Negative    3. Wheat Negative    4. Sesame Negative    5. Milk, Cow Negative    6. Casein Negative    7. Egg White, Chicken Negative    8. Shellfish Mix Negative    9. Fish Mix Negative    10. Cashew Negative    11. Walnut Food Negative    12. Almond Negative    13. Hazelnut Negative    14. Pecan Food Negative    15. Pistachio Negative    16. Brazil Nut Negative    17. Coconut Negative    18. Trout Negative    19. Tuna Negative    20. Salmon Negative          Reducing Pollen Exposure  The American Academy of Allergy, Asthma and Immunology suggests the following steps to reduce your exposure to pollen during allergy seasons.    Do not hang sheets or clothing out to dry; pollen may collect on these items. Do not mow lawns or spend time around freshly cut grass; mowing stirs up pollen. Keep windows closed at night.  Keep car windows closed while driving. Minimize morning activities outdoors, a time when pollen counts are usually at their  highest. Stay indoors as much as possible when pollen counts or humidity is high and on windy days when pollen tends to remain in the air longer. Use air conditioning when possible.  Many air conditioners have filters that trap the pollen spores. Use a HEPA room air filter to remove pollen form the indoor air you breathe.  Control of Mold Allergen   Mold and fungi can grow on a variety of surfaces provided certain temperature and moisture conditions exist.  Outdoor molds grow on plants, decaying vegetation and soil.  The major outdoor mold, Alternaria and Cladosporium, are found in very high numbers during hot and dry conditions.  Generally, a late Summer - Fall peak is seen for common outdoor fungal spores.  Rain will temporarily lower outdoor mold spore count, but counts rise rapidly when the rainy period ends.  The most important indoor molds are Aspergillus and Penicillium.  Dark, humid and poorly ventilated basements are ideal sites for mold growth.  The next most common sites of mold growth are the bathroom and the kitchen.  Outdoor (Seasonal) Mold Control  Positive outdoor molds via skin testing: Alternaria, Cladosporium, Bipolaris (Helminthsporium), Drechslera (Curvalaria), and Mucor  Use air conditioning and keep windows closed Avoid exposure to decaying vegetation. Avoid leaf raking. Avoid grain handling. Consider wearing a face mask if working in moldy areas.    Indoor (Perennial) Mold Control   Positive indoor molds via skin testing: Aspergillus, Penicillium, Fusarium, Aureobasidium (Pullulara), and Rhizopus  Maintain humidity below 50%. Clean washable surfaces with 5% bleach solution. Remove sources e.g. contaminated carpets.  Control of Dog or Cat Allergen  Avoidance is the best way to manage a dog or cat allergy. If you have a dog or cat and are allergic to dog or cats, consider removing the dog or cat from the home. If you have a dog or cat but don't want to find it  a new home, or if your family wants a pet even though someone in the household is allergic, here are some strategies that may help keep symptoms at bay:  Keep the pet out of your bedroom and restrict it to only a few rooms. Be advised that keeping the dog or cat in only one room will not limit the allergens to that room. Don't pet, hug or kiss the dog or cat; if you do, wash your hands with soap and water . High-efficiency particulate air (HEPA) cleaners run continuously in a bedroom or living room can reduce allergen levels over time. Regular use of a high-efficiency vacuum cleaner or a central vacuum can reduce allergen levels. Giving your dog or cat a bath at least once a week can reduce airborne allergen.  Control of Dust Mite Allergen    Dust mites play a major role in allergic asthma and rhinitis.  They occur in environments with high humidity wherever human skin is found.  Dust mites absorb humidity from the atmosphere (ie, they do not drink) and feed on organic matter (including shed human and animal skin).  Dust mites are a microscopic type of insect that you cannot see with the naked eye.  High levels of dust mites have been detected from mattresses, pillows, carpets, upholstered furniture, bed covers, clothes, soft toys and any woven material.  The principal allergen of the dust mite is found in its feces.  A gram of dust may contain 1,000 mites and 250,000 fecal particles.  Mite antigen is easily measured in the air during house cleaning activities.  Dust mites do not bite and do not cause harm to humans, other than by triggering allergies/asthma.    Ways to decrease your exposure to dust mites in your home:  Encase mattresses, box springs and pillows with a mite-impermeable barrier or cover   Wash sheets, blankets and drapes weekly in hot water  (130 F) with detergent and dry them in a dryer on the hot setting.  Have the room cleaned frequently with a vacuum cleaner and a damp dust-mop.   For carpeting or rugs, vacuuming with a vacuum cleaner equipped with a high-efficiency particulate air (HEPA) filter.  The dust mite allergic individual should not be in a room which is being cleaned and should wait 1 hour after cleaning before going into the room. Do not sleep on upholstered furniture (eg, couches).   If possible removing carpeting, upholstered furniture and drapery from the home is ideal.  Horizontal blinds should be eliminated in the rooms where the person spends the most time (bedroom, study, television room).  Washable vinyl, roller-type shades are optimal. Remove all non-washable stuffed toys from the bedroom.  Wash stuffed toys weekly like sheets and blankets above.   Reduce indoor humidity to less than 50%.  Inexpensive humidity monitors can be purchased at most hardware stores.  Do not use a humidifier as can make the problem worse and are not recommended.  Allergy Shots  Allergies are the result of a chain reaction that starts in the immune system. Your immune system controls how your body defends itself. For instance, if you have an allergy to pollen, your immune  system identifies pollen as an invader or allergen. Your immune system overreacts by producing antibodies called Immunoglobulin E (IgE). These antibodies travel to cells that release chemicals, causing an allergic reaction.  The concept behind allergy immunotherapy, whether it is received in the form of shots or tablets, is that the immune system can be desensitized to specific allergens that trigger allergy symptoms. Although it requires time and patience, the payback can be long-term relief. Allergy injections contain a dilute solution of those substances that you are allergic to based upon your skin testing and allergy history.   How Do Allergy Shots Work?  Allergy shots work much like a vaccine. Your body responds to injected amounts of a particular allergen given in increasing doses, eventually developing a  resistance and tolerance to it. Allergy shots can lead to decreased, minimal or no allergy symptoms.  There generally are two phases: build-up and maintenance. Build-up often ranges from three to six months and involves receiving injections with increasing amounts of the allergens. The shots are typically given once or twice a week, though more rapid build-up schedules are sometimes used.  The maintenance phase begins when the most effective dose is reached. This dose is different for each person, depending on how allergic you are and your response to the build-up injections. Once the maintenance dose is reached, there are longer periods between injections, typically two to four weeks.  Occasionally doctors give cortisone-type shots that can temporarily reduce allergy symptoms. These types of shots are different and should not be confused with allergy immunotherapy shots.  Who Can Be Treated with Allergy Shots?  Allergy shots may be a good treatment approach for people with allergic rhinitis (hay fever), allergic asthma, conjunctivitis (eye allergy) or stinging insect allergy.   Before deciding to begin allergy shots, you should consider:   The length of allergy season and the severity of your symptoms  Whether medications and/or changes to your environment can control your symptoms  Your desire to avoid long-term medication use  Time: allergy immunotherapy requires a major time commitment  Cost: may vary depending on your insurance coverage  Allergy shots for children age 69 and older are effective and often well tolerated. They might prevent the onset of new allergen sensitivities or the progression to asthma.  Allergy shots are not started on patients who are pregnant but can be continued on patients who become pregnant while receiving them. In some patients with other medical conditions or who take certain common medications, allergy shots may be of risk. It is important to mention other  medications you talk to your allergist.   What are the two types of build-ups offered:   RUSH or Rapid Desensitization -- one day of injections lasting from 8:30-4:30pm, injections every 1 hour.  Approximately half of the build-up process is completed in that one day.  The following week, normal build-up is resumed, and this entails ~16 visits either weekly or twice weekly, until reaching your "maintenance dose" which is continued weekly until eventually getting spaced out to every month for a duration of 3 to 5 years. The regular build-up appointments are nurse visits where the injections are administered, followed by required monitoring for 30 minutes.    Traditional build-up -- weekly visits for 6 -12 months until reaching "maintenance dose", then continue weekly until eventually spacing out to every 4 weeks as above. At these appointments, the injections are administered, followed by required monitoring for 30 minutes.     Either way is acceptable,  and both are equally effective. With the rush protocol, the advantage is that less time is spent here for injections overall AND you would also reach maintenance dosing faster (which is when the clinical benefit starts to become more apparent). Not everyone is a candidate for rapid desensitization.   IF we proceed with the RUSH protocol, there are premedications which must be taken the day before and the day after the rush only (this includes antihistamines, steroids, and Singulair ).  After the rush day, no prednisone  or Singulair  is required, and we just recommend antihistamines taken on your injection day.  What Is An Estimate of the Costs?  If you are interested in starting allergy injections, please check with your insurance company about your coverage for both allergy vial sets and allergy injections.  Please do so prior to making the appointment to start injections.  The following are CPT codes to give to your insurance company. These are the  amounts we BILL to the insurance company, but the amount YOU WILL PAY and WE RECEIVE IS SUBSTANTIALLY LESS and depends on the contracts we have with different insurance companies.   Amount Billed to Insurance Two allergy vial set  CPT 95165   $ 2400     Two injections   CPT 95117   $ 40  Regarding the allergy injections, your co-pay may or may not apply with each injection, so please confirm this with your insurance company. When you start allergy injections, 1 or 2 sets of vials are made based on your allergies.  Not all patients can be on one set of vials. A set of vials lasts 6 months to a year depending on how quickly you can proceed with your build-up of your allergy injections. Vials are personalized for each patient depending on their specific allergens.  How often are allergy injection given during the build-up period?   Injections are given at least weekly during the build-up period until your maintenance dose is achieved. Per the doctor's discretion, you may have the option of getting allergy injections two times per week during the build-up period. However, there must be at least 48 hours between injections. The build-up period is usually completed within 6-12 months depending on your ability to schedule injections and for adjustments for reactions. When maintenance dose is reached, your injection schedule is gradually changed to every two weeks and later to every three weeks. Injections will then continue every 4 weeks. Usually, injections are continued for a total of 3-5 years.   When Will I Feel Better?  Some may experience decreased allergy symptoms during the build-up phase. For others, it may take as long as 12 months on the maintenance dose. If there is no improvement after a year of maintenance, your allergist will discuss other treatment options with you.  If you aren't responding to allergy shots, it may be because there is not enough dose of the allergen in your vaccine or there  are missing allergens that were not identified during your allergy testing. Other reasons could be that there are high levels of the allergen in your environment or major exposure to non-allergic triggers like tobacco smoke.  What Is the Length of Treatment?  Once the maintenance dose is reached, allergy shots are generally continued for three to five years. The decision to stop should be discussed with your allergist at that time. Some people may experience a permanent reduction of allergy symptoms. Others may relapse and a longer course of allergy shots can be  considered.  What Are the Possible Reactions?  The two types of adverse reactions that can occur with allergy shots are local and systemic. Common local reactions include very mild redness and swelling at the injection site, which can happen immediately or several hours after. Report a delayed reaction from your last injection. These include arm swelling or runny nose, watery eyes or cough that occurs within 12-24 hours after injection. A systemic reaction, which is less common, affects the entire body or a particular body system. They are usually mild and typically respond quickly to medications. Signs include increased allergy symptoms such as sneezing, a stuffy nose or hives.   Rarely, a serious systemic reaction called anaphylaxis can develop. Symptoms include swelling in the throat, wheezing, a feeling of tightness in the chest, nausea or dizziness. Most serious systemic reactions develop within 30 minutes of allergy shots. This is why it is strongly recommended you wait in your doctor's office for 30 minutes after your injections. Your allergist is trained to watch for reactions, and his or her staff is trained and equipped with the proper medications to identify and treat them.   Report to the nurse immediately if you experience any of the following symptoms: swelling, itching or redness of the skin, hives, watery eyes/nose, breathing  difficulty, excessive sneezing, coughing, stomach pain, diarrhea, or light headedness. These symptoms may occur within 15-20 minutes after injection and may require medication.   Who Should Administer Allergy Shots?  The preferred location for receiving shots is your prescribing allergist's office. Injections can sometimes be given at another facility where the physician and staff are trained to recognize and treat reactions, and have received instructions by your prescribing allergist.  What if I am late for an injection?   Injection dose will be adjusted depending upon how many days or weeks you are late for your injection.   What if I am sick?   Please report any illness to the nurse before receiving injections. She may adjust your dose or postpone injections depending on your symptoms. If you have fever, flu, sinus infection or chest congestion it is best to postpone allergy injections until you are better. Never get an allergy injection if your asthma is causing you problems. If your symptoms persist, seek out medical care to get your health problem under control.  What If I am or Become Pregnant:  Women that become pregnant should schedule an appointment with [facility] before receiving any further allergy injections.

## 2024-06-09 LAB — THYROID CASCADE PROFILE: TSH: 1.22 u[IU]/mL (ref 0.450–4.500)

## 2024-06-13 ENCOUNTER — Other Ambulatory Visit: Payer: Self-pay

## 2024-06-13 ENCOUNTER — Telehealth: Payer: Self-pay | Admitting: Family Medicine

## 2024-06-13 ENCOUNTER — Ambulatory Visit: Attending: Family Medicine | Admitting: Pharmacist

## 2024-06-13 ENCOUNTER — Encounter: Payer: Self-pay | Admitting: Pharmacist

## 2024-06-13 ENCOUNTER — Ambulatory Visit: Admitting: Pharmacist

## 2024-06-13 DIAGNOSIS — E1169 Type 2 diabetes mellitus with other specified complication: Secondary | ICD-10-CM

## 2024-06-13 DIAGNOSIS — Z7985 Long-term (current) use of injectable non-insulin antidiabetic drugs: Secondary | ICD-10-CM

## 2024-06-13 MED ORDER — TIRZEPATIDE 15 MG/0.5ML ~~LOC~~ SOAJ
15.0000 mg | SUBCUTANEOUS | 1 refills | Status: AC
  Filled 2024-06-13 – 2024-07-03 (×2): qty 2, 28d supply, fill #0
  Filled 2024-08-05: qty 2, 28d supply, fill #1

## 2024-06-13 NOTE — Progress Notes (Signed)
 S:     I connected with  Ruth [name] on 06/13/24 by a video enabled telemedicine application and verified that I am speaking with the correct person using two identifiers.   I discussed the limitations of evaluation and management by telemedicine. The patient expressed understanding and agreed to proceed.  Location of patient: home   Location of myself: office  Persons participating in the call: the patient and myself  No chief complaint on file.  48 y.o. female who presents for diabetes evaluation, education, and management. PMH is significant for T2DM and HLD.   I have seen her consistently since 2021. Most recently, patient was referred by Primary Care Provider, Dr. Tanda, on 01/12/2024.   When she was last seen by pharmacy on 05/02/24, we increased her Mounjaro  to 12.5 mg weekly. Of note, I also checked a lipid panel. LDL showed only slight improvement at 145 mg/dL (down from 846 mg/dL ~1 year ago).  Today, patient is in good spirits. She reports that she is tolerating the Mounjaro  12.5 mg okay. She denies vomiting, abdominal pain. She denies s/sx of hyper or hypoglycemia. She is using her Dexcom sensor which continues to show excellent glycemic control. She is interested in increasing Mounjaro  for weight loss benefit.   Family/Social History:  DM- daughter, brother, sister  No family or personal history of thyroid  cancer (mother has history of hyperthyroidism requiring radiation).   Current diabetes medications include: Mounjaro  12.5 mg weekly  Patient reports adherence to taking all medications as prescribed.  Current hyperlipidemia medications: rosuvastatin  20 mg daily  Insurance coverage: Medicaid   Patient denies hypoglycemic events.  Patient denies nocturia (nighttime urination).  Patient reports neuropathy (nerve pain). Patient denies visual changes. Patient reports self foot exams.   Dietary habits: 2 meals a day -Has increased her water  intake  since last visit. Still will drink regular soda occasionally. Unable to provide estimate of water  intake.  -snack: sandwiches, tacos  Exercise:  - None  O:  BP Readings from Last 3 Encounters:  06/07/24 (!) 150/88  05/18/24 132/88  05/02/24 124/81   Lab Results  Component Value Date   HGBA1C 5.7 05/02/2024   There were no vitals filed for this visit.  Lipid Panel     Component Value Date/Time   CHOL 219 (H) 05/02/2024 1038   TRIG 136 05/02/2024 1038   HDL 50 05/02/2024 1038   CHOLHDL 4.4 05/02/2024 1038   LDLCALC 145 (H) 05/02/2024 1038   Clinical Atherosclerotic Cardiovascular Disease (ASCVD): No  The 10-year ASCVD risk score (Arnett DK, et al., 2019) is: 19.7%   Values used to calculate the score:     Age: 6 years     Clincally relevant sex: Female     Is Non-Hispanic African American: Yes     Diabetic: Yes     Tobacco smoker: Yes     Systolic Blood Pressure: 150 mmHg     Is BP treated: No     HDL Cholesterol: 50 mg/dL     Total Cholesterol: 219 mg/dL   Patient is participating in a Managed Medicaid Plan:  Yes   A/P: Type 2 Diabetes longstanding currently controlled based on A1c of 5.7% back in October of this year. Commended her for this. Patient is tolerating Mounjaro  12.5 mg okay. I recommend to increase Mounjaro  to 15 mg weekly for now to see how she tolerates. -INCREASE Mounjaro  to 15 mg weekly once she completes this supply of 12.5 mg.  -Patient educated on  purpose, proper use, and potential adverse effects of medications.  -Extensively discussed pathophysiology of diabetes, recommended lifestyle interventions, dietary effects on blood sugar control.  -Counseled on s/sx of and management of hypoglycemia.  -Next A1c anticipated 07/2024.   Hyperlipidemia/ASCVD Risk Reduction: - Currently uncontrolled with last LDL-C of 145 mg/dL, above goal < 70 mg/dL. High intensity statin indicated. We changed her to rosuvastatin  in June with only minimal improvement in LDL.  I will look to add ezetimibe at follow-up visit.   - Continue rosuvastatin  20 mg daily.   Written patient instructions provided. Patient verbalized understanding of treatment plan.  Total time in counseling 30 minutes.    Follow-up:  Pharmacist: 6 weeks in-person  Herlene Fleeta Morris, PharmD, [place], CPP Clinical Pharmacist [facility] [phone]

## 2024-06-13 NOTE — Telephone Encounter (Addendum)
 Contacted the patient and scheduled a telephone visit for today at 2:00 PM.

## 2024-06-13 NOTE — Telephone Encounter (Signed)
 Copied from CRM [id number]. Topic: Appointments - Scheduling Inquiry for Clinic >> Jun 13, 2024  8:20 AM Anairis L wrote:  Reason for CRM: Patient is requesting for her 10:30 and visit to be changed to virtual due to weather/ Ride.   Please give patient a call.

## 2024-06-22 ENCOUNTER — Other Ambulatory Visit: Payer: Self-pay

## 2024-07-03 ENCOUNTER — Other Ambulatory Visit: Payer: Self-pay

## 2024-07-03 ENCOUNTER — Other Ambulatory Visit: Payer: Self-pay | Admitting: Family Medicine

## 2024-07-03 MED ORDER — ROSUVASTATIN CALCIUM 20 MG PO TABS
20.0000 mg | ORAL_TABLET | Freq: Every day | ORAL | 1 refills | Status: AC
  Filled 2024-07-03: qty 90, 90d supply, fill #0

## 2024-07-05 ENCOUNTER — Other Ambulatory Visit: Payer: Self-pay

## 2024-07-05 ENCOUNTER — Other Ambulatory Visit: Payer: Self-pay | Admitting: Family Medicine

## 2024-07-05 DIAGNOSIS — E1169 Type 2 diabetes mellitus with other specified complication: Secondary | ICD-10-CM

## 2024-07-06 MED ORDER — DEXCOM G7 SENSOR MISC
2 refills | Status: AC
  Filled 2024-07-06: qty 3, 30d supply, fill #0
  Filled 2024-08-05: qty 3, 30d supply, fill #1

## 2024-07-07 ENCOUNTER — Other Ambulatory Visit: Payer: Self-pay

## 2024-07-10 ENCOUNTER — Other Ambulatory Visit: Payer: Self-pay

## 2024-07-10 ENCOUNTER — Other Ambulatory Visit: Payer: Self-pay | Admitting: Family Medicine

## 2024-07-11 ENCOUNTER — Other Ambulatory Visit: Payer: Self-pay

## 2024-07-13 NOTE — Telephone Encounter (Signed)
 Please refill if appropriate

## 2024-07-14 ENCOUNTER — Other Ambulatory Visit: Payer: Self-pay

## 2024-07-14 MED ORDER — ONDANSETRON HCL 4 MG PO TABS
4.0000 mg | ORAL_TABLET | Freq: Three times a day (TID) | ORAL | 0 refills | Status: AC | PRN
  Filled 2024-07-14: qty 20, 7d supply, fill #0

## 2024-08-05 ENCOUNTER — Other Ambulatory Visit: Payer: Self-pay | Admitting: Family Medicine

## 2024-08-06 ENCOUNTER — Other Ambulatory Visit: Payer: Self-pay

## 2024-08-07 ENCOUNTER — Other Ambulatory Visit: Payer: Self-pay

## 2024-08-09 ENCOUNTER — Other Ambulatory Visit: Payer: Self-pay

## 2024-08-09 MED ORDER — IRON (FERROUS SULFATE) 325 (65 FE) MG PO TABS
325.0000 mg | ORAL_TABLET | Freq: Every day | ORAL | 3 refills | Status: AC
  Filled 2024-08-09: qty 30, 30d supply, fill #0

## 2024-08-17 ENCOUNTER — Ambulatory Visit: Admitting: Allergy & Immunology

## 2024-08-22 ENCOUNTER — Ambulatory Visit: Admitting: Podiatry

## 2025-01-24 ENCOUNTER — Ambulatory Visit: Admitting: Physician Assistant
